# Patient Record
Sex: Female | Born: 1970 | Race: White | Hispanic: No | Marital: Married | Smoking: Former smoker
Health system: Southern US, Community
[De-identification: ages and names within clinical notes are randomized; demographics above are authoritative.]

## PROBLEM LIST (undated history)

## (undated) DIAGNOSIS — I10 Essential (primary) hypertension: Secondary | ICD-10-CM

## (undated) DIAGNOSIS — C50919 Malignant neoplasm of unspecified site of unspecified female breast: Secondary | ICD-10-CM

## (undated) DIAGNOSIS — I2510 Atherosclerotic heart disease of native coronary artery without angina pectoris: Secondary | ICD-10-CM

## (undated) DIAGNOSIS — K5792 Diverticulitis of intestine, part unspecified, without perforation or abscess without bleeding: Secondary | ICD-10-CM

## (undated) DIAGNOSIS — K449 Diaphragmatic hernia without obstruction or gangrene: Secondary | ICD-10-CM

## (undated) DIAGNOSIS — I447 Left bundle-branch block, unspecified: Secondary | ICD-10-CM

## (undated) DIAGNOSIS — F419 Anxiety disorder, unspecified: Secondary | ICD-10-CM

## (undated) DIAGNOSIS — E785 Hyperlipidemia, unspecified: Secondary | ICD-10-CM

## (undated) DIAGNOSIS — J189 Pneumonia, unspecified organism: Secondary | ICD-10-CM

## (undated) DIAGNOSIS — K219 Gastro-esophageal reflux disease without esophagitis: Secondary | ICD-10-CM

## (undated) DIAGNOSIS — E039 Hypothyroidism, unspecified: Secondary | ICD-10-CM

## (undated) DIAGNOSIS — T7840XA Allergy, unspecified, initial encounter: Secondary | ICD-10-CM

## (undated) DIAGNOSIS — E119 Type 2 diabetes mellitus without complications: Secondary | ICD-10-CM

## (undated) HISTORY — PX: TUBAL LIGATION: SHX77

## (undated) HISTORY — DX: Hyperlipidemia, unspecified: E78.5

## (undated) HISTORY — DX: Diaphragmatic hernia without obstruction or gangrene: K44.9

## (undated) HISTORY — DX: Pneumonia, unspecified organism: J18.9

## (undated) HISTORY — PX: WISDOM TOOTH EXTRACTION: SHX21

## (undated) HISTORY — DX: Allergy, unspecified, initial encounter: T78.40XA

## (undated) HISTORY — PX: OTHER SURGICAL HISTORY: SHX169

## (undated) HISTORY — DX: Gastro-esophageal reflux disease without esophagitis: K21.9

## (undated) HISTORY — DX: Diverticulitis of intestine, part unspecified, without perforation or abscess without bleeding: K57.92

---

## 2003-07-14 ENCOUNTER — Encounter: Admission: RE | Admit: 2003-07-14 | Discharge: 2003-07-14 | Payer: Self-pay | Admitting: Internal Medicine

## 2003-07-14 IMAGING — CR DG CHEST 2V
2 series · 2 of 2 positions shown · non-contrast
Comparison: none

CLINICAL DATA: Chest pain.
 TWO VIEW CHEST 
 The heart size and mediastinal contours are unremarkable.  The lungs are clear.  The visualized skeleton is unremarkable.
 IMPRESSION
 No active disease.

[view not recorded (1 of 2)]
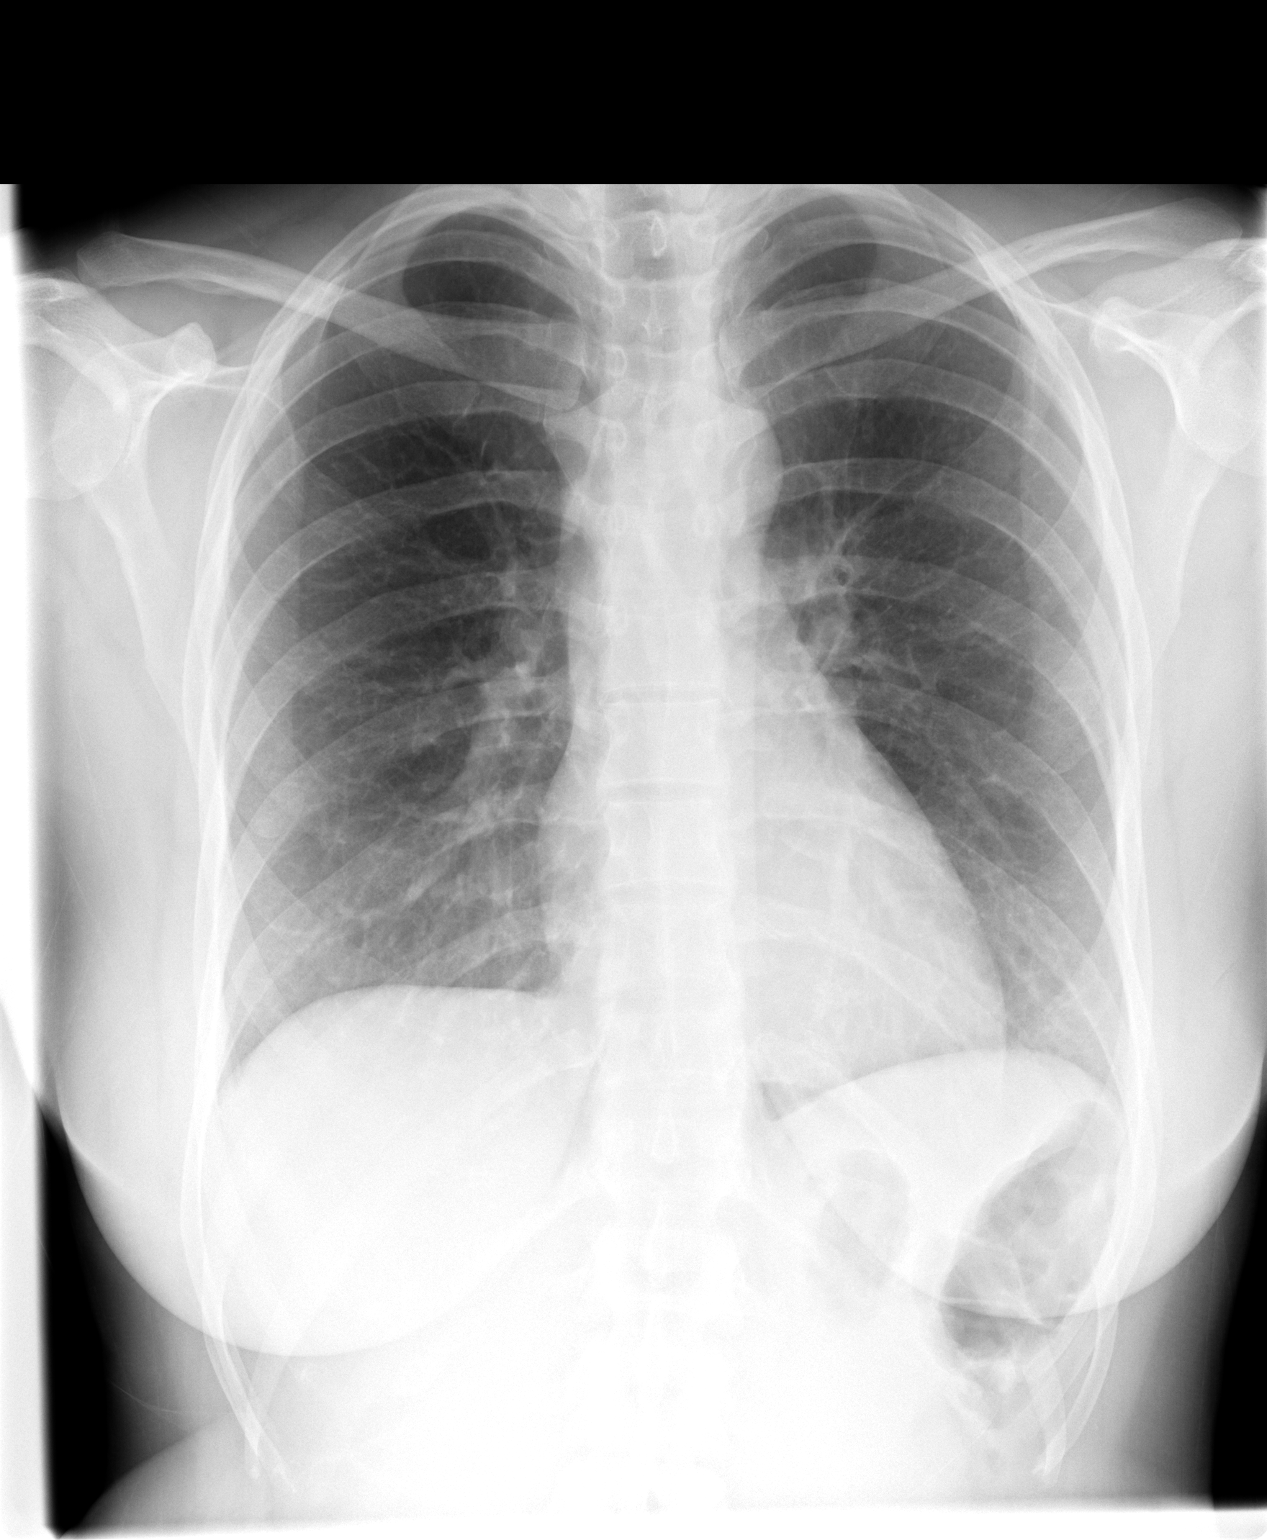

[view not recorded (2 of 2)]
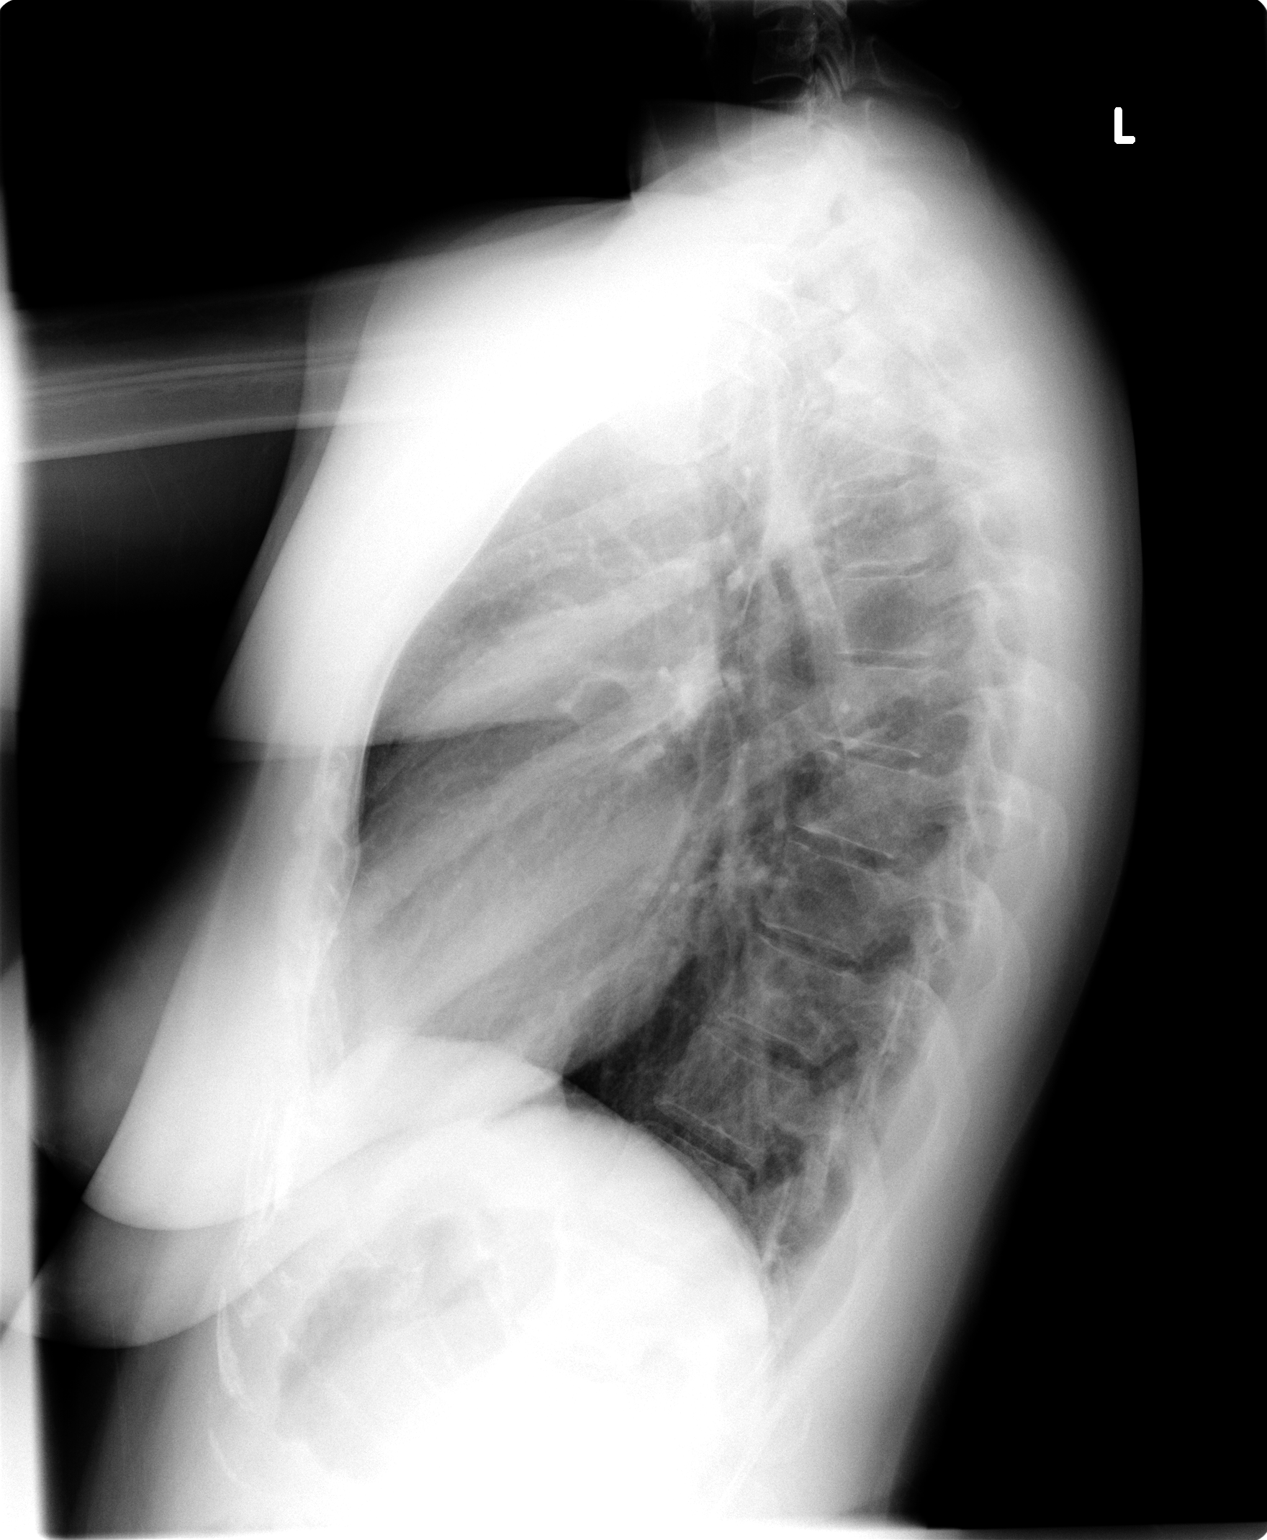

[2 of 2 positions shown; findings below may reference images not displayed]

## 2005-05-02 ENCOUNTER — Other Ambulatory Visit: Admission: RE | Admit: 2005-05-02 | Discharge: 2005-05-02 | Payer: Self-pay | Admitting: Obstetrics and Gynecology

## 2005-12-12 ENCOUNTER — Inpatient Hospital Stay (HOSPITAL_COMMUNITY): Admission: RE | Admit: 2005-12-12 | Discharge: 2005-12-15 | Payer: Self-pay | Admitting: Obstetrics and Gynecology

## 2007-04-09 ENCOUNTER — Encounter: Admission: RE | Admit: 2007-04-09 | Discharge: 2007-04-09 | Payer: Self-pay | Admitting: Obstetrics and Gynecology

## 2007-04-09 IMAGING — MG MM DIAGNOSTIC BILATERAL
4 series · 4 of 4 positions shown · non-contrast
Comparison: None.

DG DIAGNOSTIC BILATERAL
Bilateral CC and MLO view(s) were taken.

RIGHT BREAST ULTRASOUND
DIGITAL BILATERAL DIAGNOSTIC MAMMOGRAM WITH CAD AND RIGHT BREAST ULTRASOUND:
CLINICAL DATA: Pain and lump in the right breast.

[R CC]
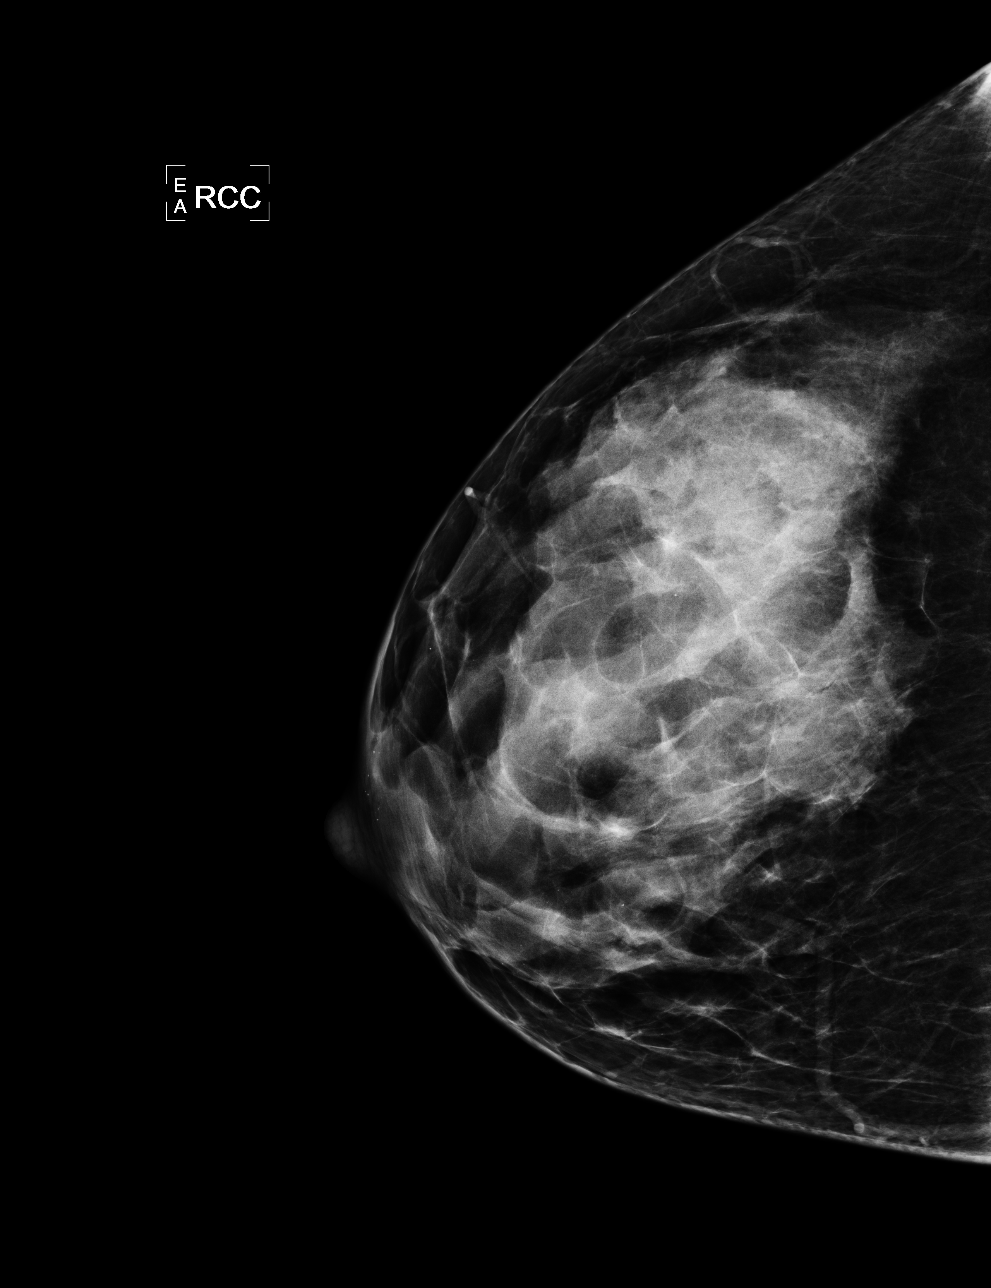

[L CC]
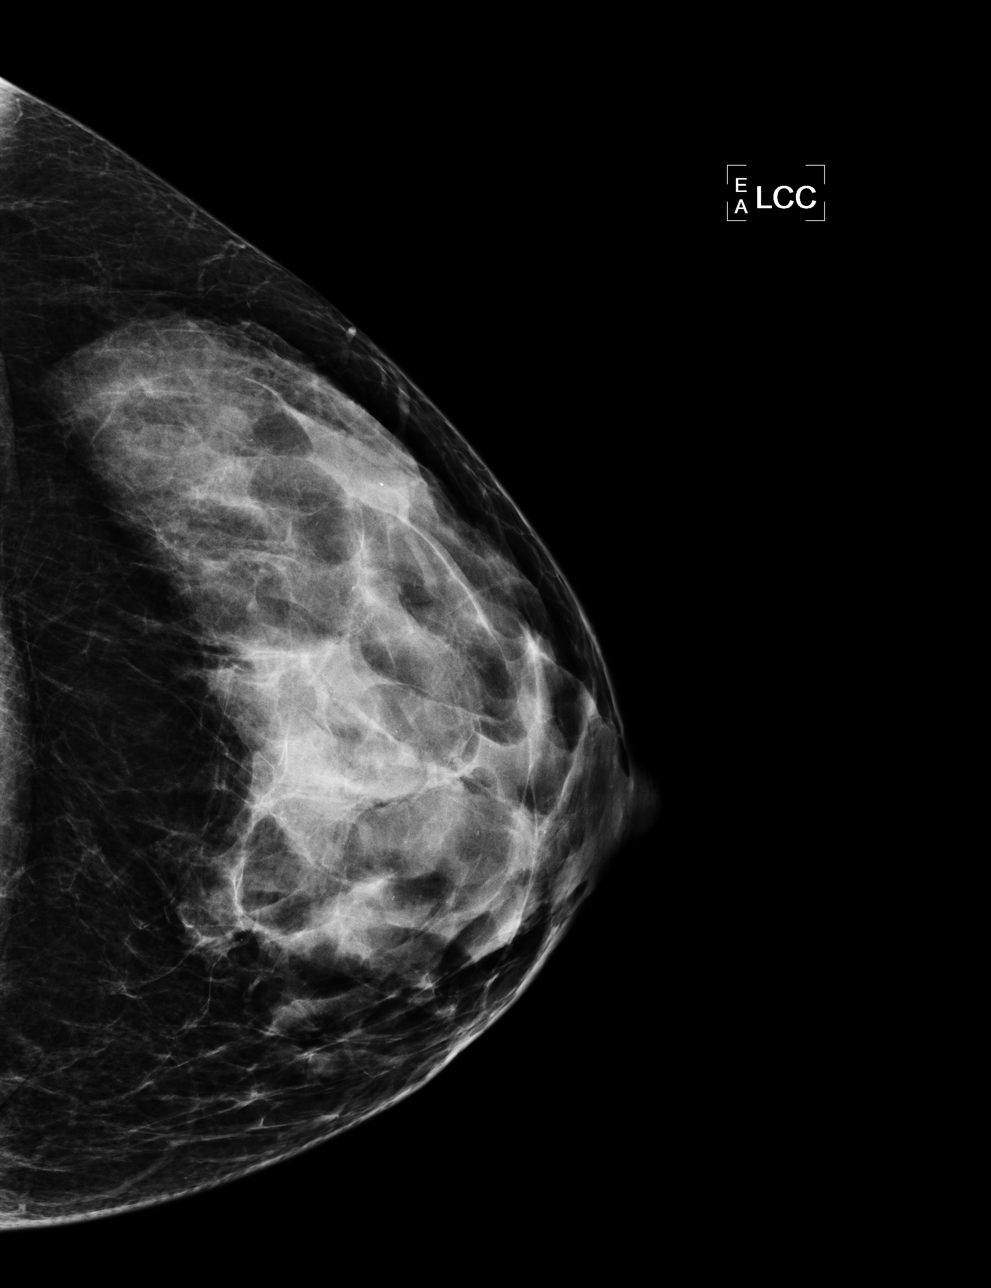

[L MLO]
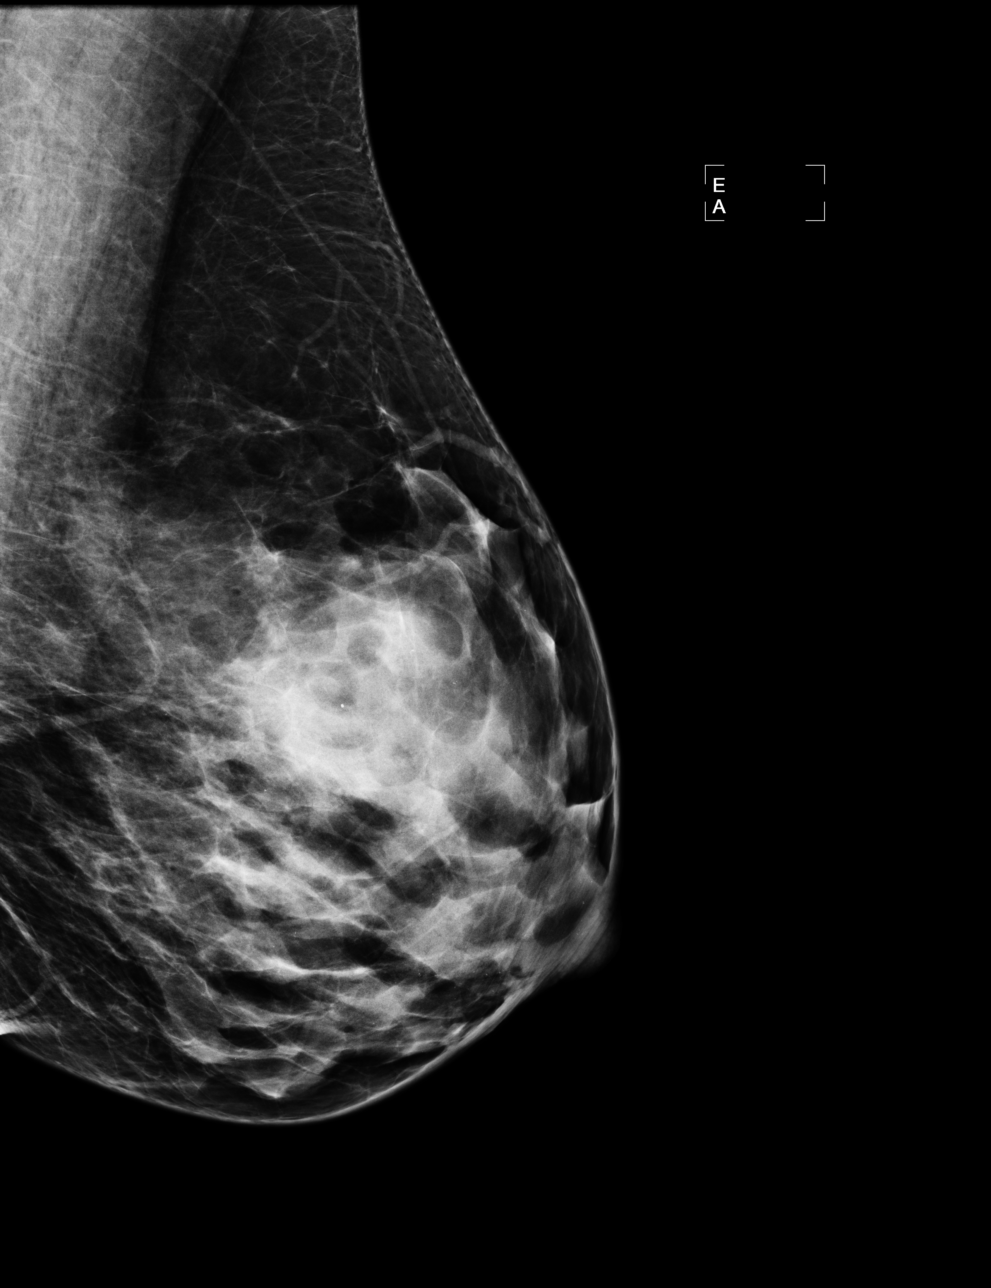

[R MLO]
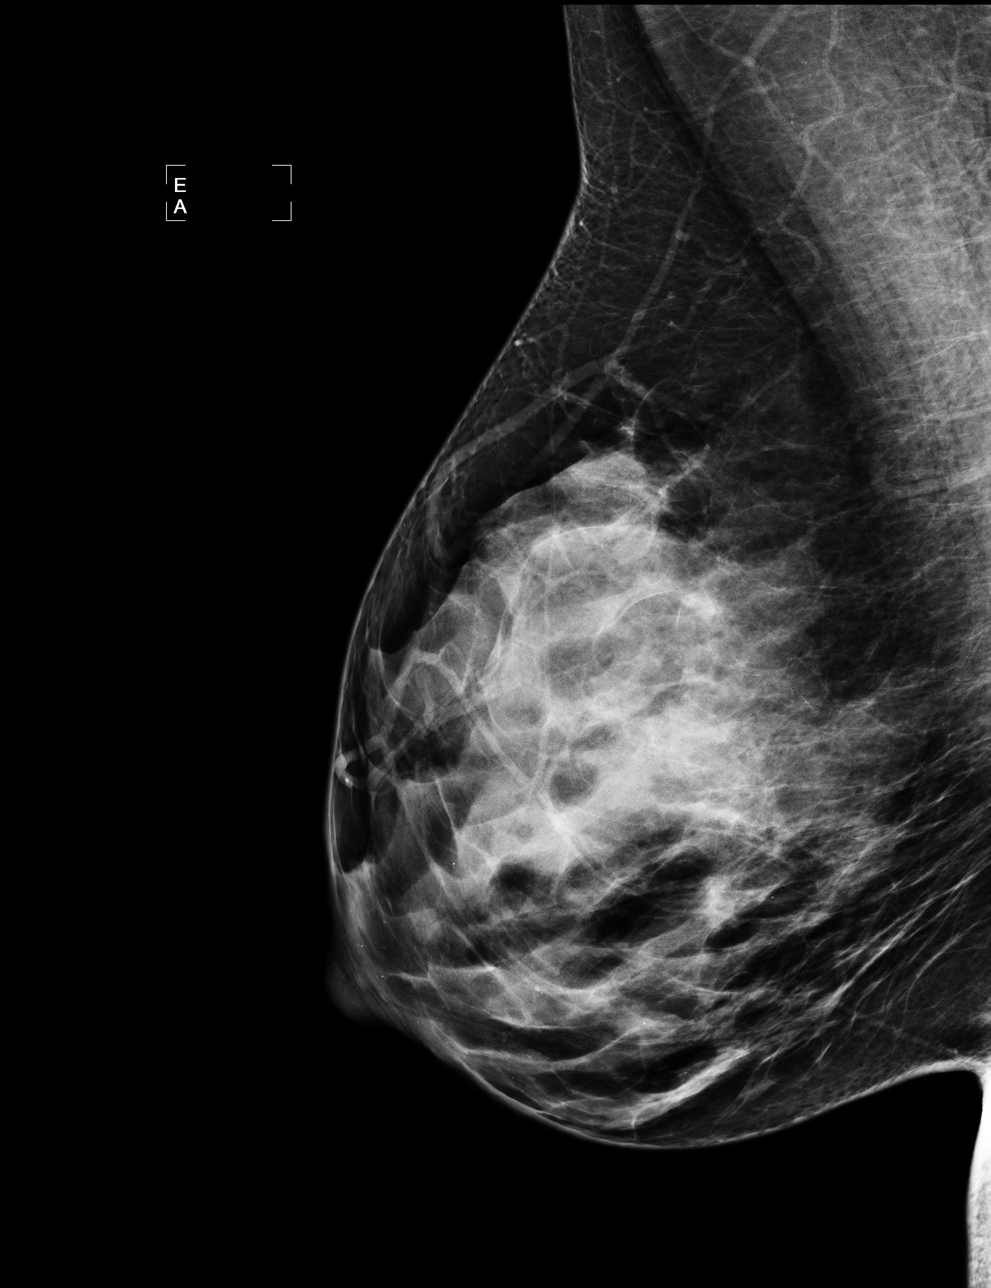

[4 of 4 positions shown; findings below may reference images not displayed]

CC and MLO views of bilateral breasts are submitted.  Breast parenchyma is heterogeneously dense 
lowering the sensitivity of mammography.  No focal asymmetry, architectural distortion or 
suspicious microcalcifications are identified in both breasts.

Targeted limited ultrasound is performed in the palpable area in the right breast 12 o'clock 
periareolar region as well as the area of pain in the upper outer quadrant right breast which 
demonstrated no focal cystic or solid lesion.
IMPRESSION: Negative.  Recommend routine screening mammogram at age 39. Recommend management on a clinical 
basis for the patient's right breast pain.

ASSESSMENT: Negative - BI-RADS 1

Screening mammogram of both breasts at age 39.
,

## 2007-12-28 ENCOUNTER — Encounter: Payer: Self-pay | Admitting: Cardiovascular Disease

## 2007-12-30 ENCOUNTER — Encounter: Payer: Self-pay | Admitting: Cardiovascular Disease

## 2007-12-30 HISTORY — PX: US ECHOCARDIOGRAPHY: HXRAD669

## 2008-03-29 HISTORY — PX: OTHER SURGICAL HISTORY: SHX169

## 2008-04-26 ENCOUNTER — Encounter (INDEPENDENT_AMBULATORY_CARE_PROVIDER_SITE_OTHER): Payer: Self-pay | Admitting: Plastic Surgery

## 2008-04-26 ENCOUNTER — Ambulatory Visit (HOSPITAL_BASED_OUTPATIENT_CLINIC_OR_DEPARTMENT_OTHER): Admission: RE | Admit: 2008-04-26 | Discharge: 2008-04-26 | Payer: Self-pay | Admitting: Plastic Surgery

## 2008-06-13 ENCOUNTER — Emergency Department (HOSPITAL_BASED_OUTPATIENT_CLINIC_OR_DEPARTMENT_OTHER): Admission: EM | Admit: 2008-06-13 | Discharge: 2008-06-13 | Payer: Self-pay | Admitting: Emergency Medicine

## 2008-08-14 ENCOUNTER — Emergency Department (HOSPITAL_BASED_OUTPATIENT_CLINIC_OR_DEPARTMENT_OTHER): Admission: EM | Admit: 2008-08-14 | Discharge: 2008-08-14 | Payer: Self-pay | Admitting: Emergency Medicine

## 2008-08-15 ENCOUNTER — Ambulatory Visit: Payer: Self-pay | Admitting: Diagnostic Radiology

## 2008-08-15 ENCOUNTER — Emergency Department (HOSPITAL_BASED_OUTPATIENT_CLINIC_OR_DEPARTMENT_OTHER): Admission: EM | Admit: 2008-08-15 | Discharge: 2008-08-15 | Payer: Self-pay | Admitting: Emergency Medicine

## 2008-08-15 IMAGING — US US ABDOMEN COMPLETE
1 series · 14 of 25 positions shown · non-contrast
Comparison: None available.

CLINICAL DATA: Epigastric pain.  Left side chest pain.

COMPLETE ABDOMINAL ULTRASOUND

[Series 1: us abdomen complete · 0.32mm/px · 14 of 84 slices shown]
[im 1/84]
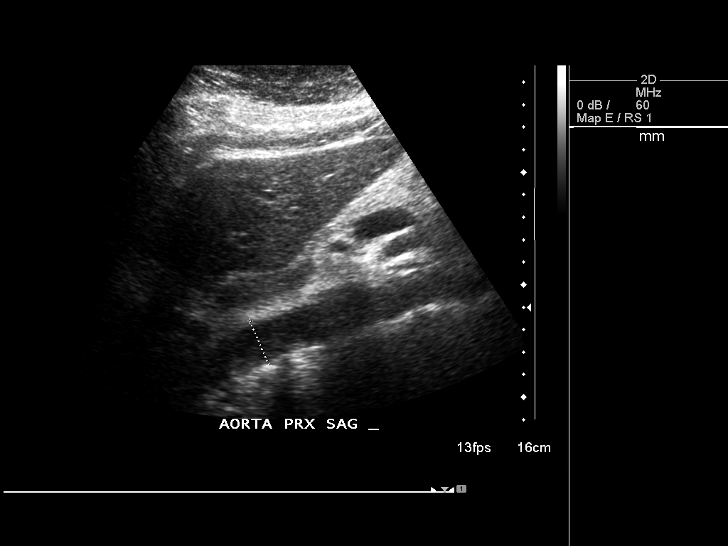
[im 7/84]
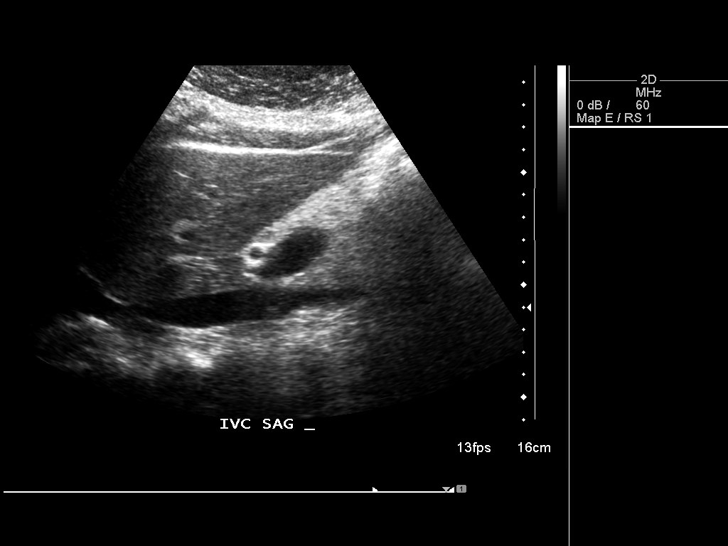
[im 14/84]
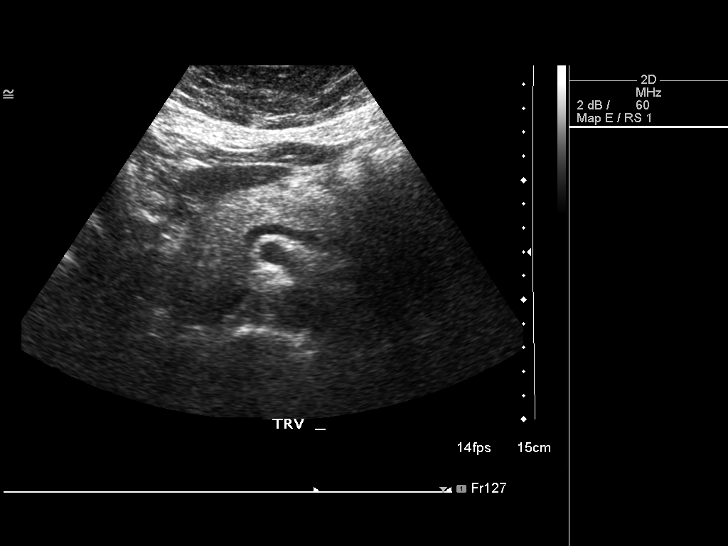
[im 21/84]
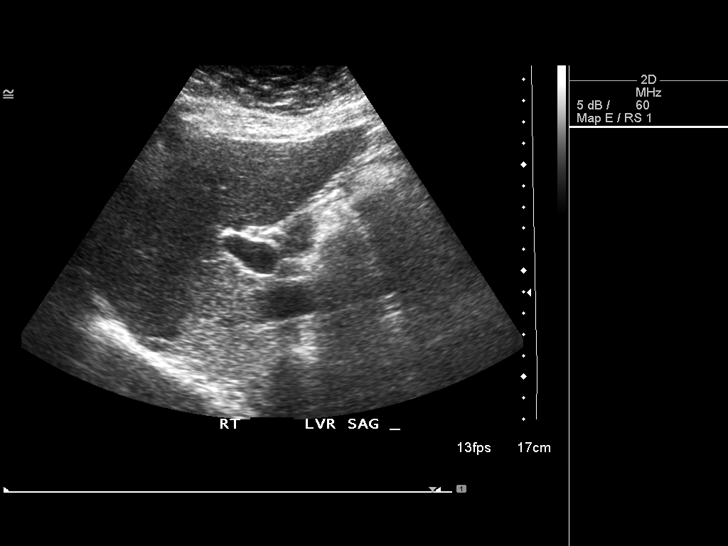
[im 28/84]
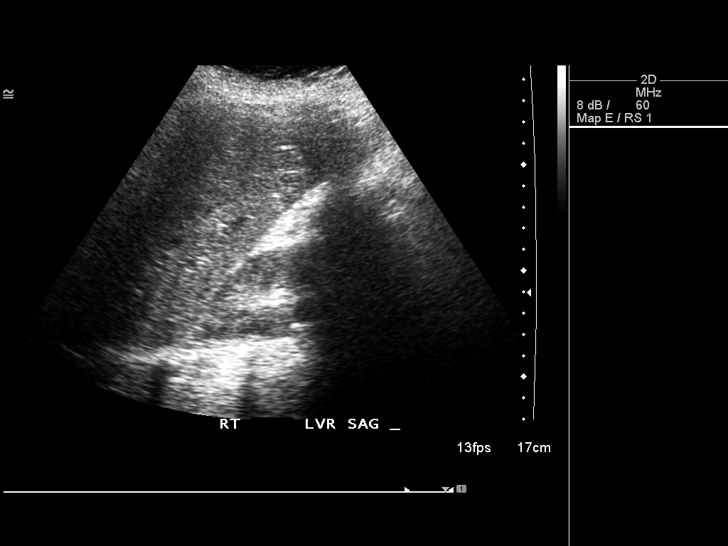
[im 32/84]
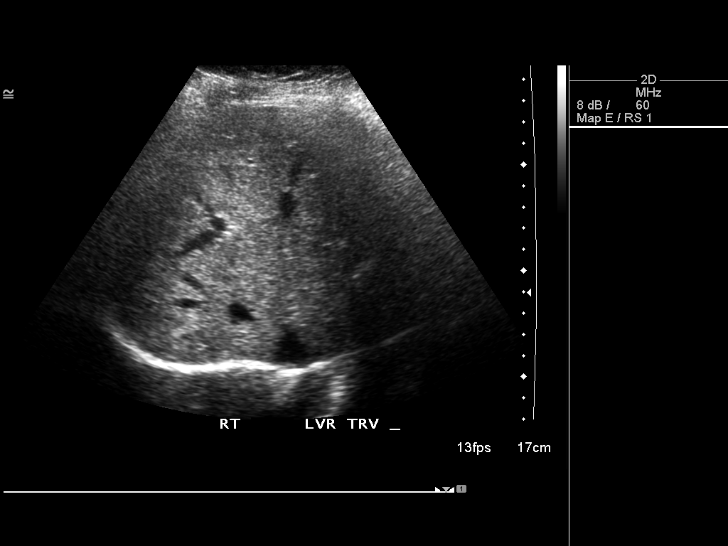
[im 39/84]
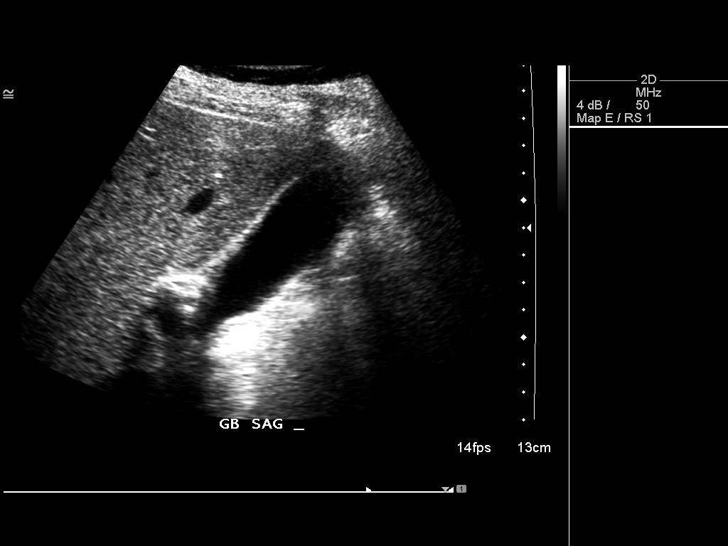
[im 45/84]
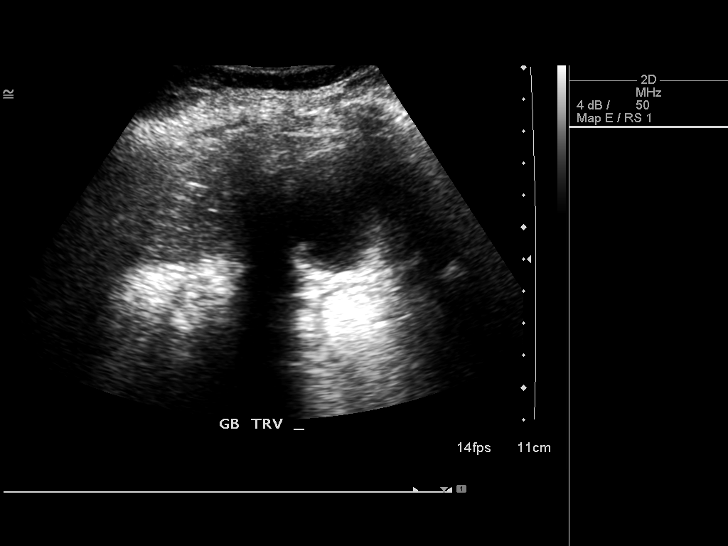
[im 52/84]
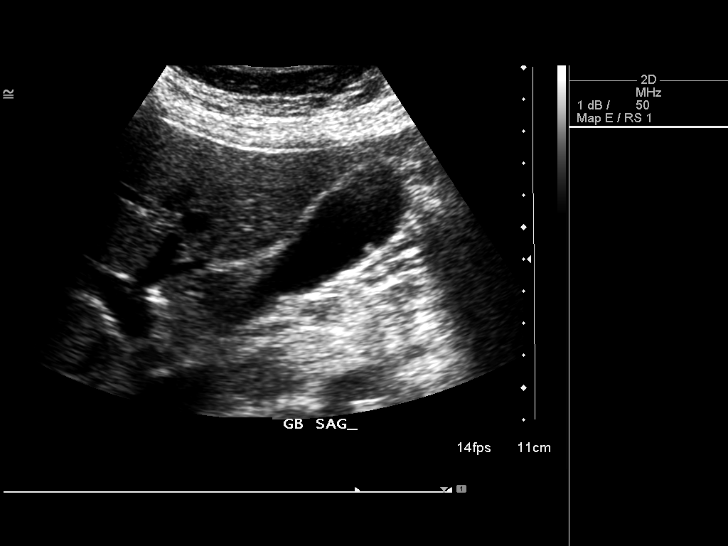
[im 56/84]
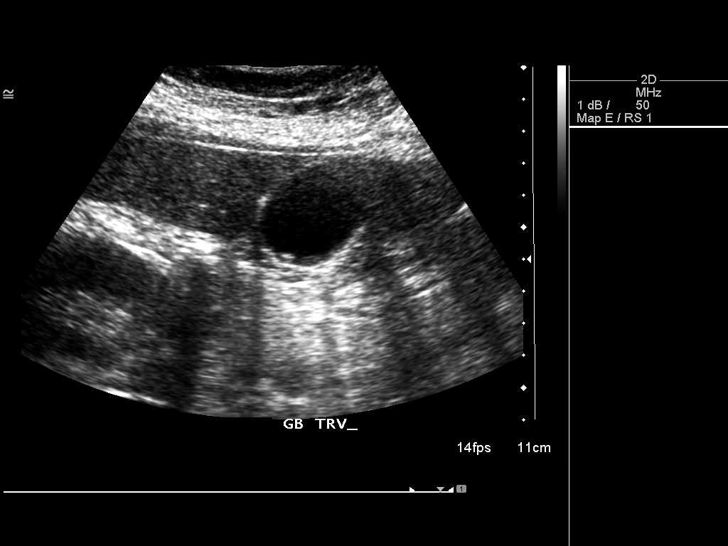
[im 63/84]
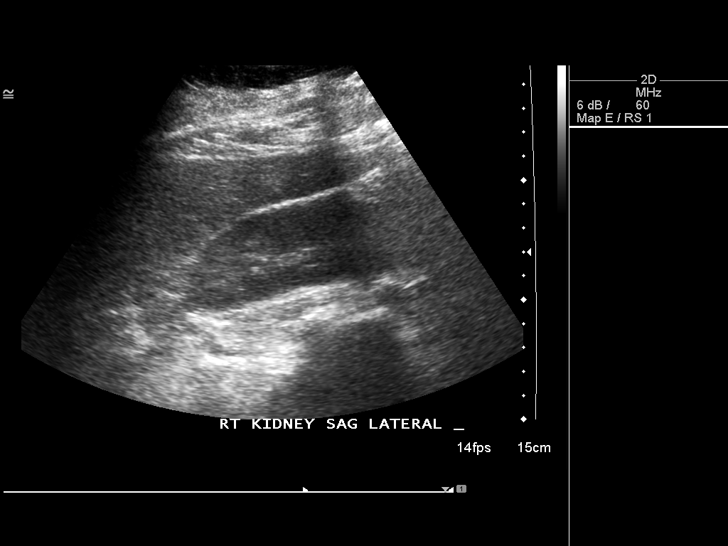
[im 70/84]
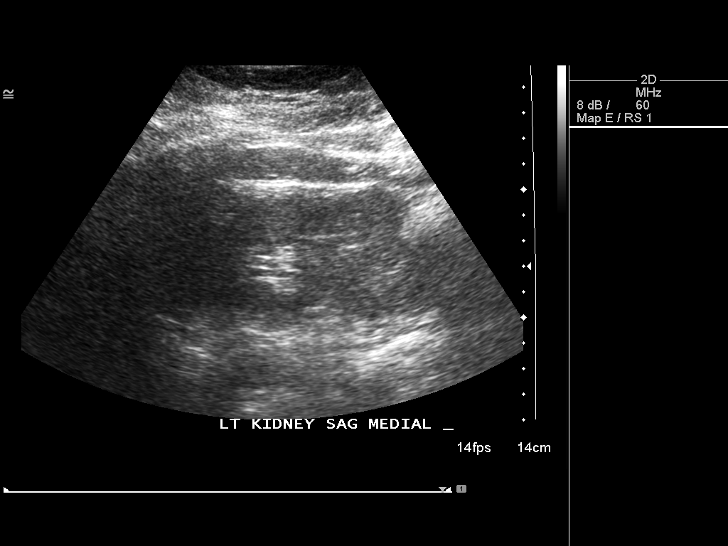
[im 77/84]
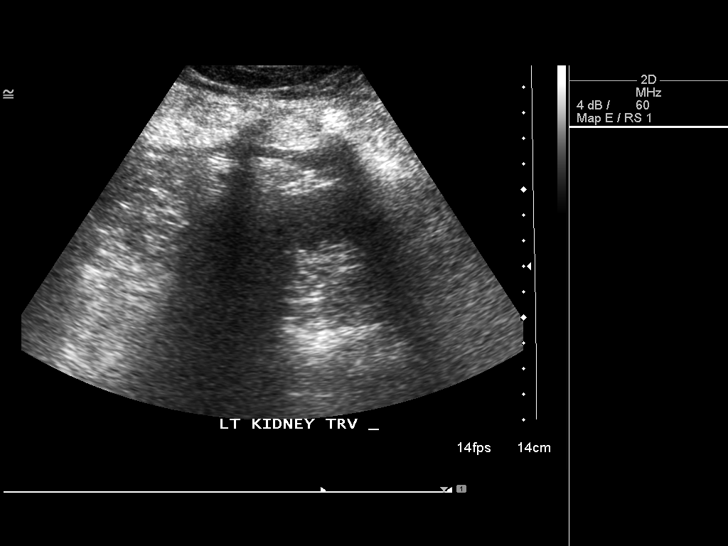
[im 84/84]
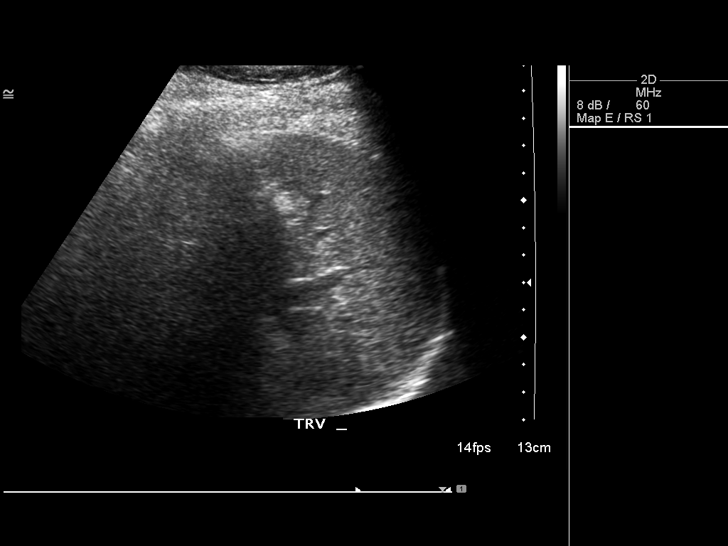

[14 of 25 positions shown; findings below may reference images not displayed]

FINDINGS: Gallbladder:  A 0.4 cm hyperechoic structure along the posterior
wall of the gallbladder near the fundus is compatible with a polyp.
No stones, wall thickening or pericholecystic fluid is identified.
Sonographer reports negative Murphy's sign.

Common bile duct:  Normal measuring 2.9 mm.

Liver:  Normal.

IVC:  Normal.

Pancreas:  Normal.

Spleen:  Normal measures 10.4 cm.

Right Kidney:  Measures 11.5 cm.  No stones, mass or
hydronephrosis.

Left Kidney:  Measures 10.9 cm.  No stones, mass or hydronephrosis.

Abdominal aorta:  Maximal diameter 2.2 cm.  No aneurysm.
IMPRESSION: 1.  0.4 cm gallbladder polyp is noted.  There is no evidence of
cholecystitis and the patient's examination is otherwise negative.

## 2008-08-15 IMAGING — CT CT ANGIO CHEST
2 of 6 series · 18 of 36 positions shown · IV contrast (APPLIED)
Comparison: Chest radiograph [DATE].

CLINICAL DATA: 37-year-old female with chest pain.  Elevated D-
dimer.  The patient is 18 weeks pregnant, and was shielded for the
exam.

CT ANGIOGRAPHY CHEST
TECHNIQUE: Multidetector CT imaging of the chest was performed
using the standard protocol during bolus administration of
intravenous contrast. Multiplanar CT image reconstructions
including MIPs were obtained to evaluate the vascular anatomy.
Contrast: 80 ml [UD].

[Series 5: pe 1.0 b25f · axial · 0.63mm/px · z∈[-231,-28]mm · 17 of 227 slices shown]
[im 12/227  lung]
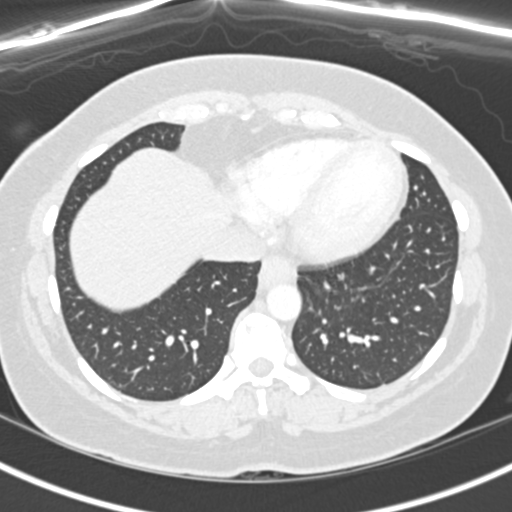
[im 23/227  mediastinal]
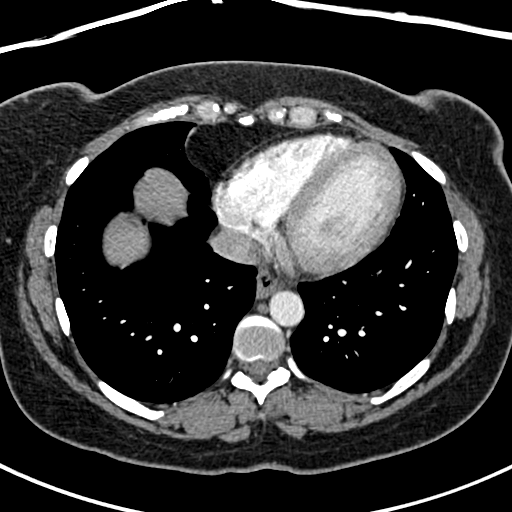
[im 34/227  lung]
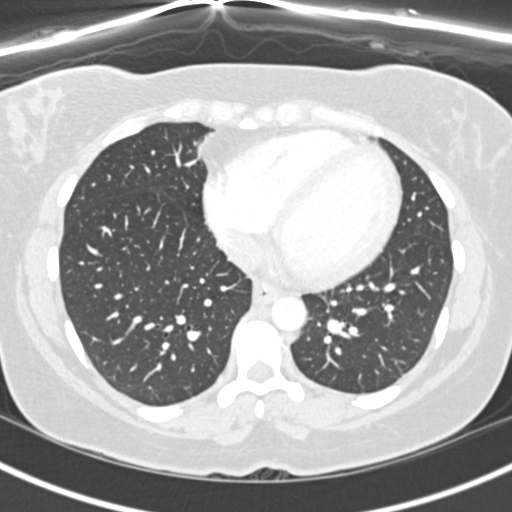
[im 46/227  mediastinal]
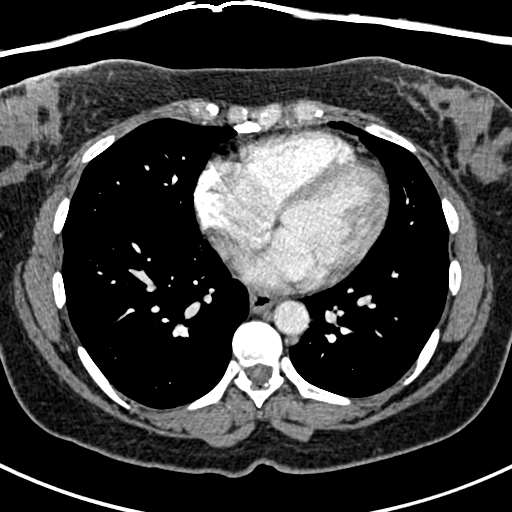
[im 68/227  lung]
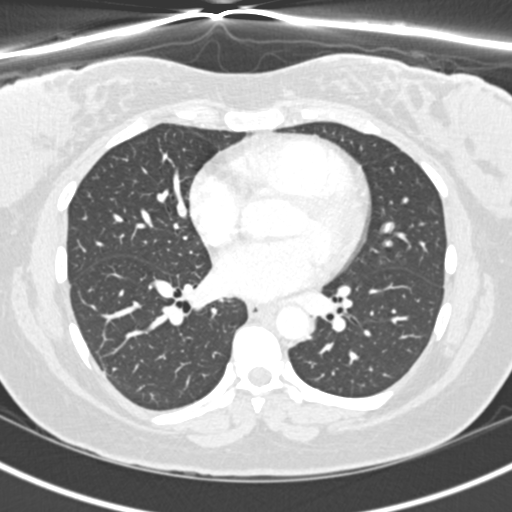
[im 80/227  mediastinal]
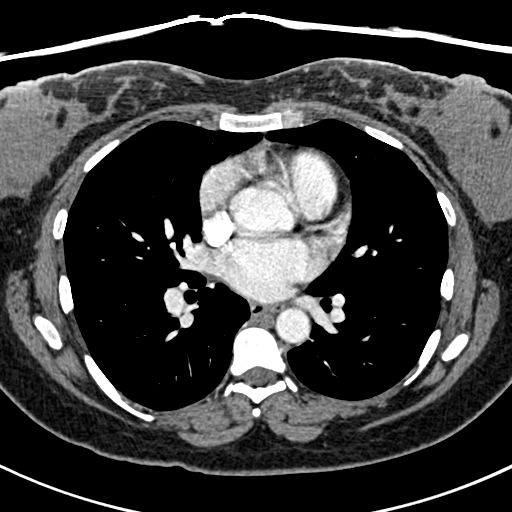
[im 91/227  lung]
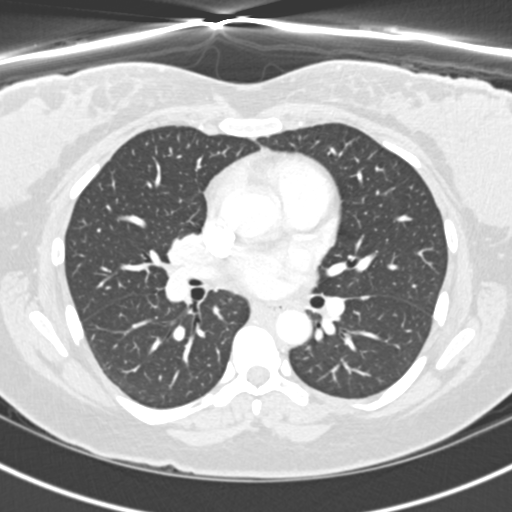
[im 102/227  mediastinal]
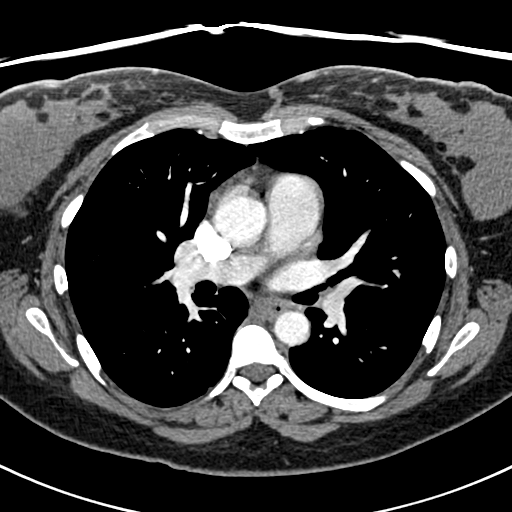
[im 114/227  lung]
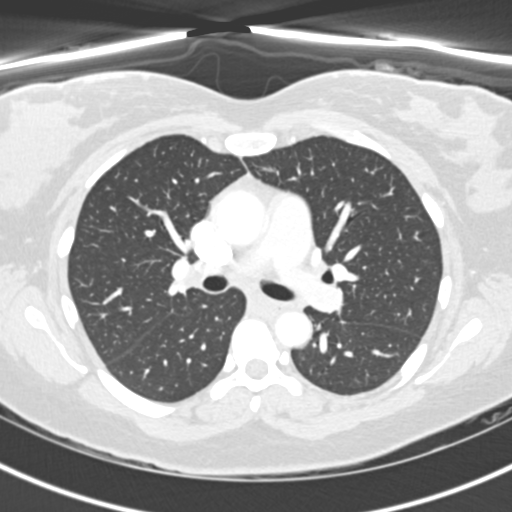
[im 125/227  mediastinal]
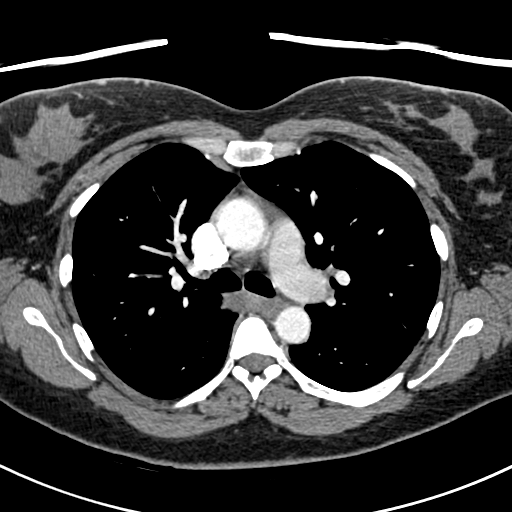
[im 136/227  lung]
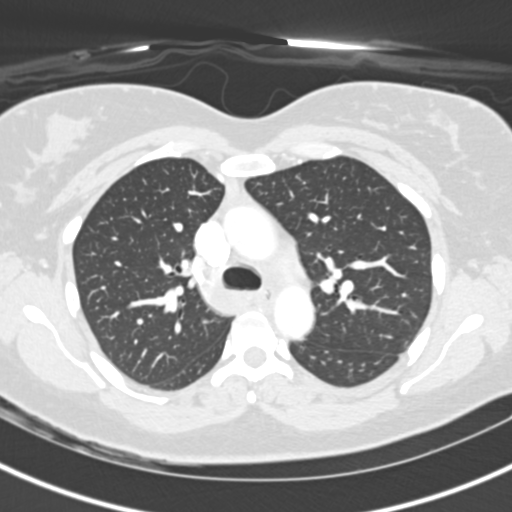
[im 147/227  mediastinal]
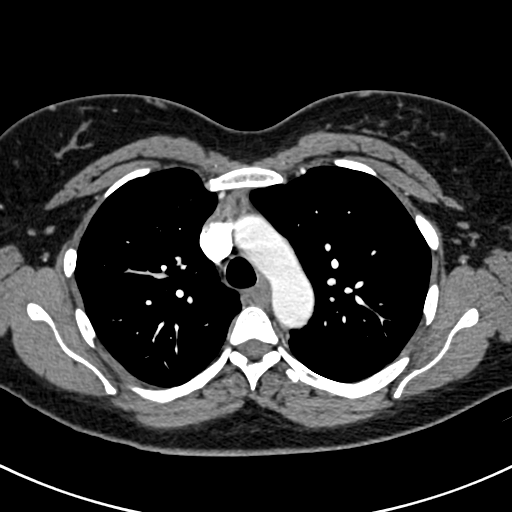
[im 159/227  lung]
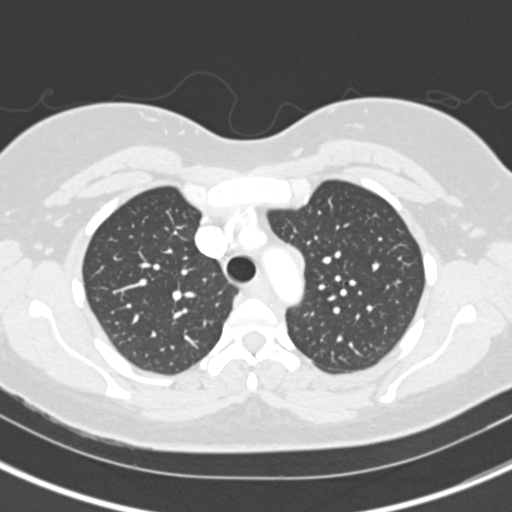
[im 181/227  mediastinal]
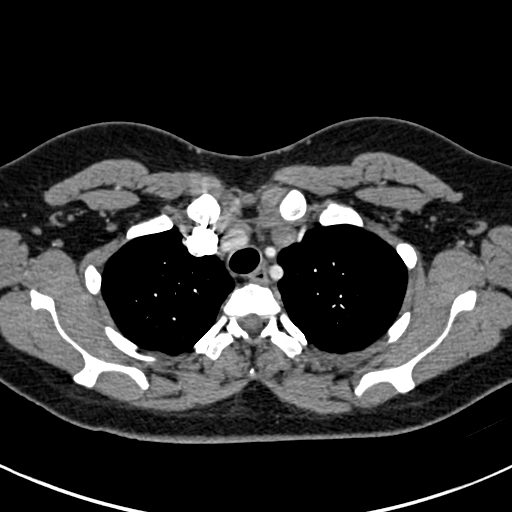
[im 193/227  lung]
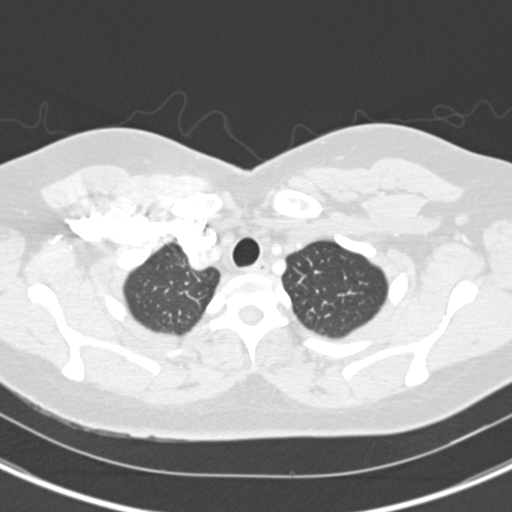
[im 204/227  mediastinal]
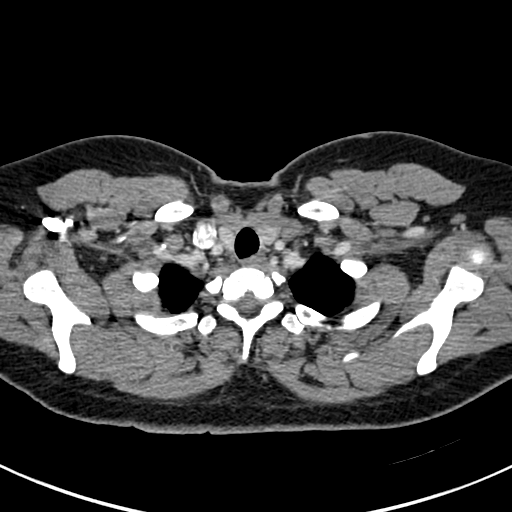
[im 215/227  lung]
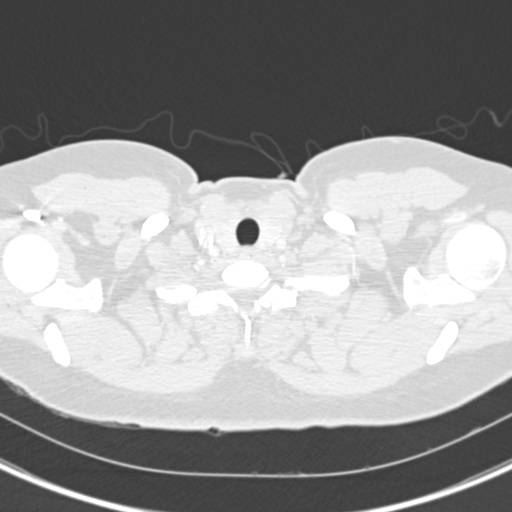

[Series 8: pe 2.0 coronal · coronal · 0.46mm/px · 1 of 123 slices shown]
[im 62/123  mediastinal]
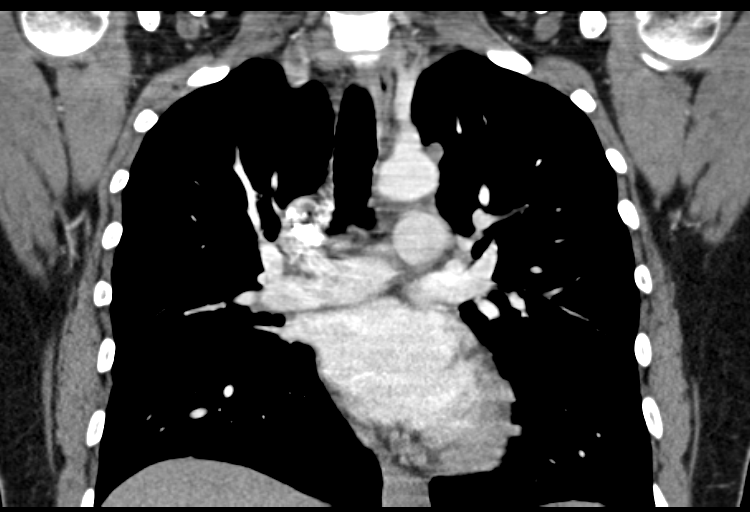

[18 of 36 positions shown; findings below may reference images not displayed]

FINDINGS: Suboptimal contrast bolus timing in the pulmonary
arterial tree.  Overall, diagnostic quality is satisfactory,
without respiratory motion artifact.No focal filling defect
identified in the pulmonary arterial tree to suggest the presence
of acute pulmonary embolism.

Major airways are patent.  Probably incidental 2-3 mm subpleural
nodule in the right upper lobe on series 6 image 44.  No airspace
consolidation.  Minor dependent atelectasis.  Otherwise, visualized
lung parenchyma is within normal limits.

Visualized thoracic inlet is within normal limits.  No pericardial
or pleural effusion.  No lymphadenopathy.  Visualized aorta is
within normal limits.

No acute osseous abnormality identified.

 Review of the MIP images confirms the above findings.
IMPRESSION: 1. No evidence of acute pulmonary embolus.
2. No acute cardiopulmonary abnormality.
3.  Likely incidental right upper lobe subpleural 2-3 mm nodule.
If the patient is at high risk for bronchogenic carcinoma, follow-
up chest CT at 1 year is recommended.  If the patient is at low
risk, no follow-up is needed.  This recommendation follows the
consensus statement: "Guidelines for Management of Small Pulmonary
Nodules Detected on CT Scans:  A Statement from the [employer]
online at:  [URL]

## 2008-12-29 ENCOUNTER — Inpatient Hospital Stay (HOSPITAL_COMMUNITY): Admission: AD | Admit: 2008-12-29 | Discharge: 2009-01-04 | Payer: Self-pay | Admitting: Obstetrics and Gynecology

## 2008-12-30 ENCOUNTER — Encounter (INDEPENDENT_AMBULATORY_CARE_PROVIDER_SITE_OTHER): Payer: Self-pay | Admitting: Obstetrics and Gynecology

## 2009-09-27 ENCOUNTER — Encounter: Admission: RE | Admit: 2009-09-27 | Discharge: 2009-09-27 | Payer: Self-pay | Admitting: Internal Medicine

## 2009-09-27 IMAGING — US US ABDOMEN COMPLETE
1 series · 14 of 25 positions shown · non-contrast
Comparison: [DATE]

CLINICAL DATA: Follow-up gallbladder polyp.

COMPLETE ABDOMINAL ULTRASOUND

[Series 1: us abdomen complete · 0.39mm/px · 14 of 68 slices shown]
[im 1/68]
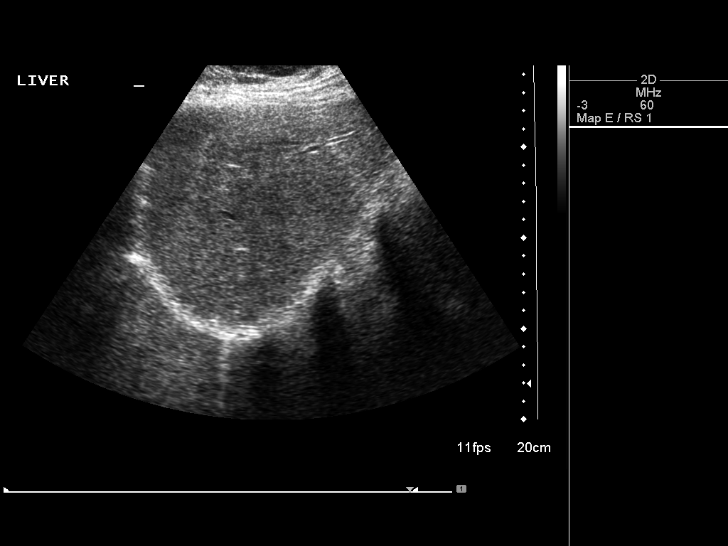
[im 6/68]
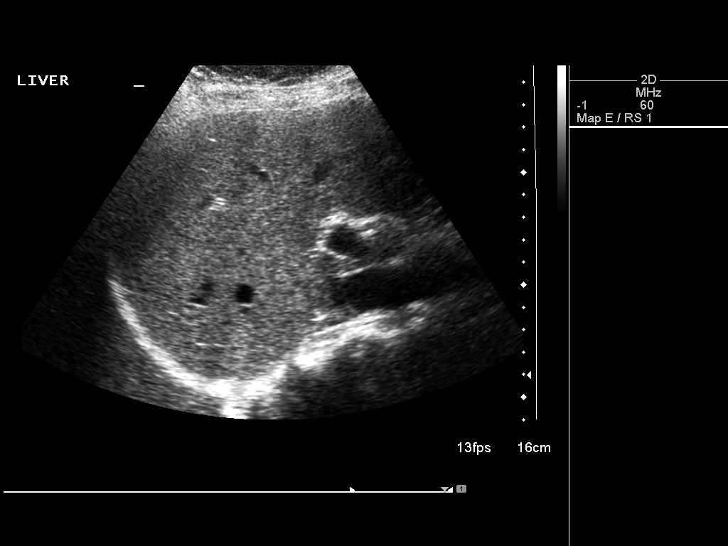
[im 12/68]
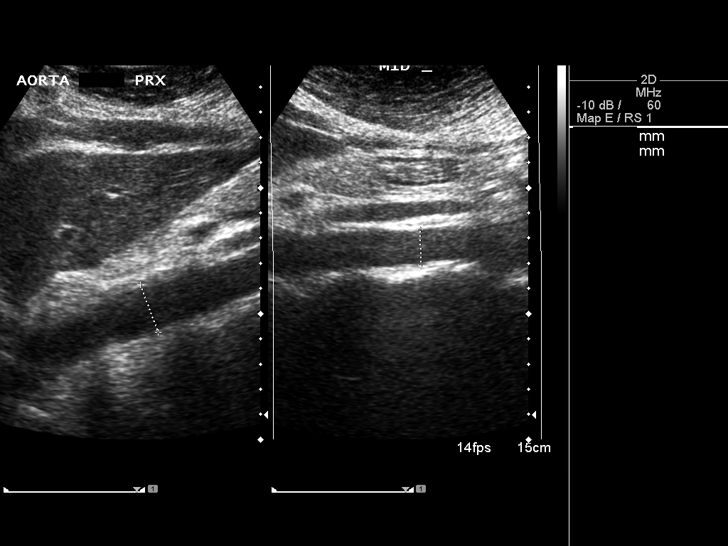
[im 17/68]
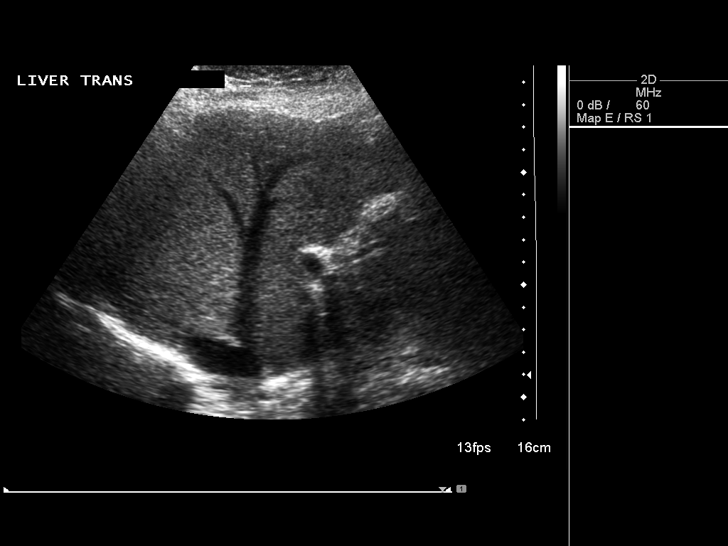
[im 23/68]
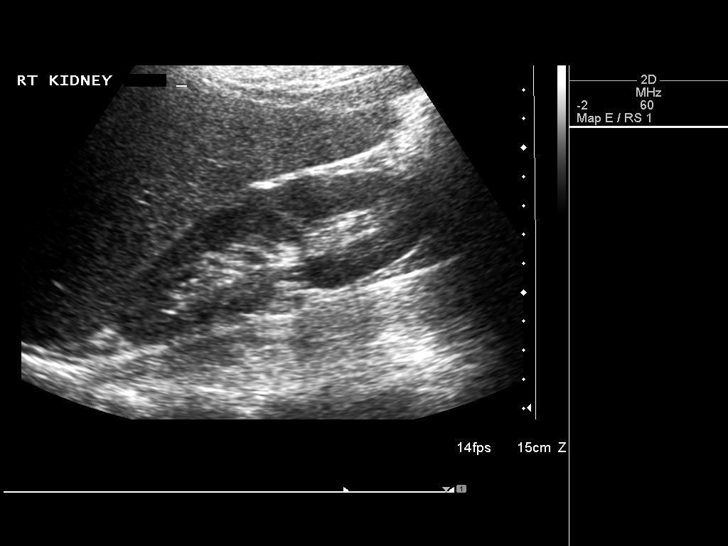
[im 26/68]
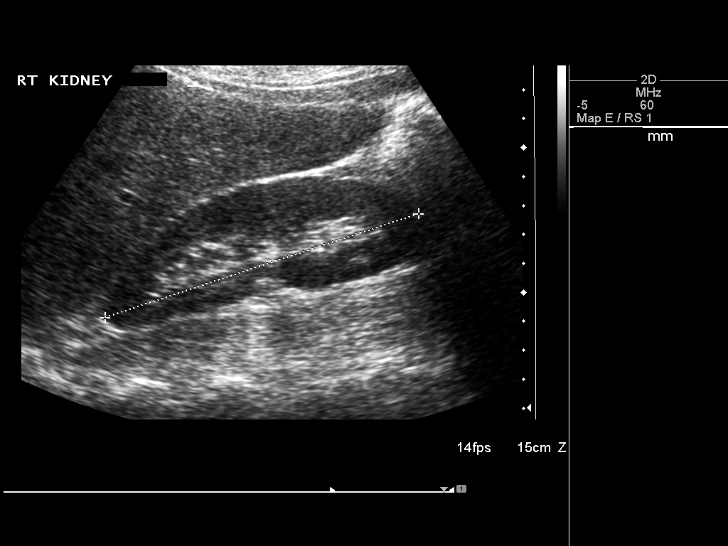
[im 31/68]
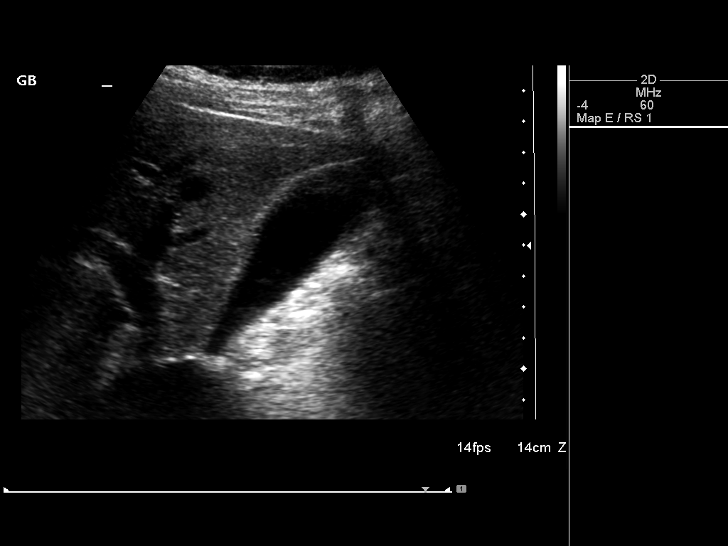
[im 37/68]
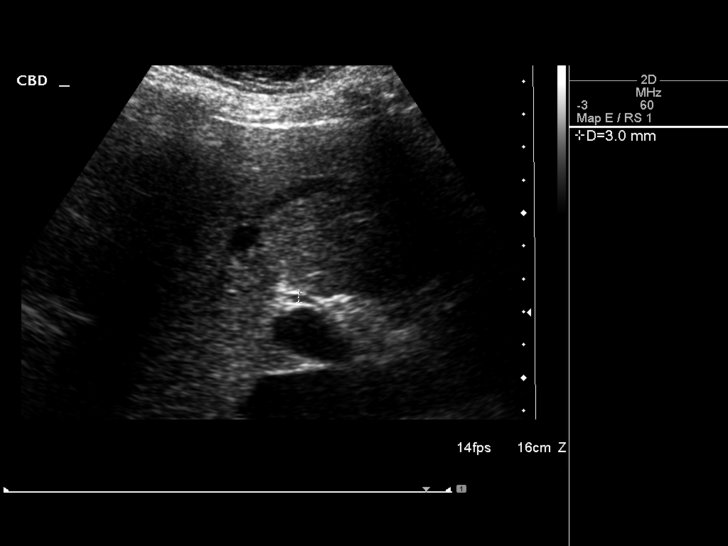
[im 42/68]
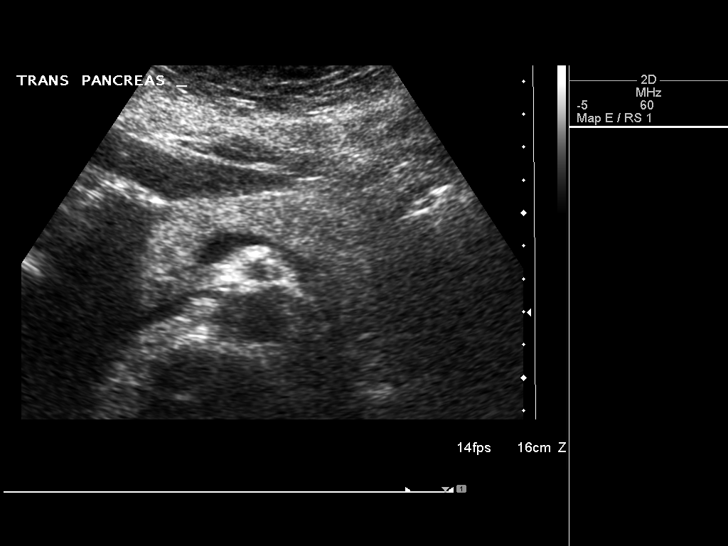
[im 45/68]
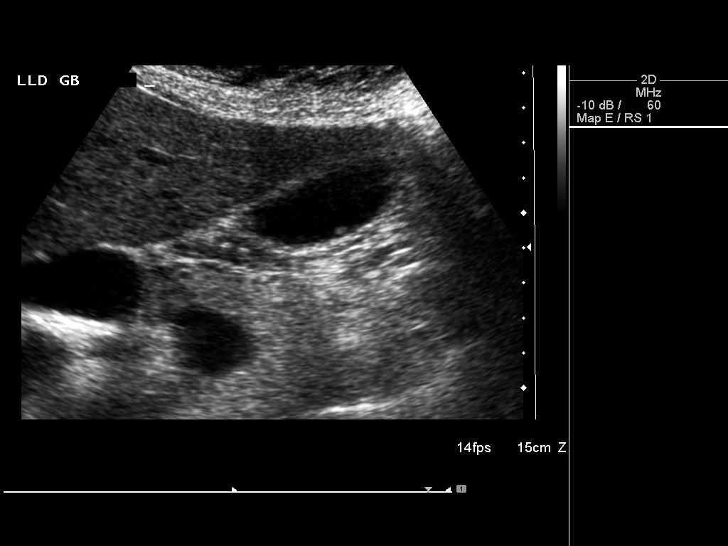
[im 51/68]
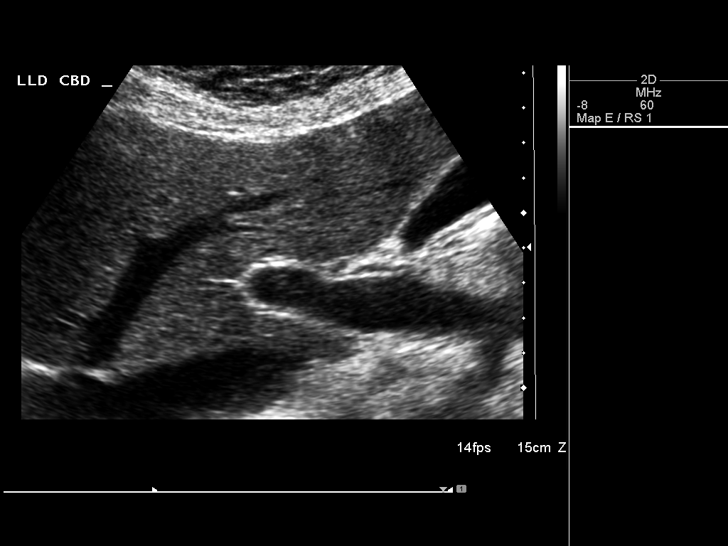
[im 56/68]
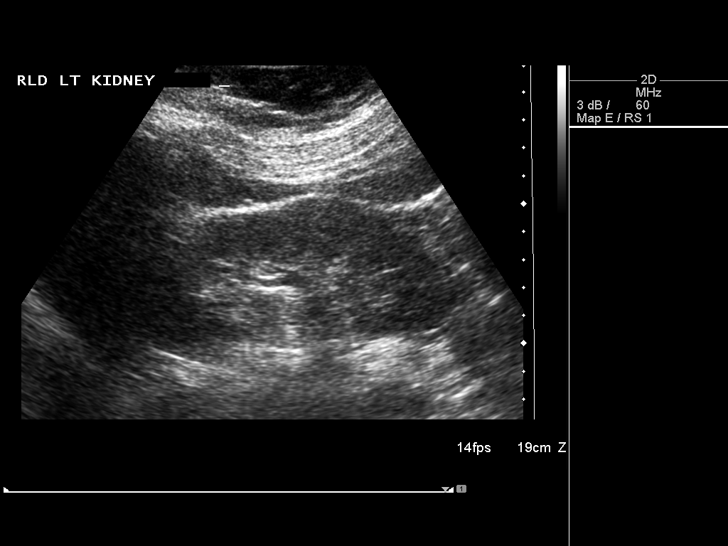
[im 62/68]
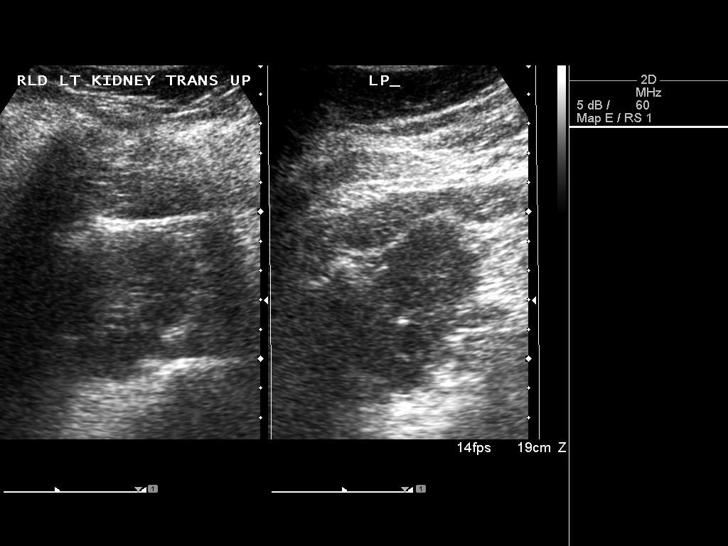
[im 68/68]
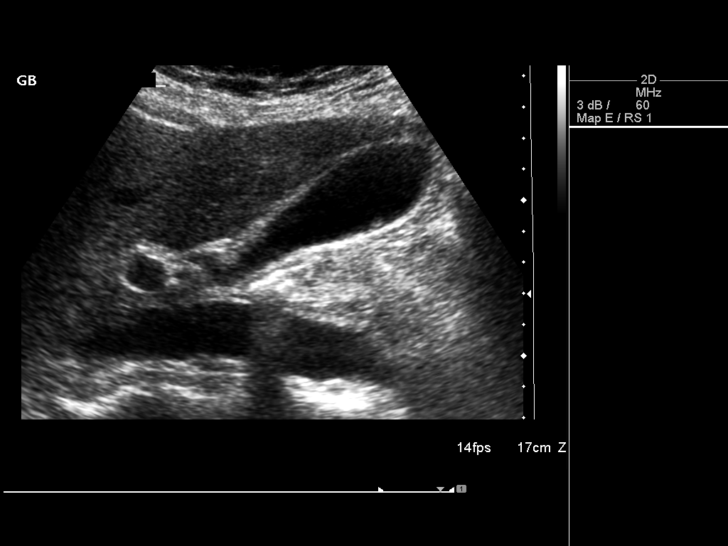

[14 of 25 positions shown; findings below may reference images not displayed]

FINDINGS: Gallbladder:  A gallbladder polyp measures 3 mm, stable.
Gallbladder wall measures 1 mm.

Common bile duct:  Measures 3 mm, within normal limits.

Liver:  Negative.

IVC:  Visualized.

Pancreas:  Negative.

Spleen:  Measures 9.7 cm, negative.

Right Kidney:  Measures 12.3 cm, negative.

Left Kidney:  Measures 12.1 cm, negative.

Abdominal aorta:  No aneurysm identified.
IMPRESSION: No acute findings.

## 2009-09-27 IMAGING — CR DG CHEST 2V
2 series · 2 of 2 positions shown · non-contrast
Comparison: CT chest [DATE] and chest x-ray [DATE]

CLINICAL DATA: Cough.

CHEST - 2 VIEW

[w chest pa]
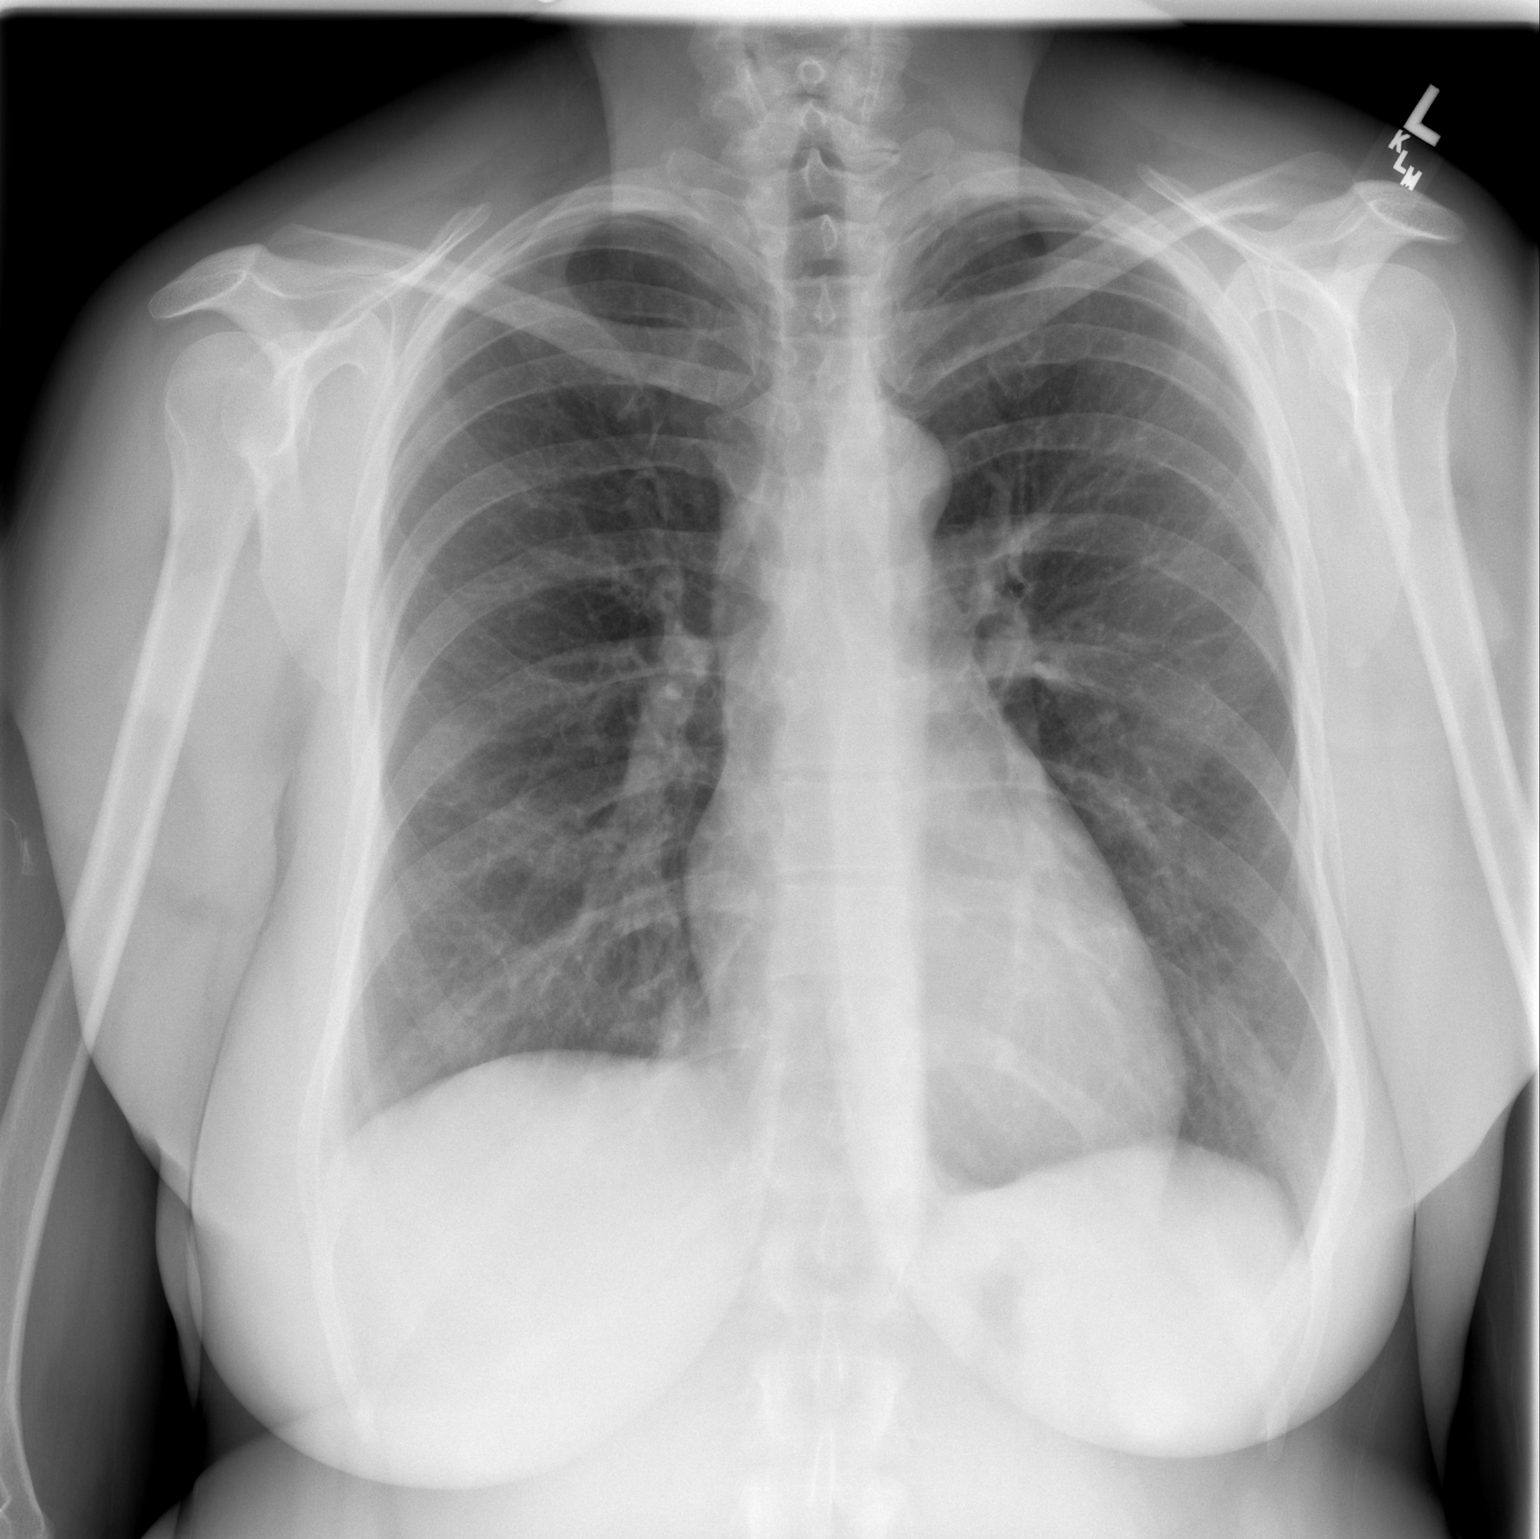

[w chest lat]
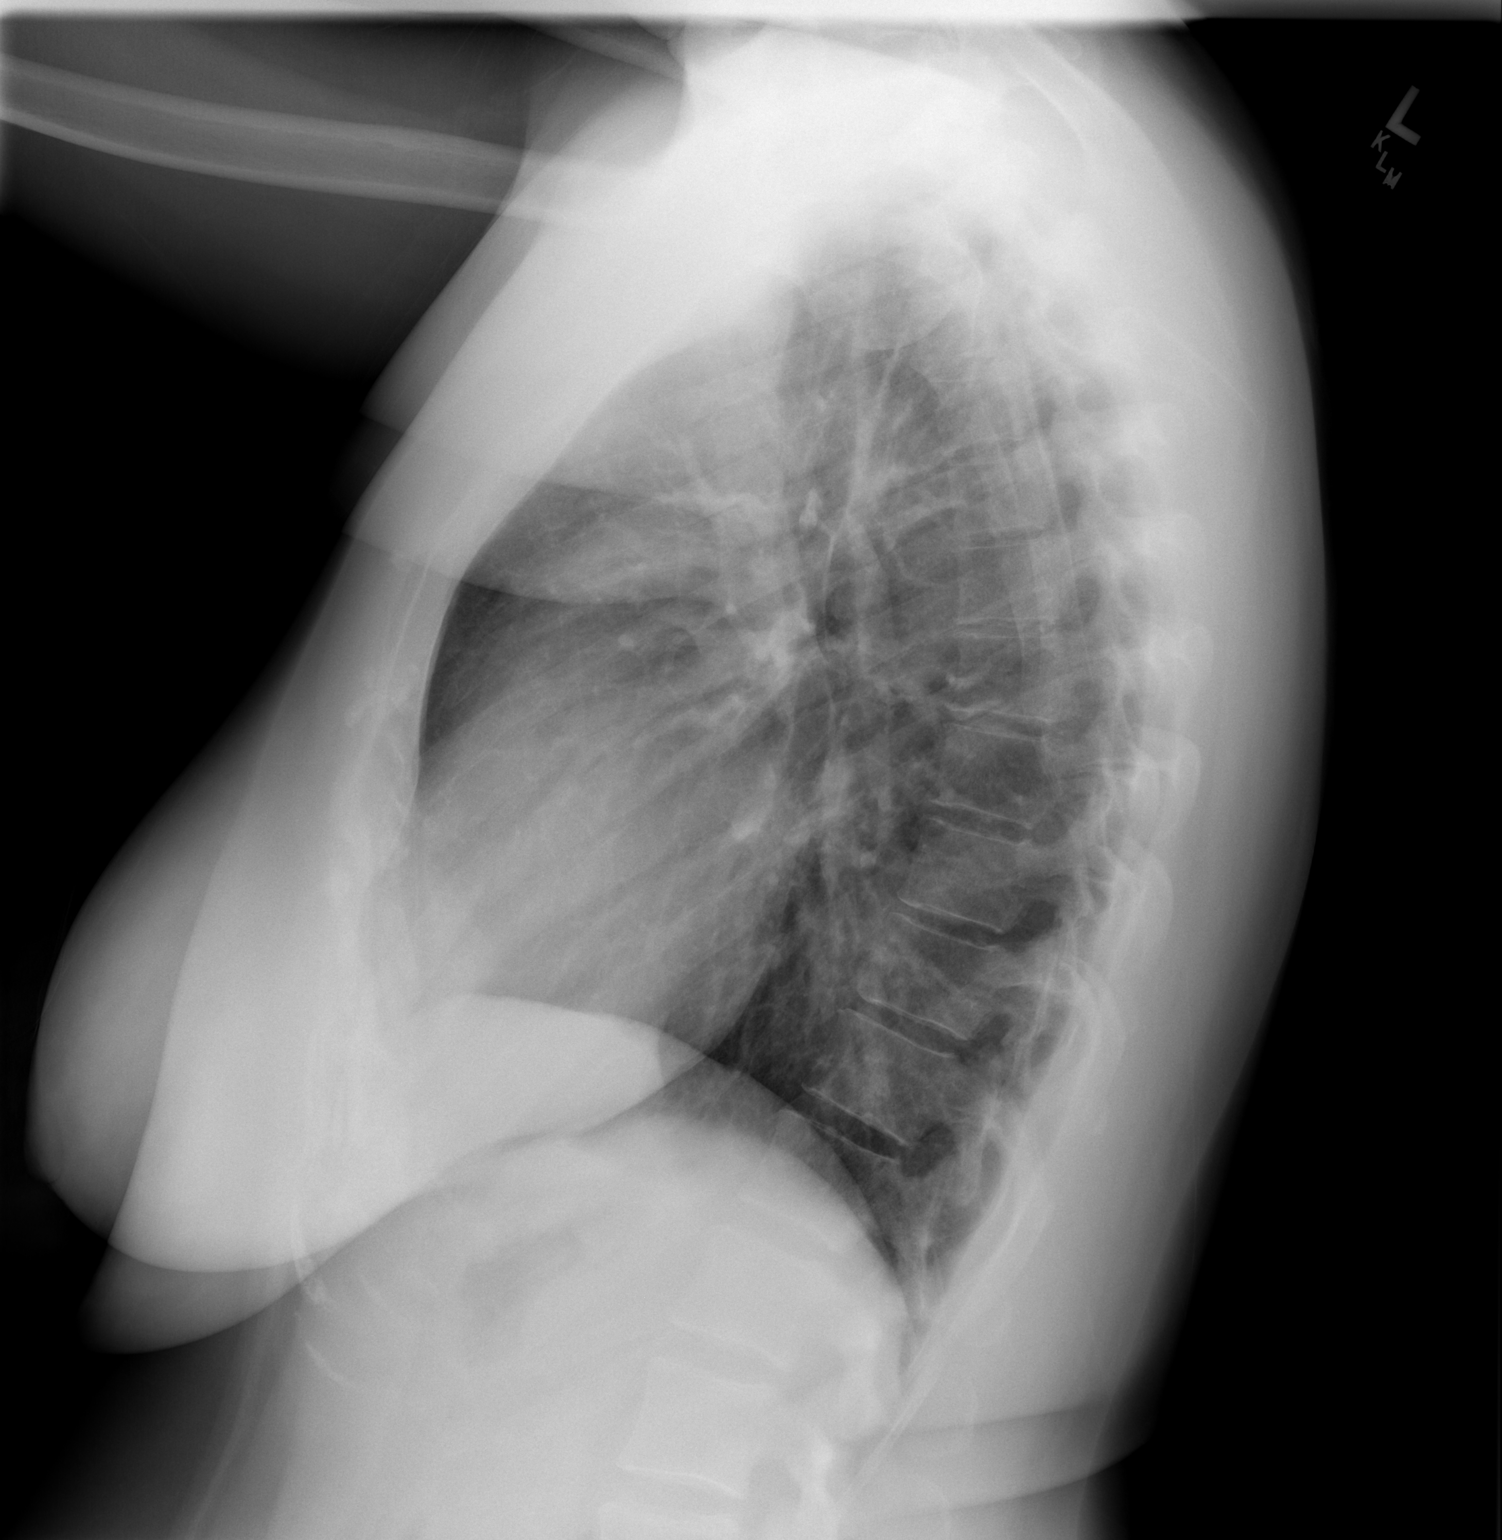

[2 of 2 positions shown; findings below may reference images not displayed]

FINDINGS: Trachea is midline.  Heart size normal.  Lungs are clear.
No pleural fluid.
IMPRESSION: No acute findings.

## 2010-01-04 ENCOUNTER — Encounter: Admission: RE | Admit: 2010-01-04 | Discharge: 2010-01-04 | Payer: Self-pay | Admitting: Obstetrics and Gynecology

## 2010-01-09 ENCOUNTER — Encounter: Admission: RE | Admit: 2010-01-09 | Discharge: 2010-01-09 | Payer: Self-pay | Admitting: Obstetrics and Gynecology

## 2010-01-11 ENCOUNTER — Encounter: Admission: RE | Admit: 2010-01-11 | Discharge: 2010-01-11 | Payer: Self-pay | Admitting: General Surgery

## 2010-01-11 IMAGING — CT CT ABD-PELV W/ CM
2 of 4 series · 10 of 36 positions shown, 17 images · IV contrast (READICAT/WATER & [ID] OMNI 300)
Comparison: None

CLINICAL DATA: Right lower quadrant pain

CT ABDOMEN AND PELVIS WITH CONTRAST
TECHNIQUE: Multidetector CT imaging of the abdomen and pelvis was
performed following the standard protocol during bolus
administration of intravenous contrast.
Contrast: 100 ml of omni 300

[Series 3: routine abdomen · axial · 0.70mm/px · z∈[-342,+3]mm · 9 of 85 slices shown, 15 images]
[im 9/85  soft-tissue]
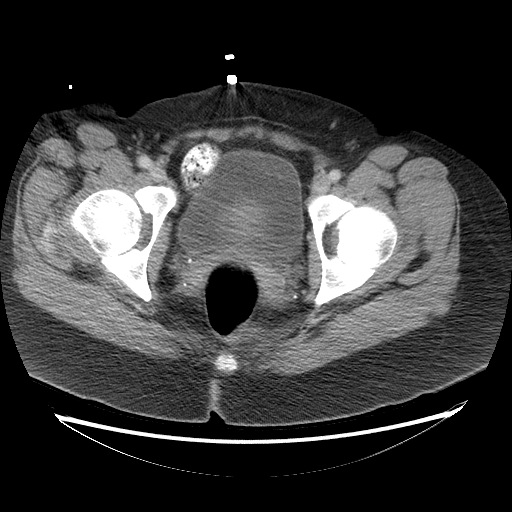
[im 9/85  bone]
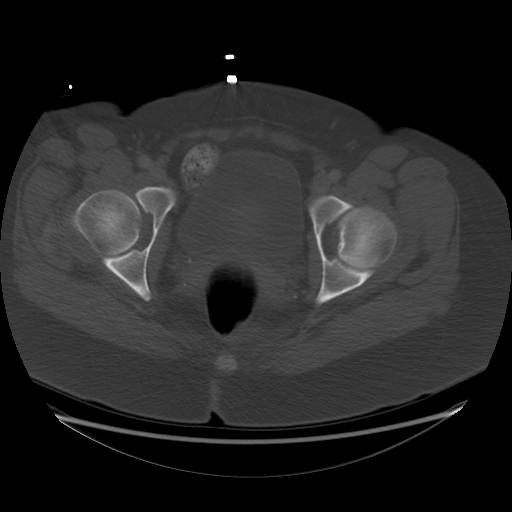
[im 17/85  soft-tissue]
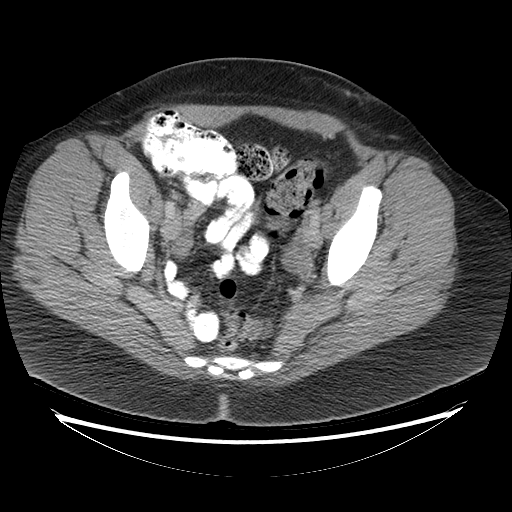
[im 26/85  soft-tissue]
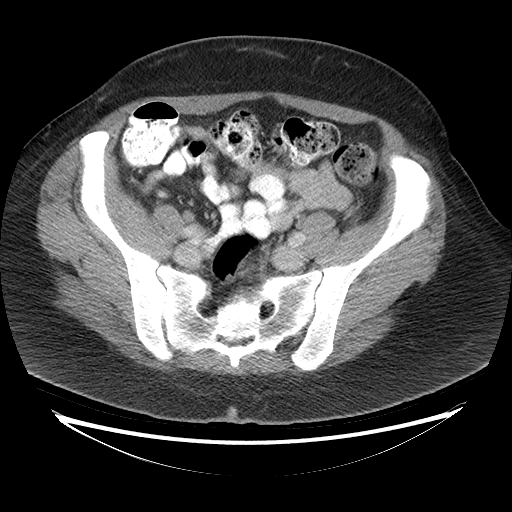
[im 34/85  soft-tissue]
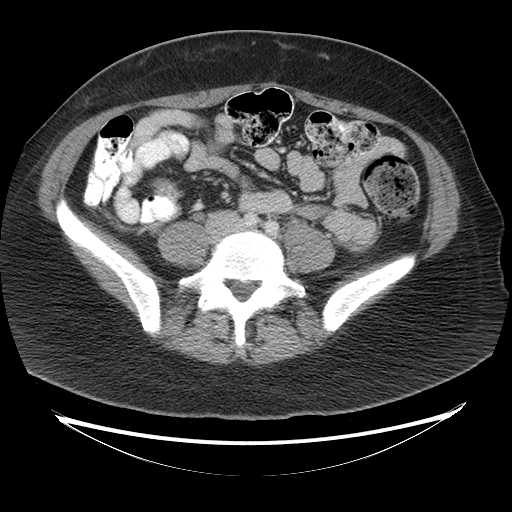
[im 43/85  soft-tissue]
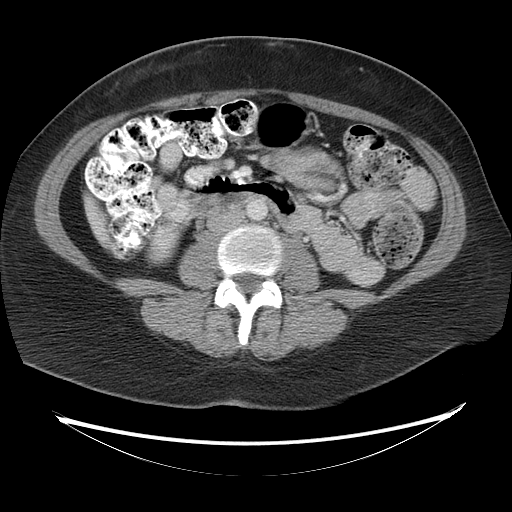
[im 51/85  soft-tissue]
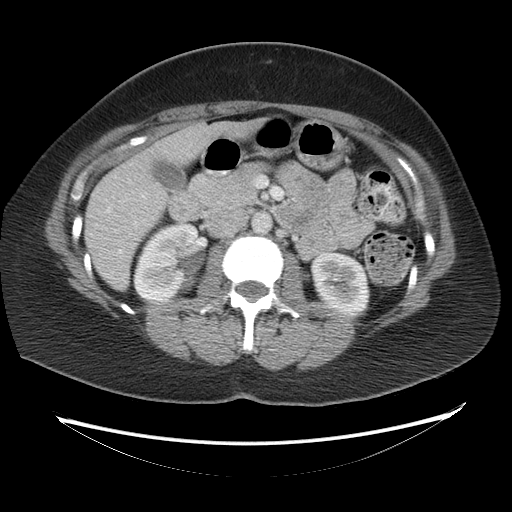
[im 51/85  lung]
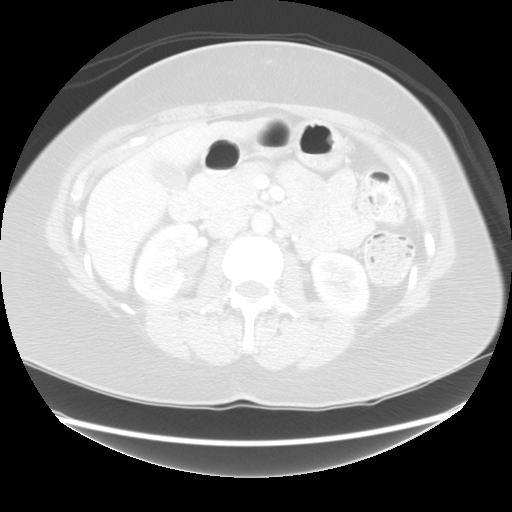
[im 59/85  soft-tissue]
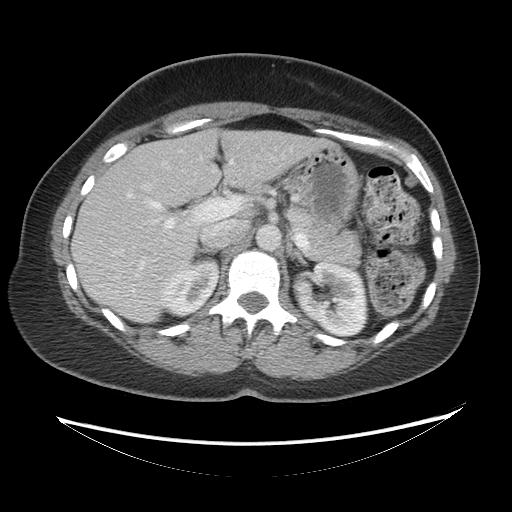
[im 59/85  lung]
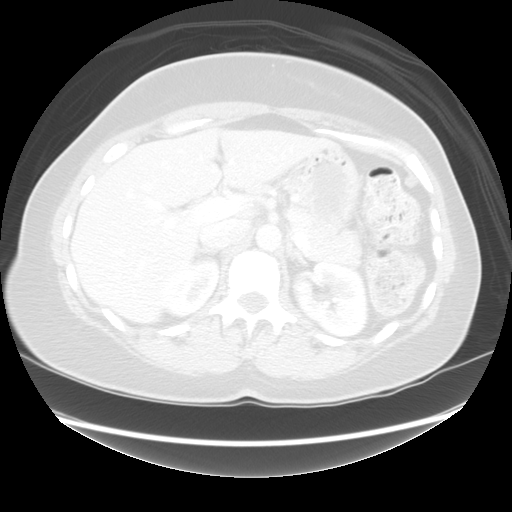
[im 68/85  soft-tissue]
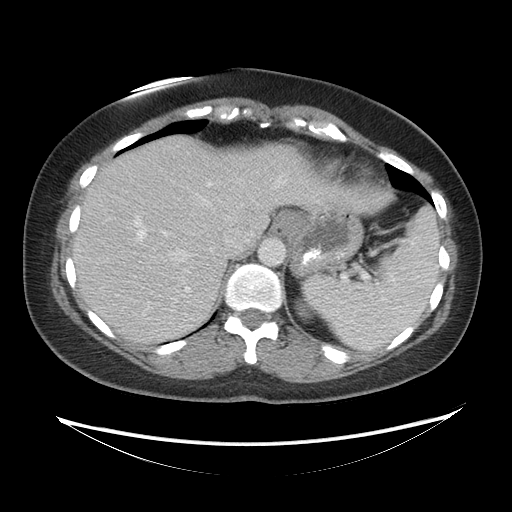
[im 68/85  lung]
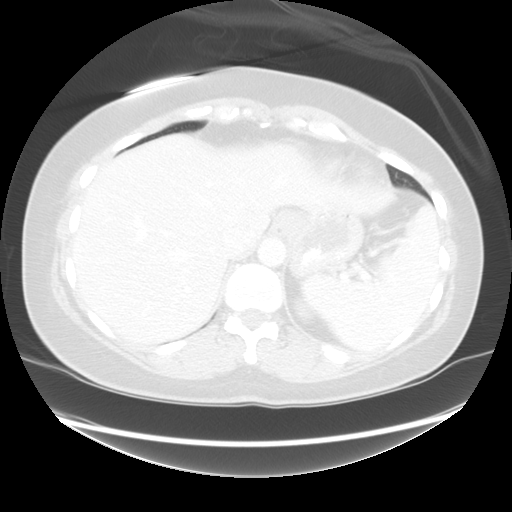
[im 76/85  soft-tissue]
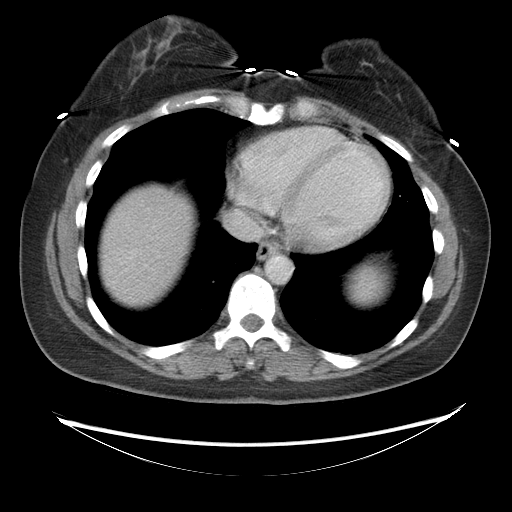
[im 76/85  lung]
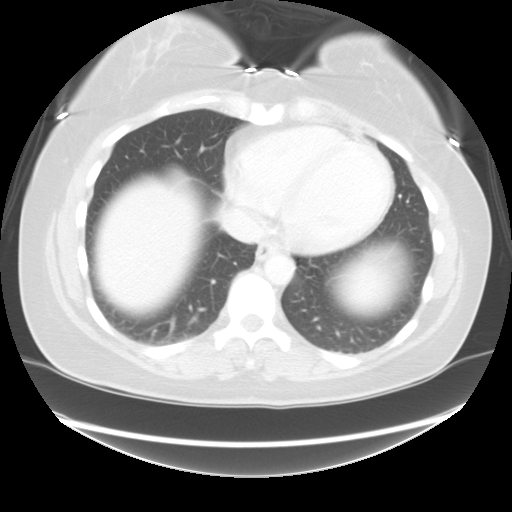
[im 76/85  bone]
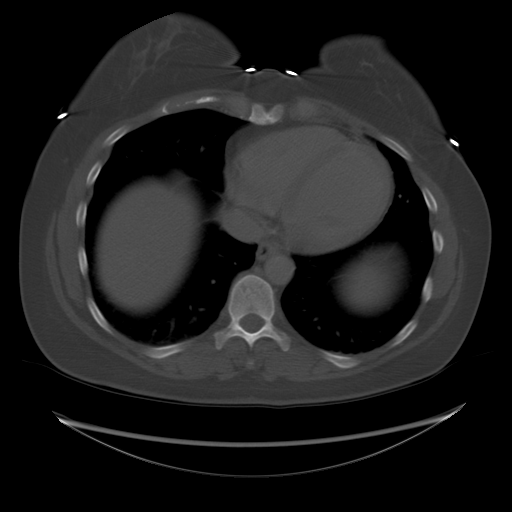

[Series 601: coronal body · coronal · 0.94mm/px · 1 of 135 slices shown, 2 images]
[im 45/135  soft-tissue]
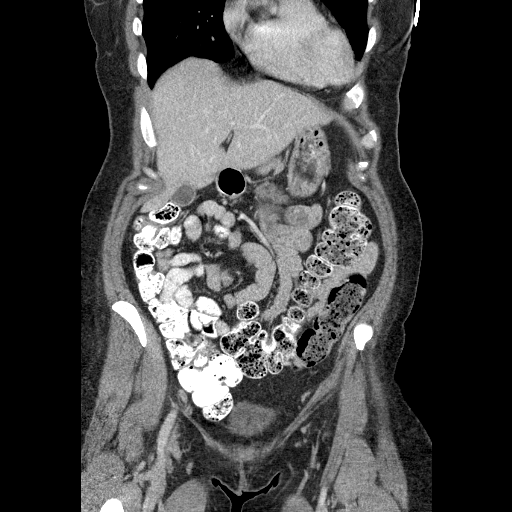
[im 45/135  bone]
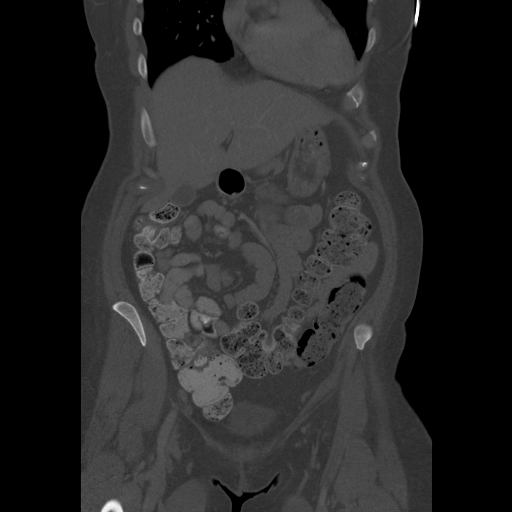

[10 of 36 positions shown; findings below may reference images not displayed]

FINDINGS: Lung bases appear clear.

No pericardial or pleural effusions identified.

The spleen appears normal.

The adrenal gland is normal.

No focal liver abnormalities identified.

The gallbladder appears normal.  There is no biliary ductal
dilatation.

The pancreas appears normal.

Both kidneys are normal.  There is no free fluid or fluid
collections within the upper abdomen.  Bowel loops have a normal
course and caliber.

The appendix is normal.

No enlarged upper abdominal lymph nodes.

No free fluid or fluid collections identified.

The ventral abdominal wall is intact.  Negative for hernia.

There is no free fluid or fluid collections in the pelvis.

The urinary bladder appears normal.  Uterus and the adnexal
structures have a normal appearance

There are no enlarged pelvic or inguinal lymph nodes.

The pelvic bowel loops have a normal appearance

Review of the visualized osseous structures is negative.
IMPRESSION: 1.  No mass or adenopathy within the abdomen or pelvis.
2.  No acute findings.
3.  There are no specific features to suggest hernia.

## 2010-04-29 HISTORY — PX: OTHER SURGICAL HISTORY: SHX169

## 2010-08-03 LAB — CBC
HCT: 34.1 % — ABNORMAL LOW (ref 36.0–46.0)
HCT: 38.8 % (ref 36.0–46.0)
Hemoglobin: 11.3 g/dL — ABNORMAL LOW (ref 12.0–15.0)
Hemoglobin: 11.8 g/dL — ABNORMAL LOW (ref 12.0–15.0)
Hemoglobin: 12.6 g/dL (ref 12.0–15.0)
MCHC: 33.3 g/dL (ref 30.0–36.0)
MCHC: 33.5 g/dL (ref 30.0–36.0)
MCHC: 33.6 g/dL (ref 30.0–36.0)
MCV: 84.3 fL (ref 78.0–100.0)
Platelets: 240 10*3/uL (ref 150–400)
RBC: 4.13 MIL/uL (ref 3.87–5.11)
RBC: 4.6 MIL/uL (ref 3.87–5.11)
RDW: 16 % — ABNORMAL HIGH (ref 11.5–15.5)
RDW: 16.3 % — ABNORMAL HIGH (ref 11.5–15.5)
WBC: 8.4 10*3/uL (ref 4.0–10.5)
WBC: 9.3 10*3/uL (ref 4.0–10.5)

## 2010-08-03 LAB — COMPREHENSIVE METABOLIC PANEL
ALT: 14 U/L (ref 0–35)
ALT: 15 U/L (ref 0–35)
ALT: 30 U/L (ref 0–35)
AST: 24 U/L (ref 0–37)
AST: 25 U/L (ref 0–37)
AST: 41 U/L — ABNORMAL HIGH (ref 0–37)
Albumin: 2.5 g/dL — ABNORMAL LOW (ref 3.5–5.2)
Albumin: 2.6 g/dL — ABNORMAL LOW (ref 3.5–5.2)
Albumin: 2.8 g/dL — ABNORMAL LOW (ref 3.5–5.2)
Alkaline Phosphatase: 63 U/L (ref 39–117)
Alkaline Phosphatase: 64 U/L (ref 39–117)
Alkaline Phosphatase: 93 U/L (ref 39–117)
BUN: 7 mg/dL (ref 6–23)
BUN: 9 mg/dL (ref 6–23)
BUN: 9 mg/dL (ref 6–23)
CO2: 24 mEq/L (ref 19–32)
CO2: 26 mEq/L (ref 19–32)
CO2: 27 mEq/L (ref 19–32)
Calcium: 8.6 mg/dL (ref 8.4–10.5)
Calcium: 8.8 mg/dL (ref 8.4–10.5)
Chloride: 100 mEq/L (ref 96–112)
Chloride: 101 mEq/L (ref 96–112)
Chloride: 104 mEq/L (ref 96–112)
Creatinine, Ser: 0.77 mg/dL (ref 0.4–1.2)
Creatinine, Ser: 0.88 mg/dL (ref 0.4–1.2)
GFR calc Af Amer: >60 mL/min (ref >60)
GFR calc non Af Amer: >60 mL/min (ref >60)
GFR calc non Af Amer: >60 mL/min (ref >60)
GFR calc non Af Amer: >60 mL/min (ref >60)
GFR calc non Af Amer: >60 mL/min (ref >60)
Glucose, Bld: 82 mg/dL (ref 70–99)
Glucose, Bld: 87 mg/dL (ref 70–99)
Glucose, Bld: 92 mg/dL (ref 70–99)
Glucose, Bld: 99 mg/dL (ref 70–99)
Potassium: 3.9 mEq/L (ref 3.5–5.1)
Potassium: 4.1 mEq/L (ref 3.5–5.1)
Potassium: 4.5 mEq/L (ref 3.5–5.1)
Potassium: 4.5 mEq/L (ref 3.5–5.1)
Potassium: 5.1 mEq/L (ref 3.5–5.1)
Sodium: 135 mEq/L (ref 135–145)
Sodium: 138 mEq/L (ref 135–145)
Sodium: 138 mEq/L (ref 135–145)
Total Bilirubin: 0.5 mg/dL (ref 0.3–1.2)
Total Bilirubin: 0.5 mg/dL (ref 0.3–1.2)
Total Bilirubin: 0.5 mg/dL (ref 0.3–1.2)
Total Bilirubin: 0.6 mg/dL (ref 0.3–1.2)
Total Protein: 5.4 g/dL — ABNORMAL LOW (ref 6.0–8.3)
Total Protein: 5.4 g/dL — ABNORMAL LOW (ref 6.0–8.3)
Total Protein: 5.5 g/dL — ABNORMAL LOW (ref 6.0–8.3)
Total Protein: 5.7 g/dL — ABNORMAL LOW (ref 6.0–8.3)

## 2010-08-03 LAB — MAGNESIUM: Magnesium: 4.6 mg/dL — ABNORMAL HIGH (ref 1.5–2.5)

## 2010-08-03 LAB — LACTATE DEHYDROGENASE: LDH: 195 U/L (ref 94–250)

## 2010-08-03 LAB — URIC ACID
Uric Acid, Serum: 6 mg/dL (ref 2.4–7.0)
Uric Acid, Serum: 6.2 mg/dL (ref 2.4–7.0)

## 2010-08-03 LAB — RPR: RPR Ser Ql: NONREACTIVE

## 2010-08-08 LAB — COMPREHENSIVE METABOLIC PANEL
ALT: 4 U/L (ref 0–35)
Albumin: 3.6 g/dL (ref 3.5–5.2)
Alkaline Phosphatase: 43 U/L (ref 39–117)
Calcium: 9.1 mg/dL (ref 8.4–10.5)
GFR calc Af Amer: >60 mL/min (ref >60)
Potassium: 3.9 mEq/L (ref 3.5–5.1)
Sodium: 137 mEq/L (ref 135–145)
Total Protein: 6.7 g/dL (ref 6.0–8.3)

## 2010-08-08 LAB — CBC
Platelets: 239 10*3/uL (ref 150–400)
RDW: 14.1 % (ref 11.5–15.5)

## 2010-08-08 LAB — DIFFERENTIAL
Basophils Relative: 1 % (ref 0–1)
Eosinophils Absolute: 0.1 10*3/uL (ref 0.0–0.7)
Lymphs Abs: 1.5 10*3/uL (ref 0.7–4.0)
Monocytes Absolute: 0.5 10*3/uL (ref 0.1–1.0)
Monocytes Relative: 5 % (ref 3–12)

## 2010-08-08 LAB — POCT CARDIAC MARKERS
CKMB, poc: <1 ng/mL — ABNORMAL LOW (ref 1.0–8.0)
Myoglobin, poc: 41.3 ng/mL (ref 12–200)
Myoglobin, poc: 51.5 ng/mL (ref 12–200)
Troponin i, poc: <0.05 ng/mL (ref 0.00–0.09)
Troponin i, poc: <0.05 ng/mL (ref 0.00–0.09)

## 2010-08-08 LAB — D-DIMER, QUANTITATIVE: D-Dimer, Quant: 0.58 ug/mL-FEU — ABNORMAL HIGH (ref 0.00–0.48)

## 2010-08-14 LAB — URINALYSIS, ROUTINE W REFLEX MICROSCOPIC
Bilirubin Urine: NEGATIVE
Glucose, UA: 100 mg/dL — AB
Hgb urine dipstick: NEGATIVE
Ketones, ur: 15 mg/dL — AB
Protein, ur: NEGATIVE mg/dL
pH: 5.5 (ref 5.0–8.0)

## 2010-08-14 LAB — CBC
HCT: 40.3 % (ref 36.0–46.0)
Hemoglobin: 13.4 g/dL (ref 12.0–15.0)
MCV: 83.4 fL (ref 78.0–100.0)
Platelets: 237 10*3/uL (ref 150–400)
RBC: 4.83 MIL/uL (ref 3.87–5.11)
WBC: 12.2 10*3/uL — ABNORMAL HIGH (ref 4.0–10.5)

## 2010-08-14 LAB — BASIC METABOLIC PANEL
BUN: 15 mg/dL (ref 6–23)
Chloride: 103 mEq/L (ref 96–112)
GFR calc non Af Amer: >60 mL/min (ref >60)
Potassium: 4.2 mEq/L (ref 3.5–5.1)
Sodium: 135 mEq/L (ref 135–145)

## 2010-08-14 LAB — DIFFERENTIAL
Eosinophils Absolute: 0 10*3/uL (ref 0.0–0.7)
Eosinophils Relative: 0 % (ref 0–5)
Lymphocytes Relative: 1 % — ABNORMAL LOW (ref 12–46)
Lymphs Abs: 0.1 10*3/uL — ABNORMAL LOW (ref 0.7–4.0)
Monocytes Absolute: 0.1 10*3/uL (ref 0.1–1.0)
Monocytes Relative: 1 % — ABNORMAL LOW (ref 3–12)

## 2010-09-11 NOTE — Op Note (Signed)
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   PROCEDURES:  1. Wide local excision of 1.6-cm right postauricular dysplastic nevus      with marked high-grade atypia.  2. Complex closure, 3.5-cm right postauricular incision.   ATTENDING SURGEON:  Brantley Persons, MD   ANESTHESIA:  IV sedation with monitoring.   ANESTHESIOLOGIST:  Bedelia Person, MD   FLUID REPLACEMENT:  400 mL of crystalloid.   COMPLICATIONS:  None.   INDICATIONS FOR PROCEDURE:  The patient is a 40 year old Caucasian  female who had a right postauricular nevus biopsy by Dr. Elmon [name].  The pathology report indicates that it is a dysplastic nevus with a high-  grade atypia.  She therefore presents to undergo a wide local excision  and we will proceed with 5 mm circumferential margins around the healing  biopsy site and any residual nevus.   PROCEDURE:  The patient was marked in the preop holding area for the  location of the dysplastic nevus and then she was taken back into the  OR.  She was placed on the table in the lateral decubitus position with  the left side down.  After adequate IV sedation was obtained, the skin  and subcutaneous tissues in the area of the dysplastic nevus and healing  biopsy site were infiltrated with 1% lidocaine with epinephrine.  At  this point, the right ear, face, and postauricular scalp were prepped  with Techni-Care and draped in sterile fashion.  After adequate  hemostasis and  anesthesia taken, effective procedure was begun.   Using loupe magnification, 5-mm circumferential margins were marked  around the healing biopsy site and residual dysplastic nevus.  The  dysplastic nevus was thus excised full thickness through the skin into  the subcutaneous tissues with these margins, marked at the 12 o'clock  position with a silk suture and passed off the table to undergo  permanent pathologic section evaluation.  Total length of specimen  excision was 1.6 cm.  At this point, the skin and soft tissues had to be  widely undermined to undergo closure of the wound.  This was done with  both sharp dissection as well as the Bovie electrocautery.  Meticulous  hemostasis was obtained with the Bovie electrocautery.  A complex  closure of the incision was then proceeded.  The scalpel fascia and deep  subcutaneous tissues were closed with 3-0 Monocryl suture.  The  superficial scalp layer was then also closed with 3-0 Monocryl suture as  well.  The skin was then closed with a 4-0 Monocryl in a running  intracuticular stitch.  Dermabond was then used for a topical dressing.  Total length of incision closure was 3.5 cm.  There were no  complications.  The patient tolerated the procedure well.  She was then  awakened from the IV sedation and taken to recovery room in stable  condition.  The final needle and sponge counts were reported to be  correct at the end of the case.   The patient was then recovered without complications.  Both the patient  and her husband were given proper postoperative wound care instructions.  The patient was then discharged home in the care of her husband in  stable condition.  Followup appointment will be tomorrow in the office.           ______________________________  Brantley Persons, M.D.     MC/MEDQ  D:  04/26/2008  T:  04/26/2008  Job:  [id number]

## 2010-09-14 NOTE — Discharge Summary (Signed)
[name], [name]            ACCOUNT NO.:  [id number]   MEDICAL RECORD NO.:  [id number]          PATIENT TYPE:  INP   LOCATION:  9126                          FACILITY:  [facility]   PHYSICIAN:  Miguel Aschoff, M.D.       DATE OF BIRTH:  [birth year]   DATE OF ADMISSION:  12/12/2005  DATE OF DISCHARGE:  12/15/2005                                 DISCHARGE SUMMARY   FINAL DIAGNOSIS:  Intrauterine pregnancy at 37 weeks, preeclampsia, frank  breech presentation.   PROCEDURE:  Primary low transverse cesarean section.   SURGEON:  Dr. Malva Limes.   COMPLICATIONS:  None.   This 40 year old Gravida 2, para 0-0-1-0 presents at 37 weeks in active  labor. The patient has advanced maternal age.  She had a first trimester  screening that returned normal.  The patient also had a positive group B  strep culture in the office at 35 weeks.  She, looking back at the office  notes, did not have any sign of preeclampsia at that time. The patient had  been recently noted to have a frank breech presentation.  She decided she  wanted a cesarean section at that time. Also in last few days the patient  develop preeclampsia with no evidence of HELLP syndrome.   HOSPITAL COURSE:  At this point the patient was taken to the operating room  on December 12, 2005 by Dr. Malva Limes where a primary low transverse  cesarean section was performed with the delivery of a 6 pound 15 ounce  female infant with Apgars of 9 and 9.  Delivery went without complications.  The patient's postoperative course was uncomplicated.  Blood pressures did  rise and continued to be a little bit elevated after delivery. The patient  was stable and felt ready for discharge on postoperative day #3. She was  sent home on a regular diet, told to decrease activities, was given Tylox  one every 3-4 hours as needed for pain, was to monitor her blood pressures  and call if diastolic was greater than 100.  She was to return our office in  4-5  weeks unless she did have increased blood pressures.  She was given  precautions.   LABS ON DISCHARGE:  The patient had a hemoglobin of 11.1, white blood cell  count of 10.5, platelets of 161,000, and I do not see and PIH panel.      Leilani Able, P.A.-C.      Miguel Aschoff, M.D.  Electronically Signed    MB/MEDQ  D:  01/14/2006  T:  01/15/2006  Job:  [id number]

## 2010-09-14 NOTE — Op Note (Signed)
[name], Rebecca [name]            ACCOUNT NO.:  [id number]   MEDICAL RECORD NO.:  [id number]          PATIENT TYPE:  INP   LOCATION:  9199                          FACILITY:  [facility]   PHYSICIAN:  Malva Limes, M.D.    DATE OF BIRTH:  1971/02/12   DATE OF PROCEDURE:  12/12/2005  DATE OF DISCHARGE:                                 OPERATIVE REPORT   PREOPERATIVE DIAGNOSES:  1. Intrauterine pregnancy at 37 weeks.  2. Preeclampsia.  3. Homero Fellers breech presentation.   POSTOPERATIVE DIAGNOSES:  Same.   PROCEDURE:  Primary low transverse cesarean section.   SURGEON:  Malva Limes, M.D.   ASSISTANT:  Deniece Portela.   MEDICATIONS:  Ancef 1 gram.   DRAINS:  Foley to bedside drainage.   SPECIMENS:  Placenta sent to pathology.   ESTIMATED BLOOD LOSS:  900 mL.   COMPLICATIONS:  None.   INDICATIONS FOR PROCEDURE:  [name] is a 40 year old white female at 58  weeks estimated gestational age who recently was noted to have frank breech  presentation.  Also of note, in the last three days the patient developed  preeclampsia.  She has no evidence of HELLP syndrome.  The patient was  offered an attempt at external version and declined.   DESCRIPTION OF PROCEDURE:  The patient was taken to the operating room where  spinal anesthetic was administered without complications.  She was then  placed on dorsal supine position with a left lateral tilt.  She was prepped  with Betadine and a Foley catheter placed.  She was then draped in the usual  fashion for this procedure.  A Pfannenstiel skin incision was made and  carried down to the fascia.  The fascia was entered in the  midline and  extended laterally with the Mayo scissors.  The rectus muscles were  dissected from the fascia without difficulty.  Rectus muscle were divided in  the midline.   Parietal peritoneum was opened and taken superior and inferiorly.  The  bladder flap was taken down sharply.  A low transverse uterine incision was  made in  the midline and extended laterally with blunt dissection.  Amniotic  fluid was noted to be clear.  The infant was delivered in the breech  presentation.  The patient delivered one live viable white female infant  with a nuchal cord times one.  The placenta was then manually delivered and  handed to the waiting cord blood banking team.  The uterus was exteriorized  and examined.  There with no evidence of any kind of septum or abnormal  shaped uterus. The uterine cavity was wiped with a wet lap and inspected.  The uterine incision was closed in a single layer of 0 Monocryl in a running  locking fashion.   The bladder flap was closed using 2-0 Monocryl in a running fashion.  The  uterus was placed back in the abdominal cavity and found to be hemostatic.  The parietal peritoneum and rectus muscles were approximated in the midline  using 2-0 Monocryl in a running fashion.  The fascia was closed using 0  Monocryl suture in a  running fashion.  The subcuticular tissue was made  hemostatic with the Bovie.  Stainless steel clips were used to close the  skin.  The patient tolerated the procedure well.  She was taken to the  recovery room in stable condition.  Instrument and lap counts were correct  times two.           ______________________________  Malva Limes, M.D.     MA/MEDQ  D:  12/12/2005  T:  12/12/2005  Job:  [id number]

## 2010-12-10 ENCOUNTER — Encounter (INDEPENDENT_AMBULATORY_CARE_PROVIDER_SITE_OTHER): Payer: Self-pay | Admitting: General Surgery

## 2010-12-10 ENCOUNTER — Ambulatory Visit (INDEPENDENT_AMBULATORY_CARE_PROVIDER_SITE_OTHER): Payer: BC Managed Care – PPO | Admitting: General Surgery

## 2010-12-10 VITALS — BP 122/84 | HR 64 | Temp 98.3°F | Ht 70.0 in | Wt 167.2 lb

## 2010-12-10 DIAGNOSIS — K432 Incisional hernia without obstruction or gangrene: Secondary | ICD-10-CM

## 2010-12-10 DIAGNOSIS — R1031 Right lower quadrant pain: Secondary | ICD-10-CM

## 2010-12-10 NOTE — Progress Notes (Signed)
Subjective:     Patient ID: Rebecca [name], female   DOB: 1971-04-16, 40 y.o.   MRN: [id number]  HPI This is a pleasant 40 year old Caucasian female, referred to me by Aquilla Hacker for evaluation of right lower quadrant abdominal wall mass.  The patient states that she has had 2 C-sections in the past. The last was in 2007. She's noticed a bulge in her right lower quadrant since that time.  Evaluated by Dr. Carolynne Edouard in September 2011 and had a physical exam twice and had a CT scan and no mass and no hernia was identified either clinically or radiographically.  She had abdominoplasty by Dr. Ellery Plunk in April 2012. She says he did not find anything at that time. She continues to have a painless lump but is concerned about the visual appearance and she is convinced that she has a hernia. No change in her bowel habits. Otherwise healthy.  Her primary care physician is Dr. Merri Brunette. Her gynecologist is Dr. Carrington Clamp.  Past Medical History  Diagnosis Date  . Migraines    Current Outpatient Prescriptions  Medication Sig Dispense Refill  . Norethin-Eth Estrad-Fe Biphas (LO LOESTRIN FE PO) Take by mouth daily. Patient unsure of dosage.        . Topiramate (TOPAMAX PO) Take 100 mg by mouth daily.         No Known Allergies  Family History  Problem Relation Age of Onset  . Lymphoma Father   . Diabetes Brother     History  Substance Use Topics  . Smoking status: Former Games developer  . Smokeless tobacco: Not on file  . Alcohol Use: Yes     once or twice per month - occasional      Review of Systems  Constitutional: Negative.   Eyes: Negative.   Respiratory: Negative.   Cardiovascular: Negative.   Gastrointestinal: Positive for abdominal distention. Negative for nausea, vomiting, abdominal pain, diarrhea, constipation, blood in stool, anal bleeding and rectal pain.  Genitourinary: Negative.   Musculoskeletal: Positive for myalgias.  Skin: Negative.   Neurological: Positive for  headaches. Negative for dizziness, tremors, seizures, syncope, facial asymmetry, speech difficulty, weakness, light-headedness and numbness.  Hematological: Negative.   Psychiatric/Behavioral: Negative.        Objective:   Physical Exam  Constitutional: She is oriented to person, place, and time. She appears well-developed and well-nourished. No distress.  HENT:  Head: Normocephalic and atraumatic.  Mouth/Throat: No oropharyngeal exudate.  Eyes: Conjunctivae and EOM are normal. Pupils are equal, round, and reactive to light. Left eye exhibits no discharge. No scleral icterus.  Neck: Normal range of motion. Neck supple. No JVD present. No tracheal deviation present. No thyromegaly present.  Cardiovascular: Normal rate, regular rhythm and normal heart sounds.   No murmur heard. Pulmonary/Chest: Effort normal and breath sounds normal. No respiratory distress. She has no wheezes. She has no rales. She exhibits no tenderness.  Abdominal: Soft. Bowel sounds are normal. She exhibits mass. She exhibits no distension. There is no tenderness. There is no rebound and no guarding.    Musculoskeletal: Normal range of motion. She exhibits no edema and no tenderness.  Lymphadenopathy:    She has no cervical adenopathy.  Neurological: She is alert and oriented to person, place, and time.  Skin: Skin is warm and dry. No rash noted. No erythema. No pallor.  Psychiatric: She has a normal mood and affect. Her behavior is normal. Judgment and thought content normal.       Assessment:  Right lower quadrant abdominal mass. Physical findings suggest ventral incisional hernia. This was discussed with the patient.  Status post cesarean section x2.  Status post abdominoplasty.  Chronic headaches.  Possible anal hernia repair as child.    Plan:     Scheduled for CT of the abdomen and pelvis with contrast to confirm the diagnosis and to define the extent of the suspected hernia.  Return to see me  in 2-3 weeks for further discussion and a repeat physical exam.  She may require surgical repair with mesh for this.

## 2010-12-10 NOTE — Patient Instructions (Signed)
Ibelieve that you are developing an incisional hernia in your right lower quadrant. I do not feel any hernia elsewhere in your abdomen. Because of your multiple surgeries and the previous confusion about the diagnosis, I am going to schedule you for another CAT scan of the abdomen and pelvis with contrast. You will return to see me in 2-3 weeks for review of the CT findings and another physical exam and we'll try to make a treatment plan at that time.

## 2010-12-13 ENCOUNTER — Other Ambulatory Visit (INDEPENDENT_AMBULATORY_CARE_PROVIDER_SITE_OTHER): Payer: Self-pay | Admitting: General Surgery

## 2010-12-13 ENCOUNTER — Ambulatory Visit
Admission: RE | Admit: 2010-12-13 | Discharge: 2010-12-13 | Disposition: A | Discharge status: Home / Self Care | Payer: BC Managed Care – PPO | Source: Ambulatory Visit | Attending: General Surgery | Admitting: General Surgery

## 2010-12-13 DIAGNOSIS — R1031 Right lower quadrant pain: Secondary | ICD-10-CM

## 2010-12-13 IMAGING — CT CT ABD-PELV W/ CM
3 of 5 series · 13 of 36 positions shown, 19 images · IV contrast (READICAT/WATER & [ID] OMNI 300)
Comparison: CT scan [DATE].

CLINICAL DATA: Right lower quadrant abdominal pain and bulge.

CT ABDOMEN AND PELVIS WITH CONTRAST
TECHNIQUE: Multidetector CT imaging of the abdomen and pelvis was
performed following the standard protocol during bolus
administration of intravenous contrast.
Contrast: 100 ml [UQ].

[Series 3: routine abdomen · axial · 0.70mm/px · z∈[-368,-53]mm · 7 of 85 slices shown, 12 images]
[im 11/85  soft-tissue]
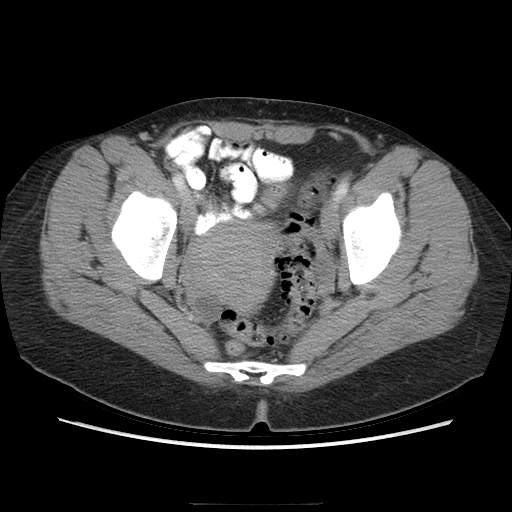
[im 11/85  bone]
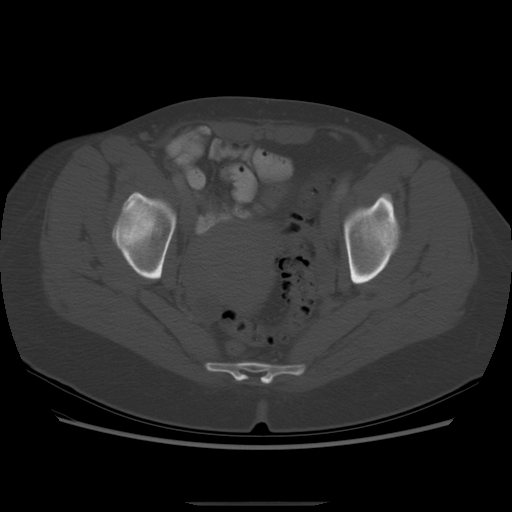
[im 22/85  soft-tissue]
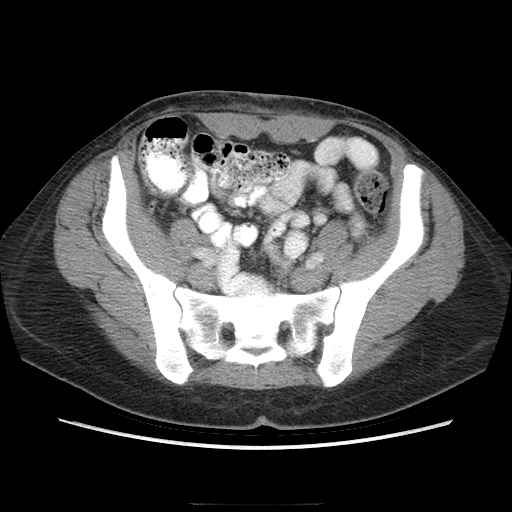
[im 32/85  soft-tissue]
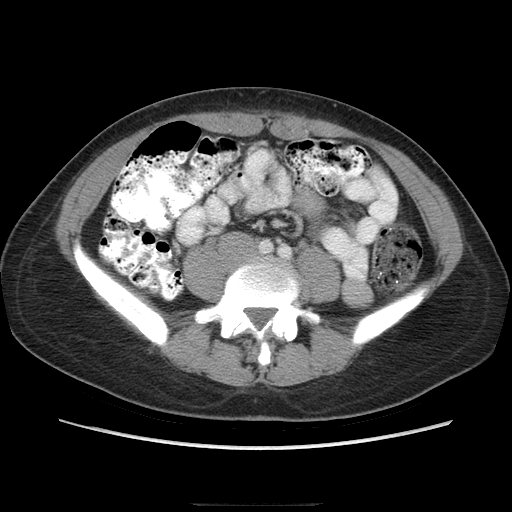
[im 43/85  soft-tissue]
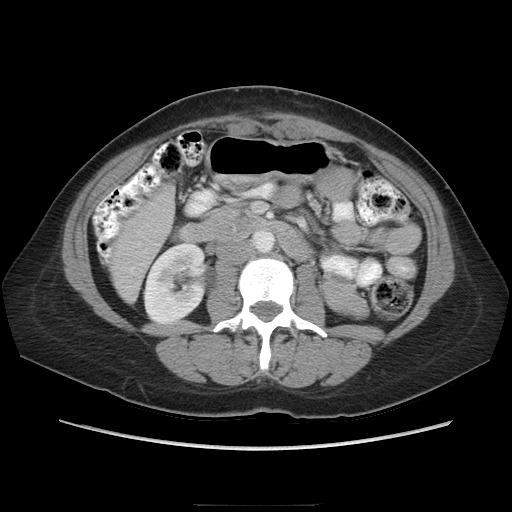
[im 43/85  lung]
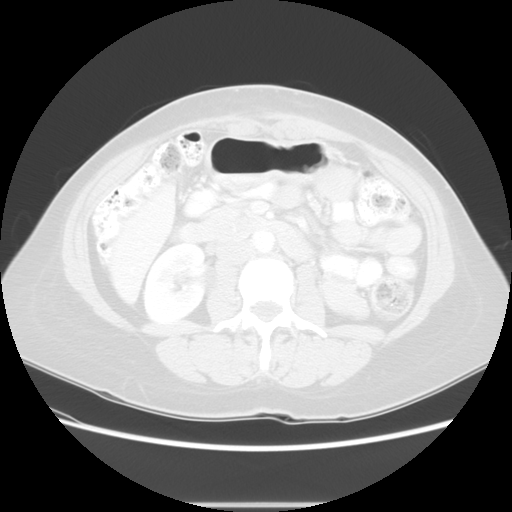
[im 53/85  soft-tissue]
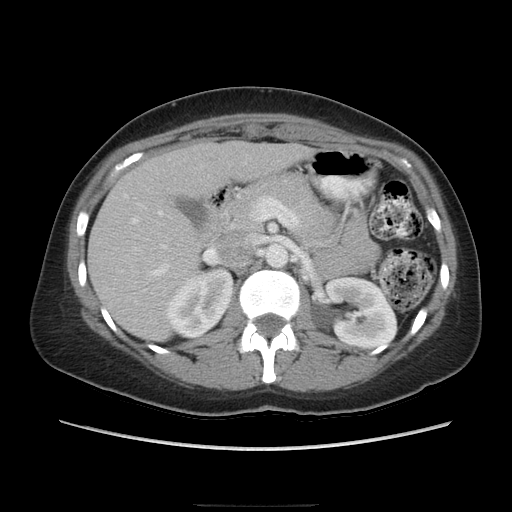
[im 53/85  lung]
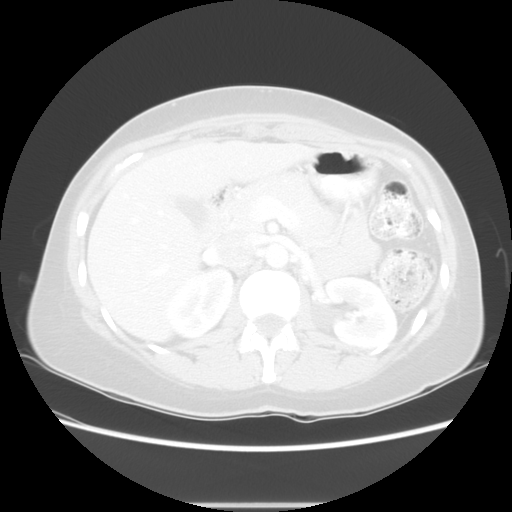
[im 64/85  soft-tissue]
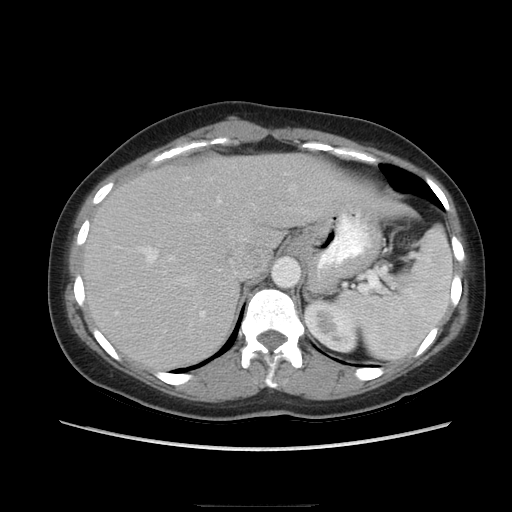
[im 64/85  lung]
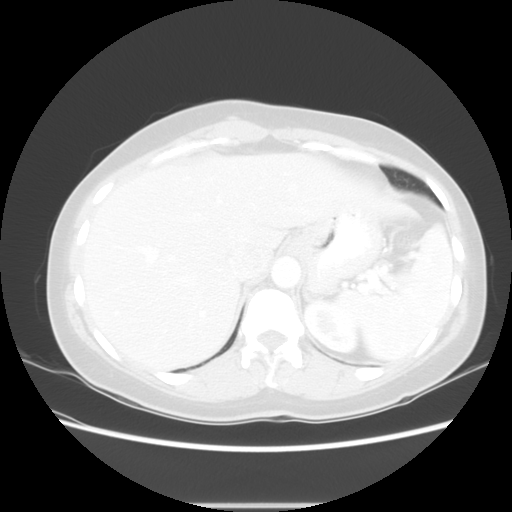
[im 74/85  soft-tissue]
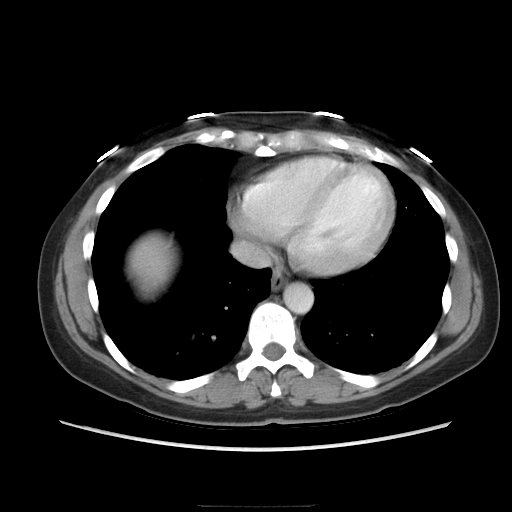
[im 74/85  lung]
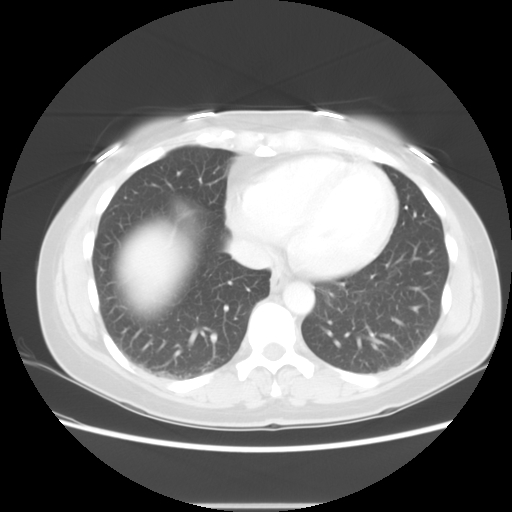

[Series 601: coronal body · coronal · 0.95mm/px · 1 of 110 slices shown, 2 images]
[im 37/110  soft-tissue]
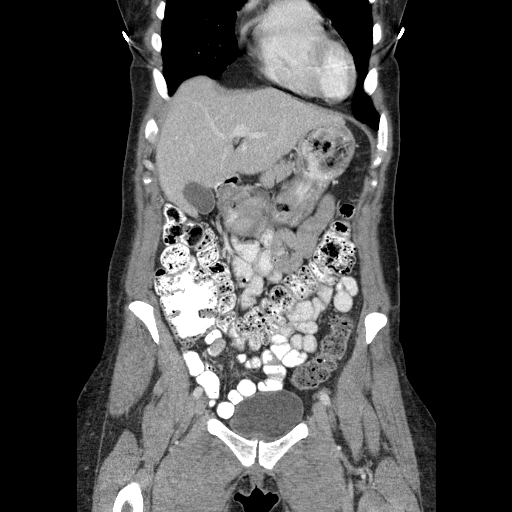
[im 37/110  bone]
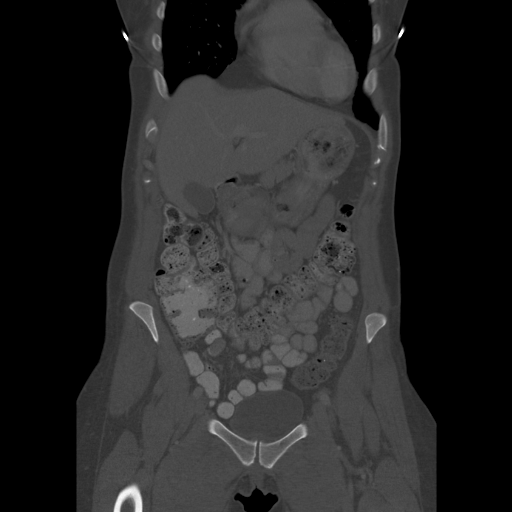

[Series 602: sagittal body · sagittal · 0.95mm/px · 5 of 145 slices shown]
[im 10/145  soft-tissue]
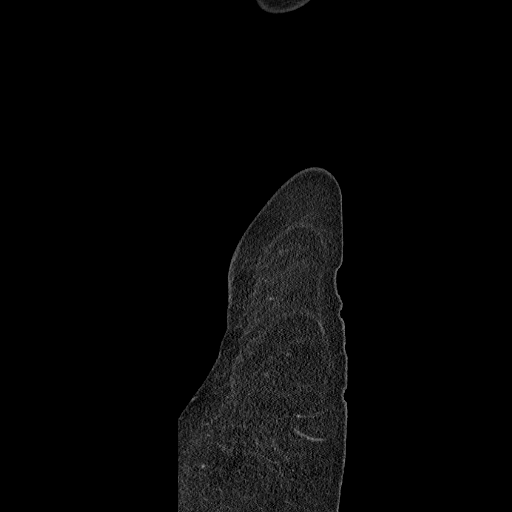
[im 29/145  soft-tissue]
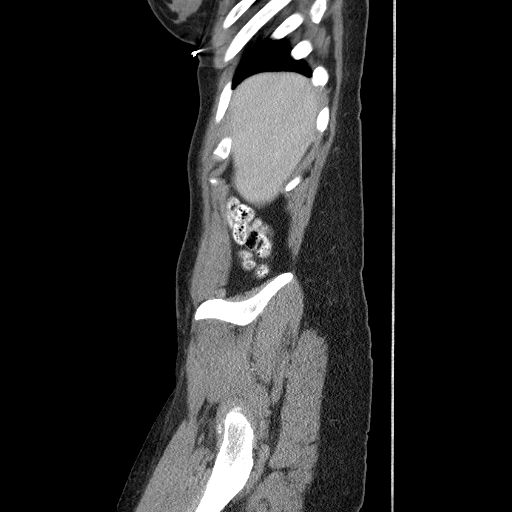
[im 49/145  soft-tissue]
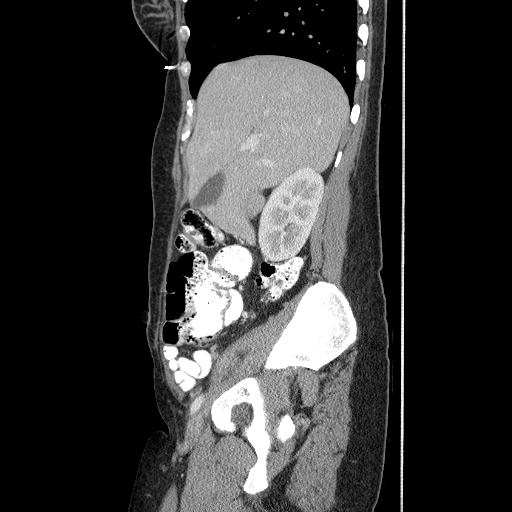
[im 68/145  soft-tissue]
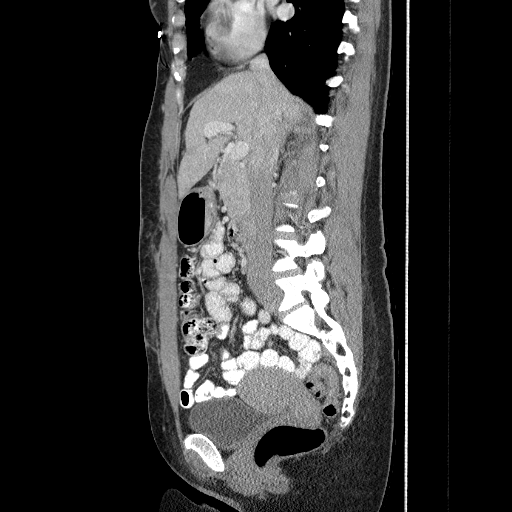
[im 77/145  soft-tissue]
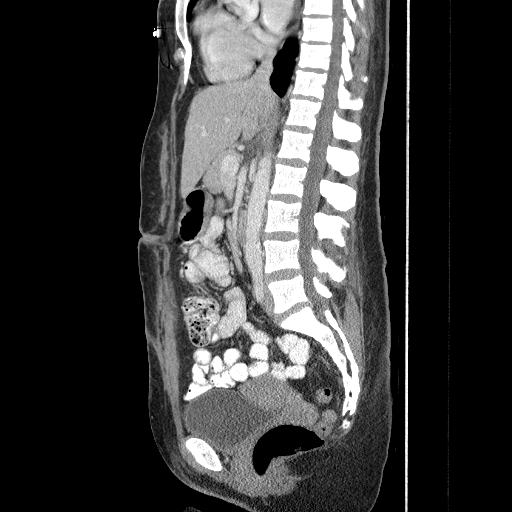

[13 of 36 positions shown; findings below may reference images not displayed]

FINDINGS: The lung bases are clear.

The liver is unremarkable.  No focal lesions or biliary dilatation.
The gallbladder is normal.  No common bile duct dilatation.  The
pancreas is normal.  The spleen is normal in size.  No focal
lesions.  The adrenal glands and kidneys are normal.

The stomach is under distended but no gross abnormalities are seen.
There is a small hiatal hernia noted.  The duodenum, small bowel
and colon are unremarkable.  There is moderate stool throughout the
colon and scattered diverticulosis. The appendix is normal.  No
mesenteric or retroperitoneal masses or adenopathy.  There are
scattered lymph nodes.  The aorta is normal in caliber.  The major
branch vessels are patent.  The portal and splenic veins patent.

The uterus and ovaries are unremarkable except for a simple
appearing ovarian cysts.  No pelvic mass, adenopathy or free pelvic
fluid collections.  No pelvic mass or lymphadenopathy.

No anterior abdominal wall hernia is identified. Remote surgical
changes are noted involving the lower abdominal wall.  No inguinal
hernia.  The bony structures are unremarkable.
IMPRESSION: Unremarkable CT abdomen/pelvis.

## 2010-12-13 MED ORDER — IOHEXOL 300 MG/ML  SOLN: 100.0000 mL | Freq: Once | INTRAMUSCULAR | Status: AC | PRN

## 2010-12-24 ENCOUNTER — Encounter (INDEPENDENT_AMBULATORY_CARE_PROVIDER_SITE_OTHER): Payer: Self-pay | Admitting: General Surgery

## 2010-12-24 ENCOUNTER — Ambulatory Visit (INDEPENDENT_AMBULATORY_CARE_PROVIDER_SITE_OTHER): Payer: BC Managed Care – PPO | Admitting: General Surgery

## 2010-12-24 VITALS — BP 108/78 | HR 60

## 2010-12-24 DIAGNOSIS — K432 Incisional hernia without obstruction or gangrene: Secondary | ICD-10-CM

## 2010-12-24 NOTE — Progress Notes (Signed)
Chief Complaint  Patient presents with  . Other    po- reck mass    HPI Rebecca [name] is a 40 y.o. female.    Patient returns for further evaluation of her right lower quadrant bulge. The CT scan is reviewed and does not show an obvious hernia defect.  The patient continues to complain of a bulge that comes and goes and is a little bit uncomfortable. No other symptoms. She stated that the bulge goes away when she lies down but comes up when she stands or coughs or does any exercise. HPI  Past Medical History  Diagnosis Date  . Migraines     Past Surgical History  Procedure Date  . Mini tuck 2012  . Cesarean section 2007/2010    Family History  Problem Relation Age of Onset  . Lymphoma Father   . Diabetes Brother     Social History History  Substance Use Topics  . Smoking status: Former Games developer  . Smokeless tobacco: Not on file  . Alcohol Use: Yes     once or twice per month - occasional    No Known Allergies  Current Outpatient Prescriptions  Medication Sig Dispense Refill  . LOESTRIN FE 1/20 1-20 MG-MCG tablet       . Norethin-Eth Estrad-Fe Biphas (LO LOESTRIN FE PO) Take by mouth daily. Patient unsure of dosage.        . Topiramate (TOPAMAX PO) Take 100 mg by mouth daily.          Review of Systems ROS 12 system review of systems is performed and is negative except as described above. Blood pressure 108/78, pulse 60, last menstrual period 12/13/2010.  Physical Exam Physical Exam  The patient is in no distress. She is examined supine and standing, with cough, Valsalva and leg lifting.  Abdomen soft and nontender. In the right lower quadrant overlying the abdominoplasty scar there is a bulge that is soft and reducible. I am pretty sure I can feel a defect in the fascia that is 4-5 cm in size. I do not feel any obvious intestinal content. Physical exam is typical for a reducible hernia. Data Reviewed I have reviewed the CT scan and prior office  notes.  Assessment    Right lower quadrant discomfort and reducible bulge. The physical findings are almost classic for a ventral hernia. This may be an incisional hernia from her multiple prior operations or a spigelian hernia.  It is difficult for me to explain the normal CT scan, although it is possible she has a hernia and the CT scan cannot detect it with her supine. It may simply be an eventration or weakening of the muscles.    Plan    She was given several options.  She was given the option of proceeding with wound exploration and repair of a ventral hernia with mesh electively. She was also given the option of a second opinion with one of the [facility]  hernia specialists, or with another surgeon in town. She was also given the option of doing nothing and waiting to see whether her symptoms progress.  At this point in time she is going to go home and think about this. She states that she will call me back and possibly schedule surgery in the fall.  I have discussed indications and details of surgery with her. Risks and complications have been outlined, including but limited to bleeding, infection, recurrence of the hernia, findings of no true hernia, cardiac pulmonary and thromboembolic  problems. She understands all these issues well. His bowel for questions answered. She is in full agreement with this plan.       Jarmel Linhardt M 12/24/2010, 9:29 AM

## 2010-12-24 NOTE — Patient Instructions (Signed)
Your CT scan does not show an obvious hernia. Your physical exam, however, does show a reducible bulge in your right lower quadrant, and I am pretty sure that I can feel a defect in the muscle it. Most likely you are developing a ventral hernia. From a medical standpoint, it is appropriate to proceed with exploration of the right lower quadrant scar and repair the hernia with mesh. It is also appropriate to do nothing at this time and wait and see what happens. It is also appropriate to refer you to another surgeon or hernia specialist if you desire. At this time our plan is for you to call back to schedule the surgery when you feel ready.

## 2011-02-01 ENCOUNTER — Other Ambulatory Visit: Payer: Self-pay | Admitting: Obstetrics and Gynecology

## 2011-03-15 ENCOUNTER — Other Ambulatory Visit: Payer: Self-pay | Admitting: Obstetrics and Gynecology

## 2011-05-29 ENCOUNTER — Encounter (HOSPITAL_COMMUNITY): Payer: Self-pay | Admitting: Pharmacist

## 2011-06-04 ENCOUNTER — Encounter (HOSPITAL_COMMUNITY)
Admission: RE | Admit: 2011-06-04 | Discharge: 2011-06-04 | Disposition: A | Discharge status: Home / Self Care | Payer: BC Managed Care – PPO | Source: Ambulatory Visit | Attending: Obstetrics and Gynecology | Admitting: Obstetrics and Gynecology

## 2011-06-04 ENCOUNTER — Encounter (HOSPITAL_COMMUNITY): Payer: Self-pay

## 2011-06-04 HISTORY — DX: Anxiety disorder, unspecified: F41.9

## 2011-06-04 LAB — CBC
Hemoglobin: 12.5 g/dL (ref 12.0–15.0)
MCHC: 31.6 g/dL (ref 30.0–36.0)
Platelets: 265 10*3/uL (ref 150–400)

## 2011-06-04 LAB — SURGICAL PCR SCREEN
MRSA, PCR: NEGATIVE
Staphylococcus aureus: POSITIVE — AB

## 2011-06-04 NOTE — Patient Instructions (Signed)
   Your procedure is scheduled on: Wednesday, Feb 13  Enter through the [employer] [facility] at: 10am Pick up the phone at the desk and dial [phone] and inform us of your arrival.  Please call this number if you have any problems the morning of surgery: [phone]  Remember: Do not eat food after midnight: Tuesday Do not drink clear liquids after:  Tuesday Take these medicines the morning of surgery with a SIP OF WATER: None (meds taken qhs)  Do not wear jewelry, make-up, or FINGER nail polish Do not wear lotions, powders, perfumes or deodorant. Do not shave 48 hours prior to surgery. Do not bring valuables to the hospital.  Patients discharged on the day of surgery will not be allowed to drive home.  Home with Mother Rema Jasmine cell [phone]   Remember to use your hibiclens as instructed.Please shower with 1/2 bottle the evening before your surgery and the other 1/2 bottle the morning of surgery.

## 2011-06-04 NOTE — Pre-Procedure Instructions (Signed)
Ok to see patient DOS. 

## 2011-06-07 ENCOUNTER — Other Ambulatory Visit: Payer: Self-pay | Admitting: Obstetrics and Gynecology

## 2011-06-07 NOTE — H&P (Signed)
41 y.o. yo desires sterilation.  Normal periods; on Loestrin now.  Past Medical History  Diagnosis Date  . Migraines   . Anxiety    Past Surgical History  Procedure Date  . Mini tuck 2012  . Cesarean section 2007/2010  . 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 Wisdom tooth extraction     History   Social History  . Marital Status: Married    Spouse Name: N/A    Number of Children: N/A  . Years of Education: N/A   Occupational History  . Not on file.   Social History Main Topics  . Smoking status: Former Smoker -- 0.2 packs/day for 6 years    Types: Cigarettes    Quit date: 03/29/2005  . Smokeless tobacco: Never Used  . Alcohol Use: Yes     socially  . Drug Use: No  . Sexually Active: Yes    Birth Control/ Protection: None   Other Topics Concern  . Not on file   Social History Narrative  . No narrative on file    No current facility-administered medications on file prior to encounter.   Current Outpatient Prescriptions on File Prior to Encounter  Medication Sig Dispense Refill  . Topiramate (TOPAMAX PO) Take 100 mg by mouth daily.          No Known Allergies  @VITALS2 @  Lungs: clear to ascultation Cor:  RRR Abdomen:  soft, nontender, nondistended.  ? Inguinal or incisional hernia. Ex:  no cords, erythema Pelvic:  NEFG, nml s/s 7 weeks uterus, sl tender cul de sac.  No masses.  A: Desires filschie clip BTL for sterilization.   P:   All risks, benefits and alternatives d/w patient and she desires to proceed.  Will look at anterior abdominal wall internally for hernias.   Marland Kitchen Gwyndolyn Guilford A

## 2011-06-12 ENCOUNTER — Encounter (HOSPITAL_COMMUNITY): Payer: Self-pay | Admitting: Registered Nurse

## 2011-06-12 ENCOUNTER — Ambulatory Visit (HOSPITAL_COMMUNITY)
Admission: RE | Admit: 2011-06-12 | Discharge: 2011-06-12 | Disposition: A | Discharge status: Home / Self Care | Payer: BC Managed Care – PPO | Source: Ambulatory Visit | Attending: Obstetrics and Gynecology | Admitting: Obstetrics and Gynecology

## 2011-06-12 ENCOUNTER — Ambulatory Visit (HOSPITAL_COMMUNITY): Payer: BC Managed Care – PPO | Admitting: Registered Nurse

## 2011-06-12 ENCOUNTER — Encounter (HOSPITAL_COMMUNITY)
Admission: RE | Disposition: A | Discharge status: Home / Self Care | Payer: Self-pay | Source: Ambulatory Visit | Attending: Obstetrics and Gynecology

## 2011-06-12 DIAGNOSIS — Z302 Encounter for sterilization: Secondary | ICD-10-CM | POA: Insufficient documentation

## 2011-06-12 DIAGNOSIS — Z9889 Other specified postprocedural states: Secondary | ICD-10-CM

## 2011-06-12 HISTORY — PX: LAPAROSCOPIC TUBAL LIGATION: SHX1937

## 2011-06-12 LAB — HCG, SERUM, QUALITATIVE: Preg, Serum: NEGATIVE

## 2011-06-12 SURGERY — LIGATION, FALLOPIAN TUBE, LAPAROSCOPIC
Anesthesia: General | Site: Abdomen | Wound class: Clean Contaminated

## 2011-06-12 MED ORDER — OXYCODONE-ACETAMINOPHEN 5-325 MG PO TABS
ORAL_TABLET | ORAL | Status: AC
  Filled 2011-06-12: qty 1

## 2011-06-12 MED ORDER — NEOSTIGMINE METHYLSULFATE 1 MG/ML IJ SOLN
INTRAMUSCULAR | Status: AC
  Filled 2011-06-12: qty 10

## 2011-06-12 MED ORDER — KETOROLAC TROMETHAMINE 30 MG/ML IJ SOLN
INTRAMUSCULAR | Status: DC | PRN
  Administered 2011-06-12: 30 mg via INTRAVENOUS

## 2011-06-12 MED ORDER — ONDANSETRON HCL 4 MG/2ML IJ SOLN
INTRAMUSCULAR | Status: DC | PRN
  Administered 2011-06-12: 4 mg via INTRAVENOUS

## 2011-06-12 MED ORDER — FENTANYL CITRATE 0.05 MG/ML IJ SOLN
INTRAMUSCULAR | Status: AC
  Administered 2011-06-12: 25 ug via INTRAVENOUS
  Filled 2011-06-12: qty 2

## 2011-06-12 MED ORDER — FENTANYL CITRATE 0.05 MG/ML IJ SOLN
INTRAMUSCULAR | Status: DC | PRN
  Administered 2011-06-12: 50 ug via INTRAVENOUS
  Administered 2011-06-12 (×2): 100 ug via INTRAVENOUS

## 2011-06-12 MED ORDER — SUCCINYLCHOLINE CHLORIDE 20 MG/ML IJ SOLN
INTRAMUSCULAR | Status: DC | PRN
  Administered 2011-06-12: 120 mg via INTRAVENOUS

## 2011-06-12 MED ORDER — KETOROLAC TROMETHAMINE 30 MG/ML IJ SOLN
INTRAMUSCULAR | Status: AC
  Filled 2011-06-12: qty 1

## 2011-06-12 MED ORDER — GLYCOPYRROLATE 0.2 MG/ML IJ SOLN
INTRAMUSCULAR | Status: AC
  Filled 2011-06-12: qty 1

## 2011-06-12 MED ORDER — ROCURONIUM BROMIDE 50 MG/5ML IV SOLN
INTRAVENOUS | Status: AC
  Filled 2011-06-12: qty 1

## 2011-06-12 MED ORDER — SUCCINYLCHOLINE CHLORIDE 20 MG/ML IJ SOLN
INTRAMUSCULAR | Status: AC
  Filled 2011-06-12: qty 10

## 2011-06-12 MED ORDER — OXYCODONE-ACETAMINOPHEN 5-325 MG PO TABS
1.0000 | ORAL_TABLET | Freq: Once | ORAL | Status: AC
  Administered 2011-06-12: 1 via ORAL

## 2011-06-12 MED ORDER — MIDAZOLAM HCL 2 MG/2ML IJ SOLN
INTRAMUSCULAR | Status: AC
  Filled 2011-06-12: qty 2

## 2011-06-12 MED ORDER — PROPOFOL 10 MG/ML IV EMUL
INTRAVENOUS | Status: DC | PRN
  Administered 2011-06-12: 200 mg via INTRAVENOUS

## 2011-06-12 MED ORDER — LIDOCAINE HCL (CARDIAC) 20 MG/ML IV SOLN
INTRAVENOUS | Status: AC
  Filled 2011-06-12: qty 5

## 2011-06-12 MED ORDER — FENTANYL CITRATE 0.05 MG/ML IJ SOLN
25.0000 ug | INTRAMUSCULAR | Status: DC | PRN
  Administered 2011-06-12: 25 ug via INTRAVENOUS

## 2011-06-12 MED ORDER — LIDOCAINE HCL (CARDIAC) 20 MG/ML IV SOLN
INTRAVENOUS | Status: DC | PRN
  Administered 2011-06-12: 60 mg via INTRAVENOUS

## 2011-06-12 MED ORDER — OXYCODONE-ACETAMINOPHEN 5-325 MG PO TABS: 1.0000 | ORAL_TABLET | ORAL | Status: AC | PRN

## 2011-06-12 MED ORDER — LACTATED RINGERS IV SOLN
INTRAVENOUS | Status: DC
  Administered 2011-06-12 (×2): via INTRAVENOUS

## 2011-06-12 MED ORDER — DEXAMETHASONE SODIUM PHOSPHATE 10 MG/ML IJ SOLN
INTRAMUSCULAR | Status: DC | PRN
  Administered 2011-06-12: 10 mg via INTRAVENOUS

## 2011-06-12 MED ORDER — FENTANYL CITRATE 0.05 MG/ML IJ SOLN
INTRAMUSCULAR | Status: AC
  Filled 2011-06-12: qty 5

## 2011-06-12 MED ORDER — MIDAZOLAM HCL 5 MG/5ML IJ SOLN
INTRAMUSCULAR | Status: DC | PRN
  Administered 2011-06-12: 2 mg via INTRAVENOUS

## 2011-06-12 MED ORDER — PROPOFOL 10 MG/ML IV EMUL
INTRAVENOUS | Status: AC
  Filled 2011-06-12: qty 20

## 2011-06-12 MED ORDER — ONDANSETRON HCL 4 MG/2ML IJ SOLN
INTRAMUSCULAR | Status: AC
  Filled 2011-06-12: qty 2

## 2011-06-12 MED ORDER — DEXAMETHASONE SODIUM PHOSPHATE 10 MG/ML IJ SOLN
INTRAMUSCULAR | Status: AC
  Filled 2011-06-12: qty 1

## 2011-06-12 SURGICAL SUPPLY — 18 items
ADH SKN CLS APL DERMABOND .7 (GAUZE/BANDAGES/DRESSINGS) ×1
CATH ROBINSON RED A/P 16FR (CATHETERS) ×2 IMPLANT
CHLORAPREP W/TINT 26ML (MISCELLANEOUS) ×2 IMPLANT
CLIP FILSHIE TUBAL LIGA STRL (Clip) ×2 IMPLANT
CLOTH BEACON ORANGE TIMEOUT ST (SAFETY) ×2 IMPLANT
DERMABOND ADVANCED (GAUZE/BANDAGES/DRESSINGS) ×1
DERMABOND ADVANCED .7 DNX12 (GAUZE/BANDAGES/DRESSINGS) ×1 IMPLANT
GLOVE BIO SURGEON STRL SZ7 (GLOVE) ×4 IMPLANT
GOWN PREVENTION PLUS LG XLONG (DISPOSABLE) ×4 IMPLANT
NDL INSUFFLATION 14GA 120MM (NEEDLE) ×1 IMPLANT
NEEDLE INSUFFLATION 14GA 120MM (NEEDLE) ×2 IMPLANT
PACK LAPAROSCOPY BASIN (CUSTOM PROCEDURE TRAY) ×2 IMPLANT
STRIP CLOSURE SKIN 1/4X3 (GAUZE/BANDAGES/DRESSINGS) ×2 IMPLANT
SUT VIC AB 2-0 UR5 27 (SUTURE) ×2 IMPLANT
TOWEL OR 17X24 6PK STRL BLUE (TOWEL DISPOSABLE) ×4 IMPLANT
TROCAR Z-THREAD BLADED 11X100M (TROCAR) ×2 IMPLANT
WARMER LAPAROSCOPE (MISCELLANEOUS) ×2 IMPLANT
WATER STERILE IRR 1000ML POUR (IV SOLUTION) ×2 IMPLANT

## 2011-06-12 NOTE — Anesthesia Postprocedure Evaluation (Signed)
  Anesthesia Post-op Note  Patient: Rebecca [name]  Procedure(s) Performed: Procedure(s) (LRB): LAPAROSCOPIC TUBAL LIGATION (N/A)  Patient Location: PACU  Anesthesia Type: General  Level of Consciousness: awake, alert  and oriented  Airway and Oxygen Therapy: Patient Spontanous Breathing  Post-op Pain: none  Post-op Assessment: Post-op Vital signs reviewed, Patient's Cardiovascular Status Stable, Respiratory Function Stable, Patent Airway, No signs of Nausea or vomiting and Pain level controlled  Post-op Vital Signs: Reviewed and stable  Complications: No apparent anesthesia complications

## 2011-06-12 NOTE — Discharge Instructions (Signed)
DISCHARGE INSTRUCTIONS: Laparoscopy ° °The following instructions have been prepared to help you care for yourself upon your return home today. ° °Wound care: °• Do not get the incision wet for the first 24 hours. The incision should be kept clean and dry. °• The Band-Aids or dressings may be removed the day after surgery. °• Should the incision become sore, red, and swollen after the first week, check with your doctor. ° °Personal hygiene: °• Shower the day after your procedure. ° °Activity and limitations: °• Do NOT drive or operate any equipment today. °• Do NOT lift anything more than 15 pounds for 2-3 weeks after surgery. °• Do NOT rest in bed all day. °• Walking is encouraged. Walk each day, starting slowly with 5-minute walks 3 or 4 times a day. Slowly increase the length of your walks. °• Walk up and down stairs slowly. °• Do NOT do strenuous activities, such as golfing, playing tennis, bowling, running, biking, weight lifting, gardening, mowing, or vacuuming for 2-4 weeks. Ask your doctor when it is okay to start. ° °Diet: Eat a light meal as desired this evening. You may resume your usual diet tomorrow. ° °Return to work: This is dependent on the type of work you do. For the most part you can return to a desk job within a week of surgery. If you are more active at work, please discuss this with your doctor. ° °What to expect after your surgery: You may have a slight burning sensation when you urinate on the first day. You may have a very small amount of blood in the urine. Expect to have a small amount of vaginal discharge/light bleeding for 1-2 weeks. It is not unusual to have abdominal soreness and bruising for up to 2 weeks. You may be tired and need more rest for about 1 week. You may experience shoulder pain for 24-72 hours. Lying flat in bed may relieve it. ° °Call your doctor for any of the following: °• Develop a fever of 100.4 or greater °• Inability to urinate 6 hours after discharge from  hospital °• Severe pain not relieved by pain medications °• Persistent of heavy bleeding at incision site °• Redness or swelling around incision site after a week °• Increasing nausea or vomiting ° °Patient Signature________________________________________ °Nurse Signature_________________________________________ °

## 2011-06-12 NOTE — Brief Op Note (Signed)
06/12/2011  8:52 AM  PATIENT:  Rebecca [name]  41 y.o. female  PRE-OPERATIVE DIAGNOSIS:  DESIRES STERILIZATION  POST-OPERATIVE DIAGNOSIS:  DESIRES STERILIZATION  PROCEDURE:  Procedure(s) (LRB): LAPAROSCOPIC TUBAL LIGATION (N/A)  SURGEON:  Surgeon(s) and Role:    * Loney Laurence, MD - Primary  PHYSICIAN ASSISTANT:   ASSISTANTS: none   ANESTHESIA:   general  EBL:  Total I/O In: 1000 [I.V.:1000] Out: 5 [Other:5]  BLOOD ADMINISTERED:none  DRAINS: none   LOCAL MEDICATIONS USED:  NONE  SPECIMEN:  No Specimen  DISPOSITION OF SPECIMEN:  N/A  COUNTS:  YES  PLAN OF CARE: Discharge to home after PACU  PATIENT DISPOSITION:  PACU - hemodynamically stable.   Delay start of Pharmacological VTE agent (>24hrs) due to surgical blood loss or risk of bleeding: not applicable  Meds: none  Complications:  none  Findings: Normal uterus and tubes.  Left ovary slightly enlarged but otherwise normal in appearance.  Adhesions midline below umbilicus of omentum.  Normal liver edge.  Possible evidence of R incisional fascial hernia of prior C/S scar.  Technique  After adequate general endotracheal anesthesia was achieved, the patient was prepped and draped in the usual sterile fashion.  A 2 cm incision was made just below the umbilicus and the abdominal wall tented up.  The Veress needle was inserted at a 45 degree angle to the pelvis and no bowel contents or blood were aspirated.  The abdomen was insufflated and the 12 mm trocar placed without complication.  The operative scope was introduced and the above findings noted.  The filchie clip instrument was introduced and the tube were followed to their fimbriated ends bilaterally.  A filchie clip was placed on the isthmic portion of each tube and confirmed visually to be around the entire circumference of each tube.  After a quick inspection of the pelvis and liver edge, all instruments were removed and the abdomen was desufflated.  The  12 mm faschial incision was closed with a figure of eight stitch of 2-vicryl and the skin was closed with dermabond.  All instruments were removed from the vagina and the patient returned to the PACU in stable condition.  Joleene Burnham A

## 2011-06-12 NOTE — Preoperative (Signed)
Beta Blockers   Reason not to administer Beta Blockers:Not Applicable 

## 2011-06-12 NOTE — Transfer of Care (Signed)
Immediate Anesthesia Transfer of Care Note  Patient: Rebecca [name]  Procedure(s) Performed: Procedure(s) (LRB): LAPAROSCOPIC TUBAL LIGATION (N/A)  Patient Location: PACU  Anesthesia Type: General  Level of Consciousness: awake, alert , oriented and patient cooperative  Airway & Oxygen Therapy: Patient Spontanous Breathing and Patient connected to nasal cannula oxygen  Post-op Assessment: Report given to PACU RN and Post -op Vital signs reviewed and stable  Post vital signs: Reviewed  Complications: No apparent anesthesia complications

## 2011-06-12 NOTE — Anesthesia Preprocedure Evaluation (Signed)
Anesthesia Evaluation  Patient identified by MRN, date of birth, ID band Patient awake    Reviewed: Allergy & Precautions, H&P , NPO status , reviewed documented beta blocker date and time   Airway Mallampati: I TM Distance: >3 FB Neck ROM: full    Dental No notable dental hx. (+) Teeth Intact   Pulmonary neg pulmonary ROS,    Pulmonary exam normal       Cardiovascular neg cardio ROS     Neuro/Psych PSYCHIATRIC DISORDERS Anxiety    GI/Hepatic negative GI ROS, Neg liver ROS,   Endo/Other  Negative Endocrine ROS  Renal/GU negative Renal ROS  Genitourinary negative   Musculoskeletal negative musculoskeletal ROS (+)   Abdominal Normal abdominal exam  (+)   Peds negative pediatric ROS (+)  Hematology negative hematology ROS (+)   Anesthesia Other Findings   Reproductive/Obstetrics negative OB ROS                           Anesthesia Physical Anesthesia Plan  ASA: II  Anesthesia Plan: General   Post-op Pain Management:    Induction: Intravenous  Airway Management Planned: Oral ETT  Additional Equipment:   Intra-op Plan:   Post-operative Plan: Extubation in OR  Informed Consent: I have reviewed the patients History and Physical, chart, labs and discussed the procedure including the risks, benefits and alternatives for the proposed anesthesia with the patient or authorized representative who has indicated his/her understanding and acceptance.   Dental Advisory Given  Plan Discussed with: CRNA  Anesthesia Plan Comments:         Anesthesia Quick Evaluation

## 2011-06-12 NOTE — Progress Notes (Signed)
There has been no change in the patients history, status or exam since the history and physical.  Filed Vitals:   06/12/11 0715  BP: 113/77  Pulse: 101  Temp: 98.6 F (37 C)  TempSrc: Oral  Resp: 16  SpO2: 100%    Lab Results  Component Value Date   WBC 9.3 06/04/2011   HGB 12.5 06/04/2011   HCT 39.5 06/04/2011   MCV 83.9 06/04/2011   PLT 265 06/04/2011    Rebecca [name] A

## 2011-06-12 NOTE — Op Note (Signed)
06/12/2011  8:52 AM  PATIENT:  Rebecca [name]  40 y.o. female  PRE-OPERATIVE DIAGNOSIS:  DESIRES STERILIZATION  POST-OPERATIVE DIAGNOSIS:  DESIRES STERILIZATION  PROCEDURE:  Procedure(s) (LRB): LAPAROSCOPIC TUBAL LIGATION (N/A)  SURGEON:  Surgeon(s) and Role:    * Ruqayya Ventress A Thatcher Doberstein, MD - Primary  PHYSICIAN ASSISTANT:   ASSISTANTS: none   ANESTHESIA:   general  EBL:  Total I/O In: 1000 [I.V.:1000] Out: 5 [Other:5]  BLOOD ADMINISTERED:none  DRAINS: none   LOCAL MEDICATIONS USED:  NONE  SPECIMEN:  No Specimen  DISPOSITION OF SPECIMEN:  N/A  COUNTS:  YES  PLAN OF CARE: Discharge to home after PACU  PATIENT DISPOSITION:  PACU - hemodynamically stable.   Delay start of Pharmacological VTE agent (>24hrs) due to surgical blood loss or risk of bleeding: not applicable  Meds: none  Complications:  none  Findings: Normal uterus and tubes.  Left ovary slightly enlarged but otherwise normal in appearance.  Adhesions midline below umbilicus of omentum.  Normal liver edge.  Possible evidence of R incisional fascial hernia of prior C/S scar.  Technique  After adequate general endotracheal anesthesia was achieved, the patient was prepped and draped in the usual sterile fashion.  A 2 cm incision was made just below the umbilicus and the abdominal wall tented up.  The Veress needle was inserted at a 45 degree angle to the pelvis and no bowel contents or blood were aspirated.  The abdomen was insufflated and the 12 mm trocar placed without complication.  The operative scope was introduced and the above findings noted.  The filchie clip instrument was introduced and the tube were followed to their fimbriated ends bilaterally.  A filchie clip was placed on the isthmic portion of each tube and confirmed visually to be around the entire circumference of each tube.  After a quick inspection of the pelvis and liver edge, all instruments were removed and the abdomen was desufflated.  The  12 mm faschial incision was closed with a figure of eight stitch of 2-vicryl and the skin was closed with dermabond.  All instruments were removed from the vagina and the patient returned to the PACU in stable condition.  Zionah Criswell A      

## 2011-06-13 ENCOUNTER — Encounter (HOSPITAL_COMMUNITY): Payer: Self-pay | Admitting: Obstetrics and Gynecology

## 2011-09-06 ENCOUNTER — Encounter: Payer: Self-pay | Admitting: Cardiovascular Disease

## 2011-09-09 ENCOUNTER — Encounter: Payer: Self-pay | Admitting: Cardiovascular Disease

## 2011-09-09 ENCOUNTER — Ambulatory Visit (INDEPENDENT_AMBULATORY_CARE_PROVIDER_SITE_OTHER): Payer: BC Managed Care – PPO | Admitting: Cardiovascular Disease

## 2011-09-09 DIAGNOSIS — R9431 Abnormal electrocardiogram [ECG] [EKG]: Secondary | ICD-10-CM

## 2011-09-09 DIAGNOSIS — R079 Chest pain, unspecified: Secondary | ICD-10-CM | POA: Insufficient documentation

## 2011-09-09 NOTE — Assessment & Plan Note (Addendum)
When the as had several episodes of chest pain. These episodes were quite severe. She has a left bundle branch block which is fairly new. At this point I think we should proceed with a Lexiscan Myoview study for further evaluation.  Procedure back in the office in several months. We'll see her sooner if there's any abnormality on the  myoview study.  Addendum: 09/12/11 The stress Myoview study returned with some mild abnormalities. Overall Impression: The overall assessment is difficult. The patient has an interventricular conduction delay of the left bundle branch block type. This can give a false abnormality in the area of the septum. In addition there is increased nuclear activity from structures below the diaphragm. This makes the inferolateral wall brighter, and gives the impression of decreased counts in the anterior wall and anteroseptal wall. The defects outlined above raise the question of scar with some ischemia in the mid, apical anteroseptal wall and mid, apical anterior wall. There will have to be a judgment call based on the patient's overall clinical status to decide if this abnormality is related to the left bundle or not. Scar and ischemia cannot be ruled out  LV Ejection Fraction: 56%. LV Wall Motion: There is some hypokinesis of the septum.  Tinnie Gens Katz,MD  My interpretation of the images is as follows. She has normal left ventricular systolic function. There is slightly decreased uptake in the anterior wall both the stress and rest. This is likely due to breast attenuation and also attenuation from the left bundle branch block.  The inferior wall attenuation cannot be assessed because there is significant uptake in the structures below the diaphragm which causes the inferior defects.  I talked with Toniann Fail today. She's been more attention to when she has these episodes of chest pain. It seems that these episodes of chest pain occur more with stressful situations and not with exercise.  She's scheduled to see me again in one month. She'll continue to exercise on a regular basis. She developed any episodes of chest pain with exertion and she is to call right away or call 911 and go to the emergency room.  Her chart from 2009 was located. The left bundle branch block is clearly new.

## 2011-09-09 NOTE — Patient Instructions (Signed)
Your physician has requested that you have a lexiscan myoview.  Please follow instruction sheet, as given.  Your physician recommends that you schedule a follow-up appointment in: 4-6 weeks

## 2011-09-09 NOTE — Progress Notes (Signed)
    Rebecca [name] Date of Birth  06/09/1970       [facility] Office [address], [address]  [address], [address] [place], [place]  [address]   [place], [place]  [address] [phone]     [phone]   Fax  [phone]    Fax [phone]  Problem List: Chest pain 2. left bundle branch block/nonspecific IVCD 3. Anxiety 4.   History of Present Illness:  Rebecca [name] awoke with chest pain 3 days ago.  It was described as a tightness. The pain lasted for much of the day and gradually ease.  There was no shortness of breath. There is no nausea or vomiting. No diaphoresis. She had  chest pain off and on over the weekend.  She's up-to-date on her mammograms.  The pain is not worsened with lifting or pushing things  Current Outpatient Prescriptions on File Prior to Visit  Medication Sig Dispense Refill  . ibuprofen (ADVIL,MOTRIN) 200 MG tablet Take 800 mg by mouth daily as needed. For pain      . LORazepam (ATIVAN) 0.5 MG tablet Take 0.5 mg by mouth daily as needed.      . Topiramate (TOPAMAX PO) Take 100 mg by mouth daily.          No Known Allergies  Past Medical History  Diagnosis Date  . Migraines   . Anxiety     Past Surgical History  Procedure Date  . Mini tuck 2012  . Cesarean section 2007/2010  . 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 Wisdom tooth extraction   . Laparoscopic tubal ligation 06/12/2011    Procedure: LAPAROSCOPIC TUBAL LIGATION;  Surgeon: Loney Laurence, MD;  Location: [facility];  Service: Gynecology;  Laterality: N/A;  filshie clip    History  Smoking status  . Former Smoker -- 0.2 packs/day for 6 years  . Types: Cigarettes  . Quit date: 03/29/2005  Smokeless tobacco  . Never Used    History  Alcohol Use  . Yes    socially    Family History  Problem Relation Age of Onset  . Lymphoma Father   . Diabetes Brother     Reviw of Systems:  Reviewed in the HPI.  All other systems are  negative.  Physical Exam: Blood pressure 134/90, pulse 73, height 5\' 10"  (1.778 m), weight 179 lb 6.4 oz (81.375 kg). General: Well developed, well nourished, in no acute distress.  Head: Normocephalic, atraumatic, sclera non-icteric, mucus membranes are moist,   Neck: Supple. Carotids are 2 + without bruits. No JVD  Lungs: Clear bilaterally to auscultation.  Heart: regular rate.  normal  S1 S2. No murmurs, gallops or rubs.  Abdomen: Soft, non-tender, non-distended with normal bowel sounds. No hepatomegaly. No rebound/guarding. No masses.  Msk:  Strength and tone are normal  Extremities: No clubbing or cyanosis. No edema.  Distal pedal pulses are 2+ and equal bilaterally.  Neuro: Alert and oriented X 3. Moves all extremities spontaneously.  Psych:  Responds to questions appropriately with a normal affect.  ECG: 09/09/2011-normal sinus rhythm at 73 beats a minute. She has nonspecific IVCD vs.  left bundle branch block.  Assessment / Plan:

## 2011-09-10 ENCOUNTER — Encounter: Payer: Self-pay | Admitting: *Deleted

## 2011-09-11 ENCOUNTER — Ambulatory Visit (HOSPITAL_COMMUNITY): Payer: BC Managed Care – PPO | Attending: Cardiology | Admitting: Radiology

## 2011-09-11 DIAGNOSIS — R0789 Other chest pain: Secondary | ICD-10-CM | POA: Insufficient documentation

## 2011-09-11 DIAGNOSIS — R079 Chest pain, unspecified: Secondary | ICD-10-CM

## 2011-09-11 DIAGNOSIS — Z87891 Personal history of nicotine dependence: Secondary | ICD-10-CM | POA: Insufficient documentation

## 2011-09-11 DIAGNOSIS — R9431 Abnormal electrocardiogram [ECG] [EKG]: Secondary | ICD-10-CM | POA: Insufficient documentation

## 2011-09-11 DIAGNOSIS — I447 Left bundle-branch block, unspecified: Secondary | ICD-10-CM | POA: Insufficient documentation

## 2011-09-11 MED ORDER — ADENOSINE (DIAGNOSTIC) 3 MG/ML IV SOLN
0.5600 mg/kg | Freq: Once | INTRAVENOUS | Status: AC
  Administered 2011-09-11: 44.4 mg via INTRAVENOUS

## 2011-09-11 MED ORDER — TECHNETIUM TC 99M TETROFOSMIN IV KIT
11.0000 | PACK | Freq: Once | INTRAVENOUS | Status: AC | PRN
  Administered 2011-09-11: 11 via INTRAVENOUS

## 2011-09-11 MED ORDER — TECHNETIUM TC 99M TETROFOSMIN IV KIT
33.0000 | PACK | Freq: Once | INTRAVENOUS | Status: AC | PRN
  Administered 2011-09-11: 33 via INTRAVENOUS

## 2011-09-11 NOTE — Progress Notes (Signed)
[facility] NUCLEAR MED [address] [place] [address] [phone]  Cardiology Nuclear Med Study  Rebecca [name] is a 41 y.o. female     MRN : [id number]     DOB: 1970-07-25  Procedure Date: 09/11/2011  Nuclear Med Background Indication for Stress Test:  Evaluation for Ischemia and Abnormal EKG History:  ~72yrs ago YQI:HKVQQV per patient. Cardiac Risk Factors: History of Smoking and LBBB  Symptoms:  Chest Pain/Tightness (last episode of chest discomfort was yesterday)   Nuclear Pre-Procedure Caffeine/Decaff Intake:  None NPO After: 8:00am   Lungs:  clear O2 Sat: 100% on room air. IV 0.9% NS with Angio Cath:  20g  IV Site: R Antecubital  IV Started by:  Stanton Kidney, EMT-P  Chest Size (in):  36 Cup Size: C  Height: 5\' 10"  (1.778 m)  Weight:  175 lb (79.379 kg)  BMI:  Body mass index is 25.11 kg/(m^2). Tech Comments:  NA    Nuclear Med Study 1 or 2 day study: 1 day  Stress Test Type:  Adenosine  Reading MD: Willa Rough, MD  Order Authorizing Provider:  Kristeen Miss, MD  Resting Radionuclide: Technetium 23m Tetrofosmin  Resting Radionuclide Dose: 11.0 mCi   Stress Radionuclide:  Technetium 101m Tetrofosmin  Stress Radionuclide Dose: 33.0 mCi           Stress Protocol Rest HR: 73 Stress HR: 110  Rest BP: 115/85 Stress BP: 120/71  Exercise Time (min): n/a METS: n/a   Predicted Max HR: 179 bpm % Max HR: 61.45 bpm Rate Pressure Product: 95638   Dose of Adenosine (mg):  44.5 Dose of Lexiscan: n/a mg  Dose of Atropine (mg): n/a Dose of Dobutamine: n/a mcg/kg/min (at max HR)  Stress Test Technologist: Smiley Houseman, CMA-N  Nuclear Technologist:  Domenic Polite, CNMT     Rest Procedure:  Myocardial perfusion imaging was performed at rest 45 minutes following the intravenous administration of Technetium 68m Tetrofosmin.  Rest ECG: LBBB  Stress Procedure:  The patient received IV adenosine at 140 mcg/kg/min for 4 minutes.  There were no  significant changes with infusion, occasional PVC's were noted.  She c/o chest tightness, 8/10, with infusion.  Technetium 23m Tetrofosmin was injected at the 2 minute mark and quantitative spect images were obtained after a 45 minute delay.  Stress ECG: No significant change from baseline ECG  QPS Raw Data Images:  Normal; no motion artifact; normal heart/lung ratio. Stress Images:  There is moderate decrease in photon activity in a moderate area including the mid, apical anteroseptal segments and mid, apical anterior segments. Rest Images:  The rest images show improvement in the decreased activity in the mid, apical anterior segments. Subtraction (SDS):  There is suggestion of some ischemia at the mid, apical anterior wall. Transient Ischemic Dilatation (Normal <1.22):  1.07 Lung/Heart Ratio (Normal <0.45):  0.30  Quantitative Gated Spect Images QGS EDV:  121 ml QGS ESV:  54 ml  Impression Exercise Capacity:  Adenosine study with no exercise. BP Response:  Normal blood pressure response. Clinical Symptoms:  Chest tightness 8/10 ECG Impression:  No significant ST segment change suggestive of ischemia. Comparison with Prior Nuclear Study: No previous nuclear study performed  Overall Impression:  The overall assessment is difficult. The patient has an interventricular conduction delay of the left bundle branch block type. This can give a false abnormality in the area of the septum. In addition there is increased nuclear activity from structures below the diaphragm.  This makes the inferolateral wall brighter, and gives the impression of decreased counts in the anterior wall and anteroseptal wall. The defects outlined above raise the question of scar with some ischemia in the mid, apical anteroseptal  wall and mid, apical anterior wall. There will have to be a judgment call based on the patient's overall clinical status to decide if this abnormality is related to the left bundle or not. Scar and  ischemia cannot be ruled out  LV Ejection Fraction: 56%.  LV Wall Motion:  There is some hypokinesis of the septum.  Tinnie Gens Katz,MD

## 2011-09-12 ENCOUNTER — Telehealth: Payer: Self-pay | Admitting: Cardiovascular Disease

## 2011-09-12 ENCOUNTER — Ambulatory Visit: Payer: BC Managed Care – PPO | Admitting: Cardiovascular Disease

## 2011-09-12 NOTE — Telephone Encounter (Signed)
New msg Pt wants to know stress results. Please call her back

## 2011-09-12 NOTE — Telephone Encounter (Signed)
Dr Elease Hashimoto called her with results

## 2011-09-16 ENCOUNTER — Encounter: Payer: Self-pay | Admitting: *Deleted

## 2011-10-24 ENCOUNTER — Ambulatory Visit: Payer: BC Managed Care – PPO | Admitting: Cardiovascular Disease

## 2011-11-25 ENCOUNTER — Emergency Department (HOSPITAL_BASED_OUTPATIENT_CLINIC_OR_DEPARTMENT_OTHER)
Admission: EM | Admit: 2011-11-25 | Discharge: 2011-11-25 | Disposition: A | Discharge status: Home / Self Care | Payer: BC Managed Care – PPO | Attending: Emergency Medicine | Admitting: Emergency Medicine

## 2011-11-25 ENCOUNTER — Encounter (HOSPITAL_BASED_OUTPATIENT_CLINIC_OR_DEPARTMENT_OTHER): Payer: Self-pay | Admitting: Emergency Medicine

## 2011-11-25 DIAGNOSIS — Z87891 Personal history of nicotine dependence: Secondary | ICD-10-CM | POA: Insufficient documentation

## 2011-11-25 DIAGNOSIS — F411 Generalized anxiety disorder: Secondary | ICD-10-CM | POA: Insufficient documentation

## 2011-11-25 DIAGNOSIS — K439 Ventral hernia without obstruction or gangrene: Secondary | ICD-10-CM

## 2011-11-25 LAB — URINALYSIS, ROUTINE W REFLEX MICROSCOPIC
Bilirubin Urine: NEGATIVE
Glucose, UA: NEGATIVE mg/dL
Hgb urine dipstick: NEGATIVE
Ketones, ur: NEGATIVE mg/dL
Leukocytes, UA: NEGATIVE
Protein, ur: NEGATIVE mg/dL

## 2011-11-25 NOTE — ED Notes (Signed)
C/o of lower abd pain that is intermittent in narutre since Sat. Hx of rt lower abd hernia Denies urinary frequency, burning, denies vag d/c

## 2011-11-25 NOTE — ED Provider Notes (Signed)
History     CSN: [id number]  Arrival date & time 11/25/11  0756   First MD Initiated Contact with Patient 11/25/11 [phone]      Chief Complaint  Patient presents with  . Abdominal Pain    (Consider location/radiation/quality/duration/timing/severity/associated sxs/prior treatment) HPI Patient is a 41 yo female who presents today complaining of LLQ abdominal bulge and discomfort.  Patient has a history of a right-sided abdominal wall hernia that was difficult to previously diagnose as it spontaneously reduces upon standing.  Patient has seen Dr. Derrell Lolling for this but has not yet undertaken elective repair.  She endorses only intermittent pain with this and is not hurting at this time.  She had last BM this morning and does have a bulge that is notable in the left lower abdomen notable upon standing but reducible when patient lies down.  She denies nausea and vomiting. There are no other associated or modifying factors.  Past Medical History  Diagnosis Date  . Migraines   . Anxiety     Past Surgical History  Procedure Date  . Mini tuck 2012  . Cesarean section 2007/2010  . 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 Wisdom tooth extraction   . Laparoscopic tubal ligation 06/12/2011    Procedure: LAPAROSCOPIC TUBAL LIGATION;  Surgeon: Loney Laurence, MD;  Location: [facility];  Service: Gynecology;  Laterality: N/A;  filshie clip  . US echocardiography 12-30-2007    EF 55-60%    Family History  Problem Relation Age of Onset  . Lymphoma Father   . Diabetes Brother     History  Substance Use Topics  . Smoking status: Former Smoker -- 0.2 packs/day for 6 years    Types: Cigarettes    Quit date: 03/29/2005  . Smokeless tobacco: Never Used  . Alcohol Use: Yes     socially    OB History    Grav Para Term Preterm Abortions TAB SAB Ect Mult Living                  Review of Systems  Constitutional: Negative.   HENT: Negative.   Eyes: Negative.   Respiratory:  Negative.   Cardiovascular: Negative.   Gastrointestinal: Positive for abdominal pain.  Genitourinary: Negative.   Musculoskeletal: Negative.   Skin: Negative.   Neurological: Negative.   Hematological: Negative.   Psychiatric/Behavioral: Negative.   All other systems reviewed and are negative.    Allergies  Review of patient's allergies indicates no known allergies.  Home Medications   Current Outpatient Rx  Name Route Sig Dispense Refill  . BUPROPION HCL ER (SR) 150 MG PO TB12 Oral Take 150 mg by mouth 2 (two) times daily.    Marland Kitchen ESOMEPRAZOLE MAGNESIUM 40 MG PO CPDR Oral Take 40 mg by mouth daily before breakfast.    . IBUPROFEN 200 MG PO TABS Oral Take 800 mg by mouth daily as needed. For pain    . LORAZEPAM 0.5 MG PO TABS Oral Take 0.5 mg by mouth daily as needed.    . TOPAMAX PO Oral Take 100 mg by mouth daily.        BP 140/94  Pulse 82  Temp 98.2 F (36.8 C) (Oral)  Resp 20  Ht 5\' 10"  (1.778 m)  Wt 178 lb (80.74 kg)  BMI 25.54 kg/m2  SpO2 100%  LMP 11/05/2011  Physical Exam  Nursing note and vitals reviewed. GEN: Well-developed, well-nourished female in no distress HEENT: Atraumatic, normocephalic. Oropharynx  clear without erythema EYES: PERRLA BL, no scleral icterus. NECK: Trachea midline, no meningismus CV: regular rate and rhythm. No murmurs, rubs, or gallops PULM: No respiratory distress.  No crackles, wheezes, or rales. GI: soft, non-tender. No guarding, rebound, or tenderness. + bowel sounds. Large bulge in the RLQ measuring 4 inches in diameter.  This is consistent with hernia and spontaneously reduces when the patient lays down.  It is only notable on exam while standing.  Patient also has a LLQ bulge that is 2 inches in diameter and similarly is notable only on standing and reduces spontaneously when the patient lays down.    GU: deferred Neuro: cranial nerves grossly 2-12 intact, no abnormalities of strength or sensation, A and O x 3 MSK: Patient moves  all 4 extremities symmetrically, no deformity, edema, or injury noted Skin: No rashes petechiae, purpura, or jaundice Psych: no abnormality of mood   ED Course  Procedures (including critical care time)   Labs Reviewed  URINALYSIS, ROUTINE W REFLEX MICROSCOPIC  PREGNANCY, URINE   No results found.   1. Hernia of abdominal wall       MDM  Patient complained of lower abdominal discomfort. UA and urine preg were performed and unremarkable.  Patient did appear to have to abdominal wall hernias which were both spontaneously reducible.  Patient was told to follow-up again with Dr. Derrell Lolling.  She did not feel any pain medication was necessary today.  Patient was discharged in good condition.        Cyndra Numbers, MD 11/25/11 2241

## 2011-11-26 ENCOUNTER — Telehealth (INDEPENDENT_AMBULATORY_CARE_PROVIDER_SITE_OTHER): Payer: Self-pay | Admitting: General Surgery

## 2011-11-26 NOTE — Telephone Encounter (Signed)
Called patient home/cell number. Left message for call to be returned. Advised she was being called at the request of Dr. Derrell Lolling to follow up on hospital visit 11/25/11. Calling to determine if the current appointment in the system needs to be moved up or not.  Per Dr. Derrell Lolling if CT abdomen has not been done in last 10-12 months it needs to be repeated.

## 2011-11-27 ENCOUNTER — Telehealth (INDEPENDENT_AMBULATORY_CARE_PROVIDER_SITE_OTHER): Payer: Self-pay | Admitting: General Surgery

## 2011-11-27 ENCOUNTER — Other Ambulatory Visit (INDEPENDENT_AMBULATORY_CARE_PROVIDER_SITE_OTHER): Payer: Self-pay | Admitting: General Surgery

## 2011-11-27 DIAGNOSIS — K439 Ventral hernia without obstruction or gangrene: Secondary | ICD-10-CM

## 2011-11-27 NOTE — Telephone Encounter (Signed)
Called patient to advise that Dr. Derrell Lolling stated he wanted the test repeated. Advised patient the test request was submitted this morning and gave her the contact number to [facility] to set up the appointment based on what works for her schedule. Advised her that the test needs to be done at least a week before appointment with Dr. Derrell Lolling in order for the results to be received and reviewed. Patient agreed.

## 2011-11-29 ENCOUNTER — Ambulatory Visit
Admission: RE | Admit: 2011-11-29 | Discharge: 2011-11-29 | Disposition: A | Discharge status: Home / Self Care | Payer: BC Managed Care – PPO | Source: Ambulatory Visit | Attending: General Surgery | Admitting: General Surgery

## 2011-11-29 DIAGNOSIS — K439 Ventral hernia without obstruction or gangrene: Secondary | ICD-10-CM

## 2011-11-29 IMAGING — CT CT ABD-PELV W/ CM
3 of 5 series · 13 of 36 positions shown, 19 images · IV contrast (READICAT/WATER & [ID] OMNI 300)
Comparison: [DATE]

CLINICAL DATA: Painful lower abdominal masses, evaluate for hernia

CT ABDOMEN AND PELVIS WITH CONTRAST
TECHNIQUE: Multidetector CT imaging of the abdomen and pelvis was
performed following the standard protocol during bolus
administration of intravenous contrast.
Contrast: 100mL OMNIPAQUE IOHEXOL 300 MG/ML  SOLN

[Series 3: abd/pelvis with · axial · 0.86mm/px · z∈[-345,-20]mm · 6 of 91 slices shown, 11 images]
[im 13/91  soft-tissue]
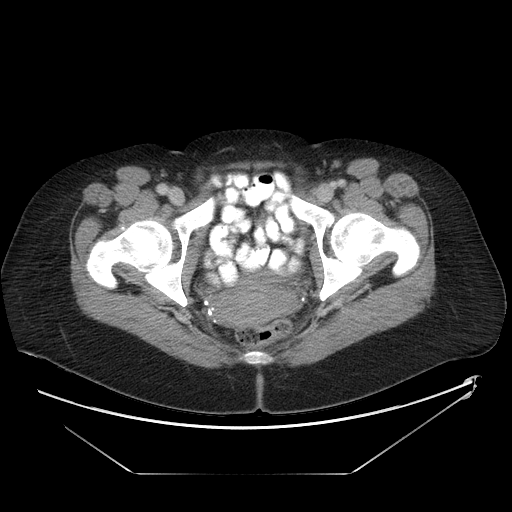
[im 13/91  bone]
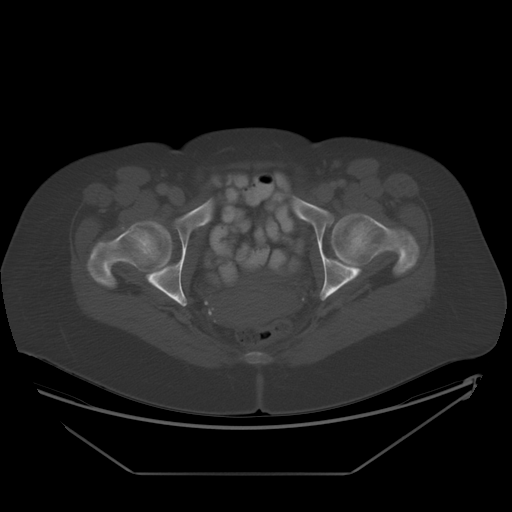
[im 26/91  soft-tissue]
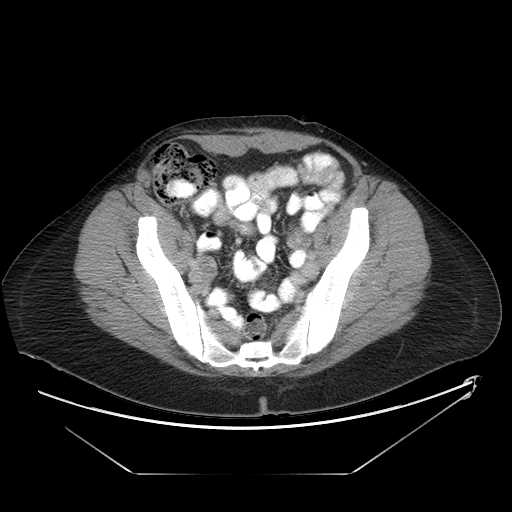
[im 39/91  soft-tissue]
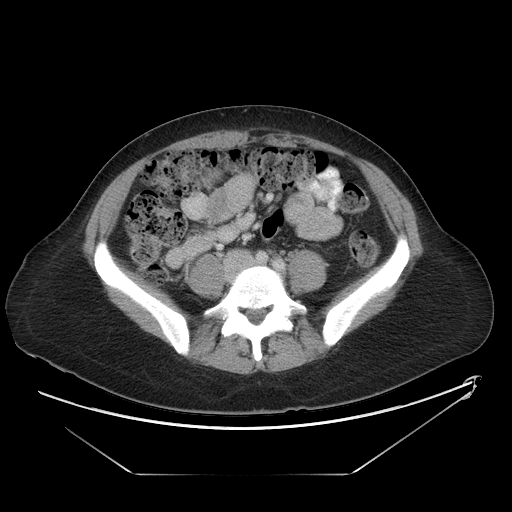
[im 39/91  lung]
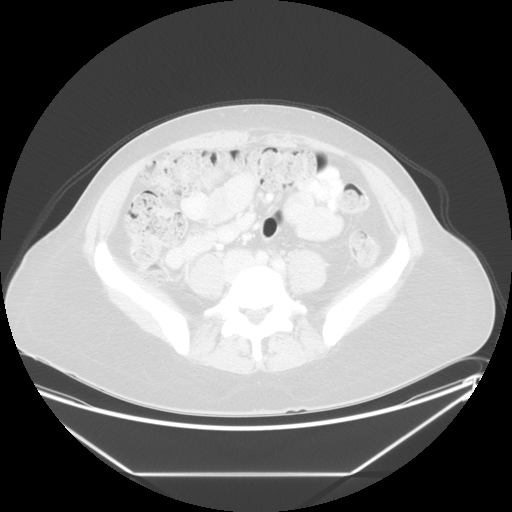
[im 52/91  soft-tissue]
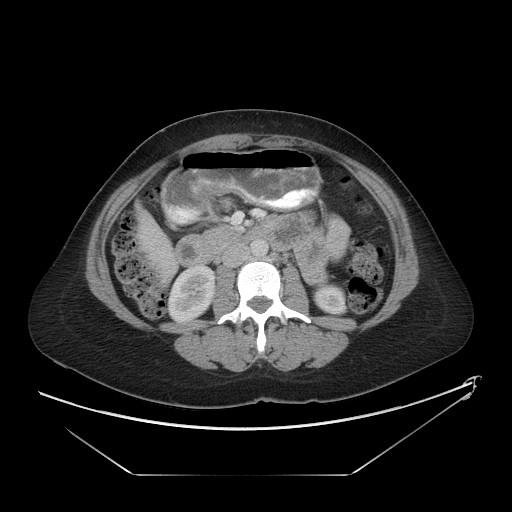
[im 52/91  lung]
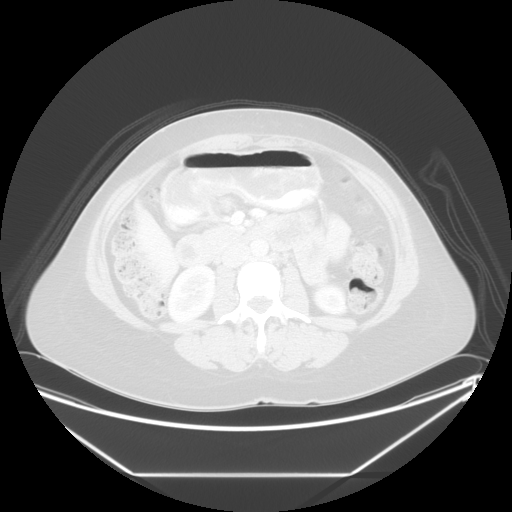
[im 65/91  soft-tissue]
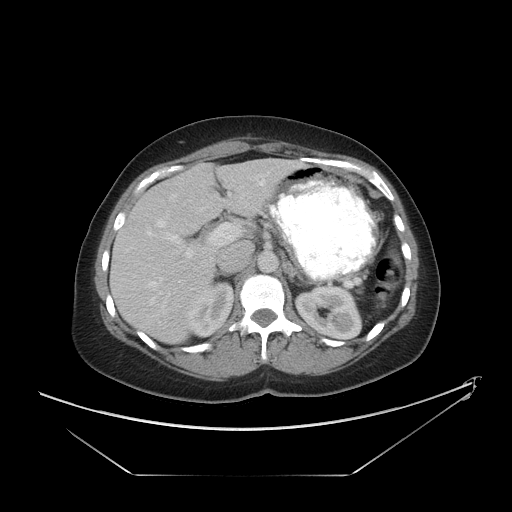
[im 65/91  lung]
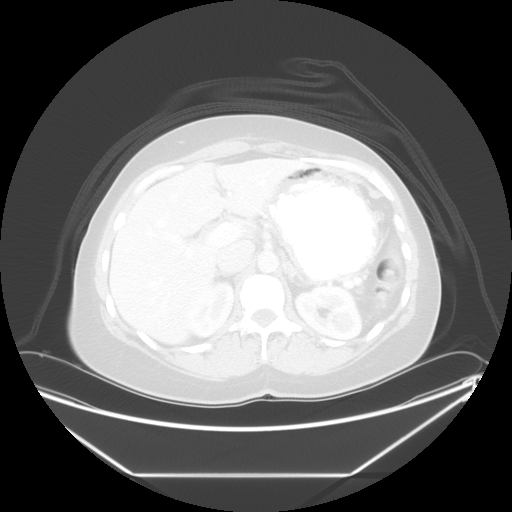
[im 78/91  soft-tissue]
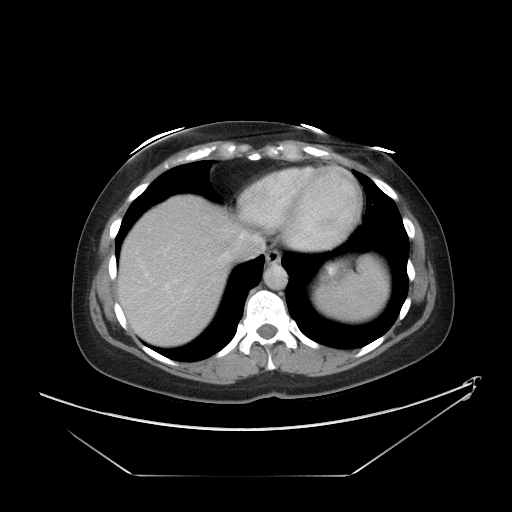
[im 78/91  lung]
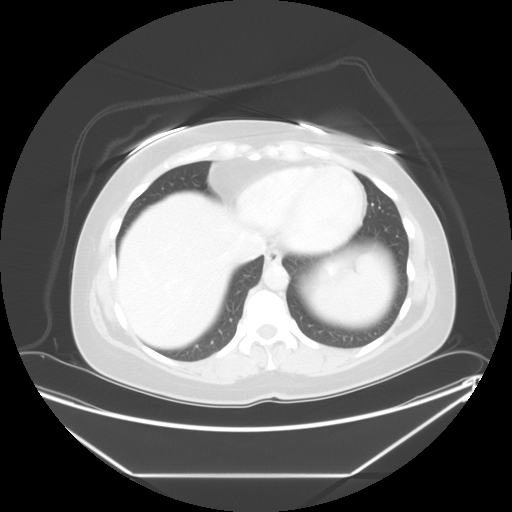

[Series 601: coronal body · coronal · 0.89mm/px · 1 of 119 slices shown, 2 images]
[im 40/119  soft-tissue]
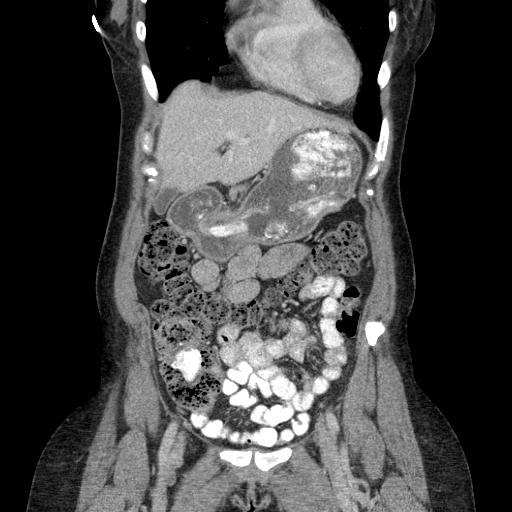
[im 40/119  bone]
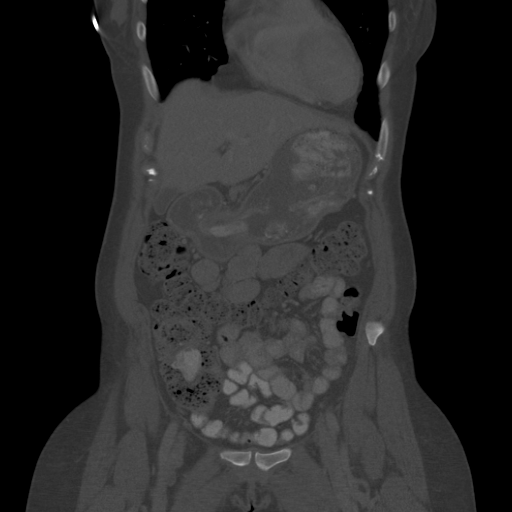

[Series 602: sagittal body · sagittal · 0.89mm/px · 6 of 175 slices shown]
[im 21/175  soft-tissue]
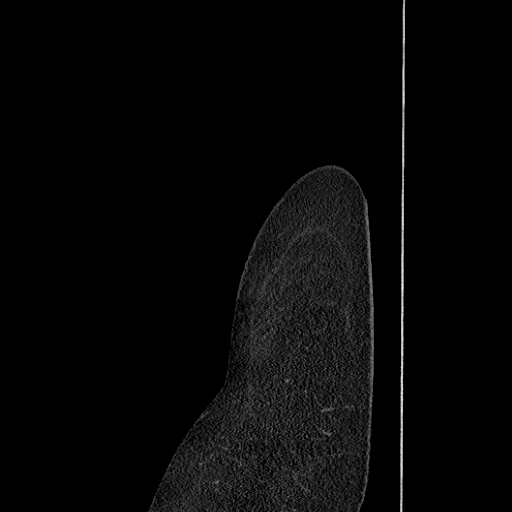
[im 41/175  soft-tissue]
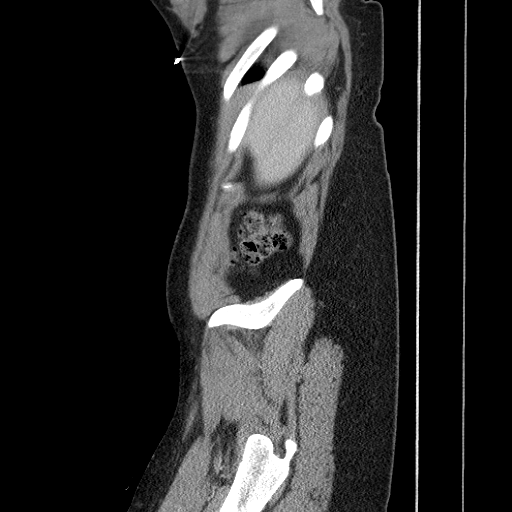
[im 62/175  soft-tissue]
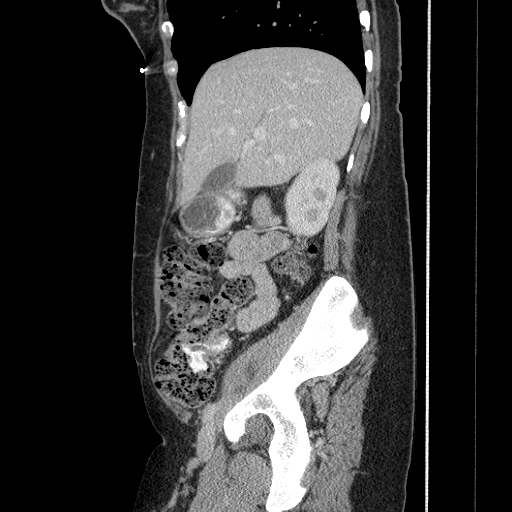
[im 82/175  soft-tissue]
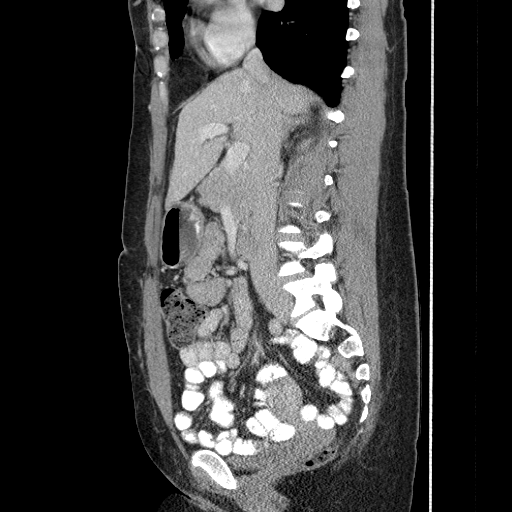
[im 103/175  soft-tissue]
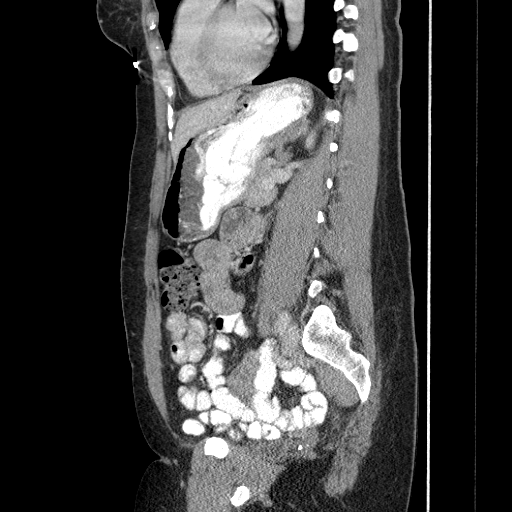
[im 123/175  soft-tissue]
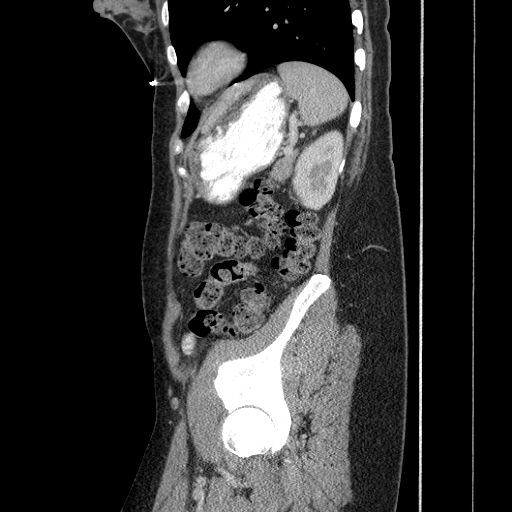

[13 of 36 positions shown; findings below may reference images not displayed]

FINDINGS: Lungs are clear.

Liver, spleen, pancreas, and adrenal glands are within normal
limits.

Kidneys are within normal limits.  No hydronephrosis.

No evidence of bowel obstruction.  Normal appendix.

No evidence of abdominal aortic aneurysm.

No abdominopelvic ascites.

No suspicious abdominopelvic lymphadenopathy.

Uterus and bilateral ovaries are unremarkable.  Bilateral tubal
ligation clips.

Bladder is within normal limits.

Mild laxity of the right lower abdominal wall (series 3/image 64).
No abdominal wall hernia is seen.
IMPRESSION: Mild laxity of the right lower abdominal wall.  No abdominal wall
hernia is seen.

## 2011-11-29 MED ORDER — IOHEXOL 300 MG/ML  SOLN
100.0000 mL | Freq: Once | INTRAMUSCULAR | Status: AC | PRN
  Administered 2011-11-29: 100 mL via INTRAVENOUS

## 2011-12-24 ENCOUNTER — Ambulatory Visit (INDEPENDENT_AMBULATORY_CARE_PROVIDER_SITE_OTHER): Payer: BC Managed Care – PPO | Admitting: General Surgery

## 2011-12-24 ENCOUNTER — Encounter (INDEPENDENT_AMBULATORY_CARE_PROVIDER_SITE_OTHER): Payer: Self-pay | Admitting: General Surgery

## 2011-12-24 VITALS — BP 138/82 | HR 84 | Temp 97.7°F | Resp 16 | Ht 70.0 in | Wt 180.2 lb

## 2011-12-24 DIAGNOSIS — K439 Ventral hernia without obstruction or gangrene: Secondary | ICD-10-CM

## 2011-12-24 NOTE — Progress Notes (Signed)
Patient ID: Rebecca [name], female   DOB: 04/25/1971, 41 y.o.   MRN: [id number]  Chief Complaint  Patient presents with  . Pre-op Exam    hernia  . Follow-up    hernia    HPI Rebecca [name] is a 41 y.o. female.  She returns for further evaluation of her abdominal wall bulges and possible developing hernias.  This is a pleasant 41 year old Caucasian female, originally referred to me by Aquilla Hacker last year  for evaluation of right lower quadrant abdominal wall mass.  The patient states that she has had 2 C-sections in the past. The last was in 2007. She's noticed a bulge in her right lower quadrant since that time.  Evaluated by Dr. Carolynne Edouard in September 2011 and had a physical exam twice and had a CT scan and no mass and no hernia was identified either clinically or radiographically.  She had abdominoplasty by Dr. Ellery Plunk in April 2012. She says he did not find anything at that time. She continues to have a painless lump but is concerned about the visual appearance and she is convinced that she has a hernia. No change in her bowel habits. Otherwise healthy.  Her primary care physician is Dr. Merri Brunette. Her gynecologist is Dr. Carrington Clamp.  She recently went to the emergency room because of severe suprapubic pain. She states that B. Richard Dr. Though she might have a hernia in the midline. She states the pain resolved. She states the larger bulge in the right lower quadrant does not bother her at all.  She recently had a laparoscopic tubal ligation. Dr. Henderson Cloud noted some bowing of the abdominal muscles and possible developing hernia in the right lower quadrant.  She is asymptomatic now but is concerned about the recent episode of pain but she might have a second hernia.  We went ahead and got a CT scan on August 2. That shows thinning and laxity of the right lower quadrant abdominal muscles. Radiographically this is more noticeable it was before. There is no hernia in the midline. No  visceral pathology or adenopathy. No inguinal hernia was detected.  HPI  Past Medical History  Diagnosis Date  . Migraines   . Anxiety   . GERD (gastroesophageal reflux disease)     Past Surgical History  Procedure Date  . Mini tuck 2012  . Cesarean section 2007/2010  . 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 Wisdom tooth extraction   . Laparoscopic tubal ligation 06/12/2011    Procedure: LAPAROSCOPIC TUBAL LIGATION;  Surgeon: Loney Laurence, MD;  Location: [facility];  Service: Gynecology;  Laterality: N/A;  filshie clip  . US echocardiography 12-30-2007    EF 55-60%    Family History  Problem Relation Age of Onset  . Lymphoma Father   . Cancer Father     lymphoma  . Diabetes Brother     Social History History  Substance Use Topics  . Smoking status: Former Smoker -- 0.2 packs/day for 6 years    Types: Cigarettes    Quit date: 03/29/2005  . Smokeless tobacco: Never Used  . Alcohol Use: Yes     socially    No Known Allergies  Current Outpatient Prescriptions  Medication Sig Dispense Refill  . buPROPion (WELLBUTRIN SR) 150 MG 12 hr tablet Take 150 mg by mouth 2 (two) times daily.      Marland Kitchen esomeprazole (NEXIUM) 40 MG capsule Take 40 mg by mouth daily before breakfast.      .  ibuprofen (ADVIL,MOTRIN) 200 MG tablet Take 800 mg by mouth daily as needed. For pain      . LORazepam (ATIVAN) 0.5 MG tablet Take 0.5 mg by mouth daily as needed.      . Topiramate (TOPAMAX PO) Take 100 mg by mouth daily.          Review of Systems Review of Systems  Constitutional: Negative for fever, chills and unexpected weight change.  HENT: Negative for hearing loss, congestion, sore throat, trouble swallowing and voice change.   Eyes: Negative for visual disturbance.  Respiratory: Negative for cough and wheezing.   Cardiovascular: Negative for chest pain, palpitations and leg swelling.  Gastrointestinal: Positive for abdominal pain and abdominal distention. Negative for  nausea, vomiting, diarrhea, constipation, blood in stool and anal bleeding.  Genitourinary: Negative for hematuria, vaginal bleeding and difficulty urinating.  Musculoskeletal: Negative for arthralgias.  Skin: Negative for rash and wound.  Neurological: Negative for seizures, syncope and headaches.  Hematological: Negative for adenopathy. Does not bruise/bleed easily.  Psychiatric/Behavioral: Negative for confusion. The patient is nervous/anxious.     Blood pressure 138/82, pulse 84, temperature 97.7 F (36.5 C), temperature source Temporal, resp. rate 16, height 5\' 10"  (1.778 m), weight 180 lb 3.2 oz (81.738 kg), last menstrual period 11/05/2011.  Physical Exam Physical Exam  Constitutional: She is oriented to person, place, and time. She appears well-developed and well-nourished. No distress.  HENT:  Head: Normocephalic and atraumatic.  Nose: Nose normal.  Mouth/Throat: No oropharyngeal exudate.  Eyes: Conjunctivae and EOM are normal. Pupils are equal, round, and reactive to light. Left eye exhibits no discharge. No scleral icterus.  Neck: Neck supple. No JVD present. No tracheal deviation present. No thyromegaly present.  Cardiovascular: Normal rate, regular rhythm, normal heart sounds and intact distal pulses.   No murmur heard. Pulmonary/Chest: Effort normal and breath sounds normal. No respiratory distress. She has no wheezes. She has no rales. She exhibits no tenderness.  Abdominal: Soft. Bowel sounds are normal. She exhibits no distension and no mass. There is no tenderness. There is no rebound and no guarding.         Abdominoplasty scars healed. Umbilicus normal. Baseball-sized bulge right lower quadrant, characteristics consistent eventration or laxity but no fascial defect or entrapped intestine. No inguinal or suprapubic hernias noted.  Musculoskeletal: She exhibits no edema and no tenderness.  Lymphadenopathy:    She has no cervical adenopathy.  Neurological: She is alert  and oriented to person, place, and time. She exhibits normal muscle tone. Coordination normal.  Skin: Skin is warm. No rash noted. She is not diaphoretic. No erythema. No pallor.  Psychiatric: She has a normal mood and affect. Her behavior is normal. Judgment and thought content normal.    Data Reviewed Old chart. Dr. Kittie Plater operative note and photographs. Recent CT scan.  Assessment    Recent episode suprapubic abdominal pain of uncertain etiology. Physical exam and CT scan did not show any evidence of hernia or intra-abdominal pathology.  Right lower quadrant bulge. The muscles are extremely thin here and although radiographically this is an eventration, the patient is distressed about the progress of bulge and asymmetry. Because of the large area that would have to be bridged, I think this will need to be repaired as an open technique with inlay mesh. I told her that laparoscopic bridging would lead to breakdown or bowing and continued cosmetic deformity. The that was unacceptable to her.    Plan    She wants to have  surgery to repair this right lower quadrant defect in January or February. I told her this would require a transverse incision and excision of the old scar for revision at her request. I told her that inlay mesh would be required. I told her that she would need to be an inpatient for a couple of days and no sports or lifting for 4-5 weeks.  I discussed the indications, details, techniques and numerous risks of the surgery with her. She understands these issues. Her questions are answered. She agrees with this plan.  She will return to see me in December for repeat physical exam, preop evaluation, and probable scheduling of surgical repair of her hernia.       Angelia Mould. Derrell Lolling, M.D., [facility] General and Minimally invasive Surgery Breast and Colorectal Surgery Office:   [phone] Pager:   [phone]  12/24/2011, 12:54 PM

## 2011-12-24 NOTE — Patient Instructions (Signed)
The bulge in your right lower abdomen is due to progressive thinning of the muscles. This will require an open repair because of the large area that will need to be bridged with mesh. I do not feel a hernia or any disease in the midline.  You have stated that you want to put off surgery until January or February, and I think that is reasonable.  Return to see Dr. Derrell Lolling in December for a preoperative exam and checked.

## 2012-01-03 ENCOUNTER — Ambulatory Visit (INDEPENDENT_AMBULATORY_CARE_PROVIDER_SITE_OTHER): Payer: Self-pay | Admitting: General Surgery

## 2012-02-26 ENCOUNTER — Encounter (INDEPENDENT_AMBULATORY_CARE_PROVIDER_SITE_OTHER): Payer: Self-pay | Admitting: General Surgery

## 2012-11-11 ENCOUNTER — Encounter (HOSPITAL_BASED_OUTPATIENT_CLINIC_OR_DEPARTMENT_OTHER): Payer: Self-pay

## 2012-11-11 ENCOUNTER — Emergency Department (HOSPITAL_BASED_OUTPATIENT_CLINIC_OR_DEPARTMENT_OTHER)
Admission: EM | Admit: 2012-11-11 | Discharge: 2012-11-11 | Disposition: A | Discharge status: Home / Self Care | Payer: BC Managed Care – PPO | Attending: Emergency Medicine | Admitting: Emergency Medicine

## 2012-11-11 DIAGNOSIS — F411 Generalized anxiety disorder: Secondary | ICD-10-CM | POA: Insufficient documentation

## 2012-11-11 DIAGNOSIS — Z8719 Personal history of other diseases of the digestive system: Secondary | ICD-10-CM | POA: Insufficient documentation

## 2012-11-11 DIAGNOSIS — T438X5A Adverse effect of other psychotropic drugs, initial encounter: Secondary | ICD-10-CM | POA: Insufficient documentation

## 2012-11-11 DIAGNOSIS — T7840XA Allergy, unspecified, initial encounter: Secondary | ICD-10-CM | POA: Insufficient documentation

## 2012-11-11 DIAGNOSIS — Z87891 Personal history of nicotine dependence: Secondary | ICD-10-CM | POA: Insufficient documentation

## 2012-11-11 DIAGNOSIS — R5381 Other malaise: Secondary | ICD-10-CM | POA: Insufficient documentation

## 2012-11-11 DIAGNOSIS — Z8669 Personal history of other diseases of the nervous system and sense organs: Secondary | ICD-10-CM | POA: Insufficient documentation

## 2012-11-11 DIAGNOSIS — R531 Weakness: Secondary | ICD-10-CM

## 2012-11-11 DIAGNOSIS — F419 Anxiety disorder, unspecified: Secondary | ICD-10-CM

## 2012-11-11 LAB — BASIC METABOLIC PANEL
Chloride: 100 mEq/L (ref 96–112)
Creatinine, Ser: 1 mg/dL (ref 0.50–1.10)
GFR calc Af Amer: 79 mL/min — ABNORMAL LOW (ref >90)

## 2012-11-11 LAB — CBC WITH DIFFERENTIAL/PLATELET
Basophils Absolute: 0 10*3/uL (ref 0.0–0.1)
Basophils Relative: 0 % (ref 0–1)
MCHC: 33.2 g/dL (ref 30.0–36.0)
Monocytes Absolute: 0.6 10*3/uL (ref 0.1–1.0)
Neutro Abs: 6.4 10*3/uL (ref 1.7–7.7)
Neutrophils Relative %: 73 % (ref 43–77)
RDW: 13.5 % (ref 11.5–15.5)

## 2012-11-11 MED ORDER — LORAZEPAM 1 MG PO TABS
2.0000 mg | ORAL_TABLET | Freq: Once | ORAL | Status: AC
  Administered 2012-11-11: 2 mg via ORAL
  Filled 2012-11-11: qty 2

## 2012-11-11 MED ORDER — LORAZEPAM 1 MG PO TABS: 1.0000 mg | ORAL_TABLET | Freq: Four times a day (QID) | ORAL | Status: DC | PRN

## 2012-11-11 NOTE — ED Notes (Signed)
MD at bedside. 

## 2012-11-11 NOTE — ED Provider Notes (Signed)
History    CSN: [id number] Arrival date & time 11/11/12  4098  First MD Initiated Contact with Patient 11/11/12 1007     Chief Complaint  Patient presents with  . Anxiety  . Allergic Reaction   (Consider location/radiation/quality/duration/timing/severity/associated sxs/prior Treatment) HPI Comments: Patient with history of Migraines, Anxiety presents with complaints of feeling shaky, tremors, "cold legs", dizzy for the past three days.  She denies any chest pain or shortness of breath.  She was started on lithium about three weeks ago and stopped taking it about one week ago because she was having side effects.  She was also taken off of her topamax about two weeks ago.  She is on prozac.  She reports a lot of stress at work.    Patient is a 42 y.o. female presenting with anxiety and allergic reaction. The history is provided by the patient.  Anxiety This is a new problem. Episode onset: 3 days ago. The problem occurs constantly. The problem has not changed since onset.Pertinent negatives include no chest pain and no shortness of breath. Nothing aggravates the symptoms. Nothing relieves the symptoms. She has tried nothing for the symptoms. The treatment provided no relief.  Allergic Reaction  Past Medical History  Diagnosis Date  . Migraines   . Anxiety   . GERD (gastroesophageal reflux disease)    Past Surgical History  Procedure Laterality Date  . Mini tuck  2012  . Cesarean section  2007/2010  . Right ear surg  03/2008    cancerous lesion removed  . Wisdom tooth extraction    . Laparoscopic tubal ligation  06/12/2011    Procedure: LAPAROSCOPIC TUBAL LIGATION;  Surgeon: Loney Laurence, MD;  Location: [facility];  Service: Gynecology;  Laterality: N/A;  filshie clip  . US echocardiography  12-30-2007    EF 55-60%   Family History  Problem Relation Age of Onset  . Lymphoma Father   . Cancer Father     lymphoma  . Diabetes Brother    History  Substance Use Topics  .  Smoking status: Former Smoker -- 0.25 packs/day for 6 years    Types: Cigarettes    Quit date: 03/29/2005  . Smokeless tobacco: Never Used  . Alcohol Use: Yes     Comment: socially   OB History   Grav Para Term Preterm Abortions TAB SAB Ect Mult Living                 Review of Systems  Respiratory: Negative for shortness of breath.   Cardiovascular: Negative for chest pain.  All other systems reviewed and are negative.    Allergies  Review of patient's allergies indicates no known allergies.  Home Medications   Current Outpatient Rx  Name  Route  Sig  Dispense  Refill  . FLUoxetine (PROZAC) 20 MG capsule   Oral   Take 20 mg by mouth daily.         Marland Kitchen lithium carbonate 150 MG capsule   Oral   Take 150 mg by mouth 2 (two) times daily with a meal.         . ibuprofen (ADVIL,MOTRIN) 200 MG tablet   Oral   Take 800 mg by mouth daily as needed. For pain          BP 138/93  Pulse 90  Temp(Src) 98.6 F (37 C) (Oral)  Resp 18  Ht 5\' 10"  (1.778 m)  Wt 183 lb (83.008 kg)  BMI 26.26 kg/m2  SpO2  100%  LMP 10/28/2012 Physical Exam  Nursing note and vitals reviewed. Constitutional: She is oriented to person, place, and time. She appears well-developed and well-nourished. No distress.  She appears anxious and tremulous.  HENT:  Head: Normocephalic and atraumatic.  Eyes: EOM are normal. Pupils are equal, round, and reactive to light.  Neck: Normal range of motion. Neck supple.  Cardiovascular: Normal rate and regular rhythm.  Exam reveals no gallop and no friction rub.   No murmur heard. Pulmonary/Chest: Effort normal and breath sounds normal. No respiratory distress. She has no wheezes.  Abdominal: Soft. Bowel sounds are normal. She exhibits no distension. There is no tenderness.  Musculoskeletal: Normal range of motion.  Neurological: She is alert and oriented to person, place, and time. No cranial nerve deficit. She exhibits normal muscle tone. Coordination  normal.  There is tremor noted.  Skin: Skin is warm and dry. She is not diaphoretic.    ED Course  Procedures (including critical care time) Labs Reviewed  CBC WITH DIFFERENTIAL  BASIC METABOLIC PANEL   No results found. No diagnosis found.   Date: 11/11/2012  Rate: 68  Rhythm: normal sinus rhythm  QRS Axis: normal  Intervals: normal  ST/T Wave abnormalities: normal  Conduction Disutrbances:nonspecific intraventricular conduction delay  Narrative Interpretation:   Old EKG Reviewed: changes noted    MDM  The patient presents with complaints of anxiety, shakiness that I believe is related to recent changes in her medications.  The workup shows normal labs and ekg shows non-specific ivcd that is new.  I am uncertain the significance of this, but I doubt any emergent pathology.  She is feeling better with ativan and I will discharge her to home.  To return prn.  Geoffery Lyons, MD 11/11/12 [phone]

## 2012-11-11 NOTE — ED Notes (Addendum)
Pt reports she has bilateral upper an lower extremity "numbness and tingling" associated with temperature changes in legs.  She was recently started on Lithium approximately 1 week ago and feels this is an allergic reaction to the medication.  She appears extremely anxious and reports her muscles are twitching and she has only slept 5 hours the past 3 days.

## 2013-02-01 ENCOUNTER — Other Ambulatory Visit: Payer: Self-pay

## 2013-02-01 DIAGNOSIS — Z1231 Encounter for screening mammogram for malignant neoplasm of breast: Secondary | ICD-10-CM

## 2013-03-09 ENCOUNTER — Ambulatory Visit: Payer: BC Managed Care – PPO

## 2013-04-14 ENCOUNTER — Emergency Department (HOSPITAL_BASED_OUTPATIENT_CLINIC_OR_DEPARTMENT_OTHER)
Admission: EM | Admit: 2013-04-14 | Discharge: 2013-04-14 | Disposition: A | Discharge status: Home / Self Care | Payer: BC Managed Care – PPO | Attending: Emergency Medicine | Admitting: Emergency Medicine

## 2013-04-14 ENCOUNTER — Encounter (HOSPITAL_BASED_OUTPATIENT_CLINIC_OR_DEPARTMENT_OTHER): Payer: Self-pay | Admitting: Emergency Medicine

## 2013-04-14 DIAGNOSIS — G43909 Migraine, unspecified, not intractable, without status migrainosus: Secondary | ICD-10-CM | POA: Insufficient documentation

## 2013-04-14 DIAGNOSIS — F411 Generalized anxiety disorder: Secondary | ICD-10-CM | POA: Insufficient documentation

## 2013-04-14 DIAGNOSIS — Z87891 Personal history of nicotine dependence: Secondary | ICD-10-CM | POA: Insufficient documentation

## 2013-04-14 DIAGNOSIS — R5381 Other malaise: Secondary | ICD-10-CM | POA: Insufficient documentation

## 2013-04-14 DIAGNOSIS — Z79899 Other long term (current) drug therapy: Secondary | ICD-10-CM | POA: Insufficient documentation

## 2013-04-14 DIAGNOSIS — J029 Acute pharyngitis, unspecified: Secondary | ICD-10-CM

## 2013-04-14 DIAGNOSIS — K219 Gastro-esophageal reflux disease without esophagitis: Secondary | ICD-10-CM | POA: Insufficient documentation

## 2013-04-14 LAB — RAPID STREP SCREEN (MED CTR MEBANE ONLY): Streptococcus, Group A Screen (Direct): NEGATIVE

## 2013-04-14 NOTE — ED Provider Notes (Signed)
CSN: [id number]     Arrival date & time 04/14/13  0935 History   First MD Initiated Contact with Patient 04/14/13 1017     Chief Complaint  Patient presents with  . Sore Throat    Patient is a 42 y.o. female presenting with pharyngitis. The history is provided by the patient.  Sore Throat This is a new problem. The current episode started more than 2 days ago. The problem occurs constantly. The problem has not changed since onset.Pertinent negatives include no chest pain, no abdominal pain, no headaches and no shortness of breath. The symptoms are aggravated by swallowing. Nothing relieves the symptoms. Treatments tried: nyquil. The treatment provided no relief.    Past Medical History  Diagnosis Date  . Migraines   . Anxiety   . GERD (gastroesophageal reflux disease)    Past Surgical History  Procedure Laterality Date  . Mini tuck  2012  . Cesarean section  2007/2010  . Right ear surg  03/2008    cancerous lesion removed  . Wisdom tooth extraction    . Laparoscopic tubal ligation  06/12/2011    Procedure: LAPAROSCOPIC TUBAL LIGATION;  Surgeon: Loney Laurence, MD;  Location: [facility];  Service: Gynecology;  Laterality: N/A;  filshie clip  . US echocardiography  12-30-2007    EF 55-60%   Family History  Problem Relation Age of Onset  . Lymphoma Father   . Cancer Father     lymphoma  . Diabetes Brother    History  Substance Use Topics  . Smoking status: Former Smoker -- 0.25 packs/day for 6 years    Types: Cigarettes    Quit date: 03/29/2005  . Smokeless tobacco: Never Used  . Alcohol Use: Yes     Comment: socially   OB History   Grav Para Term Preterm Abortions TAB SAB Ect Mult Living                 Review of Systems  Respiratory: Positive for cough. Negative for shortness of breath.   Cardiovascular: Negative for chest pain.  Gastrointestinal: Negative for abdominal pain.  Musculoskeletal: Positive for myalgias.  Neurological: Negative for headaches.  All  other systems reviewed and are negative.    Allergies  Review of patient's allergies indicates no known allergies.  Home Medications   Current Outpatient Rx  Name  Route  Sig  Dispense  Refill  . esomeprazole (NEXIUM) 40 MG capsule   Oral   Take 40 mg by mouth daily at 12 noon.         Marland Kitchen LORazepam (ATIVAN) 1 MG tablet   Oral   Take 1 tablet (1 mg total) by mouth every 6 (six) hours as needed for anxiety.   12 tablet   0   . topiramate (TOPAMAX) 100 MG tablet   Oral   Take 100 mg by mouth daily.         Marland Kitchen FLUoxetine (PROZAC) 20 MG capsule   Oral   Take 20 mg by mouth daily.         Marland Kitchen ibuprofen (ADVIL,MOTRIN) 200 MG tablet   Oral   Take 800 mg by mouth daily as needed. For pain         . lithium carbonate 150 MG capsule   Oral   Take 150 mg by mouth 2 (two) times daily with a meal.          BP 131/86  Temp(Src) 98.2 F (36.8 C) (Oral)  Resp 20  Ht 5\' 10"  (1.778 m)  Wt 195 lb (88.451 kg)  BMI 27.98 kg/m2  SpO2 98%  LMP 04/08/2013 Physical Exam  Nursing note and vitals reviewed. Constitutional: She appears well-developed and well-nourished. No distress.  HENT:  Head: Normocephalic and atraumatic.  Right Ear: Tympanic membrane and external ear normal.  Left Ear: Tympanic membrane and external ear normal.  Mouth/Throat: Mucous membranes are normal. No trismus in the jaw. Posterior oropharyngeal erythema present. No oropharyngeal exudate, posterior oropharyngeal edema or tonsillar abscesses.  Eyes: Conjunctivae are normal. Right eye exhibits no discharge. Left eye exhibits no discharge. No scleral icterus.  Neck: Neck supple. No tracheal deviation present.  Cardiovascular: Normal rate, regular rhythm and intact distal pulses.   Pulmonary/Chest: Effort normal and breath sounds normal. No stridor. No respiratory distress. She has no wheezes. She has no rales.  Abdominal: Soft. Bowel sounds are normal. She exhibits no distension. There is no tenderness.  There is no rebound and no guarding.  Musculoskeletal: She exhibits no edema and no tenderness.  Neurological: She is alert. She has normal strength. No sensory deficit. Cranial nerve deficit:  no gross defecits noted. She exhibits normal muscle tone. She displays no seizure activity. Coordination normal.  Skin: Skin is warm and dry. No rash noted.  Psychiatric: She has a normal mood and affect.    ED Course  Procedures (including critical care time) Labs Review Labs Reviewed  RAPID STREP SCREEN   Imaging Review No results found.  EKG Interpretation   None       MDM   1. Pharyngitis    Likely viral illness.  Discussed otc meds, supportive care   Celene Kras, MD 04/14/13 1106

## 2013-04-14 NOTE — ED Notes (Signed)
Sore throat for the last several days.  States last night the sore throat worsened and developed generalized body aches and chills.

## 2013-04-16 LAB — CULTURE, GROUP A STREP

## 2013-04-17 ENCOUNTER — Telehealth (HOSPITAL_BASED_OUTPATIENT_CLINIC_OR_DEPARTMENT_OTHER): Payer: Self-pay | Admitting: Emergency Medicine

## 2013-04-17 NOTE — Progress Notes (Signed)
ED Antimicrobial Stewardship Positive Culture Follow Up   Rebecca [name] is an 42 y.o. female who presented to [facility] on 04/14/2013 with a chief complaint of  Chief Complaint  Patient presents with  . Sore Throat    Recent Results (from the past 720 hour(s))  RAPID STREP SCREEN     Status: None   Collection Time    04/14/13 10:30 AM      Result Value Range Status   Streptococcus, Group A Screen (Direct) NEGATIVE  NEGATIVE Final   Comment: (NOTE)     A Rapid Antigen test may result negative if the antigen level in the     sample is below the detection level of this test. The FDA has not     cleared this test as a stand-alone test therefore the rapid antigen     negative result has reflexed to a Group A Strep culture.  CULTURE, GROUP A STREP     Status: None   Collection Time    04/14/13 10:30 AM      Result Value Range Status   Specimen Description THROAT   Final   Special Requests NONE   Final   Culture     Final   Value: GROUP A STREP (S.PYOGENES) ISOLATED     Performed at [facility]   Report Status 04/16/2013 FINAL   Final     [x]  Patient discharged originally without antimicrobial agent and treatment is now indicated  New antibiotic prescription: amoxicillin 500mg  BID x 10 days  ED Provider: Coral Ceo, PA-C   Mickeal Skinner 04/17/2013, 11:39 AM Infectious Diseases Pharmacist Phone# [phone]

## 2013-04-18 NOTE — ED Notes (Signed)
Post ED Visit - Positive Culture Follow-up: Successful Patient Follow-Up  Culture assessed and recommendations reviewed by: []  Wes Dulaney, Pharm.D., BCPS [x]  Celedonio Miyamoto, Pharm.D., BCPS []  Georgina Pillion, Pharm.D., BCPS []  [place], [address].D., BCPS, AAHIVP []  Estella Husk, Pharm.D., BCPS, AAHIVP  Positive strep culture  [x]  Patient discharged without antimicrobial prescription and treatment is now indicated []  Organism is resistant to prescribed ED discharge antimicrobial []  Patient with positive blood cultures  Changes discussed with ED provider: Coral Ceo PA-C New antibiotic prescription: Amoxicillin 500 mg BID x 10 days    Kylie A Holland 04/18/2013, 10:01 AM

## 2013-04-19 NOTE — ED Notes (Signed)
Unable to contact via phone.'Letter sent to [facility] address. 

## 2013-05-17 ENCOUNTER — Other Ambulatory Visit: Payer: Self-pay | Admitting: Internal Medicine

## 2013-05-17 DIAGNOSIS — R1013 Epigastric pain: Secondary | ICD-10-CM

## 2013-05-20 ENCOUNTER — Ambulatory Visit
Admission: RE | Admit: 2013-05-20 | Discharge: 2013-05-20 | Disposition: A | Discharge status: Home / Self Care | Payer: BC Managed Care – PPO | Source: Ambulatory Visit | Attending: Internal Medicine | Admitting: Internal Medicine

## 2013-05-20 DIAGNOSIS — R1013 Epigastric pain: Secondary | ICD-10-CM

## 2013-05-20 IMAGING — US US ABDOMEN COMPLETE
1 series · 14 of 25 positions shown · non-contrast
Comparison: CT abdomen pelvis dated [DATE]

CLINICAL DATA: Epigastric pain

EXAM:
ULTRASOUND ABDOMEN COMPLETE

[Series 1: us abdomen complete · 0.22mm/px · 14 of 78 slices shown]
[im 1/78]
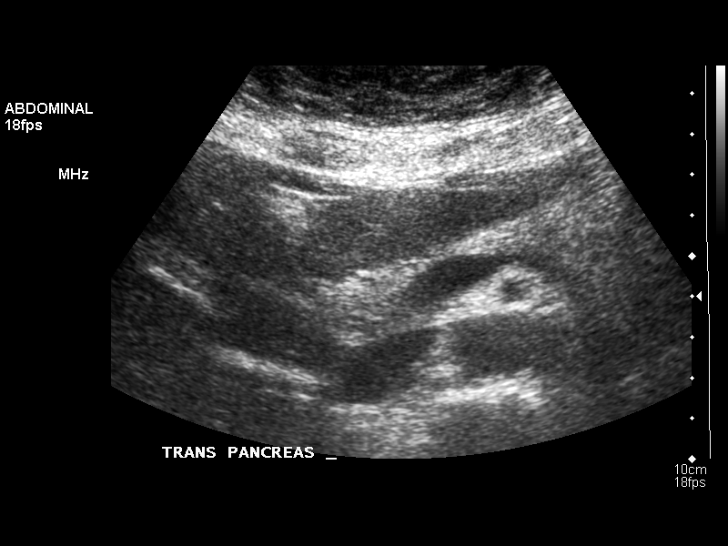
[im 7/78]
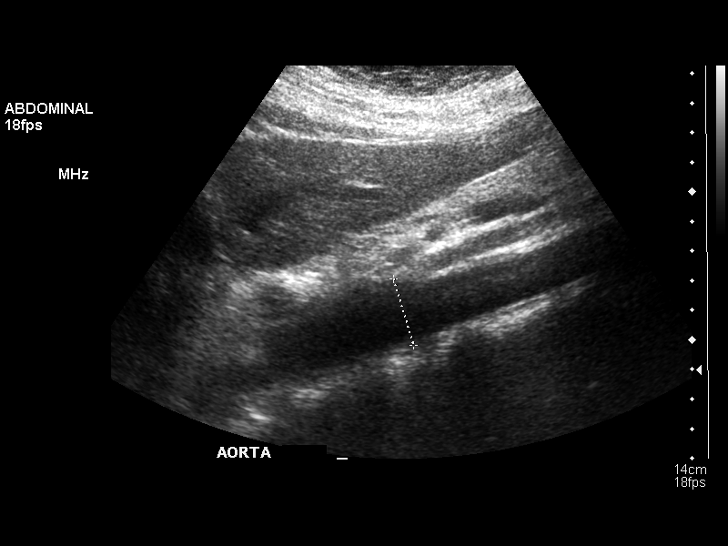
[im 13/78]
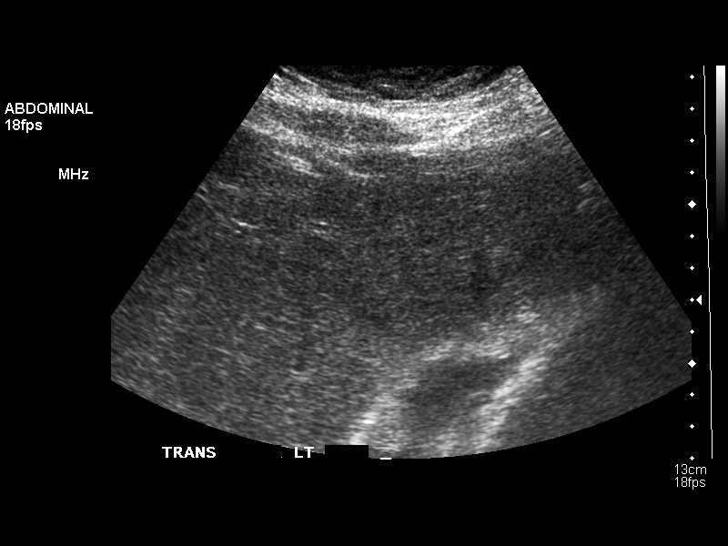
[im 20/78]
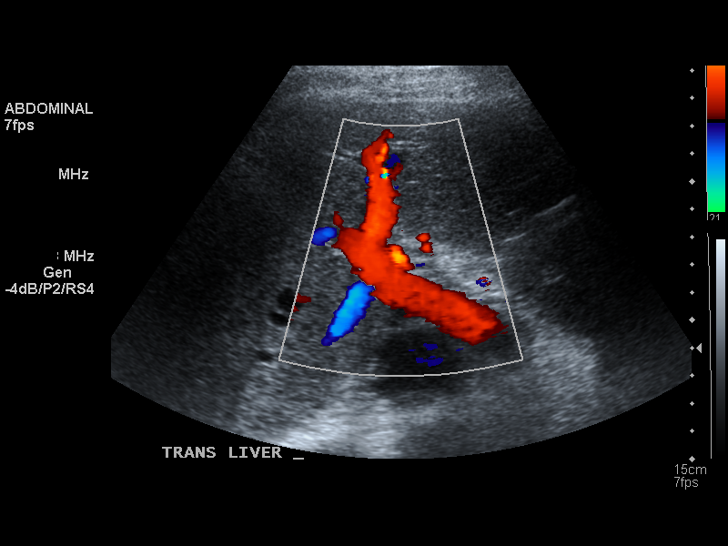
[im 26/78]
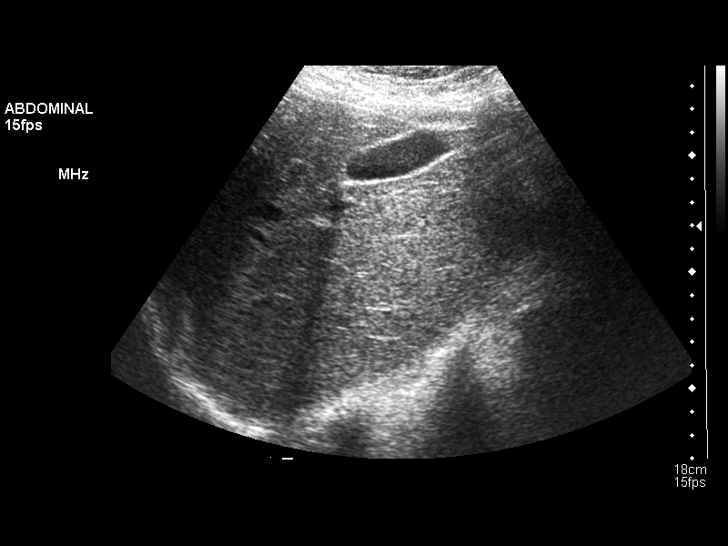
[im 29/78]
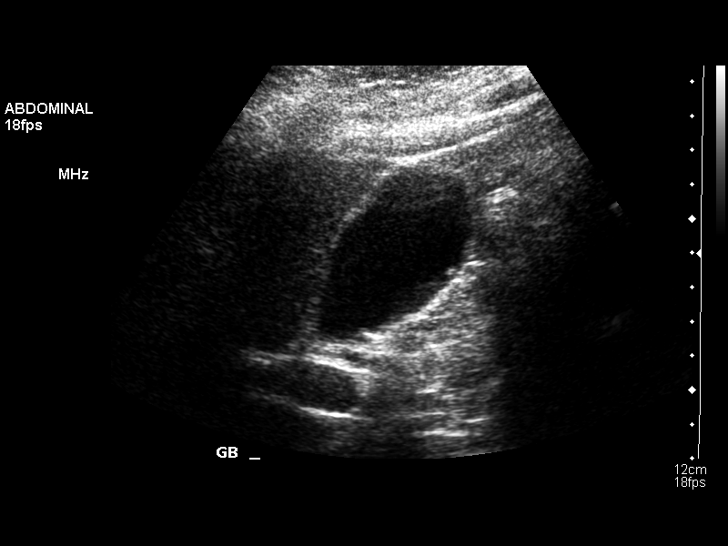
[im 36/78]
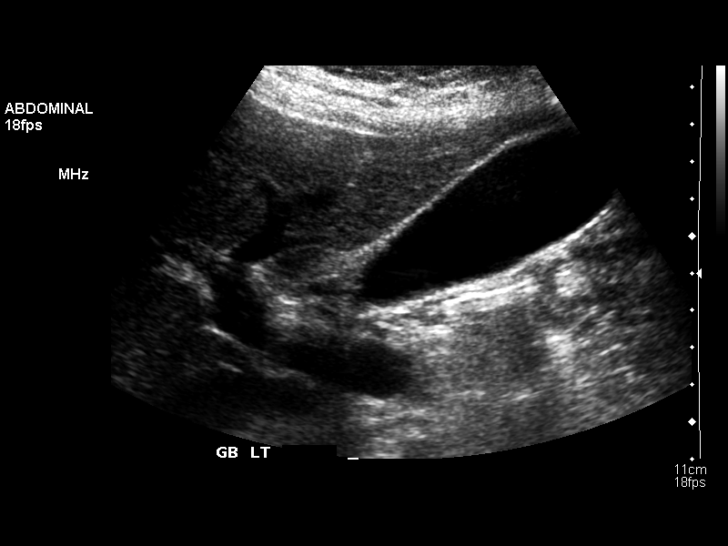
[im 42/78]
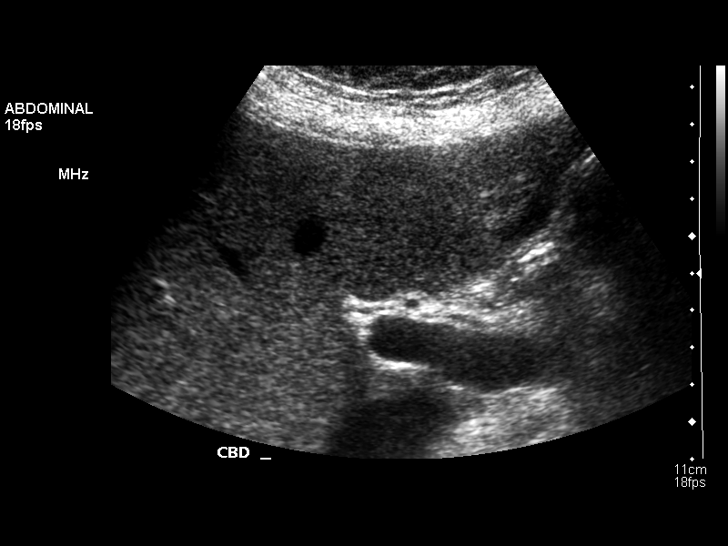
[im 49/78]
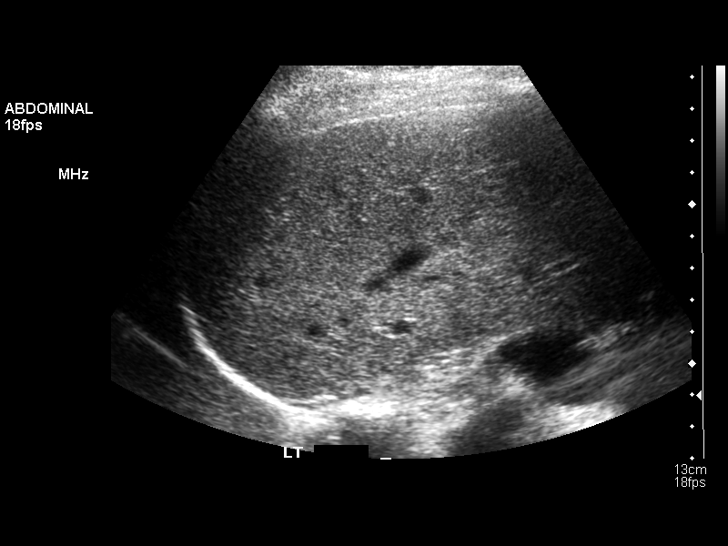
[im 52/78]
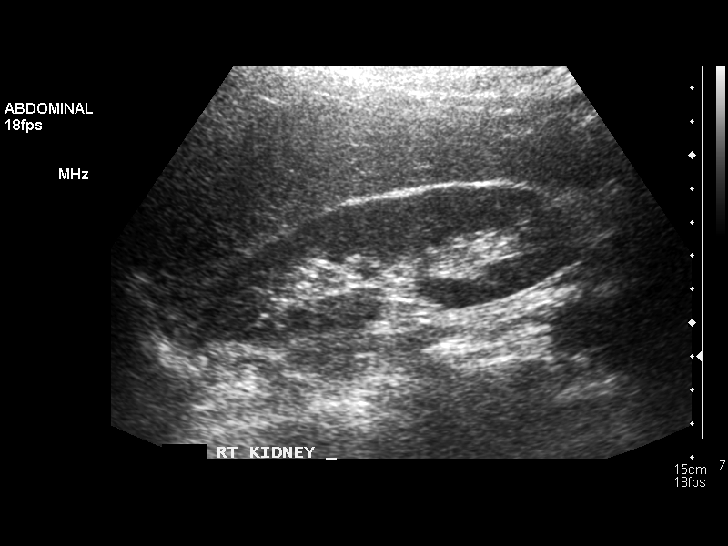
[im 58/78]
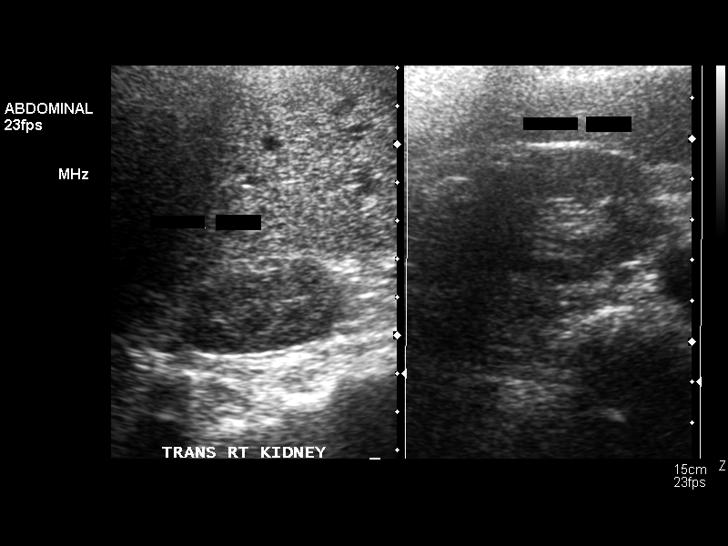
[im 65/78]
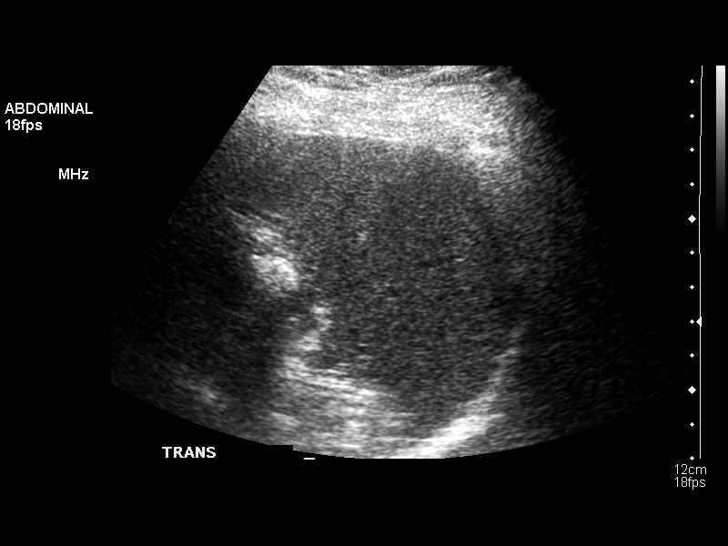
[im 71/78]
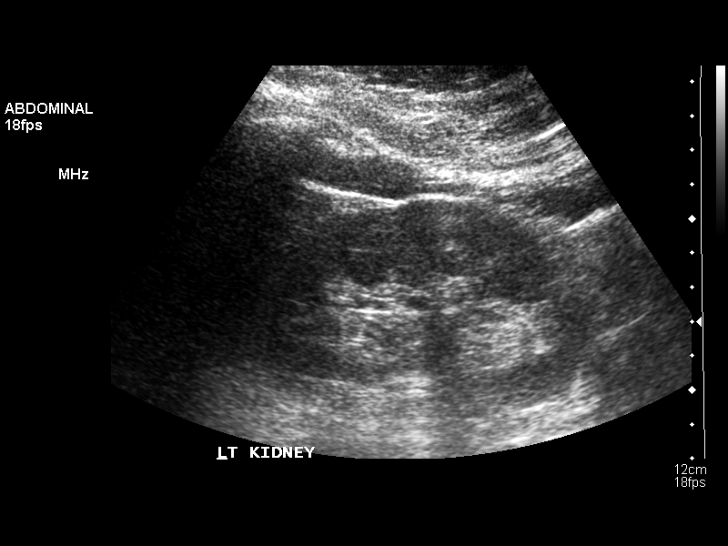
[im 78/78]
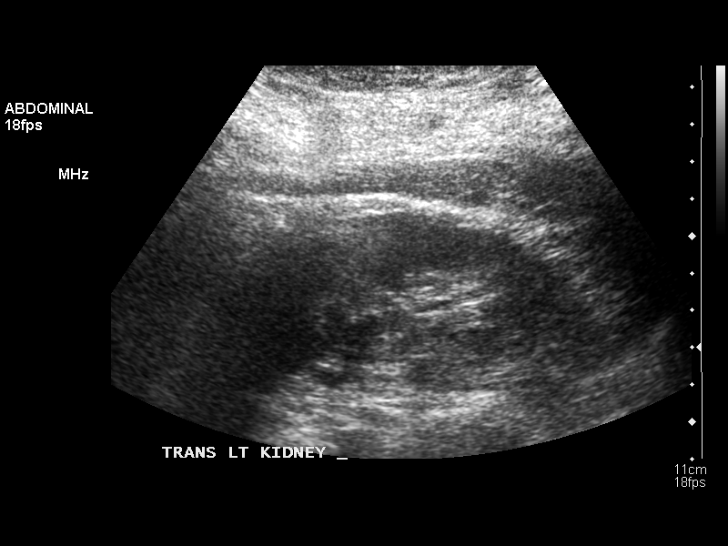

[14 of 25 positions shown; findings below may reference images not displayed]

FINDINGS: Gallbladder:

3 mm polyp versus adherent/nonshadowing gallstone (image 29). No
gallbladder wall thickening or pericholecystic fluid. Negative
sonographic Murphy's sign.

Common bile duct:

Diameter: 3 mm.

Liver:

No focal lesion identified. Within normal limits in parenchymal
echogenicity.

IVC:

No abnormality visualized.

Pancreas:

Visualized portions are unremarkable.

Spleen:

Measures 8.1 cm.

Right Kidney:

Length: 11.8 cm.  No mass or hydronephrosis.

Left Kidney:

Length: 11.5 cm.  No mass or hydronephrosis.

Abdominal aorta:

No aneurysm visualized.

Other findings:

None.
IMPRESSION: 3 mm gallbladder polyp (versus adherent/nonshadowing gallstone).

Otherwise negative abdominal ultrasound.

## 2013-08-03 ENCOUNTER — Other Ambulatory Visit: Payer: Self-pay | Admitting: Obstetrics and Gynecology

## 2013-08-12 ENCOUNTER — Ambulatory Visit
Admission: RE | Admit: 2013-08-12 | Discharge: 2013-08-12 | Disposition: A | Discharge status: Home / Self Care | Payer: BC Managed Care – PPO | Source: Ambulatory Visit

## 2013-08-12 DIAGNOSIS — Z1231 Encounter for screening mammogram for malignant neoplasm of breast: Secondary | ICD-10-CM

## 2014-01-07 ENCOUNTER — Other Ambulatory Visit (INDEPENDENT_AMBULATORY_CARE_PROVIDER_SITE_OTHER): Payer: Self-pay | Admitting: General Surgery

## 2014-01-07 DIAGNOSIS — K436 Other and unspecified ventral hernia with obstruction, without gangrene: Secondary | ICD-10-CM

## 2014-01-13 ENCOUNTER — Ambulatory Visit
Admission: RE | Admit: 2014-01-13 | Discharge: 2014-01-13 | Disposition: A | Discharge status: Home / Self Care | Payer: BC Managed Care – PPO | Source: Ambulatory Visit | Attending: General Surgery | Admitting: General Surgery

## 2014-01-13 DIAGNOSIS — K436 Other and unspecified ventral hernia with obstruction, without gangrene: Secondary | ICD-10-CM

## 2014-01-13 IMAGING — CT CT ABD-PELV W/ CM
3 of 5 series · 12 of 36 positions shown, 18 images · IV contrast (READICAT/WATER & [ID] OMNI 300)
Comparison: Prior CT abdomen/pelvis [DATE]

CLINICAL DATA: History of a ventral abdominal hernia. Valsalva
maneuver performed by patient during the scan.

EXAM:
CT ABDOMEN AND PELVIS WITH CONTRAST
TECHNIQUE: Multidetector CT imaging of the abdomen and pelvis was performed
using the standard protocol following bolus administration of
intravenous contrast.
CONTRAST:  100mL OMNIPAQUE IOHEXOL 300 MG/ML  SOLN

[Series 3: abd/pelvis with · axial · 0.70mm/px · z∈[-372,-52]mm · 8 of 83 slices shown, 13 images]
[im 10/83  soft-tissue]
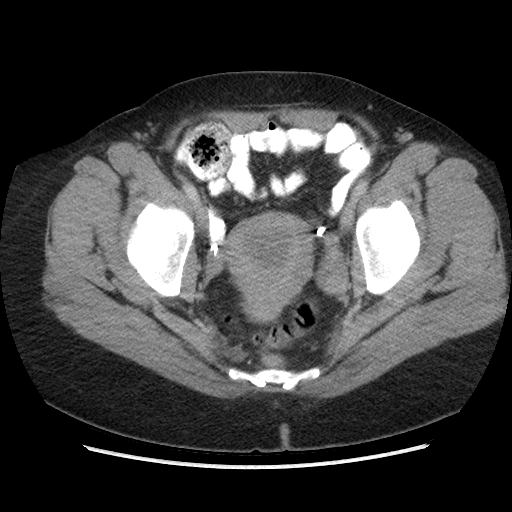
[im 10/83  bone]
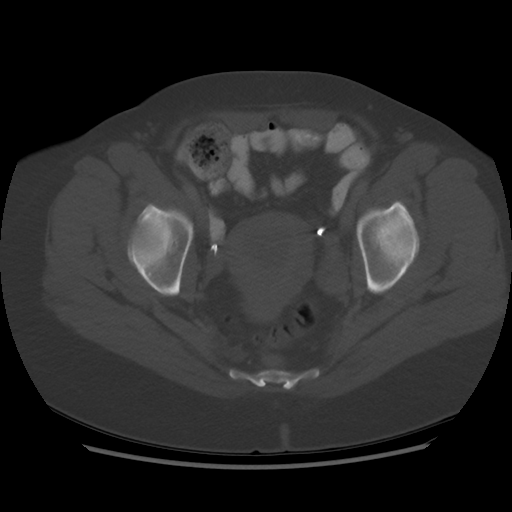
[im 19/83  soft-tissue]
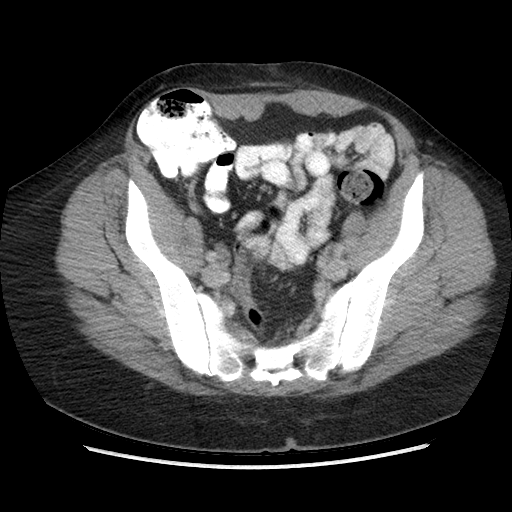
[im 28/83  soft-tissue]
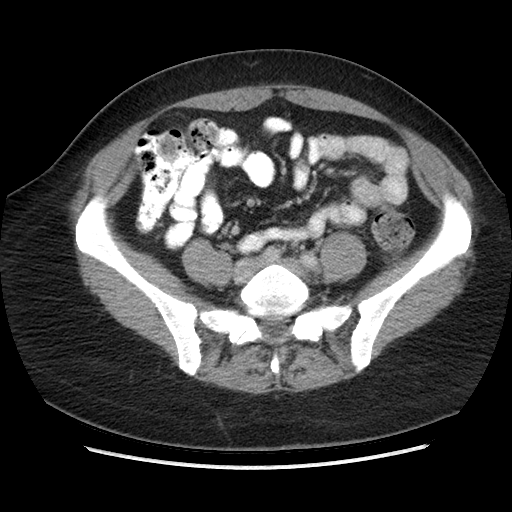
[im 37/83  soft-tissue]
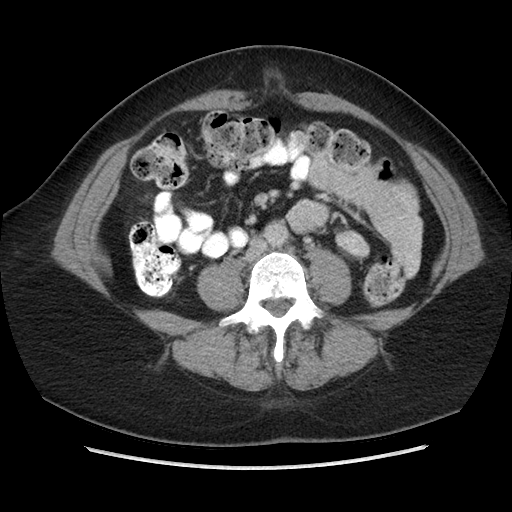
[im 46/83  soft-tissue]
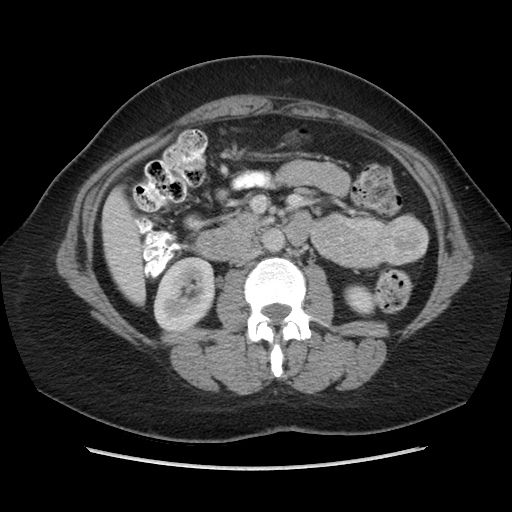
[im 46/83  lung]
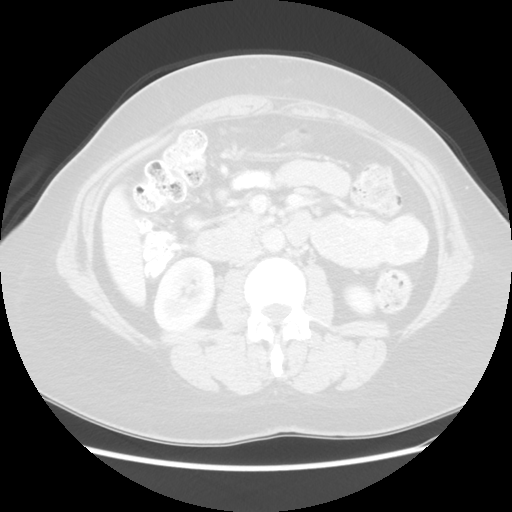
[im 55/83  soft-tissue]
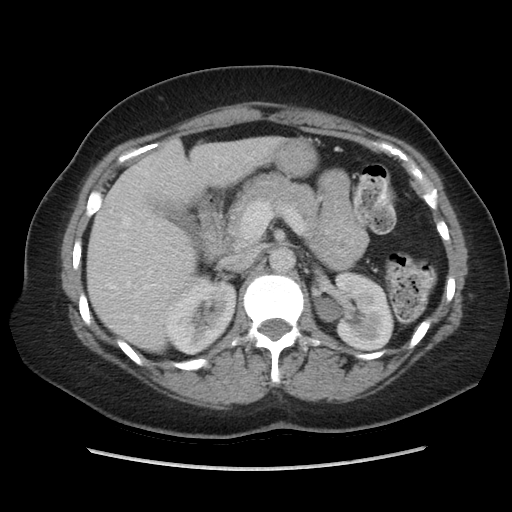
[im 55/83  lung]
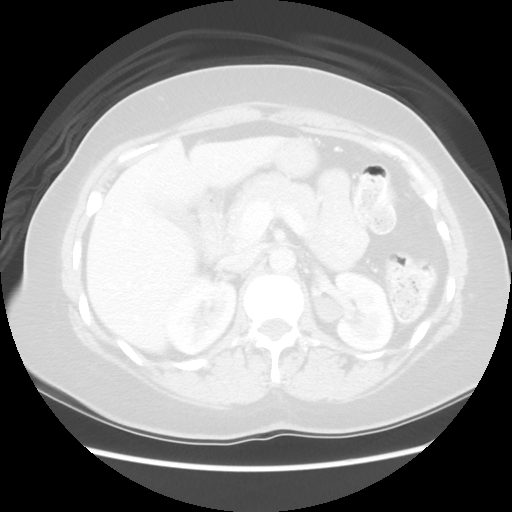
[im 64/83  soft-tissue]
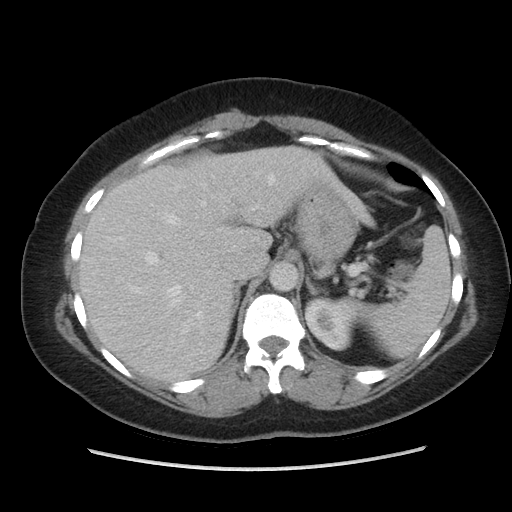
[im 64/83  lung]
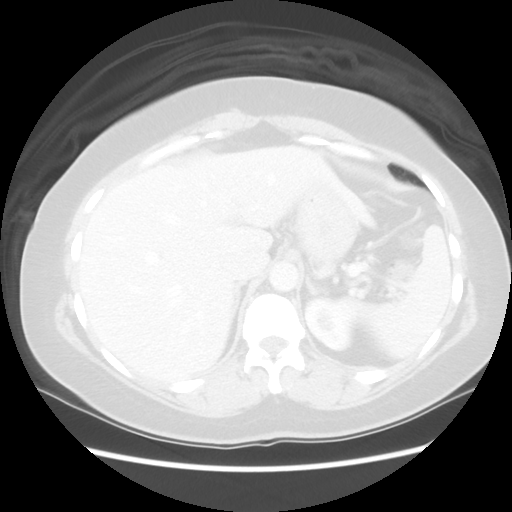
[im 73/83  soft-tissue]
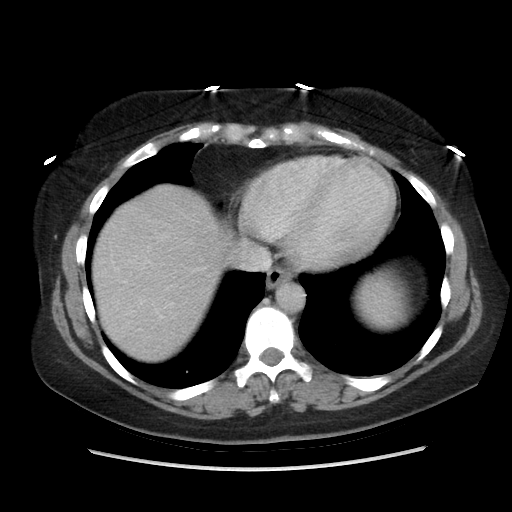
[im 73/83  lung]
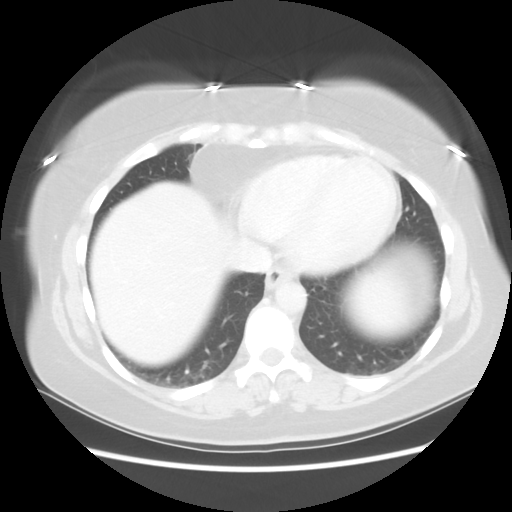

[Series 601: coronal body · coronal · 0.87mm/px · 1 of 121 slices shown, 2 images]
[im 41/121  soft-tissue]
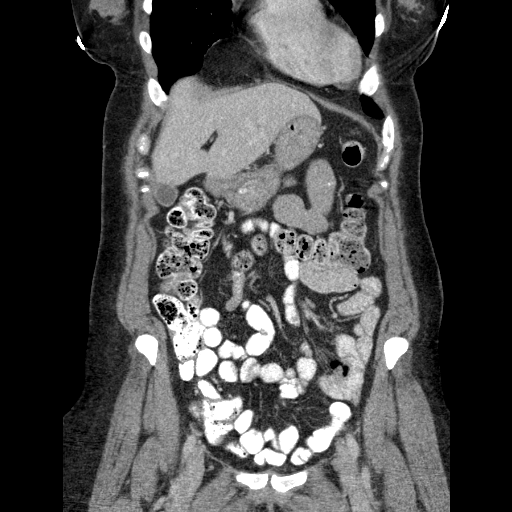
[im 41/121  bone]
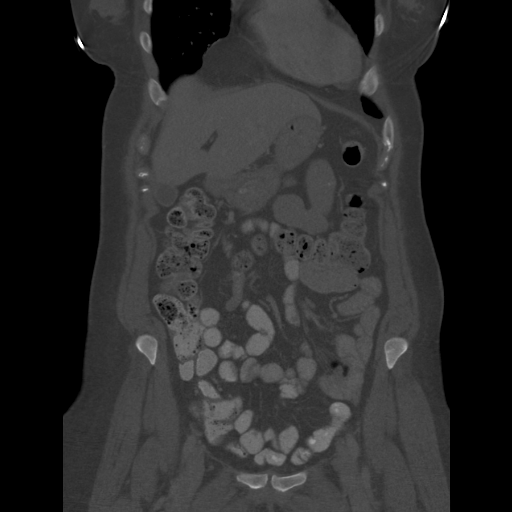

[Series 602: sagittal body · sagittal · 0.87mm/px · 3 of 145 slices shown]
[im 10/145  soft-tissue]
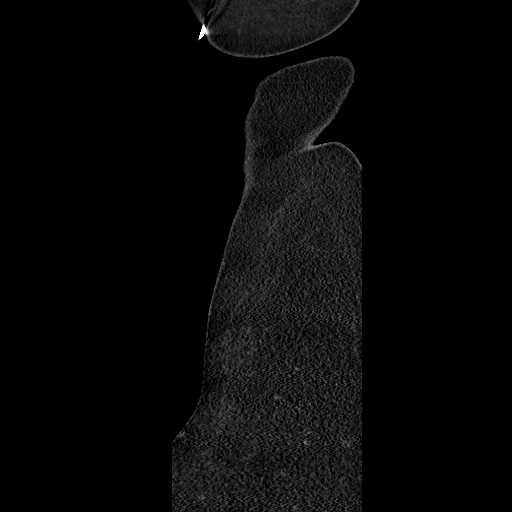
[im 29/145  soft-tissue]
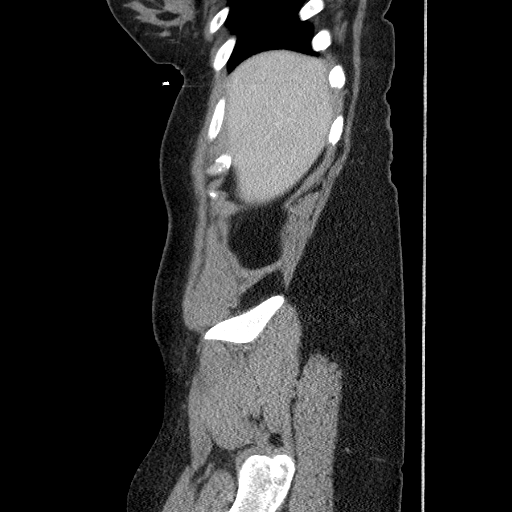
[im 49/145  soft-tissue]
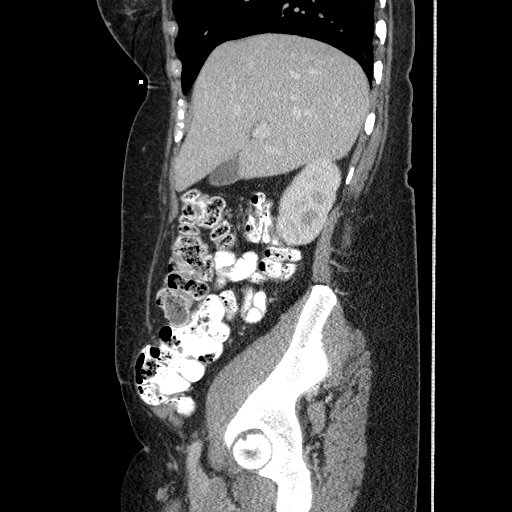

[12 of 36 positions shown; findings below may reference images not displayed]

FINDINGS: Lower Chest: Trace dependent atelectasis. The lung bases are
otherwise clear. Visualized cardiac structures are within normal
limits for size. No pericardial effusion. Small hiatal hernia.

Abdomen: Unremarkable CT appearance of the stomach, duodenum,
spleen, adrenal glands and pancreas. Normal hepatic contour and
morphology. No discrete hepatic lesion. Gallbladder is unremarkable.
No intra or extrahepatic biliary ductal dilatation. Unremarkable
appearance of the bilateral kidneys. No focal solid lesion,
hydronephrosis or nephrolithiasis.

No evidence of obstruction or focal bowel wall thickening. Normal
appendix in the right lower quadrant. The terminal ileum is
unremarkable. No free fluid or suspicious adenopathy.

Pelvis: Unremarkable bladder, uterus and adnexa. Trace free fluid is
likely physiologic. No suspicious adenopathy. Surgical changes of
JIM lateral tubal ligation.

Bones/Soft Tissues: Focal laxity of the right spigelian fascia with
protrusion of the underlying sigmoid colon into the diastatic defect
without true herniation. There may be minimal disruption of this
fascia at the superior extent of the focal diastases. No associated
wall thickening of the involved cecum or evidence of surrounding
inflammatory change. No acute fracture or aggressive appearing lytic
or blastic osseous lesion. Left L4-L5 facet arthropathy.

Vascular: No significant atherosclerotic vascular disease,
aneurysmal dilatation or acute abnormality.
IMPRESSION: 1. Focal laxity of the right spigelian fascia with protrusion of the
underlying sigmoid colon into the diastatic defect without true
herniation. Difficult to exclude minimal disruption of this fascia
at the superior extent of the focal diastases. No evidence of
associated colonic wall thickening, surrounding inflammatory change
or other complicating feature.
2. Small hiatal hernia.
3. Left L4-L5 facet arthropathy.

## 2014-01-13 MED ORDER — IOHEXOL 300 MG/ML  SOLN
100.0000 mL | Freq: Once | INTRAMUSCULAR | Status: AC | PRN
  Administered 2014-01-13: 100 mL via INTRAVENOUS

## 2014-01-18 ENCOUNTER — Telehealth (INDEPENDENT_AMBULATORY_CARE_PROVIDER_SITE_OTHER): Payer: Self-pay | Admitting: General Surgery

## 2014-01-18 NOTE — Telephone Encounter (Signed)
Discussed CT scan findings with patient. Advised followup appt.   Rebecca [name]. Dalbert Batman, M.D., [facility] General and Minimally invasive Surgery Breast and Colorectal Surgery Office:   [phone] Pager:   [phone]

## 2014-10-07 ENCOUNTER — Encounter (HOSPITAL_BASED_OUTPATIENT_CLINIC_OR_DEPARTMENT_OTHER): Payer: Self-pay | Admitting: *Deleted

## 2014-10-07 ENCOUNTER — Emergency Department (HOSPITAL_BASED_OUTPATIENT_CLINIC_OR_DEPARTMENT_OTHER)
Admission: EM | Admit: 2014-10-07 | Discharge: 2014-10-07 | Disposition: A | Discharge status: Home / Self Care | Payer: BLUE CROSS/BLUE SHIELD | Attending: Emergency Medicine | Admitting: Emergency Medicine

## 2014-10-07 ENCOUNTER — Emergency Department (HOSPITAL_BASED_OUTPATIENT_CLINIC_OR_DEPARTMENT_OTHER): Payer: BLUE CROSS/BLUE SHIELD

## 2014-10-07 DIAGNOSIS — F419 Anxiety disorder, unspecified: Secondary | ICD-10-CM | POA: Insufficient documentation

## 2014-10-07 DIAGNOSIS — J159 Unspecified bacterial pneumonia: Secondary | ICD-10-CM | POA: Diagnosis not present

## 2014-10-07 DIAGNOSIS — Z79899 Other long term (current) drug therapy: Secondary | ICD-10-CM | POA: Insufficient documentation

## 2014-10-07 DIAGNOSIS — J189 Pneumonia, unspecified organism: Secondary | ICD-10-CM

## 2014-10-07 DIAGNOSIS — R05 Cough: Secondary | ICD-10-CM | POA: Diagnosis present

## 2014-10-07 DIAGNOSIS — Z87891 Personal history of nicotine dependence: Secondary | ICD-10-CM | POA: Diagnosis not present

## 2014-10-07 DIAGNOSIS — K219 Gastro-esophageal reflux disease without esophagitis: Secondary | ICD-10-CM | POA: Insufficient documentation

## 2014-10-07 DIAGNOSIS — G43909 Migraine, unspecified, not intractable, without status migrainosus: Secondary | ICD-10-CM | POA: Diagnosis not present

## 2014-10-07 IMAGING — DX DG CHEST 2V
2 series · 2 of 2 positions shown · non-contrast
Comparison: [DATE]

CLINICAL DATA: Progressively worsening cough.

EXAM:
CHEST  2 VIEW

[chest pa]
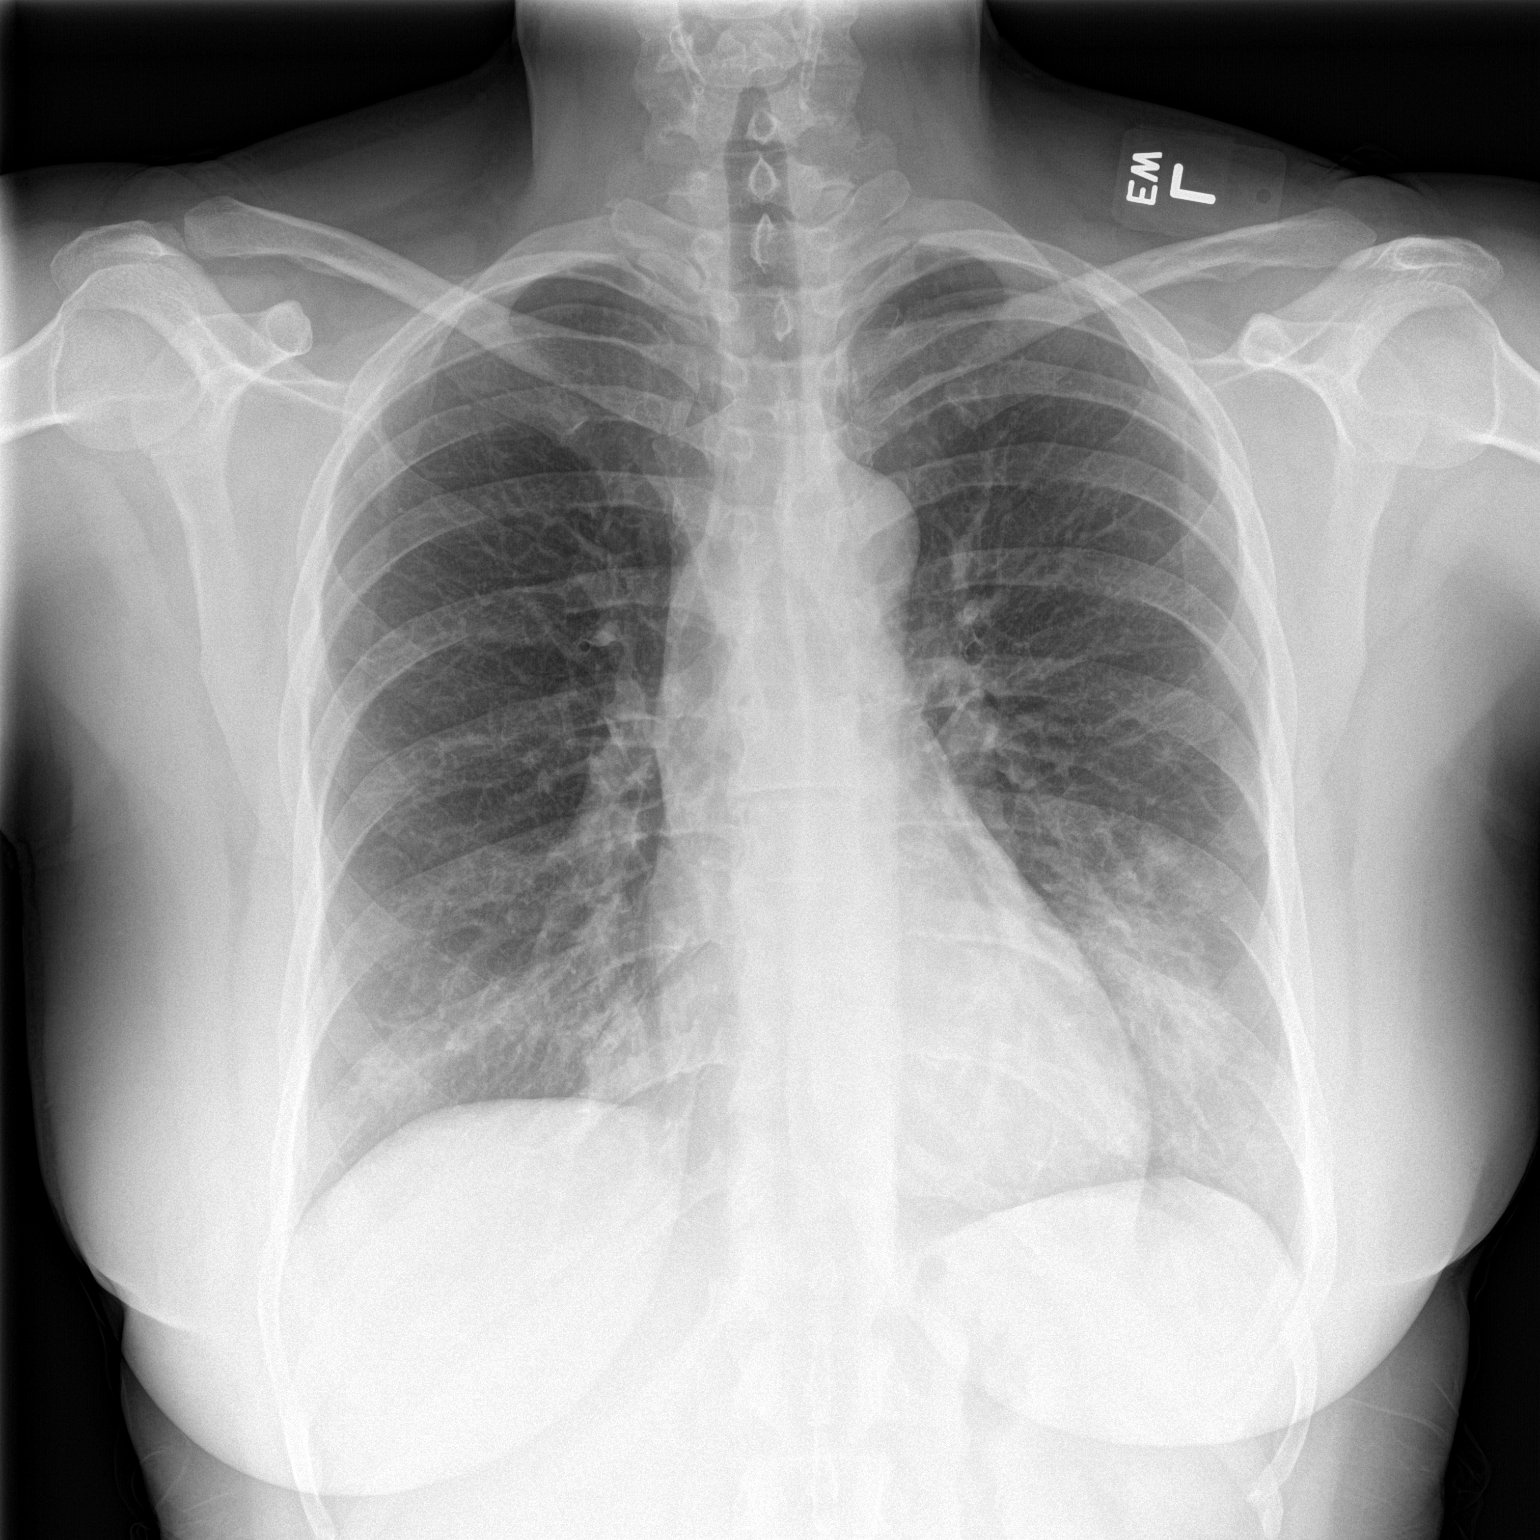

[chest lat]
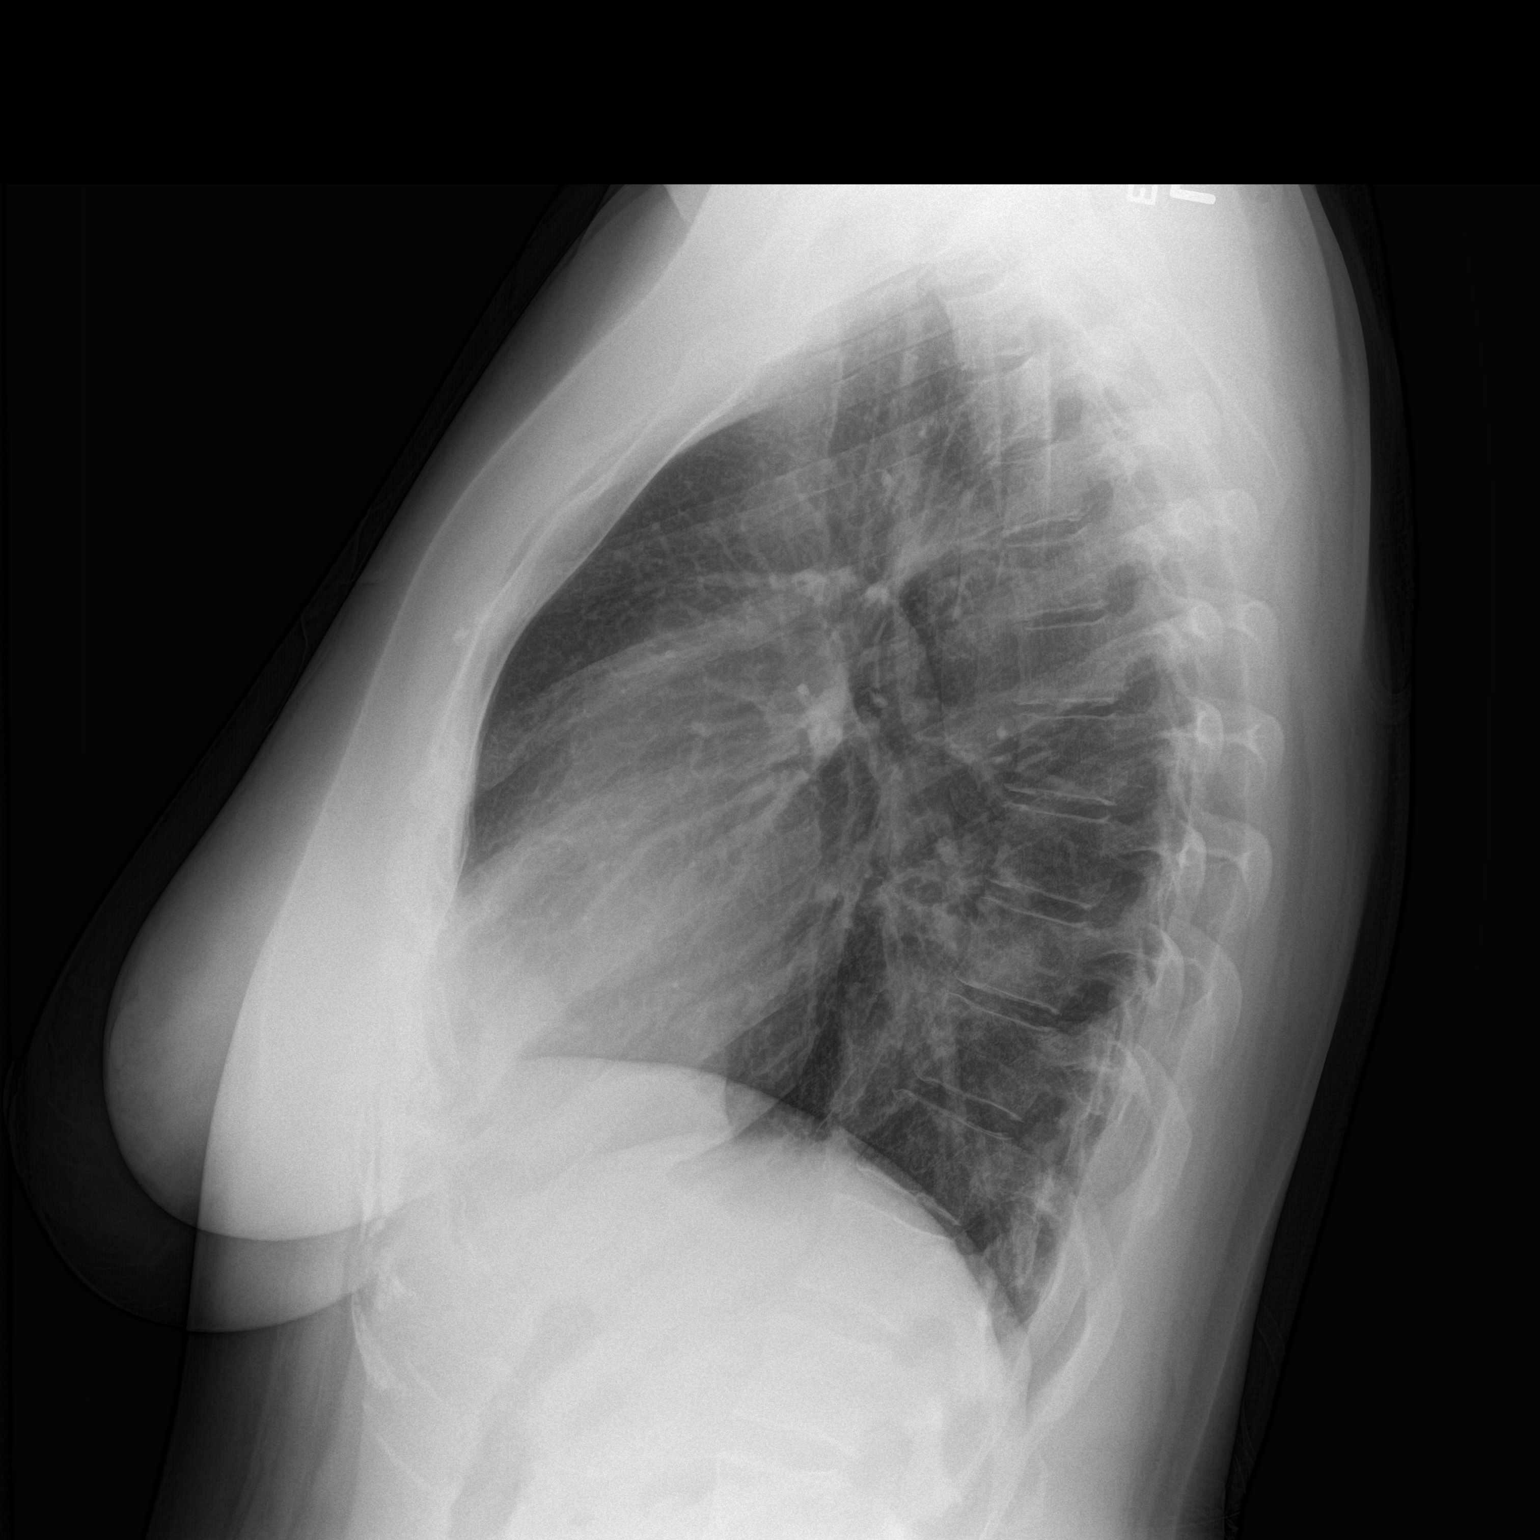

[2 of 2 positions shown; findings below may reference images not displayed]

FINDINGS: Left basilar pneumonia in the lower lobe and potentially in the
lingula. No effusion or cavitation. Normal cardiomediastinal
silhouette.
IMPRESSION: Left basilar pneumonia.

## 2014-10-07 MED ORDER — AZITHROMYCIN 250 MG PO TABS: 250.0000 mg | ORAL_TABLET | Freq: Every day | ORAL | Status: DC

## 2014-10-07 MED ORDER — ALBUTEROL SULFATE HFA 108 (90 BASE) MCG/ACT IN AERS
2.0000 | INHALATION_SPRAY | RESPIRATORY_TRACT | Status: DC | PRN
  Administered 2014-10-07: 2 via RESPIRATORY_TRACT
  Filled 2014-10-07: qty 6.7

## 2014-10-07 MED ORDER — HYDROCODONE-HOMATROPINE 5-1.5 MG/5ML PO SYRP: 5.0000 mL | ORAL_SOLUTION | Freq: Four times a day (QID) | ORAL | Status: DC | PRN

## 2014-10-07 NOTE — ED Provider Notes (Signed)
CSN: [id number]     Arrival date & time 10/07/14  6237 History   First MD Initiated Contact with Patient 10/07/14 [phone]     Chief Complaint  Patient presents with  . Cough     (Consider location/radiation/quality/duration/timing/severity/associated sxs/prior Treatment) HPI Comments: Patient presents with a cough that started 2-3 days ago. She states it's worsening since that time. It's nonproductive. She has some soreness in the center of her chest from the coughing. She has some posttussive emesis but no other vomiting. She's has had some myalgias and chills but no known fevers. She denies any shortness of breath other than with the coughing. She's tried Delsym and Mucinex without relief.  Patient is a 44 y.o. female presenting with cough.  Cough Associated symptoms: chest pain, chills, myalgias and shortness of breath   Associated symptoms: no diaphoresis, no fever, no headaches, no rash and no rhinorrhea     Past Medical History  Diagnosis Date  . Migraines   . Anxiety   . GERD (gastroesophageal reflux disease)    Past Surgical History  Procedure Laterality Date  . Mini tuck  2012  . Cesarean section  2007/2010  . Right ear surg  03/2008    cancerous lesion removed  . Wisdom tooth extraction    . Laparoscopic tubal ligation  06/12/2011    Procedure: LAPAROSCOPIC TUBAL LIGATION;  Surgeon: Daria Pastures, MD;  Location: [facility];  Service: Gynecology;  Laterality: N/A;  filshie clip  . US echocardiography  12-30-2007    EF 55-60%   Family History  Problem Relation Age of Onset  . Lymphoma Father   . Cancer Father     lymphoma  . Diabetes Brother    History  Substance Use Topics  . Smoking status: Former Smoker -- 0.25 packs/day for 6 years    Types: Cigarettes    Quit date: 03/29/2005  . Smokeless tobacco: Never Used  . Alcohol Use: Yes     Comment: socially   OB History    No data available     Review of Systems  Constitutional: Positive for chills and  fatigue. Negative for fever and diaphoresis.  HENT: Negative for congestion, rhinorrhea and sneezing.   Eyes: Negative.   Respiratory: Positive for cough and shortness of breath. Negative for chest tightness.   Cardiovascular: Positive for chest pain. Negative for leg swelling.  Gastrointestinal: Negative for nausea, vomiting, abdominal pain, diarrhea and blood in stool.  Genitourinary: Negative for frequency, hematuria, flank pain and difficulty urinating.  Musculoskeletal: Positive for myalgias. Negative for back pain and arthralgias.  Skin: Negative for rash.  Neurological: Negative for dizziness, speech difficulty, weakness, numbness and headaches.      Allergies  Review of patient's allergies indicates no known allergies.  Home Medications   Prior to Admission medications   Medication Sig Start Date End Date Taking? Authorizing Provider  azithromycin (ZITHROMAX) 250 MG tablet Take 1 tablet (250 mg total) by mouth daily. Take first 2 tablets together, then 1 every day until finished. 10/07/14   Malvin Johns, MD  esomeprazole (NEXIUM) 40 MG capsule Take 40 mg by mouth daily at 12 noon.    Historical Provider, MD  FLUoxetine (PROZAC) 20 MG capsule Take 20 mg by mouth daily.    Historical Provider, MD  HYDROcodone-homatropine (HYCODAN) 5-1.5 MG/5ML syrup Take 5 mLs by mouth every 6 (six) hours as needed for cough. 10/07/14   Malvin Johns, MD  ibuprofen (ADVIL,MOTRIN) 200 MG tablet Take 800 mg by mouth  daily as needed. For pain    Historical Provider, MD  lithium carbonate 150 MG capsule Take 150 mg by mouth 2 (two) times daily with a meal.    Historical Provider, MD  LORazepam (ATIVAN) 1 MG tablet Take 1 tablet (1 mg total) by mouth every 6 (six) hours as needed for anxiety. 11/11/12   Veryl Speak, MD  topiramate (TOPAMAX) 100 MG tablet Take 100 mg by mouth daily.    Historical Provider, MD   BP 140/80 mmHg  Pulse 111  Temp(Src) 98 F (36.7 C) (Oral)  Resp 18  Ht 5\' 10"  (1.778 m)   Wt 190 lb (86.183 kg)  BMI 27.26 kg/m2  SpO2 98%  LMP 09/13/2014 (Exact Date) Physical Exam  Constitutional: She is oriented to person, place, and time. She appears well-developed and well-nourished.  HENT:  Head: Normocephalic and atraumatic.  Eyes: Pupils are equal, round, and reactive to light.  Neck: Normal range of motion. Neck supple.  Cardiovascular: Normal rate, regular rhythm and normal heart sounds.   Pulmonary/Chest: Effort normal. No respiratory distress. She has wheezes (few end expiratory wheezing). She has no rales. She exhibits no tenderness.  Abdominal: Soft. Bowel sounds are normal. There is no tenderness. There is no rebound and no guarding.  Musculoskeletal: Normal range of motion. She exhibits no edema.  Lymphadenopathy:    She has no cervical adenopathy.  Neurological: She is alert and oriented to person, place, and time.  Skin: Skin is warm and dry. No rash noted.  Psychiatric: She has a normal mood and affect.    ED Course  Procedures (including critical care time) Labs Review Labs Reviewed - No data to display  Imaging Review Dg Chest 2 View  10/07/2014   CLINICAL DATA:  Progressively worsening cough.  EXAM: CHEST  2 VIEW  COMPARISON:  09/27/2009  FINDINGS: Left basilar pneumonia in the lower lobe and potentially in the lingula. No effusion or cavitation. Normal cardiomediastinal silhouette.  IMPRESSION: Left basilar pneumonia.   Electronically Signed   By: Monte Fantasia M.D.   On: 10/07/2014 08:13     EKG Interpretation None      MDM   Final diagnoses:  Community acquired pneumonia    Patient evidence of pneumonia on chest x-ray. I reviewed x-ray as well as the radiologist. Patient was started on Zithromax and Hycodan cough syrup. She has no shortness of breath. She was dispensed an albuterol inhaler. She's otherwise well-appearing with no vomiting. I feel that she can be treated as an outpatient. She was given return precautions and advised to  make a follow-up appointment with her primary care physician next week.    Malvin Johns, MD 10/07/14 [phone]

## 2014-10-07 NOTE — ED Notes (Addendum)
Patient states she developed a cough two days ago, which has progressively gotten worse.  Symptoms are associated with chills, body aches, and fatigue.

## 2014-10-07 NOTE — Discharge Instructions (Signed)

## 2014-11-09 ENCOUNTER — Other Ambulatory Visit: Payer: Self-pay

## 2014-11-09 ENCOUNTER — Encounter: Payer: Self-pay | Admitting: Internal Medicine

## 2014-11-09 DIAGNOSIS — Z1231 Encounter for screening mammogram for malignant neoplasm of breast: Secondary | ICD-10-CM

## 2014-11-17 ENCOUNTER — Other Ambulatory Visit: Payer: Self-pay | Admitting: Obstetrics and Gynecology

## 2014-11-18 LAB — CYTOLOGY - PAP

## 2014-12-13 ENCOUNTER — Encounter: Payer: Self-pay | Admitting: *Deleted

## 2014-12-19 ENCOUNTER — Ambulatory Visit: Payer: BLUE CROSS/BLUE SHIELD

## 2015-01-13 ENCOUNTER — Ambulatory Visit: Payer: BLUE CROSS/BLUE SHIELD | Admitting: Internal Medicine

## 2015-04-11 ENCOUNTER — Encounter (HOSPITAL_BASED_OUTPATIENT_CLINIC_OR_DEPARTMENT_OTHER): Payer: Self-pay | Admitting: Emergency Medicine

## 2015-04-11 ENCOUNTER — Emergency Department (HOSPITAL_BASED_OUTPATIENT_CLINIC_OR_DEPARTMENT_OTHER)
Admission: EM | Admit: 2015-04-11 | Discharge: 2015-04-11 | Disposition: A | Discharge status: Home / Self Care | Payer: BLUE CROSS/BLUE SHIELD | Attending: Emergency Medicine | Admitting: Emergency Medicine

## 2015-04-11 ENCOUNTER — Emergency Department (HOSPITAL_BASED_OUTPATIENT_CLINIC_OR_DEPARTMENT_OTHER): Payer: BLUE CROSS/BLUE SHIELD

## 2015-04-11 DIAGNOSIS — F419 Anxiety disorder, unspecified: Secondary | ICD-10-CM | POA: Insufficient documentation

## 2015-04-11 DIAGNOSIS — R05 Cough: Secondary | ICD-10-CM | POA: Diagnosis present

## 2015-04-11 DIAGNOSIS — Z87891 Personal history of nicotine dependence: Secondary | ICD-10-CM | POA: Diagnosis not present

## 2015-04-11 DIAGNOSIS — J069 Acute upper respiratory infection, unspecified: Secondary | ICD-10-CM

## 2015-04-11 DIAGNOSIS — Z8719 Personal history of other diseases of the digestive system: Secondary | ICD-10-CM | POA: Diagnosis not present

## 2015-04-11 DIAGNOSIS — Z8679 Personal history of other diseases of the circulatory system: Secondary | ICD-10-CM | POA: Insufficient documentation

## 2015-04-11 IMAGING — CR DG CHEST 2V
2 series · 2 of 2 positions shown · non-contrast
Comparison: [DATE]

CLINICAL DATA: Cough starting [REDACTED]

EXAM:
CHEST  2 VIEW

[w chest pa]
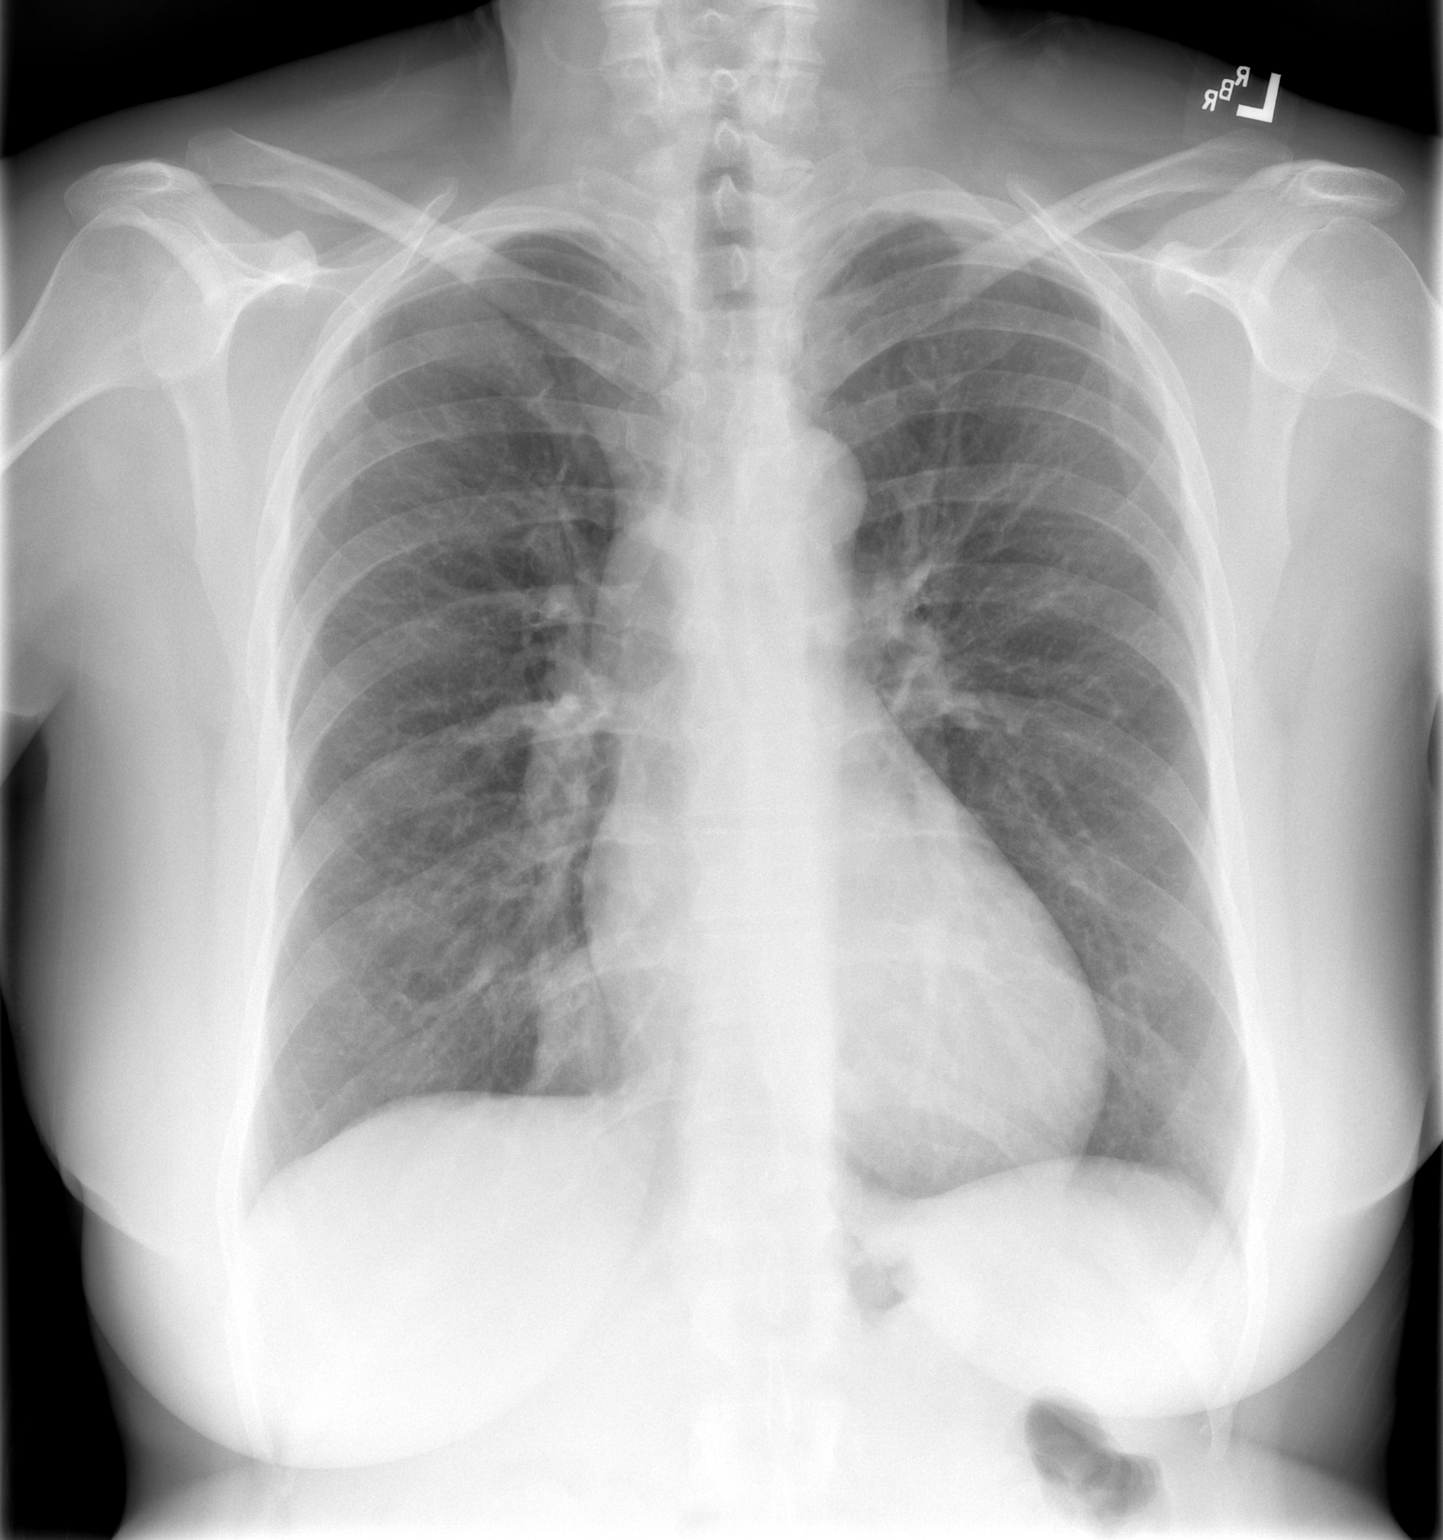

[w chest lat]
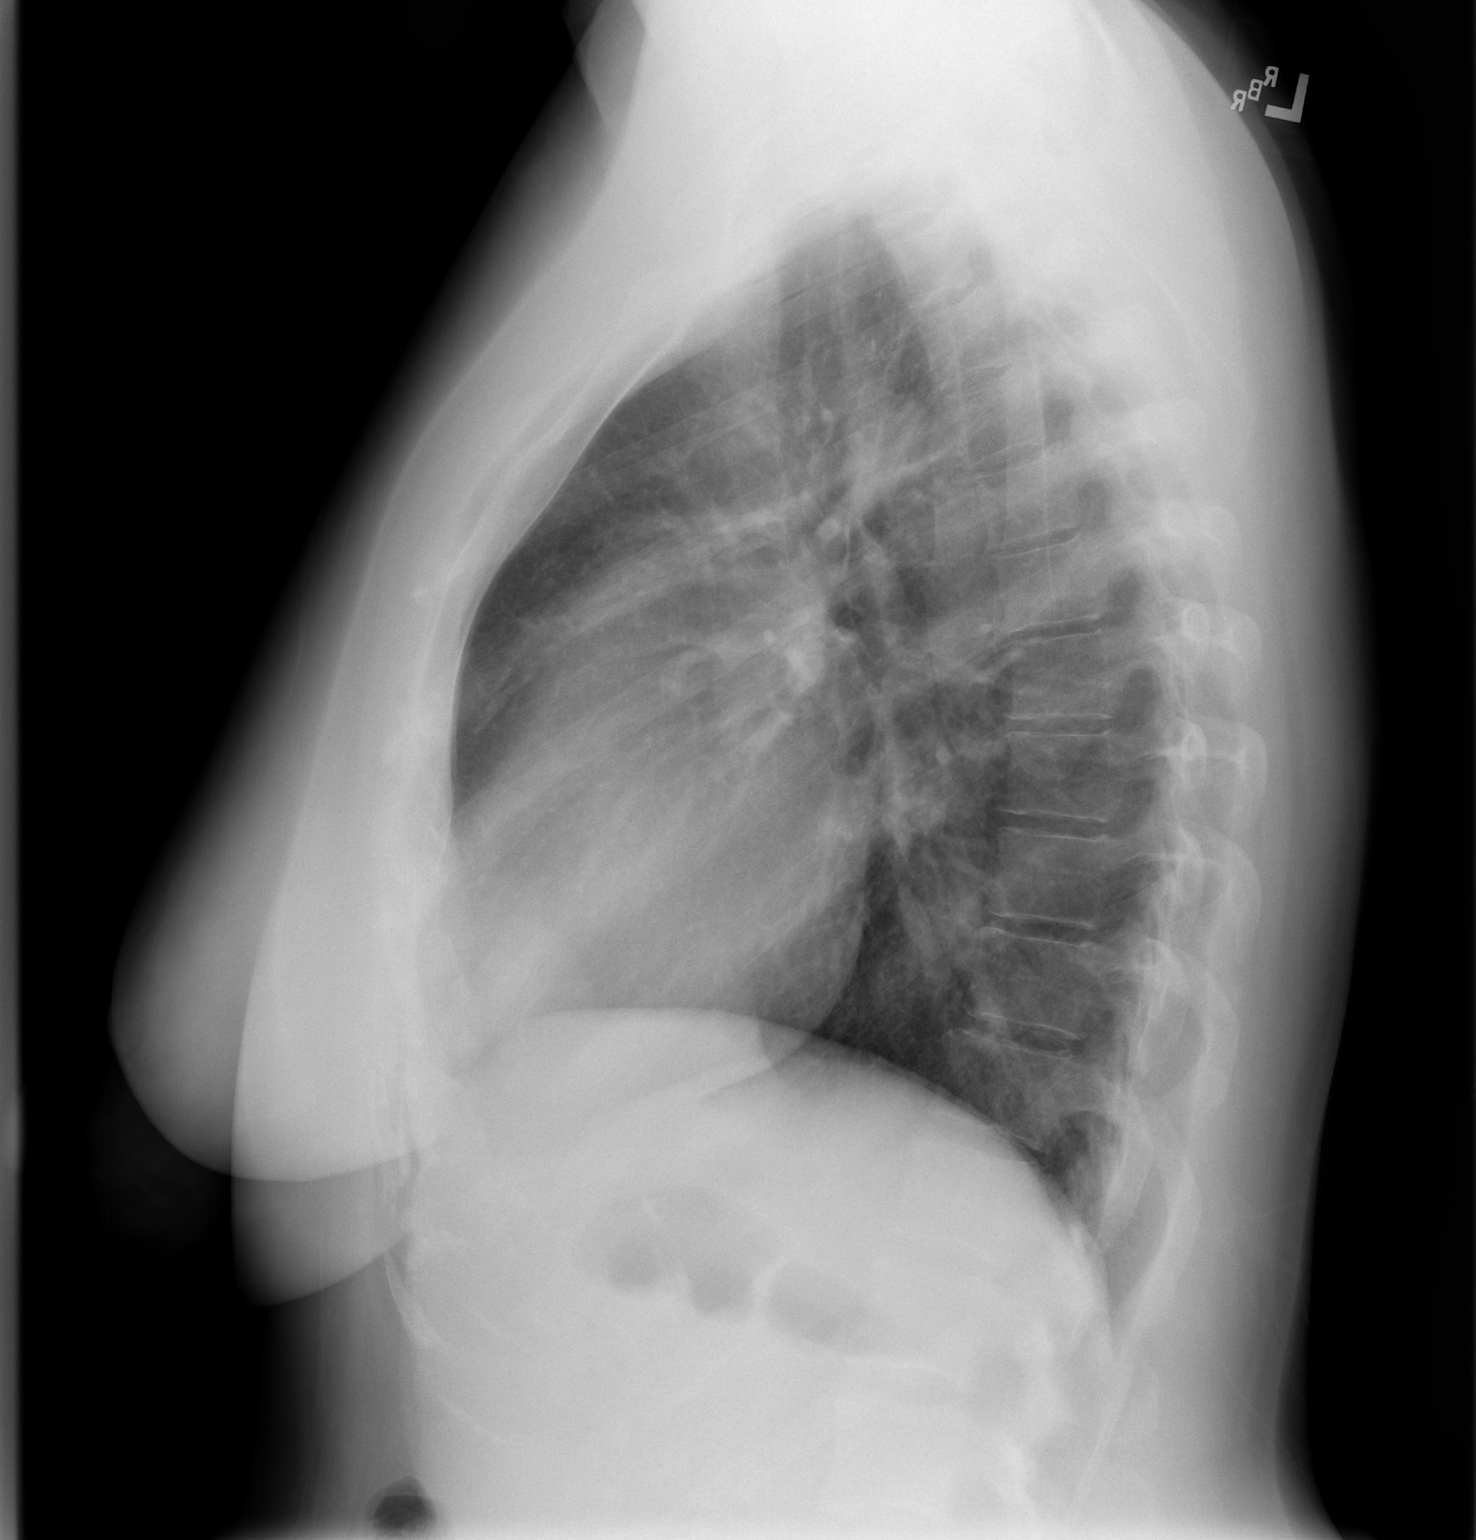

[2 of 2 positions shown; findings below may reference images not displayed]

FINDINGS: Cardiomediastinal silhouette is stable. Prominent fat pad in right
cardiophrenic angle again noted. No acute infiltrate or pulmonary
edema. Bony thorax is unremarkable.
IMPRESSION: No active cardiopulmonary disease.

## 2015-04-11 MED ORDER — HYDROCOD POLST-CPM POLST ER 10-8 MG/5ML PO SUER: 5.0000 mL | Freq: Four times a day (QID) | ORAL | Status: DC | PRN

## 2015-04-11 NOTE — ED Notes (Signed)
N/V/D started Thursday which then switched to a URI this weekend.  Now pt has cough,  some productive brown sputum.  Mostly non-productive.  Some ear ache, congestion.

## 2015-04-11 NOTE — ED Provider Notes (Signed)
CSN: [id number]     Arrival date & time 04/11/15  [id number] History   First MD Initiated Contact with Patient 04/11/15 [phone]     Chief Complaint  Patient presents with  . Cough     (Consider location/radiation/quality/duration/timing/severity/associated sxs/prior Treatment) HPI Comments: Patient is a 44 year old female with no significant past medical history. She presents for evaluation of nasal congestion, postnasal drip, and cough and was started 2 days ago. She denies any fevers or chills. She denies any chest pain or shortness of breath. She reports a history of pneumonia approximately one year ago. She also reports several days of diarrhea leading up to her URI symptoms.  Patient is a 44 y.o. female presenting with cough. The history is provided by the patient.  Cough Cough characteristics:  Productive Sputum characteristics:  White Severity:  Moderate Duration:  2 days Timing:  Constant Progression:  Worsening Chronicity:  New   Past Medical History  Diagnosis Date  . Migraines   . Anxiety   . GERD (gastroesophageal reflux disease)   . Hiatal hernia    Past Surgical History  Procedure Laterality Date  . Mini tuck  2012  . Cesarean section  2007/2010  . Right ear surg  03/2008    cancerous lesion removed  . Wisdom tooth extraction    . Laparoscopic tubal ligation  06/12/2011    Procedure: LAPAROSCOPIC TUBAL LIGATION;  Surgeon: Daria Pastures, MD;  Location: [facility];  Service: Gynecology;  Laterality: N/A;  filshie clip  . US echocardiography  12-30-2007    EF 55-60%   Family History  Problem Relation Age of Onset  . Lymphoma Father   . Cancer Father     lymphoma  . Diabetes Brother    Social History  Substance Use Topics  . Smoking status: Former Smoker -- 0.25 packs/day for 6 years    Types: Cigarettes    Quit date: 03/29/2005  . Smokeless tobacco: Never Used  . Alcohol Use: Yes     Comment: socially   OB History    No data available     Review of  Systems  Respiratory: Positive for cough.   All other systems reviewed and are negative.     Allergies  Review of patient's allergies indicates no known allergies.  Home Medications   Prior to Admission medications   Medication Sig Start Date End Date Taking? Authorizing Provider  LORazepam (ATIVAN) 1 MG tablet Take 1 tablet (1 mg total) by mouth every 6 (six) hours as needed for anxiety. 11/11/12   Veryl Speak, MD   BP 148/95 mmHg  Temp(Src) 98.2 F (36.8 C) (Oral)  Resp 18  Ht 5\' 10"  (1.778 m)  Wt 200 lb (90.719 kg)  BMI 28.70 kg/m2  SpO2 100%  LMP 04/11/2015 Physical Exam  Constitutional: She is oriented to person, place, and time. She appears well-developed and well-nourished. No distress.  HENT:  Head: Normocephalic and atraumatic.  Neck: Normal range of motion. Neck supple.  Cardiovascular: Normal rate and regular rhythm.  Exam reveals no gallop and no friction rub.   No murmur heard. Pulmonary/Chest: Effort normal and breath sounds normal. No respiratory distress. She has no wheezes. She has no rales.  Abdominal: Soft. Bowel sounds are normal. She exhibits no distension. There is no tenderness.  Musculoskeletal: Normal range of motion.  Neurological: She is alert and oriented to person, place, and time.  Skin: Skin is warm and dry. She is not diaphoretic.  Nursing note and vitals reviewed.  ED Course  Procedures (including critical care time) Labs Review Labs Reviewed - No data to display  Imaging Review Dg Chest 2 View  04/11/2015  CLINICAL DATA:  Cough starting Saturday EXAM: CHEST  2 VIEW COMPARISON:  10/07/2014 FINDINGS: Cardiomediastinal silhouette is stable. Prominent fat pad in right cardiophrenic angle again noted. No acute infiltrate or pulmonary edema. Bony thorax is unremarkable. IMPRESSION: No active cardiopulmonary disease. Electronically Signed   By: Lahoma Crocker M.D.   On: 04/11/2015 09:02   I have personally reviewed and evaluated these images  and lab results as part of my medical decision-making.   EKG Interpretation None      MDM   Final diagnoses:  None    Symptoms most likely viral in nature. Her chest x-ray and lung exam are clear. There is no hypoxia. She will be discharged with Tussionex which she can take as needed for her cough. She is to return as needed.    Veryl Speak, MD 04/11/15 [phone]

## 2015-04-11 NOTE — Discharge Instructions (Signed)
Tussionex as prescribed as needed for cough.  Continue over-the-counter medications as needed for symptomatic relief.  Return to the emergency department if symptoms significantly worsen or change, and follow-up with your primary Dr. if not improving in the next week.   Upper Respiratory Infection, Adult Most upper respiratory infections (URIs) are a viral infection of the air passages leading to the lungs. A URI affects the nose, throat, and upper air passages. The most common type of URI is nasopharyngitis and is typically referred to as "the common cold." URIs run their course and usually go away on their own. Most of the time, a URI does not require medical attention, but sometimes a bacterial infection in the upper airways can follow a viral infection. This is called a secondary infection. Sinus and middle ear infections are common types of secondary upper respiratory infections. Bacterial pneumonia can also complicate a URI. A URI can worsen asthma and chronic obstructive pulmonary disease (COPD). Sometimes, these complications can require emergency medical care and may be life threatening.  CAUSES Almost all URIs are caused by viruses. A virus is a type of germ and can spread from one person to another.  RISKS FACTORS You may be at risk for a URI if:   You smoke.   You have chronic heart or lung disease.  You have a weakened defense (immune) system.   You are very young or very old.   You have nasal allergies or asthma.  You work in crowded or poorly ventilated areas.  You work in health care facilities or schools. SIGNS AND SYMPTOMS  Symptoms typically develop 2-3 days after you come in contact with a cold virus. Most viral URIs last 7-10 days. However, viral URIs from the influenza virus (flu virus) can last 14-18 days and are typically more severe. Symptoms may include:   Runny or stuffy (congested) nose.   Sneezing.   Cough.   Sore throat.   Headache.    Fatigue.   Fever.   Loss of appetite.   Pain in your forehead, behind your eyes, and over your cheekbones (sinus pain).  Muscle aches.  DIAGNOSIS  Your health care provider may diagnose a URI by:  Physical exam.  Tests to check that your symptoms are not due to another condition such as:  Strep throat.  Sinusitis.  Pneumonia.  Asthma. TREATMENT  A URI goes away on its own with time. It cannot be cured with medicines, but medicines may be prescribed or recommended to relieve symptoms. Medicines may help:  Reduce your fever.  Reduce your cough.  Relieve nasal congestion. HOME CARE INSTRUCTIONS   Take medicines only as directed by your health care provider.   Gargle warm saltwater or take cough drops to comfort your throat as directed by your health care provider.  Use a warm mist humidifier or inhale steam from a shower to increase air moisture. This may make it easier to breathe.  Drink enough fluid to keep your urine clear or pale yellow.   Eat soups and other clear broths and maintain good nutrition.   Rest as needed.   Return to work when your temperature has returned to normal or as your health care provider advises. You may need to stay home longer to avoid infecting others. You can also use a face mask and careful hand washing to prevent spread of the virus.  Increase the usage of your inhaler if you have asthma.   Do not use any tobacco products, including cigarettes,  chewing tobacco, or electronic cigarettes. If you need help quitting, ask your health care provider. PREVENTION  The best way to protect yourself from getting a cold is to practice good hygiene.   Avoid oral or hand contact with people with cold symptoms.   Wash your hands often if contact occurs.  There is no clear evidence that vitamin C, vitamin E, echinacea, or exercise reduces the chance of developing a cold. However, it is always recommended to get plenty of rest,  exercise, and practice good nutrition.  SEEK MEDICAL CARE IF:   You are getting worse rather than better.   Your symptoms are not controlled by medicine.   You have chills.  You have worsening shortness of breath.  You have brown or red mucus.  You have yellow or brown nasal discharge.  You have pain in your face, especially when you bend forward.  You have a fever.  You have swollen neck glands.  You have pain while swallowing.  You have white areas in the back of your throat. SEEK IMMEDIATE MEDICAL CARE IF:   You have severe or persistent:  Headache.  Ear pain.  Sinus pain.  Chest pain.  You have chronic lung disease and any of the following:  Wheezing.  Prolonged cough.  Coughing up blood.  A change in your usual mucus.  You have a stiff neck.  You have changes in your:  Vision.  Hearing.  Thinking.  Mood. MAKE SURE YOU:   Understand these instructions.  Will watch your condition.  Will get help right away if you are not doing well or get worse.   This information is not intended to replace advice given to you by your health care provider. Make sure you discuss any questions you have with your health care provider.   Document Released: 10/09/2000 Document Revised: 08/30/2014 Document Reviewed: 07/21/2013 Elsevier Interactive Patient Education [employer].

## 2015-04-30 HISTORY — PX: COLONOSCOPY: SHX174

## 2015-08-13 ENCOUNTER — Emergency Department (HOSPITAL_BASED_OUTPATIENT_CLINIC_OR_DEPARTMENT_OTHER)
Admission: EM | Admit: 2015-08-13 | Discharge: 2015-08-13 | Disposition: A | Discharge status: Home / Self Care | Payer: BLUE CROSS/BLUE SHIELD | Attending: Emergency Medicine | Admitting: Emergency Medicine

## 2015-08-13 ENCOUNTER — Emergency Department (HOSPITAL_BASED_OUTPATIENT_CLINIC_OR_DEPARTMENT_OTHER): Payer: BLUE CROSS/BLUE SHIELD

## 2015-08-13 ENCOUNTER — Encounter (HOSPITAL_BASED_OUTPATIENT_CLINIC_OR_DEPARTMENT_OTHER): Payer: Self-pay | Admitting: *Deleted

## 2015-08-13 DIAGNOSIS — Z8679 Personal history of other diseases of the circulatory system: Secondary | ICD-10-CM | POA: Diagnosis not present

## 2015-08-13 DIAGNOSIS — Z87891 Personal history of nicotine dependence: Secondary | ICD-10-CM | POA: Diagnosis not present

## 2015-08-13 DIAGNOSIS — Z8719 Personal history of other diseases of the digestive system: Secondary | ICD-10-CM | POA: Insufficient documentation

## 2015-08-13 DIAGNOSIS — F419 Anxiety disorder, unspecified: Secondary | ICD-10-CM | POA: Insufficient documentation

## 2015-08-13 DIAGNOSIS — M79673 Pain in unspecified foot: Secondary | ICD-10-CM

## 2015-08-13 DIAGNOSIS — M79671 Pain in right foot: Secondary | ICD-10-CM | POA: Diagnosis present

## 2015-08-13 DIAGNOSIS — R21 Rash and other nonspecific skin eruption: Secondary | ICD-10-CM | POA: Insufficient documentation

## 2015-08-13 DIAGNOSIS — L02619 Cutaneous abscess of unspecified foot: Secondary | ICD-10-CM

## 2015-08-13 DIAGNOSIS — L03119 Cellulitis of unspecified part of limb: Secondary | ICD-10-CM

## 2015-08-13 IMAGING — DX DG FOOT COMPLETE 3+V*R*
3 series · 3 of 3 positions shown · non-contrast
Comparison: None.

CLINICAL DATA: Initial evaluation for acute foot pain.  No injury.

EXAM:
RIGHT FOOT COMPLETE - 3+ VIEW

[foot ap]
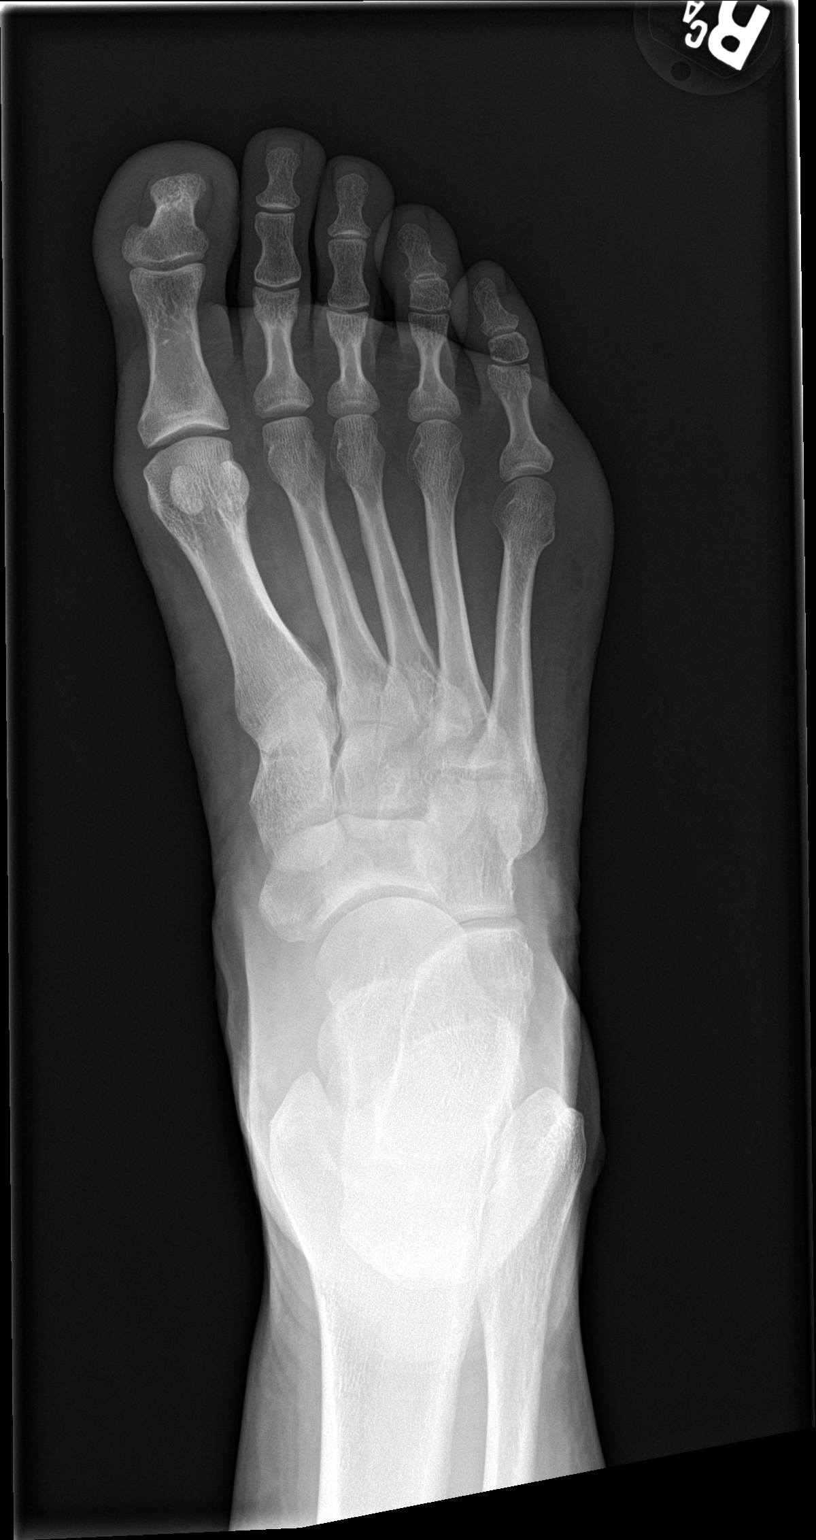

[foot obl]
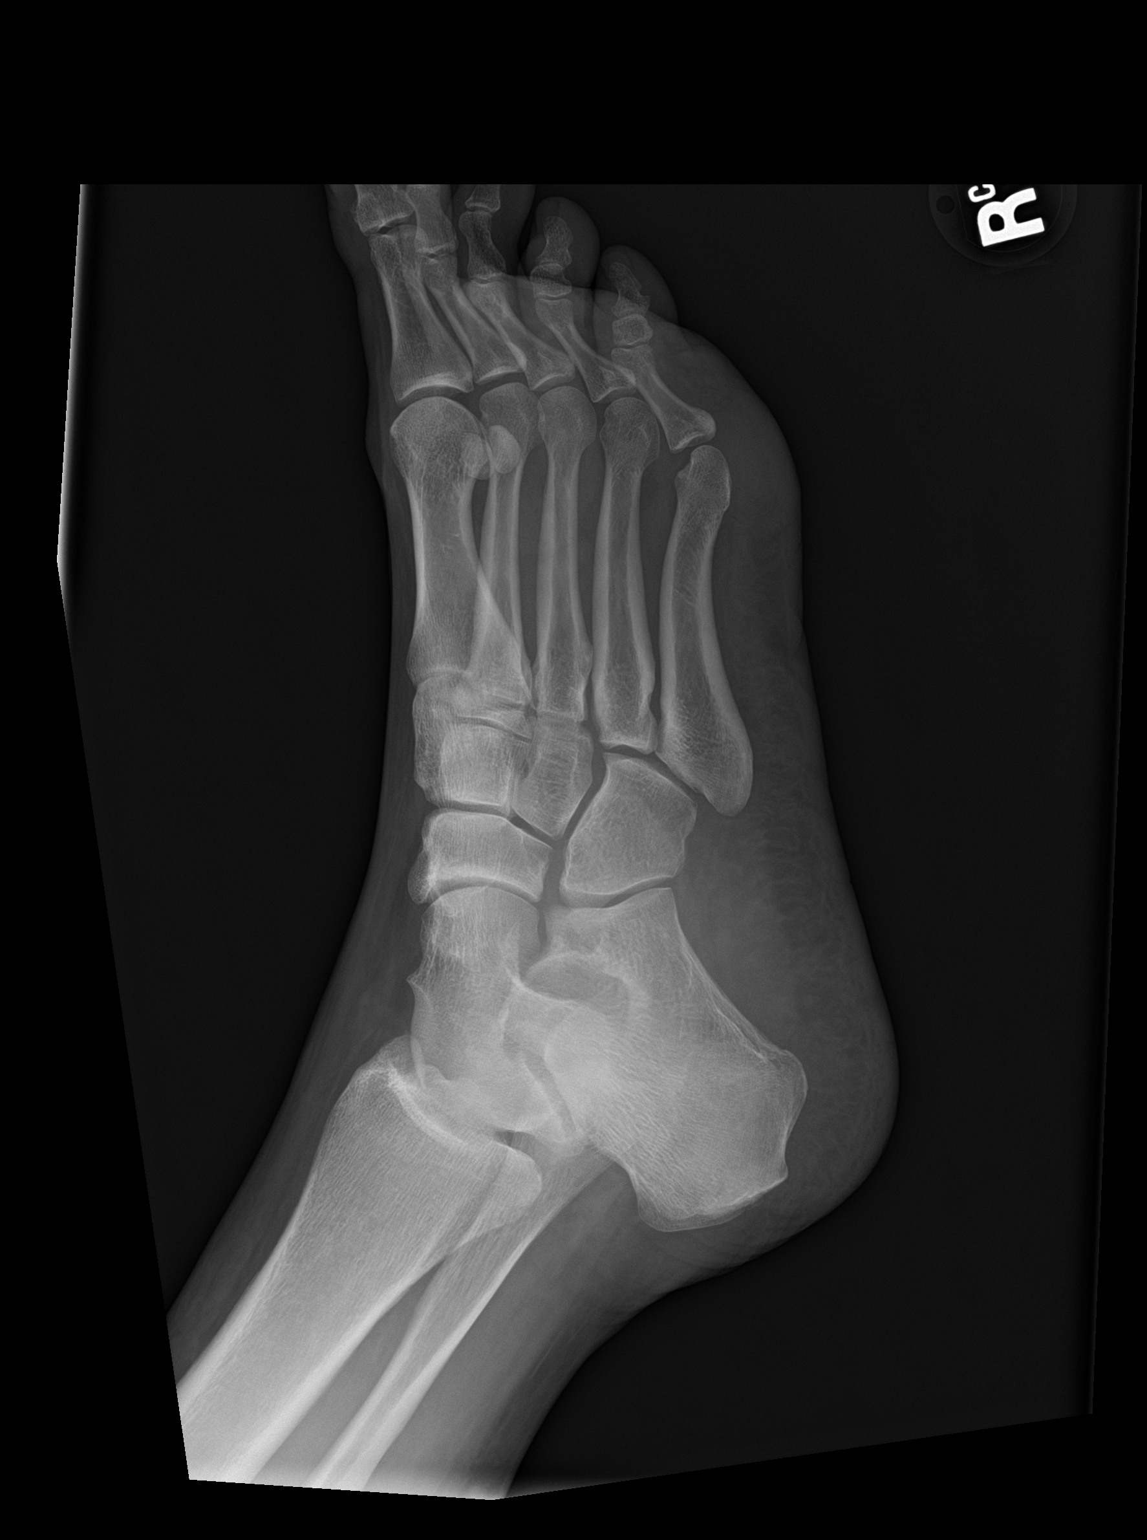

[foot lat]
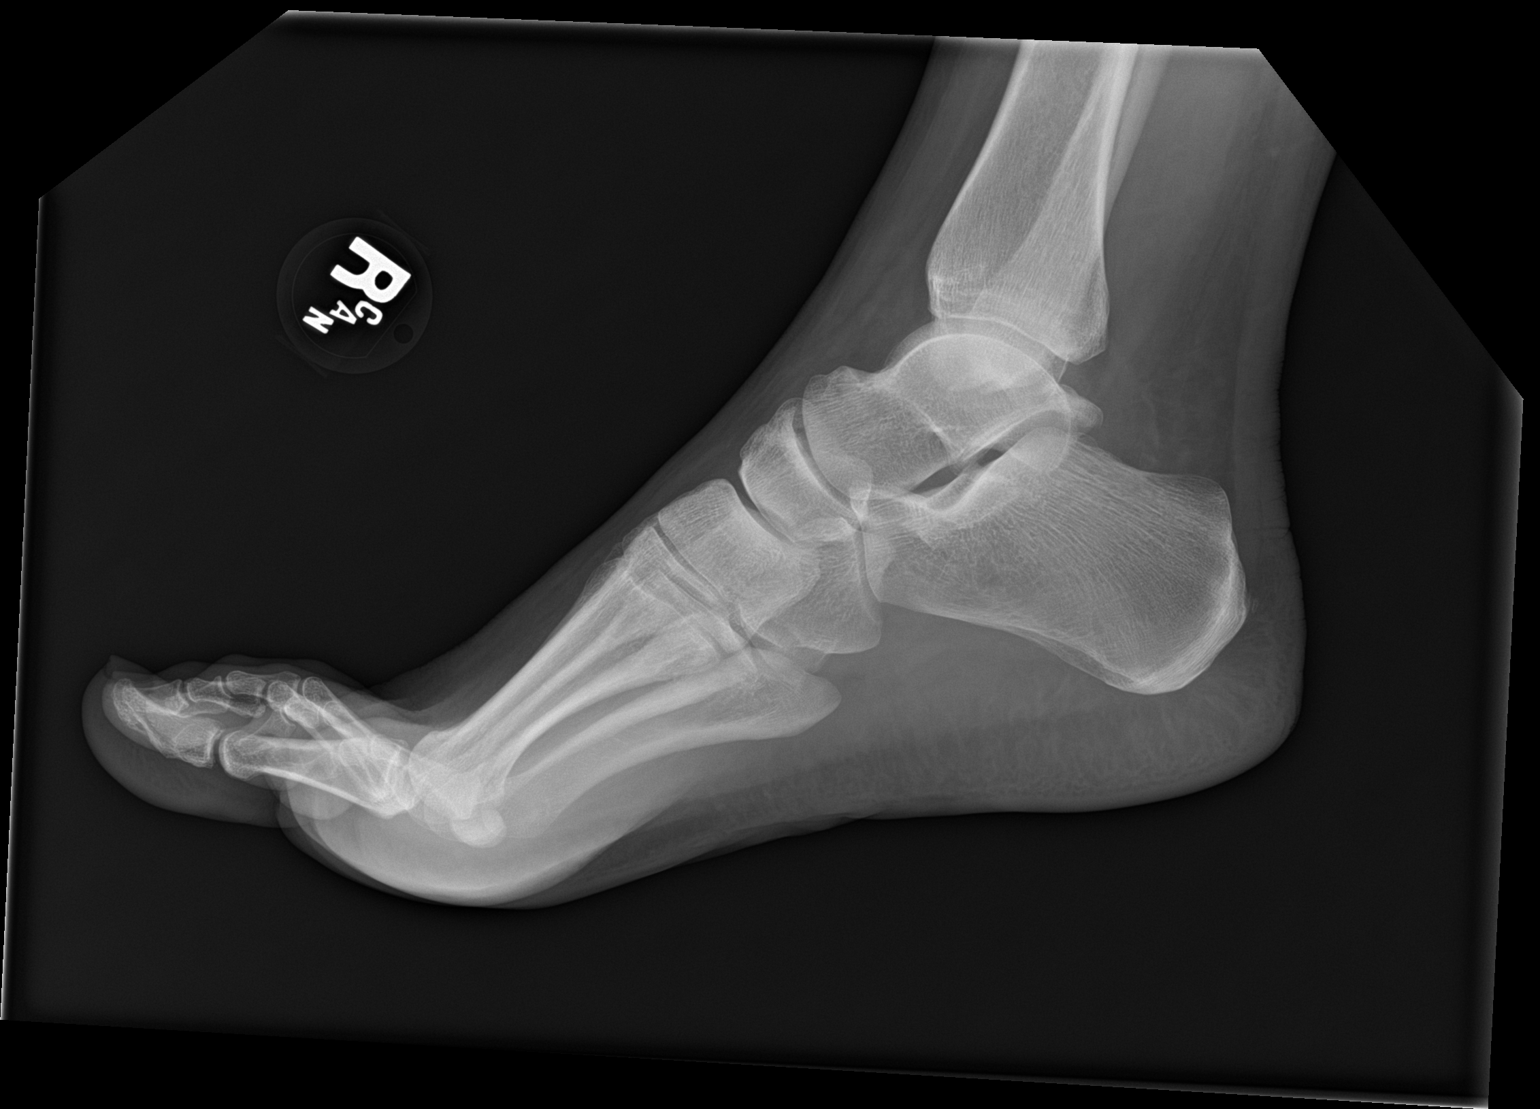

[3 of 3 positions shown; findings below may reference images not displayed]

FINDINGS: There is no evidence of fracture or dislocation. There is no
evidence of arthropathy or other focal bone abnormality. Soft
tissues are unremarkable.
IMPRESSION: No acute osseous abnormality about the foot.

## 2015-08-13 MED ORDER — DICLOFENAC SODIUM ER 100 MG PO TB24: 100.0000 mg | ORAL_TABLET | Freq: Every day | ORAL | Status: DC

## 2015-08-13 MED ORDER — FLUCONAZOLE 150 MG PO TABS: 150.0000 mg | ORAL_TABLET | Freq: Once | ORAL | Status: DC

## 2015-08-13 MED ORDER — SULFAMETHOXAZOLE-TRIMETHOPRIM 800-160 MG PO TABS
1.0000 | ORAL_TABLET | Freq: Once | ORAL | Status: AC
  Administered 2015-08-13: 1 via ORAL
  Filled 2015-08-13: qty 1

## 2015-08-13 MED ORDER — SULFAMETHOXAZOLE-TRIMETHOPRIM 800-160 MG PO TABS: 1.0000 | ORAL_TABLET | Freq: Two times a day (BID) | ORAL | Status: AC

## 2015-08-13 MED ORDER — NAPROXEN 250 MG PO TABS
500.0000 mg | ORAL_TABLET | Freq: Once | ORAL | Status: AC
  Administered 2015-08-13: 500 mg via ORAL
  Filled 2015-08-13: qty 2

## 2015-08-13 NOTE — Discharge Instructions (Signed)

## 2015-08-13 NOTE — ED Notes (Signed)
MD with pt  

## 2015-08-13 NOTE — ED Notes (Signed)
Returned from xray

## 2015-08-13 NOTE — ED Provider Notes (Addendum)
CSN: [id number]     Arrival date & time 08/13/15  0404 History   First MD Initiated Contact with Patient 08/13/15 0530     Chief Complaint  Patient presents with  . Foot Pain     (Consider location/radiation/quality/duration/timing/severity/associated sxs/prior Treatment) Patient is a 45 y.o. female presenting with lower extremity pain. The history is provided by the patient.  Foot Pain This is a new problem. The current episode started yesterday. The problem occurs constantly. The problem has not changed since onset.Pertinent negatives include no chest pain, no abdominal pain, no headaches and no shortness of breath. Nothing aggravates the symptoms. Nothing relieves the symptoms. She has tried nothing for the symptoms. The treatment provided no relief.    Past Medical History  Diagnosis Date  . Migraines   . Anxiety   . GERD (gastroesophageal reflux disease)   . Hiatal hernia    Past Surgical History  Procedure Laterality Date  . Mini tuck  2012  . Cesarean section  2007/2010  . Right ear surg  03/2008    cancerous lesion removed  . Wisdom tooth extraction    . Laparoscopic tubal ligation  06/12/2011    Procedure: LAPAROSCOPIC TUBAL LIGATION;  Surgeon: Daria Pastures, MD;  Location: [facility];  Service: Gynecology;  Laterality: N/A;  filshie clip  . US echocardiography  12-30-2007    EF 55-60%   Family History  Problem Relation Age of Onset  . Lymphoma Father   . Cancer Father     lymphoma  . Diabetes Brother    Social History  Substance Use Topics  . Smoking status: Former Smoker -- 0.25 packs/day for 6 years    Types: Cigarettes    Quit date: 03/29/2005  . Smokeless tobacco: Never Used  . Alcohol Use: Yes     Comment: socially   OB History    No data available     Review of Systems  Constitutional: Negative for fever.  Respiratory: Negative for shortness of breath.   Cardiovascular: Negative for chest pain, palpitations and leg swelling.  Gastrointestinal:  Negative for abdominal pain.  Musculoskeletal: Positive for arthralgias.  Skin: Positive for rash.  Neurological: Negative for headaches.  All other systems reviewed and are negative.     Allergies  Review of patient's allergies indicates no known allergies.  Home Medications   Prior to Admission medications   Medication Sig Start Date End Date Taking? Authorizing Provider  Amphetamine-Dextroamphetamine (ADDERALL PO) Take by mouth.   Yes Historical Provider, MD  chlorpheniramine-HYDROcodone (TUSSIONEX PENNKINETIC ER) 10-8 MG/5ML SUER Take 5 mLs by mouth every 6 (six) hours as needed for cough. 04/11/15   Veryl Speak, MD  LORazepam (ATIVAN) 1 MG tablet Take 1 tablet (1 mg total) by mouth every 6 (six) hours as needed for anxiety. 11/11/12   Veryl Speak, MD   BP 146/86 mmHg  Pulse 92  Temp(Src) 98.3 F (36.8 C) (Oral)  Resp 18  Ht 5\' 10"  (1.778 m)  Wt 200 lb (90.719 kg)  BMI 28.70 kg/m2  SpO2 99%  LMP 08/06/2015 (Approximate) Physical Exam  Constitutional: She is oriented to person, place, and time. She appears well-developed and well-nourished. No distress.  HENT:  Head: Normocephalic and atraumatic.  Mouth/Throat: Oropharynx is clear and moist.  Eyes: Conjunctivae are normal. Pupils are equal, round, and reactive to light.  Neck: Normal range of motion. Neck supple.  Cardiovascular: Normal rate, regular rhythm and intact distal pulses.   Pulmonary/Chest: Effort normal and breath sounds normal.  No respiratory distress. She has no wheezes. She has no rales.  Abdominal: Soft. Bowel sounds are normal. There is no tenderness. There is no rebound and no guarding.  Musculoskeletal: Normal range of motion.       Right ankle: She exhibits normal range of motion, no swelling, no ecchymosis, no deformity, no laceration and normal pulse. No tenderness. No lateral malleolus, no medial malleolus, no AITFL, no CF ligament, no posterior TFL, no head of 5th metatarsal and no proximal fibula  tenderness found. Achilles tendon normal.       Right foot: There is normal range of motion, no tenderness, no bony tenderness, no swelling, normal capillary refill, no crepitus, no deformity and no laceration.       Feet:  No pain of the plantar fascia   No swelling or tenderness of the calves.  Negative Homan's sign  Feet a warm and symmetric in color and temp.   Neurological: She is alert and oriented to person, place, and time.  Skin: Skin is warm and dry.  Psychiatric: She has a normal mood and affect.    ED Course  Procedures (including critical care time) Labs Review Labs Reviewed - No data to display  Imaging Review Dg Foot Complete Right  08/13/2015  CLINICAL DATA:  Initial evaluation for acute foot pain.  No injury. EXAM: RIGHT FOOT COMPLETE - 3+ VIEW COMPARISON:  None. FINDINGS: There is no evidence of fracture or dislocation. There is no evidence of arthropathy or other focal bone abnormality. Soft tissues are unremarkable. IMPRESSION: No acute osseous abnormality about the foot. Electronically Signed   By: Jeannine Boga M.D.   On: 08/13/2015 05:31   I have personally reviewed and evaluated these images and lab results as part of my medical decision-making.   EKG Interpretation None      MDM   Final diagnoses:  Foot pain    Filed Vitals:   08/13/15 0409  BP: 146/86  Pulse: 92  Temp: 98.3 F (36.8 C)  Resp: 18   Medications  sulfamethoxazole-trimethoprim (BACTRIM DS,SEPTRA DS) 800-160 MG per tablet 1 tablet (not administered)  naproxen (NAPROSYN) tablet 500 mg (not administered)     Patient has had feet in the water a a nail spas to get pedicure. Skin is dry.  Lubricate well with eucerin cream.  Keep clean and dry.  No more pedicures. I suspect this is an early cellulitis and will treat with bactrim and NSAIDs.  Follow up with podiatry for recheck.  Strict return precautions given    Imani Fiebelkorn, MD 08/13/15 0546  Jolyne Laye, MD 08/13/15  [phone]

## 2015-08-13 NOTE — ED Notes (Signed)
Pt states that her right foot started hurting Saturday. Denies any injury to foot. Pt does have some superficial veins to her inner foot that appear red. States this is the area that hurts but also radiates into the bottom of her foot. Swelling noted to posterior aspect of foot. Pt states area to her right foot that is red is itching. Pt states she sits a lot for her job. Denies any sob.

## 2015-09-06 ENCOUNTER — Encounter: Payer: Self-pay | Admitting: Internal Medicine

## 2015-09-07 ENCOUNTER — Ambulatory Visit
Admission: RE | Admit: 2015-09-07 | Discharge: 2015-09-07 | Disposition: A | Discharge status: Home / Self Care | Payer: BLUE CROSS/BLUE SHIELD | Source: Ambulatory Visit

## 2015-09-07 DIAGNOSIS — Z1231 Encounter for screening mammogram for malignant neoplasm of breast: Secondary | ICD-10-CM

## 2015-10-17 ENCOUNTER — Other Ambulatory Visit: Payer: Self-pay | Admitting: Internal Medicine

## 2015-10-17 ENCOUNTER — Other Ambulatory Visit: Payer: BLUE CROSS/BLUE SHIELD | Admitting: Internal Medicine

## 2015-10-17 DIAGNOSIS — Z1329 Encounter for screening for other suspected endocrine disorder: Secondary | ICD-10-CM

## 2015-10-17 DIAGNOSIS — Z1321 Encounter for screening for nutritional disorder: Secondary | ICD-10-CM

## 2015-10-17 DIAGNOSIS — Z13220 Encounter for screening for lipoid disorders: Secondary | ICD-10-CM

## 2015-10-17 DIAGNOSIS — Z130 Encounter for screening for diseases of the blood and blood-forming organs and certain disorders involving the immune mechanism: Secondary | ICD-10-CM

## 2015-10-17 DIAGNOSIS — Z0000 Encounter for general adult medical examination without abnormal findings: Secondary | ICD-10-CM

## 2015-10-17 LAB — COMPLETE METABOLIC PANEL WITH GFR
ALT: 11 U/L (ref 6–29)
AST: 13 U/L (ref 10–35)
Albumin: 3.9 g/dL (ref 3.6–5.1)
Alkaline Phosphatase: 40 U/L (ref 33–115)
BUN: 12 mg/dL (ref 7–25)
CHLORIDE: 103 mmol/L (ref 98–110)
CO2: 25 mmol/L (ref 20–31)
Calcium: 9.2 mg/dL (ref 8.6–10.2)
Creat: 0.77 mg/dL (ref 0.50–1.10)
Glucose, Bld: 93 mg/dL (ref 65–99)
Potassium: 4.6 mmol/L (ref 3.5–5.3)
SODIUM: 138 mmol/L (ref 135–146)
Total Bilirubin: 0.4 mg/dL (ref 0.2–1.2)
Total Protein: 6.6 g/dL (ref 6.1–8.1)

## 2015-10-17 LAB — CBC WITH DIFFERENTIAL/PLATELET
BASOS ABS: 0 {cells}/uL (ref 0–200)
BASOS PCT: 0 %
EOS ABS: 88 {cells}/uL (ref 15–500)
EOS PCT: 1 %
HCT: 40.6 % (ref 35.0–45.0)
Hemoglobin: 13 g/dL (ref 11.7–15.5)
LYMPHS ABS: 2464 {cells}/uL (ref 850–3900)
Lymphocytes Relative: 28 %
MCH: 24.8 pg — AB (ref 27.0–33.0)
MCHC: 32 g/dL (ref 32.0–36.0)
MCV: 77.3 fL — AB (ref 80.0–100.0)
MPV: 10.1 fL (ref 7.5–12.5)
Monocytes Absolute: 528 cells/uL (ref 200–950)
Monocytes Relative: 6 %
NEUTROS ABS: 5720 {cells}/uL (ref 1500–7800)
Neutrophils Relative %: 65 %
PLATELETS: 352 10*3/uL (ref 140–400)
RBC: 5.25 MIL/uL — ABNORMAL HIGH (ref 3.80–5.10)
RDW: 15.3 % — AB (ref 11.0–15.0)
WBC: 8.8 10*3/uL (ref 3.8–10.8)

## 2015-10-17 LAB — TSH: TSH: 0.01 mIU/L — ABNORMAL LOW

## 2015-10-17 LAB — LIPID PANEL
CHOL/HDL RATIO: 3.2 ratio (ref <=5.0)
Cholesterol: 148 mg/dL (ref 125–200)
HDL: 46 mg/dL (ref >=46)
LDL CALC: 66 mg/dL (ref <130)
TRIGLYCERIDES: 182 mg/dL — AB (ref <150)
VLDL: 36 mg/dL — AB (ref <30)

## 2015-10-18 LAB — VITAMIN D 25 HYDROXY (VIT D DEFICIENCY, FRACTURES): Vit D, 25-Hydroxy: 40 ng/mL (ref 30–100)

## 2015-10-20 ENCOUNTER — Encounter: Payer: Self-pay | Admitting: Internal Medicine

## 2015-10-23 ENCOUNTER — Encounter: Payer: Self-pay | Admitting: Internal Medicine

## 2015-10-23 ENCOUNTER — Ambulatory Visit (INDEPENDENT_AMBULATORY_CARE_PROVIDER_SITE_OTHER): Payer: BLUE CROSS/BLUE SHIELD | Admitting: Internal Medicine

## 2015-10-23 VITALS — BP 136/80 | HR 100 | Temp 98.0°F | Resp 20 | Ht 69.0 in | Wt 215.0 lb

## 2015-10-23 DIAGNOSIS — E669 Obesity, unspecified: Secondary | ICD-10-CM | POA: Diagnosis not present

## 2015-10-23 DIAGNOSIS — Z9889 Other specified postprocedural states: Secondary | ICD-10-CM | POA: Insufficient documentation

## 2015-10-23 DIAGNOSIS — Z0000 Encounter for general adult medical examination without abnormal findings: Secondary | ICD-10-CM | POA: Diagnosis not present

## 2015-10-23 DIAGNOSIS — F411 Generalized anxiety disorder: Secondary | ICD-10-CM

## 2015-10-23 DIAGNOSIS — F909 Attention-deficit hyperactivity disorder, unspecified type: Secondary | ICD-10-CM

## 2015-10-23 DIAGNOSIS — F410 Panic disorder [episodic paroxysmal anxiety] without agoraphobia: Secondary | ICD-10-CM

## 2015-10-23 DIAGNOSIS — R7989 Other specified abnormal findings of blood chemistry: Secondary | ICD-10-CM

## 2015-10-23 DIAGNOSIS — R946 Abnormal results of thyroid function studies: Secondary | ICD-10-CM | POA: Diagnosis not present

## 2015-10-23 DIAGNOSIS — N393 Stress incontinence (female) (male): Secondary | ICD-10-CM

## 2015-10-23 DIAGNOSIS — R718 Other abnormality of red blood cells: Secondary | ICD-10-CM

## 2015-10-23 DIAGNOSIS — R609 Edema, unspecified: Secondary | ICD-10-CM | POA: Diagnosis not present

## 2015-10-23 DIAGNOSIS — F988 Other specified behavioral and emotional disorders with onset usually occurring in childhood and adolescence: Secondary | ICD-10-CM | POA: Insufficient documentation

## 2015-10-23 MED ORDER — AMPHETAMINE-DEXTROAMPHET ER 30 MG PO CP24: 30.0000 mg | ORAL_CAPSULE | ORAL | Status: DC

## 2015-10-23 MED ORDER — TOLTERODINE TARTRATE ER 2 MG PO CP24: 2.0000 mg | ORAL_CAPSULE | Freq: Two times a day (BID) | ORAL | Status: DC

## 2015-10-23 MED ORDER — TRIAMTERENE-HCTZ 75-50 MG PO TABS
ORAL_TABLET | ORAL | Status: DC
Start: 2015-10-23 — End: 2018-04-23

## 2015-10-23 MED ORDER — LORAZEPAM 2 MG PO TABS: ORAL_TABLET | ORAL | Status: DC

## 2015-10-23 NOTE — Progress Notes (Addendum)
Subjective:    Patient ID: Rebecca [name], female    DOB: 1970/12/15, 45 y.o.   MRN: [id number]  HPI  45 year old Female for health maintenance and evaluation of medical issues.  Patient referred by Dr. Joya Martyr. Had stress test and Echo 5 years ago at [facility] because of episode of chest pain. 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 Hx LE edema related to sitting and hot weather. Has taken Maxide 25 in the past. Generally takes a couple days a week. Sits for long periods of time but does have a standing desk. Hx ADD takes Adderall sparingly whenever she has a lot of reading to do at her work. Doesn't take it every day and doesn't take it every week. Has never been tested for ADD. 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 Sometimes takes lorazepam 2 mg if she feels extremely anxious. Doesn't do this often.  Past medical history: C-section 2007, C-section 2010, tubal ligation 2013. Abdominoplasty by Dr. Luvenia Heller 2012.  Social history: She was in the [employer] for 14.5  years. 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 Does not smoke. Social alcohol consumption a couple times a week. 2 children-- a boy age 10 and a girl age 50  Family history: Father died of lymphoma at age 5. Mother died at age 2 of nonalcoholic cirrhosis of the liver, pneumonia and septicemia. She also had chronic pancreatitis and had diabetes. One brother age 49 with diabetes. No sisters.  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 She also underwent PT for that. Exercise is limited. Walks a couple of times a week and does some yoga.  GYN physician has her  on oral contraceptives. She used to have menorrhagia but this improved on oral contraceptives. She has regular menses. She is fatigued a lot. She is worried about thyroid disorder. Says she's not able to lose weight.     Objective:   Physical Exam  Constitutional: She is oriented to person, place, and time. She appears well-developed and well-nourished. No distress.  HENT:  Head: Normocephalic and atraumatic.  Right Ear: External ear normal.  Left Ear: External ear normal.  Mouth/Throat: Oropharynx is clear and moist. No oropharyngeal exudate.  Eyes: Conjunctivae and EOM are normal. Pupils are equal, round, and reactive to light. Right eye exhibits no discharge. Left eye exhibits no discharge. No scleral icterus.  Neck: Neck supple. No JVD present. No thyromegaly present.  Cardiovascular: Normal rate, regular rhythm, normal heart sounds and intact distal pulses.   No murmur heard. Pulmonary/Chest: Effort normal and breath sounds normal. She has no wheezes. She has no rales.  Breasts normal female  Abdominal: Soft. Bowel sounds are normal. She exhibits no distension and no mass. There is no tenderness. There is no rebound and no guarding.  She has slight bulge right lower quadrant which she thinks is a hernia. Says she's had a CT scan in the past that did not reveal a definite hernia. She has no significant pain. Healed Pfannenstiel incision where she had abdominoplasty.  Genitourinary:  Deferred to GYN  Musculoskeletal: She exhibits no  edema.  Lymphadenopathy:    She has no cervical adenopathy.  Neurological: She is alert and oriented to person, place, and time. She has normal reflexes. She displays normal reflexes. No cranial nerve deficit. She exhibits normal muscle tone. Coordination normal.  Skin: Skin is warm and dry. No rash noted. She is not diaphoretic.  Psychiatric: She has a normal mood and affect. Her behavior is normal. Thought content normal.  Vitals reviewed.           Assessment & Plan:  Anxiety-prescription for lorazepam 2 mg to take at onset of acute episode of anxiety and to take very sparingly.  Attention deficit disorder-patient previously took Adderall XR 30 mg daily on a rare occasion when she had a lot of reading today. Was given 30 tablets.  Obesity-encouraged diet exercise and weight loss  Low MCV-check for iron deficiency with history of menorrhagia that has improved being on oral contraceptives  Dependent edema-treat with Maxide 75/50 daily. Discontinue Lasix.  Low TSH-check  free T4.   Low TSH could be an error. May need to be repeated in 3 months. Patient has never been told she had a low TSH in the past. Consider secondary hypothyroidism if this indeed is a true value.  Stress urinary incontinence-Detrol LA twice daily  Plan: Review free T4 and make further recommendations. Prescriptions noted above.

## 2015-10-23 NOTE — Patient Instructions (Addendum)
Take Adderall XR sparing for ADD. Take Lorazapam 2 mg prn. Take Maxzide 75/50 prn edema. Diet and exercise recommended. Free T4 being checked due to low TSH.

## 2015-10-24 LAB — IRON AND TIBC
%SAT: 16 % (ref 11–50)
Iron: 77 ug/dL (ref 40–190)
TIBC: 496 ug/dL — ABNORMAL HIGH (ref 250–450)
UIBC: 419 ug/dL — ABNORMAL HIGH (ref 125–400)

## 2015-10-24 LAB — T4, FREE: FREE T4: 1.8 ng/dL (ref 0.8–1.8)

## 2015-12-01 ENCOUNTER — Telehealth: Payer: Self-pay | Admitting: Internal Medicine

## 2015-12-01 NOTE — Telephone Encounter (Signed)
Pt was seen in the ER in [place] yesterday and states she was told she had diverticulitis. Pt states she was given an antibiotic. Discussed with pt that she needs to take the meds they gave her, clear liquid diet for a few days, tylenol for pain and if she does not get better or symptoms get worse she would need to go back to the ER. Pt is scheduled to see Dr. Hilarie Fredrickson in October and she knows to keep that appt.

## 2015-12-11 ENCOUNTER — Other Ambulatory Visit: Payer: Self-pay | Admitting: Obstetrics and Gynecology

## 2015-12-12 ENCOUNTER — Telehealth: Payer: Self-pay | Admitting: Internal Medicine

## 2015-12-12 LAB — CYTOLOGY - PAP

## 2015-12-12 NOTE — Telephone Encounter (Signed)
Patient was seen here for the first time in June and was found to have low TSH. She recently apparently saw her GYN physician Dr. Ouida Sills who repeated her TSH and it was once again low at -0.01. I am concerned about this. Says she's never been told this before and she will likely need endocrinology referral.  I have left her message today to contact the office regarding follow-up.  We had originally schedule her to return here September 18 for 3 month follow-up on this.

## 2016-01-15 ENCOUNTER — Ambulatory Visit: Payer: Self-pay | Admitting: Internal Medicine

## 2016-01-15 ENCOUNTER — Ambulatory Visit: Payer: BLUE CROSS/BLUE SHIELD | Admitting: Internal Medicine

## 2016-01-22 ENCOUNTER — Ambulatory Visit: Payer: Self-pay | Admitting: Internal Medicine

## 2016-01-22 ENCOUNTER — Encounter: Payer: Self-pay | Admitting: *Deleted

## 2016-01-30 ENCOUNTER — Encounter: Payer: Self-pay | Admitting: *Deleted

## 2016-02-14 ENCOUNTER — Encounter: Payer: Self-pay | Admitting: Internal Medicine

## 2016-02-14 ENCOUNTER — Encounter (INDEPENDENT_AMBULATORY_CARE_PROVIDER_SITE_OTHER): Payer: Self-pay

## 2016-02-14 ENCOUNTER — Ambulatory Visit (INDEPENDENT_AMBULATORY_CARE_PROVIDER_SITE_OTHER): Payer: BLUE CROSS/BLUE SHIELD | Admitting: Internal Medicine

## 2016-02-14 VITALS — BP 136/80 | HR 88 | Ht 70.0 in | Wt 210.0 lb

## 2016-02-14 DIAGNOSIS — K625 Hemorrhage of anus and rectum: Secondary | ICD-10-CM

## 2016-02-14 DIAGNOSIS — Z8719 Personal history of other diseases of the digestive system: Secondary | ICD-10-CM | POA: Diagnosis not present

## 2016-02-14 DIAGNOSIS — K219 Gastro-esophageal reflux disease without esophagitis: Secondary | ICD-10-CM | POA: Diagnosis not present

## 2016-02-14 MED ORDER — NA SULFATE-K SULFATE-MG SULF 17.5-3.13-1.6 GM/177ML PO SOLN: ORAL | 0 refills | Status: DC

## 2016-02-14 NOTE — Progress Notes (Signed)
Patient ID: Rebecca [name], female   DOB: 1970-09-09, 45 y.o.   MRN: [id number] HPI: Rebecca [name] is a 45 year old female with a past medical history of GERD, migraines and recent diverticulitis who seen in consultation at the request of Dr.  Shelia Media to evaluate her history of diverticulitis and now rectal bleeding. She is here alone today. She reports in August she developed mid and lower abdominal pain which slowly worsened. She went to the ER and was diagnosed with diverticulitis of the colon by CT scan. She was treated with Augmentin for 10 days and symptoms improved. Most recent her bowel movements have been normal having one bowel movement per day where previously she would miss several days between bowel movements. For 4 days about 10 days ago she had red blood on the stool and with wiping. She reports her stools is forming brown without melena. This bleeding was painless. She had not seen prior bleeding. Her abdominal pain has all resolved. She does have issues with heartburn and was preceded treated with prescription Nexium. She's not using over-the-counter Nexium 20 mg per day. This controls her heartburn very well. She denies breakthrough symptoms. She denies dysphagia, odynophagia, nausea and vomiting.  Family history notable for her mother who had cryptogenic cirrhosis and died from this in 2014-04-02. She reports her mother was never a drinker but also had chronic pancreatitis and cirrhosis. No other known family history of cirrhosis and no family history of IBD or GI tract malignancy  She is a past smoker drink alcohol about once per week. She has 2 kids and works at [employer]  Past Medical History:  Diagnosis Date  . Anxiety   . Diverticulitis   . GERD (gastroesophageal reflux disease)   . Hiatal hernia   . Migraines     Past Surgical History:  Procedure Laterality Date  . CESAREAN SECTION  2007/2010  . LAPAROSCOPIC TUBAL LIGATION  06/12/2011   Procedure:  LAPAROSCOPIC TUBAL LIGATION;  Surgeon: Daria Pastures, MD;  Location: [facility];  Service: Gynecology;  Laterality: N/A;  filshie clip  . mini tuck  2012  . right ear surg  03/2008   cancerous lesion removed  . US ECHOCARDIOGRAPHY  12-30-2007   EF 55-60%  . WISDOM TOOTH EXTRACTION      Outpatient Medications Prior to Visit  Medication Sig Dispense Refill  . Amphetamine-Dextroamphetamine (ADDERALL PO) Take by mouth.    Marland Kitchen LORazepam (ATIVAN) 1 MG tablet Take 1 tablet (1 mg total) by mouth every 6 (six) hours as needed for anxiety. 12 tablet 0  . Diclofenac Sodium CR (VOLTAREN-XR) 100 MG 24 hr tablet Take 1 tablet (100 mg total) by mouth daily. 10 tablet 0  . chlorpheniramine-HYDROcodone (TUSSIONEX PENNKINETIC ER) 10-8 MG/5ML SUER Take 5 mLs by mouth every 6 (six) hours as needed for cough. 140 mL 0  . fluconazole (DIFLUCAN) 150 MG tablet Take 1 tablet (150 mg total) by mouth once. 1 tablet 0   No facility-administered medications prior to visit.     No Known Allergies  Family History  Problem Relation Age of Onset  . Lymphoma Father   . Cancer Father     lymphoma  . Diabetes Brother   . Stomach cancer Neg Hx   . Colon cancer Neg Hx     Social History  Substance Use Topics  . Smoking status: Former Smoker    Packs/day: 0.25    Years: 6.00    Types: Cigarettes    Quit date:  03/29/2005  . Smokeless tobacco: Never Used  . Alcohol use Yes     Comment: socially    ROS: As per history of present illness, otherwise negative  BP 136/80   Pulse 88   Ht 5\' 10"  (1.778 m)   Wt 210 lb (95.3 kg) Comment: verbal  BMI 30.13 kg/m  Constitutional: Well-developed and well-nourished. No distress. HEENT: Normocephalic and atraumatic. Oropharynx is clear and moist. No oropharyngeal exudate. Conjunctivae are normal.  No scleral icterus. Neck: Neck supple. Trachea midline. Cardiovascular: Normal rate, regular rhythm and intact distal pulses. No M/R/G Pulmonary/chest: Effort normal and  breath sounds normal. No wheezing, rales or rhonchi. Abdominal: Soft, nontender, nondistended. Bowel sounds active throughout. There are no masses palpable. No hepatosplenomegaly. Extremities: no clubbing, cyanosis, or edema Lymphadenopathy: No cervical adenopathy noted. Neurological: Alert and oriented to person place and time. Skin: Skin is warm and dry. No rashes noted. Psychiatric: Normal mood and affect. Behavior is normal.  RELEVANT LABS AND IMAGING: Records from her emergency room visit reviewed hemoglobin was 12.0, white count 10.8, platelet count 300, liver enzymes normal (T bili 0.2, albumin 3.8, ALT 12, AST 14, alkaline phosphatase 49), INR 0.9  CT scan of the abdomen and pelvis with contrast showed diverticulosis with focal inflammatory change in the mid segment of sigmoid colon consistent with acute diverticulitis, no surrounding drainable abscess. No obstruction.  ASSESSMENT/PLAN: 45 year old female with a past medical history of GERD, migraines and recent diverticulitis who seen in consultation at the request of Dr.  Shelia Media to evaluate her history of diverticulitis and now rectal bleeding.  1. Colonic diverticulitis, now resolved and recent rectal bleeding -- I recommended colonoscopy to evaluate her history of diverticulitis and also rectal bleeding. We discussed the risks, benefits and alternatives and she wishes to proceed. No signs or symptoms of diverticulitis now and this episode was uncomplicated resolving with Augmentin  2. GERD -- well-controlled without alarm symptoms with over-the-counter Nexium 20 mg daily. She plans to continue this medication for now.  3. Family history of cirrhosis -- cryptogenic. The patient's liver enzymes recently normal as was cross-sectional imaging which is reassuring. No evidence for advanced liver disease or cirrhosis in this patient at this time.      [id number] Pharr, [address] [place], [place] [id number]

## 2016-02-14 NOTE — Patient Instructions (Signed)
You have been scheduled for a colonoscopy. Please follow written instructions given to you at your visit today.  Please pick up your prep supplies at the pharmacy within the next 1-3 days. If you use inhalers (even only as needed), please bring them with you on the day of your procedure. Your physician has requested that you go to www.startemmi.com and enter the access code given to you at your visit today. This web site gives a general overview about your procedure. However, you should still follow specific instructions given to you by our office regarding your preparation for the procedure.  If you are age 33 or older, your body mass index should be between 23-30. Your Body mass index is 30.13 kg/m. If this is out of the aforementioned range listed, please consider follow up with your Primary Care Provider.  If you are age 62 or younger, your body mass index should be between 19-25. Your Body mass index is 30.13 kg/m. If this is out of the aformentioned range listed, please consider follow up with your Primary Care Provider.

## 2016-02-23 ENCOUNTER — Encounter: Payer: Self-pay | Admitting: Internal Medicine

## 2016-03-07 ENCOUNTER — Encounter: Payer: Self-pay | Admitting: Internal Medicine

## 2016-03-07 ENCOUNTER — Ambulatory Visit (AMBULATORY_SURGERY_CENTER): Payer: BLUE CROSS/BLUE SHIELD | Admitting: Internal Medicine

## 2016-03-07 VITALS — BP 135/77 | HR 83 | Temp 98.4°F | Resp 15 | Ht 70.0 in | Wt 210.0 lb

## 2016-03-07 DIAGNOSIS — K635 Polyp of colon: Secondary | ICD-10-CM | POA: Diagnosis not present

## 2016-03-07 DIAGNOSIS — D129 Benign neoplasm of anus and anal canal: Secondary | ICD-10-CM

## 2016-03-07 DIAGNOSIS — K621 Rectal polyp: Secondary | ICD-10-CM | POA: Diagnosis not present

## 2016-03-07 DIAGNOSIS — Z8719 Personal history of other diseases of the digestive system: Secondary | ICD-10-CM

## 2016-03-07 DIAGNOSIS — K921 Melena: Secondary | ICD-10-CM | POA: Diagnosis not present

## 2016-03-07 DIAGNOSIS — D128 Benign neoplasm of rectum: Secondary | ICD-10-CM

## 2016-03-07 MED ORDER — SODIUM CHLORIDE 0.9 % IV SOLN: 500.0000 mL | INTRAVENOUS | Status: DC

## 2016-03-07 NOTE — Progress Notes (Signed)
Called to room to assist during endoscopic procedure.  Patient ID and intended procedure confirmed with present staff. Received instructions for my participation in the procedure from the performing physician.  

## 2016-03-07 NOTE — Patient Instructions (Signed)
Discharge instructions given. Handouts on polyps and hemorrhoids. Resume previous medications. YOU HAD AN ENDOSCOPIC PROCEDURE TODAY AT THE [facility]:   Refer to the procedure report that was given to you for any specific questions about what was found during the examination.  If the procedure report does not answer your questions, please call your gastroenterologist to clarify.  If you requested that your care partner not be given the details of your procedure findings, then the procedure report has been included in a sealed envelope for you to review at your convenience later.  YOU SHOULD EXPECT: Some feelings of bloating in the abdomen. Passage of more gas than usual.  Walking can help get rid of the air that was put into your GI tract during the procedure and reduce the bloating. If you had a lower endoscopy (such as a colonoscopy or flexible sigmoidoscopy) you may notice spotting of blood in your stool or on the toilet paper. If you underwent a bowel prep for your procedure, you may not have a normal bowel movement for a few days.  Please Note:  You might notice some irritation and congestion in your nose or some drainage.  This is from the oxygen used during your procedure.  There is no need for concern and it should clear up in a day or so.  SYMPTOMS TO REPORT IMMEDIATELY:   Following lower endoscopy (colonoscopy or flexible sigmoidoscopy):  Excessive amounts of blood in the stool  Significant tenderness or worsening of abdominal pains  Swelling of the abdomen that is new, acute  Fever of 100F or higher   For urgent or emergent issues, a gastroenterologist can be reached at any hour by calling [phone].   DIET:  We do recommend a small meal at first, but then you may proceed to your regular diet.  Drink plenty of fluids but you should avoid alcoholic beverages for 24 hours.  ACTIVITY:  You should plan to take it easy for the rest of today and you should NOT DRIVE  or use heavy machinery until tomorrow (because of the sedation medicines used during the test).    FOLLOW UP: Our staff will call the number listed on your records the next business day following your procedure to check on you and address any questions or concerns that you may have regarding the information given to you following your procedure. If we do not reach you, we will leave a message.  However, if you are feeling well and you are not experiencing any problems, there is no need to return our call.  We will assume that you have returned to your regular daily activities without incident.  If any biopsies were taken you will be contacted by phone or by letter within the next 1-3 weeks.  Please call us at [phone] if you have not heard about the biopsies in 3 weeks.    SIGNATURES/CONFIDENTIALITY: You and/or your care partner have signed paperwork which will be entered into your electronic medical record.  These signatures attest to the fact that that the information above on your After Visit Summary has been reviewed and is understood.  Full responsibility of the confidentiality of this discharge information lies with you and/or your care-partner. 

## 2016-03-07 NOTE — Op Note (Signed)
[facility] Patient Name: Rebecca [name] Procedure Date: 03/07/2016 3:10 PM MRN: [id number] Endoscopist: Jerene Bears , MD Age: 45 Referring MD:  Date of Birth: May 27, 1970 Gender: Female Account #: [id number] Procedure:                Colonoscopy Indications:              Rectal bleeding, Follow-up of diverticulitis Medicines:                Monitored Anesthesia Care Procedure:                Pre-Anesthesia Assessment:                           - Prior to the procedure, a History and Physical                            was performed, and patient medications and                            allergies were reviewed. The patient's tolerance of                            previous anesthesia was also reviewed. The risks                            and benefits of the procedure and the sedation                            options and risks were discussed with the patient.                            All questions were answered, and informed consent                            was obtained. Prior Anticoagulants: The patient has                            taken no previous anticoagulant or antiplatelet                            agents. ASA Grade Assessment: II - A patient with                            mild systemic disease. After reviewing the risks                            and benefits, the patient was deemed in                            satisfactory condition to undergo the procedure.                           After obtaining informed consent, the colonoscope  was passed under direct vision. Throughout the                            procedure, the patient's blood pressure, pulse, and                            oxygen saturations were monitored continuously. The                            Model CF-HQ190L [id number]) scope was introduced                            through the anus and advanced to the the cecum,                            identified by  appendiceal orifice and ileocecal                            valve. The colonoscopy was performed without                            difficulty. The patient tolerated the procedure                            well. The quality of the bowel preparation was                            good. The ileocecal valve, appendiceal orifice, and                            rectum were photographed. Scope In: 3:19:26 PM Scope Out: 3:33:49 PM Scope Withdrawal Time: 0 hours 10 minutes 6 seconds  Total Procedure Duration: 0 hours 14 minutes 23 seconds  Findings:                 The digital rectal exam was normal.                           Multiple small and large-mouthed diverticula were                            found in the sigmoid colon and descending colon.                           A 5 mm polyp was found in the rectum. The polyp was                            sessile. The polyp was removed with a cold snare.                            Resection and retrieval were complete.                           Internal hemorrhoids were found during  retroflexion. The hemorrhoids were small.                           The exam was otherwise without abnormality. Complications:            No immediate complications. Estimated Blood Loss:     Estimated blood loss was minimal. Impression:               - Moderate diverticulosis in the sigmoid colon and                            in the descending colon.                           - One 5 mm polyp in the rectum, removed with a cold                            snare. Resected and retrieved.                           - Internal hemorrhoids.                           - The examination was otherwise normal. Recommendation:           - Patient has a contact number available for                            emergencies. The signs and symptoms of potential                            delayed complications were discussed with the                             patient. Return to normal activities tomorrow.                            Written discharge instructions were provided to the                            patient.                           - Resume previous diet.                           - Continue present medications.                           - Await pathology results.                           - Repeat colonoscopy is recommended. The                            colonoscopy date will be determined after pathology  results from today's exam become available for                            review. Jerene Bears, MD 03/07/2016 3:37:18 PM This report has been signed electronically.

## 2016-03-07 NOTE — Progress Notes (Signed)
A and O x3. Report to RN. Tolerated MAC anesthesia well. 

## 2016-03-08 ENCOUNTER — Telehealth: Payer: Self-pay

## 2016-03-08 NOTE — Telephone Encounter (Signed)
Left message on answering machine. 

## 2016-03-08 NOTE — Telephone Encounter (Signed)
  Follow up Call-  Call back number 03/07/2016  Post procedure Call Back phone  # [phone]  Permission to leave phone message Yes  Some recent data might be hidden    Patient was called for follow up after her procedure on 03/07/2016. No answer at the number given for follow up phone call. A message was left on the answering machine.

## 2016-03-14 ENCOUNTER — Encounter: Payer: Self-pay | Admitting: Internal Medicine

## 2016-04-26 ENCOUNTER — Telehealth: Payer: Self-pay | Admitting: Internal Medicine

## 2016-04-26 NOTE — Telephone Encounter (Signed)
Patient called stating that she's had the "crud" for the past week, but has recently noticed some issues with her vision. Patient states that she's had blurred vision, double vision and a "coat of jelly" on her eye. She also stated that she "almost had an episode of vertigo." Per Elizabeth Palau, CMA, patient was advised to go to an Urgent Care to be evaluated. Patient verbalized understanding.

## 2016-05-10 ENCOUNTER — Encounter: Payer: Self-pay | Admitting: Internal Medicine

## 2016-06-18 ENCOUNTER — Other Ambulatory Visit: Payer: Self-pay | Admitting: Internal Medicine

## 2016-06-19 ENCOUNTER — Other Ambulatory Visit: Payer: Self-pay | Admitting: Internal Medicine

## 2016-06-19 DIAGNOSIS — I447 Left bundle-branch block, unspecified: Secondary | ICD-10-CM

## 2016-06-21 ENCOUNTER — Ambulatory Visit
Admission: RE | Admit: 2016-06-21 | Discharge: 2016-06-21 | Disposition: A | Discharge status: Home / Self Care | Payer: No Typology Code available for payment source | Source: Ambulatory Visit | Attending: Internal Medicine | Admitting: Internal Medicine

## 2016-06-21 DIAGNOSIS — I447 Left bundle-branch block, unspecified: Secondary | ICD-10-CM

## 2016-06-21 IMAGING — CT CT HEART SCORING
2 of 3 series · 13 of 20 positions shown, 16 images · non-contrast
Comparison: None.

CLINICAL DATA: 45-year-old female with history of left bundle
branch block and chest pain for the past 2 months.

EXAM:
CT HEART FOR CALCIUM SCORING
TECHNIQUE: CT heart was performed on a 64 channel system using prospective ECG
gating.
A non-contrast exam for calcium scoring was performed.
Note that this exam targets the heart and the chest was not imaged
in its entirety.

[Series 2: smartscore - gated 0.4 sec · axial · 0.49mm/px · z∈[-209,-129]mm · 3 of 64 slices shown]
[im 16/64  vessel]
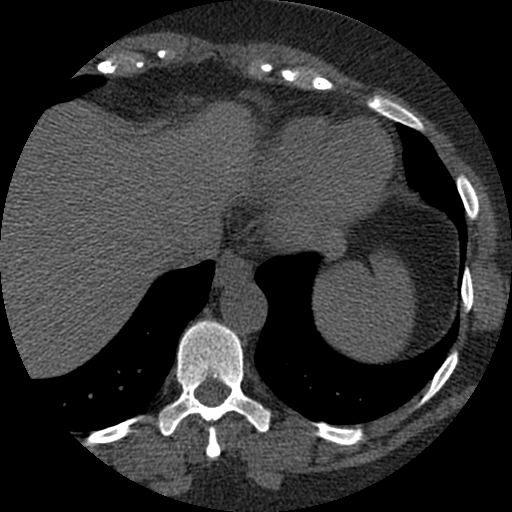
[im 32/64  vessel]
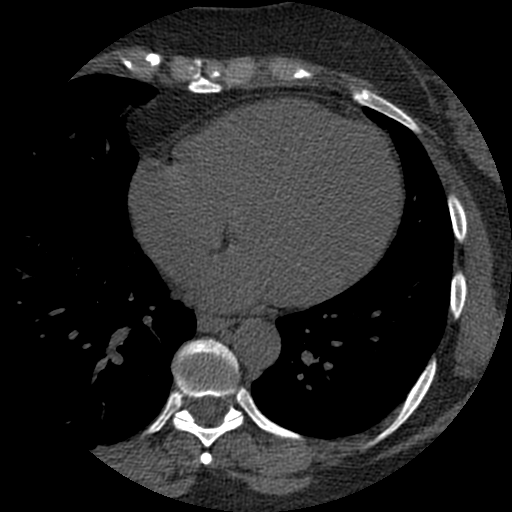
[im 48/64  vessel]
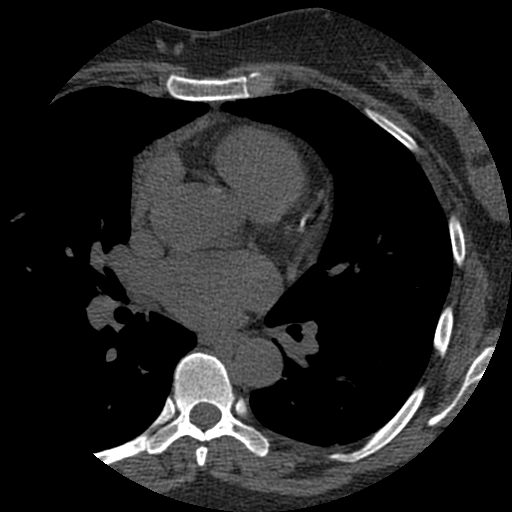

[Series 501: sagittal · sagittal · 0.70mm/px · 10 of 136 slices shown, 13 images]
[im 13/136  vessel]
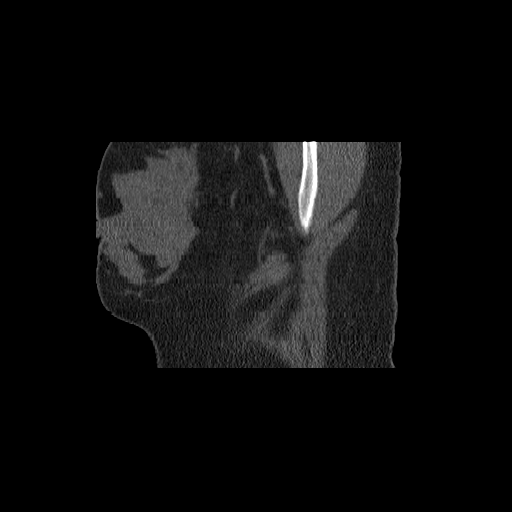
[im 13/136  lung]
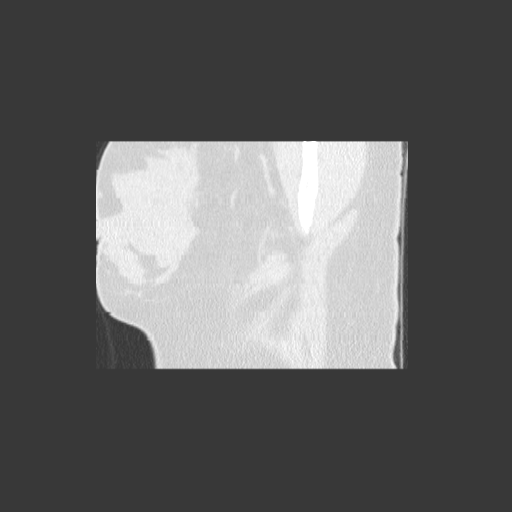
[im 25/136  vessel]
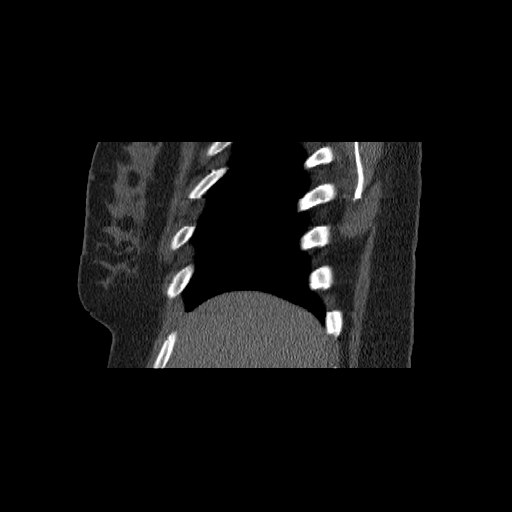
[im 37/136  vessel]
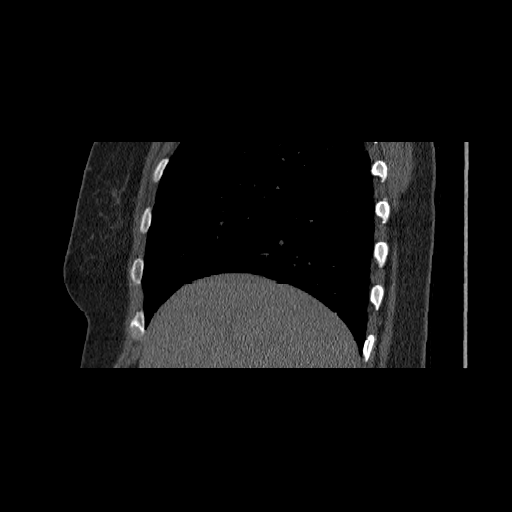
[im 50/136  vessel]
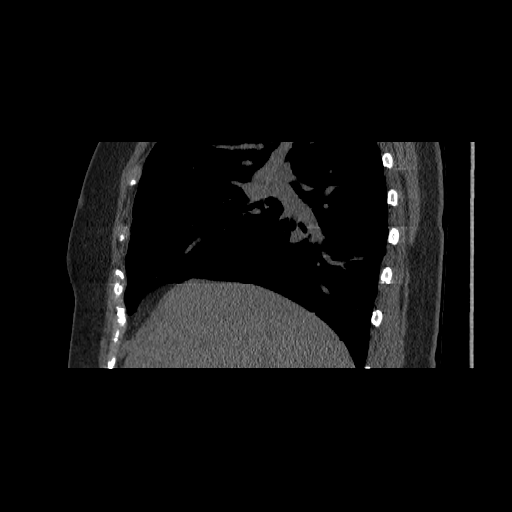
[im 62/136  vessel]
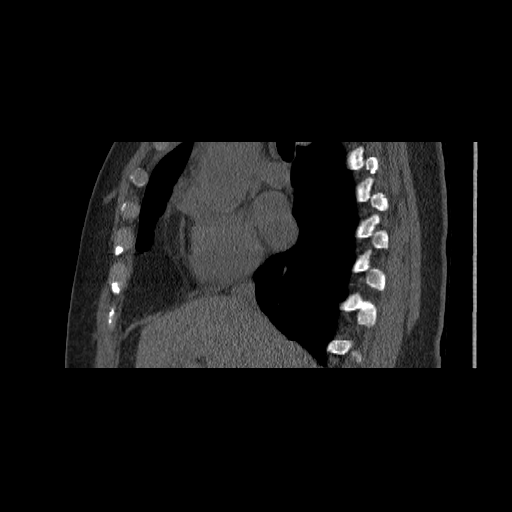
[im 62/136  lung]
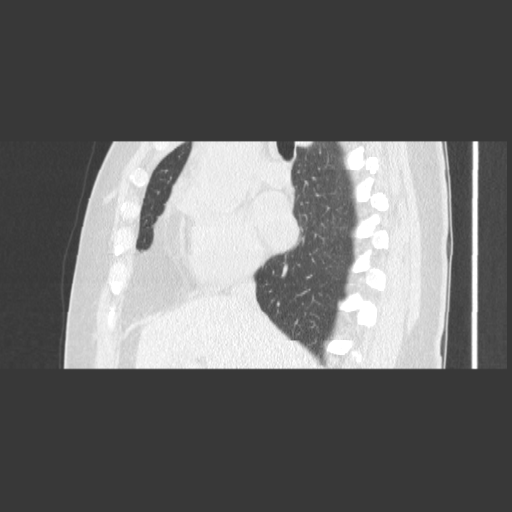
[im 74/136  vessel]
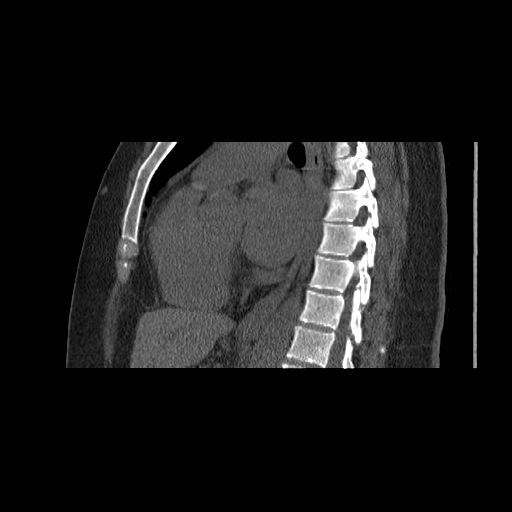
[im 86/136  vessel]
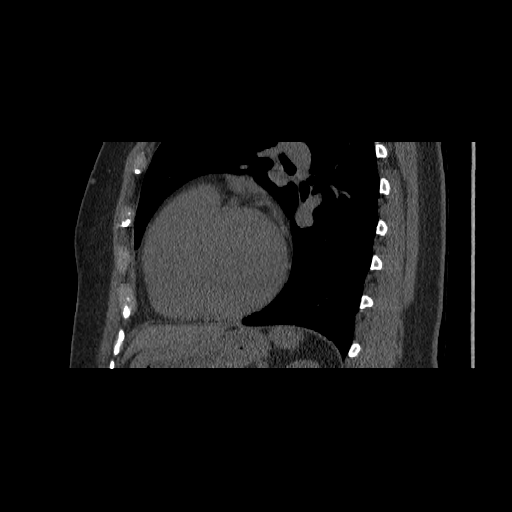
[im 99/136  vessel]
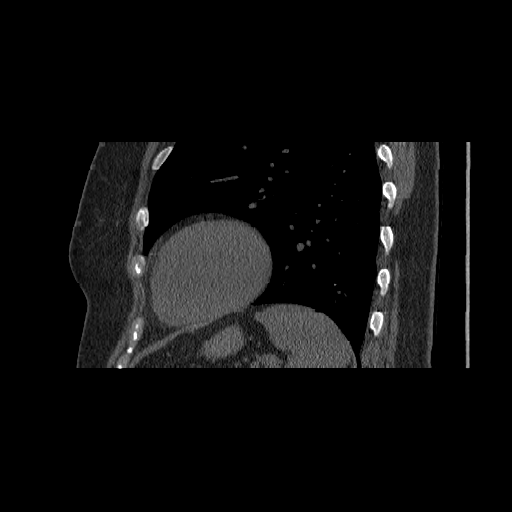
[im 111/136  vessel]
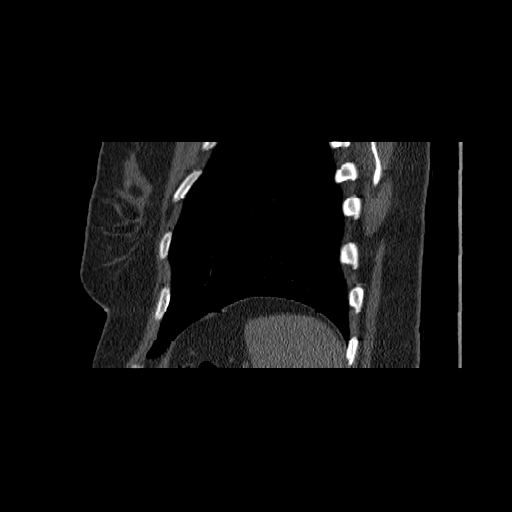
[im 111/136  lung]
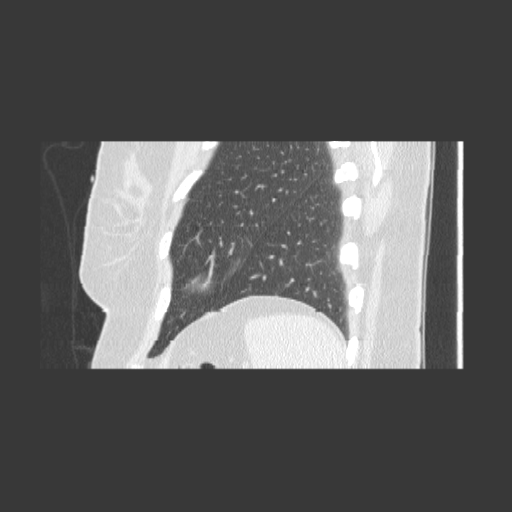
[im 123/136  vessel]
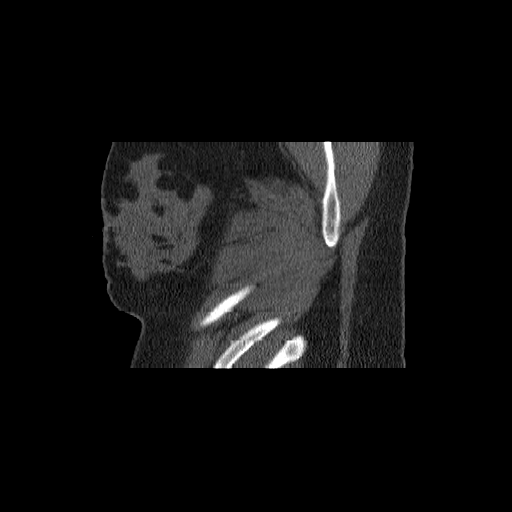

[13 of 20 positions shown; findings below may reference images not displayed]

FINDINGS: Technical quality: Good.

CORONARY CALCIUM

Total Agatston Score: 56

[HOSPITAL] percentile:  99th

OTHER FINDINGS:

2 mm subpleural nodule in the periphery of the right upper lobe
(image 7 of series 3) is nonspecific, but statistically likely a
benign subpleural lymph node. Within the visualized portions of the
thorax there are no larger more suspicious appearing pulmonary
nodules or masses, there is no acute consolidative airspace disease,
no pleural effusion, no pneumothorax and no lymphadenopathy.
Visualized portions of the upper abdomen are unremarkable. There are
no aggressive appearing lytic or blastic lesions noted in the
visualized portions of the skeleton.
IMPRESSION: 1. Patient's total coronary artery calcium score is 56 which is 99th
percentile for patient's of matched age, gender and race/ethnicity.
2. 2 mm right upper lobe pulmonary nodule is nonspecific, but is
statistically likely a benign subpleural lymph node. No follow-up
needed if patient is low-risk. Non-contrast chest CT can be
considered in 12 months if patient is high-risk. This recommendation
follows the consensus statement: Guidelines for Management of
Incidental Pulmonary Nodules Detected on CT Images: From the

## 2016-06-24 ENCOUNTER — Other Ambulatory Visit (HOSPITAL_COMMUNITY): Payer: Self-pay | Admitting: Endocrinology

## 2016-06-24 DIAGNOSIS — E0590 Thyrotoxicosis, unspecified without thyrotoxic crisis or storm: Secondary | ICD-10-CM

## 2016-06-27 ENCOUNTER — Encounter (HOSPITAL_COMMUNITY)
Admission: RE | Admit: 2016-06-27 | Discharge: 2016-06-27 | Disposition: A | Discharge status: Home / Self Care | Payer: No Typology Code available for payment source | Source: Ambulatory Visit | Attending: Endocrinology | Admitting: Endocrinology

## 2016-06-27 DIAGNOSIS — E0590 Thyrotoxicosis, unspecified without thyrotoxic crisis or storm: Secondary | ICD-10-CM | POA: Insufficient documentation

## 2016-06-27 MED ORDER — SODIUM IODIDE I 131 CAPSULE
11.1000 | Freq: Once | INTRAVENOUS | Status: AC | PRN
  Administered 2016-06-27: 11.1 via ORAL

## 2016-06-28 ENCOUNTER — Telehealth: Payer: Self-pay | Admitting: Internal Medicine

## 2016-06-28 ENCOUNTER — Encounter (HOSPITAL_COMMUNITY)
Admission: RE | Admit: 2016-06-28 | Discharge: 2016-06-28 | Disposition: A | Discharge status: Home / Self Care | Payer: PRIVATE HEALTH INSURANCE | Source: Ambulatory Visit | Attending: Endocrinology | Admitting: Endocrinology

## 2016-06-28 DIAGNOSIS — E0590 Thyrotoxicosis, unspecified without thyrotoxic crisis or storm: Secondary | ICD-10-CM | POA: Insufficient documentation

## 2016-06-28 MED ORDER — CIPROFLOXACIN HCL 500 MG PO TABS: 500.0000 mg | ORAL_TABLET | Freq: Two times a day (BID) | ORAL | 0 refills | Status: DC

## 2016-06-28 MED ORDER — METRONIDAZOLE 250 MG PO TABS: 250.0000 mg | ORAL_TABLET | Freq: Three times a day (TID) | ORAL | 0 refills | Status: DC

## 2016-06-28 MED ORDER — SODIUM PERTECHNETATE TC 99M INJECTION: 9.8000 | Freq: Once | INTRAVENOUS | Status: DC | PRN

## 2016-06-28 NOTE — Telephone Encounter (Signed)
Pt states for a couple of days she has been having pain in her lower abdomen where she had it last time with diverticulitis. States she has gone to clear liquids and taken some aleve and feels some better but she would like to get a prescription for cipro. States she would come in for an appt today but she has Thyroid uptake today. Please advise.

## 2016-06-28 NOTE — Telephone Encounter (Signed)
Spoke with pt and she is aware. Scripts sent to pharmacy for pt. 

## 2016-06-28 NOTE — Telephone Encounter (Signed)
Previously she was treated with augmentin for diverticulitis, however okay for cipro 500 mg bid x 7 days + flagyl 250 mg TID x 7 days Call if worsening or symptoms fail to resolve completely

## 2016-07-02 ENCOUNTER — Other Ambulatory Visit (HOSPITAL_COMMUNITY): Payer: Self-pay | Admitting: Endocrinology

## 2016-07-02 DIAGNOSIS — E0590 Thyrotoxicosis, unspecified without thyrotoxic crisis or storm: Secondary | ICD-10-CM

## 2016-07-12 ENCOUNTER — Encounter (HOSPITAL_COMMUNITY)
Admission: RE | Admit: 2016-07-12 | Discharge: 2016-07-12 | Disposition: A | Discharge status: Home / Self Care | Payer: PRIVATE HEALTH INSURANCE | Source: Ambulatory Visit | Attending: Endocrinology | Admitting: Endocrinology

## 2016-07-12 DIAGNOSIS — E0590 Thyrotoxicosis, unspecified without thyrotoxic crisis or storm: Secondary | ICD-10-CM | POA: Diagnosis not present

## 2016-07-12 LAB — HCG, SERUM, QUALITATIVE: Preg, Serum: NEGATIVE

## 2016-07-12 IMAGING — NM NM RAI THERAPY FOR HYPERTHYROIDISM
2 series · 2 of 2 positions shown · non-contrast
Comparison: Uptake and scan [DATE]

CLINICAL DATA: Hyperthyroidism depressed TSH equal 0.006.
Temperature intolerance. Tachycardia and tremors.

EXAM:
RADIOACTIVE IODINE THERAPY FOR HYPERTHYROIDISM
TECHNIQUE: Radioactive iodine prescribed by Dr. INDRIVE . The risks and
benefits of radioactive iodine therapy were discussed with the
patient in detail by Dr. INDRIVE. Alternative therapies were also
mentioned. Radiation safety was discussed with the patient,
including how to protect the general public from exposure. There
were no barriers to communication. Written consent was obtained. The
patient then received a capsule containing the radiopharmaceutical.
The patient will follow-up with the referring physician.
RADIOPHARMACEUTICALS:  24.1 mCi [L2] sodium iodide orally

[Series 1: static · 2.07mm/px · 1 of 1 slices shown (1 of 2)]
[im 1/1]
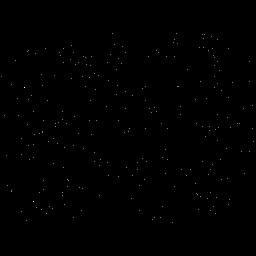

[Series 1: static · 2.07mm/px · 1 of 1 slices shown (2 of 2)]
[im 1/1]
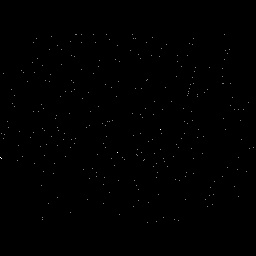

[2 of 2 positions shown; findings below may reference images not displayed]

IMPRESSION: Per oral administration of [L2] sodium iodide for the treatment of
hyperthyroidism.

## 2016-07-12 MED ORDER — SODIUM IODIDE I 131 CAPSULE
24.1000 | Freq: Once | INTRAVENOUS | Status: AC | PRN
  Administered 2016-07-12: 24.1 via ORAL

## 2017-03-26 ENCOUNTER — Other Ambulatory Visit: Payer: Self-pay | Admitting: Internal Medicine

## 2017-03-26 DIAGNOSIS — R911 Solitary pulmonary nodule: Secondary | ICD-10-CM

## 2017-05-07 ENCOUNTER — Other Ambulatory Visit: Payer: No Typology Code available for payment source

## 2017-07-07 ENCOUNTER — Other Ambulatory Visit: Payer: Self-pay | Admitting: Obstetrics and Gynecology

## 2017-07-07 DIAGNOSIS — N63 Unspecified lump in unspecified breast: Secondary | ICD-10-CM

## 2017-07-09 ENCOUNTER — Ambulatory Visit
Admission: RE | Admit: 2017-07-09 | Discharge: 2017-07-09 | Disposition: A | Discharge status: Home / Self Care | Payer: BLUE CROSS/BLUE SHIELD | Source: Ambulatory Visit | Attending: Obstetrics and Gynecology | Admitting: Obstetrics and Gynecology

## 2017-07-09 ENCOUNTER — Other Ambulatory Visit: Payer: Self-pay | Admitting: Obstetrics and Gynecology

## 2017-07-09 ENCOUNTER — Ambulatory Visit
Admission: RE | Admit: 2017-07-09 | Discharge: 2017-07-09 | Disposition: A | Discharge status: Home / Self Care | Payer: No Typology Code available for payment source | Source: Ambulatory Visit | Attending: Obstetrics and Gynecology | Admitting: Obstetrics and Gynecology

## 2017-07-09 DIAGNOSIS — N63 Unspecified lump in unspecified breast: Secondary | ICD-10-CM

## 2017-07-09 DIAGNOSIS — N6310 Unspecified lump in the right breast, unspecified quadrant: Secondary | ICD-10-CM

## 2017-07-09 IMAGING — US ULTRASOUND RIGHT BREAST LIMITED
1 series · 13 of 14 positions shown · non-contrast
Comparison: Previous exam(s).

CLINICAL DATA: 46-year-old female presenting for evaluation of a
palpable lump identified on self breast exam 1 week ago. She states
that this has gotten smaller in size unless firm since she first
noticed.

EXAM:
DIGITAL DIAGNOSTIC UNILATERAL RIGHT MAMMOGRAM WITH CAD AND TOMO
RIGHT BREAST ULTRASOUND

[Series 1: ultrasound right breast limited · 0.09mm/px · 13 of 14 slices shown]
[im 1/14]
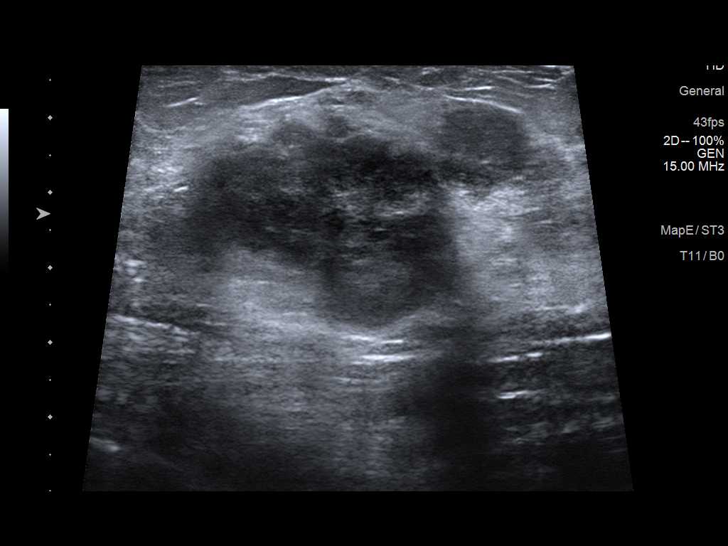
[im 2/14]
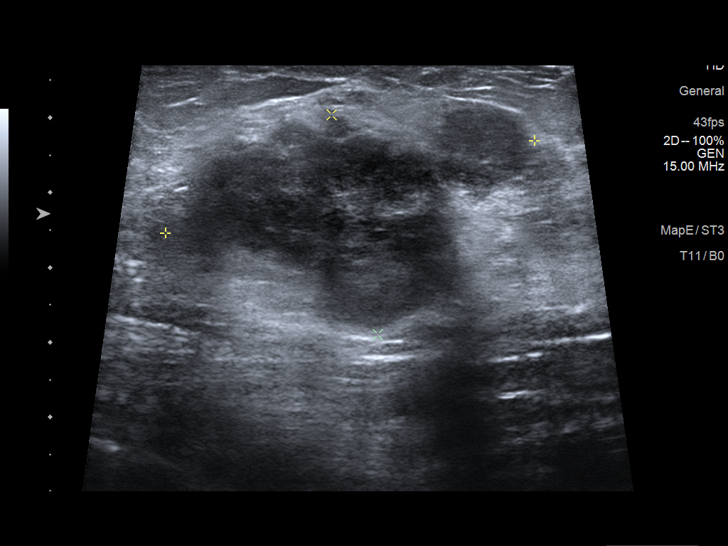
[im 3/14]
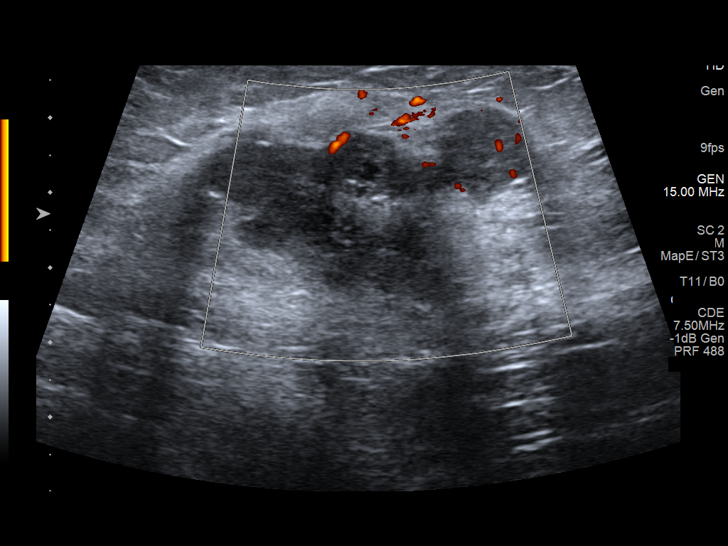
[im 4/14]
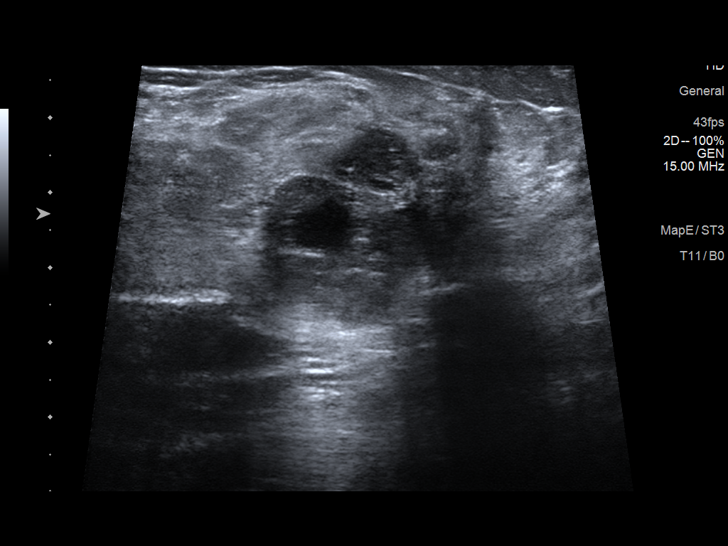
[im 5/14]
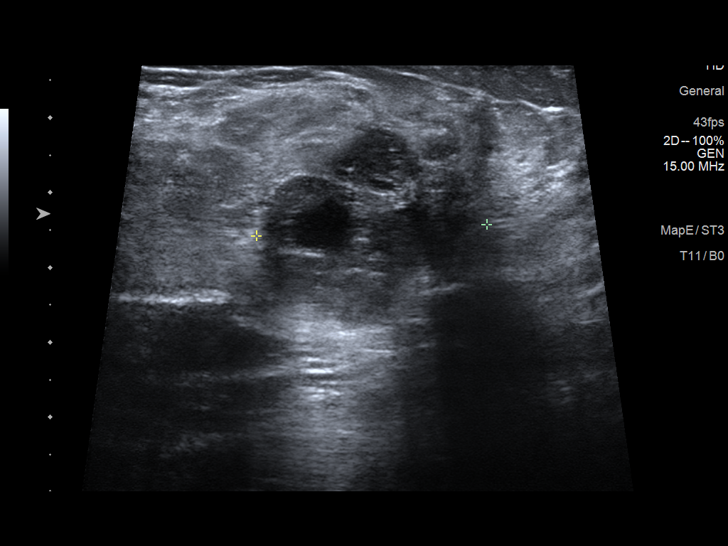
[im 6/14]
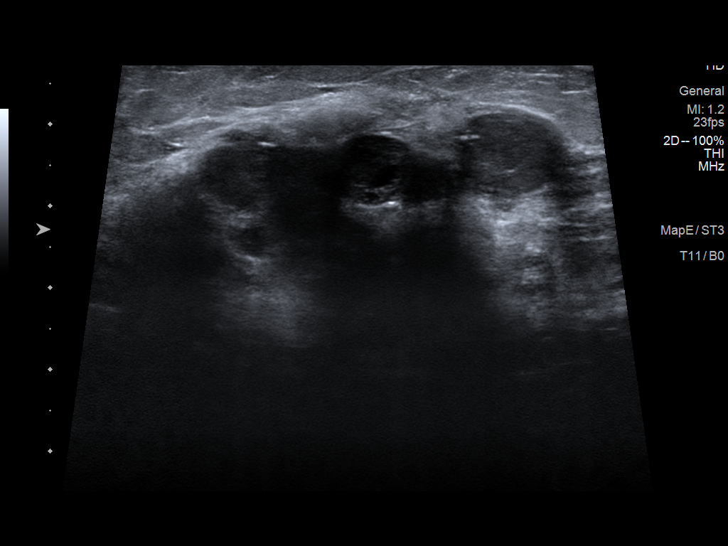
[im 8/14]
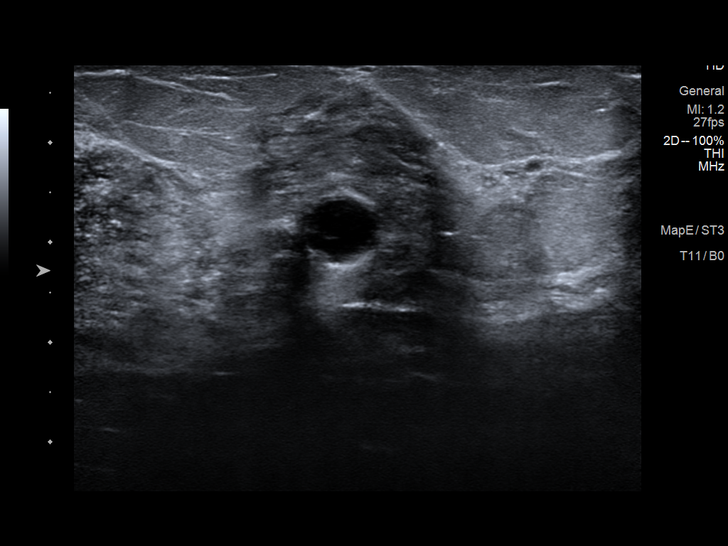
[im 9/14]
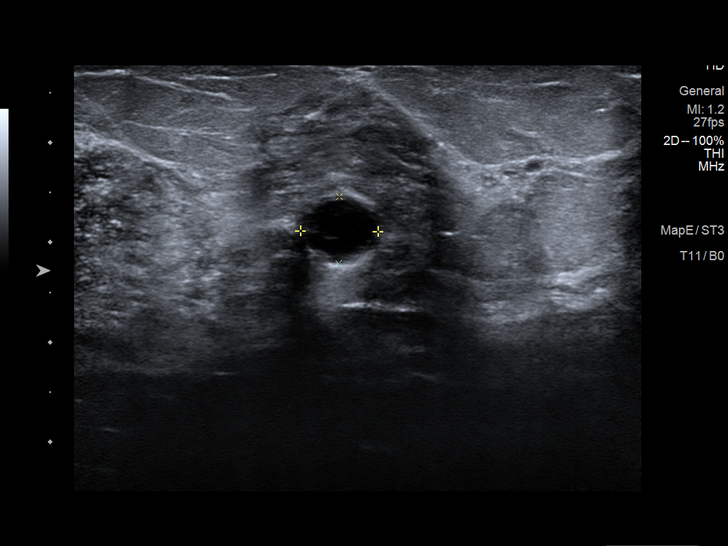
[im 10/14]
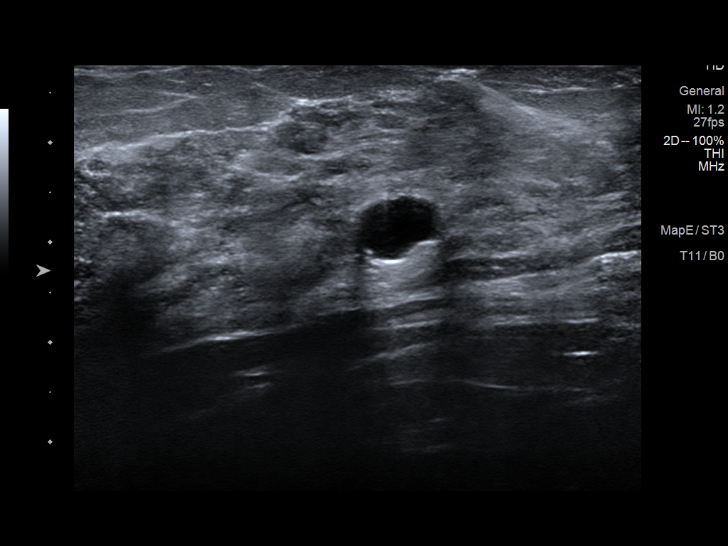
[im 11/14]
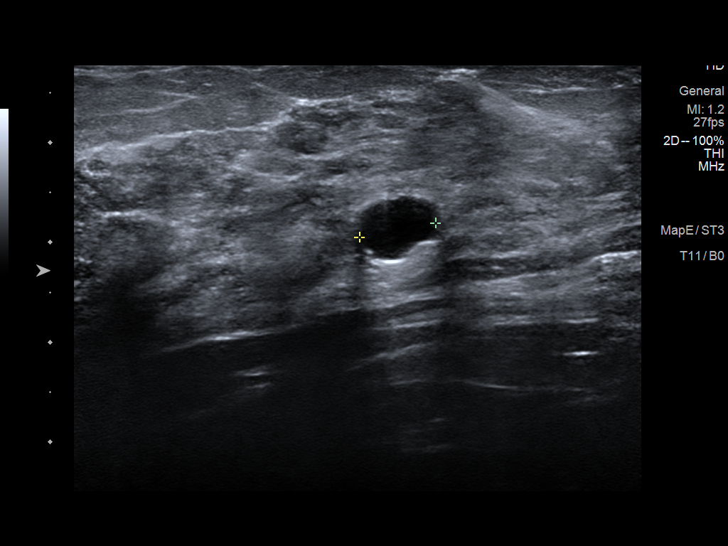
[im 12/14]
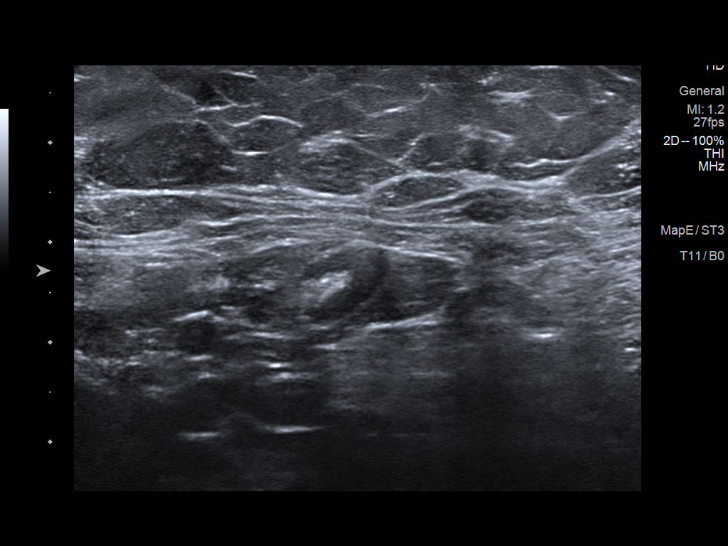
[im 13/14]
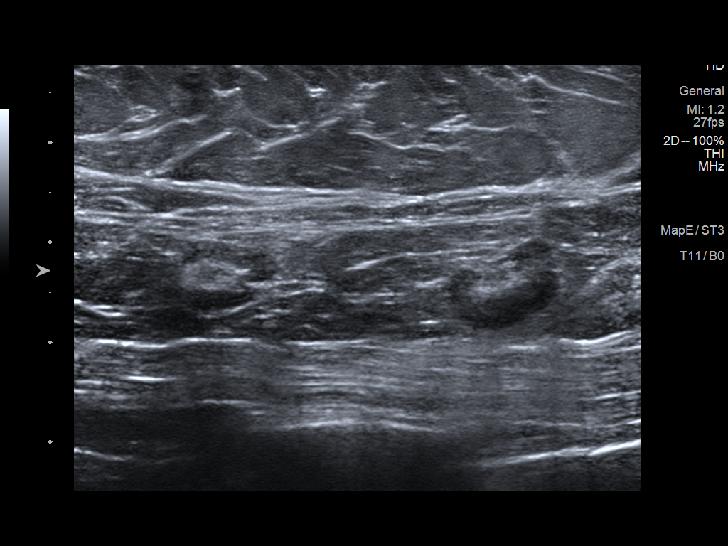
[im 14/14]
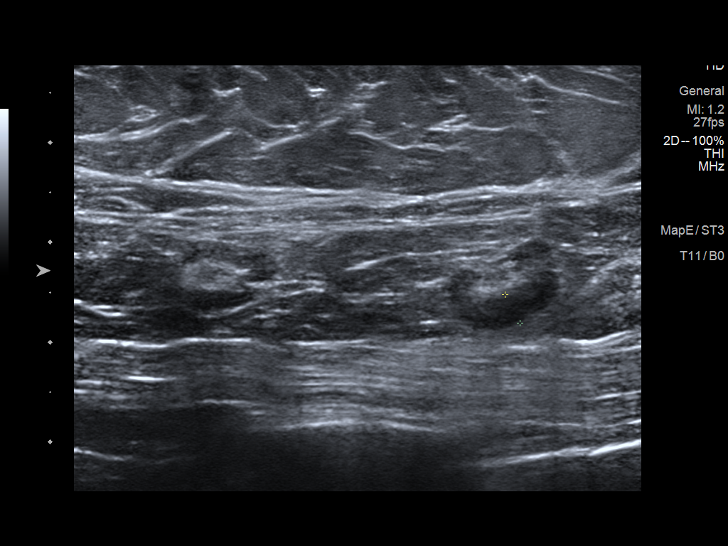

[13 of 14 positions shown; findings below may reference images not displayed]

ACR Breast Density Category d: The breast tissue is extremely dense,
which lowers the sensitivity of mammography.
FINDINGS: A BB indicating the site of palpable concern has been placed on the
upper-outer quadrant of the right breast. There are no definite
suspicious mammographic findings identified deep to the palpable
marker. However, the patient's breast tissue is extremely dense, and
therefore ultrasound will be performed for further evaluation.

Mammographic images were processed with CAD.

Physical exam of the slightly lower outer quadrant of the right
breast demonstrates a firm mass spanning approximately 5 cm.

Ultrasound of the right breast at [DATE], 6 cm from the nipple
demonstrates an irregular hypoechoic mass measuring 5.1 x 3.0 x
cm. Blood flow was seen within the mass on color Doppler imaging.
There is a near anechoic mass at [DATE], 2 cm from the nipple
measuring 8 x 7 x 8 mm, favored to represent a cyst. Ultrasound of
the right axilla demonstrates multiple symmetric lymph nodes.
IMPRESSION: 1. There is an indeterminate 5.1 cm mass at the palpable site in the
right breast.

2.  There is a probable 8 mm cyst in the right breast at [DATE].

3.  No evidence of right axillary lymphadenopathy.

RECOMMENDATION:
Ultrasound-guided biopsy is recommended for the right breast mass at
[DATE]. Aspiration of the probably benign mass at [DATE], 2 cm from the
nipple can also be performed.

I have discussed the findings and recommendations with the patient.
Results were also provided in writing at the conclusion of the
visit. If applicable, a reminder letter will be sent to the patient
regarding the next appointment.

BI-RADS CATEGORY  4: Suspicious.

## 2017-07-10 ENCOUNTER — Telehealth: Payer: Self-pay | Admitting: Hematology and Oncology

## 2017-07-10 ENCOUNTER — Encounter: Payer: Self-pay | Admitting: Hematology and Oncology

## 2017-07-10 ENCOUNTER — Other Ambulatory Visit: Payer: Self-pay | Admitting: General Surgery

## 2017-07-10 DIAGNOSIS — C50511 Malignant neoplasm of lower-outer quadrant of right female breast: Secondary | ICD-10-CM

## 2017-07-10 NOTE — Telephone Encounter (Signed)
Per Rebecca [name] pt needed an earlier appt date and time. Left appt date and time on the pt'[name] vm.

## 2017-07-10 NOTE — Telephone Encounter (Signed)
Appt has been scheduled for the pt to see Dr. Lindi Adie on 3/22 at 10am. Pt aware to arrive 30 minutes early. Letter mailed.

## 2017-07-11 ENCOUNTER — Other Ambulatory Visit: Payer: Self-pay | Admitting: General Surgery

## 2017-07-11 ENCOUNTER — Ambulatory Visit
Admission: RE | Admit: 2017-07-11 | Discharge: 2017-07-11 | Disposition: A | Discharge status: Home / Self Care | Payer: BLUE CROSS/BLUE SHIELD | Source: Ambulatory Visit | Attending: General Surgery | Admitting: General Surgery

## 2017-07-11 ENCOUNTER — Encounter: Payer: Self-pay | Admitting: *Deleted

## 2017-07-11 DIAGNOSIS — C50511 Malignant neoplasm of lower-outer quadrant of right female breast: Secondary | ICD-10-CM

## 2017-07-11 MED ORDER — GADOBENATE DIMEGLUMINE 529 MG/ML IV SOLN
20.0000 mL | Freq: Once | INTRAVENOUS | Status: AC | PRN
  Administered 2017-07-11: 20 mL via INTRAVENOUS

## 2017-07-14 ENCOUNTER — Inpatient Hospital Stay: Payer: BLUE CROSS/BLUE SHIELD | Attending: Hematology and Oncology | Admitting: Hematology and Oncology

## 2017-07-14 ENCOUNTER — Encounter: Payer: Self-pay | Admitting: *Deleted

## 2017-07-14 ENCOUNTER — Encounter: Payer: Self-pay | Admitting: Radiation Oncology

## 2017-07-14 ENCOUNTER — Other Ambulatory Visit: Payer: Self-pay | Admitting: *Deleted

## 2017-07-14 ENCOUNTER — Other Ambulatory Visit: Payer: Self-pay

## 2017-07-14 ENCOUNTER — Telehealth: Payer: Self-pay

## 2017-07-14 ENCOUNTER — Encounter (HOSPITAL_BASED_OUTPATIENT_CLINIC_OR_DEPARTMENT_OTHER): Payer: Self-pay | Admitting: *Deleted

## 2017-07-14 VITALS — BP 151/92 | HR 102 | Temp 98.0°F | Resp 20 | Ht 70.0 in | Wt 224.3 lb

## 2017-07-14 DIAGNOSIS — Z5111 Encounter for antineoplastic chemotherapy: Secondary | ICD-10-CM | POA: Insufficient documentation

## 2017-07-14 DIAGNOSIS — Z79899 Other long term (current) drug therapy: Secondary | ICD-10-CM | POA: Insufficient documentation

## 2017-07-14 DIAGNOSIS — Z171 Estrogen receptor negative status [ER-]: Principal | ICD-10-CM

## 2017-07-14 DIAGNOSIS — R5383 Other fatigue: Secondary | ICD-10-CM | POA: Insufficient documentation

## 2017-07-14 DIAGNOSIS — C50511 Malignant neoplasm of lower-outer quadrant of right female breast: Secondary | ICD-10-CM

## 2017-07-14 DIAGNOSIS — D72819 Decreased white blood cell count, unspecified: Secondary | ICD-10-CM | POA: Diagnosis not present

## 2017-07-14 DIAGNOSIS — Z7689 Persons encountering health services in other specified circumstances: Secondary | ICD-10-CM | POA: Diagnosis not present

## 2017-07-14 DIAGNOSIS — C50411 Malignant neoplasm of upper-outer quadrant of right female breast: Secondary | ICD-10-CM | POA: Insufficient documentation

## 2017-07-14 DIAGNOSIS — D509 Iron deficiency anemia, unspecified: Secondary | ICD-10-CM | POA: Diagnosis not present

## 2017-07-14 MED ORDER — DEXAMETHASONE 4 MG PO TABS: 4.0000 mg | ORAL_TABLET | Freq: Every day | ORAL | 0 refills | Status: DC

## 2017-07-14 MED ORDER — LIDOCAINE-PRILOCAINE 2.5-2.5 % EX CREA: TOPICAL_CREAM | CUTANEOUS | 3 refills | Status: DC

## 2017-07-14 MED ORDER — ONDANSETRON HCL 8 MG PO TABS: 8.0000 mg | ORAL_TABLET | Freq: Two times a day (BID) | ORAL | 1 refills | Status: DC | PRN

## 2017-07-14 MED ORDER — LORAZEPAM 0.5 MG PO TABS: 0.5000 mg | ORAL_TABLET | Freq: Every evening | ORAL | 0 refills | Status: DC | PRN

## 2017-07-14 MED ORDER — PROCHLORPERAZINE MALEATE 10 MG PO TABS: 10.0000 mg | ORAL_TABLET | Freq: Four times a day (QID) | ORAL | 1 refills | Status: DC | PRN

## 2017-07-14 NOTE — Progress Notes (Signed)
[facility] CONSULT NOTE  Patient Care Team: Deland Pretty, MD as PCP - General (Internal Medicine) Baxley, Cresenciano Lick, MD (Internal Medicine)  CHIEF COMPLAINTS/PURPOSE OF CONSULTATION:  Newly diagnosed breast cancer  HISTORY OF PRESENTING ILLNESS:  Rebecca [name] 47 y.o. female is here because of recent diagnosis of right breast cancer.  Patient felt a right breast mass when she was taking a shower.  This immediately prompted evaluation by mammogram and ultrasound.  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  She was seen by Dr. Donne Hazel and was sent to [place] for discussion regarding chemotherapy options.  I reviewed her records extensively and collaborated the history with the patient.  SUMMARY OF ONCOLOGIC HISTORY:   Malignant neoplasm of upper-outer quadrant of right breast in female, estrogen receptor negative ([place])   07/09/2017 Initial Diagnosis    Patient palpated a right breast mass which was evaluated by mammogram and ultrasound and a breast MRI which revealed 4.5 x 4 cm necrotic tumor with suspicious multiple right axillary lymph nodes; biopsy revealed IDC grade 3 ER 0%, PR 0%, HER-2 positive ratio 3.08, Ki-67 70%, T2NX stage IIa/IIb      MEDICAL HISTORY:  Past Medical History:  Diagnosis Date  . Anxiety   . Diabetes mellitus without complication ([place])   . Diverticulitis   . GERD (gastroesophageal reflux disease)   . Hiatal hernia   . Hypothyroidism   . LBBB (left bundle branch block)     SURGICAL HISTORY: Past Surgical History:  Procedure Laterality Date  . CESAREAN SECTION  2007/2010   x2  . LAPAROSCOPIC TUBAL LIGATION  06/12/2011   Procedure: LAPAROSCOPIC TUBAL LIGATION;  Surgeon: Daria Pastures, MD;  Location: [facility];  Service: Gynecology;  Laterality: N/A;  filshie clip  . mini tuck  2012   abdominal  . right ear surg  03/2008   Mohs  . TUBAL  LIGATION    . US ECHOCARDIOGRAPHY  12-30-2007   EF 55-60%  . WISDOM TOOTH EXTRACTION      SOCIAL HISTORY: Social History   Socioeconomic History  . Marital status: Married    Spouse name: Not on file  . Number of children: 2  . Years of education: Not on file  . Highest education level: Not on file  Social Needs  . Financial resource strain: Not on file  . Food insecurity - worry: Not on file  . Food insecurity - inability: Not on file  . Transportation needs - medical: Not on file  . Transportation needs - non-medical: Not on file  Occupational History  . Occupation: [employer] fast streamer  Tobacco Use  . Smoking status: Former Smoker    Packs/day: 0.25    Years: 6.00    Pack years: 1.50    Types: Cigarettes    Last attempt to quit: 03/29/2005    Years since quitting: 12.3  . Smokeless tobacco: Never Used  Substance and Sexual Activity  . Alcohol use: Yes    Comment: socially  . Drug use: No  . Sexual activity: Yes    Birth control/protection: Surgical  Other Topics Concern  . Not on file  Social History Narrative   ** Merged History Encounter **        FAMILY HISTORY: Family History  Problem Relation Age of Onset  . Lymphoma Father   . Cancer Father  lymphoma  . Diabetes Brother   . Stomach cancer Neg Hx   . Colon cancer Neg Hx   . Diabetes Mother     ALLERGIES:  has No Known Allergies.  MEDICATIONS:  Current Outpatient Medications  Medication Sig Dispense Refill  . dexamethasone (DECADRON) 4 MG tablet Take 1 tablet (4 mg total) by mouth daily. Take 1 tablet day before chemo and 1 tablet day after with food 12 tablet 0  . levothyroxine (SYNTHROID, LEVOTHROID) 137 MCG tablet Take 137 mcg by mouth daily before breakfast.    . lidocaine-prilocaine (EMLA) cream Apply to affected area once 30 g 3  . LORazepam (ATIVAN) 0.5 MG tablet Take 1 tablet (0.5 mg total) by mouth at bedtime as needed for sleep. 30 tablet 0  . LORazepam (ATIVAN) 1 MG tablet Take 1 tablet (1  mg total) by mouth every 6 (six) hours as needed for anxiety. 12 tablet 0  . metFORMIN (GLUCOPHAGE) 500 MG tablet Take by mouth daily with breakfast.    . ondansetron (ZOFRAN) 8 MG tablet Take 1 tablet (8 mg total) by mouth 2 (two) times daily as needed (Nausea or vomiting). Begin 4 days after chemotherapy. 30 tablet 1  . phentermine 30 MG capsule Take 30 mg by mouth every morning.    . prochlorperazine (COMPAZINE) 10 MG tablet Take 1 tablet (10 mg total) by mouth every 6 (six) hours as needed (Nausea or vomiting). 30 tablet 1  . propranolol (INNOPRAN XL) 120 MG 24 hr capsule Take 120 mg by mouth at bedtime.    . triamterene-hydrochlorothiazide (MAXZIDE) 75-50 MG tablet One po daily prn lower extremity edema 30 tablet 2   No current facility-administered medications for this visit.     REVIEW OF SYSTEMS:   Constitutional: Denies fevers, chills or abnormal night sweats Eyes: Denies blurriness of vision, double vision or watery eyes Ears, nose, mouth, throat, and face: Denies mucositis or sore throat Respiratory: Denies cough, dyspnea or wheezes Cardiovascular: Denies palpitation, chest discomfort or lower extremity swelling Gastrointestinal:  Denies nausea, heartburn or change in bowel habits Skin: Denies abnormal skin rashes Lymphatics: Denies new lymphadenopathy or easy bruising Neurological:Denies numbness, tingling or new weaknesses Behavioral/Psych: Mood is stable, no new changes  Breast: Large palpable lump in the right breast All other systems were reviewed with the patient and are negative.  PHYSICAL EXAMINATION: ECOG PERFORMANCE STATUS: 1 - Symptomatic but completely ambulatory  Vitals:   07/14/17 1156  BP: (!) 151/92  Pulse: (!) 102  Resp: 20  Temp: 98 F (36.7 C)  SpO2: 100%   Filed Weights   07/14/17 1156  Weight: 224 lb 4.8 oz (101.7 kg)    GENERAL:alert, no distress and comfortable SKIN: skin color, texture, turgor are normal, no rashes or significant  lesions EYES: normal, conjunctiva are pink and non-injected, sclera clear OROPHARYNX:no exudate, no erythema and lips, buccal mucosa, and tongue normal  NECK: supple, thyroid normal size, non-tender, without nodularity LYMPH:  no palpable lymphadenopathy in the cervical, axillary or inguinal LUNGS: clear to auscultation and percussion with normal breathing effort HEART: regular rate & rhythm and no murmurs and no lower extremity edema ABDOMEN:abdomen soft, non-tender and normal bowel sounds Musculoskeletal:no cyanosis of digits and no clubbing  PSYCH: alert & oriented x 3 with fluent speech NEURO: no focal motor/sensory deficits BREAST: Large palpable right breast mass.  No palpable axillary or supraclavicular lymphadenopathy (exam performed in the presence of a chaperone)   LABORATORY DATA:  I have reviewed the data as  listed Lab Results  Component Value Date   WBC 8.8 10/17/2015   HGB 13.0 10/17/2015   HCT 40.6 10/17/2015   MCV 77.3 (L) 10/17/2015   PLT 352 10/17/2015   Lab Results  Component Value Date   NA 138 10/17/2015   K 4.6 10/17/2015   CL 103 10/17/2015   CO2 25 10/17/2015    RADIOGRAPHIC STUDIES: I have personally reviewed the radiological reports and agreed with the findings in the report.  ASSESSMENT AND PLAN:  Malignant neoplasm of upper-outer quadrant of right breast in female, estrogen receptor negative ([place]) 07/09/2017:Patient palpated a right breast mass which was evaluated by mammogram and ultrasound and a breast MRI which revealed 4.5 x 4 cm necrotic tumor with suspicious multiple right axillary lymph nodes; biopsy revealed IDC grade 3 ER 0%, PR 0%, HER-2 positive ratio 3.08, Ki-67 70%, T2NX stage IIa/IIb  Pathology and radiology counseling: Discussed with the patient, the details of pathology including the type of breast cancer,the clinical staging, the significance of ER, PR and HER-2/neu receptors and the implications for treatment. After reviewing the  pathology in detail, we proceeded to discuss the different treatment options between surgery, radiation, chemotherapy, antiestrogen therapies.  Recommendation based on multidisciplinary tumor board: 1. Neoadjuvant chemotherapy with TCH Perjeta 6 cycles followed by Herceptin and Perjeta maintenance for 1 year 2. Followed by bilateral mastectomies with targeted node dissection on the right  3. Followed by adjuvant radiation therapy   Chemotherapy Counseling: I discussed the risks and benefits of chemotherapy including the risks of nausea/ vomiting, risk of infection from low WBC count, fatigue due to chemo or anemia, bruising or bleeding due to low platelets, mouth sores, loss/ change in taste and decreased appetite. Liver and kidney function will be monitored through out chemotherapy as abnormalities in liver and kidney function may be a side effect of treatment. Cardiac dysfunction due to Herceptin and Perjeta were discussed in detail. Risk of permanent bone marrow dysfunction due to chemo were also discussed.  Plan: 1. Port placement 2. Echocardiogram 3. Chemotherapy class 4. CT chest abdomen pelvis and bone scan for staging  Return to clinic this Friday to start chemotherapy.   all questions were answered. The patient knows to call the clinic with any problems, questions or concerns.    Harriette Ohara, MD 07/14/17

## 2017-07-14 NOTE — Telephone Encounter (Signed)
Ativan script faxed to [facility] in [place].  Cyndia Bent RN

## 2017-07-14 NOTE — Assessment & Plan Note (Addendum)
07/09/2017:Patient palpated a right breast mass which was evaluated by mammogram and ultrasound and a breast MRI which revealed 4.5 x 4 cm necrotic tumor with suspicious multiple right axillary lymph nodes; biopsy revealed IDC grade 3 ER 0%, PR 0%, HER-2 positive ratio 3.08, Ki-67 70%, T2NX stage IIa/IIb  Pathology and radiology counseling: Discussed with the patient, the details of pathology including the type of breast cancer,the clinical staging, the significance of ER, PR and HER-2/neu receptors and the implications for treatment. After reviewing the pathology in detail, we proceeded to discuss the different treatment options between surgery, radiation, chemotherapy, antiestrogen therapies.  Recommendation based on multidisciplinary tumor board: 1. Neoadjuvant chemotherapy with TCH Perjeta 6 cycles followed by Herceptin and Perjeta maintenance for 1 year 2. Followed by bilateral mastectomies with targeted node dissection on the right  3. Followed by adjuvant radiation therapy   Chemotherapy Counseling: I discussed the risks and benefits of chemotherapy including the risks of nausea/ vomiting, risk of infection from low WBC count, fatigue due to chemo or anemia, bruising or bleeding due to low platelets, mouth sores, loss/ change in taste and decreased appetite. Liver and kidney function will be monitored through out chemotherapy as abnormalities in liver and kidney function may be a side effect of treatment. Cardiac dysfunction due to Herceptin and Perjeta were discussed in detail. Risk of permanent bone marrow dysfunction due to chemo were also discussed.  Plan: 1. Port placement 2. Echocardiogram 3. Chemotherapy class 4. CT chest abdomen pelvis and bone scan for staging  Return to clinic this Friday to start chemotherapy.

## 2017-07-14 NOTE — Progress Notes (Signed)
START ON PATHWAY REGIMEN - Breast     A cycle is every 21 days:     Pertuzumab      Pertuzumab      Trastuzumab      Trastuzumab      Carboplatin      Docetaxel   **Always confirm dose/schedule in your pharmacy ordering system**    Patient Characteristics: Preoperative or Nonsurgical Candidate (Clinical Staging), Neoadjuvant Therapy followed by Surgery, Invasive Disease, Chemotherapy, HER2 Positive, ER Negative/Unknown Therapeutic Status: Preoperative or Nonsurgical Candidate (Clinical Staging) AJCC M Category: cM0 AJCC Grade: G3 Breast Surgical Plan: Neoadjuvant Therapy followed by Surgery ER Status: Negative (-) AJCC 8 Stage Grouping: IIB HER2 Status: Positive (+) AJCC T Category: cT2 AJCC N Category: cN1 PR Status: Negative (-) Intent of Therapy: Curative Intent, Discussed with Patient

## 2017-07-14 NOTE — Progress Notes (Signed)
Pt given Ensure and instructed to drink by 0915 day of surgery with teach back method.

## 2017-07-15 ENCOUNTER — Ambulatory Visit (HOSPITAL_COMMUNITY)
Admission: RE | Admit: 2017-07-15 | Discharge: 2017-07-15 | Disposition: A | Discharge status: Home / Self Care | Payer: BLUE CROSS/BLUE SHIELD | Source: Ambulatory Visit | Attending: General Surgery | Admitting: General Surgery

## 2017-07-15 ENCOUNTER — Encounter (HOSPITAL_BASED_OUTPATIENT_CLINIC_OR_DEPARTMENT_OTHER): Payer: Self-pay

## 2017-07-15 ENCOUNTER — Other Ambulatory Visit: Payer: Self-pay

## 2017-07-15 ENCOUNTER — Ambulatory Visit (HOSPITAL_COMMUNITY): Payer: BLUE CROSS/BLUE SHIELD

## 2017-07-15 ENCOUNTER — Ambulatory Visit (HOSPITAL_BASED_OUTPATIENT_CLINIC_OR_DEPARTMENT_OTHER): Payer: BLUE CROSS/BLUE SHIELD | Admitting: Anesthesiology

## 2017-07-15 ENCOUNTER — Encounter (HOSPITAL_BASED_OUTPATIENT_CLINIC_OR_DEPARTMENT_OTHER)
Admission: RE | Disposition: A | Discharge status: Home / Self Care | Payer: Self-pay | Source: Ambulatory Visit | Attending: General Surgery

## 2017-07-15 DIAGNOSIS — Z79890 Hormone replacement therapy (postmenopausal): Secondary | ICD-10-CM | POA: Insufficient documentation

## 2017-07-15 DIAGNOSIS — Z79899 Other long term (current) drug therapy: Secondary | ICD-10-CM | POA: Insufficient documentation

## 2017-07-15 DIAGNOSIS — F419 Anxiety disorder, unspecified: Secondary | ICD-10-CM | POA: Insufficient documentation

## 2017-07-15 DIAGNOSIS — C50511 Malignant neoplasm of lower-outer quadrant of right female breast: Secondary | ICD-10-CM | POA: Insufficient documentation

## 2017-07-15 DIAGNOSIS — Z7984 Long term (current) use of oral hypoglycemic drugs: Secondary | ICD-10-CM | POA: Insufficient documentation

## 2017-07-15 DIAGNOSIS — Z791 Long term (current) use of non-steroidal anti-inflammatories (NSAID): Secondary | ICD-10-CM | POA: Insufficient documentation

## 2017-07-15 DIAGNOSIS — Z87891 Personal history of nicotine dependence: Secondary | ICD-10-CM | POA: Insufficient documentation

## 2017-07-15 DIAGNOSIS — C50919 Malignant neoplasm of unspecified site of unspecified female breast: Secondary | ICD-10-CM

## 2017-07-15 DIAGNOSIS — Z95828 Presence of other vascular implants and grafts: Secondary | ICD-10-CM

## 2017-07-15 HISTORY — PX: PORTACATH PLACEMENT: SHX2246

## 2017-07-15 HISTORY — DX: Hypothyroidism, unspecified: E03.9

## 2017-07-15 HISTORY — DX: Left bundle-branch block, unspecified: I44.7

## 2017-07-15 HISTORY — DX: Type 2 diabetes mellitus without complications: E11.9

## 2017-07-15 LAB — GLUCOSE, CAPILLARY
Glucose-Capillary: 76 mg/dL (ref 65–99)
Glucose-Capillary: 97 mg/dL (ref 65–99)

## 2017-07-15 IMAGING — CR DG CHEST 1V PORT
1 series · 1 of 1 positions shown · non-contrast
Comparison: [DATE]

CLINICAL DATA: S/p insertion of port-a-cath. Hx of DM, GERD, hiatal
hernia, LBBB.

EXAM:
PORTABLE CHEST 1 VIEW

[portable]
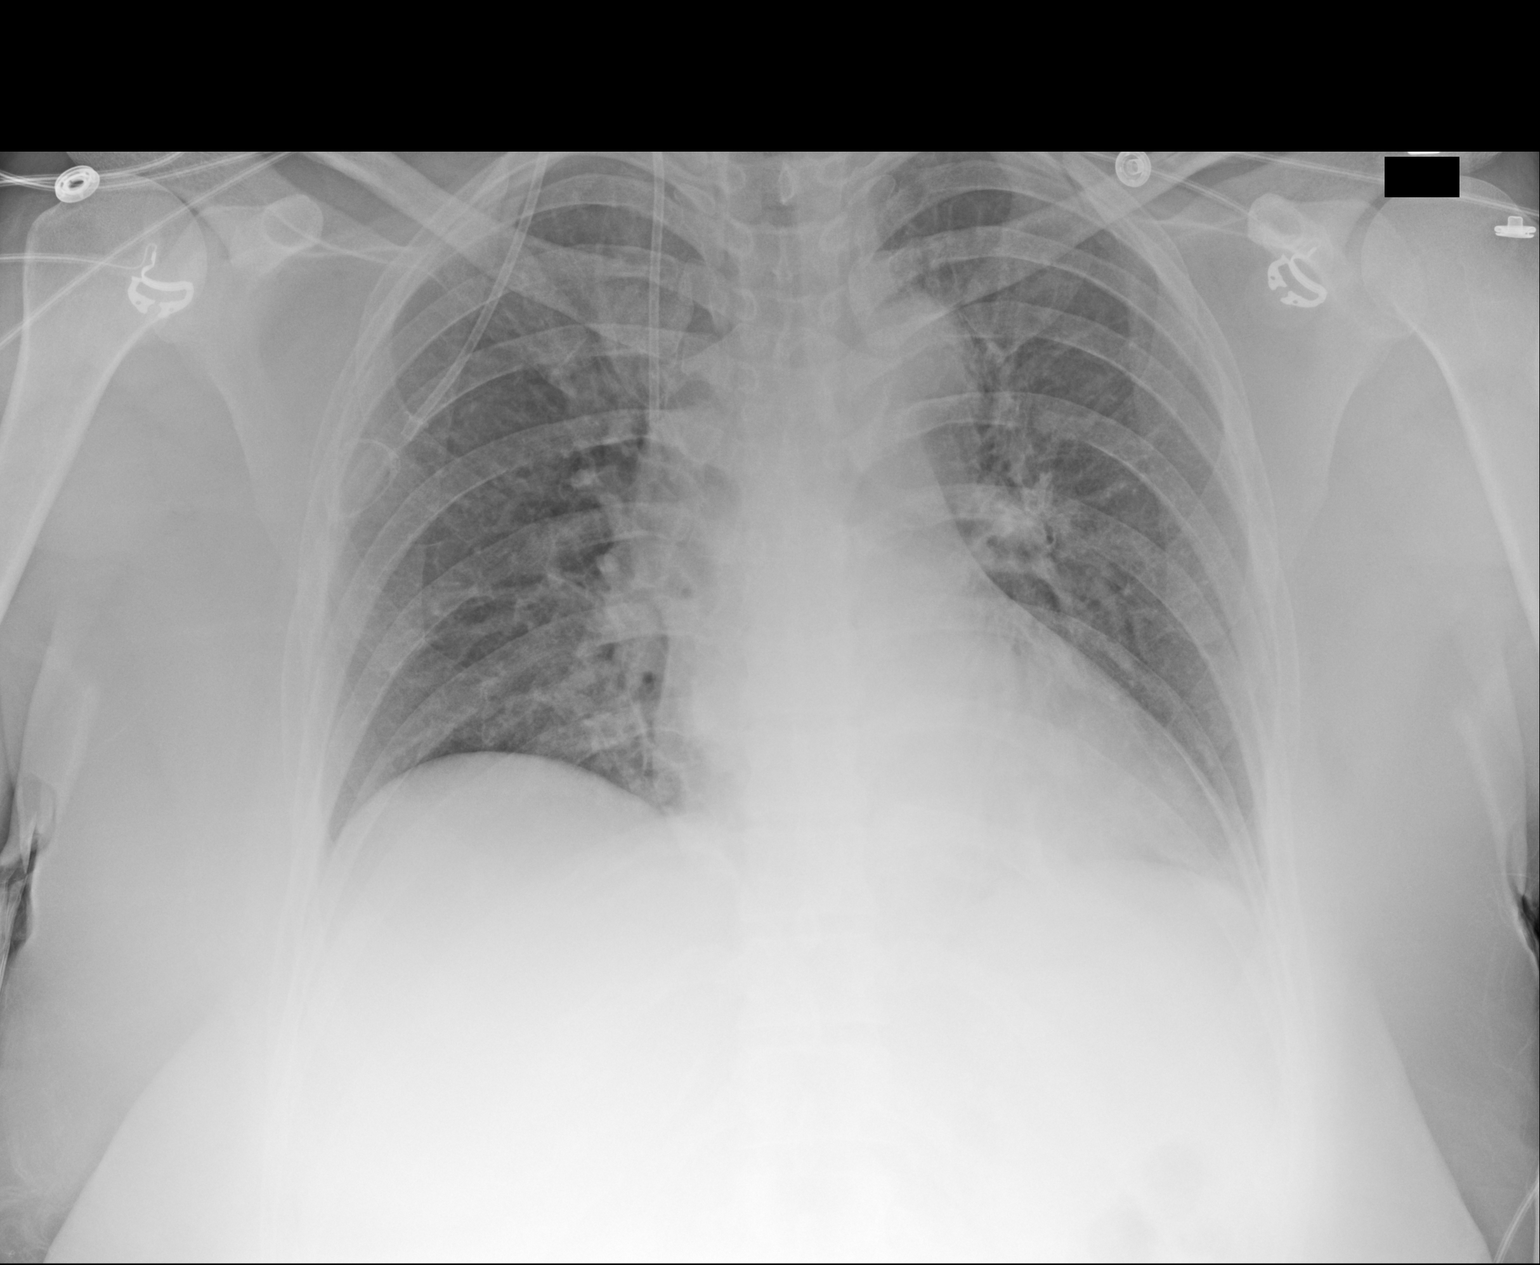

[1 of 1 positions shown; findings below may reference images not displayed]

FINDINGS: Patient has RIGHT-sided Port-A-Cath, tip to the superior vena cava.
The heart size is normal. There is minimal LEFT LOWER lobe
atelectasis. There are no focal consolidations or pleural effusions.
No pneumothorax.
IMPRESSION: 1. Interval placement of RIGHT-sided Port-A-Cath.  No pneumothorax.
2. LEFT LOWER lobe atelectasis.

## 2017-07-15 SURGERY — INSERTION, TUNNELED CENTRAL VENOUS DEVICE, WITH PORT
Anesthesia: General | Site: Chest | Laterality: Right

## 2017-07-15 MED ORDER — PROPOFOL 10 MG/ML IV BOLUS
INTRAVENOUS | Status: DC | PRN
  Administered 2017-07-15: 200 mg via INTRAVENOUS

## 2017-07-15 MED ORDER — CEFAZOLIN SODIUM-DEXTROSE 2-4 GM/100ML-% IV SOLN
INTRAVENOUS | Status: AC
  Filled 2017-07-15: qty 100

## 2017-07-15 MED ORDER — DEXAMETHASONE SODIUM PHOSPHATE 10 MG/ML IJ SOLN
INTRAMUSCULAR | Status: AC
  Filled 2017-07-15: qty 1

## 2017-07-15 MED ORDER — FENTANYL CITRATE (PF) 100 MCG/2ML IJ SOLN
50.0000 ug | INTRAMUSCULAR | Status: DC | PRN
  Administered 2017-07-15: 50 ug via INTRAVENOUS
  Administered 2017-07-15: 100 ug via INTRAVENOUS

## 2017-07-15 MED ORDER — SCOPOLAMINE 1 MG/3DAYS TD PT72: 1.0000 | MEDICATED_PATCH | Freq: Once | TRANSDERMAL | Status: DC | PRN

## 2017-07-15 MED ORDER — HEPARIN (PORCINE) IN NACL 2-0.9 UNIT/ML-% IJ SOLN
INTRAMUSCULAR | Status: AC
  Filled 2017-07-15: qty 500

## 2017-07-15 MED ORDER — MIDAZOLAM HCL 2 MG/2ML IJ SOLN
INTRAMUSCULAR | Status: AC
  Filled 2017-07-15: qty 2

## 2017-07-15 MED ORDER — LIDOCAINE 2% (20 MG/ML) 5 ML SYRINGE
INTRAMUSCULAR | Status: DC | PRN
  Administered 2017-07-15: 100 mg via INTRAVENOUS

## 2017-07-15 MED ORDER — GABAPENTIN 300 MG PO CAPS
300.0000 mg | ORAL_CAPSULE | ORAL | Status: AC
  Administered 2017-07-15: 300 mg via ORAL

## 2017-07-15 MED ORDER — PROMETHAZINE HCL 25 MG/ML IJ SOLN: 6.2500 mg | INTRAMUSCULAR | Status: DC | PRN

## 2017-07-15 MED ORDER — GABAPENTIN 300 MG PO CAPS
ORAL_CAPSULE | ORAL | Status: AC
  Filled 2017-07-15: qty 1

## 2017-07-15 MED ORDER — ACETAMINOPHEN 500 MG PO TABS
ORAL_TABLET | ORAL | Status: AC
  Filled 2017-07-15: qty 2

## 2017-07-15 MED ORDER — DEXAMETHASONE SODIUM PHOSPHATE 4 MG/ML IJ SOLN
INTRAMUSCULAR | Status: DC | PRN
  Administered 2017-07-15: 10 mg via INTRAVENOUS

## 2017-07-15 MED ORDER — LACTATED RINGERS IV SOLN
INTRAVENOUS | Status: DC
  Administered 2017-07-15 (×2): via INTRAVENOUS

## 2017-07-15 MED ORDER — ONDANSETRON HCL 4 MG/2ML IJ SOLN
INTRAMUSCULAR | Status: DC | PRN
  Administered 2017-07-15: 4 mg via INTRAVENOUS

## 2017-07-15 MED ORDER — MEPERIDINE HCL 25 MG/ML IJ SOLN: 6.2500 mg | INTRAMUSCULAR | Status: DC | PRN

## 2017-07-15 MED ORDER — FENTANYL CITRATE (PF) 100 MCG/2ML IJ SOLN
INTRAMUSCULAR | Status: AC
  Filled 2017-07-15: qty 2

## 2017-07-15 MED ORDER — HYDROCODONE-ACETAMINOPHEN 10-325 MG PO TABS: 1.0000 | ORAL_TABLET | Freq: Four times a day (QID) | ORAL | 0 refills | Status: DC | PRN

## 2017-07-15 MED ORDER — CEFAZOLIN SODIUM-DEXTROSE 2-4 GM/100ML-% IV SOLN
2.0000 g | INTRAVENOUS | Status: AC
  Administered 2017-07-15: 2 g via INTRAVENOUS

## 2017-07-15 MED ORDER — FENTANYL CITRATE (PF) 100 MCG/2ML IJ SOLN
25.0000 ug | INTRAMUSCULAR | Status: DC | PRN
  Administered 2017-07-15 (×2): 50 ug via INTRAVENOUS

## 2017-07-15 MED ORDER — HEPARIN SOD (PORK) LOCK FLUSH 100 UNIT/ML IV SOLN
INTRAVENOUS | Status: DC | PRN
  Administered 2017-07-15: 500 [IU] via INTRAVENOUS

## 2017-07-15 MED ORDER — HEPARIN SOD (PORK) LOCK FLUSH 100 UNIT/ML IV SOLN
INTRAVENOUS | Status: AC
  Filled 2017-07-15: qty 5

## 2017-07-15 MED ORDER — BUPIVACAINE-EPINEPHRINE (PF) 0.25% -1:200000 IJ SOLN
INTRAMUSCULAR | Status: DC | PRN
  Administered 2017-07-15: 10 mL via PERINEURAL

## 2017-07-15 MED ORDER — HEPARIN (PORCINE) IN NACL 2-0.9 UNIT/ML-% IJ SOLN
INTRAMUSCULAR | Status: AC | PRN
  Administered 2017-07-15: 500 mL via INTRAVENOUS

## 2017-07-15 MED ORDER — PROPOFOL 10 MG/ML IV BOLUS
INTRAVENOUS | Status: AC
  Filled 2017-07-15: qty 40

## 2017-07-15 MED ORDER — BUPIVACAINE-EPINEPHRINE (PF) 0.25% -1:200000 IJ SOLN
INTRAMUSCULAR | Status: AC
  Filled 2017-07-15: qty 30

## 2017-07-15 MED ORDER — ENSURE PRE-SURGERY PO LIQD: 296.0000 mL | Freq: Once | ORAL | Status: DC

## 2017-07-15 MED ORDER — MIDAZOLAM HCL 2 MG/2ML IJ SOLN
1.0000 mg | INTRAMUSCULAR | Status: DC | PRN
  Administered 2017-07-15: 2 mg via INTRAVENOUS

## 2017-07-15 MED ORDER — ACETAMINOPHEN 500 MG PO TABS
1000.0000 mg | ORAL_TABLET | ORAL | Status: AC
  Administered 2017-07-15: 1000 mg via ORAL

## 2017-07-15 MED ORDER — ONDANSETRON HCL 4 MG/2ML IJ SOLN
INTRAMUSCULAR | Status: AC
  Filled 2017-07-15: qty 2

## 2017-07-15 SURGICAL SUPPLY — 53 items
ADH SKN CLS APL DERMABOND .7 (GAUZE/BANDAGES/DRESSINGS) ×1
APL SKNCLS STERI-STRIP NONHPOA (GAUZE/BANDAGES/DRESSINGS) ×1
BAG DECANTER FOR FLEXI CONT (MISCELLANEOUS) ×3 IMPLANT
BENZOIN TINCTURE PRP APPL 2/3 (GAUZE/BANDAGES/DRESSINGS) ×3 IMPLANT
BLADE SURG 11 STRL SS (BLADE) ×3 IMPLANT
BLADE SURG 15 STRL LF DISP TIS (BLADE) ×1 IMPLANT
BLADE SURG 15 STRL SS (BLADE) ×3
CANISTER SUCT 1200ML W/VALVE (MISCELLANEOUS) IMPLANT
CHLORAPREP W/TINT 26ML (MISCELLANEOUS) ×3 IMPLANT
CLOSURE WOUND 1/2 X4 (GAUZE/BANDAGES/DRESSINGS) ×1
COVER BACK TABLE 60X90IN (DRAPES) ×3 IMPLANT
COVER MAYO STAND STRL (DRAPES) ×3 IMPLANT
COVER PROBE 5X48 (MISCELLANEOUS)
DECANTER SPIKE VIAL GLASS SM (MISCELLANEOUS) IMPLANT
DERMABOND ADVANCED (GAUZE/BANDAGES/DRESSINGS) ×2
DERMABOND ADVANCED .7 DNX12 (GAUZE/BANDAGES/DRESSINGS) ×1 IMPLANT
DRAPE C-ARM 42X72 X-RAY (DRAPES) ×3 IMPLANT
DRAPE LAPAROSCOPIC ABDOMINAL (DRAPES) ×3 IMPLANT
DRAPE UTILITY XL STRL (DRAPES) ×3 IMPLANT
DRSG TEGADERM 4X4.75 (GAUZE/BANDAGES/DRESSINGS) IMPLANT
ELECT COATED BLADE 2.86 ST (ELECTRODE) ×3 IMPLANT
ELECT REM PT RETURN 9FT ADLT (ELECTROSURGICAL) ×3
ELECTRODE REM PT RTRN 9FT ADLT (ELECTROSURGICAL) ×1 IMPLANT
GAUZE SPONGE 4X4 12PLY STRL LF (GAUZE/BANDAGES/DRESSINGS) ×3 IMPLANT
GLOVE BIO SURGEON STRL SZ7 (GLOVE) ×3 IMPLANT
GLOVE BIOGEL PI IND STRL 7.5 (GLOVE) ×1 IMPLANT
GLOVE BIOGEL PI INDICATOR 7.5 (GLOVE) ×2
GOWN STRL REUS W/ TWL LRG LVL3 (GOWN DISPOSABLE) ×2 IMPLANT
GOWN STRL REUS W/TWL LRG LVL3 (GOWN DISPOSABLE) ×6
IV KIT MINILOC 20X1 SAFETY (NEEDLE) IMPLANT
KIT CVR 48X5XPRB PLUP LF (MISCELLANEOUS) IMPLANT
KIT PORT POWER 8FR ISP CVUE (Port) ×2 IMPLANT
NDL HYPO 25X1 1.5 SAFETY (NEEDLE) ×1 IMPLANT
NDL SAFETY ECLIPSE 18X1.5 (NEEDLE) IMPLANT
NEEDLE HYPO 18GX1.5 SHARP (NEEDLE)
NEEDLE HYPO 25X1 1.5 SAFETY (NEEDLE) ×3 IMPLANT
PACK BASIN DAY SURGERY FS (CUSTOM PROCEDURE TRAY) ×3 IMPLANT
PENCIL BUTTON HOLSTER BLD 10FT (ELECTRODE) ×3 IMPLANT
SLEEVE SCD COMPRESS KNEE MED (MISCELLANEOUS) ×3 IMPLANT
STRIP CLOSURE SKIN 1/2X4 (GAUZE/BANDAGES/DRESSINGS) ×2 IMPLANT
SUT MON AB 4-0 PC3 18 (SUTURE) ×3 IMPLANT
SUT PROLENE 2 0 SH DA (SUTURE) ×3 IMPLANT
SUT SILK 2 0 TIES 17X18 (SUTURE)
SUT SILK 2-0 18XBRD TIE BLK (SUTURE) IMPLANT
SUT VIC AB 3-0 SH 27 (SUTURE) ×3
SUT VIC AB 3-0 SH 27X BRD (SUTURE) ×1 IMPLANT
SYR 5ML LUER SLIP (SYRINGE) ×3 IMPLANT
SYR CONTROL 10ML LL (SYRINGE) ×3 IMPLANT
TOWEL OR 17X24 6PK STRL BLUE (TOWEL DISPOSABLE) ×3 IMPLANT
TOWEL OR NON WOVEN STRL DISP B (DISPOSABLE) ×3 IMPLANT
TUBE CONNECTING 20'X1/4 (TUBING)
TUBE CONNECTING 20X1/4 (TUBING) IMPLANT
YANKAUER SUCT BULB TIP NO VENT (SUCTIONS) IMPLANT

## 2017-07-15 NOTE — H&P (Signed)
  47 yom referred by Dr Shelia Media for new right breast cancer. she has no personal history breast disease, no family history breast or ovarian cancer. she had no discharge. she noted a left breast mass. she underwent a mm that shows d density breasts. on [place] she has a 5.1x3x3.1 cm mass at 830 2 cm from the nipple. there is also a small probable cyst. the right axilla is negative. biopsy of the mass was done and is grade III IDC with prognostic panel pending. she is here today to discuss options with her husband. 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 she has an 42 yo son and an 40 yo daughter. she has undergone rai for hyperthyroid recently. she has lbbb and some diastolic dysfunction and is followed by Dr Einar Gip.    Past Surgical History Rolm Bookbinder, MD; 07/10/2017 5:53 PM) Cesarean Section - Multiple   Diagnostic Studies History Rolm Bookbinder, MD; 07/10/2017 5:53 PM) Colonoscopy  never Mammogram  within last year Pap Smear  1-5 years ago  Allergies Alean Rinne, RMA; 07/10/2017 3:36 PM) No Known Drug Allergies [01/07/2014]: Allergies Reconciled   Medication History Rolm Bookbinder, MD; 07/10/2017 5:53 PM) Phentermine HCl (30MG  Capsule, Oral) Active. Levothyroxine Sodium (137MCG Tablet, Oral) Active. MetFORMIN HCl ER (500MG  Tablet ER 24HR, Oral) Active. Propranolol HCl ER (120MG  Capsule ER 24HR, Oral) Active. Adderall (30MG  Tablet, Oral) Active. Citracal Maximum (315-250MG -UNIT Tablet, Oral) Active. Lodine (400MG  Tablet, Oral) Active. Ativan (2MG  Tablet, Oral) Active. Vitamin B-12 (250MCG Tablet, Oral) Active. Medications Reconciled Ibuprofen (200MG  Capsule, Oral) Active. Zonisamide (100MG  Capsule, Oral) Active.  Social History Rolm Bookbinder, MD; 07/10/2017 5:53 PM) Alcohol use  Occasional alcohol use. Caffeine use  Coffee. No drug use  Tobacco use  Former smoker.  Family History Rolm Bookbinder, MD; 07/10/2017 5:53 PM) Diabetes  Mellitus  Mother.  Vitals Alean Rinne RMA; 07/10/2017 3:36 PM) 07/10/2017 3:36 PM Weight: 222.6 lb Height: 70in Body Surface Area: 2.18 m Body Mass Index: 31.94 kg/m  Temp.: 98.51F  Pulse: 116 (Regular)  BP: 130/82 (Sitting, Left Arm, Standard)   Physical Exam Rolm Bookbinder MD; 07/10/2017 5:04 PM) General Mental Status-Alert. Orientation-Oriented X3.  Eye Sclera/Conjunctiva - Bilateral-No scleral icterus.  Chest and Lung Exam Chest and lung exam reveals -quiet, even and easy respiratory effort with no use of accessory muscles and on auscultation, normal breath sounds, no adventitious sounds and normal vocal resonance.  Breast Nipples-No Discharge. Breast Lump Right Lower Outer - Size - 5 cm (Height). Overlying Skin - Normal.  Cardiovascular Cardiovascular examination reveals -normal heart sounds, regular rate and rhythm with no murmurs.  Lymphatic Head & Neck  General Head & Neck Lymphatics: Bilateral - Description - Normal. Axillary  General Axillary Region: Bilateral - Description - Normal. Note: no [place] adenopathy  Assessment & Plan Rolm Bookbinder MD; 07/10/2017 5:53 PM) BREAST CANCER OF LOWER-OUTER QUADRANT OF RIGHT FEMALE BREAST (C50.511) Port placement for primary chemotherapy [place] axilla follow up for mri Staging scans

## 2017-07-15 NOTE — Discharge Instructions (Signed)

## 2017-07-15 NOTE — Interval H&P Note (Signed)
History and Physical Interval Note:  07/15/2017 12:15 PM  Rebecca [name]  has presented today for surgery, with the diagnosis of BREAST CANCER  The various methods of treatment have been discussed with the patient and family. After consideration of risks, benefits and other options for treatment, the patient has consented to  Procedure(s): INSERTION PORT-A-CATH WITH [place] (N/A) as a surgical intervention .  The patient's history has been reviewed, patient examined, no change in status, stable for surgery.  I have reviewed the patient's chart and labs.  Questions were answered to the patient's satisfaction.     Rolm Bookbinder

## 2017-07-15 NOTE — Op Note (Signed)
Preoperative diagnosis:her 2 positive right breast cancer for primary chemotherapy Postoperative diagnosis: same as above Procedure: right ij US guided powerport insertion Surgeon: Dr Serita Grammes EBL: minimal Anes: general  Specimens none Complications none Drains none Sponge count correct Dispo to pacu stable  Indications: This is a37 yof with clinical stage II (nodes still undergoing evaluation) right breast cancer that is her2 positive.  She is indicated for chemotherapy. We discussed port placement.   Procedure: After informed consent was obtained the patient was taken to the operating room. She was given antibiotics. Sequential compression devices were on her legs. She was then placed under general anesthesia with an LMA. Then she was prepped and draped in the standard sterile surgical fashion. Surgical timeout was then performed.  I used the ultrasound to identify the right internal jugular vein. I then accessed the vein using the ultrasound.This aspirated blood. I then placed the wire. This was confirmed by fluoroscopy and ultrasound to be in the correct position.I tunneled the line between the 2 sites.I then dilated the tract and placed the dilator assembly with the sheath. This was done under fluoroscopy. I then removed the sheath and dilator. The wire was also removed. The line was then pulled back to be in the venacava. I hooked this up to the port. I sutured this into place with 2-0 Prolene in 2 places. This aspirated blood and flushed easily.This was confirmed with a final fluoroscopy. I then closed this with 2-0 Vicryl and 4-0 Monocryl. This withdrew blood and I placed heparin in it. Dermabond was placed on both the incisions.A dressing was placed. She tolerated this well and was transferred to the recovery room in stable condition

## 2017-07-15 NOTE — Anesthesia Preprocedure Evaluation (Signed)
Anesthesia Evaluation  Patient identified by MRN, date of birth, ID band Patient awake    Reviewed: Allergy & Precautions, H&P , NPO status , reviewed documented beta blocker date and time   Airway Mallampati: I  TM Distance: >3 FB Neck ROM: full    Dental no notable dental hx. (+) Teeth Intact   Pulmonary neg pulmonary ROS, former smoker,    Pulmonary exam normal        Cardiovascular negative cardio ROS Normal cardiovascular exam+ dysrhythmias  Rhythm:Regular     Neuro/Psych PSYCHIATRIC DISORDERS Anxiety  Neuromuscular disease    GI/Hepatic Neg liver ROS, hiatal hernia, GERD  ,  Endo/Other  diabetesHypothyroidism   Renal/GU negative Renal ROS     Musculoskeletal negative musculoskeletal ROS (+)   Abdominal Normal abdominal exam  (+)   Peds  Hematology negative hematology ROS (+)   Anesthesia Other Findings   Reproductive/Obstetrics negative OB ROS                             Anesthesia Physical  Anesthesia Plan  ASA: II  Anesthesia Plan: General   Post-op Pain Management:    Induction: Intravenous  PONV Risk Score and Plan: 3 and Ondansetron, Dexamethasone and Midazolam  Airway Management Planned: Oral ETT  Additional Equipment:   Intra-op Plan:   Post-operative Plan: Extubation in OR  Informed Consent: I have reviewed the patients History and Physical, chart, labs and discussed the procedure including the risks, benefits and alternatives for the proposed anesthesia with the patient or authorized representative who has indicated his/her understanding and acceptance.   Dental Advisory Given  Plan Discussed with: CRNA  Anesthesia Plan Comments:         Anesthesia Quick Evaluation

## 2017-07-15 NOTE — Anesthesia Postprocedure Evaluation (Signed)
Anesthesia Post Note  Patient: Rebecca [name]  Procedure(s) Performed: INSERTION PORT-A-CATH WITH ULTRASOUND (Right Chest)     Patient location during evaluation: PACU Anesthesia Type: General Level of consciousness: sedated and patient cooperative Pain management: pain level controlled Vital Signs Assessment: post-procedure vital signs reviewed and stable Respiratory status: spontaneous breathing Cardiovascular status: stable Anesthetic complications: no    Last Vitals:  Vitals:   07/15/17 1415 07/15/17 1445  BP: 140/83 (!) 142/84  Pulse: 73 70  Resp: 18 16  Temp:  36.5 C  SpO2: 97% 96%    Last Pain:  Vitals:   07/15/17 1500  TempSrc:   PainSc: [place]

## 2017-07-15 NOTE — Anesthesia Procedure Notes (Signed)
Procedure Name: LMA Insertion Date/Time: 07/15/2017 12:42 PM Performed by: Maryella Shivers, CRNA Pre-anesthesia Checklist: Patient identified, Emergency Drugs available, Suction available and Patient being monitored Patient Re-evaluated:Patient Re-evaluated prior to induction Oxygen Delivery Method: Circle system utilized Preoxygenation: Pre-oxygenation with 100% oxygen Induction Type: IV induction Ventilation: Mask ventilation without difficulty LMA: LMA inserted LMA Size: 4.0 Number of attempts: 1 Airway Equipment and Method: Bite block Placement Confirmation: positive ETCO2 Tube secured with: Tape Dental Injury: Teeth and Oropharynx as per pre-operative assessment

## 2017-07-15 NOTE — Transfer of Care (Signed)
Immediate Anesthesia Transfer of Care Note  Patient: Rebecca [name]  Procedure(s) Performed: INSERTION PORT-A-CATH WITH ULTRASOUND (Right Chest)  Patient Location: PACU  Anesthesia Type:General  Level of Consciousness: sedated  Airway & Oxygen Therapy: Patient Spontanous Breathing and Patient connected to face mask oxygen  Post-op Assessment: Report given to RN and Post -op Vital signs reviewed and stable  Post vital signs: Reviewed and stable  Last Vitals:  Vitals:   07/15/17 1326 07/15/17 1327  BP: 123/82   Pulse:  84  Resp:  14  Temp:    SpO2:  100%    Last Pain:  Vitals:   07/15/17 1143  TempSrc: Oral         Complications: No apparent anesthesia complications

## 2017-07-16 ENCOUNTER — Ambulatory Visit
Admission: RE | Admit: 2017-07-16 | Discharge: 2017-07-16 | Disposition: A | Discharge status: Home / Self Care | Payer: BLUE CROSS/BLUE SHIELD | Source: Ambulatory Visit | Attending: Hematology and Oncology | Admitting: Hematology and Oncology

## 2017-07-16 ENCOUNTER — Telehealth: Payer: Self-pay

## 2017-07-16 ENCOUNTER — Other Ambulatory Visit: Payer: Self-pay | Admitting: Hematology and Oncology

## 2017-07-16 ENCOUNTER — Ambulatory Visit: Admission: RE | Admit: 2017-07-16 | Payer: BLUE CROSS/BLUE SHIELD | Source: Ambulatory Visit

## 2017-07-16 DIAGNOSIS — R590 Localized enlarged lymph nodes: Secondary | ICD-10-CM

## 2017-07-16 DIAGNOSIS — C50511 Malignant neoplasm of lower-outer quadrant of right female breast: Secondary | ICD-10-CM

## 2017-07-16 DIAGNOSIS — N6320 Unspecified lump in the left breast, unspecified quadrant: Secondary | ICD-10-CM

## 2017-07-16 DIAGNOSIS — Z171 Estrogen receptor negative status [ER-]: Principal | ICD-10-CM

## 2017-07-16 HISTORY — DX: Malignant neoplasm of unspecified site of unspecified female breast: C50.919

## 2017-07-16 IMAGING — MG DIGITAL DIAGNOSTIC UNILATERAL LEFT MAMMOGRAM WITH TOMO AND CAD
6 series · 6 of 14 positions shown · non-contrast
Comparison: Previous exam(s).

CLINICAL DATA: Recently diagnosed right breast cancer. Questionable
abnormal lymph nodes in the right axilla seen by MRI for which
patient presents today for second look ultrasound.

be performed today.
EXAM:
DIGITAL DIAGNOSTIC LEFT MAMMOGRAM WITH CAD AND TOMO
ULTRASOUND BILATERAL BREAST

[L CC]
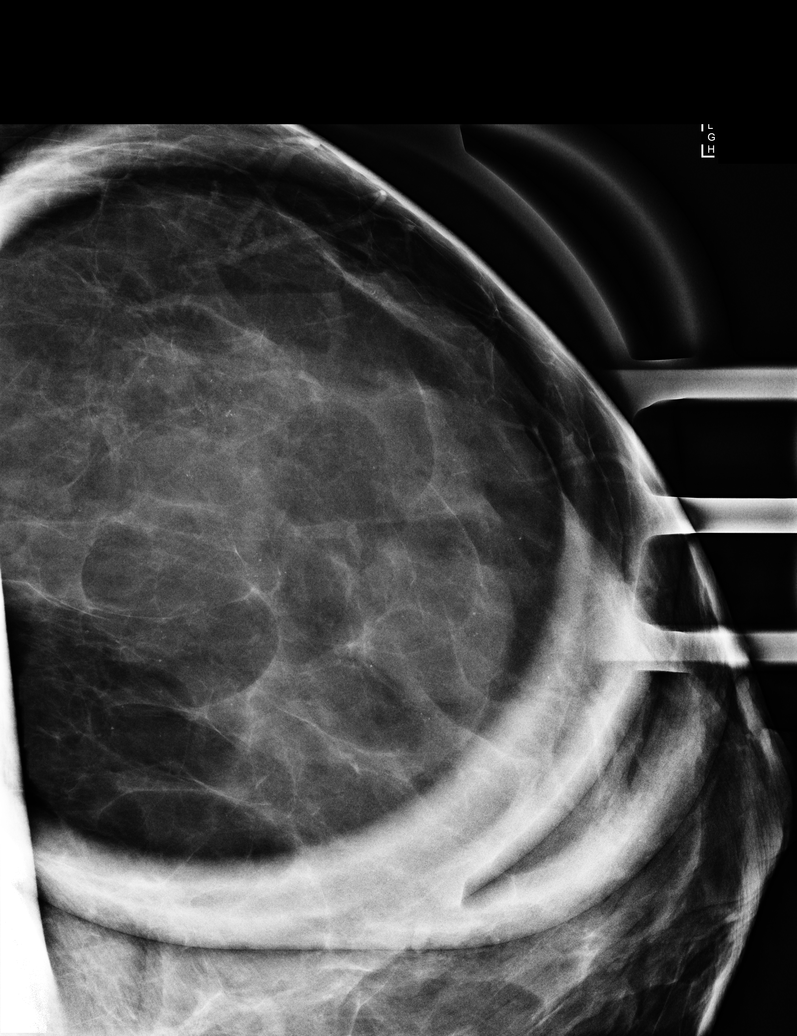

[L ML]
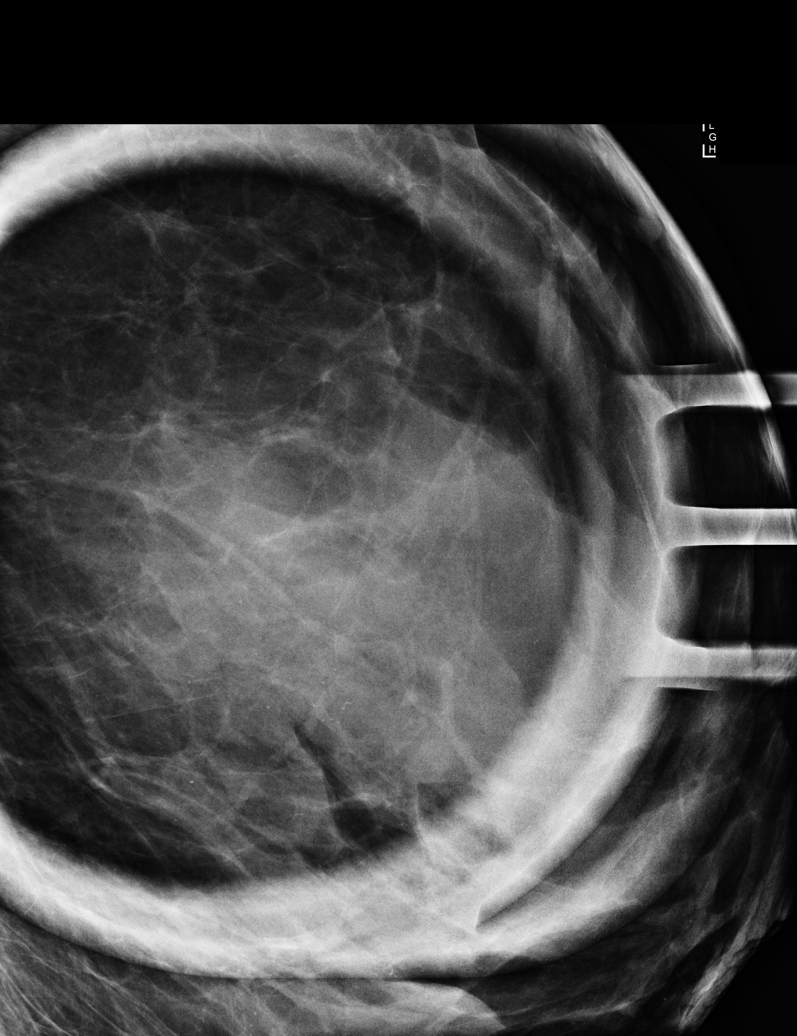

[L CC synth-2D]
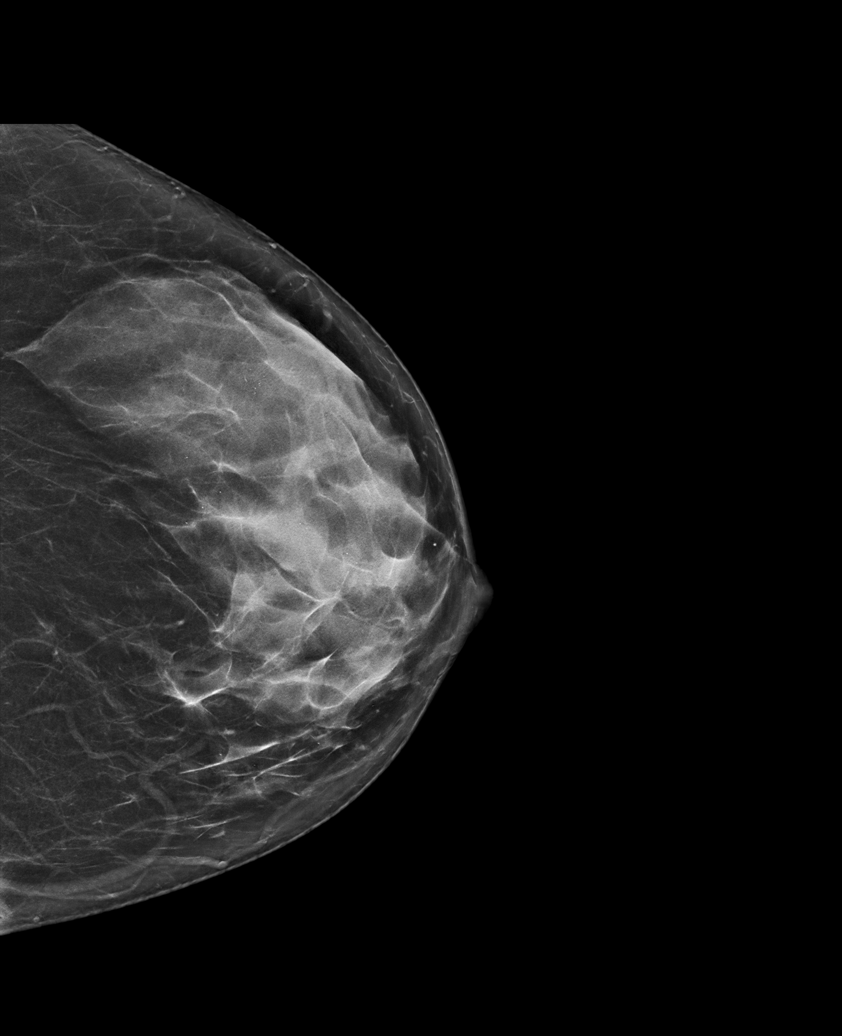

[L MLO synth-2D]
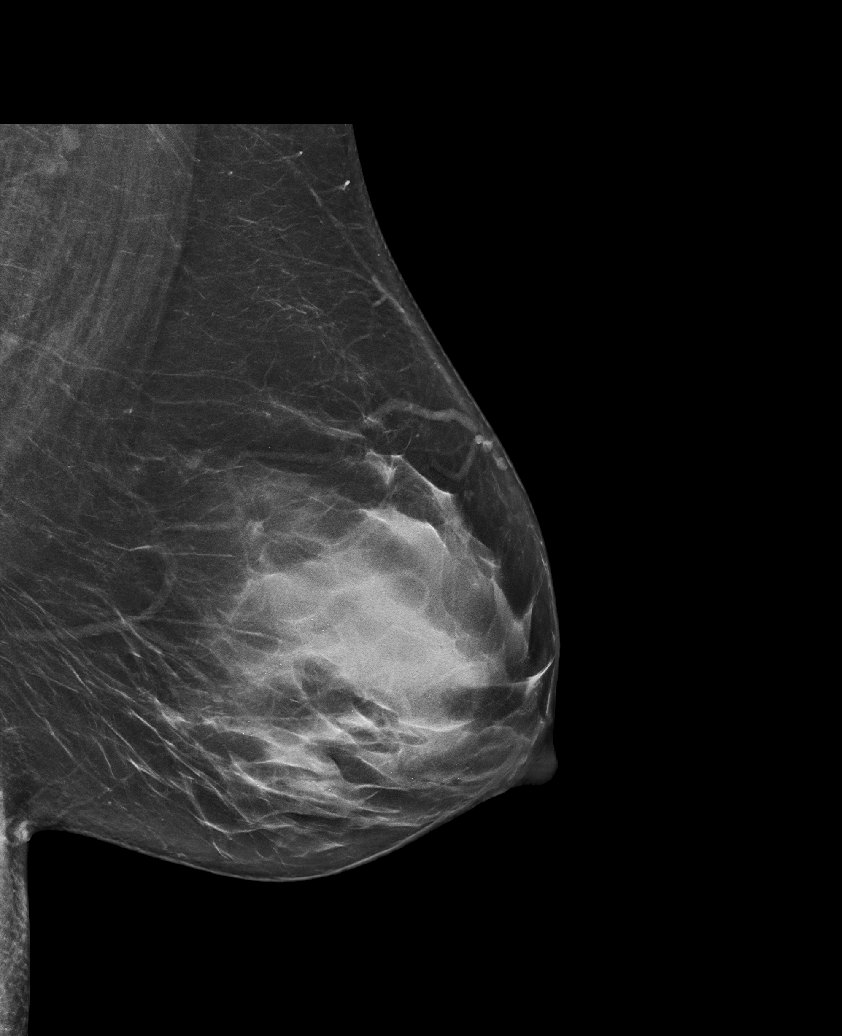

[L MLO tomo · tomo slice 39/76.0]
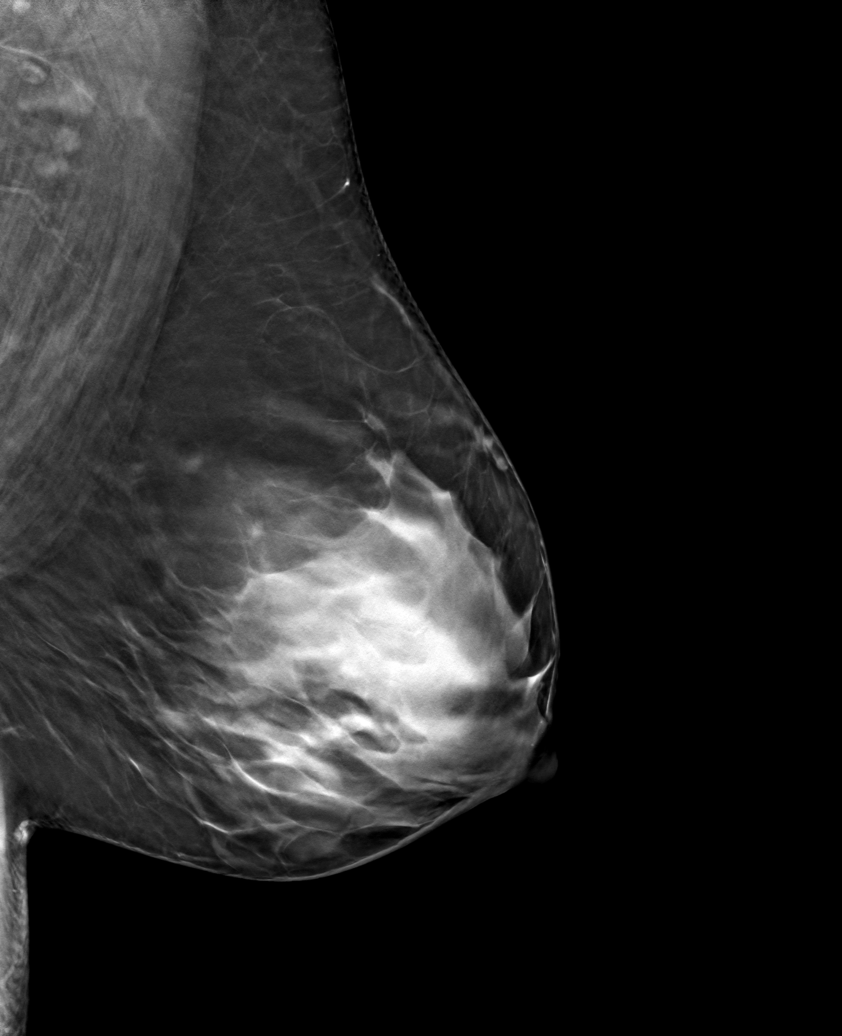

[L CC tomo · tomo slice 35/70.0]
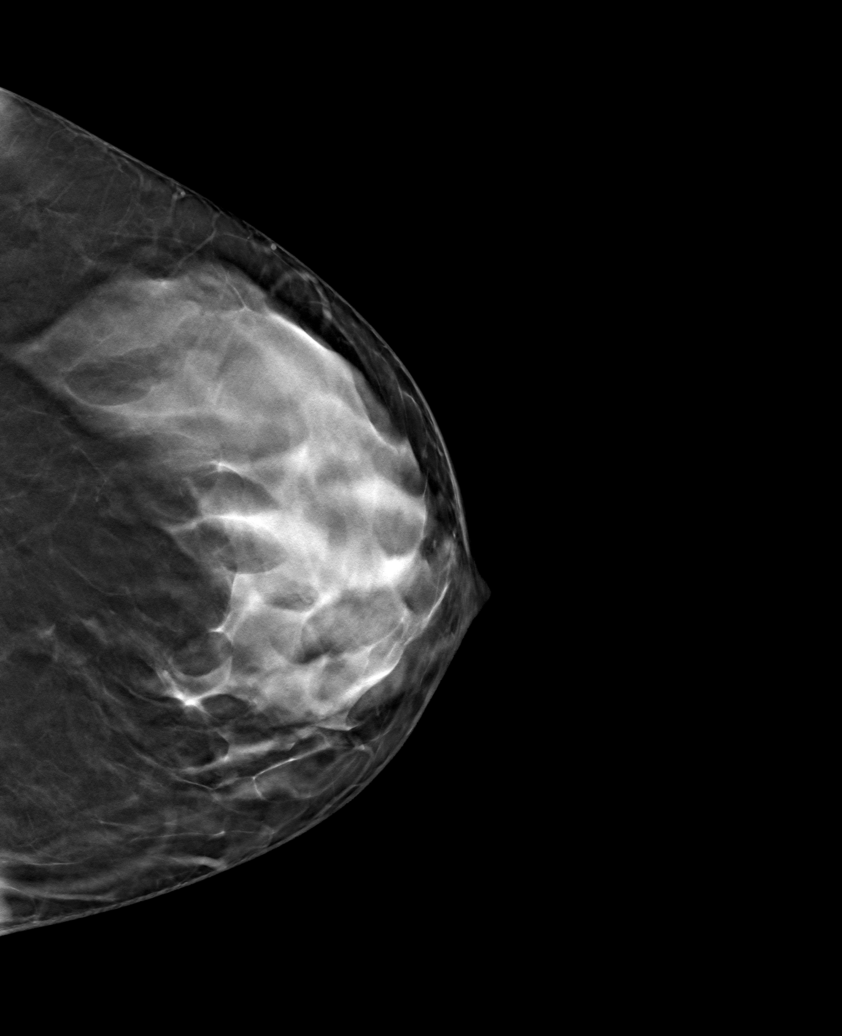

[6 of 14 positions shown; findings below may reference images not displayed]

ACR Breast Density Category d: The breast tissue is extremely dense,
which lowers the sensitivity of mammography.
FINDINGS: Left breast diagnostic mammogram: Scattered benign left breast
calcifications, the majority of which have a layering appearance
compatible with benign milk of calcium (fibrocystic change). No
suspicious pleomorphic or linear branching calcifications are
identified.

Asymmetry within the lower outer quadrant of the left breast will be
further characterized by ultrasound. No additional masses,
suspicious calcifications or secondary signs of malignancy in the
left breast.

Mammographic images were processed with CAD.

Left breast: Targeted ultrasound is performed, showing a benign
simple cyst in the left breast at the 5 o'clock axis, 4 cm from the
nipple, measuring 2 cm, corresponding to the mammographic finding.
In retrospect, this cyst was present on the recent MRI.

Right axilla: Targeted ultrasound is performed, evaluating the right
axilla, showing several normal-appearing lymph nodes. No enlarged or
morphologically abnormal lymph nodes are identified in the right
axilla.
IMPRESSION: 1. No enlarged or morphologically abnormal lymph nodes are
identified in the right axilla.
2. No evidence of malignancy within the left breast. Benign cyst
within the left breast at the 5 o'clock axis measuring 2 cm.

RECOMMENDATION:
Per current treatment plan for patient's known right breast cancer.

I have discussed the findings and recommendations with the patient.
Results were also provided in writing at the conclusion of the
visit. If applicable, a reminder letter will be sent to the patient
regarding the next appointment.

BI-RADS CATEGORY  2: Benign (for today's study). However, patient
with a known biopsy-proven right breast cancer demonstrated on other
recent studies.

## 2017-07-16 IMAGING — US US AXILLARY RIGHT
1 series · 5 of 5 positions shown · non-contrast
Comparison: Previous exam(s).

CLINICAL DATA: Recently diagnosed right breast cancer. Questionable
abnormal lymph nodes in the right axilla seen by MRI for which
patient presents today for second look ultrasound.

be performed today.
EXAM:
DIGITAL DIAGNOSTIC LEFT MAMMOGRAM WITH CAD AND TOMO
ULTRASOUND BILATERAL BREAST

[Series 1: us axillary right · 0.07mm/px · 5 of 5 slices shown]
[im 1/5]
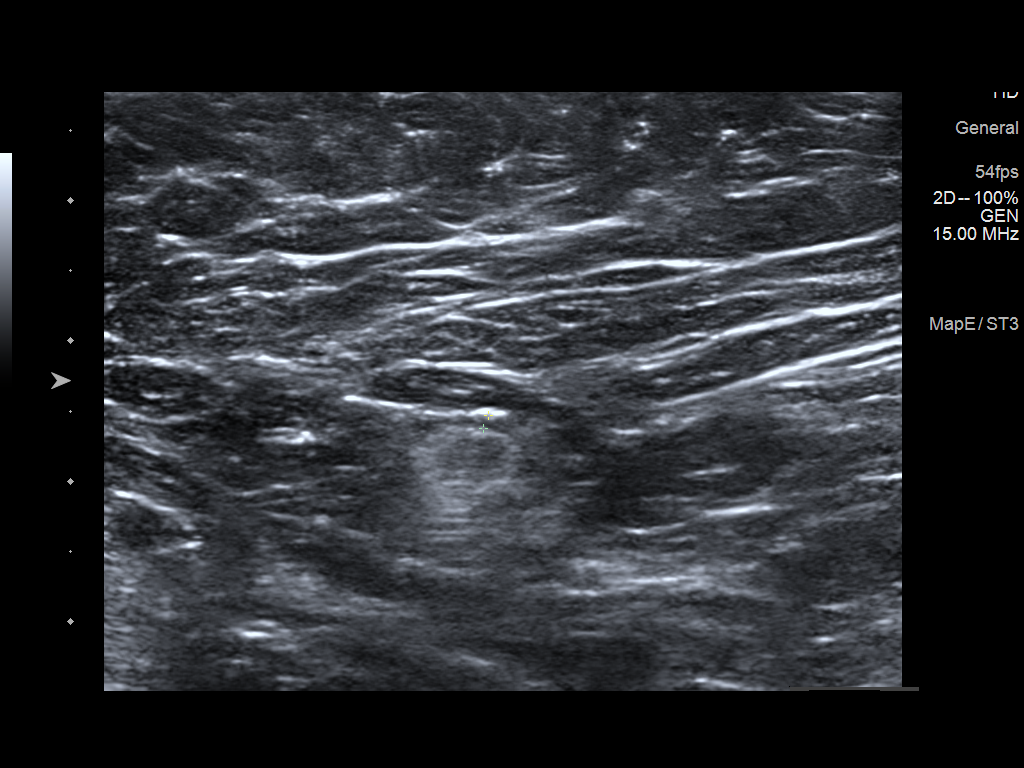
[im 2/5]
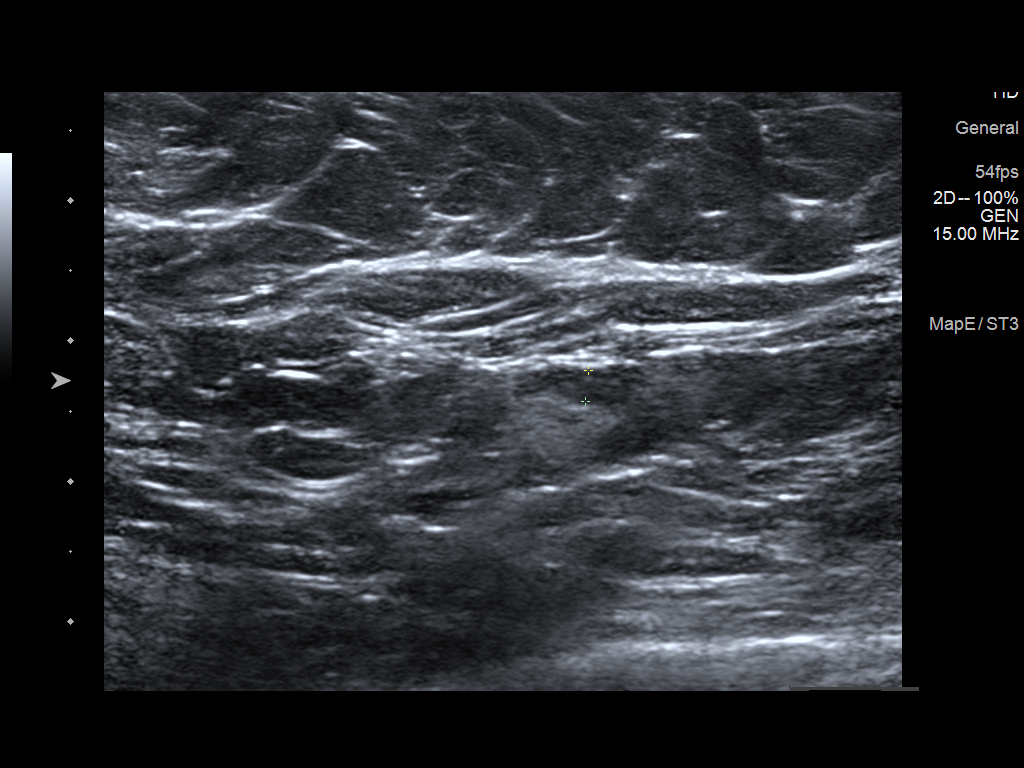
[im 3/5]
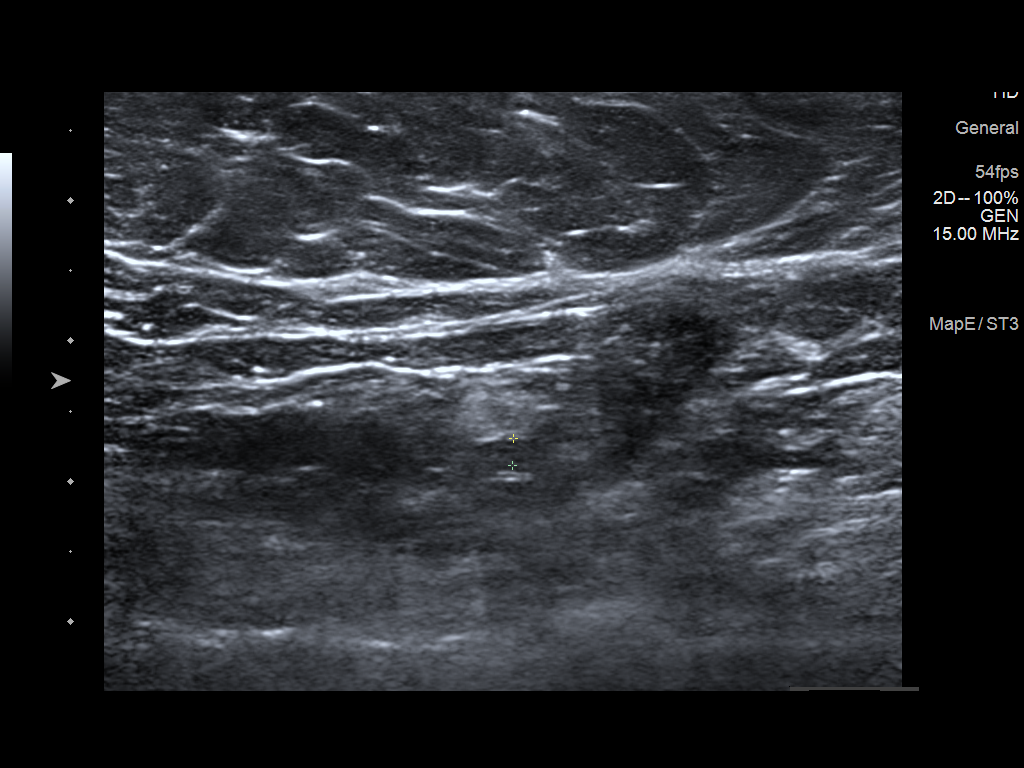
[im 4/5]
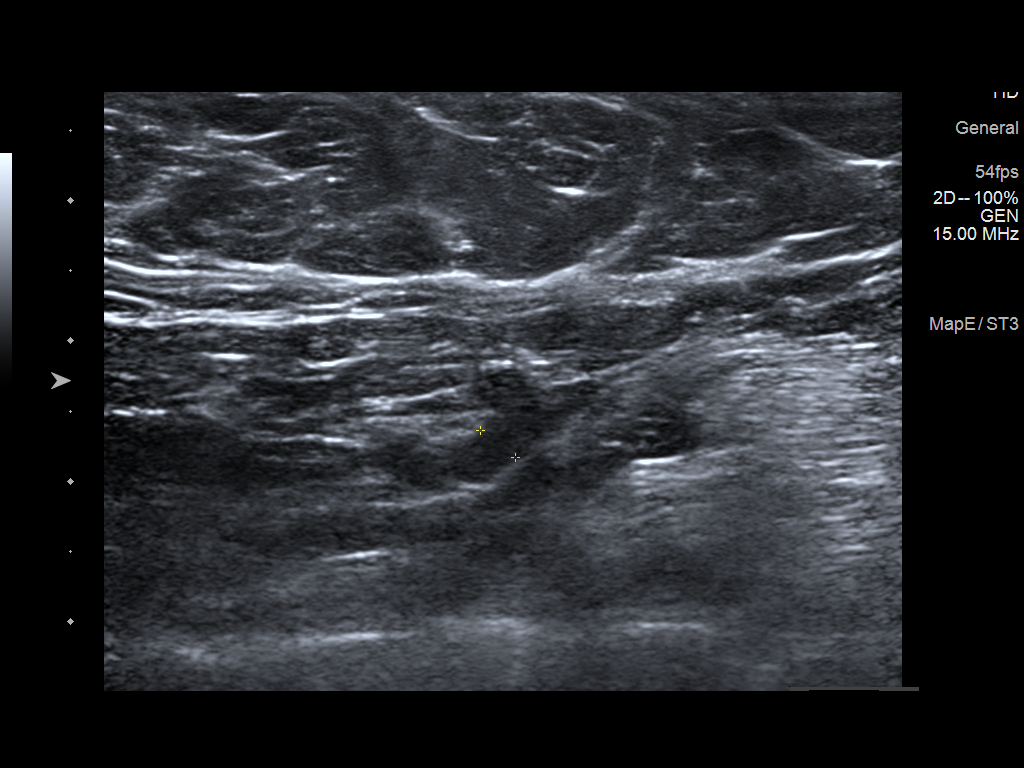
[im 5/5]
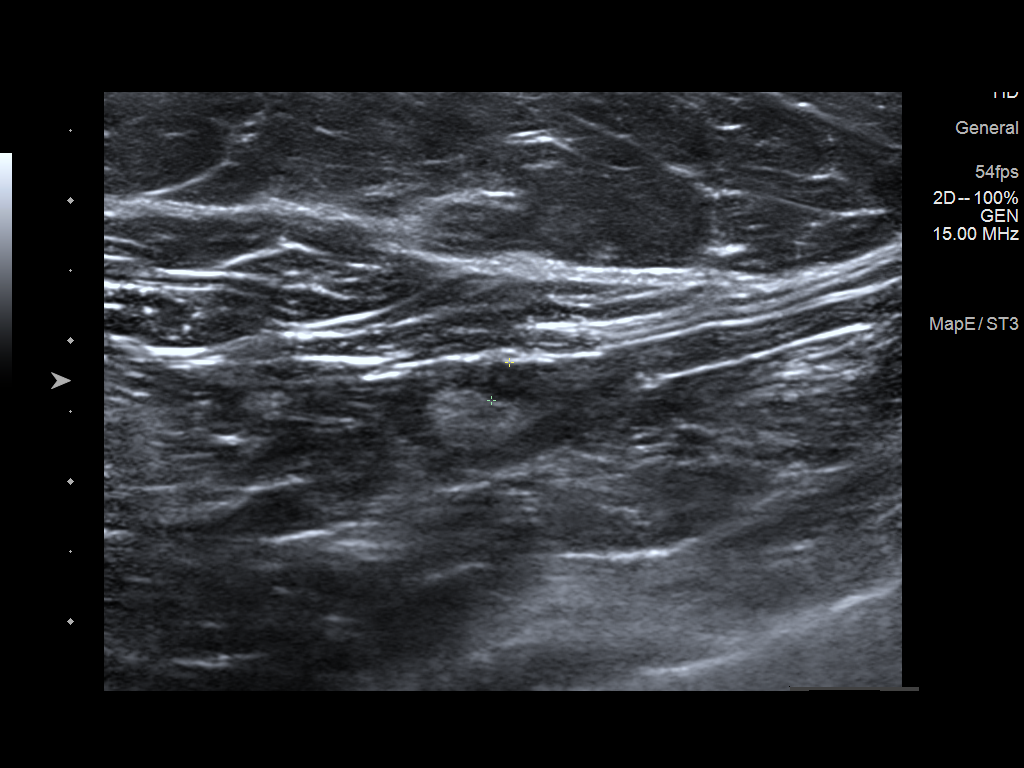

[5 of 5 positions shown; findings below may reference images not displayed]

ACR Breast Density Category d: The breast tissue is extremely dense,
which lowers the sensitivity of mammography.
FINDINGS: Left breast diagnostic mammogram: Scattered benign left breast
calcifications, the majority of which have a layering appearance
compatible with benign milk of calcium (fibrocystic change). No
suspicious pleomorphic or linear branching calcifications are
identified.

Asymmetry within the lower outer quadrant of the left breast will be
further characterized by ultrasound. No additional masses,
suspicious calcifications or secondary signs of malignancy in the
left breast.

Mammographic images were processed with CAD.

Left breast: Targeted ultrasound is performed, showing a benign
simple cyst in the left breast at the 5 o'clock axis, 4 cm from the
nipple, measuring 2 cm, corresponding to the mammographic finding.
In retrospect, this cyst was present on the recent MRI.

Right axilla: Targeted ultrasound is performed, evaluating the right
axilla, showing several normal-appearing lymph nodes. No enlarged or
morphologically abnormal lymph nodes are identified in the right
axilla.
IMPRESSION: 1. No enlarged or morphologically abnormal lymph nodes are
identified in the right axilla.
2. No evidence of malignancy within the left breast. Benign cyst
within the left breast at the 5 o'clock axis measuring 2 cm.

RECOMMENDATION:
Per current treatment plan for patient's known right breast cancer.

I have discussed the findings and recommendations with the patient.
Results were also provided in writing at the conclusion of the
visit. If applicable, a reminder letter will be sent to the patient
regarding the next appointment.

BI-RADS CATEGORY  2: Benign (for today's study). However, patient
with a known biopsy-proven right breast cancer demonstrated on other
recent studies.

## 2017-07-16 IMAGING — US US BREAST*L* LIMITED INC AXILLA
1 series · 4 of 4 positions shown · non-contrast
Comparison: Previous exam(s).

CLINICAL DATA: Recently diagnosed right breast cancer. Questionable
abnormal lymph nodes in the right axilla seen by MRI for which
patient presents today for second look ultrasound.

be performed today.
EXAM:
DIGITAL DIAGNOSTIC LEFT MAMMOGRAM WITH CAD AND TOMO
ULTRASOUND BILATERAL BREAST

[Series 1: us breast*left* limited inc axilla · 0.07mm/px · 4 of 4 slices shown]
[im 1/4]
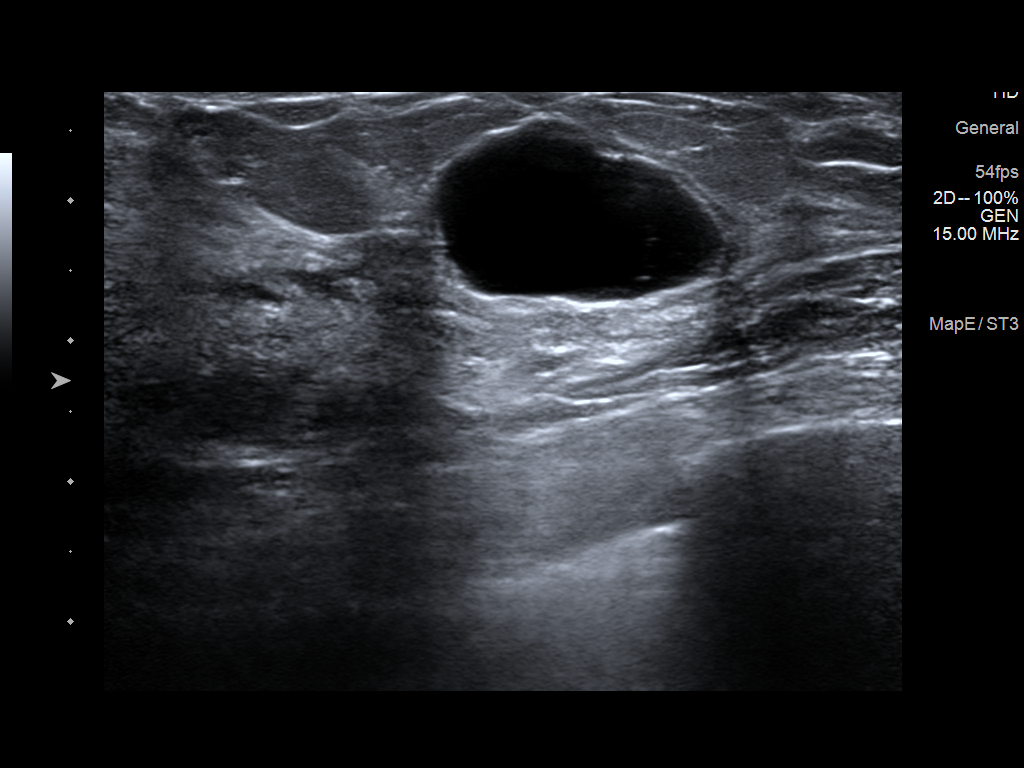
[im 2/4]
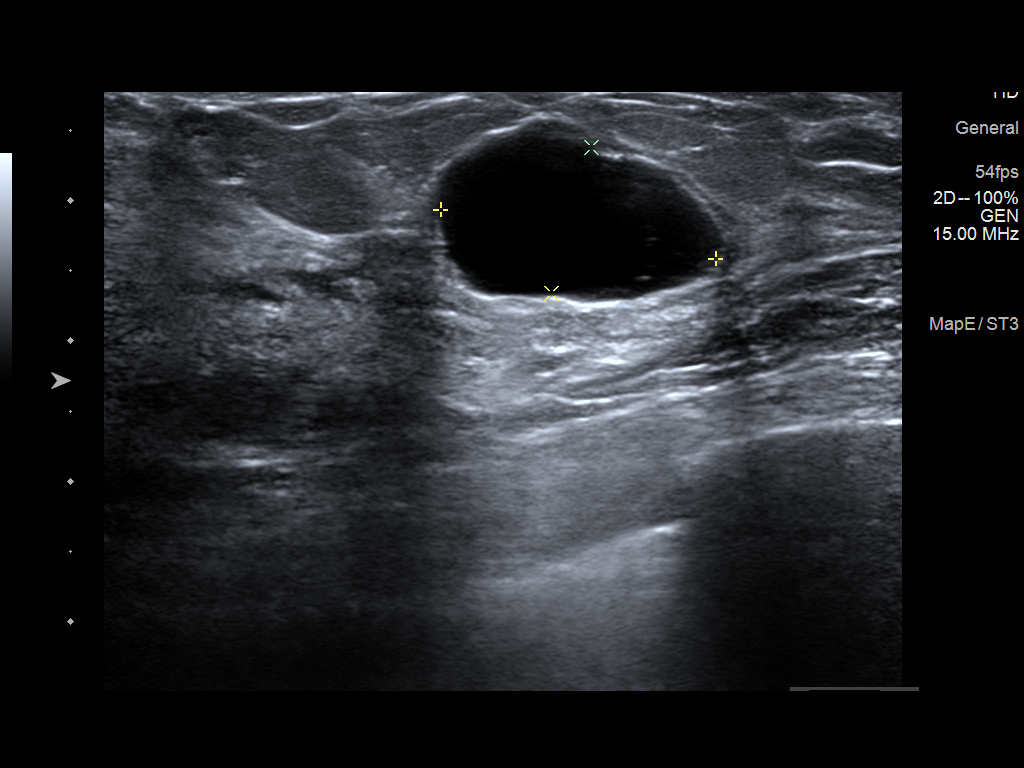
[im 3/4]
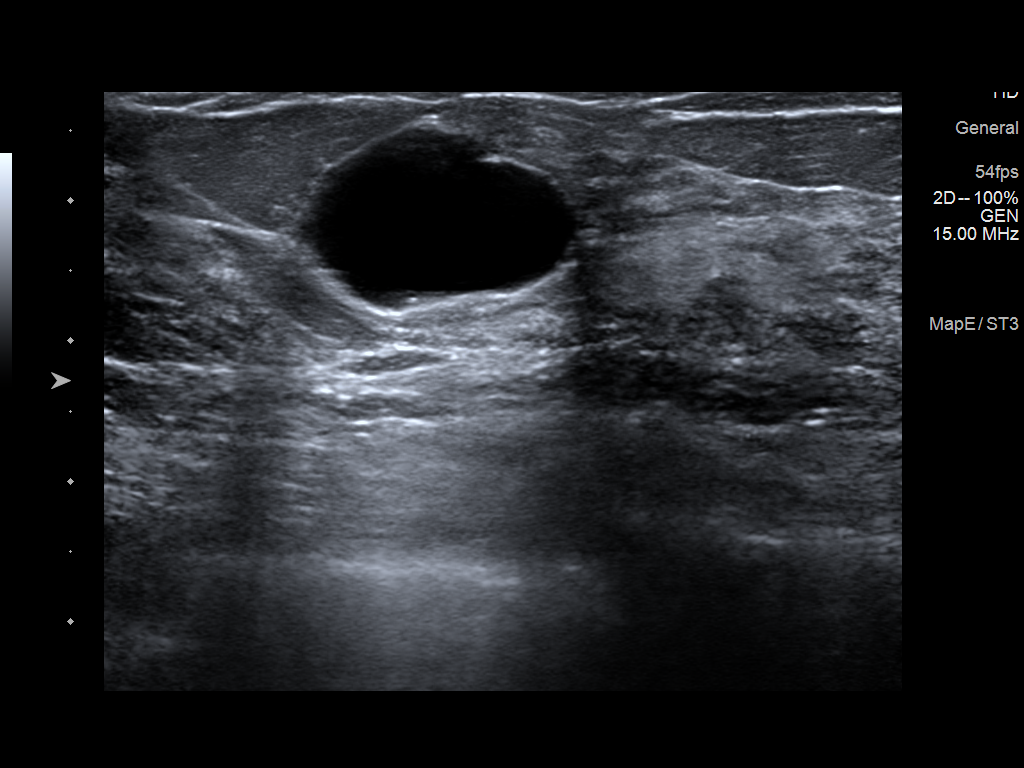
[im 4/4]
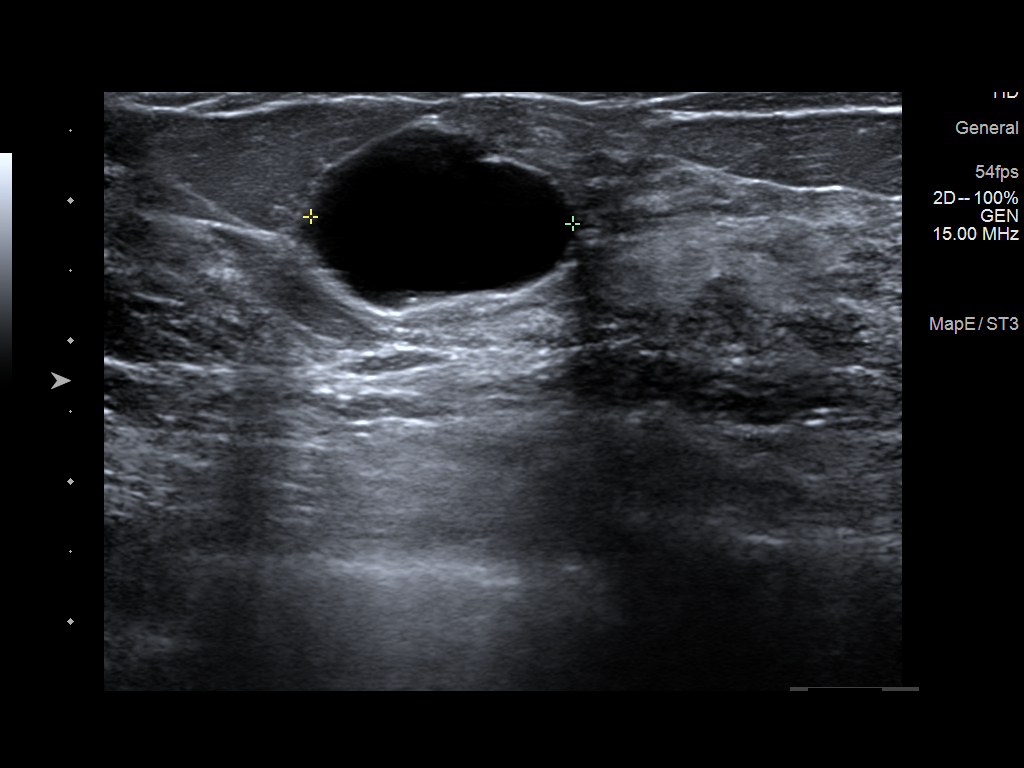

[4 of 4 positions shown; findings below may reference images not displayed]

ACR Breast Density Category d: The breast tissue is extremely dense,
which lowers the sensitivity of mammography.
FINDINGS: Left breast diagnostic mammogram: Scattered benign left breast
calcifications, the majority of which have a layering appearance
compatible with benign milk of calcium (fibrocystic change). No
suspicious pleomorphic or linear branching calcifications are
identified.

Asymmetry within the lower outer quadrant of the left breast will be
further characterized by ultrasound. No additional masses,
suspicious calcifications or secondary signs of malignancy in the
left breast.

Mammographic images were processed with CAD.

Left breast: Targeted ultrasound is performed, showing a benign
simple cyst in the left breast at the 5 o'clock axis, 4 cm from the
nipple, measuring 2 cm, corresponding to the mammographic finding.
In retrospect, this cyst was present on the recent MRI.

Right axilla: Targeted ultrasound is performed, evaluating the right
axilla, showing several normal-appearing lymph nodes. No enlarged or
morphologically abnormal lymph nodes are identified in the right
axilla.
IMPRESSION: 1. No enlarged or morphologically abnormal lymph nodes are
identified in the right axilla.
2. No evidence of malignancy within the left breast. Benign cyst
within the left breast at the 5 o'clock axis measuring 2 cm.

RECOMMENDATION:
Per current treatment plan for patient's known right breast cancer.

I have discussed the findings and recommendations with the patient.
Results were also provided in writing at the conclusion of the
visit. If applicable, a reminder letter will be sent to the patient
regarding the next appointment.

BI-RADS CATEGORY  2: Benign (for today's study). However, patient
with a known biopsy-proven right breast cancer demonstrated on other
recent studies.

## 2017-07-16 NOTE — Telephone Encounter (Signed)
Call received from Kennard at [facility]. Butch Penny states she was attempting to schedule Echocardiogram with patient and patient insisted that she recently had one and Butch Penny wanted to know if scheduling was necessary. RN and Butch Penny unable to validate recent Echo. Butch Penny made aware that patient needs to have an Echo prior or beginning Herceptin tx, currently scheduled for 07/18/17.

## 2017-07-17 ENCOUNTER — Ambulatory Visit (HOSPITAL_COMMUNITY)
Admission: RE | Admit: 2017-07-17 | Discharge: 2017-07-17 | Disposition: A | Discharge status: Home / Self Care | Payer: BLUE CROSS/BLUE SHIELD | Source: Ambulatory Visit | Attending: Hematology and Oncology | Admitting: Hematology and Oncology

## 2017-07-17 DIAGNOSIS — I517 Cardiomegaly: Secondary | ICD-10-CM | POA: Diagnosis not present

## 2017-07-17 DIAGNOSIS — C50511 Malignant neoplasm of lower-outer quadrant of right female breast: Secondary | ICD-10-CM | POA: Diagnosis present

## 2017-07-17 DIAGNOSIS — Z171 Estrogen receptor negative status [ER-]: Secondary | ICD-10-CM | POA: Insufficient documentation

## 2017-07-17 DIAGNOSIS — I209 Angina pectoris, unspecified: Secondary | ICD-10-CM | POA: Insufficient documentation

## 2017-07-17 DIAGNOSIS — Z6832 Body mass index (BMI) 32.0-32.9, adult: Secondary | ICD-10-CM | POA: Diagnosis not present

## 2017-07-17 DIAGNOSIS — E669 Obesity, unspecified: Secondary | ICD-10-CM | POA: Insufficient documentation

## 2017-07-17 DIAGNOSIS — I447 Left bundle-branch block, unspecified: Secondary | ICD-10-CM | POA: Diagnosis not present

## 2017-07-17 NOTE — Progress Notes (Signed)
  Echocardiogram 2D Echocardiogram has been performed.  Rebecca [name] 07/17/2017, 1:55 PM

## 2017-07-18 ENCOUNTER — Encounter: Payer: Self-pay | Admitting: *Deleted

## 2017-07-18 ENCOUNTER — Inpatient Hospital Stay: Payer: BLUE CROSS/BLUE SHIELD

## 2017-07-18 ENCOUNTER — Other Ambulatory Visit: Payer: Self-pay | Admitting: Hematology and Oncology

## 2017-07-18 ENCOUNTER — Encounter: Payer: Self-pay | Admitting: Radiation Oncology

## 2017-07-18 ENCOUNTER — Ambulatory Visit: Payer: BLUE CROSS/BLUE SHIELD | Admitting: Hematology and Oncology

## 2017-07-18 VITALS — BP 142/92 | HR 73 | Temp 97.5°F | Resp 17

## 2017-07-18 DIAGNOSIS — C50411 Malignant neoplasm of upper-outer quadrant of right female breast: Secondary | ICD-10-CM

## 2017-07-18 DIAGNOSIS — Z171 Estrogen receptor negative status [ER-]: Principal | ICD-10-CM

## 2017-07-18 DIAGNOSIS — Z95828 Presence of other vascular implants and grafts: Secondary | ICD-10-CM

## 2017-07-18 LAB — CMP (CANCER CENTER ONLY)
ALK PHOS: 60 U/L (ref 40–150)
ALT: 9 U/L (ref 0–55)
AST: 9 U/L (ref 5–34)
Albumin: 4 g/dL (ref 3.5–5.0)
Anion gap: 11 (ref 3–11)
BILIRUBIN TOTAL: 0.2 mg/dL (ref 0.2–1.2)
BUN: 18 mg/dL (ref 7–26)
CALCIUM: 9.7 mg/dL (ref 8.4–10.4)
CO2: 24 mmol/L (ref 22–29)
CREATININE: 1.01 mg/dL (ref 0.60–1.10)
Chloride: 102 mmol/L (ref 98–109)
GFR, Est AFR Am: >60 mL/min (ref >60)
GLUCOSE: 133 mg/dL (ref 70–140)
POTASSIUM: 4.2 mmol/L (ref 3.5–5.1)
Sodium: 137 mmol/L (ref 136–145)
TOTAL PROTEIN: 7.6 g/dL (ref 6.4–8.3)

## 2017-07-18 LAB — CBC WITH DIFFERENTIAL (CANCER CENTER ONLY)
Basophils Absolute: 0 10*3/uL (ref 0.0–0.1)
Basophils Relative: 0 %
Eosinophils Absolute: 0 10*3/uL (ref 0.0–0.5)
Eosinophils Relative: 0 %
HEMATOCRIT: 36.5 % (ref 34.8–46.6)
HEMOGLOBIN: 11.1 g/dL — AB (ref 11.6–15.9)
LYMPHS PCT: 12 %
Lymphs Abs: 1.3 10*3/uL (ref 0.9–3.3)
MCH: 21.7 pg — ABNORMAL LOW (ref 25.1–34.0)
MCHC: 30.5 g/dL — ABNORMAL LOW (ref 31.5–36.0)
MCV: 70.9 fL — AB (ref 79.5–101.0)
MONO ABS: 0.6 10*3/uL (ref 0.1–0.9)
MONOS PCT: 6 %
NEUTROS ABS: 9.1 10*3/uL — AB (ref 1.5–6.5)
NEUTROS PCT: 82 %
Platelet Count: 348 10*3/uL (ref 145–400)
RBC: 5.15 MIL/uL (ref 3.70–5.45)
RDW: 17.6 % — AB (ref 11.2–14.5)
WBC Count: 11.1 10*3/uL — ABNORMAL HIGH (ref 3.9–10.3)

## 2017-07-18 MED ORDER — DIPHENHYDRAMINE HCL 25 MG PO CAPS
50.0000 mg | ORAL_CAPSULE | Freq: Once | ORAL | Status: AC
  Administered 2017-07-18: 50 mg via ORAL

## 2017-07-18 MED ORDER — SODIUM CHLORIDE 0.9% FLUSH
10.0000 mL | INTRAVENOUS | Status: DC | PRN
  Administered 2017-07-18: 10 mL
  Filled 2017-07-18: qty 10

## 2017-07-18 MED ORDER — SODIUM CHLORIDE 0.9 % IV SOLN
700.0000 mg | Freq: Once | INTRAVENOUS | Status: AC
  Administered 2017-07-18: 700 mg via INTRAVENOUS
  Filled 2017-07-18: qty 70

## 2017-07-18 MED ORDER — ACETAMINOPHEN 325 MG PO TABS
650.0000 mg | ORAL_TABLET | Freq: Once | ORAL | Status: AC
  Administered 2017-07-18: 650 mg via ORAL

## 2017-07-18 MED ORDER — SODIUM CHLORIDE 0.9 % IV SOLN
Freq: Once | INTRAVENOUS | Status: AC
  Administered 2017-07-18: 10:00:00 via INTRAVENOUS

## 2017-07-18 MED ORDER — SODIUM CHLORIDE 0.9 % IV SOLN
840.0000 mg | Freq: Once | INTRAVENOUS | Status: AC
  Administered 2017-07-18: 840 mg via INTRAVENOUS
  Filled 2017-07-18: qty 28

## 2017-07-18 MED ORDER — SODIUM CHLORIDE 0.9% FLUSH
10.0000 mL | INTRAVENOUS | Status: AC | PRN
  Administered 2017-07-18: 10 mL
  Filled 2017-07-18: qty 10

## 2017-07-18 MED ORDER — PALONOSETRON HCL INJECTION 0.25 MG/5ML
INTRAVENOUS | Status: AC
Start: 2017-07-18 — End: ?
  Filled 2017-07-18: qty 5

## 2017-07-18 MED ORDER — DIPHENHYDRAMINE HCL 25 MG PO CAPS
ORAL_CAPSULE | ORAL | Status: AC
  Filled 2017-07-18: qty 2

## 2017-07-18 MED ORDER — PALONOSETRON HCL INJECTION 0.25 MG/5ML
0.2500 mg | Freq: Once | INTRAVENOUS | Status: AC
  Administered 2017-07-18: 0.25 mg via INTRAVENOUS

## 2017-07-18 MED ORDER — SODIUM CHLORIDE 0.9 % IV SOLN
8.0000 mg/kg | Freq: Once | INTRAVENOUS | Status: AC
  Administered 2017-07-18: 819 mg via INTRAVENOUS
  Filled 2017-07-18: qty 39

## 2017-07-18 MED ORDER — DOCETAXEL CHEMO INJECTION 160 MG/16ML
75.0000 mg/m2 | Freq: Once | INTRAVENOUS | Status: AC
  Administered 2017-07-18: 170 mg via INTRAVENOUS
  Filled 2017-07-18: qty 17

## 2017-07-18 MED ORDER — ACETAMINOPHEN 325 MG PO TABS
ORAL_TABLET | ORAL | Status: AC
  Filled 2017-07-18: qty 2

## 2017-07-18 MED ORDER — HEPARIN SOD (PORK) LOCK FLUSH 100 UNIT/ML IV SOLN
500.0000 [IU] | Freq: Once | INTRAVENOUS | Status: AC | PRN
  Administered 2017-07-18: 500 [IU]
  Filled 2017-07-18: qty 5

## 2017-07-18 MED ORDER — SODIUM CHLORIDE 0.9 % IV SOLN
Freq: Once | INTRAVENOUS | Status: AC
  Administered 2017-07-18: 15:00:00 via INTRAVENOUS
  Filled 2017-07-18: qty 5

## 2017-07-18 NOTE — Patient Instructions (Signed)
[facility] Discharge Instructions for Patients Receiving Chemotherapy  Today you received the following chemotherapy agents Herceptin, Perjeta Taxotere, Carboplatin  To help prevent nausea and vomiting after your treatment, we encourage you to take your nausea medication as prescribed.   If you develop nausea and vomiting that is not controlled by your nausea medication, call the clinic.   BELOW ARE SYMPTOMS THAT SHOULD BE REPORTED IMMEDIATELY:  *FEVER GREATER THAN 100.5 F  *CHILLS WITH OR WITHOUT FEVER  NAUSEA AND VOMITING THAT IS NOT CONTROLLED WITH YOUR NAUSEA MEDICATION  *UNUSUAL SHORTNESS OF BREATH  *UNUSUAL BRUISING OR BLEEDING  TENDERNESS IN MOUTH AND THROAT WITH OR WITHOUT PRESENCE OF ULCERS  *URINARY PROBLEMS  *BOWEL PROBLEMS  UNUSUAL RASH Items with * indicate a potential emergency and should be followed up as soon as possible.  Feel free to call the clinic should you have any questions or concerns. The clinic phone number is [phone].  Please show the [facility] at check-in to the Emergency Department and triage nurse.  Trastuzumab injection for infusion (Herceptin) What is this medicine? TRASTUZUMAB (tras TOO zoo mab) is a monoclonal antibody. It is used to treat breast cancer and stomach cancer. This medicine may be used for other purposes; ask your health care provider or pharmacist if you have questions. COMMON BRAND NAME(S): Herceptin What should I tell my health care provider before I take this medicine? They need to know if you have any of these conditions: -heart disease -heart failure -lung or breathing disease, like asthma -an unusual or allergic reaction to trastuzumab, benzyl alcohol, or other medications, foods, dyes, or preservatives -pregnant or trying to get pregnant -breast-feeding How should I use this medicine? This drug is given as an infusion into a vein. It is administered in a hospital or clinic by a  specially trained health care professional. Talk to your pediatrician regarding the use of this medicine in children. This medicine is not approved for use in children. Overdosage: If you think you have taken too much of this medicine contact a poison control center or emergency room at once. NOTE: This medicine is only for you. Do not share this medicine with others. What if I miss a dose? It is important not to miss a dose. Call your doctor or health care professional if you are unable to keep an appointment. What may interact with this medicine? This medicine may interact with the following medications: -certain types of chemotherapy, such as daunorubicin, doxorubicin, epirubicin, and idarubicin This list may not describe all possible interactions. Give your health care provider a list of all the medicines, herbs, non-prescription drugs, or dietary supplements you use. Also tell them if you smoke, drink alcohol, or use illegal drugs. Some items may interact with your medicine. What should I watch for while using this medicine? Visit your doctor for checks on your progress. Report any side effects. Continue your course of treatment even though you feel ill unless your doctor tells you to stop. Call your doctor or health care professional for advice if you get a fever, chills or sore throat, or other symptoms of a cold or flu. Do not treat yourself. Try to avoid being around people who are sick. You may experience fever, chills and shaking during your first infusion. These effects are usually mild and can be treated with other medicines. Report any side effects during the infusion to your health care professional. Fever and chills usually do not happen with later infusions. Do  not become pregnant while taking this medicine or for 7 months after stopping it. Women should inform their doctor if they wish to become pregnant or think they might be pregnant. Women of child-bearing potential will need to  have a negative pregnancy test before starting this medicine. There is a potential for serious side effects to an unborn child. Talk to your health care professional or pharmacist for more information. Do not breast-feed an infant while taking this medicine or for 7 months after stopping it. Women must use effective birth control with this medicine. What side effects may I notice from receiving this medicine? Side effects that you should report to your doctor or health care professional as soon as possible: -allergic reactions like skin rash, itching or hives, swelling of the face, lips, or tongue -chest pain or palpitations -cough -dizziness -feeling faint or lightheaded, falls -fever -general ill feeling or flu-like symptoms -signs of worsening heart failure like breathing problems; swelling in your legs and feet -unusually weak or tired Side effects that usually do not require medical attention (report to your doctor or health care professional if they continue or are bothersome): -bone pain -changes in taste -diarrhea -joint pain -nausea/vomiting -weight loss This list may not describe all possible side effects. Call your doctor for medical advice about side effects. You may report side effects to FDA at 1-800-FDA-1088. Where should I keep my medicine? This drug is given in a hospital or clinic and will not be stored at home. NOTE: This sheet is a summary. It may not cover all possible information. If you have questions about this medicine, talk to your doctor, pharmacist, or health care provider.  2018 Elsevier/Gold Standard (2016-04-09 14:37:52)   Pertuzumab injection (perjeta) What is this medicine? PERTUZUMAB (per TOOZ ue mab) is a monoclonal antibody. It is used to treat breast cancer. This medicine may be used for other purposes; ask your health care provider or pharmacist if you have questions. COMMON BRAND NAME(S): PERJETA What should I tell my health care provider before  I take this medicine? They need to know if you have any of these conditions: -heart disease -heart failure -high blood pressure -history of irregular heart beat -recent or ongoing radiation therapy -an unusual or allergic reaction to pertuzumab, other medicines, foods, dyes, or preservatives -pregnant or trying to get pregnant -breast-feeding How should I use this medicine? This medicine is for infusion into a vein. It is given by a health care professional in a hospital or clinic setting. Talk to your pediatrician regarding the use of this medicine in children. Special care may be needed. Overdosage: If you think you have taken too much of this medicine contact a poison control center or emergency room at once. NOTE: This medicine is only for you. Do not share this medicine with others. What if I miss a dose? It is important not to miss your dose. Call your doctor or health care professional if you are unable to keep an appointment. What may interact with this medicine? Interactions are not expected. Give your health care provider a list of all the medicines, herbs, non-prescription drugs, or dietary supplements you use. Also tell them if you smoke, drink alcohol, or use illegal drugs. Some items may interact with your medicine. This list may not describe all possible interactions. Give your health care provider a list of all the medicines, herbs, non-prescription drugs, or dietary supplements you use. Also tell them if you smoke, drink alcohol, or use illegal drugs. Some  items may interact with your medicine. What should I watch for while using this medicine? Your condition will be monitored carefully while you are receiving this medicine. Report any side effects. Continue your course of treatment even though you feel ill unless your doctor tells you to stop. Do not become pregnant while taking this medicine or for 7 months after stopping it. Women should inform their doctor if they wish to  become pregnant or think they might be pregnant. Women of child-bearing potential will need to have a negative pregnancy test before starting this medicine. There is a potential for serious side effects to an unborn child. Talk to your health care professional or pharmacist for more information. Do not breast-feed an infant while taking this medicine or for 7 months after stopping it. Women must use effective birth control with this medicine. Call your doctor or health care professional for advice if you get a fever, chills or sore throat, or other symptoms of a cold or flu. Do not treat yourself. Try to avoid being around people who are sick. You may experience fever, chills, and headache during the infusion. Report any side effects during the infusion to your health care professional. What side effects may I notice from receiving this medicine? Side effects that you should report to your doctor or health care professional as soon as possible: -breathing problems -chest pain or palpitations -dizziness -feeling faint or lightheaded -fever or chills -skin rash, itching or hives -sore throat -swelling of the face, lips, or tongue -swelling of the legs or ankles -unusually weak or tired Side effects that usually do not require medical attention (report to your doctor or health care professional if they continue or are bothersome): -diarrhea -hair loss -nausea, vomiting -tiredness This list may not describe all possible side effects. Call your doctor for medical advice about side effects. You may report side effects to FDA at 1-800-FDA-1088. Where should I keep my medicine? This drug is given in a hospital or clinic and will not be stored at home. NOTE: This sheet is a summary. It may not cover all possible information. If you have questions about this medicine, talk to your doctor, pharmacist, or health care provider.  2018 Elsevier/Gold Standard (2015-05-18 12:08:50)   Docetaxel injection  (taxotere) What is this medicine? DOCETAXEL (doe se TAX el) is a chemotherapy drug. It targets fast dividing cells, like cancer cells, and causes these cells to die. This medicine is used to treat many types of cancers like breast cancer, certain stomach cancers, head and neck cancer, lung cancer, and prostate cancer. This medicine may be used for other purposes; ask your health care provider or pharmacist if you have questions. COMMON BRAND NAME(S): Docefrez, Taxotere What should I tell my health care provider before I take this medicine? They need to know if you have any of these conditions: -infection (especially a virus infection such as chickenpox, cold sores, or herpes) -liver disease -low blood counts, like low white cell, platelet, or red cell counts -an unusual or allergic reaction to docetaxel, polysorbate 80, other chemotherapy agents, other medicines, foods, dyes, or preservatives -pregnant or trying to get pregnant -breast-feeding How should I use this medicine? This drug is given as an infusion into a vein. It is administered in a hospital or clinic by a specially trained health care professional. Talk to your pediatrician regarding the use of this medicine in children. Special care may be needed. Overdosage: If you think you have taken too much of this  medicine contact a poison control center or emergency room at once. NOTE: This medicine is only for you. Do not share this medicine with others. What if I miss a dose? It is important not to miss your dose. Call your doctor or health care professional if you are unable to keep an appointment. What may interact with this medicine? -cyclosporine -erythromycin -ketoconazole -medicines to increase blood counts like filgrastim, pegfilgrastim, sargramostim -vaccines Talk to your doctor or health care professional before taking any of these medicines: -acetaminophen -aspirin -ibuprofen -ketoprofen -naproxen This list may not  describe all possible interactions. Give your health care provider a list of all the medicines, herbs, non-prescription drugs, or dietary supplements you use. Also tell them if you smoke, drink alcohol, or use illegal drugs. Some items may interact with your medicine. What should I watch for while using this medicine? Your condition will be monitored carefully while you are receiving this medicine. You will need important blood work done while you are taking this medicine. This drug may make you feel generally unwell. This is not uncommon, as chemotherapy can affect healthy cells as well as cancer cells. Report any side effects. Continue your course of treatment even though you feel ill unless your doctor tells you to stop. In some cases, you may be given additional medicines to help with side effects. Follow all directions for their use. Call your doctor or health care professional for advice if you get a fever, chills or sore throat, or other symptoms of a cold or flu. Do not treat yourself. This drug decreases your body's ability to fight infections. Try to avoid being around people who are sick. This medicine may increase your risk to bruise or bleed. Call your doctor or health care professional if you notice any unusual bleeding. This medicine may contain alcohol in the product. You may get drowsy or dizzy. Do not drive, use machinery, or do anything that needs mental alertness until you know how this medicine affects you. Do not stand or sit up quickly, especially if you are an older patient. This reduces the risk of dizzy or fainting spells. Avoid alcoholic drinks. Do not become pregnant while taking this medicine. Women should inform their doctor if they wish to become pregnant or think they might be pregnant. There is a potential for serious side effects to an unborn child. Talk to your health care professional or pharmacist for more information. Do not breast-feed an infant while taking this  medicine. What side effects may I notice from receiving this medicine? Side effects that you should report to your doctor or health care professional as soon as possible: -allergic reactions like skin rash, itching or hives, swelling of the face, lips, or tongue -low blood counts - This drug may decrease the number of white blood cells, red blood cells and platelets. You may be at increased risk for infections and bleeding. -signs of infection - fever or chills, cough, sore throat, pain or difficulty passing urine -signs of decreased platelets or bleeding - bruising, pinpoint red spots on the skin, black, tarry stools, nosebleeds -signs of decreased red blood cells - unusually weak or tired, fainting spells, lightheadedness -breathing problems -fast or irregular heartbeat -low blood pressure -mouth sores -nausea and vomiting -pain, swelling, redness or irritation at the injection site -pain, tingling, numbness in the hands or feet -swelling of the ankle, feet, hands -weight gain Side effects that usually do not require medical attention (report to your doctor or health care professional  if they continue or are bothersome): -bone pain -complete hair loss including hair on your head, underarms, pubic hair, eyebrows, and eyelashes -diarrhea -excessive tearing -changes in the color of fingernails -loosening of the fingernails -nausea -muscle pain -red flush to skin -sweating -weak or tired This list may not describe all possible side effects. Call your doctor for medical advice about side effects. You may report side effects to FDA at 1-800-FDA-1088. Where should I keep my medicine? This drug is given in a hospital or clinic and will not be stored at home. NOTE: This sheet is a summary. It may not cover all possible information. If you have questions about this medicine, talk to your doctor, pharmacist, or health care provider.  2018 Elsevier/Gold Standard (2015-05-18  12:32:56)   Carboplatin injection What is this medicine? CARBOPLATIN (KAR boe pla tin) is a chemotherapy drug. It targets fast dividing cells, like cancer cells, and causes these cells to die. This medicine is used to treat ovarian cancer and many other cancers. This medicine may be used for other purposes; ask your health care provider or pharmacist if you have questions. COMMON BRAND NAME(S): Paraplatin What should I tell my health care provider before I take this medicine? They need to know if you have any of these conditions: -blood disorders -hearing problems -kidney disease -recent or ongoing radiation therapy -an unusual or allergic reaction to carboplatin, cisplatin, other chemotherapy, other medicines, foods, dyes, or preservatives -pregnant or trying to get pregnant -breast-feeding How should I use this medicine? This drug is usually given as an infusion into a vein. It is administered in a hospital or clinic by a specially trained health care professional. Talk to your pediatrician regarding the use of this medicine in children. Special care may be needed. Overdosage: If you think you have taken too much of this medicine contact a poison control center or emergency room at once. NOTE: This medicine is only for you. Do not share this medicine with others. What if I miss a dose? It is important not to miss a dose. Call your doctor or health care professional if you are unable to keep an appointment. What may interact with this medicine? -medicines for seizures -medicines to increase blood counts like filgrastim, pegfilgrastim, sargramostim -some antibiotics like amikacin, gentamicin, neomycin, streptomycin, tobramycin -vaccines Talk to your doctor or health care professional before taking any of these medicines: -acetaminophen -aspirin -ibuprofen -ketoprofen -naproxen This list may not describe all possible interactions. Give your health care provider a list of all the  medicines, herbs, non-prescription drugs, or dietary supplements you use. Also tell them if you smoke, drink alcohol, or use illegal drugs. Some items may interact with your medicine. What should I watch for while using this medicine? Your condition will be monitored carefully while you are receiving this medicine. You will need important blood work done while you are taking this medicine. This drug may make you feel generally unwell. This is not uncommon, as chemotherapy can affect healthy cells as well as cancer cells. Report any side effects. Continue your course of treatment even though you feel ill unless your doctor tells you to stop. In some cases, you may be given additional medicines to help with side effects. Follow all directions for their use. Call your doctor or health care professional for advice if you get a fever, chills or sore throat, or other symptoms of a cold or flu. Do not treat yourself. This drug decreases your body's ability to fight infections. Try  to avoid being around people who are sick. This medicine may increase your risk to bruise or bleed. Call your doctor or health care professional if you notice any unusual bleeding. Be careful brushing and flossing your teeth or using a toothpick because you may get an infection or bleed more easily. If you have any dental work done, tell your dentist you are receiving this medicine. Avoid taking products that contain aspirin, acetaminophen, ibuprofen, naproxen, or ketoprofen unless instructed by your doctor. These medicines may hide a fever. Do not become pregnant while taking this medicine. Women should inform their doctor if they wish to become pregnant or think they might be pregnant. There is a potential for serious side effects to an unborn child. Talk to your health care professional or pharmacist for more information. Do not breast-feed an infant while taking this medicine. What side effects may I notice from receiving this  medicine? Side effects that you should report to your doctor or health care professional as soon as possible: -allergic reactions like skin rash, itching or hives, swelling of the face, lips, or tongue -signs of infection - fever or chills, cough, sore throat, pain or difficulty passing urine -signs of decreased platelets or bleeding - bruising, pinpoint red spots on the skin, black, tarry stools, nosebleeds -signs of decreased red blood cells - unusually weak or tired, fainting spells, lightheadedness -breathing problems -changes in hearing -changes in vision -chest pain -high blood pressure -low blood counts - This drug may decrease the number of white blood cells, red blood cells and platelets. You may be at increased risk for infections and bleeding. -nausea and vomiting -pain, swelling, redness or irritation at the injection site -pain, tingling, numbness in the hands or feet -problems with balance, talking, walking -trouble passing urine or change in the amount of urine Side effects that usually do not require medical attention (report to your doctor or health care professional if they continue or are bothersome): -hair loss -loss of appetite -metallic taste in the mouth or changes in taste This list may not describe all possible side effects. Call your doctor for medical advice about side effects. You may report side effects to FDA at 1-800-FDA-1088. Where should I keep my medicine? This drug is given in a hospital or clinic and will not be stored at home. NOTE: This sheet is a summary. It may not cover all possible information. If you have questions about this medicine, talk to your doctor, pharmacist, or health care provider.  2018 Elsevier/Gold Standard (2007-07-21 14:38:05)

## 2017-07-18 NOTE — Progress Notes (Signed)
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 Prognostic markers will be ordered.  Receptor Status: ER(0%), PR (0%), Her2-neu (positive), Ki-(70%)  Did patient present with symptoms (if so, please note symptoms) or was this found on screening mammography?: patient felt a mass herself.  Past/Anticipated interventions by surgeon, if any: 4 weeks after chemotherapy - probably August  Past/Anticipated interventions by medical oncology, if any: Per Dr. Lindi Adie, the plan is for neoadjuvant chemotherapy with [facility] Perjeta 6 cycles followed by Herceptin and Perjeta maintenance for 1 year. Followed by bilateral mastectomies with targeted node dissection on the right . Followed by adjuvant radiation therapy.  She started last Friday.  Lymphedema issues, if any:  no    Pain issues, if any:  no   SAFETY ISSUES:  Prior radiation? Thyroid radiation - took a pill a year ago  Pacemaker/ICD? no  Possible current pregnancy?no  Is the patient on methotrexate? no  Current Complaints / other details:  Patient has CT of abd/pelvis and PET scan on 07/25/17.  Patient is here with her husband.  BP 112/77 (BP Location: Right Wrist, Patient Position: Sitting)   Pulse (!) 113   Temp 98.2 F (36.8 C) (Oral)   Ht 5' 10"  (1.778 m)   Wt 222 lb 3.2 oz (100.8 kg)   LMP 07/02/2017   SpO2 98%   BMI 31.88 kg/m    Wt Readings from Last 3 Encounters:  07/23/17 222 lb 3.2 oz (100.8 kg)  07/15/17 223 lb (101.2 kg)  07/14/17 224 lb 4.8 oz (101.7 kg)      Jacqulyn Liner, RN 07/18/2017,9:37 AM

## 2017-07-21 ENCOUNTER — Inpatient Hospital Stay: Payer: BLUE CROSS/BLUE SHIELD

## 2017-07-21 ENCOUNTER — Telehealth: Payer: Self-pay | Admitting: *Deleted

## 2017-07-21 ENCOUNTER — Telehealth: Payer: Self-pay

## 2017-07-21 DIAGNOSIS — Z171 Estrogen receptor negative status [ER-]: Principal | ICD-10-CM

## 2017-07-21 DIAGNOSIS — C50411 Malignant neoplasm of upper-outer quadrant of right female breast: Secondary | ICD-10-CM

## 2017-07-21 MED ORDER — PEGFILGRASTIM-CBQV 6 MG/0.6ML ~~LOC~~ SOSY
6.0000 mg | PREFILLED_SYRINGE | Freq: Once | SUBCUTANEOUS | Status: AC
  Administered 2017-07-21: 6 mg via SUBCUTANEOUS

## 2017-07-21 MED ORDER — PEGFILGRASTIM-CBQV 6 MG/0.6ML ~~LOC~~ SOSY
PREFILLED_SYRINGE | SUBCUTANEOUS | Status: AC
  Filled 2017-07-21: qty 0.6

## 2017-07-21 NOTE — Patient Instructions (Signed)
Pegfilgrastim injection What is this medicine? PEGFILGRASTIM (PEG fil gra stim) is a long-acting granulocyte colony-stimulating factor that stimulates the growth of neutrophils, a type of white blood cell important in the body's fight against infection. It is used to reduce the incidence of fever and infection in patients with certain types of cancer who are receiving chemotherapy that affects the bone marrow, and to increase survival after being exposed to high doses of radiation. This medicine may be used for other purposes; ask your health care provider or pharmacist if you have questions. COMMON BRAND NAME(S): Neulasta What should I tell my health care provider before I take this medicine? They need to know if you have any of these conditions: -kidney disease -latex allergy -ongoing radiation therapy -sickle cell disease -skin reactions to acrylic adhesives (On-Body Injector only) -an unusual or allergic reaction to pegfilgrastim, filgrastim, other medicines, foods, dyes, or preservatives -pregnant or trying to get pregnant -breast-feeding How should I use this medicine? This medicine is for injection under the skin. If you get this medicine at home, you will be taught how to prepare and give the pre-filled syringe or how to use the On-body Injector. Refer to the patient Instructions for Use for detailed instructions. Use exactly as directed. Tell your healthcare provider immediately if you suspect that the On-body Injector may not have performed as intended or if you suspect the use of the On-body Injector resulted in a missed or partial dose. It is important that you put your used needles and syringes in a special sharps container. Do not put them in a trash can. If you do not have a sharps container, call your pharmacist or healthcare provider to get one. Talk to your pediatrician regarding the use of this medicine in children. While this drug may be prescribed for selected conditions,  precautions do apply. Overdosage: If you think you have taken too much of this medicine contact a poison control center or emergency room at once. NOTE: This medicine is only for you. Do not share this medicine with others. What if I miss a dose? It is important not to miss your dose. Call your doctor or health care professional if you miss your dose. If you miss a dose due to an On-body Injector failure or leakage, a new dose should be administered as soon as possible using a single prefilled syringe for manual use. What may interact with this medicine? Interactions have not been studied. Give your health care provider a list of all the medicines, herbs, non-prescription drugs, or dietary supplements you use. Also tell them if you smoke, drink alcohol, or use illegal drugs. Some items may interact with your medicine. This list may not describe all possible interactions. Give your health care provider a list of all the medicines, herbs, non-prescription drugs, or dietary supplements you use. Also tell them if you smoke, drink alcohol, or use illegal drugs. Some items may interact with your medicine. What should I watch for while using this medicine? You may need blood work done while you are taking this medicine. If you are going to need a MRI, CT scan, or other procedure, tell your doctor that you are using this medicine (On-Body Injector only). What side effects may I notice from receiving this medicine? Side effects that you should report to your doctor or health care professional as soon as possible: -allergic reactions like skin rash, itching or hives, swelling of the face, lips, or tongue -dizziness -fever -pain, redness, or irritation at site   where injected -pinpoint red spots on the skin -red or dark-brown urine -shortness of breath or breathing problems -stomach or side pain, or pain at the shoulder -swelling -tiredness -trouble passing urine or change in the amount of urine Side  effects that usually do not require medical attention (report to your doctor or health care professional if they continue or are bothersome): -bone pain -muscle pain This list may not describe all possible side effects. Call your doctor for medical advice about side effects. You may report side effects to FDA at 1-800-FDA-1088. Where should I keep my medicine? Keep out of the reach of children. Store pre-filled syringes in a refrigerator between 2 and 8 degrees C (36 and 46 degrees F). Do not freeze. Keep in carton to protect from light. Throw away this medicine if it is left out of the refrigerator for more than 48 hours. Throw away any unused medicine after the expiration date. NOTE: This sheet is a summary. It may not cover all possible information. If you have questions about this medicine, talk to your doctor, pharmacist, or health care provider.  2018 [employer] (2016-04-11 12:58:03)  

## 2017-07-21 NOTE — Telephone Encounter (Signed)
Patient scheduled by Roma Kayser. Per 3/21 in basket

## 2017-07-21 NOTE — Telephone Encounter (Signed)
-----   Message from Thomasene Lot, RN sent at 07/18/2017  4:47 PM EDT ----- Regarding: Dr Jeanine Luz 1st tx f/u call 1st tx f/u call

## 2017-07-21 NOTE — Telephone Encounter (Signed)
Rebecca [name] , LPN  Saw pt today for her neulasta injection. 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 Denied nausea/vomiting/fever/diarrhea.  Her only complaint was of fatigue.. She is eating and drinking well.

## 2017-07-21 NOTE — Telephone Encounter (Signed)
Tried calling to ensure that the patient was aware of her upcoming appointment with Gen/ Florene Glen. Mailed patient a calender and letter of this added appointment. Per 3/20 in basket

## 2017-07-23 ENCOUNTER — Encounter: Payer: Self-pay | Admitting: Radiation Oncology

## 2017-07-23 ENCOUNTER — Other Ambulatory Visit: Payer: Self-pay

## 2017-07-23 ENCOUNTER — Ambulatory Visit
Admission: RE | Admit: 2017-07-23 | Discharge: 2017-07-23 | Disposition: A | Discharge status: Home / Self Care | Payer: BLUE CROSS/BLUE SHIELD | Source: Ambulatory Visit | Attending: Radiation Oncology | Admitting: Radiation Oncology

## 2017-07-23 DIAGNOSIS — I447 Left bundle-branch block, unspecified: Secondary | ICD-10-CM | POA: Diagnosis not present

## 2017-07-23 DIAGNOSIS — K5792 Diverticulitis of intestine, part unspecified, without perforation or abscess without bleeding: Secondary | ICD-10-CM | POA: Diagnosis not present

## 2017-07-23 DIAGNOSIS — E119 Type 2 diabetes mellitus without complications: Secondary | ICD-10-CM | POA: Insufficient documentation

## 2017-07-23 DIAGNOSIS — Z79899 Other long term (current) drug therapy: Secondary | ICD-10-CM | POA: Insufficient documentation

## 2017-07-23 DIAGNOSIS — C50411 Malignant neoplasm of upper-outer quadrant of right female breast: Secondary | ICD-10-CM

## 2017-07-23 DIAGNOSIS — R110 Nausea: Secondary | ICD-10-CM | POA: Diagnosis not present

## 2017-07-23 DIAGNOSIS — E039 Hypothyroidism, unspecified: Secondary | ICD-10-CM | POA: Insufficient documentation

## 2017-07-23 DIAGNOSIS — Z9013 Acquired absence of bilateral breasts and nipples: Secondary | ICD-10-CM | POA: Insufficient documentation

## 2017-07-23 DIAGNOSIS — K449 Diaphragmatic hernia without obstruction or gangrene: Secondary | ICD-10-CM | POA: Insufficient documentation

## 2017-07-23 DIAGNOSIS — Z171 Estrogen receptor negative status [ER-]: Secondary | ICD-10-CM | POA: Diagnosis not present

## 2017-07-23 DIAGNOSIS — Z809 Family history of malignant neoplasm, unspecified: Secondary | ICD-10-CM | POA: Insufficient documentation

## 2017-07-23 DIAGNOSIS — Z87891 Personal history of nicotine dependence: Secondary | ICD-10-CM | POA: Diagnosis not present

## 2017-07-23 DIAGNOSIS — F419 Anxiety disorder, unspecified: Secondary | ICD-10-CM | POA: Insufficient documentation

## 2017-07-23 DIAGNOSIS — Z806 Family history of leukemia: Secondary | ICD-10-CM | POA: Diagnosis not present

## 2017-07-23 DIAGNOSIS — K219 Gastro-esophageal reflux disease without esophagitis: Secondary | ICD-10-CM | POA: Insufficient documentation

## 2017-07-23 DIAGNOSIS — Z7984 Long term (current) use of oral hypoglycemic drugs: Secondary | ICD-10-CM | POA: Insufficient documentation

## 2017-07-23 NOTE — Progress Notes (Signed)
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 female who presents with newly diagnosed right breast cancer. The patient self palpated a lump in the right breast in early March 2019. She reports a mammogram 5 months prior that showed no evidence of disease. She presented to the ED and underwent mammogram on 07/09/17. This revealed an indeterminate 5.1 cm mass at the palpable site in the right breast. There is a probable 8 mm cyst at the 8:30 position in the right breast. There was no evidence of right axillary lymphadenopathy. Biopsy of the mass on 07/09/17 showed grade III invasive ductal carcinoma of the right breast. Receptor status was ER 0%, PR 0%, Her2 positive, and Ki-67 70%. Patient underwent MRI which showed a 4.3 cm enhancing mass within the lateral aspect of the right breast corresponding with the biopsy-proven invasive ductal carcinoma. Asymmetric lymph nodes in the right axilla were worrisome for metastatic adenopathy. The patient underwent ultrasound of the right breast and abnormal lymph nodes in the right axilla on 07/16/17. This showed no enlarged or morphologically abnormal lymph nodes in the right axilla. No evidence of malignancy within the left breast. Benign cyst within the left breast at the 5 o'clock axis measuring 2 cm.  The patient was seen by Dr. Lindi Adie on 07/14/17. The plan is for neoadjuvant chemotherapy with TCH Perjeta x 6 cycles followed by Herceptin and Perjeta maintenance for 1 year. This will be followed by bilateral mastectomies with targeted  node dissection on the right. This will then be followed by radiation therapy. The patient started chemotherapy on 07/18/17. The patient is seen on behalf of Dr. Lindi Adie to discuss the role of radiation therapy as part of her disease management.   The patient denies lymphedema or pain at this time. She reports nausea and "feeling like she had the flu" after her first round of chemotherapy. She denies nipple discharge or bleeding. She denies headaches or back pain.  PREVIOUS RADIATION THERAPY: Yes Thyroid radiation, she reports taking a pill a year ago.  PAST MEDICAL HISTORY:  has a past medical history of Anxiety, Breast cancer ([place]), Diabetes mellitus without complication ([place]), Diverticulitis, GERD (gastroesophageal reflux disease), Hiatal hernia, Hypothyroidism, and LBBB (left bundle branch block).    PAST SURGICAL HISTORY: Past Surgical History:  Procedure Laterality Date  . CESAREAN SECTION  2007/2010   x2  . LAPAROSCOPIC TUBAL LIGATION  06/12/2011   Procedure: LAPAROSCOPIC TUBAL LIGATION;  Surgeon: Daria Pastures, MD;  Location: [facility];  Service: Gynecology;  Laterality: N/A;  filshie clip  . mini tuck  2012   abdominal  . PORTACATH PLACEMENT Right 07/15/2017   Procedure: INSERTION PORT-A-CATH WITH ULTRASOUND;  Surgeon: Rolm Bookbinder, MD;  Location: [facility];  Service: General;  Laterality: Right;  . right breast cancer     upper-outer quadrant   . right ear surg  03/2008   Mohs  . TUBAL LIGATION    . US ECHOCARDIOGRAPHY  12-30-2007   EF 55-60%  . WISDOM TOOTH EXTRACTION      FAMILY HISTORY: family history includes Cancer in her  father; Diabetes in her brother and mother; Lymphoma in her father.  SOCIAL HISTORY:  reports that she quit smoking about 12 years ago. Her smoking use included cigarettes. She has a 1.50 pack-year smoking history. She has never used smokeless tobacco. She reports that she drinks alcohol. She reports that she does not use  drugs.  ALLERGIES: Patient has no known allergies.  MEDICATIONS:  Current Outpatient Medications  Medication Sig Dispense Refill  . dexamethasone (DECADRON) 4 MG tablet Take 1 tablet (4 mg total) by mouth daily. Take 1 tablet day before chemo and 1 tablet day after with food 12 tablet 0  . esomeprazole (NEXIUM) 40 MG capsule Nexium 40 mg capsule,delayed release  Take 1 capsule every day by oral route.    Marland Kitchen levothyroxine (SYNTHROID, LEVOTHROID) 137 MCG tablet Take 137 mcg by mouth daily before breakfast.    . lidocaine-prilocaine (EMLA) cream Apply to affected area once 30 g 3  . metFORMIN (GLUCOPHAGE) 500 MG tablet Take by mouth daily with breakfast.    . prochlorperazine (COMPAZINE) 10 MG tablet Take 1 tablet (10 mg total) by mouth every 6 (six) hours as needed (Nausea or vomiting). 30 tablet 1  . HYDROcodone-acetaminophen (NORCO) 10-325 MG tablet Take 1 tablet by mouth every 6 (six) hours as needed. (Patient not taking: Reported on 07/23/2017) 10 tablet 0  . LORazepam (ATIVAN) 0.5 MG tablet Take 1 tablet (0.5 mg total) by mouth at bedtime as needed for sleep. (Patient not taking: Reported on 07/23/2017) 30 tablet 0  . LORazepam (ATIVAN) 1 MG tablet Take 1 tablet (1 mg total) by mouth every 6 (six) hours as needed for anxiety. (Patient not taking: Reported on 07/23/2017) 12 tablet 0  . ondansetron (ZOFRAN) 8 MG tablet Take 1 tablet (8 mg total) by mouth 2 (two) times daily as needed (Nausea or vomiting). Begin 4 days after chemotherapy. (Patient not taking: Reported on 07/23/2017) 30 tablet 1  . propranolol (INNOPRAN XL) 120 MG 24 hr capsule Take 120 mg by mouth at bedtime.    . triamterene-hydrochlorothiazide (MAXZIDE) 75-50 MG tablet One po daily prn lower extremity edema (Patient not taking: Reported on 07/23/2017) 30 tablet 2   No current facility-administered medications for this encounter.     REVIEW OF SYSTEMS: A 10+ POINT REVIEW OF SYSTEMS WAS OBTAINED including neurology, dermatology,  psychiatry, cardiac, respiratory, lymph, extremities, GI, GU, musculoskeletal, constitutional, reproductive, HEENT. All pertinent positives are noted in the HPI. All others are negative.    PHYSICAL EXAM:  height is 5' 10"  (1.778 m) and weight is 222 lb 3.2 oz (100.8 kg). Her oral temperature is 98.2 F (36.8 C). Her blood pressure is 112/77 and her pulse is 113 (abnormal). Her oxygen saturation is 98%.  General: Alert and oriented, in no acute distress HEENT: Head is normocephalic. Extraocular movements are intact. Oropharynx is clear. Neck: Neck is supple, no palpable cervical or supraclavicular lymphadenopathy. Heart: Regular in rate and rhythm with no murmurs, rubs, or gallops. Chest: Clear to auscultation bilaterally, with no rhonchi, wheezes, or rales. Porta-cath in the right upper chest. Abdomen: Soft, nontender, nondistended, with no rigidity or guarding. Extremities: No cyanosis or edema. Lymphatics: see Neck Exam Skin: No concerning lesions. Musculoskeletal: symmetric strength and muscle tone throughout. Neurologic: Cranial nerves II through XII are grossly intact. No obvious focalities. Speech is fluent. Coordination is intact. Psychiatric: Judgment and insight are intact. Affect is appropriate. Breast: Patient has large pendulous breasts. On the left breast no mass, nipple discharge, or bleeding. On  the right breast the patient has a palpable approximately 4 x 4 mm mass in the lateral aspect of the breast. No nipple discharge or bleeding.    ECOG = 1  0 - Asymptomatic (Fully active, able to carry on all predisease activities without restriction)  1 - Symptomatic but completely ambulatory (Restricted in physically strenuous activity but ambulatory and able to carry out work of a light or sedentary nature. For example, light housework, office work)  2 - Symptomatic, <50% in bed during the day (Ambulatory and capable of all self care but unable to carry out any work activities. Up  and about more than 50% of waking hours)  3 - Symptomatic, >50% in bed, but not bedbound (Capable of only limited self-care, confined to bed or chair 50% or more of waking hours)  4 - Bedbound (Completely disabled. Cannot carry on any self-care. Totally confined to bed or chair)  5 - Death   Eustace Pen MM, Creech RH, Tormey DC, et al. [phone]). "Toxicity and response criteria of the [facility]". [place] Oncol. 5 (6): 649-55  LABORATORY DATA:  Lab Results  Component Value Date   WBC 11.1 (H) 07/18/2017   HGB 13.0 10/17/2015   HCT 36.5 07/18/2017   MCV 70.9 (L) 07/18/2017   PLT 348 07/18/2017   NEUTROABS 9.1 (H) 07/18/2017   Lab Results  Component Value Date   NA 137 07/18/2017   K 4.2 07/18/2017   CL 102 07/18/2017   CO2 24 07/18/2017   GLUCOSE 133 07/18/2017   CREATININE 1.01 07/18/2017   CALCIUM 9.7 07/18/2017      RADIOGRAPHY: Mr Breast Bilateral W Wo Contrast Inc Cad  Addendum Date: 07/16/2017   ADDENDUM REPORT: 07/16/2017 09:42 ADDENDUM: There are 4 right axillary lymph nodes with cortical thickening suspicious for metastatic nodes. Electronically Signed   By: Claudie Revering M.D.   On: 07/16/2017 09:42   Addendum Date: 07/11/2017   ADDENDUM REPORT: 07/11/2017 15:04 ADDENDUM: The patient has not had a mammogram of the left breast since 02/06/2017. Recommend screening left mammogram prior to treatment. Electronically Signed   By: Lillia Mountain M.D.   On: 07/11/2017 15:04   Result Date: 07/16/2017 CLINICAL DATA:  47 year old female with biopsy proven grade lll invasive ductal carcinoma in the 8:30 region of the right breast. LABS:  Creatinine was obtained on site at [facility] at [address] Results: Creatinine 1.0 mg/dL. EXAM: BILATERAL BREAST MRI WITH AND WITHOUT CONTRAST TECHNIQUE: Multiplanar, multisequence MR images of both breasts were obtained prior to and following the intravenous administration of 20 ml of MultiHance. THREE-DIMENSIONAL  MR IMAGE RENDERING ON INDEPENDENT WORKSTATION: Three-dimensional MR images were rendered by post-processing of the original MR data on an independent workstation. The three-dimensional MR images were interpreted, and findings are reported in the following complete MRI report for this study. Three dimensional images were evaluated at the independent DynaCad workstation COMPARISON:  Previous exam(s). FINDINGS: Breast composition: d. Extreme fibroglandular tissue. Background parenchymal enhancement: Moderate. Right breast: There is a necrotic enhancing mass in the 8:30 region of the right breast measuring 4.3 x 4.0 x 2.9 cm in transverse, anterior posterior and craniocaudal dimensions. Left breast: No mass or abnormal enhancement. Lymph nodes: There are multiple asymmetric enhancing lymph nodes in the right axilla. Some of these have mildly prominent cortices. They are suspicious for metastatic adenopathy. The largest lymph node measures 1.5 cm with cortical thickening measuring 7 mm. Ancillary findings:  None. IMPRESSION: 4.3 cm  enhancing mass in the lateral aspect of the right breast corresponding with the biopsy proven invasive ductal carcinoma. Asymmetric lymph nodes in the right axilla worrisome for metastatic adenopathy. RECOMMENDATION: Treatment planning of the known right breast cancer is recommended. If clinically indicated second-look ultrasound and ultrasound-guided core biopsy of right axillary lymph adenopathy could be performed. BI-RADS CATEGORY  6: Known biopsy-proven malignancy. Electronically Signed: By: Lillia Mountain M.D. On: 07/11/2017 14:43   Dg Chest Port 1 View  Result Date: 07/15/2017 CLINICAL DATA:  S/p insertion of port-a-cath. Hx of DM, GERD, hiatal hernia, LBBB. EXAM: PORTABLE CHEST 1 VIEW COMPARISON:  04/11/2015 FINDINGS: Patient has RIGHT-sided Port-A-Cath, tip to the superior vena cava. The heart size is normal. There is minimal LEFT LOWER lobe atelectasis. There are no focal  consolidations or pleural effusions. No pneumothorax. IMPRESSION: 1. Interval placement of RIGHT-sided Port-A-Cath.  No pneumothorax. 2. LEFT LOWER lobe atelectasis. Electronically Signed   By: Nolon Nations M.D.   On: 07/15/2017 14:01   Dg Fluoro Guide Cv Line-no Report  Result Date: 07/15/2017 Fluoroscopy was utilized by the requesting physician.  No radiographic interpretation.   US Breast Ltd Uni Left Inc Axilla  Result Date: 07/16/2017 CLINICAL DATA:  Recently diagnosed right breast cancer. Questionable abnormal lymph nodes in the right axilla seen by MRI for which patient presents today for second look ultrasound. Patient is also due for annual left breast mammogram which will also be performed today. EXAM: DIGITAL DIAGNOSTIC LEFT MAMMOGRAM WITH CAD AND TOMO ULTRASOUND BILATERAL BREAST COMPARISON:  Previous exam(s). ACR Breast Density Category d: The breast tissue is extremely dense, which lowers the sensitivity of mammography. FINDINGS: Left breast diagnostic mammogram: Scattered benign left breast calcifications, the majority of which have a layering appearance compatible with benign milk of calcium (fibrocystic change). No suspicious pleomorphic or linear branching calcifications are identified. Asymmetry within the lower outer quadrant of the left breast will be further characterized by ultrasound. No additional masses, suspicious calcifications or secondary signs of malignancy in the left breast. Mammographic images were processed with CAD. Left breast: Targeted ultrasound is performed, showing a benign simple cyst in the left breast at the 5 o'clock axis, 4 cm from the nipple, measuring 2 cm, corresponding to the mammographic finding. In retrospect, this cyst was present on the recent MRI. Right axilla: Targeted ultrasound is performed, evaluating the right axilla, showing several normal-appearing lymph nodes. No enlarged or morphologically abnormal lymph nodes are identified in the right  axilla. IMPRESSION: 1. No enlarged or morphologically abnormal lymph nodes are identified in the right axilla. 2. No evidence of malignancy within the left breast. Benign cyst within the left breast at the 5 o'clock axis measuring 2 cm. RECOMMENDATION: Per current treatment plan for patient's known right breast cancer. I have discussed the findings and recommendations with the patient. Results were also provided in writing at the conclusion of the visit. If applicable, a reminder letter will be sent to the patient regarding the next appointment. BI-RADS CATEGORY  2: Benign (for today's study). However, patient with a known biopsy-proven right breast cancer demonstrated on other recent studies. Electronically Signed   By: Franki Cabot M.D.   On: 07/16/2017 10:22   US Breast Ltd Uni Right Inc Axilla  Result Date: 07/09/2017 CLINICAL DATA:  47 year old female presenting for evaluation of a palpable lump identified on self breast exam 1 week ago. She states that this has gotten smaller in size unless firm since she first noticed. EXAM: DIGITAL DIAGNOSTIC UNILATERAL RIGHT  MAMMOGRAM WITH CAD AND TOMO RIGHT BREAST ULTRASOUND COMPARISON:  Previous exam(s). ACR Breast Density Category d: The breast tissue is extremely dense, which lowers the sensitivity of mammography. FINDINGS: A BB indicating the site of palpable concern has been placed on the upper-outer quadrant of the right breast. There are no definite suspicious mammographic findings identified deep to the palpable marker. However, the patient's breast tissue is extremely dense, and therefore ultrasound will be performed for further evaluation. Mammographic images were processed with CAD. Physical exam of the slightly lower outer quadrant of the right breast demonstrates a firm mass spanning approximately 5 cm. Ultrasound of the right breast at 8:30, 6 cm from the nipple demonstrates an irregular hypoechoic mass measuring 5.1 x 3.0 x 3.1 cm. Blood flow was seen  within the mass on color Doppler imaging. There is a near anechoic mass at 8:30, 2 cm from the nipple measuring 8 x 7 x 8 mm, favored to represent a cyst. Ultrasound of the right axilla demonstrates multiple symmetric lymph nodes. IMPRESSION: 1. There is an indeterminate 5.1 cm mass at the palpable site in the right breast. 2.  There is a probable 8 mm cyst in the right breast at 8:30. 3.  No evidence of right axillary lymphadenopathy. RECOMMENDATION: Ultrasound-guided biopsy is recommended for the right breast mass at 8:30. Aspiration of the probably benign mass at 8:30, 2 cm from the nipple can also be performed. I have discussed the findings and recommendations with the patient. Results were also provided in writing at the conclusion of the visit. If applicable, a reminder letter will be sent to the patient regarding the next appointment. BI-RADS CATEGORY  4: Suspicious. Electronically Signed   By: Ammie Ferrier M.D.   On: 07/09/2017 09:33   Mm Diag Breast Tomo Uni Left  Result Date: 07/16/2017 CLINICAL DATA:  Recently diagnosed right breast cancer. Questionable abnormal lymph nodes in the right axilla seen by MRI for which patient presents today for second look ultrasound. Patient is also due for annual left breast mammogram which will also be performed today. EXAM: DIGITAL DIAGNOSTIC LEFT MAMMOGRAM WITH CAD AND TOMO ULTRASOUND BILATERAL BREAST COMPARISON:  Previous exam(s). ACR Breast Density Category d: The breast tissue is extremely dense, which lowers the sensitivity of mammography. FINDINGS: Left breast diagnostic mammogram: Scattered benign left breast calcifications, the majority of which have a layering appearance compatible with benign milk of calcium (fibrocystic change). No suspicious pleomorphic or linear branching calcifications are identified. Asymmetry within the lower outer quadrant of the left breast will be further characterized by ultrasound. No additional masses, suspicious  calcifications or secondary signs of malignancy in the left breast. Mammographic images were processed with CAD. Left breast: Targeted ultrasound is performed, showing a benign simple cyst in the left breast at the 5 o'clock axis, 4 cm from the nipple, measuring 2 cm, corresponding to the mammographic finding. In retrospect, this cyst was present on the recent MRI. Right axilla: Targeted ultrasound is performed, evaluating the right axilla, showing several normal-appearing lymph nodes. No enlarged or morphologically abnormal lymph nodes are identified in the right axilla. IMPRESSION: 1. No enlarged or morphologically abnormal lymph nodes are identified in the right axilla. 2. No evidence of malignancy within the left breast. Benign cyst within the left breast at the 5 o'clock axis measuring 2 cm. RECOMMENDATION: Per current treatment plan for patient's known right breast cancer. I have discussed the findings and recommendations with the patient. Results were also provided in writing at the conclusion  of the visit. If applicable, a reminder letter will be sent to the patient regarding the next appointment. BI-RADS CATEGORY  2: Benign (for today's study). However, patient with a known biopsy-proven right breast cancer demonstrated on other recent studies. Electronically Signed   By: Franki Cabot M.D.   On: 07/16/2017 10:22   Mm Diag Breast Tomo Uni Right  Result Date: 07/09/2017 CLINICAL DATA:  47 year old female presenting for evaluation of a palpable lump identified on self breast exam 1 week ago. She states that this has gotten smaller in size unless firm since she first noticed. EXAM: DIGITAL DIAGNOSTIC UNILATERAL RIGHT MAMMOGRAM WITH CAD AND TOMO RIGHT BREAST ULTRASOUND COMPARISON:  Previous exam(s). ACR Breast Density Category d: The breast tissue is extremely dense, which lowers the sensitivity of mammography. FINDINGS: A BB indicating the site of palpable concern has been placed on the upper-outer  quadrant of the right breast. There are no definite suspicious mammographic findings identified deep to the palpable marker. However, the patient's breast tissue is extremely dense, and therefore ultrasound will be performed for further evaluation. Mammographic images were processed with CAD. Physical exam of the slightly lower outer quadrant of the right breast demonstrates a firm mass spanning approximately 5 cm. Ultrasound of the right breast at 8:30, 6 cm from the nipple demonstrates an irregular hypoechoic mass measuring 5.1 x 3.0 x 3.1 cm. Blood flow was seen within the mass on color Doppler imaging. There is a near anechoic mass at 8:30, 2 cm from the nipple measuring 8 x 7 x 8 mm, favored to represent a cyst. Ultrasound of the right axilla demonstrates multiple symmetric lymph nodes. IMPRESSION: 1. There is an indeterminate 5.1 cm mass at the palpable site in the right breast. 2.  There is a probable 8 mm cyst in the right breast at 8:30. 3.  No evidence of right axillary lymphadenopathy. RECOMMENDATION: Ultrasound-guided biopsy is recommended for the right breast mass at 8:30. Aspiration of the probably benign mass at 8:30, 2 cm from the nipple can also be performed. I have discussed the findings and recommendations with the patient. Results were also provided in writing at the conclusion of the visit. If applicable, a reminder letter will be sent to the patient regarding the next appointment. BI-RADS CATEGORY  4: Suspicious. Electronically Signed   By: Ammie Ferrier M.D.   On: 07/09/2017 09:33   [place] Axilla Right  Result Date: 07/17/2017 CLINICAL DATA:  Recently diagnosed right breast cancer. Questionable abnormal lymph nodes in the right axilla seen by MRI for which patient presents today for second look ultrasound. Patient is also due for annual left breast mammogram which will also be performed today. EXAM: DIGITAL DIAGNOSTIC LEFT MAMMOGRAM WITH CAD AND TOMO ULTRASOUND BILATERAL BREAST COMPARISON:   Previous exam(s). ACR Breast Density Category d: The breast tissue is extremely dense, which lowers the sensitivity of mammography. FINDINGS: Left breast diagnostic mammogram: Scattered benign left breast calcifications, the majority of which have a layering appearance compatible with benign milk of calcium (fibrocystic change). No suspicious pleomorphic or linear branching calcifications are identified. Asymmetry within the lower outer quadrant of the left breast will be further characterized by ultrasound. No additional masses, suspicious calcifications or secondary signs of malignancy in the left breast. Mammographic images were processed with CAD. Left breast: Targeted ultrasound is performed, showing a benign simple cyst in the left breast at the 5 o'clock axis, 4 cm from the nipple, measuring 2 cm, corresponding to the mammographic finding. In retrospect, this  cyst was present on the recent MRI. Right axilla: Targeted ultrasound is performed, evaluating the right axilla, showing several normal-appearing lymph nodes. No enlarged or morphologically abnormal lymph nodes are identified in the right axilla. IMPRESSION: 1. No enlarged or morphologically abnormal lymph nodes are identified in the right axilla. 2. No evidence of malignancy within the left breast. Benign cyst within the left breast at the 5 o'clock axis measuring 2 cm. RECOMMENDATION: Per current treatment plan for patient's known right breast cancer. I have discussed the findings and recommendations with the patient. Results were also provided in writing at the conclusion of the visit. If applicable, a reminder letter will be sent to the patient regarding the next appointment. BI-RADS CATEGORY  2: Benign (for today's study). However, patient with a known biopsy-proven right breast cancer demonstrated on other recent studies. Electronically Signed   By: Franki Cabot M.D.   On: 07/16/2017 10:22   Mm Clip Placement Right  Result Date:  07/09/2017 CLINICAL DATA:  Post biopsy mammogram of the right breast for clip placement. EXAM: DIAGNOSTIC RIGHT MAMMOGRAM POST ULTRASOUND BIOPSY COMPARISON:  Previous exam(s). FINDINGS: Mammographic images were obtained following ultrasound guided biopsy of a mass in the right breast. The ribbon shaped biopsy marking clip is well positioned at the site of the mass in the lower outer quadrant of the right breast. IMPRESSION: Appropriate positioning of the ribbon shaped biopsy marking clip in the lower outer quadrant of the right breast. Final Assessment: Post Procedure Mammograms for Marker Placement Electronically Signed   By: Ammie Ferrier M.D.   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 This was found to be concordant by Dr. Ammie Ferrier. Pathology results were discussed with the patient by telephone. The patient reported doing well after the biopsy with tenderness at the site. Post biopsy instructions and care were reviewed and questions were answered. The patient was encouraged to call [facility] for any additional concerns. Surgical consultation has been arranged with Dr. Rolm Bookbinder, per patient request, at [facility] on July 15, 2017. Recommendation for bilateral breast MRI due to extreme density. Pathology results reported by Terie Purser, RN on 07/10/2017. Electronically Signed   By: Ammie Ferrier M.D.   On: 07/10/2017 09:50   Result Date: 07/10/2017 CLINICAL DATA:  47 year old female presenting for ultrasound-guided biopsy of a palpable mass in the right breast 830. EXAM: ULTRASOUND GUIDED RIGHT BREAST CORE NEEDLE BIOPSY COMPARISON:  Previous exam(s). FINDINGS: I met with the patient and we discussed the procedure of ultrasound-guided biopsy,  including benefits and alternatives. We discussed the high likelihood of a successful procedure. We discussed the risks of the procedure, including infection, bleeding, tissue injury, clip migration, and inadequate sampling. Informed written consent was given. The usual time-out protocol was performed immediately prior to the procedure. Lesion quadrant: Lower outer quadrant Using sterile technique and 1% Lidocaine as local anesthetic, under direct ultrasound visualization, a 14 gauge spring-loaded device was used to perform biopsy of a mass in the right breast at 8:30 using an inferior approach. Though this was biopsied as one mass, 2 cores each were taken from the lateral, medial and central portion of the mass. At the conclusion of the procedure a ribbon shaped tissue marker clip was deployed into the biopsy cavity.  Follow up 2 view mammogram was performed and dictated separately. Aspiration was to be performed for the probable cyst at 830, closer to the nipple, however it ruptured when pierced by the needle for numbing. IMPRESSION: Ultrasound guided biopsy of a right breast mass at 8:30. No apparent complications. Electronically Signed: By: Ammie Ferrier M.D. 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 We discussed the general indication for post-mastectomy radiation therapy with the patient. That includes lymph node positivity or T3 lesion. The patient does not exactly fit these criteria; however, she does have a large clinically T2 tumor which is high grade and ER/PR-. The patient is premenopausal and according to NCCN guidelines, post-mastectomy radiation therapy should be considered for this patient. The patient may turn out to be node positive at the time of her surgery. This would certainly qualify her for post-mastectomy radiation treatments. The patient is very comfortable  proceeding with mastectomy as part of her treatment and does not wish to consider breast conserving surgery in light of her particularly dense breast tissue noted on imaging studies. This would make a follow-up after breast conserving surgery difficult. The patient is interested in immediate reconstruction at the time of her surgery. I recommended she discuss this with Dr. Donne Hazel and plastic surgery.  We discussed the course of treatment, side effects, and potential long term toxicities with the patient. She appears to understand and wishes to proceed with treatment. Anticipate treatment to the right chest wall and axillary region.  PLAN: Patient will return for follow-up after surgery for re-evaluation. Anticipate treatment to begin 6 weeks post-op.      ------------------------------------------------  Blair Promise, PhD, MD  This document serves as a record of services personally performed by Gery Pray, MD. It was created on his behalf by Bethann Humble, a trained medical scribe. The creation of this record is based on the scribe's personal observations and the provider's statements to them. This document has been checked and approved by the attending provider.

## 2017-07-24 ENCOUNTER — Telehealth: Payer: Self-pay | Admitting: Hematology and Oncology

## 2017-07-24 ENCOUNTER — Encounter: Payer: Self-pay | Admitting: *Deleted

## 2017-07-24 NOTE — Telephone Encounter (Signed)
07/24/2017 @ 2:28 pm, spoke with patient and informed FMLA was successfully faxed to [employer] at [phone]. Pt decline to receive a copy.

## 2017-07-25 ENCOUNTER — Inpatient Hospital Stay: Payer: BLUE CROSS/BLUE SHIELD

## 2017-07-25 ENCOUNTER — Encounter (HOSPITAL_COMMUNITY)
Admission: RE | Admit: 2017-07-25 | Discharge: 2017-07-25 | Disposition: A | Discharge status: Home / Self Care | Payer: BLUE CROSS/BLUE SHIELD | Source: Ambulatory Visit | Attending: Hematology and Oncology | Admitting: Hematology and Oncology

## 2017-07-25 ENCOUNTER — Inpatient Hospital Stay (HOSPITAL_BASED_OUTPATIENT_CLINIC_OR_DEPARTMENT_OTHER): Payer: BLUE CROSS/BLUE SHIELD | Admitting: Hematology and Oncology

## 2017-07-25 ENCOUNTER — Ambulatory Visit (HOSPITAL_COMMUNITY)
Admission: RE | Admit: 2017-07-25 | Discharge: 2017-07-25 | Disposition: A | Discharge status: Home / Self Care | Payer: BLUE CROSS/BLUE SHIELD | Source: Ambulatory Visit | Attending: Hematology and Oncology | Admitting: Hematology and Oncology

## 2017-07-25 ENCOUNTER — Encounter: Payer: Self-pay | Admitting: *Deleted

## 2017-07-25 VITALS — BP 119/76 | HR 76 | Temp 98.7°F | Resp 20 | Ht 70.0 in | Wt 222.9 lb

## 2017-07-25 DIAGNOSIS — C50511 Malignant neoplasm of lower-outer quadrant of right female breast: Secondary | ICD-10-CM | POA: Insufficient documentation

## 2017-07-25 DIAGNOSIS — C50411 Malignant neoplasm of upper-outer quadrant of right female breast: Secondary | ICD-10-CM

## 2017-07-25 DIAGNOSIS — D72819 Decreased white blood cell count, unspecified: Secondary | ICD-10-CM

## 2017-07-25 DIAGNOSIS — Z79899 Other long term (current) drug therapy: Secondary | ICD-10-CM

## 2017-07-25 DIAGNOSIS — R5383 Other fatigue: Secondary | ICD-10-CM | POA: Diagnosis not present

## 2017-07-25 DIAGNOSIS — D509 Iron deficiency anemia, unspecified: Secondary | ICD-10-CM | POA: Diagnosis not present

## 2017-07-25 DIAGNOSIS — Z171 Estrogen receptor negative status [ER-]: Secondary | ICD-10-CM

## 2017-07-25 DIAGNOSIS — E038 Other specified hypothyroidism: Secondary | ICD-10-CM

## 2017-07-25 DIAGNOSIS — R911 Solitary pulmonary nodule: Secondary | ICD-10-CM | POA: Insufficient documentation

## 2017-07-25 LAB — CMP (CANCER CENTER ONLY)
ALT: 13 U/L (ref 0–55)
AST: 12 U/L (ref 5–34)
Albumin: 3.3 g/dL — ABNORMAL LOW (ref 3.5–5.0)
Alkaline Phosphatase: 55 U/L (ref 40–150)
Anion gap: 6 (ref 3–11)
BUN: 10 mg/dL (ref 7–26)
CALCIUM: 9 mg/dL (ref 8.4–10.4)
CO2: 27 mmol/L (ref 22–29)
Chloride: 106 mmol/L (ref 98–109)
Creatinine: 0.88 mg/dL (ref 0.60–1.10)
GFR, Estimated: >60 mL/min (ref >60)
Glucose, Bld: 120 mg/dL (ref 70–140)
Potassium: 4.4 mmol/L (ref 3.5–5.1)
SODIUM: 139 mmol/L (ref 136–145)
Total Bilirubin: <0.2 mg/dL — ABNORMAL LOW (ref 0.2–1.2)
Total Protein: 6.4 g/dL (ref 6.4–8.3)

## 2017-07-25 LAB — CBC WITH DIFFERENTIAL (CANCER CENTER ONLY)
BASOS PCT: 1 %
Basophils Absolute: 0 10*3/uL (ref 0.0–0.1)
Eosinophils Absolute: 0 10*3/uL (ref 0.0–0.5)
Eosinophils Relative: 1 %
HEMATOCRIT: 35.2 % (ref 34.8–46.6)
HEMOGLOBIN: 10.6 g/dL — AB (ref 11.6–15.9)
Lymphocytes Relative: 44 %
Lymphs Abs: 1.4 10*3/uL (ref 0.9–3.3)
MCH: 22 pg — ABNORMAL LOW (ref 25.1–34.0)
MCHC: 30.1 g/dL — AB (ref 31.5–36.0)
MCV: 73 fL — ABNORMAL LOW (ref 79.5–101.0)
MONOS PCT: 24 %
Monocytes Absolute: 0.7 10*3/uL (ref 0.1–0.9)
NEUTROS ABS: 0.9 10*3/uL — AB (ref 1.5–6.5)
NEUTROS PCT: 30 %
Platelet Count: 246 10*3/uL (ref 145–400)
RBC: 4.82 MIL/uL (ref 3.70–5.45)
RDW: 16.6 % — ABNORMAL HIGH (ref 11.2–14.5)
WBC Count: 3 10*3/uL — ABNORMAL LOW (ref 3.9–10.3)

## 2017-07-25 IMAGING — CT CT ABD-PELV W/ CM
2 of 5 series · 15 of 31 positions shown, 18 images · IV contrast (ISOVUE 300)
Comparison: CT AP [DATE] and CT chest [DATE].

CLINICAL DATA: New diagnosis of right breast cancer.

EXAM:
CT CHEST, ABDOMEN, AND PELVIS WITH CONTRAST
TECHNIQUE: Multidetector CT imaging of the chest, abdomen and pelvis was
performed following the standard protocol during bolus
administration of intravenous contrast.
CONTRAST:  100mL [LX] IOPAMIDOL ([LX]) INJECTION 61%

[Series 2: cap with · axial · 0.83mm/px · z∈[-665,-110]mm · 10 of 137 slices shown, 13 images]
[im 13/137  mediastinal]
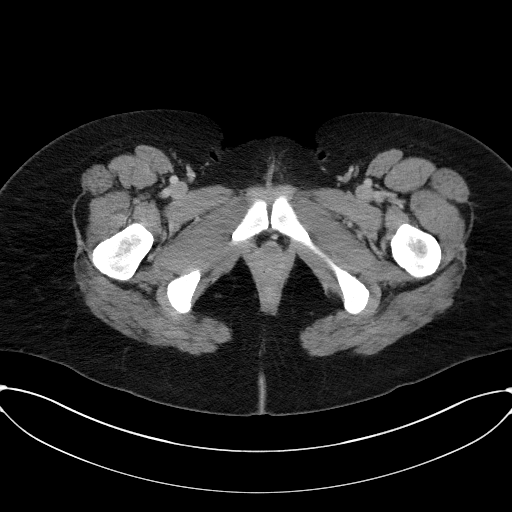
[im 13/137  lung]
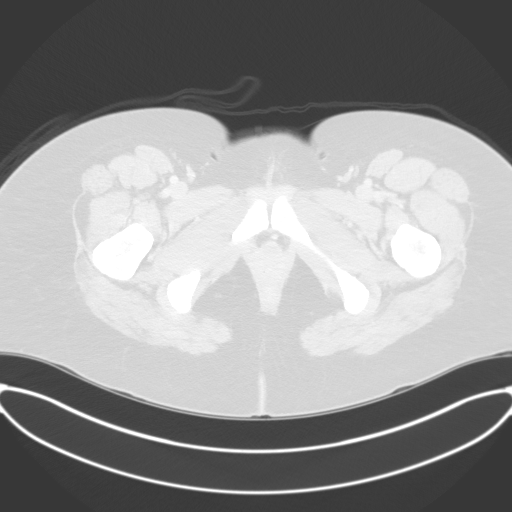
[im 25/137  lung]
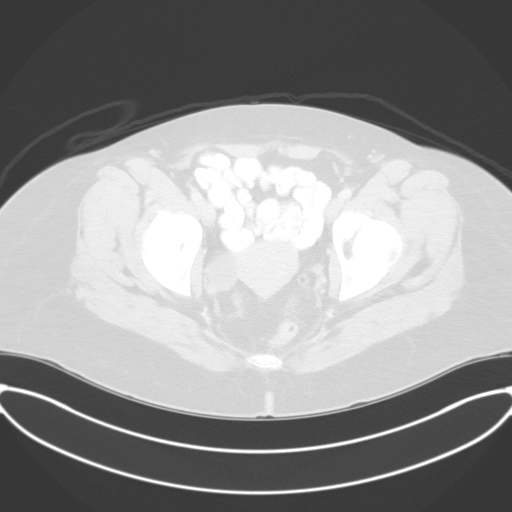
[im 38/137  lung]
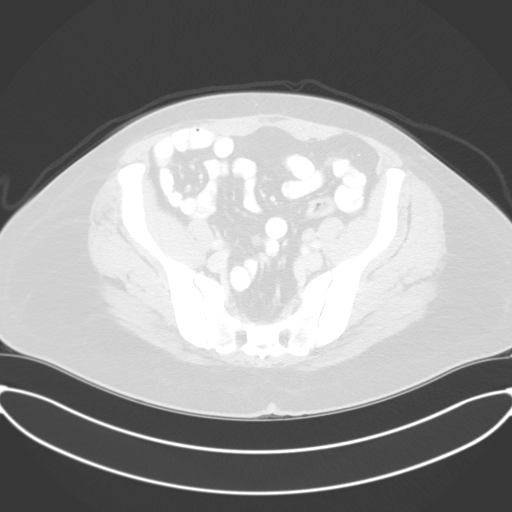
[im 50/137  lung]
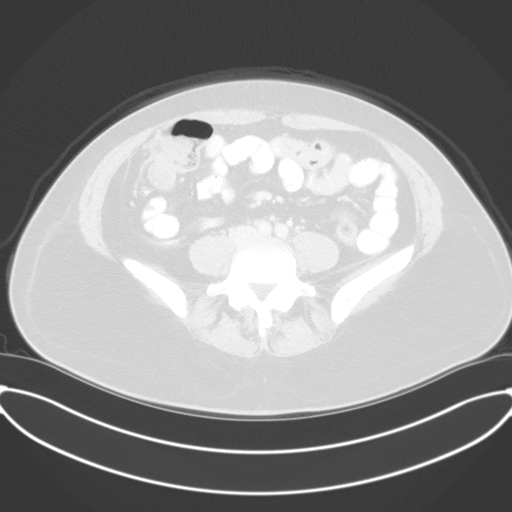
[im 62/137  mediastinal]
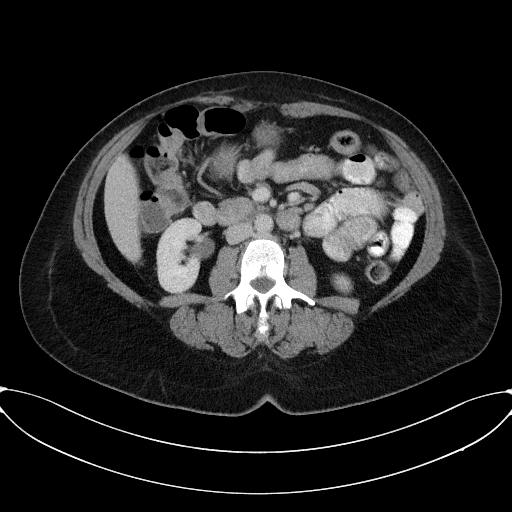
[im 62/137  lung]
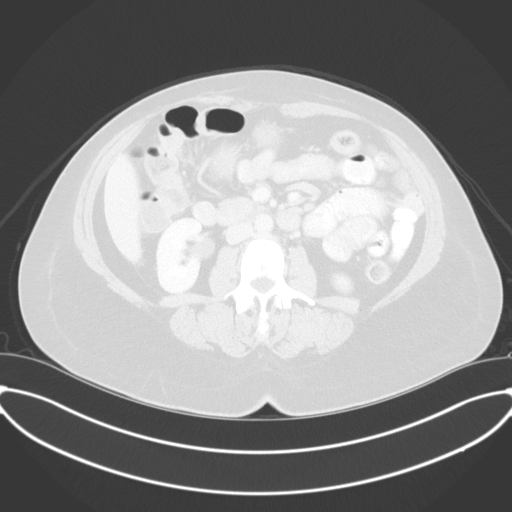
[im 75/137  lung]
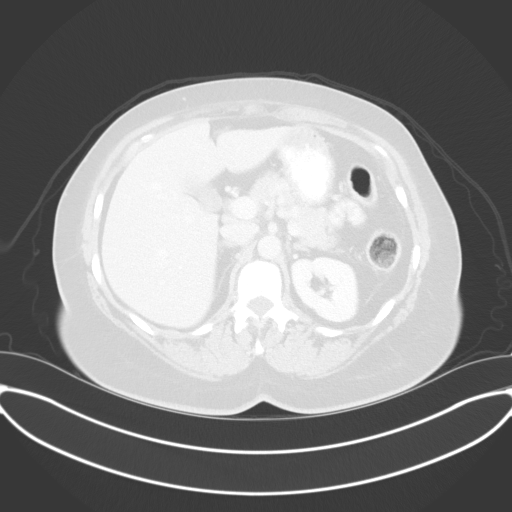
[im 87/137  lung]
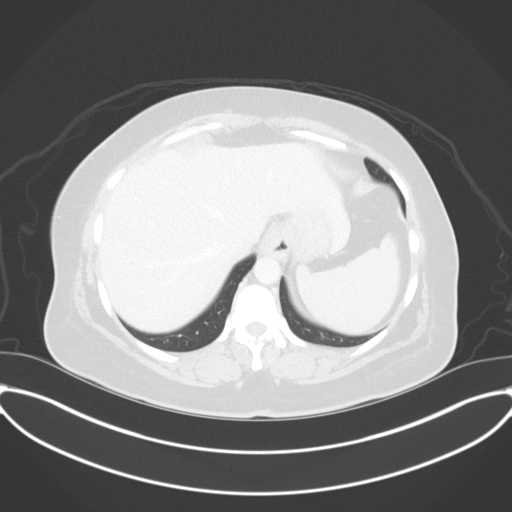
[im 99/137  lung]
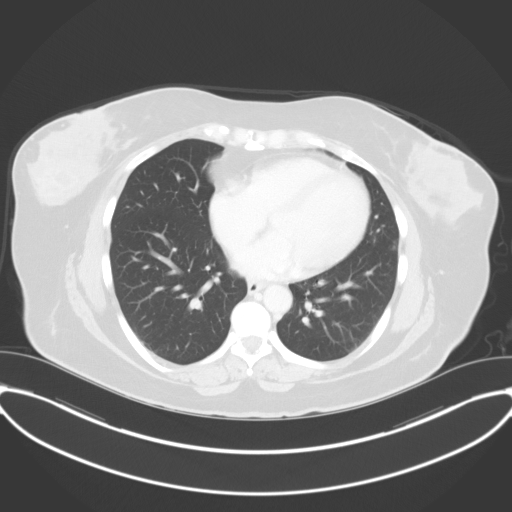
[im 112/137  mediastinal]
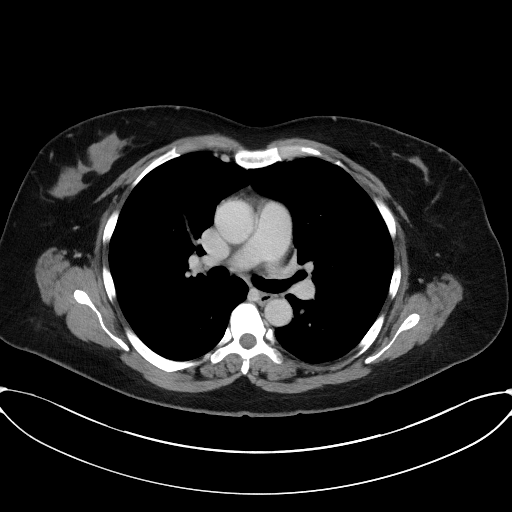
[im 112/137  lung]
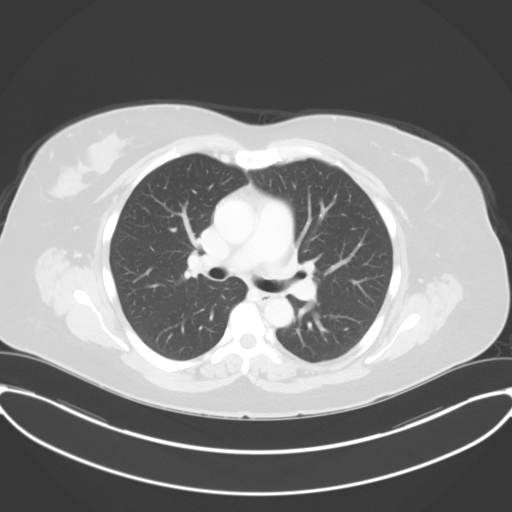
[im 124/137  lung]
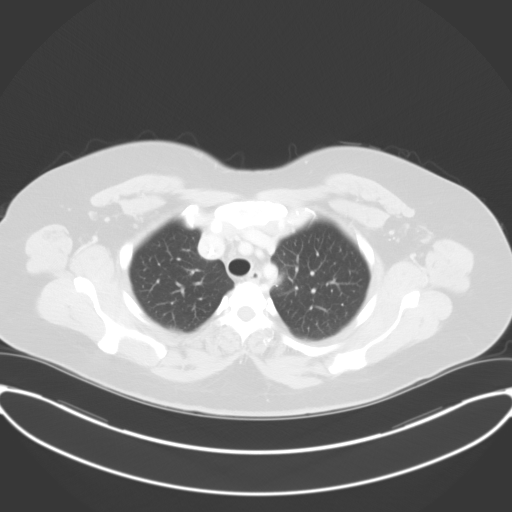

[Series 4: lung · axial · 0.83mm/px · z∈[-319,-171]mm · 5 of 150 slices shown]
[im 13/150  lung]
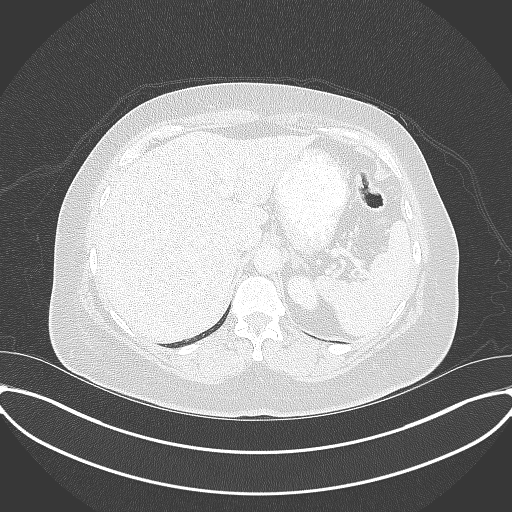
[im 38/150  lung]
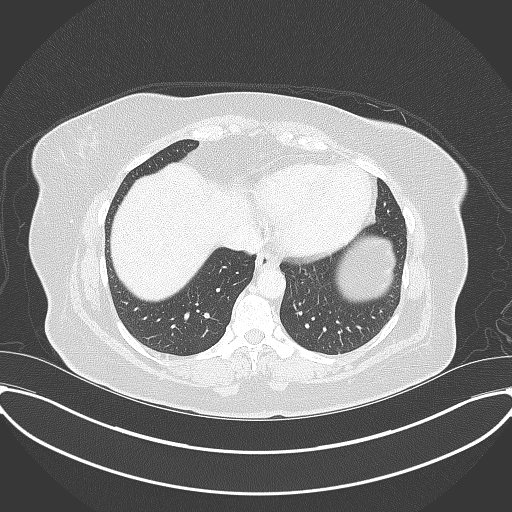
[im 50/150  lung]
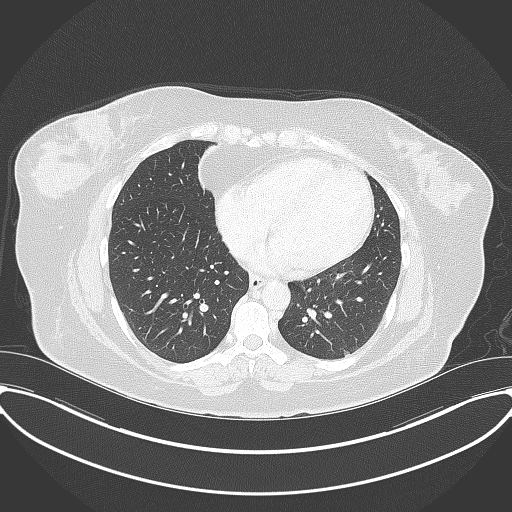
[im 63/150  lung]
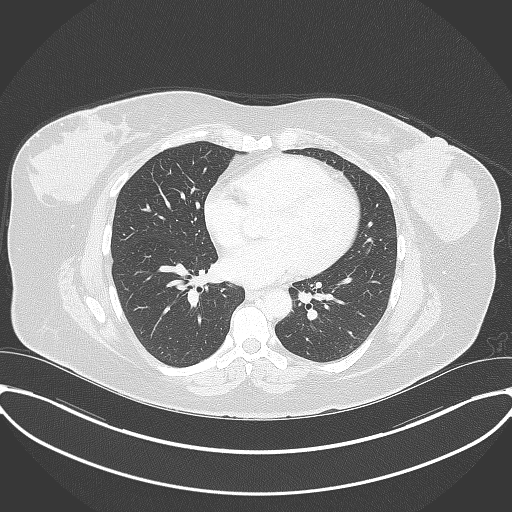
[im 87/150  lung]
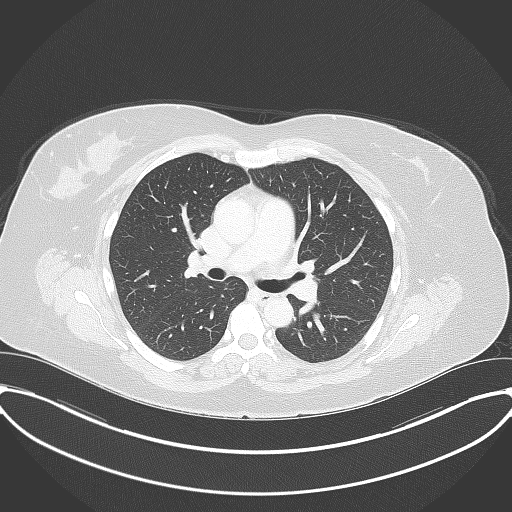

[15 of 31 positions shown; findings below may reference images not displayed]

FINDINGS: CT CHEST FINDINGS

Cardiovascular: The heart size appears within normal limits. No
pericardial effusion. Mild aortic atherosclerosis.

Mediastinum/Nodes: No enlarged mediastinal, hilar, or axillary lymph
nodes. Thyroid gland, trachea, and esophagus demonstrate no
significant findings.

No enlarged supraclavicular or axillary lymph nodes.

Lungs/Pleura: No pleural effusion. Subpleural nodule within the
right upper lobe is unchanged measuring 3 mm, image 72/4. no
suspicious pulmonary nodules identified.

Musculoskeletal: The enhancing mass within the lateral right breast
with surgical clips is identified, image 39/2. No suspicious bone
lesions.

CT ABDOMEN PELVIS FINDINGS

Hepatobiliary: No focal liver abnormality is seen. No gallstones,
gallbladder wall thickening, or biliary dilatation.

Pancreas: Unremarkable. No pancreatic ductal dilatation or
surrounding inflammatory changes.

Spleen: Normal in size without focal abnormality.

Adrenals/Urinary Tract: The adrenal glands are normal. Unremarkable
appearance of both kidneys. Urinary bladder is normal.

Stomach/Bowel: Stomach is within normal limits. Appendix appears
normal. No evidence of bowel wall thickening, distention, or
inflammatory changes. The appendix is visualized and appears normal.

Vascular/Lymphatic: Normal appearance of the abdominal aorta. No
upper abdominal adenopathy. No pelvic or inguinal adenopathy.

Reproductive: Uterus normal. Cyst in right ovary is noted measuring
2.8 cm. Status post tubal ligation.

Other: No free fluid or fluid collections within the abdomen or
pelvis.

Musculoskeletal: No acute or significant osseous findings.
IMPRESSION: 1. Enhancing mass within the lateral right breast is again noted,
best demonstrated on MR from [DATE].
2. No evidence for metastatic disease.
3. Stable 3 mm subpleural nodule within the right upper lobe
compared with [DATE] compatible with a benign abnormality.

## 2017-07-25 IMAGING — NM NM BONE WHOLE BODY
2 series · 2 of 2 positions shown · non-contrast
Comparison: None.

CLINICAL DATA: Recent breast cancer diagnosis. Evaluate for bone
metastasis.

EXAM:
NUCLEAR MEDICINE WHOLE BODY BONE SCAN
TECHNIQUE: Whole body anterior and posterior images were obtained approximately
3 hours after intravenous injection of radiopharmaceutical.
RADIOPHARMACEUTICALS:  20.3 mCi [CI] MDP IV

[Series 1: whole body · 2.66mm/px · 1 of 1 slices shown (1 of 2)]
[im 1/1]
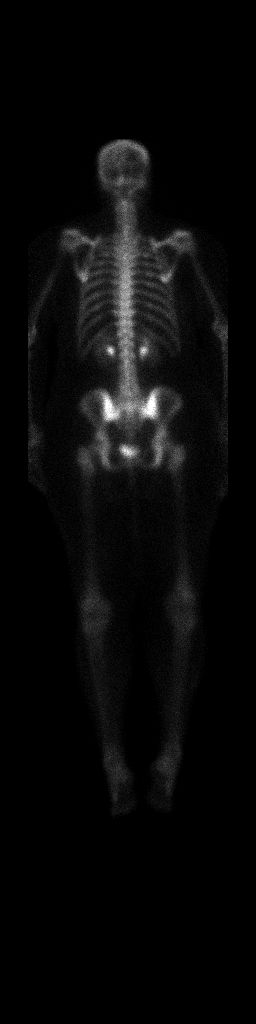

[Series 1: whole body · 2.66mm/px · 1 of 1 slices shown (2 of 2)]
[im 1/1]
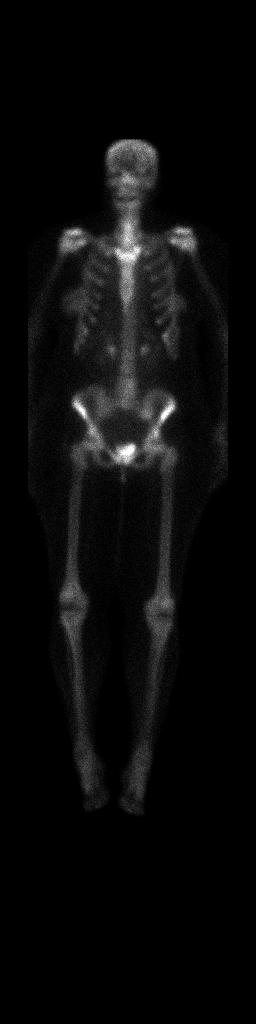

[2 of 2 positions shown; findings below may reference images not displayed]

FINDINGS: No abnormal radiotracer uptake to suggest osseous metastasis.
Expected radiotracer uptake within the right breast corresponding to
known breast cancer.

Subtle radiotracer uptake is present within the skull, suspected
hyperostosis frontalis.
IMPRESSION: 1. No evidence of osseous metastasis.
2. Symmetric radiotracer uptake within the skull suggesting
hyperostosis frontalis. Recommend confirmation with plain film of
the skull.

## 2017-07-25 MED ORDER — IOPAMIDOL (ISOVUE-300) INJECTION 61%
INTRAVENOUS | Status: AC
  Filled 2017-07-25: qty 100

## 2017-07-25 MED ORDER — IOPAMIDOL (ISOVUE-300) INJECTION 61%
100.0000 mL | Freq: Once | INTRAVENOUS | Status: AC | PRN
  Administered 2017-07-25: 100 mL via INTRAVENOUS

## 2017-07-25 MED ORDER — TECHNETIUM TC 99M MEDRONATE IV KIT
25.0000 | PACK | Freq: Once | INTRAVENOUS | Status: AC | PRN
  Administered 2017-07-25: 20.3 via INTRAVENOUS

## 2017-07-25 MED ORDER — MAGIC MOUTHWASH W/LIDOCAINE: 5.0000 mL | Freq: Three times a day (TID) | ORAL | 0 refills | Status: DC | PRN

## 2017-07-25 NOTE — Assessment & Plan Note (Signed)
07/09/2017:Patient palpated a right breast mass which was evaluated by mammogram and ultrasound and a breast MRI which revealed 4.5 x 4 cm necrotic tumor with suspicious multiple right axillary lymph nodes; biopsy revealed IDC grade 3 ER 0%, PR 0%, HER-2 positive ratio 3.08, Ki-67 70%, T2NX stage IIa/IIb  Recommendation based on multidisciplinary tumor board: 1. Neoadjuvant chemotherapy with TCH Perjeta 6 cycles followed by Herceptin and Perjeta maintenance for 1 year 2. Followed by bilateral mastectomies with targeted node dissection on the right  3. Followed by adjuvant radiation therapy  ----------------------------------------------------------------------------- Current treatment: Cycle 1 day 8 TCH Perjeta Chemo toxicities:  Return to clinic in 2 weeks for cycle 2

## 2017-07-25 NOTE — Progress Notes (Signed)
Patient Care Team: Deland Pretty, MD as PCP - General (Internal Medicine) Baxley, Cresenciano Lick, MD (Internal Medicine)  DIAGNOSIS:  Encounter Diagnoses  Name Primary?  . Malignant neoplasm of upper-outer quadrant of right breast in female, estrogen receptor negative ([place])   . Other specified hypothyroidism Yes  . Microcytic anemia     SUMMARY OF ONCOLOGIC HISTORY:   Malignant neoplasm of upper-outer quadrant of right breast in female, estrogen receptor negative ([place])   07/09/2017 Initial Diagnosis    Patient palpated a right breast mass which was evaluated by mammogram and ultrasound and a breast MRI which revealed 4.5 x 4 cm necrotic tumor with suspicious multiple right axillary lymph nodes; biopsy revealed IDC grade 3 ER 0%, PR 0%, HER-2 positive ratio 3.08, Ki-67 70%, T2NX stage IIa/IIb      07/18/2017 -  Neo-Adjuvant Chemotherapy    TCH Perjeta x6 cycles followed by Herceptin Perjeta maintenance       CHIEF COMPLIANT: Cycle 1 day 8 TCH Perjeta  INTERVAL HISTORY: Rebecca [name] is a 47 year old with above-mentioned history of HER-2 positive right breast cancer who is here first toxicity evaluation after receiving first cycle of chemotherapy last week.  Overall she tolerated chemotherapy reasonably well.  She did have fatigue and nausea.  She denies any fevers or chills.   REVIEW OF SYSTEMS:   Constitutional: Denies fevers, chills or abnormal weight loss Eyes: Denies blurriness of vision Ears, nose, mouth, throat, and face: Denies mucositis or sore throat Respiratory: Denies cough, dyspnea or wheezes Cardiovascular: Denies palpitation, chest discomfort Gastrointestinal:  Denies nausea, heartburn or change in bowel habits Skin: Denies abnormal skin rashes Lymphatics: Denies new lymphadenopathy or easy bruising Neurological:Denies numbness, tingling or new weaknesses Behavioral/Psych: Mood is stable, no new changes  Extremities: No lower extremity edema All other systems were  reviewed with the patient and are negative.  I have reviewed the past medical history, past surgical history, social history and family history with the patient and they are unchanged from previous note.  ALLERGIES:  has No Known Allergies.  MEDICATIONS:  Current Outpatient Medications  Medication Sig Dispense Refill  . dexamethasone (DECADRON) 4 MG tablet Take 1 tablet (4 mg total) by mouth daily. Take 1 tablet day before chemo and 1 tablet day after with food 12 tablet 0  . esomeprazole (NEXIUM) 40 MG capsule Nexium 40 mg capsule,delayed release  Take 1 capsule every day by oral route.    Marland Kitchen HYDROcodone-acetaminophen (NORCO) 10-325 MG tablet Take 1 tablet by mouth every 6 (six) hours as needed. (Patient not taking: Reported on 07/23/2017) 10 tablet 0  . levothyroxine (SYNTHROID, LEVOTHROID) 137 MCG tablet Take 137 mcg by mouth daily before breakfast.    . lidocaine-prilocaine (EMLA) cream Apply to affected area once 30 g 3  . LORazepam (ATIVAN) 0.5 MG tablet Take 1 tablet (0.5 mg total) by mouth at bedtime as needed for sleep. (Patient not taking: Reported on 07/23/2017) 30 tablet 0  . LORazepam (ATIVAN) 1 MG tablet Take 1 tablet (1 mg total) by mouth every 6 (six) hours as needed for anxiety. (Patient not taking: Reported on 07/23/2017) 12 tablet 0  . magic mouthwash w/lidocaine SOLN Take 5 mLs by mouth 3 (three) times daily as needed for mouth pain. 200 mL 0  . metFORMIN (GLUCOPHAGE) 500 MG tablet Take by mouth daily with breakfast.    . ondansetron (ZOFRAN) 8 MG tablet Take 1 tablet (8 mg total) by mouth 2 (two) times daily as needed (Nausea or  vomiting). Begin 4 days after chemotherapy. (Patient not taking: Reported on 07/23/2017) 30 tablet 1  . prochlorperazine (COMPAZINE) 10 MG tablet Take 1 tablet (10 mg total) by mouth every 6 (six) hours as needed (Nausea or vomiting). 30 tablet 1  . propranolol (INNOPRAN XL) 120 MG 24 hr capsule Take 120 mg by mouth at bedtime.    .  triamterene-hydrochlorothiazide (MAXZIDE) 75-50 MG tablet One po daily prn lower extremity edema (Patient not taking: Reported on 07/23/2017) 30 tablet 2   No current facility-administered medications for this visit.    Facility-Administered Medications Ordered in Other Visits  Medication Dose Route Frequency Provider Last Rate Last Dose  . iopamidol (ISOVUE-300) 61 % injection             PHYSICAL EXAMINATION: ECOG PERFORMANCE STATUS: 1 - Symptomatic but completely ambulatory  Vitals:   07/25/17 0952  BP: 119/76  Pulse: 76  Resp: 20  Temp: 98.7 F (37.1 C)  SpO2: 100%   Filed Weights   07/25/17 0952  Weight: 222 lb 14.4 oz (101.1 kg)    GENERAL:alert, no distress and comfortable SKIN: skin color, texture, turgor are normal, no rashes or significant lesions EYES: normal, Conjunctiva are pink and non-injected, sclera clear OROPHARYNX:no exudate, no erythema and lips, buccal mucosa, and tongue normal  NECK: supple, thyroid normal size, non-tender, without nodularity LYMPH:  no palpable lymphadenopathy in the cervical, axillary or inguinal LUNGS: clear to auscultation and percussion with normal breathing effort HEART: regular rate & rhythm and no murmurs and no lower extremity edema ABDOMEN:abdomen soft, non-tender and normal bowel sounds MUSCULOSKELETAL:no cyanosis of digits and no clubbing  NEURO: alert & oriented x 3 with fluent speech, no focal motor/sensory deficits EXTREMITIES: No lower extremity edema   LABORATORY DATA:  I have reviewed the data as listed CMP Latest Ref Rng & Units 07/25/2017 07/18/2017 10/17/2015  Glucose 70 - 140 mg/dL 120 133 93  BUN 7 - 26 mg/dL _0 Creatinine 0.60 - 1.10 mg/dL 0.88 1.01 0.77  Sodium 136 - 145 mmol/L 139 137 138  Potassium 3.5 - 5.1 mmol/L 4.4 4.2 4.6  Chloride 98 - 109 mmol/L 106 102 103  CO2 22 - 29 mmol/L _1 Calcium 8.4 - 10.4 mg/dL 9.0 9.7 9.2  Total Protein 6.4 - 8.3 g/dL 6.4 7.6 6.6  Total Bilirubin 0.2 - 1.2  mg/dL <0.2(L) 0.2 0.4  Alkaline Phos 40 - 150 U/L 55 60 40  AST 5 - 34 U/L _2 ALT 0 - 55 U/L _3 Lab Results  Component Value Date   WBC 3.0 (L) 07/25/2017   HGB 13.0 10/17/2015   HCT 35.2 07/25/2017   MCV 73.0 (L) 07/25/2017   PLT 246 07/25/2017   NEUTROABS 0.9 (L) 07/25/2017    ASSESSMENT & PLAN:  Malignant neoplasm of upper-outer quadrant of right breast in female, estrogen receptor negative ([place]) 07/09/2017:Patient palpated a right breast mass which was evaluated by mammogram and ultrasound and a breast MRI which revealed 4.5 x 4 cm necrotic tumor with suspicious multiple right axillary lymph nodes; biopsy revealed IDC grade 3 ER 0%, PR 0%, HER-2 positive ratio 3.08, Ki-67 70%, T2NX stage IIa/IIb  Recommendation based on multidisciplinary tumor board: 1. Neoadjuvant chemotherapy with TCH Perjeta 6 cycles followed by Herceptin and Perjeta maintenance for 1 year 2. Followed by bilateral mastectomies with targeted node dissection on the right  3. Followed by adjuvant radiation therapy  ----------------------------------------------------------------------------- Current  treatment: Cycle 1 day 8 TCH Perjeta Chemo toxicities: 1.  Neulasta related bone pain: Instructed her to take Tylenol or Motrin with the next cycle 2. throat pain: Due to mucositis sent Magic mouthwash 3.  Leukopenia is expected: No change in the dosing 4.  Microcytic anemia: We will check iron studies with the next chemotherapy and see if she needs IV iron treatment 5.  Fatigue: I suspect due to hypothyroidism: We will check TSH and thyroid studies  Return to clinic in 2 weeks for cycle 2  Orders Placed This Encounter  Procedures  . Thyroid Panel With TSH    Standing Status:   Future    Standing Expiration Date:   07/25/2018  . Iron and TIBC    Standing Status:   Future    Standing Expiration Date:   07/25/2018  . Ferritin    Standing Status:   Future    Standing Expiration Date:   07/25/2018     The patient has a good understanding of the overall plan. she agrees with it. she will call with any problems that may develop before the next visit here.   Harriette Ohara, MD 07/25/17

## 2017-07-29 ENCOUNTER — Encounter: Payer: Self-pay | Admitting: *Deleted

## 2017-08-08 ENCOUNTER — Encounter: Payer: Self-pay | Admitting: *Deleted

## 2017-08-08 ENCOUNTER — Inpatient Hospital Stay: Payer: BLUE CROSS/BLUE SHIELD | Attending: Hematology and Oncology

## 2017-08-08 ENCOUNTER — Inpatient Hospital Stay: Payer: BLUE CROSS/BLUE SHIELD

## 2017-08-08 ENCOUNTER — Encounter: Payer: Self-pay | Admitting: Adult Health

## 2017-08-08 ENCOUNTER — Encounter: Payer: Self-pay | Admitting: Genetic Counselor

## 2017-08-08 ENCOUNTER — Inpatient Hospital Stay (HOSPITAL_BASED_OUTPATIENT_CLINIC_OR_DEPARTMENT_OTHER): Payer: BLUE CROSS/BLUE SHIELD | Admitting: Adult Health

## 2017-08-08 ENCOUNTER — Inpatient Hospital Stay (HOSPITAL_BASED_OUTPATIENT_CLINIC_OR_DEPARTMENT_OTHER): Payer: BLUE CROSS/BLUE SHIELD | Admitting: Genetic Counselor

## 2017-08-08 VITALS — BP 124/74 | HR 64 | Temp 98.6°F | Resp 18 | Ht 70.0 in | Wt 225.8 lb

## 2017-08-08 DIAGNOSIS — E038 Other specified hypothyroidism: Secondary | ICD-10-CM

## 2017-08-08 DIAGNOSIS — Z5111 Encounter for antineoplastic chemotherapy: Secondary | ICD-10-CM | POA: Diagnosis not present

## 2017-08-08 DIAGNOSIS — Z95828 Presence of other vascular implants and grafts: Secondary | ICD-10-CM

## 2017-08-08 DIAGNOSIS — D509 Iron deficiency anemia, unspecified: Secondary | ICD-10-CM

## 2017-08-08 DIAGNOSIS — C50411 Malignant neoplasm of upper-outer quadrant of right female breast: Secondary | ICD-10-CM

## 2017-08-08 DIAGNOSIS — Z171 Estrogen receptor negative status [ER-]: Secondary | ICD-10-CM | POA: Insufficient documentation

## 2017-08-08 DIAGNOSIS — Z79899 Other long term (current) drug therapy: Secondary | ICD-10-CM | POA: Diagnosis not present

## 2017-08-08 LAB — CBC WITH DIFFERENTIAL (CANCER CENTER ONLY)
Basophils Absolute: 0 10*3/uL (ref 0.0–0.1)
Basophils Relative: 0 %
EOS PCT: 0 %
Eosinophils Absolute: 0 10*3/uL (ref 0.0–0.5)
HCT: 32.4 % — ABNORMAL LOW (ref 34.8–46.6)
Hemoglobin: 10 g/dL — ABNORMAL LOW (ref 11.6–15.9)
LYMPHS PCT: 20 %
Lymphs Abs: 1.9 10*3/uL (ref 0.9–3.3)
MCH: 22.6 pg — ABNORMAL LOW (ref 25.1–34.0)
MCHC: 30.9 g/dL — ABNORMAL LOW (ref 31.5–36.0)
MCV: 73.1 fL — AB (ref 79.5–101.0)
MONO ABS: 0.7 10*3/uL (ref 0.1–0.9)
Monocytes Relative: 7 %
Neutro Abs: 7 10*3/uL — ABNORMAL HIGH (ref 1.5–6.5)
Neutrophils Relative %: 73 %
PLATELETS: 416 10*3/uL — AB (ref 145–400)
RBC: 4.43 MIL/uL (ref 3.70–5.45)
RDW: 18.8 % — AB (ref 11.2–14.5)
WBC Count: 9.7 10*3/uL (ref 3.9–10.3)

## 2017-08-08 LAB — CMP (CANCER CENTER ONLY)
ALBUMIN: 3.7 g/dL (ref 3.5–5.0)
ALT: 12 U/L (ref 0–55)
AST: 11 U/L (ref 5–34)
Alkaline Phosphatase: 59 U/L (ref 40–150)
Anion gap: 9 (ref 3–11)
BUN: 18 mg/dL (ref 7–26)
CHLORIDE: 103 mmol/L (ref 98–109)
CO2: 27 mmol/L (ref 22–29)
Calcium: 9.3 mg/dL (ref 8.4–10.4)
Creatinine: 0.99 mg/dL (ref 0.60–1.10)
GFR, Est AFR Am: >60 mL/min (ref >60)
GFR, Estimated: >60 mL/min (ref >60)
GLUCOSE: 100 mg/dL (ref 70–140)
POTASSIUM: 4 mmol/L (ref 3.5–5.1)
Sodium: 139 mmol/L (ref 136–145)
Total Bilirubin: 0.4 mg/dL (ref 0.2–1.2)
Total Protein: 6.9 g/dL (ref 6.4–8.3)

## 2017-08-08 LAB — IRON AND TIBC
IRON: 42 ug/dL (ref 41–142)
Saturation Ratios: 9 % — ABNORMAL LOW (ref 21–57)
TIBC: 441 ug/dL (ref 236–444)
UIBC: 400 ug/dL

## 2017-08-08 LAB — FERRITIN: Ferritin: 24 ng/mL (ref 9–269)

## 2017-08-08 MED ORDER — ACETAMINOPHEN 325 MG PO TABS
ORAL_TABLET | ORAL | Status: AC
  Filled 2017-08-08: qty 2

## 2017-08-08 MED ORDER — ACETAMINOPHEN 325 MG PO TABS
650.0000 mg | ORAL_TABLET | Freq: Once | ORAL | Status: AC
  Administered 2017-08-08: 650 mg via ORAL

## 2017-08-08 MED ORDER — SODIUM CHLORIDE 0.9 % IV SOLN
700.0000 mg | Freq: Once | INTRAVENOUS | Status: AC
  Administered 2017-08-08: 700 mg via INTRAVENOUS
  Filled 2017-08-08: qty 70

## 2017-08-08 MED ORDER — SODIUM CHLORIDE 0.9% FLUSH
10.0000 mL | INTRAVENOUS | Status: DC | PRN
  Administered 2017-08-08: 10 mL
  Filled 2017-08-08: qty 10

## 2017-08-08 MED ORDER — PALONOSETRON HCL INJECTION 0.25 MG/5ML
0.2500 mg | Freq: Once | INTRAVENOUS | Status: AC
  Administered 2017-08-08: 0.25 mg via INTRAVENOUS

## 2017-08-08 MED ORDER — DIPHENHYDRAMINE HCL 25 MG PO CAPS
ORAL_CAPSULE | ORAL | Status: AC
  Filled 2017-08-08: qty 2

## 2017-08-08 MED ORDER — SODIUM CHLORIDE 0.9 % IV SOLN
Freq: Once | INTRAVENOUS | Status: AC
  Administered 2017-08-08: 13:00:00 via INTRAVENOUS
  Filled 2017-08-08: qty 5

## 2017-08-08 MED ORDER — DIPHENHYDRAMINE HCL 25 MG PO CAPS
50.0000 mg | ORAL_CAPSULE | Freq: Once | ORAL | Status: AC
  Administered 2017-08-08: 50 mg via ORAL

## 2017-08-08 MED ORDER — DOCETAXEL CHEMO INJECTION 160 MG/16ML
75.0000 mg/m2 | Freq: Once | INTRAVENOUS | Status: AC
  Administered 2017-08-08: 170 mg via INTRAVENOUS
  Filled 2017-08-08: qty 17

## 2017-08-08 MED ORDER — PALONOSETRON HCL INJECTION 0.25 MG/5ML
INTRAVENOUS | Status: AC
  Filled 2017-08-08: qty 5

## 2017-08-08 MED ORDER — HEPARIN SOD (PORK) LOCK FLUSH 100 UNIT/ML IV SOLN
500.0000 [IU] | Freq: Once | INTRAVENOUS | Status: AC | PRN
  Administered 2017-08-08: 500 [IU]
  Filled 2017-08-08: qty 5

## 2017-08-08 MED ORDER — TRASTUZUMAB CHEMO 150 MG IV SOLR
600.0000 mg | Freq: Once | INTRAVENOUS | Status: AC
  Administered 2017-08-08: 600 mg via INTRAVENOUS
  Filled 2017-08-08: qty 28.57

## 2017-08-08 MED ORDER — PERTUZUMAB CHEMO INJECTION 420 MG/14ML
420.0000 mg | Freq: Once | INTRAVENOUS | Status: AC
  Administered 2017-08-08: 420 mg via INTRAVENOUS
  Filled 2017-08-08: qty 14

## 2017-08-08 MED ORDER — SODIUM CHLORIDE 0.9 % IV SOLN
Freq: Once | INTRAVENOUS | Status: AC
  Administered 2017-08-08: 12:00:00 via INTRAVENOUS

## 2017-08-08 NOTE — Progress Notes (Signed)
[facility] Follow up:    Deland Pretty, [address] [place] [address]   DIAGNOSIS: Cancer Staging Malignant neoplasm of upper-outer quadrant of right breast in female, estrogen receptor negative ([place]) Staging form: Breast, AJCC 8th Edition - Clinical: Stage IIIA (cT3, cN1, cM0, G3, ER: Negative, PR: Negative, HER2: Positive) - Unsigned   SUMMARY OF ONCOLOGIC HISTORY:   Malignant neoplasm of upper-outer quadrant of right breast in female, estrogen receptor negative (Rebecca [name])   07/09/2017 Initial Diagnosis    Patient palpated a right breast mass which was evaluated by mammogram and ultrasound and a breast MRI which revealed 4.5 x 4 cm necrotic tumor with suspicious multiple right axillary lymph nodes; biopsy revealed IDC grade 3 ER 0%, PR 0%, HER-2 positive ratio 3.08, Ki-67 70%, T2NX stage IIa/IIb      07/18/2017 -  Neo-Adjuvant Chemotherapy    TCH Perjeta x6 cycles followed by Herceptin Perjeta maintenance       CURRENT THERAPY: [place] Perjeta cycle 2 day 1  INTERVAL HISTORY: Rebecca [name] 47 y.o. female returns for evalaution prior to chemotherapy.  She is doing well.  She is wanting [place] to go ahead and call in some magic mouthwash for her today.  No peripheral neuroapthy.     Patient Active Problem List   Diagnosis Date Noted  . Port-A-Cath in place 08/08/2017  . Malignant neoplasm of upper-outer quadrant of right breast in female, estrogen receptor negative (Rebecca [name]) 07/14/2017  . Dependent edema 10/23/2015  . Episodic paroxysmal anxiety disorder 10/23/2015  . Attention deficit disorder 10/23/2015  . Low TSH level 10/23/2015  . Obesity 10/23/2015  . History of abdominoplasty 10/23/2015  . Chest pain 09/09/2011    has No Known Allergies.  MEDICAL HISTORY: Past Medical History:  Diagnosis Date  . Anxiety   . Breast cancer (Rebecca [name])   . Diabetes mellitus without complication (Rebecca [name])   . Diverticulitis   . GERD (gastroesophageal  reflux disease)   . Hiatal hernia   . Hypothyroidism   . LBBB (left bundle branch block)     SURGICAL HISTORY: Past Surgical History:  Procedure Laterality Date  . CESAREAN SECTION  2007/2010   x2  . LAPAROSCOPIC TUBAL LIGATION  06/12/2011   Procedure: LAPAROSCOPIC TUBAL LIGATION;  Surgeon: Daria Pastures, MD;  Location: [facility];  Service: Gynecology;  Laterality: N/A;  filshie clip  . mini tuck  2012   abdominal  . PORTACATH PLACEMENT Right 07/15/2017   Procedure: INSERTION PORT-A-CATH WITH ULTRASOUND;  Surgeon: Rolm Bookbinder, MD;  Location: [facility];  Service: General;  Laterality: Right;  . right breast cancer     upper-outer quadrant   . right ear surg  03/2008   Mohs  . TUBAL LIGATION    . US ECHOCARDIOGRAPHY  12-30-2007   EF 55-60%  . WISDOM TOOTH EXTRACTION      SOCIAL HISTORY: Social History   Socioeconomic History  . Marital status: Married    Spouse name: Not on file  . Number of children: 2  . Years of education: Not on file  . Highest education level: Not on file  Occupational History  . Occupation: Neurosurgeon  . Financial resource strain: Not on file  . Food insecurity:    Worry: Not on file    Inability: Not on file  . Transportation needs:    Medical: Not on file    Non-medical: Not on file  Tobacco Use  . Smoking  status: Former Smoker    Packs/day: 0.25    Years: 6.00    Pack years: 1.50    Types: Cigarettes    Last attempt to quit: 03/29/2005    Years since quitting: 12.3  . Smokeless tobacco: Never Used  Substance and Sexual Activity  . Alcohol use: Yes    Comment: socially  . Drug use: No  . Sexual activity: Yes    Birth control/protection: Surgical  Lifestyle  . Physical activity:    Days per week: Not on file    Minutes per session: Not on file  . Stress: Not on file  Relationships  . Social connections:    Talks on phone: Not on file    Gets together: Not on file    Attends religious  service: Not on file    Active member of club or organization: Not on file    Attends meetings of clubs or organizations: Not on file    Relationship status: Not on file  . Intimate partner violence:    Fear of current or ex partner: Not on file    Emotionally abused: Not on file    Physically abused: Not on file    Forced sexual activity: Not on file  Other Topics Concern  . Not on file  Social History Narrative   ** Merged History Encounter **        FAMILY HISTORY: Family History  Problem Relation Age of Onset  . Lymphoma Father   . Cancer Father        lymphoma  . Diabetes Brother   . Diabetes Mother   . Liver disease Mother        NASH, d. 68  . Stomach cancer Neg Hx   . Colon cancer Neg Hx     Review of Systems  Constitutional: Negative for appetite change, chills, fatigue, fever and unexpected weight change.  HENT:   Negative for hearing loss, lump/mass and trouble swallowing.   Eyes: Negative for eye problems and icterus.  Respiratory: Negative for chest tightness, cough and shortness of breath.   Cardiovascular: Negative for chest pain, leg swelling and palpitations.  Gastrointestinal: Negative for abdominal distention, abdominal pain, constipation, diarrhea, nausea and vomiting.  Endocrine: Negative for hot flashes.  Skin: Negative for itching and rash.  Neurological: Negative for dizziness, extremity weakness, headaches and numbness.  Hematological: Negative for adenopathy. Does not bruise/bleed easily.  Psychiatric/Behavioral: Negative for depression. The patient is not nervous/anxious.       PHYSICAL EXAMINATION  ECOG PERFORMANCE STATUS: 1 - Symptomatic but completely ambulatory  Vitals:   08/08/17 1052  BP: 124/74  Pulse: 64  Resp: 18  Temp: 98.6 F (37 C)  SpO2: 98%    Physical Exam  Constitutional: She is oriented to person, place, and time and well-developed, well-nourished, and in no distress.  HENT:  Head: Normocephalic and atraumatic.   Mouth/Throat: No oropharyngeal exudate.  Eyes: Pupils are equal, round, and reactive to light. No scleral icterus.  Neck: Neck supple.  Cardiovascular: Normal rate, regular rhythm, normal heart sounds and intact distal pulses.  Pulmonary/Chest: Effort normal and breath sounds normal. No respiratory distress. She has no wheezes. She has no rales.  Abdominal: Soft. Bowel sounds are normal. She exhibits no distension and no mass. There is no tenderness. There is no rebound and no guarding.  Musculoskeletal: She exhibits no edema.  Neurological: She is alert and oriented to person, place, and time.  Skin: Skin is warm and dry. No  rash noted. No erythema.  Psychiatric: Mood and affect normal.    LABORATORY DATA:  CBC    Component Value Date/Time   WBC 9.7 08/08/2017 0954   WBC 8.8 10/17/2015 1140   RBC 4.43 08/08/2017 0954   HGB 13.0 10/17/2015 1140   HCT 32.4 (L) 08/08/2017 0954   PLT 416 (H) 08/08/2017 0954   MCV 73.1 (L) 08/08/2017 0954   MCH 22.6 (L) 08/08/2017 0954   MCHC 30.9 (L) 08/08/2017 0954   RDW 18.8 (H) 08/08/2017 0954   LYMPHSABS 1.9 08/08/2017 0954   MONOABS 0.7 08/08/2017 0954   EOSABS 0.0 08/08/2017 0954   BASOSABS 0.0 08/08/2017 0954    CMP     Component Value Date/Time   NA 139 08/08/2017 0954   K 4.0 08/08/2017 0954   CL 103 08/08/2017 0954   CO2 27 08/08/2017 0954   GLUCOSE 100 08/08/2017 0954   BUN 18 08/08/2017 0954   CREATININE 0.99 08/08/2017 0954   CREATININE 0.77 10/17/2015 1140   CALCIUM 9.3 08/08/2017 0954   PROT 6.9 08/08/2017 0954   ALBUMIN 3.7 08/08/2017 0954   AST 11 08/08/2017 0954   ALT 12 08/08/2017 0954   ALKPHOS 59 08/08/2017 0954   BILITOT 0.4 08/08/2017 0954   GFRNONAA >60 08/08/2017 0954   GFRNONAA >89 10/17/2015 1140   GFRAA >60 08/08/2017 0954   GFRAA >89 10/17/2015 1140        ASSESSMENT and PLAN:   Malignant neoplasm of upper-outer quadrant of right breast in female, estrogen receptor negative  ([place]) 07/09/2017:Patient palpated a right breast mass which was evaluated by mammogram and ultrasound and a breast MRI which revealed 4.5 x 4 cm necrotic tumor with suspicious multiple right axillary lymph nodes; biopsy revealed IDC grade 3 ER 0%, PR 0%, HER-2 positive ratio 3.08, Ki-67 70%, T2NX stage IIa/IIb  Recommendation based on multidisciplinary tumor board: 1. Neoadjuvant chemotherapy with TCH Perjeta 6 cycles followed by Herceptin and Perjeta maintenance for 1 year 2. Followed by bilateral mastectomies with targeted node dissection on the right  3. Followed by adjuvant radiation therapy  ----------------------------------------------------------------------------- Current treatment: Cycle 2 day 1 TCH Perjeta  Rebecca [name] is doing well today. I have called in [place] to her pharmacy.  She will proceed with treatment today.  I reviewed her labs with her which are stable.  She notes she recently finished her heavy menstrual cycle.  Her hemoglobin is 10.  Should she continue to have heavy menses, we reviewed the possibility of starting her on Zoladex.  She will review with Dr. Lindi Adie.  She will return in 3 weeks for labs, f/u and her next chemotherapy.    All questions were answered. The patient knows to call the clinic with any problems, questions or concerns. We can certainly see the patient much sooner if necessary.  A total of (20) minutes of face-to-face time was spent with this patient with greater than 50% of that time in counseling and care-coordination.  This note was electronically signed. Scot Dock, NP 08/08/2017

## 2017-08-08 NOTE — Progress Notes (Signed)
Went to treatment area to introduce myself as Arboriculturist and to ask if she had financial questions or concerns. Patient not sure if she has met her OOP yet but thinks she may be close. Advised her I was going to discuss available resources that may be available to her. Patient states they are over-income if they are income based. Advised that the PAF copay assistance program and [employer] programs are. Gave her my card for any additional financial questions or concerns.

## 2017-08-08 NOTE — Patient Instructions (Signed)
[facility] Discharge Instructions for Patients Receiving Chemotherapy  Today you received the following chemotherapy agents: Herceptin, Perjeta, Taxotere, Carboplatin.  To help prevent nausea and vomiting after your treatment, we encourage you to take your nausea medication as prescribed. If you develop nausea and vomiting that is not controlled by your nausea medication, call the clinic.   BELOW ARE SYMPTOMS THAT SHOULD BE REPORTED IMMEDIATELY:  *FEVER GREATER THAN 100.5 F  *CHILLS WITH OR WITHOUT FEVER  NAUSEA AND VOMITING THAT IS NOT CONTROLLED WITH YOUR NAUSEA MEDICATION  *UNUSUAL SHORTNESS OF BREATH  *UNUSUAL BRUISING OR BLEEDING  TENDERNESS IN MOUTH AND THROAT WITH OR WITHOUT PRESENCE OF ULCERS  *URINARY PROBLEMS  *BOWEL PROBLEMS  UNUSUAL RASH Items with * indicate a potential emergency and should be followed up as soon as possible.  Feel free to call the clinic should you have any questions or concerns. The clinic phone number is [phone].  Please show the CHEMO ALERT CARD at check-in to the Emergency Department and triage nurse.   

## 2017-08-08 NOTE — Assessment & Plan Note (Addendum)
07/09/2017:Patient palpated a right breast mass which was evaluated by mammogram and ultrasound and a breast MRI which revealed 4.5 x 4 cm necrotic tumor with suspicious multiple right axillary lymph nodes; biopsy revealed IDC grade 3 ER 0%, PR 0%, HER-2 positive ratio 3.08, Ki-67 70%, T2NX stage IIa/IIb  Recommendation based on multidisciplinary tumor board: 1. Neoadjuvant chemotherapy with TCH Perjeta 6 cycles followed by Herceptin and Perjeta maintenance for 1 year 2. Followed by bilateral mastectomies with targeted node dissection on the right  3. Followed by adjuvant radiation therapy  ----------------------------------------------------------------------------- Current treatment: Cycle 2 day 1 TCH Perjeta  Rebecca [name] is doing well today. I have called in [place] to her pharmacy.  She will proceed with treatment today.  I reviewed her labs with her which are stable.  She notes she recently finished her heavy menstrual cycle.  Her hemoglobin is 10.  Should she continue to have heavy menses, we reviewed the possibility of starting her on Zoladex.  She will review with Dr. Lindi Adie.  She will return in 3 weeks for labs, f/u and her next chemotherapy.

## 2017-08-08 NOTE — Progress Notes (Signed)
Was able to enroll patient in [place] card for assistance with Herceptin and Perjeta due to no income requirements for that program.  Patient approved. Went to advise patient of approvals and advised there is nothing she needs to do on her end. Patient has my card for any additional financial questions or concerns.

## 2017-08-08 NOTE — Progress Notes (Signed)
REFERRING PROVIDER: Nicholas Lose, MD [place], [place] [address]  PRIMARY PROVIDER:  Deland Pretty, MD  PRIMARY REASON FOR VISIT:  1. Malignant neoplasm of upper-outer quadrant of right breast in female, estrogen receptor negative ([place])      HISTORY OF PRESENT ILLNESS:   Rebecca [name], a 47 y.o. female, was seen for a [place] cancer genetics consultation at the request of Dr. Lindi [name] due to a personal history of cancer.  Rebecca [name] presents to clinic today to discuss the possibility of a hereditary predisposition to cancer, genetic testing, and to further clarify her future cancer risks, as well as potential cancer risks for family members.   In March 2019, at the age of 68, Rebecca [name] was diagnosed with invasive ductal carcinoma of the right breast. This is being treated with chemotherapy.  The patient states that she is planning a double mastectomy, regardless of the genetic testing results.  She will then have radiation.  The tumor is ER?PR-, Her2+.     CANCER HISTORY:    Malignant neoplasm of upper-outer quadrant of right breast in female, estrogen receptor negative ([place])   07/09/2017 Initial Diagnosis    Patient palpated a right breast mass which was evaluated by mammogram and ultrasound and a breast MRI which revealed 4.5 x 4 cm necrotic tumor with suspicious multiple right axillary lymph nodes; biopsy revealed IDC grade 3 ER 0%, PR 0%, HER-2 positive ratio 3.08, Ki-67 70%, T2NX stage IIa/IIb      07/18/2017 -  Neo-Adjuvant Chemotherapy    TCH Perjeta x6 cycles followed by Herceptin Perjeta maintenance        HORMONAL RISK FACTORS:  Menarche was at age 32.  First live birth at age 22.  OCP use for approximately 10+ years.  Ovaries intact: yes.  Hysterectomy: no.  Menopausal status: premenopausal.  HRT use: 0 years. Colonoscopy: yes; normal. Mammogram within the last year: yes. Number of breast biopsies: 1. Up to date with pelvic exams:   yes. Any excessive radiation exposure in the past:  no  Past Medical History:  Diagnosis Date  . Anxiety   . Breast cancer ([place])   . Diabetes mellitus without complication ([place])   . Diverticulitis   . GERD (gastroesophageal reflux disease)   . Hiatal hernia   . Hypothyroidism   . LBBB (left bundle branch block)     Past Surgical History:  Procedure Laterality Date  . CESAREAN SECTION  2007/2010   x2  . LAPAROSCOPIC TUBAL LIGATION  06/12/2011   Procedure: LAPAROSCOPIC TUBAL LIGATION;  Surgeon: Rebecca Pastures, MD;  Location: [facility];  Service: Gynecology;  Laterality: N/A;  filshie clip  . mini tuck  2012   abdominal  . PORTACATH PLACEMENT Right 07/15/2017   Procedure: INSERTION PORT-A-CATH WITH ULTRASOUND;  Surgeon: Rebecca Bookbinder, MD;  Location: [facility];  Service: General;  Laterality: Right;  . right breast cancer     upper-outer quadrant   . right ear surg  03/2008   Mohs  . TUBAL LIGATION    . US ECHOCARDIOGRAPHY  12-30-2007   EF 55-60%  . WISDOM TOOTH EXTRACTION      Social History   Socioeconomic History  . Marital status: Married    Spouse name: Not on file  . Number of children: 2  . Years of education: Not on file  . Highest education level: Not on file  Occupational History  . Occupation: Neurosurgeon  . Financial resource strain:  Not on file  . Food insecurity:    Worry: Not on file    Inability: Not on file  . Transportation needs:    Medical: Not on file    Non-medical: Not on file  Tobacco Use  . Smoking status: Former Smoker    Packs/day: 0.25    Years: 6.00    Pack years: 1.50    Types: Cigarettes    Last attempt to quit: 03/29/2005    Years since quitting: 12.3  . Smokeless tobacco: Never Used  Substance and Sexual Activity  . Alcohol use: Yes    Comment: socially  . Drug use: No  . Sexual activity: Yes    Birth control/protection: Surgical  Lifestyle  . Physical activity:    Days per week: Not on  file    Minutes per session: Not on file  . Stress: Not on file  Relationships  . Social connections:    Talks on phone: Not on file    Gets together: Not on file    Attends religious service: Not on file    Active member of club or organization: Not on file    Attends meetings of clubs or organizations: Not on file    Relationship status: Not on file  Other Topics Concern  . Not on file  Social History Narrative   ** Merged History Encounter **         FAMILY HISTORY:  We obtained a detailed, 4-generation family history.  Significant diagnoses are listed below: Family History  Problem Relation Age of Onset  . Lymphoma Father   . Cancer Father        lymphoma  . Diabetes Brother   . Diabetes Mother   . Liver disease Mother        NASH, d. 53  . Stomach cancer Neg Hx   . Colon cancer Neg Hx     The patient has a son and daughter who are cancer free. She has a maternal half brother who is cancer free.  Both parents are deceased.   The patient's father developed lymphoma and died around age 54.  She did not have a lot of contact with him growing up.  He was an only child.  His parents were both murdered in a robbery.  The patient's mother died of NASH at 45.  She has three maternal half sisters and a maternal half brother.  Not much information is known about these individuals.  The grandparents are both deceased from heart disease and stroke.  Rebecca [name] is unaware of previous family history of genetic testing for hereditary cancer risks. Patient's ancestors are of [place] and [place] descent. There is no reported Ashkenazi Jewish ancestry. There is no known consanguinity.  GENETIC COUNSELING ASSESSMENT: Rebecca [name] is a 48 y.o. female with a personal history of breast cancer and a limited family history which is somewhat suggestive of a hereditary cancer syndrome and predisposition to cancer. We, therefore, discussed and recommended the following at today's visit.    DISCUSSION: We disucssed that about 5-10% of breast cancer is hereditary with most cases due to BRCA mutations.  Other genes associated with hereditary breast cancer syndromes include ATM, CHEK2 and PALB2.  We discussed that her family history is relatively reassuring, however, it is limited on her father's side with not having any female relatives.  We reviewed the characteristics, features and inheritance patterns of hereditary cancer syndromes. We also discussed genetic testing, including the appropriate family members to  test, the process of testing, insurance coverage and turn-around-time for results. We discussed the implications of a negative, positive and/or variant of uncertain significant result. We recommended Rebecca [name] pursue genetic testing for the multi cancer gene panel. The Multi-Gene Panel offered by [facility] includes sequencing and/or deletion duplication testing of the following 83 genes: ALK, APC, ATM, AXIN2,BAP1,  BARD1, BLM, BMPR1A, BRCA1, BRCA2, BRIP1, CASR, CDC73, CDH1, CDK4, CDKN1B, CDKN1C, CDKN2A (p14ARF), CDKN2A (p16INK4a), CEBPA, CHEK2, CTNNA1, DICER1, DIS3L2, EGFR (c.2369C>T, p.Thr790Met variant only), EPCAM (Deletion/duplication testing only), FH, FLCN, GATA2, GPC3, GREM1 (Promoter region deletion/duplication testing only), HOXB13 (c.251G>A, p.Gly84Glu), HRAS, KIT, MAX, MEN1, MET, MITF (c.952G>A, p.Glu318Lys variant only), MLH1, MSH2, MSH3, MSH6, MUTYH, NBN, NF1, NF2, NTHL1, PALB2, PDGFRA, PHOX2B, PMS2, POLD1, POLE, POT1, PRKAR1A, PTCH1, PTEN, RAD50, RAD51C, RAD51D, RB1, RECQL4, RET, RUNX1, SDHAF2, SDHA (sequence changes only), SDHB, SDHC, SDHD, SMAD4, SMARCA4, SMARCB1, SMARCE1, STK11, SUFU, TERT, TERT, TMEM127, TP53, TSC1, TSC2, VHL, WRN and WT1.    Based on Rebecca [name]'s personal history of cancer, she meets medical criteria for genetic testing. Despite that she meets criteria, she may still have an out of pocket cost. We discussed that if her out of pocket cost for testing is  over $100, the laboratory will call and confirm whether she wants to proceed with testing.  If the out of pocket cost of testing is less than $100 she will be billed by the genetic testing laboratory.   PLAN: After considering the risks, benefits, and limitations, Rebecca [name]  provided informed consent to pursue genetic testing and the blood sample was sent to [facility] for analysis of the Multi cancer panel. Results should be available within approximately 2-4 weeks' time, at which point they will be disclosed by telephone to Rebecca [name], as will any additional recommendations warranted by these results. Rebecca [name] will receive a summary of her genetic counseling visit and a copy of her results once available. This information will also be available in Epic. We encouraged Rebecca [name] to remain in contact with cancer genetics annually so that we can continuously update the family history and inform her of any changes in cancer genetics and testing that may be of benefit for her family. Rebecca [name] questions were answered to her satisfaction today. Our contact information was provided should additional questions or concerns arise.  Lastly, we encouraged Rebecca [name] to remain in contact with cancer genetics annually so that we can continuously update the family history and inform her of any changes in cancer genetics and testing that may be of benefit for this family.   Ms.  [name] questions were answered to her satisfaction today. Our contact information was provided should additional questions or concerns arise. Thank you for the referral and allowing [place] to share in the care of your patient.   Chisa Kushner P. Florene Glen, [place], [facility] Certified Genetic Counselor [email or web address] phone: [phone]  The patient was seen for a total of 45 minutes in face-to-face genetic counseling.  This patient was discussed with Drs. Magrinat, Rebecca [name] and/or Burr Medico who agrees with the above.     _______________________________________________________________________ For Office Staff:  Number of people involved in session: 2 Was an Intern/ student involved with case: no

## 2017-08-09 LAB — THYROID PANEL WITH TSH
FREE THYROXINE INDEX: 2.4 (ref 1.2–4.9)
T3 Uptake Ratio: 26 % (ref 24–39)
T4, Total: 9.1 ug/dL (ref 4.5–12.0)
TSH: 2.45 u[IU]/mL (ref 0.450–4.500)

## 2017-08-11 ENCOUNTER — Inpatient Hospital Stay: Payer: BLUE CROSS/BLUE SHIELD

## 2017-08-11 ENCOUNTER — Encounter: Payer: Self-pay | Admitting: Adult Health

## 2017-08-11 ENCOUNTER — Telehealth: Payer: Self-pay | Admitting: Adult Health

## 2017-08-11 VITALS — BP 148/84 | HR 82 | Temp 98.0°F | Resp 18

## 2017-08-11 DIAGNOSIS — Z171 Estrogen receptor negative status [ER-]: Principal | ICD-10-CM

## 2017-08-11 DIAGNOSIS — C50411 Malignant neoplasm of upper-outer quadrant of right female breast: Secondary | ICD-10-CM | POA: Diagnosis not present

## 2017-08-11 MED ORDER — PEGFILGRASTIM-CBQV 6 MG/0.6ML ~~LOC~~ SOSY
PREFILLED_SYRINGE | SUBCUTANEOUS | Status: AC
  Filled 2017-08-11: qty 0.6

## 2017-08-11 MED ORDER — PEGFILGRASTIM-CBQV 6 MG/0.6ML ~~LOC~~ SOSY
6.0000 mg | PREFILLED_SYRINGE | Freq: Once | SUBCUTANEOUS | Status: AC
  Administered 2017-08-11: 6 mg via SUBCUTANEOUS

## 2017-08-11 NOTE — Progress Notes (Signed)
Went to portal for [place] assistance to obtain approval letters.  Patient approved for a 12 month maximum benefit of up to $25,000 for Herceptin which will leave only a $5 copay after insurance.  Patient approved for a 12 month maximum benefit of up to $25,000 for Perjeta which will leave only a $5 copay after insurance.

## 2017-08-11 NOTE — Telephone Encounter (Signed)
Per 4/12 no los °

## 2017-08-19 ENCOUNTER — Encounter: Payer: Self-pay | Admitting: Genetic Counselor

## 2017-08-19 ENCOUNTER — Ambulatory Visit: Payer: Self-pay | Admitting: Genetic Counselor

## 2017-08-19 ENCOUNTER — Telehealth: Payer: Self-pay | Admitting: Genetic Counselor

## 2017-08-19 DIAGNOSIS — C50411 Malignant neoplasm of upper-outer quadrant of right female breast: Secondary | ICD-10-CM

## 2017-08-19 DIAGNOSIS — Z1379 Encounter for other screening for genetic and chromosomal anomalies: Secondary | ICD-10-CM | POA: Insufficient documentation

## 2017-08-19 DIAGNOSIS — Z171 Estrogen receptor negative status [ER-]: Secondary | ICD-10-CM

## 2017-08-19 NOTE — Progress Notes (Signed)
HPI:  Ms. Mceuen was previously seen in the [place] clinic due to a personal history of cancer and concerns regarding a hereditary predisposition to cancer. Please refer to our prior cancer genetics clinic note for more information regarding Ms. Waas's medical, social and family histories, and our assessment and recommendations, at the time. Ms. Buechler recent genetic test results were disclosed to her, as were recommendations warranted by these results. These results and recommendations are discussed in more detail below.  CANCER HISTORY:    Malignant neoplasm of upper-outer quadrant of right breast in female, estrogen receptor negative ([place])   07/09/2017 Initial Diagnosis    Patient palpated a right breast mass which was evaluated by mammogram and ultrasound and a breast MRI which revealed 4.5 x 4 cm necrotic tumor with suspicious multiple right axillary lymph nodes; biopsy revealed IDC grade 3 ER 0%, PR 0%, HER-2 positive ratio 3.08, Ki-67 70%, T2NX stage IIa/IIb      07/18/2017 -  Neo-Adjuvant Chemotherapy    TCH Perjeta x6 cycles followed by Herceptin Perjeta maintenance      08/15/2017 Genetic Testing    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  The Multi-Gene Panel offered by [facility] includes sequencing and/or deletion duplication testing of the following 83 genes: ALK, APC, ATM, AXIN2,BAP1,  BARD1, BLM, BMPR1A, BRCA1, BRCA2, BRIP1, CASR, CDC73, CDH1, CDK4, CDKN1B, CDKN1C, CDKN2A (p14ARF), CDKN2A (p16INK4a), CEBPA, CHEK2, CTNNA1, DICER1, DIS3L2, EGFR (c.2369C>T, p.Thr790Met variant only), EPCAM (Deletion/duplication testing only), FH, FLCN, GATA2, GPC3, GREM1 (Promoter region deletion/duplication testing only), HOXB13 (c.251G>A, p.Gly84Glu), HRAS, KIT, MAX, MEN1, MET, MITF (c.952G>A, p.Glu318Lys variant only), MLH1, MSH2, MSH3, MSH6, MUTYH, NBN, NF1, NF2, NTHL1, PALB2, PDGFRA, PHOX2B, PMS2, POLD1, POLE, POT1, PRKAR1A, PTCH1, PTEN, RAD50, RAD51C,  RAD51D, RB1, RECQL4, RET, RUNX1, SDHAF2, SDHA (sequence changes only), SDHB, SDHC, SDHD, SMAD4, SMARCA4, SMARCB1, SMARCE1, STK11, SUFU, TERT, TERT, TMEM127, TP53, TSC1, TSC2, VHL, WRN and WT1.  The report date is August 15, 2017.       FAMILY HISTORY:  We obtained a detailed, 4-generation family history.  Significant diagnoses are listed below: Family History  Problem Relation Age of Onset  . Lymphoma Father   . Cancer Father        lymphoma  . Diabetes Brother   . Diabetes Mother   . Liver disease Mother        NASH, d. 68  . Stomach cancer Neg Hx   . Colon cancer Neg Hx     The patient has a son and daughter who are cancer free. She has a maternal half brother who is cancer free.  Both parents are deceased.   The patient's father developed lymphoma and died around age 33.  She did not have a lot of contact with him growing up.  He was an only child.  His parents were both murdered in a robbery.  The patient's mother died of NASH at 20.  She has three maternal half sisters and a maternal half brother.  Not much information is known about these individuals.  The grandparents are both deceased from heart disease and stroke.  Ms. Hurlock is unaware of previous family history of genetic testing for hereditary cancer risks. Patient's ancestors are of [place] and [place] descent. There is no reported Ashkenazi Jewish ancestry. There is no known consanguinity.  GENETIC TEST RESULTS: Genetic testing reported out on August 15, 2017 through the multi-cancer panel found no deleterious mutations.  The Multi-Gene Panel offered by [facility] includes sequencing  and/or deletion duplication testing of the following 83 genes: ALK, APC, ATM, AXIN2,BAP1,  BARD1, BLM, BMPR1A, BRCA1, BRCA2, BRIP1, CASR, CDC73, CDH1, CDK4, CDKN1B, CDKN1C, CDKN2A (p14ARF), CDKN2A (p16INK4a), CEBPA, CHEK2, CTNNA1, DICER1, DIS3L2, EGFR (c.2369C>T, p.Thr790Met variant only), EPCAM (Deletion/duplication testing only), FH, FLCN,  GATA2, GPC3, GREM1 (Promoter region deletion/duplication testing only), HOXB13 (c.251G>A, p.Gly84Glu), HRAS, KIT, MAX, MEN1, MET, MITF (c.952G>A, p.Glu318Lys variant only), MLH1, MSH2, MSH3, MSH6, MUTYH, NBN, NF1, NF2, NTHL1, PALB2, PDGFRA, PHOX2B, PMS2, POLD1, POLE, POT1, PRKAR1A, PTCH1, PTEN, RAD50, RAD51C, RAD51D, RB1, RECQL4, RET, RUNX1, SDHAF2, SDHA (sequence changes only), SDHB, SDHC, SDHD, SMAD4, SMARCA4, SMARCB1, SMARCE1, STK11, SUFU, TERT, TERT, TMEM127, TP53, TSC1, TSC2, VHL, WRN and WT1.  The test report has been scanned into EPIC and is located under the Molecular Pathology section of the Results Review tab.    We discussed with Ms. Renfro that since the current genetic testing is not perfect, it is possible there may be a gene mutation in one of these genes that current testing cannot detect, but that chance is small.  We also discussed, that it is possible that another gene that has not yet been discovered, or that we have not yet tested, is responsible for the cancer diagnoses in the family, and it is, therefore, important to remain in touch with cancer genetics in the future so that we can continue to offer Ms. Holten the most up to date genetic testing.   Genetic testing did detect a Variant of Unknown Significance in the MSH6 gene called c.3887A<G (p.Lys1296Arg). At this time, it is unknown if this variant is associated with increased cancer risk or if this is a normal finding, but most variants such as this get reclassified to being inconsequential. It should not be used to make medical management decisions. With time, we suspect the lab will determine the significance of this variant, if any. If we do learn more about it, we will try to contact Ms. Bloodworth to discuss it further. However, it is important to stay in touch with [place] periodically and keep the address and phone number up to date.  CANCER SCREENING RECOMMENDATIONS:  This result is reassuring and indicates that Ms. Sandin likely does  not have an increased risk for a future cancer due to a mutation in one of these genes. This normal test also suggests that Ms. Birmingham's cancer was most likely not due to an inherited predisposition associated with one of these genes.  Most cancers happen by chance and this negative test suggests that her cancer falls into this category.  We, therefore, recommended she continue to follow the cancer management and screening guidelines provided by her oncology and primary healthcare provider.   An individual's cancer risk and medical management are not determined by genetic test results alone. Overall cancer risk assessment incorporates additional factors, including personal medical history, family history, and any available genetic information that may result in a personalized plan for cancer prevention and surveillance.  RECOMMENDATIONS FOR FAMILY MEMBERS:  Women in this family might be at some increased risk of developing cancer, over the general population risk, simply due to the family history of cancer.  We recommended women in this family have a yearly mammogram beginning at age 15, or 24 years younger than the earliest onset of cancer, an annual clinical breast exam, and perform monthly breast self-exams. Women in this family should also have a gynecological exam as recommended by their primary provider. All family members should have a colonoscopy by age 48.  FOLLOW-UP: Lastly, we discussed with Ms. Syme that cancer genetics is a rapidly advancing field and it is possible that new genetic tests will be appropriate for her and/or her family members in the future. We encouraged her to remain in contact with cancer genetics on an annual basis so we can update her personal and family histories and let her know of advances in cancer genetics that may benefit this family.   Our contact number was provided. Ms. Vanderloop questions were answered to her satisfaction, and she knows she is welcome to call us at  anytime with additional questions or concerns.   Roma Kayser, MS, [facility] Certified Genetic Counselor [email or web address]

## 2017-08-19 NOTE — Telephone Encounter (Signed)
Revealed negative genetic testing.  Discussed that we do not know why she has breast cancer or why there is cancer in the family. It could be due to a different gene that we are not testing, or maybe our current technology may not be able to pick something up.  It will be important for her to keep in contact with genetics to keep up with whether additional testing may be needed.  Revealed that testing found an MSH6 VUS.  This is still a normal result.

## 2017-08-29 ENCOUNTER — Other Ambulatory Visit: Payer: BLUE CROSS/BLUE SHIELD

## 2017-08-29 ENCOUNTER — Ambulatory Visit: Payer: BLUE CROSS/BLUE SHIELD | Admitting: Hematology and Oncology

## 2017-08-29 ENCOUNTER — Ambulatory Visit: Payer: BLUE CROSS/BLUE SHIELD

## 2017-09-01 ENCOUNTER — Inpatient Hospital Stay: Payer: BLUE CROSS/BLUE SHIELD

## 2017-09-01 ENCOUNTER — Inpatient Hospital Stay (HOSPITAL_BASED_OUTPATIENT_CLINIC_OR_DEPARTMENT_OTHER): Payer: BLUE CROSS/BLUE SHIELD | Admitting: Hematology and Oncology

## 2017-09-01 ENCOUNTER — Other Ambulatory Visit: Payer: Self-pay | Admitting: Medical

## 2017-09-01 ENCOUNTER — Inpatient Hospital Stay: Payer: BLUE CROSS/BLUE SHIELD | Attending: Hematology and Oncology

## 2017-09-01 ENCOUNTER — Ambulatory Visit: Payer: BLUE CROSS/BLUE SHIELD

## 2017-09-01 VITALS — BP 129/86 | HR 80

## 2017-09-01 DIAGNOSIS — Z171 Estrogen receptor negative status [ER-]: Secondary | ICD-10-CM | POA: Insufficient documentation

## 2017-09-01 DIAGNOSIS — Z5111 Encounter for antineoplastic chemotherapy: Secondary | ICD-10-CM | POA: Diagnosis not present

## 2017-09-01 DIAGNOSIS — Z95828 Presence of other vascular implants and grafts: Secondary | ICD-10-CM

## 2017-09-01 DIAGNOSIS — N920 Excessive and frequent menstruation with regular cycle: Secondary | ICD-10-CM | POA: Diagnosis not present

## 2017-09-01 DIAGNOSIS — Z79899 Other long term (current) drug therapy: Secondary | ICD-10-CM | POA: Diagnosis not present

## 2017-09-01 DIAGNOSIS — C50411 Malignant neoplasm of upper-outer quadrant of right female breast: Secondary | ICD-10-CM

## 2017-09-01 DIAGNOSIS — D500 Iron deficiency anemia secondary to blood loss (chronic): Secondary | ICD-10-CM | POA: Diagnosis not present

## 2017-09-01 DIAGNOSIS — G2581 Restless legs syndrome: Secondary | ICD-10-CM

## 2017-09-01 LAB — CMP (CANCER CENTER ONLY)
ALT: 12 U/L (ref 0–55)
AST: 13 U/L (ref 5–34)
Albumin: 4.1 g/dL (ref 3.5–5.0)
Alkaline Phosphatase: 73 U/L (ref 40–150)
Anion gap: 9 (ref 3–11)
BUN: 16 mg/dL (ref 7–26)
CHLORIDE: 106 mmol/L (ref 98–109)
CO2: 24 mmol/L (ref 22–29)
CREATININE: 0.95 mg/dL (ref 0.60–1.10)
Calcium: 9.7 mg/dL (ref 8.4–10.4)
GFR, Estimated: >60 mL/min (ref >60)
Glucose, Bld: 168 mg/dL — ABNORMAL HIGH (ref 70–140)
POTASSIUM: 4.1 mmol/L (ref 3.5–5.1)
SODIUM: 139 mmol/L (ref 136–145)
Total Bilirubin: 0.3 mg/dL (ref 0.2–1.2)
Total Protein: 7.5 g/dL (ref 6.4–8.3)

## 2017-09-01 LAB — CBC WITH DIFFERENTIAL (CANCER CENTER ONLY)
BASOS ABS: 0 10*3/uL (ref 0.0–0.1)
Basophils Relative: 0 %
EOS ABS: 0 10*3/uL (ref 0.0–0.5)
EOS PCT: 0 %
HCT: 31 % — ABNORMAL LOW (ref 34.8–46.6)
Hemoglobin: 10 g/dL — ABNORMAL LOW (ref 11.6–15.9)
Lymphocytes Relative: 10 %
Lymphs Abs: 0.6 10*3/uL — ABNORMAL LOW (ref 0.9–3.3)
MCH: 23.8 pg — ABNORMAL LOW (ref 25.1–34.0)
MCHC: 32.2 g/dL (ref 31.5–36.0)
MCV: 74 fL — ABNORMAL LOW (ref 79.5–101.0)
Monocytes Absolute: 0.2 10*3/uL (ref 0.1–0.9)
Monocytes Relative: 3 %
Neutro Abs: 5.5 10*3/uL (ref 1.5–6.5)
Neutrophils Relative %: 87 %
Platelet Count: 418 10*3/uL — ABNORMAL HIGH (ref 145–400)
RBC: 4.2 MIL/uL (ref 3.70–5.45)
RDW: 23.7 % — ABNORMAL HIGH (ref 11.2–14.5)
WBC Count: 6.3 10*3/uL (ref 3.9–10.3)

## 2017-09-01 MED ORDER — PALONOSETRON HCL INJECTION 0.25 MG/5ML
0.2500 mg | Freq: Once | INTRAVENOUS | Status: AC
  Administered 2017-09-01: 0.25 mg via INTRAVENOUS

## 2017-09-01 MED ORDER — ACETAMINOPHEN 325 MG PO TABS
650.0000 mg | ORAL_TABLET | Freq: Once | ORAL | Status: AC
  Administered 2017-09-01: 650 mg via ORAL

## 2017-09-01 MED ORDER — PALONOSETRON HCL INJECTION 0.25 MG/5ML
INTRAVENOUS | Status: AC
  Filled 2017-09-01: qty 5

## 2017-09-01 MED ORDER — HEPARIN SOD (PORK) LOCK FLUSH 100 UNIT/ML IV SOLN
500.0000 [IU] | Freq: Once | INTRAVENOUS | Status: AC | PRN
  Administered 2017-09-01: 500 [IU]
  Filled 2017-09-01: qty 5

## 2017-09-01 MED ORDER — DIPHENHYDRAMINE HCL 25 MG PO CAPS
ORAL_CAPSULE | ORAL | Status: AC
  Filled 2017-09-01: qty 2

## 2017-09-01 MED ORDER — SODIUM CHLORIDE 0.9 % IV SOLN
Freq: Once | INTRAVENOUS | Status: AC
Start: 2017-09-01 — End: 2017-09-01
  Administered 2017-09-01: 09:00:00 via INTRAVENOUS

## 2017-09-01 MED ORDER — ACETAMINOPHEN 325 MG PO TABS
ORAL_TABLET | ORAL | Status: AC
  Filled 2017-09-01: qty 2

## 2017-09-01 MED ORDER — LORAZEPAM 2 MG/ML IJ SOLN
INTRAMUSCULAR | Status: AC
  Filled 2017-09-01: qty 1

## 2017-09-01 MED ORDER — SODIUM CHLORIDE 0.9 % IV SOLN
510.0000 mg | Freq: Once | INTRAVENOUS | Status: AC
  Administered 2017-09-01: 510 mg via INTRAVENOUS
  Filled 2017-09-01: qty 17

## 2017-09-01 MED ORDER — SODIUM CHLORIDE 0.9 % IV SOLN
Freq: Once | INTRAVENOUS | Status: AC
  Administered 2017-09-01: 10:00:00 via INTRAVENOUS
  Filled 2017-09-01: qty 5

## 2017-09-01 MED ORDER — SODIUM CHLORIDE 0.9% FLUSH
10.0000 mL | INTRAVENOUS | Status: DC | PRN
  Administered 2017-09-01: 10 mL
  Filled 2017-09-01: qty 10

## 2017-09-01 MED ORDER — LORAZEPAM 2 MG/ML IJ SOLN
1.0000 mg | Freq: Once | INTRAMUSCULAR | Status: AC
  Administered 2017-09-01: 1 mg via INTRAVENOUS

## 2017-09-01 MED ORDER — LORAZEPAM 2 MG/ML IJ SOLN: 1.0000 mg | Freq: Once | INTRAMUSCULAR | Status: DC

## 2017-09-01 MED ORDER — SODIUM CHLORIDE 0.9 % IV SOLN
75.0000 mg/m2 | Freq: Once | INTRAVENOUS | Status: AC
  Administered 2017-09-01: 170 mg via INTRAVENOUS
  Filled 2017-09-01: qty 17

## 2017-09-01 MED ORDER — SODIUM CHLORIDE 0.9 % IV SOLN
700.0000 mg | Freq: Once | INTRAVENOUS | Status: AC
  Administered 2017-09-01: 700 mg via INTRAVENOUS
  Filled 2017-09-01: qty 70

## 2017-09-01 MED ORDER — DIPHENHYDRAMINE HCL 25 MG PO CAPS
50.0000 mg | ORAL_CAPSULE | Freq: Once | ORAL | Status: AC
  Administered 2017-09-01: 50 mg via ORAL

## 2017-09-01 MED ORDER — SODIUM CHLORIDE 0.9 % IV SOLN
420.0000 mg | Freq: Once | INTRAVENOUS | Status: AC
  Administered 2017-09-01: 420 mg via INTRAVENOUS
  Filled 2017-09-01: qty 14

## 2017-09-01 MED ORDER — TRASTUZUMAB CHEMO 150 MG IV SOLR
600.0000 mg | Freq: Once | INTRAVENOUS | Status: AC
  Administered 2017-09-01: 600 mg via INTRAVENOUS
  Filled 2017-09-01: qty 28.57

## 2017-09-01 NOTE — Progress Notes (Signed)
Per MD Lindi Adie pt will receive IV iron this visit and the next.  Pt verbalized understanding.  Pt tolerated IV iron well, no complaints.  VS stable.  30 min post observation period completed.

## 2017-09-01 NOTE — Patient Instructions (Signed)
[facility] Discharge Instructions for Patients Receiving Chemotherapy  Today you received the following chemotherapy agents:  Herceptin, Perjeta, Taxotere, Carboplatin.  To help prevent nausea and vomiting after your treatment, we encourage you to take your nausea medication as prescribed.   If you develop nausea and vomiting that is not controlled by your nausea medication, call the clinic.   BELOW ARE SYMPTOMS THAT SHOULD BE REPORTED IMMEDIATELY:  *FEVER GREATER THAN 100.5 F  *CHILLS WITH OR WITHOUT FEVER  NAUSEA AND VOMITING THAT IS NOT CONTROLLED WITH YOUR NAUSEA MEDICATION  *UNUSUAL SHORTNESS OF BREATH  *UNUSUAL BRUISING OR BLEEDING  TENDERNESS IN MOUTH AND THROAT WITH OR WITHOUT PRESENCE OF ULCERS  *URINARY PROBLEMS  *BOWEL PROBLEMS  UNUSUAL RASH Items with * indicate a potential emergency and should be followed up as soon as possible.  Feel free to call the clinic should you have any questions or concerns. The clinic phone number is [phone].  Please show the [place] at check-in to the Emergency Department and triage nurse.    Ferumoxytol injection What is this medicine? FERUMOXYTOL is an iron complex. Iron is used to make healthy red blood cells, which carry oxygen and nutrients throughout the body. This medicine is used to treat iron deficiency anemia in people with chronic kidney disease. This medicine may be used for other purposes; ask your health care provider or pharmacist if you have questions. COMMON BRAND NAME(S): Feraheme What should I tell my health care provider before I take this medicine? They need to know if you have any of these conditions: -anemia not caused by low iron levels -high levels of iron in the blood -magnetic resonance imaging (MRI) test scheduled -an unusual or allergic reaction to iron, other medicines, foods, dyes, or preservatives -pregnant or trying to get pregnant -breast-feeding How should I use  this medicine? This medicine is for injection into a vein. It is given by a health care professional in a hospital or clinic setting. Talk to your pediatrician regarding the use of this medicine in children. Special care may be needed. Overdosage: If you think you have taken too much of this medicine contact a poison control center or emergency room at once. NOTE: This medicine is only for you. Do not share this medicine with others. What if I miss a dose? It is important not to miss your dose. Call your doctor or health care professional if you are unable to keep an appointment. What may interact with this medicine? This medicine may interact with the following medications: -other iron products This list may not describe all possible interactions. Give your health care provider a list of all the medicines, herbs, non-prescription drugs, or dietary supplements you use. Also tell them if you smoke, drink alcohol, or use illegal drugs. Some items may interact with your medicine. What should I watch for while using this medicine? Visit your doctor or healthcare professional regularly. Tell your doctor or healthcare professional if your symptoms do not start to get better or if they get worse. You may need blood work done while you are taking this medicine. You may need to follow a special diet. Talk to your doctor. Foods that contain iron include: whole grains/cereals, dried fruits, beans, or peas, leafy green vegetables, and organ meats (liver, kidney). What side effects may I notice from receiving this medicine? Side effects that you should report to your doctor or health care professional as soon as possible: -allergic reactions like skin rash, itching  or hives, swelling of the face, lips, or tongue -breathing problems -changes in blood pressure -feeling faint or lightheaded, falls -fever or chills -flushing, sweating, or hot feelings -swelling of the ankles or feet Side effects that usually  do not require medical attention (report to your doctor or health care professional if they continue or are bothersome): -diarrhea -headache -nausea, vomiting -stomach pain This list may not describe all possible side effects. Call your doctor for medical advice about side effects. You may report side effects to FDA at 1-800-FDA-1088. Where should I keep my medicine? This drug is given in a hospital or clinic and will not be stored at home. NOTE: This sheet is a summary. It may not cover all possible information. If you have questions about this medicine, talk to your doctor, pharmacist, or health care provider.  2018 Elsevier/Gold Standard (2015-05-18 12:41:49)

## 2017-09-01 NOTE — Assessment & Plan Note (Signed)
07/09/2017:Patient palpated a right breast mass which was evaluated by mammogram and ultrasound and a breast MRI which revealed 4.5 x 4 cm necrotic tumor with suspicious multiple right axillary lymph nodes; biopsy revealed IDC grade 3 ER 0%, PR 0%, HER-2 positive ratio 3.08, Ki-67 70%, T2NX stage IIa/IIb  Recommendationbased on multidisciplinary tumor board: 1. Neoadjuvant chemotherapy with TCH Perjeta 6 cycles followed by Herceptinand Perjetamaintenance for 1 year 2. Followed bybilateral mastectomies with targeted node dissection on the right 3. Followed by adjuvant radiation therapy  ----------------------------------------------------------------------------- Current treatment: Cycle 3 day 1 TCH Perjeta Chemo toxicities: 1.  Neulasta related bone pain: 2. throat pain: Due to mucositis sent Magic mouthwash 3.  Leukopenia is expected: No change in the dosing 4.  Microcytic anemia: Ferritin 24, iron saturation 9%, TIBC 441 I recommended IV iron therapy 5.  Fatigue: TSH and thyroid studies do not show evidence of hypothyroidism  Heavy menstrual cycles: We will consider giving Zoladex if it continues Return to clinic in 3 weeks for cycle 4

## 2017-09-01 NOTE — Progress Notes (Signed)
Patient Care Team: Deland Pretty, MD as PCP - General (Internal Medicine) Baxley, Cresenciano Lick, MD (Internal Medicine)  DIAGNOSIS:  Encounter Diagnosis  Name Primary?  . Malignant neoplasm of upper-outer quadrant of right breast in female, estrogen receptor negative ([place])     SUMMARY OF ONCOLOGIC HISTORY:   Malignant neoplasm of upper-outer quadrant of right breast in female, estrogen receptor negative ([place])   07/09/2017 Initial Diagnosis    Patient palpated a right breast mass which was evaluated by mammogram and ultrasound and a breast MRI which revealed 4.5 x 4 cm necrotic tumor with suspicious multiple right axillary lymph nodes; biopsy revealed IDC grade 3 ER 0%, PR 0%, HER-2 positive ratio 3.08, Ki-67 70%, T2NX stage IIa/IIb      07/18/2017 -  Neo-Adjuvant Chemotherapy    TCH Perjeta x6 cycles followed by Herceptin Perjeta maintenance      08/15/2017 Genetic Testing    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  The Multi-Gene Panel offered by [facility] includes sequencing and/or deletion duplication testing of the following 83 genes: ALK, APC, ATM, AXIN2,BAP1,  BARD1, BLM, BMPR1A, BRCA1, BRCA2, BRIP1, CASR, CDC73, CDH1, CDK4, CDKN1B, CDKN1C, CDKN2A (p14ARF), CDKN2A (p16INK4a), CEBPA, CHEK2, CTNNA1, DICER1, DIS3L2, EGFR (c.2369C>T, p.Thr790Met variant only), EPCAM (Deletion/duplication testing only), FH, FLCN, GATA2, GPC3, GREM1 (Promoter region deletion/duplication testing only), HOXB13 (c.251G>A, p.Gly84Glu), HRAS, KIT, MAX, MEN1, MET, MITF (c.952G>A, p.Glu318Lys variant only), MLH1, MSH2, MSH3, MSH6, MUTYH, NBN, NF1, NF2, NTHL1, PALB2, PDGFRA, PHOX2B, PMS2, POLD1, POLE, POT1, PRKAR1A, PTCH1, PTEN, RAD50, RAD51C, RAD51D, RB1, RECQL4, RET, RUNX1, SDHAF2, SDHA (sequence changes only), SDHB, SDHC, SDHD, SMAD4, SMARCA4, SMARCB1, SMARCE1, STK11, SUFU, TERT, TERT, TMEM127, TP53, TSC1, TSC2, VHL, WRN and WT1.  The report date is August 15, 2017.       CHIEF COMPLIANT:  Cycle 3 TCH Perjeta  INTERVAL HISTORY: Rebecca [name] is a 47 year old with above-mentioned history of right breast cancer currently neoadjuvant chemotherapy with TCH Perjeta.  Today is cycle 3 of her treatment.  She has had mild mucositis with chemotherapy for which she is using Magic mouthwash.  She has had fatigue as a result of anemia.  Anemia work-up suggested iron deficiency.  Thyroid work-up was negative.  She had heavy menstrual cycles during chemotherapy.  This could also have contributed to her iron deficiency anemia.  She does not have any nausea or vomiting.  Denies any fevers or chills.  REVIEW OF SYSTEMS:   Constitutional: Denies fevers, chills or abnormal weight loss Eyes: Denies blurriness of vision Ears, nose, mouth, throat, and face: Denies mucositis or sore throat Respiratory: Denies cough, dyspnea or wheezes Cardiovascular: Denies palpitation, chest discomfort Gastrointestinal:  Denies nausea, heartburn or change in bowel habits Skin: Denies abnormal skin rashes Lymphatics: Denies new lymphadenopathy or easy bruising Neurological:Denies numbness, tingling or new weaknesses Behavioral/Psych: Mood is stable, no new changes  Extremities: No lower extremity edema  All other systems were reviewed with the patient and are negative.  I have reviewed the past medical history, past surgical history, social history and family history with the patient and they are unchanged from previous note.  ALLERGIES:  has No Known Allergies.  MEDICATIONS:  Current Outpatient Medications  Medication Sig Dispense Refill  . dexamethasone (DECADRON) 4 MG tablet Take 1 tablet (4 mg total) by mouth daily. Take 1 tablet day before chemo and 1 tablet day after with food 12 tablet 0  . esomeprazole (NEXIUM) 40 MG capsule Nexium 40 mg capsule,delayed release  Take 1 capsule  every day by oral route.    Marland Kitchen HYDROcodone-acetaminophen (NORCO) 10-325 MG tablet Take 1 tablet by mouth every 6 (six) hours  as needed. (Patient not taking: Reported on 07/23/2017) 10 tablet 0  . levothyroxine (SYNTHROID, LEVOTHROID) 137 MCG tablet Take 137 mcg by mouth daily before breakfast.    . lidocaine-prilocaine (EMLA) cream Apply to affected area once 30 g 3  . LORazepam (ATIVAN) 0.5 MG tablet Take 1 tablet (0.5 mg total) by mouth at bedtime as needed for sleep. (Patient not taking: Reported on 07/23/2017) 30 tablet 0  . LORazepam (ATIVAN) 1 MG tablet Take 1 tablet (1 mg total) by mouth every 6 (six) hours as needed for anxiety. (Patient not taking: Reported on 07/23/2017) 12 tablet 0  . magic mouthwash w/lidocaine SOLN Take 5 mLs by mouth 3 (three) times daily as needed for mouth pain. 200 mL 0  . metFORMIN (GLUCOPHAGE) 500 MG tablet Take by mouth daily with breakfast.    . ondansetron (ZOFRAN) 8 MG tablet Take 1 tablet (8 mg total) by mouth 2 (two) times daily as needed (Nausea or vomiting). Begin 4 days after chemotherapy. 30 tablet 1  . prochlorperazine (COMPAZINE) 10 MG tablet Take 1 tablet (10 mg total) by mouth every 6 (six) hours as needed (Nausea or vomiting). 30 tablet 1  . propranolol (INNOPRAN XL) 120 MG 24 hr capsule Take 120 mg by mouth at bedtime.    . triamterene-hydrochlorothiazide (MAXZIDE) 75-50 MG tablet One po daily prn lower extremity edema (Patient not taking: Reported on 07/23/2017) 30 tablet 2   No current facility-administered medications for this visit.     PHYSICAL EXAMINATION: ECOG PERFORMANCE STATUS: 1 - Symptomatic but completely ambulatory  Vitals:   09/01/17 0825  BP: (!) 127/94  Pulse: 99  Resp: 19  Temp: 98.5 F (36.9 C)  SpO2: 100%   Filed Weights   09/01/17 0825  Weight: 225 lb 12.8 oz (102.4 kg)    GENERAL:alert, no distress and comfortable SKIN: skin color, texture, turgor are normal, no rashes or significant lesions EYES: normal, Conjunctiva are pink and non-injected, sclera clear OROPHARYNX:no exudate, no erythema and lips, buccal mucosa, and tongue normal    NECK: supple, thyroid normal size, non-tender, without nodularity LYMPH:  no palpable lymphadenopathy in the cervical, axillary or inguinal LUNGS: clear to auscultation and percussion with normal breathing effort HEART: regular rate & rhythm and no murmurs and no lower extremity edema ABDOMEN:abdomen soft, non-tender and normal bowel sounds MUSCULOSKELETAL:no cyanosis of digits and no clubbing  NEURO: alert & oriented x 3 with fluent speech, no focal motor/sensory deficits EXTREMITIES: No lower extremity edema  LABORATORY DATA:  I have reviewed the data as listed CMP Latest Ref Rng & Units 09/01/2017 08/08/2017 07/25/2017  Glucose 70 - 140 mg/dL 168(H) 100 120  BUN 7 - 26 mg/dL _0 Creatinine 0.60 - 1.10 mg/dL 0.95 0.99 0.88  Sodium 136 - 145 mmol/L 139 139 139  Potassium 3.5 - 5.1 mmol/L 4.1 4.0 4.4  Chloride 98 - 109 mmol/L 106 103 106  CO2 22 - 29 mmol/L _1 Calcium 8.4 - 10.4 mg/dL 9.7 9.3 9.0  Total Protein 6.4 - 8.3 g/dL 7.5 6.9 6.4  Total Bilirubin 0.2 - 1.2 mg/dL 0.3 0.4 <0.2(L)  Alkaline Phos 40 - 150 U/L 73 59 55  AST 5 - 34 U/L _2 ALT 0 - 55 U/L _3 Lab Results  Component Value Date  WBC 6.3 09/01/2017   HGB 10.0 (L) 09/01/2017   HCT 31.0 (L) 09/01/2017   MCV 74.0 (L) 09/01/2017   PLT 418 (H) 09/01/2017   NEUTROABS 5.5 09/01/2017    ASSESSMENT & PLAN:  Malignant neoplasm of upper-outer quadrant of right breast in female, estrogen receptor negative ([place]) 07/09/2017:Patient palpated a right breast mass which was evaluated by mammogram and ultrasound and a breast MRI which revealed 4.5 x 4 cm necrotic tumor with suspicious multiple right axillary lymph nodes; biopsy revealed IDC grade 3 ER 0%, PR 0%, HER-2 positive ratio 3.08, Ki-67 70%, T2NX stage IIa/IIb  Recommendationbased on multidisciplinary tumor board: 1. Neoadjuvant chemotherapy with TCH Perjeta 6 cycles followed by Herceptinand Perjetamaintenance for 1 year 2. Followed  bybilateral mastectomies with targeted node dissection on the right 3. Followed by adjuvant radiation therapy  ----------------------------------------------------------------------------- Current treatment: Cycle 3 day 1 TCH Perjeta Chemo toxicities: 1.  Neulasta related bone pain: 2. throat pain: Due to mucositis sent Magic mouthwash 3.  Leukopenia is expected: No change in the dosing 4.  Microcytic anemia: Ferritin 24, iron saturation 9%, TIBC 441 I recommended IV iron therapy 5.  Fatigue: TSH and thyroid studies do not show evidence of hypothyroidism  Iron deficiency anemia: Due to heavy menstrual bleeding.  I recommended intravenous iron therapy today and the next dose will be in 3 weeks with her next cycle. Heavy menstrual cycles: We will consider giving Zoladex if it continues Return to clinic in 3 weeks for cycle 4   No orders of the defined types were placed in this encounter.  The patient has a good understanding of the overall plan. she agrees with it. she will call with any problems that may develop before the next visit here.   Harriette Ohara, MD 09/01/17

## 2017-09-03 ENCOUNTER — Inpatient Hospital Stay: Payer: BLUE CROSS/BLUE SHIELD

## 2017-09-03 VITALS — BP 138/86 | HR 78 | Temp 97.8°F | Resp 18

## 2017-09-03 DIAGNOSIS — C50411 Malignant neoplasm of upper-outer quadrant of right female breast: Secondary | ICD-10-CM

## 2017-09-03 DIAGNOSIS — Z171 Estrogen receptor negative status [ER-]: Principal | ICD-10-CM

## 2017-09-03 MED ORDER — PEGFILGRASTIM INJECTION 6 MG/0.6ML ~~LOC~~
6.0000 mg | PREFILLED_SYRINGE | Freq: Once | SUBCUTANEOUS | Status: AC
  Administered 2017-09-03: 6 mg via SUBCUTANEOUS

## 2017-09-03 MED ORDER — PEGFILGRASTIM INJECTION 6 MG/0.6ML ~~LOC~~
PREFILLED_SYRINGE | SUBCUTANEOUS | Status: AC
  Filled 2017-09-03: qty 0.6

## 2017-09-03 MED ORDER — PEGFILGRASTIM-CBQV 6 MG/0.6ML ~~LOC~~ SOSY
PREFILLED_SYRINGE | SUBCUTANEOUS | Status: AC
  Filled 2017-09-03: qty 0.6

## 2017-09-03 MED ORDER — PEGFILGRASTIM-CBQV 6 MG/0.6ML ~~LOC~~ SOSY: 6.0000 mg | PREFILLED_SYRINGE | Freq: Once | SUBCUTANEOUS | Status: DC

## 2017-09-03 NOTE — Patient Instructions (Signed)
Pegfilgrastim injection What is this medicine? PEGFILGRASTIM (PEG fil gra stim) is a long-acting granulocyte colony-stimulating factor that stimulates the growth of neutrophils, a type of white blood cell important in the body's fight against infection. It is used to reduce the incidence of fever and infection in patients with certain types of cancer who are receiving chemotherapy that affects the bone marrow, and to increase survival after being exposed to high doses of radiation. This medicine may be used for other purposes; ask your health care provider or pharmacist if you have questions. COMMON BRAND NAME(S): Neulasta What should I tell my health care provider before I take this medicine? They need to know if you have any of these conditions: -kidney disease -latex allergy -ongoing radiation therapy -sickle cell disease -skin reactions to acrylic adhesives (On-Body Injector only) -an unusual or allergic reaction to pegfilgrastim, filgrastim, other medicines, foods, dyes, or preservatives -pregnant or trying to get pregnant -breast-feeding How should I use this medicine? This medicine is for injection under the skin. If you get this medicine at home, you will be taught how to prepare and give the pre-filled syringe or how to use the On-body Injector. Refer to the patient Instructions for Use for detailed instructions. Use exactly as directed. Tell your healthcare provider immediately if you suspect that the On-body Injector may not have performed as intended or if you suspect the use of the On-body Injector resulted in a missed or partial dose. It is important that you put your used needles and syringes in a special sharps container. Do not put them in a trash can. If you do not have a sharps container, call your pharmacist or healthcare provider to get one. Talk to your pediatrician regarding the use of this medicine in children. While this drug may be prescribed for selected conditions,  precautions do apply. Overdosage: If you think you have taken too much of this medicine contact a poison control center or emergency room at once. NOTE: This medicine is only for you. Do not share this medicine with others. What if I miss a dose? It is important not to miss your dose. Call your doctor or health care professional if you miss your dose. If you miss a dose due to an On-body Injector failure or leakage, a new dose should be administered as soon as possible using a single prefilled syringe for manual use. What may interact with this medicine? Interactions have not been studied. Give your health care provider a list of all the medicines, herbs, non-prescription drugs, or dietary supplements you use. Also tell them if you smoke, drink alcohol, or use illegal drugs. Some items may interact with your medicine. This list may not describe all possible interactions. Give your health care provider a list of all the medicines, herbs, non-prescription drugs, or dietary supplements you use. Also tell them if you smoke, drink alcohol, or use illegal drugs. Some items may interact with your medicine. What should I watch for while using this medicine? You may need blood work done while you are taking this medicine. If you are going to need a MRI, CT scan, or other procedure, tell your doctor that you are using this medicine (On-Body Injector only). What side effects may I notice from receiving this medicine? Side effects that you should report to your doctor or health care professional as soon as possible: -allergic reactions like skin rash, itching or hives, swelling of the face, lips, or tongue -dizziness -fever -pain, redness, or irritation at site   where injected -pinpoint red spots on the skin -red or dark-brown urine -shortness of breath or breathing problems -stomach or side pain, or pain at the shoulder -swelling -tiredness -trouble passing urine or change in the amount of urine Side  effects that usually do not require medical attention (report to your doctor or health care professional if they continue or are bothersome): -bone pain -muscle pain This list may not describe all possible side effects. Call your doctor for medical advice about side effects. You may report side effects to FDA at 1-800-FDA-1088. Where should I keep my medicine? Keep out of the reach of children. Store pre-filled syringes in a refrigerator between 2 and 8 degrees C (36 and 46 degrees F). Do not freeze. Keep in carton to protect from light. Throw away this medicine if it is left out of the refrigerator for more than 48 hours. Throw away any unused medicine after the expiration date. NOTE: This sheet is a summary. It may not cover all possible information. If you have questions about this medicine, talk to your doctor, pharmacist, or health care provider.  2018 [employer] (2016-04-11 12:58:03)  

## 2017-09-15 ENCOUNTER — Encounter: Payer: Self-pay | Admitting: *Deleted

## 2017-09-15 ENCOUNTER — Other Ambulatory Visit: Payer: Self-pay | Admitting: *Deleted

## 2017-09-15 DIAGNOSIS — C50411 Malignant neoplasm of upper-outer quadrant of right female breast: Secondary | ICD-10-CM

## 2017-09-15 DIAGNOSIS — Z171 Estrogen receptor negative status [ER-]: Principal | ICD-10-CM

## 2017-09-15 MED ORDER — ONDANSETRON HCL 8 MG PO TABS: 8.0000 mg | ORAL_TABLET | Freq: Two times a day (BID) | ORAL | 3 refills | Status: DC | PRN

## 2017-09-23 ENCOUNTER — Inpatient Hospital Stay (HOSPITAL_BASED_OUTPATIENT_CLINIC_OR_DEPARTMENT_OTHER): Payer: BLUE CROSS/BLUE SHIELD | Admitting: Hematology and Oncology

## 2017-09-23 ENCOUNTER — Telehealth: Payer: Self-pay | Admitting: Hematology and Oncology

## 2017-09-23 ENCOUNTER — Inpatient Hospital Stay: Payer: BLUE CROSS/BLUE SHIELD

## 2017-09-23 VITALS — BP 119/84 | HR 91 | Temp 98.7°F | Resp 17 | Ht 70.0 in | Wt 227.5 lb

## 2017-09-23 DIAGNOSIS — Z5181 Encounter for therapeutic drug level monitoring: Secondary | ICD-10-CM

## 2017-09-23 DIAGNOSIS — D500 Iron deficiency anemia secondary to blood loss (chronic): Secondary | ICD-10-CM

## 2017-09-23 DIAGNOSIS — Z79899 Other long term (current) drug therapy: Secondary | ICD-10-CM

## 2017-09-23 DIAGNOSIS — Z95828 Presence of other vascular implants and grafts: Secondary | ICD-10-CM

## 2017-09-23 DIAGNOSIS — N920 Excessive and frequent menstruation with regular cycle: Secondary | ICD-10-CM | POA: Diagnosis not present

## 2017-09-23 DIAGNOSIS — Z171 Estrogen receptor negative status [ER-]: Secondary | ICD-10-CM | POA: Diagnosis not present

## 2017-09-23 DIAGNOSIS — C50411 Malignant neoplasm of upper-outer quadrant of right female breast: Secondary | ICD-10-CM

## 2017-09-23 LAB — CBC WITH DIFFERENTIAL (CANCER CENTER ONLY)
BASOS ABS: 0 10*3/uL (ref 0.0–0.1)
BASOS PCT: 0 %
EOS ABS: 0 10*3/uL (ref 0.0–0.5)
EOS PCT: 0 %
HCT: 34 % — ABNORMAL LOW (ref 34.8–46.6)
Hemoglobin: 10.9 g/dL — ABNORMAL LOW (ref 11.6–15.9)
Lymphocytes Relative: 13 %
Lymphs Abs: 0.8 10*3/uL — ABNORMAL LOW (ref 0.9–3.3)
MCH: 25.5 pg (ref 25.1–34.0)
MCHC: 32.1 g/dL (ref 31.5–36.0)
MCV: 79.2 fL — ABNORMAL LOW (ref 79.5–101.0)
Monocytes Absolute: 0.3 10*3/uL (ref 0.1–0.9)
Monocytes Relative: 5 %
Neutro Abs: 5.3 10*3/uL (ref 1.5–6.5)
Neutrophils Relative %: 82 %
PLATELETS: 360 10*3/uL (ref 145–400)
RBC: 4.3 MIL/uL (ref 3.70–5.45)
RDW: 28.6 % — AB (ref 11.2–14.5)
WBC Count: 6.5 10*3/uL (ref 3.9–10.3)

## 2017-09-23 LAB — COMPREHENSIVE METABOLIC PANEL
ALT: 16 U/L (ref 14–54)
AST: 21 U/L (ref 15–41)
Albumin: 4.2 g/dL (ref 3.5–5.0)
Alkaline Phosphatase: 61 U/L (ref 38–126)
Anion gap: 12 (ref 5–15)
BILIRUBIN TOTAL: 0.4 mg/dL (ref 0.3–1.2)
BUN: 15 mg/dL (ref 6–20)
CO2: 25 mmol/L (ref 22–32)
CREATININE: 0.93 mg/dL (ref 0.44–1.00)
Calcium: 9.6 mg/dL (ref 8.9–10.3)
Chloride: 102 mmol/L (ref 101–111)
GFR calc Af Amer: >60 mL/min (ref >60)
Glucose, Bld: 119 mg/dL — ABNORMAL HIGH (ref 65–99)
Potassium: 4.3 mmol/L (ref 3.5–5.1)
Sodium: 139 mmol/L (ref 135–145)
TOTAL PROTEIN: 7.8 g/dL (ref 6.5–8.1)

## 2017-09-23 MED ORDER — DIPHENHYDRAMINE HCL 25 MG PO CAPS
50.0000 mg | ORAL_CAPSULE | Freq: Once | ORAL | Status: AC
  Administered 2017-09-23: 50 mg via ORAL

## 2017-09-23 MED ORDER — SODIUM CHLORIDE 0.9 % IV SOLN
510.0000 mg | Freq: Once | INTRAVENOUS | Status: AC
  Administered 2017-09-23: 510 mg via INTRAVENOUS
  Filled 2017-09-23: qty 17

## 2017-09-23 MED ORDER — SODIUM CHLORIDE 0.9 % IV SOLN
420.0000 mg | Freq: Once | INTRAVENOUS | Status: AC
  Administered 2017-09-23: 420 mg via INTRAVENOUS
  Filled 2017-09-23: qty 14

## 2017-09-23 MED ORDER — SODIUM CHLORIDE 0.9 % IV SOLN
700.0000 mg | Freq: Once | INTRAVENOUS | Status: AC
  Administered 2017-09-23: 700 mg via INTRAVENOUS
  Filled 2017-09-23: qty 70

## 2017-09-23 MED ORDER — PALONOSETRON HCL INJECTION 0.25 MG/5ML
0.2500 mg | Freq: Once | INTRAVENOUS | Status: AC
  Administered 2017-09-23: 0.25 mg via INTRAVENOUS

## 2017-09-23 MED ORDER — ACETAMINOPHEN 325 MG PO TABS
650.0000 mg | ORAL_TABLET | Freq: Once | ORAL | Status: AC
  Administered 2017-09-23: 650 mg via ORAL

## 2017-09-23 MED ORDER — HEPARIN SOD (PORK) LOCK FLUSH 100 UNIT/ML IV SOLN
500.0000 [IU] | Freq: Once | INTRAVENOUS | Status: DC | PRN
  Filled 2017-09-23: qty 5

## 2017-09-23 MED ORDER — DIPHENHYDRAMINE HCL 25 MG PO CAPS
ORAL_CAPSULE | ORAL | Status: AC
  Filled 2017-09-23: qty 2

## 2017-09-23 MED ORDER — SODIUM CHLORIDE 0.9 % IV SOLN
75.0000 mg/m2 | Freq: Once | INTRAVENOUS | Status: AC
  Administered 2017-09-23: 170 mg via INTRAVENOUS
  Filled 2017-09-23: qty 17

## 2017-09-23 MED ORDER — ACETAMINOPHEN 325 MG PO TABS
ORAL_TABLET | ORAL | Status: AC
  Filled 2017-09-23: qty 2

## 2017-09-23 MED ORDER — TRASTUZUMAB CHEMO 150 MG IV SOLR
600.0000 mg | Freq: Once | INTRAVENOUS | Status: AC
  Administered 2017-09-23: 600 mg via INTRAVENOUS
  Filled 2017-09-23: qty 28.57

## 2017-09-23 MED ORDER — SODIUM CHLORIDE 0.9% FLUSH
10.0000 mL | INTRAVENOUS | Status: DC | PRN
  Administered 2017-09-23: 10 mL
  Filled 2017-09-23: qty 10

## 2017-09-23 MED ORDER — HEPARIN SOD (PORK) LOCK FLUSH 100 UNIT/ML IV SOLN
500.0000 [IU] | Freq: Once | INTRAVENOUS | Status: AC | PRN
  Administered 2017-09-23: 500 [IU]
  Filled 2017-09-23: qty 5

## 2017-09-23 MED ORDER — PALONOSETRON HCL INJECTION 0.25 MG/5ML
INTRAVENOUS | Status: AC
  Filled 2017-09-23: qty 5

## 2017-09-23 MED ORDER — SODIUM CHLORIDE 0.9 % IV SOLN
Freq: Once | INTRAVENOUS | Status: AC
  Administered 2017-09-23: 12:00:00 via INTRAVENOUS
  Filled 2017-09-23: qty 5

## 2017-09-23 MED ORDER — SODIUM CHLORIDE 0.9 % IV SOLN
Freq: Once | INTRAVENOUS | Status: AC
  Administered 2017-09-23: 10:00:00 via INTRAVENOUS

## 2017-09-23 MED ORDER — ALTEPLASE 2 MG IJ SOLR
2.0000 mg | Freq: Once | INTRAMUSCULAR | Status: DC | PRN
  Filled 2017-09-23: qty 2

## 2017-09-23 NOTE — Progress Notes (Signed)
Patient Care Team: Deland Pretty, MD as PCP - General (Internal Medicine) Baxley, Cresenciano Lick, MD (Internal Medicine)  DIAGNOSIS:  Encounter Diagnosis  Name Primary?  . Malignant neoplasm of upper-outer quadrant of right breast in female, estrogen receptor negative ([place])     SUMMARY OF ONCOLOGIC HISTORY:   Malignant neoplasm of upper-outer quadrant of right breast in female, estrogen receptor negative ([place])   07/09/2017 Initial Diagnosis    Patient palpated a right breast mass which was evaluated by mammogram and ultrasound and a breast MRI which revealed 4.5 x 4 cm necrotic tumor with suspicious multiple right axillary lymph nodes; biopsy revealed IDC grade 3 ER 0%, PR 0%, HER-2 positive ratio 3.08, Ki-67 70%, T2NX stage IIa/IIb      07/18/2017 -  Neo-Adjuvant Chemotherapy    TCH Perjeta x6 cycles followed by Herceptin Perjeta maintenance      08/15/2017 Genetic Testing    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  The Multi-Gene Panel offered by [facility] includes sequencing and/or deletion duplication testing of the following 83 genes: ALK, APC, ATM, AXIN2,BAP1,  BARD1, BLM, BMPR1A, BRCA1, BRCA2, BRIP1, CASR, CDC73, CDH1, CDK4, CDKN1B, CDKN1C, CDKN2A (p14ARF), CDKN2A (p16INK4a), CEBPA, CHEK2, CTNNA1, DICER1, DIS3L2, EGFR (c.2369C>T, p.Thr790Met variant only), EPCAM (Deletion/duplication testing only), FH, FLCN, GATA2, GPC3, GREM1 (Promoter region deletion/duplication testing only), HOXB13 (c.251G>A, p.Gly84Glu), HRAS, KIT, MAX, MEN1, MET, MITF (c.952G>A, p.Glu318Lys variant only), MLH1, MSH2, MSH3, MSH6, MUTYH, NBN, NF1, NF2, NTHL1, PALB2, PDGFRA, PHOX2B, PMS2, POLD1, POLE, POT1, PRKAR1A, PTCH1, PTEN, RAD50, RAD51C, RAD51D, RB1, RECQL4, RET, RUNX1, SDHAF2, SDHA (sequence changes only), SDHB, SDHC, SDHD, SMAD4, SMARCA4, SMARCB1, SMARCE1, STK11, SUFU, TERT, TERT, TMEM127, TP53, TSC1, TSC2, VHL, WRN and WT1.  The report date is August 15, 2017.       CHIEF COMPLIANT:  Cycle 4 TCH Perjeta  INTERVAL HISTORY: Rebecca [name] is a 47 year old with above-mentioned history of right breast cancer who is currently on neoadjuvant chemotherapy and today is cycle 4 of the treatment.  Overall she is tolerating the treatment moderately well.  She does have body aches and pains from Neulasta.  She also has fatigue.  She received IV iron treatment for iron deficiency anemia with the last round of chemo.  She will receive the second dose of IV iron today.  She requires iron because of heavy menstrual cycle related iron deficiency.  REVIEW OF SYSTEMS:   Constitutional: complains of fatigue Eyes: Denies blurriness of vision Ears, nose, mouth, throat, and face: Denies mucositis or sore throat Respiratory: Denies cough, dyspnea or wheezes Cardiovascular: Denies palpitation, chest discomfort Gastrointestinal:  Denies nausea, heartburn or change in bowel habits Skin: Denies abnormal skin rashes Lymphatics: Denies new lymphadenopathy or easy bruising Neurological:Denies numbness, tingling or new weaknesses Behavioral/Psych: Mood is stable, no new changes  Extremities: No lower extremity edema All other systems were reviewed with the patient and are negative.  I have reviewed the past medical history, past surgical history, social history and family history with the patient and they are unchanged from previous note.  ALLERGIES:  has No Known Allergies.  MEDICATIONS:  Current Outpatient Medications  Medication Sig Dispense Refill  . dexamethasone (DECADRON) 4 MG tablet Take 1 tablet (4 mg total) by mouth daily. Take 1 tablet day before chemo and 1 tablet day after with food 12 tablet 0  . esomeprazole (NEXIUM) 40 MG capsule Nexium 40 mg capsule,delayed release  Take 1 capsule every day by oral route.    Marland Kitchen HYDROcodone-acetaminophen (NORCO) 10-325  MG tablet Take 1 tablet by mouth every 6 (six) hours as needed. (Patient not taking: Reported on 07/23/2017) 10 tablet 0  .  levothyroxine (SYNTHROID, LEVOTHROID) 137 MCG tablet Take 137 mcg by mouth daily before breakfast.    . lidocaine-prilocaine (EMLA) cream Apply to affected area once 30 g 3  . LORazepam (ATIVAN) 0.5 MG tablet Take 1 tablet (0.5 mg total) by mouth at bedtime as needed for sleep. (Patient not taking: Reported on 07/23/2017) 30 tablet 0  . LORazepam (ATIVAN) 1 MG tablet Take 1 tablet (1 mg total) by mouth every 6 (six) hours as needed for anxiety. (Patient not taking: Reported on 07/23/2017) 12 tablet 0  . magic mouthwash w/lidocaine SOLN Take 5 mLs by mouth 3 (three) times daily as needed for mouth pain. 200 mL 0  . metFORMIN (GLUCOPHAGE) 500 MG tablet Take by mouth daily with breakfast.    . ondansetron (ZOFRAN) 8 MG tablet Take 1 tablet (8 mg total) by mouth 2 (two) times daily as needed (Nausea or vomiting). Begin 4 days after chemotherapy. 30 tablet 3  . prochlorperazine (COMPAZINE) 10 MG tablet Take 1 tablet (10 mg total) by mouth every 6 (six) hours as needed (Nausea or vomiting). 30 tablet 1  . propranolol (INNOPRAN XL) 120 MG 24 hr capsule Take 120 mg by mouth at bedtime.    . triamterene-hydrochlorothiazide (MAXZIDE) 75-50 MG tablet One po daily prn lower extremity edema (Patient not taking: Reported on 07/23/2017) 30 tablet 2   No current facility-administered medications for this visit.    Facility-Administered Medications Ordered in Other Visits  Medication Dose Route Frequency Provider Last Rate Last Dose  . LORazepam (ATIVAN) injection 1 mg  1 mg Intravenous Once Harle Stanford., PA-C        PHYSICAL EXAMINATION: ECOG PERFORMANCE STATUS: 1 - Symptomatic but completely ambulatory  There were no vitals filed for this visit. There were no vitals filed for this visit.  GENERAL:alert, no distress and comfortable SKIN: skin color, texture, turgor are normal, no rashes or significant lesions EYES: normal, Conjunctiva are pink and non-injected, sclera clear OROPHARYNX:no exudate, no  erythema and lips, buccal mucosa, and tongue normal  NECK: supple, thyroid normal size, non-tender, without nodularity LYMPH:  no palpable lymphadenopathy in the cervical, axillary or inguinal LUNGS: clear to auscultation and percussion with normal breathing effort HEART: regular rate & rhythm and no murmurs and no lower extremity edema ABDOMEN:abdomen soft, non-tender and normal bowel sounds MUSCULOSKELETAL:no cyanosis of digits and no clubbing  NEURO: alert & oriented x 3 with fluent speech, no focal motor/sensory deficits EXTREMITIES: No lower extremity edema  LABORATORY DATA:  I have reviewed the data as listed CMP Latest Ref Rng & Units 09/01/2017 08/08/2017 07/25/2017  Glucose 70 - 140 mg/dL 168(H) 100 120  BUN 7 - 26 mg/dL _0 Creatinine 0.60 - 1.10 mg/dL 0.95 0.99 0.88  Sodium 136 - 145 mmol/L 139 139 139  Potassium 3.5 - 5.1 mmol/L 4.1 4.0 4.4  Chloride 98 - 109 mmol/L 106 103 106  CO2 22 - 29 mmol/L _1 Calcium 8.4 - 10.4 mg/dL 9.7 9.3 9.0  Total Protein 6.4 - 8.3 g/dL 7.5 6.9 6.4  Total Bilirubin 0.2 - 1.2 mg/dL 0.3 0.4 <0.2(L)  Alkaline Phos 40 - 150 U/L 73 59 55  AST 5 - 34 U/L _2 ALT 0 - 55 U/L _3 Lab Results  Component Value Date  WBC 6.3 09/01/2017   HGB 10.0 (L) 09/01/2017   HCT 31.0 (L) 09/01/2017   MCV 74.0 (L) 09/01/2017   PLT 418 (H) 09/01/2017   NEUTROABS 5.5 09/01/2017    ASSESSMENT & PLAN:  Malignant neoplasm of upper-outer quadrant of right breast in female, estrogen receptor negative ([place]) 07/09/2017:Patient palpated a right breast mass which was evaluated by mammogram and ultrasound and a breast MRI which revealed 4.5 x 4 cm necrotic tumor with suspicious multiple right axillary lymph nodes; biopsy revealed IDC grade 3 ER 0%, PR 0%, HER-2 positive ratio 3.08, Ki-67 70%, T2NX stage IIa/IIb  Recommendationbased on multidisciplinary tumor board: 1. Neoadjuvant chemotherapy with TCH Perjeta 6 cycles followed by Herceptinand  Perjetamaintenance for 1 year 2. Followed bybilateral mastectomies with targeted node dissection on the right 3. Followed by adjuvant radiation therapy ----------------------------------------------------------------------------- Current treatment: Cycle 4 day 1 TCH Perjeta Chemo toxicities: 1.Neulasta related bone pain: 2.throat pain: Due to mucositis sent Magic mouthwash 3.Leukopenia is expected: No change in the dosing 4.Microcytic anemia: Ferritin 24, iron saturation 9%, TIBC 441 I recommended IV iron therapy 5.Fatigue: TSH and thyroid studies do not show evidence of hypothyroidism  Iron deficiency anemia: Due to heavy menstrual bleeding.  I recommended intravenous iron therapy today and the next dose will be in 3 weeks with her next cycle. Heavy menstrual cycles: We will consider giving Zoladex if it continues Return to clinic in 3 weeks for cycle 5    No orders of the defined types were placed in this encounter.  The patient has a good understanding of the overall plan. she agrees with it. she will call with any problems that may develop before the next visit here.   Harriette Ohara, MD 09/23/17

## 2017-09-23 NOTE — Patient Instructions (Signed)
[facility] Discharge Instructions for Patients Receiving Chemotherapy  Today you received the following chemotherapy agents: Herceptin, Perjeta, Taxotere, Carboplatin.  To help prevent nausea and vomiting after your treatment, we encourage you to take your nausea medication as prescribed. If you develop nausea and vomiting that is not controlled by your nausea medication, call the clinic.   BELOW ARE SYMPTOMS THAT SHOULD BE REPORTED IMMEDIATELY:  *FEVER GREATER THAN 100.5 F  *CHILLS WITH OR WITHOUT FEVER  NAUSEA AND VOMITING THAT IS NOT CONTROLLED WITH YOUR NAUSEA MEDICATION  *UNUSUAL SHORTNESS OF BREATH  *UNUSUAL BRUISING OR BLEEDING  TENDERNESS IN MOUTH AND THROAT WITH OR WITHOUT PRESENCE OF ULCERS  *URINARY PROBLEMS  *BOWEL PROBLEMS  UNUSUAL RASH Items with * indicate a potential emergency and should be followed up as soon as possible.  Feel free to call the clinic should you have any questions or concerns. The clinic phone number is [phone].  Please show the CHEMO ALERT CARD at check-in to the Emergency Department and triage nurse.   

## 2017-09-23 NOTE — Telephone Encounter (Signed)
Gave avs and calendar ° °

## 2017-09-23 NOTE — Assessment & Plan Note (Signed)
07/09/2017:Patient palpated a right breast mass which was evaluated by mammogram and ultrasound and a breast MRI which revealed 4.5 x 4 cm necrotic tumor with suspicious multiple right axillary lymph nodes; biopsy revealed IDC grade 3 ER 0%, PR 0%, HER-2 positive ratio 3.08, Ki-67 70%, T2NX stage IIa/IIb  Recommendationbased on multidisciplinary tumor board: 1. Neoadjuvant chemotherapy with TCH Perjeta 6 cycles followed by Herceptinand Perjetamaintenance for 1 year 2. Followed bybilateral mastectomies with targeted node dissection on the right 3. Followed by adjuvant radiation therapy ----------------------------------------------------------------------------- Current treatment: Cycle 4 day 1 TCH Perjeta Chemo toxicities: 1.Neulasta related bone pain: 2.throat pain: Due to mucositis sent Magic mouthwash 3.Leukopenia is expected: No change in the dosing 4.Microcytic anemia: Ferritin 24, iron saturation 9%, TIBC 441 I recommended IV iron therapy 5.Fatigue: TSH and thyroid studies do not show evidence of hypothyroidism  Iron deficiency anemia: Due to heavy menstrual bleeding.  I recommended intravenous iron therapy today and the next dose will be in 3 weeks with her next cycle. Heavy menstrual cycles: We will consider giving Zoladex if it continues Return to clinic in 3 weeks for cycle 5

## 2017-09-25 ENCOUNTER — Inpatient Hospital Stay: Payer: BLUE CROSS/BLUE SHIELD

## 2017-09-25 VITALS — BP 130/85 | HR 76 | Temp 98.0°F | Resp 18

## 2017-09-25 DIAGNOSIS — C50411 Malignant neoplasm of upper-outer quadrant of right female breast: Secondary | ICD-10-CM | POA: Diagnosis not present

## 2017-09-25 DIAGNOSIS — Z171 Estrogen receptor negative status [ER-]: Principal | ICD-10-CM

## 2017-09-25 MED ORDER — PEGFILGRASTIM INJECTION 6 MG/0.6ML ~~LOC~~
6.0000 mg | PREFILLED_SYRINGE | Freq: Once | SUBCUTANEOUS | Status: AC
  Administered 2017-09-25: 6 mg via SUBCUTANEOUS

## 2017-09-25 NOTE — Patient Instructions (Signed)
Pegfilgrastim injection What is this medicine? PEGFILGRASTIM (PEG fil gra stim) is a long-acting granulocyte colony-stimulating factor that stimulates the growth of neutrophils, a type of white blood cell important in the body's fight against infection. It is used to reduce the incidence of fever and infection in patients with certain types of cancer who are receiving chemotherapy that affects the bone marrow, and to increase survival after being exposed to high doses of radiation. This medicine may be used for other purposes; ask your health care provider or pharmacist if you have questions. COMMON BRAND NAME(S): Neulasta What should I tell my health care provider before I take this medicine? They need to know if you have any of these conditions: -kidney disease -latex allergy -ongoing radiation therapy -sickle cell disease -skin reactions to acrylic adhesives (On-Body Injector only) -an unusual or allergic reaction to pegfilgrastim, filgrastim, other medicines, foods, dyes, or preservatives -pregnant or trying to get pregnant -breast-feeding How should I use this medicine? This medicine is for injection under the skin. If you get this medicine at home, you will be taught how to prepare and give the pre-filled syringe or how to use the On-body Injector. Refer to the patient Instructions for Use for detailed instructions. Use exactly as directed. Tell your healthcare provider immediately if you suspect that the On-body Injector may not have performed as intended or if you suspect the use of the On-body Injector resulted in a missed or partial dose. It is important that you put your used needles and syringes in a special sharps container. Do not put them in a trash can. If you do not have a sharps container, call your pharmacist or healthcare provider to get one. Talk to your pediatrician regarding the use of this medicine in children. While this drug may be prescribed for selected conditions,  precautions do apply. Overdosage: If you think you have taken too much of this medicine contact a poison control center or emergency room at once. NOTE: This medicine is only for you. Do not share this medicine with others. What if I miss a dose? It is important not to miss your dose. Call your doctor or health care professional if you miss your dose. If you miss a dose due to an On-body Injector failure or leakage, a new dose should be administered as soon as possible using a single prefilled syringe for manual use. What may interact with this medicine? Interactions have not been studied. Give your health care provider a list of all the medicines, herbs, non-prescription drugs, or dietary supplements you use. Also tell them if you smoke, drink alcohol, or use illegal drugs. Some items may interact with your medicine. This list may not describe all possible interactions. Give your health care provider a list of all the medicines, herbs, non-prescription drugs, or dietary supplements you use. Also tell them if you smoke, drink alcohol, or use illegal drugs. Some items may interact with your medicine. What should I watch for while using this medicine? You may need blood work done while you are taking this medicine. If you are going to need a MRI, CT scan, or other procedure, tell your doctor that you are using this medicine (On-Body Injector only). What side effects may I notice from receiving this medicine? Side effects that you should report to your doctor or health care professional as soon as possible: -allergic reactions like skin rash, itching or hives, swelling of the face, lips, or tongue -dizziness -fever -pain, redness, or irritation at site   where injected -pinpoint red spots on the skin -red or dark-brown urine -shortness of breath or breathing problems -stomach or side pain, or pain at the shoulder -swelling -tiredness -trouble passing urine or change in the amount of urine Side  effects that usually do not require medical attention (report to your doctor or health care professional if they continue or are bothersome): -bone pain -muscle pain This list may not describe all possible side effects. Call your doctor for medical advice about side effects. You may report side effects to FDA at 1-800-FDA-1088. Where should I keep my medicine? Keep out of the reach of children. Store pre-filled syringes in a refrigerator between 2 and 8 degrees C (36 and 46 degrees F). Do not freeze. Keep in carton to protect from light. Throw away this medicine if it is left out of the refrigerator for more than 48 hours. Throw away any unused medicine after the expiration date. NOTE: This sheet is a summary. It may not cover all possible information. If you have questions about this medicine, talk to your doctor, pharmacist, or health care provider.  2018 [employer] (2016-04-11 12:58:03)  

## 2017-10-14 ENCOUNTER — Inpatient Hospital Stay: Payer: BLUE CROSS/BLUE SHIELD | Attending: Hematology and Oncology

## 2017-10-14 ENCOUNTER — Inpatient Hospital Stay (HOSPITAL_BASED_OUTPATIENT_CLINIC_OR_DEPARTMENT_OTHER): Payer: BLUE CROSS/BLUE SHIELD | Admitting: Hematology and Oncology

## 2017-10-14 ENCOUNTER — Inpatient Hospital Stay: Payer: BLUE CROSS/BLUE SHIELD

## 2017-10-14 ENCOUNTER — Encounter: Payer: Self-pay | Admitting: *Deleted

## 2017-10-14 DIAGNOSIS — N920 Excessive and frequent menstruation with regular cycle: Secondary | ICD-10-CM

## 2017-10-14 DIAGNOSIS — Z79899 Other long term (current) drug therapy: Secondary | ICD-10-CM

## 2017-10-14 DIAGNOSIS — K649 Unspecified hemorrhoids: Secondary | ICD-10-CM | POA: Diagnosis not present

## 2017-10-14 DIAGNOSIS — D500 Iron deficiency anemia secondary to blood loss (chronic): Secondary | ICD-10-CM

## 2017-10-14 DIAGNOSIS — K1230 Oral mucositis (ulcerative), unspecified: Secondary | ICD-10-CM | POA: Diagnosis not present

## 2017-10-14 DIAGNOSIS — Z5111 Encounter for antineoplastic chemotherapy: Secondary | ICD-10-CM | POA: Diagnosis not present

## 2017-10-14 DIAGNOSIS — Z171 Estrogen receptor negative status [ER-]: Secondary | ICD-10-CM

## 2017-10-14 DIAGNOSIS — C50411 Malignant neoplasm of upper-outer quadrant of right female breast: Secondary | ICD-10-CM

## 2017-10-14 DIAGNOSIS — Z95828 Presence of other vascular implants and grafts: Secondary | ICD-10-CM

## 2017-10-14 LAB — CBC WITH DIFFERENTIAL (CANCER CENTER ONLY)
BASOS ABS: 0 10*3/uL (ref 0.0–0.1)
Basophils Relative: 0 %
Eosinophils Absolute: 0 10*3/uL (ref 0.0–0.5)
Eosinophils Relative: 0 %
HEMATOCRIT: 33.9 % — AB (ref 34.8–46.6)
Hemoglobin: 10.6 g/dL — ABNORMAL LOW (ref 11.6–15.9)
LYMPHS PCT: 27 %
Lymphs Abs: 1.9 10*3/uL (ref 0.9–3.3)
MCH: 27.2 pg (ref 25.1–34.0)
MCHC: 31.3 g/dL — ABNORMAL LOW (ref 31.5–36.0)
MCV: 86.9 fL (ref 79.5–101.0)
Monocytes Absolute: 0.8 10*3/uL (ref 0.1–0.9)
Monocytes Relative: 11 %
NEUTROS ABS: 4.4 10*3/uL (ref 1.5–6.5)
Neutrophils Relative %: 62 %
Platelet Count: 272 10*3/uL (ref 145–400)
RBC: 3.9 MIL/uL (ref 3.70–5.45)
RDW: 25.6 % — ABNORMAL HIGH (ref 11.2–14.5)
WBC: 7.1 10*3/uL (ref 3.9–10.3)

## 2017-10-14 LAB — CMP (CANCER CENTER ONLY)
ALBUMIN: 4 g/dL (ref 3.5–5.0)
ALT: 15 U/L (ref 0–55)
AST: 17 U/L (ref 5–34)
Alkaline Phosphatase: 55 U/L (ref 40–150)
Anion gap: 9 (ref 3–11)
BILIRUBIN TOTAL: 0.2 mg/dL (ref 0.2–1.2)
BUN: 14 mg/dL (ref 7–26)
CHLORIDE: 105 mmol/L (ref 98–109)
CO2: 28 mmol/L (ref 22–29)
Calcium: 9.5 mg/dL (ref 8.4–10.4)
Creatinine: 0.95 mg/dL (ref 0.60–1.10)
GFR, Est AFR Am: >60 mL/min (ref >60)
Glucose, Bld: 99 mg/dL (ref 70–140)
POTASSIUM: 3.9 mmol/L (ref 3.5–5.1)
Sodium: 142 mmol/L (ref 136–145)
TOTAL PROTEIN: 6.7 g/dL (ref 6.4–8.3)

## 2017-10-14 MED ORDER — HEPARIN SOD (PORK) LOCK FLUSH 100 UNIT/ML IV SOLN
500.0000 [IU] | Freq: Once | INTRAVENOUS | Status: AC | PRN
  Administered 2017-10-14: 500 [IU]
  Filled 2017-10-14: qty 5

## 2017-10-14 MED ORDER — SODIUM CHLORIDE 0.9 % IV SOLN
700.0000 mg | Freq: Once | INTRAVENOUS | Status: AC
  Administered 2017-10-14: 700 mg via INTRAVENOUS
  Filled 2017-10-14: qty 70

## 2017-10-14 MED ORDER — ACETAMINOPHEN 325 MG PO TABS
ORAL_TABLET | ORAL | Status: AC
  Filled 2017-10-14: qty 2

## 2017-10-14 MED ORDER — TRASTUZUMAB CHEMO 150 MG IV SOLR
600.0000 mg | Freq: Once | INTRAVENOUS | Status: AC
  Administered 2017-10-14: 600 mg via INTRAVENOUS
  Filled 2017-10-14: qty 28.57

## 2017-10-14 MED ORDER — PALONOSETRON HCL INJECTION 0.25 MG/5ML
0.2500 mg | Freq: Once | INTRAVENOUS | Status: AC
  Administered 2017-10-14: 0.25 mg via INTRAVENOUS

## 2017-10-14 MED ORDER — SODIUM CHLORIDE 0.9 % IV SOLN
60.0000 mg/m2 | Freq: Once | INTRAVENOUS | Status: AC
  Administered 2017-10-14: 130 mg via INTRAVENOUS
  Filled 2017-10-14: qty 13

## 2017-10-14 MED ORDER — PALONOSETRON HCL INJECTION 0.25 MG/5ML
INTRAVENOUS | Status: AC
  Filled 2017-10-14: qty 5

## 2017-10-14 MED ORDER — DIPHENHYDRAMINE HCL 25 MG PO CAPS
50.0000 mg | ORAL_CAPSULE | Freq: Once | ORAL | Status: AC
  Administered 2017-10-14: 50 mg via ORAL

## 2017-10-14 MED ORDER — DIPHENHYDRAMINE HCL 25 MG PO CAPS
ORAL_CAPSULE | ORAL | Status: AC
  Filled 2017-10-14: qty 2

## 2017-10-14 MED ORDER — SODIUM CHLORIDE 0.9% FLUSH
10.0000 mL | INTRAVENOUS | Status: DC | PRN
  Administered 2017-10-14: 10 mL
  Filled 2017-10-14: qty 10

## 2017-10-14 MED ORDER — SODIUM CHLORIDE 0.9 % IV SOLN
420.0000 mg | Freq: Once | INTRAVENOUS | Status: AC
  Administered 2017-10-14: 420 mg via INTRAVENOUS
  Filled 2017-10-14: qty 14

## 2017-10-14 MED ORDER — SODIUM CHLORIDE 0.9 % IV SOLN
Freq: Once | INTRAVENOUS | Status: AC
  Administered 2017-10-14: 11:00:00 via INTRAVENOUS
  Filled 2017-10-14: qty 5

## 2017-10-14 MED ORDER — ACETAMINOPHEN 325 MG PO TABS
650.0000 mg | ORAL_TABLET | Freq: Once | ORAL | Status: AC
  Administered 2017-10-14: 650 mg via ORAL

## 2017-10-14 MED ORDER — SODIUM CHLORIDE 0.9 % IV SOLN
Freq: Once | INTRAVENOUS | Status: AC
Start: 2017-10-14 — End: 2017-10-14
  Administered 2017-10-14: 10:00:00 via INTRAVENOUS

## 2017-10-14 NOTE — Assessment & Plan Note (Signed)
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 Neoadjuvant chemotherapy with TCH Perjeta 6 cycles followed by Herceptinand Perjetamaintenance for 1 year 2. Followed bybilateral mastectomies with targeted node dissection on the right 3. Followed by adjuvant radiation therapy ----------------------------------------------------------------------------- Current treatment: Cycle4day 1TCH Perjeta  Chemo toxicities: 1.Neulasta related bone pain: 2.throat pain: Due to mucositis sent Magic mouthwash 3.Leukopenia is expected: No change in the dosing 4.Microcytic anemia:Ferritin 24, iron saturation 9%, TIBC 441 received IV iron therapy 5/6 and 09/23/2017 5.Fatigue: TSH and thyroid studiesdo not show evidence of hypothyroidism  Iron deficiency anemia: Due to heavy menstrual bleeding.  Heavy menstrual cycles: We will consider giving Zoladex if it continues Return to clinic in3weeks for cycle 6

## 2017-10-14 NOTE — Progress Notes (Signed)
Patient Care Team: Deland Pretty, MD as PCP - General (Internal Medicine) Baxley, Cresenciano Lick, MD (Internal Medicine)  DIAGNOSIS:  Encounter Diagnosis  Name Primary?  . Malignant neoplasm of upper-outer quadrant of right breast in female, estrogen receptor negative ([place])     SUMMARY OF ONCOLOGIC HISTORY:   Malignant neoplasm of upper-outer quadrant of right breast in female, estrogen receptor negative ([place])   07/09/2017 Initial Diagnosis    Patient palpated a right breast mass which was evaluated by mammogram and ultrasound and a breast MRI which revealed 4.5 x 4 cm necrotic tumor with suspicious multiple right axillary lymph nodes; biopsy revealed IDC grade 3 ER 0%, PR 0%, HER-2 positive ratio 3.08, Ki-67 70%, T2NX stage IIa/IIb      07/18/2017 -  Neo-Adjuvant Chemotherapy    TCH Perjeta x6 cycles followed by Herceptin Perjeta maintenance      08/15/2017 Genetic Testing    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  The Multi-Gene Panel offered by [facility] includes sequencing and/or deletion duplication testing of the following 83 genes: ALK, APC, ATM, AXIN2,BAP1,  BARD1, BLM, BMPR1A, BRCA1, BRCA2, BRIP1, CASR, CDC73, CDH1, CDK4, CDKN1B, CDKN1C, CDKN2A (p14ARF), CDKN2A (p16INK4a), CEBPA, CHEK2, CTNNA1, DICER1, DIS3L2, EGFR (c.2369C>T, p.Thr790Met variant only), EPCAM (Deletion/duplication testing only), FH, FLCN, GATA2, GPC3, GREM1 (Promoter region deletion/duplication testing only), HOXB13 (c.251G>A, p.Gly84Glu), HRAS, KIT, MAX, MEN1, MET, MITF (c.952G>A, p.Glu318Lys variant only), MLH1, MSH2, MSH3, MSH6, MUTYH, NBN, NF1, NF2, NTHL1, PALB2, PDGFRA, PHOX2B, PMS2, POLD1, POLE, POT1, PRKAR1A, PTCH1, PTEN, RAD50, RAD51C, RAD51D, RB1, RECQL4, RET, RUNX1, SDHAF2, SDHA (sequence changes only), SDHB, SDHC, SDHD, SMAD4, SMARCA4, SMARCB1, SMARCE1, STK11, SUFU, TERT, TERT, TMEM127, TP53, TSC1, TSC2, VHL, WRN and WT1.  The report date is August 15, 2017.       CHIEF COMPLIANT:  Cycle 5 TCH Perjeta  INTERVAL HISTORY: [name] is a 47 year old with above-mentioned history of right breast cancer currently on neoadjuvant chemotherapy with [place] and today is cycle 5 of treatment.  After cycle 4 of chemo she felt miserable.  She had weakness fatigue diarrhea which alternate with constipation which caused hemorrhoidal bleeding that lasted almost 10 days.  However she is able to work full-time.  She also complains that Neulasta causes bone pain.  Denies any fevers or chills.  She noticed tingling and numbness in the tips of the fingers intermittently.  She reports that she felt a small nodule in the left breast as well. She has a lot of stressors at home.  REVIEW OF SYSTEMS:   Constitutional: Denies fevers, chills or abnormal weight loss Eyes: Denies blurriness of vision Ears, nose, mouth, throat, and face: Denies mucositis or sore throat Respiratory: Denies cough, dyspnea or wheezes Cardiovascular: Denies palpitation, chest discomfort Gastrointestinal:  Denies nausea, heartburn or change in bowel habits Skin: Denies abnormal skin rashes Lymphatics: Denies new lymphadenopathy or easy bruising Neurological: Grade 1 peripheral neuropathy Behavioral/Psych: Mood is stable, no new changes  Extremities: No lower extremity edema Breast: Right breast cancer appears to have improved to clinical palpation.  She felt a small lump in the left breast. All other systems were reviewed with the patient and are negative.  I have reviewed the past medical history, past surgical history, social history and family history with the patient and they are unchanged from previous note.  ALLERGIES:  has No Known Allergies.  MEDICATIONS:  Current Outpatient Medications  Medication Sig Dispense Refill  . dexamethasone (DECADRON) 4 MG tablet Take 1 tablet (4 mg  total) by mouth daily. Take 1 tablet day before chemo and 1 tablet day after with food 12 tablet 0  . esomeprazole (NEXIUM)  40 MG capsule Nexium 40 mg capsule,delayed release  Take 1 capsule every day by oral route.    Marland Kitchen HYDROcodone-acetaminophen (NORCO) 10-325 MG tablet Take 1 tablet by mouth every 6 (six) hours as needed. (Patient not taking: Reported on 07/23/2017) 10 tablet 0  . levothyroxine (SYNTHROID, LEVOTHROID) 137 MCG tablet Take 137 mcg by mouth daily before breakfast.    . lidocaine-prilocaine (EMLA) cream Apply to affected area once 30 g 3  . LORazepam (ATIVAN) 0.5 MG tablet Take 1 tablet (0.5 mg total) by mouth at bedtime as needed for sleep. (Patient not taking: Reported on 07/23/2017) 30 tablet 0  . LORazepam (ATIVAN) 1 MG tablet Take 1 tablet (1 mg total) by mouth every 6 (six) hours as needed for anxiety. (Patient not taking: Reported on 07/23/2017) 12 tablet 0  . magic mouthwash w/lidocaine SOLN Take 5 mLs by mouth 3 (three) times daily as needed for mouth pain. 200 mL 0  . metFORMIN (GLUCOPHAGE) 500 MG tablet Take by mouth daily with breakfast.    . ondansetron (ZOFRAN) 8 MG tablet Take 1 tablet (8 mg total) by mouth 2 (two) times daily as needed (Nausea or vomiting). Begin 4 days after chemotherapy. 30 tablet 3  . prochlorperazine (COMPAZINE) 10 MG tablet Take 1 tablet (10 mg total) by mouth every 6 (six) hours as needed (Nausea or vomiting). 30 tablet 1  . propranolol (INNOPRAN XL) 120 MG 24 hr capsule Take 120 mg by mouth at bedtime.    . triamterene-hydrochlorothiazide (MAXZIDE) 75-50 MG tablet One po daily prn lower extremity edema (Patient not taking: Reported on 07/23/2017) 30 tablet 2   No current facility-administered medications for this visit.    Facility-Administered Medications Ordered in Other Visits  Medication Dose Route Frequency Provider Last Rate Last Dose  . 0.9 %  sodium chloride infusion   Intravenous Once Nicholas Lose, MD      . acetaminophen (TYLENOL) tablet 650 mg  650 mg Oral Once Nicholas Lose, MD      . CARBOplatin (PARAPLATIN) 700 mg in sodium chloride 0.9 % 250 mL chemo  infusion  700 mg Intravenous Once Nicholas Lose, MD      . diphenhydrAMINE (BENADRYL) capsule 50 mg  50 mg Oral Once Nicholas Lose, MD      . DOCEtaxel (TAXOTERE) 130 mg in sodium chloride 0.9 % 250 mL chemo infusion  60 mg/m2 (Treatment Plan Recorded) Intravenous Once Nicholas Lose, MD      . fosaprepitant (EMEND) 150 mg, dexamethasone (DECADRON) 12 mg in sodium chloride 0.9 % 145 mL IVPB   Intravenous Once Nicholas Lose, MD      . heparin lock flush 100 unit/mL  500 Units Intracatheter Once PRN Nicholas Lose, MD      . LORazepam (ATIVAN) injection 1 mg  1 mg Intravenous Once Harle Stanford., PA-C      . palonosetron (ALOXI) injection 0.25 mg  0.25 mg Intravenous Once Nicholas Lose, MD      . pertuzumab (PERJETA) 420 mg in sodium chloride 0.9 % 250 mL chemo infusion  420 mg Intravenous Once Nicholas Lose, MD      . sodium chloride flush (NS) 0.9 % injection 10 mL  10 mL Intracatheter PRN Nicholas Lose, MD      . trastuzumab (HERCEPTIN) 609 mg in sodium chloride 0.9 % 250 mL chemo infusion  6 mg/kg (Treatment Plan Recorded) Intravenous Once Nicholas Lose, MD        PHYSICAL EXAMINATION: ECOG PERFORMANCE STATUS: 1 - Symptomatic but completely ambulatory  Vitals:   10/14/17 0926  BP: 140/90  Pulse: 72  Resp: 18  Temp: 98.1 F (36.7 C)  SpO2: 100%   Filed Weights   10/14/17 0926  Weight: 228 lb 14.4 oz (103.8 kg)    GENERAL:alert, no distress and comfortable SKIN: skin color, texture, turgor are normal, no rashes or significant lesions EYES: normal, Conjunctiva are pink and non-injected, sclera clear OROPHARYNX:no exudate, no erythema and lips, buccal mucosa, and tongue normal  NECK: supple, thyroid normal size, non-tender, without nodularity LYMPH:  no palpable lymphadenopathy in the cervical, axillary or inguinal LUNGS: clear to auscultation and percussion with normal breathing effort HEART: regular rate & rhythm and no murmurs and no lower extremity edema ABDOMEN:abdomen soft,  non-tender and normal bowel sounds MUSCULOSKELETAL:no cyanosis of digits and no clubbing  NEURO: alert & oriented x 3 with fluent speech, no focal motor/sensory deficits EXTREMITIES: No lower extremity edema  LABORATORY DATA:  I have reviewed the data as listed CMP Latest Ref Rng & Units 10/14/2017 09/23/2017 09/01/2017  Glucose 70 - 140 mg/dL 99 119(H) 168(H)  BUN 7 - 26 mg/dL 14 15 16   Creatinine 0.60 - 1.10 mg/dL 0.95 0.93 0.95  Sodium 136 - 145 mmol/L 142 139 139  Potassium 3.5 - 5.1 mmol/L 3.9 4.3 4.1  Chloride 98 - 109 mmol/L 105 102 106  CO2 22 - 29 mmol/L 28 25 24   Calcium 8.4 - 10.4 mg/dL 9.5 9.6 9.7  Total Protein 6.4 - 8.3 g/dL 6.7 7.8 7.5  Total Bilirubin 0.2 - 1.2 mg/dL 0.2 0.4 0.3  Alkaline Phos 40 - 150 U/L 55 61 73  AST 5 - 34 U/L 17 21 13   ALT 0 - 55 U/L 15 16 12     Lab Results  Component Value Date   WBC 7.1 10/14/2017   HGB 10.6 (L) 10/14/2017   HCT 33.9 (L) 10/14/2017   MCV 86.9 10/14/2017   PLT 272 10/14/2017   NEUTROABS 4.4 10/14/2017    ASSESSMENT & PLAN:  Malignant neoplasm of upper-outer quadrant of right breast in female, estrogen receptor negative ([facility]) /13/2019:Patient palpated a right breast mass which was evaluated by mammogram and ultrasound and a breast MRI which revealed 4.5 x 4 cm necrotic tumor with suspicious multiple right axillary lymph nodes; biopsy revealed IDC grade 3 ER 0%, PR 0%, HER-2 positive ratio 3.08, Ki-67 70%, T2NX stage IIa/IIb  Treatment planbased on multidisciplinary tumor board: 1. Neoadjuvant chemotherapy with TCH Perjeta 6 cycles followed by Herceptinand Perjetamaintenance for 1 year 2. Followed bybilateral mastectomies with targeted node dissection on the right 3. Followed by adjuvant radiation therapy ----------------------------------------------------------------------------- Current treatment: Cycle5day 1TCH Perjeta  Chemo toxicities: 1.Neulasta related bone pain: 2.throat pain: Due to mucositis sent  Magic mouthwash 3.Leukopenia is expected: No change in the dosing 4.Microcytic anemia:Ferritin 24, iron saturation 9%, TIBC 441 received IV iron therapy 5/6 and 09/23/2017 5.Fatigue: TSH and thyroid studiesdo not show evidence of hypothyroidism. this is chemotherapy-induced. 6. Chemo-induced peripheral neuropathy grade 1: I will decrease the dosage of Taxotere. Iron deficiency anemia: Due to heavy menstrual bleeding.  Received IV iron.  We spent a lot of time discussing that she needs to evaluate how she handles stress and her family needs to be more supportive. It is a lack of control that she is struggling with.  Return to clinic in3weeks  for cycle 6  No orders of the defined types were placed in this encounter.  The patient has a good understanding of the overall plan. she agrees with it. she will call with any problems that may develop before the next visit here.   Harriette Ohara, MD 10/14/17

## 2017-10-14 NOTE — Patient Instructions (Signed)
[facility] Discharge Instructions for Patients Receiving Chemotherapy  Today you received the following chemotherapy agents: Herceptin, Perjeta, Taxotere, Carboplatin.  To help prevent nausea and vomiting after your treatment, we encourage you to take your nausea medication as prescribed. If you develop nausea and vomiting that is not controlled by your nausea medication, call the clinic.   BELOW ARE SYMPTOMS THAT SHOULD BE REPORTED IMMEDIATELY:  *FEVER GREATER THAN 100.5 F  *CHILLS WITH OR WITHOUT FEVER  NAUSEA AND VOMITING THAT IS NOT CONTROLLED WITH YOUR NAUSEA MEDICATION  *UNUSUAL SHORTNESS OF BREATH  *UNUSUAL BRUISING OR BLEEDING  TENDERNESS IN MOUTH AND THROAT WITH OR WITHOUT PRESENCE OF ULCERS  *URINARY PROBLEMS  *BOWEL PROBLEMS  UNUSUAL RASH Items with * indicate a potential emergency and should be followed up as soon as possible.  Feel free to call the clinic should you have any questions or concerns. The clinic phone number is [phone].  Please show the CHEMO ALERT CARD at check-in to the Emergency Department and triage nurse.   

## 2017-10-16 ENCOUNTER — Ambulatory Visit (HOSPITAL_COMMUNITY)
Admission: RE | Admit: 2017-10-16 | Discharge: 2017-10-16 | Disposition: A | Discharge status: Home / Self Care | Payer: BLUE CROSS/BLUE SHIELD | Source: Ambulatory Visit | Attending: Hematology and Oncology | Admitting: Hematology and Oncology

## 2017-10-16 ENCOUNTER — Inpatient Hospital Stay: Payer: BLUE CROSS/BLUE SHIELD

## 2017-10-16 VITALS — BP 134/74 | HR 76 | Temp 97.9°F | Resp 18

## 2017-10-16 DIAGNOSIS — E669 Obesity, unspecified: Secondary | ICD-10-CM | POA: Insufficient documentation

## 2017-10-16 DIAGNOSIS — C50411 Malignant neoplasm of upper-outer quadrant of right female breast: Secondary | ICD-10-CM | POA: Diagnosis present

## 2017-10-16 DIAGNOSIS — Z170 Estrogen receptor positive status [ER+]: Secondary | ICD-10-CM | POA: Diagnosis present

## 2017-10-16 DIAGNOSIS — I447 Left bundle-branch block, unspecified: Secondary | ICD-10-CM | POA: Insufficient documentation

## 2017-10-16 DIAGNOSIS — R609 Edema, unspecified: Secondary | ICD-10-CM | POA: Insufficient documentation

## 2017-10-16 DIAGNOSIS — Z5181 Encounter for therapeutic drug level monitoring: Secondary | ICD-10-CM | POA: Insufficient documentation

## 2017-10-16 DIAGNOSIS — I209 Angina pectoris, unspecified: Secondary | ICD-10-CM | POA: Insufficient documentation

## 2017-10-16 DIAGNOSIS — Z79899 Other long term (current) drug therapy: Secondary | ICD-10-CM | POA: Diagnosis not present

## 2017-10-16 DIAGNOSIS — Z171 Estrogen receptor negative status [ER-]: Secondary | ICD-10-CM

## 2017-10-16 MED ORDER — PEGFILGRASTIM INJECTION 6 MG/0.6ML ~~LOC~~
6.0000 mg | PREFILLED_SYRINGE | Freq: Once | SUBCUTANEOUS | Status: AC
  Administered 2017-10-16: 6 mg via SUBCUTANEOUS

## 2017-10-16 MED ORDER — PEGFILGRASTIM INJECTION 6 MG/0.6ML ~~LOC~~
PREFILLED_SYRINGE | SUBCUTANEOUS | Status: AC
  Filled 2017-10-16: qty 0.6

## 2017-10-16 NOTE — Progress Notes (Signed)
  Echocardiogram 2D Echocardiogram has been performed.  Rebecca [name] 10/16/2017, 10:52 AM

## 2017-10-20 ENCOUNTER — Other Ambulatory Visit: Payer: Self-pay | Admitting: *Deleted

## 2017-10-20 DIAGNOSIS — Z171 Estrogen receptor negative status [ER-]: Principal | ICD-10-CM

## 2017-10-20 DIAGNOSIS — C50411 Malignant neoplasm of upper-outer quadrant of right female breast: Secondary | ICD-10-CM

## 2017-10-23 ENCOUNTER — Other Ambulatory Visit: Payer: Self-pay | Admitting: *Deleted

## 2017-10-23 DIAGNOSIS — Z171 Estrogen receptor negative status [ER-]: Principal | ICD-10-CM

## 2017-10-23 DIAGNOSIS — C50411 Malignant neoplasm of upper-outer quadrant of right female breast: Secondary | ICD-10-CM

## 2017-10-23 MED ORDER — ONDANSETRON HCL 8 MG PO TABS: 8.0000 mg | ORAL_TABLET | Freq: Two times a day (BID) | ORAL | 3 refills | Status: DC | PRN

## 2017-10-28 ENCOUNTER — Ambulatory Visit (HOSPITAL_COMMUNITY)
Admission: RE | Admit: 2017-10-28 | Discharge: 2017-10-28 | Disposition: A | Discharge status: Home / Self Care | Payer: BLUE CROSS/BLUE SHIELD | Source: Ambulatory Visit | Attending: Internal Medicine | Admitting: Internal Medicine

## 2017-10-28 ENCOUNTER — Encounter (HOSPITAL_COMMUNITY): Payer: Self-pay | Admitting: Internal Medicine

## 2017-10-28 VITALS — BP 137/87 | HR 84 | Wt 226.0 lb

## 2017-10-28 DIAGNOSIS — Z87891 Personal history of nicotine dependence: Secondary | ICD-10-CM | POA: Insufficient documentation

## 2017-10-28 DIAGNOSIS — Z7984 Long term (current) use of oral hypoglycemic drugs: Secondary | ICD-10-CM | POA: Diagnosis not present

## 2017-10-28 DIAGNOSIS — K449 Diaphragmatic hernia without obstruction or gangrene: Secondary | ICD-10-CM | POA: Insufficient documentation

## 2017-10-28 DIAGNOSIS — K219 Gastro-esophageal reflux disease without esophagitis: Secondary | ICD-10-CM | POA: Diagnosis not present

## 2017-10-28 DIAGNOSIS — C50411 Malignant neoplasm of upper-outer quadrant of right female breast: Secondary | ICD-10-CM

## 2017-10-28 DIAGNOSIS — Z171 Estrogen receptor negative status [ER-]: Secondary | ICD-10-CM

## 2017-10-28 DIAGNOSIS — E039 Hypothyroidism, unspecified: Secondary | ICD-10-CM | POA: Insufficient documentation

## 2017-10-28 DIAGNOSIS — I428 Other cardiomyopathies: Secondary | ICD-10-CM | POA: Diagnosis not present

## 2017-10-28 DIAGNOSIS — Z833 Family history of diabetes mellitus: Secondary | ICD-10-CM | POA: Diagnosis not present

## 2017-10-28 DIAGNOSIS — D509 Iron deficiency anemia, unspecified: Secondary | ICD-10-CM | POA: Diagnosis not present

## 2017-10-28 DIAGNOSIS — Z79890 Hormone replacement therapy (postmenopausal): Secondary | ICD-10-CM | POA: Insufficient documentation

## 2017-10-28 DIAGNOSIS — F419 Anxiety disorder, unspecified: Secondary | ICD-10-CM | POA: Diagnosis not present

## 2017-10-28 DIAGNOSIS — E119 Type 2 diabetes mellitus without complications: Secondary | ICD-10-CM | POA: Insufficient documentation

## 2017-10-28 DIAGNOSIS — Z79899 Other long term (current) drug therapy: Secondary | ICD-10-CM | POA: Insufficient documentation

## 2017-10-28 DIAGNOSIS — I447 Left bundle-branch block, unspecified: Secondary | ICD-10-CM

## 2017-10-28 MED ORDER — CARVEDILOL 3.125 MG PO TABS: 3.1250 mg | ORAL_TABLET | Freq: Two times a day (BID) | ORAL | 3 refills | Status: DC

## 2017-10-28 MED ORDER — LOSARTAN POTASSIUM 25 MG PO TABS: 25.0000 mg | ORAL_TABLET | Freq: Every day | ORAL | 3 refills | Status: DC

## 2017-10-28 NOTE — Patient Instructions (Addendum)
Start Carvedilol 3.125 mg (1 tab), twice a day   Start Losartan 25 mg (1 tab) daily  Your physician has requested that you have a cardiac MRI. Cardiac MRI uses a computer to create images of your heart as its beating, producing both still and moving pictures of your heart and major blood vessels. For further information please visit [email or web address] Please follow the instruction sheet given to you today for more information.   Your physician recommends that you schedule a follow-up appointment in: 3 months with Dr. Haroldine Laws and an echocardiogram

## 2017-10-28 NOTE — Progress Notes (Signed)
Cardio-Oncology Clinic Consult Note   Referring Physician: Dr Lindi Adie Primary Care: Dr Shelia Media Primary Cardiologist: Dr Haroldine Laws  HPI:  Rebecca [name] is a 47 y.o. female with past medical history of LBBB, DM, anxiety, GERD, diverticulosis, hypothyroidism, iron deficiency anemia, and right breast cancer who is referred to Dr. Lindi Adie to establish in the cardio-oncology clinic for monitoring of cardio-toxicty while undergoing chemotherapy.  Breast cancer history:  Malignant neoplasm of upper-outer quadrant of right breast in female, estrogen receptor negative ([place])   07/09/2017 Initial Diagnosis    Patient palpated a right breast mass which was evaluated by mammogram and ultrasound and a breast MRI which revealed 4.5 x 4 cm necrotic tumor with suspicious multiple right axillary lymph nodes; biopsy revealed IDC grade 3 ER 0%, PR 0%, HER-2 positive ratio 3.08, Ki-67 70%, T2NX stage IIa/IIb      07/18/2017 -  Neo-Adjuvant Chemotherapy    TCH Perjeta x6 cycles followed by Herceptin Perjeta maintenance      08/15/2017 Genetic Testing    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  The Multi-Gene Panel offered by [facility] includes sequencing and/or deletion duplication testing of the following 83 genes: ALK, APC, ATM, AXIN2,BAP1,  BARD1, BLM, BMPR1A, BRCA1, BRCA2, BRIP1, CASR, CDC73, CDH1, CDK4, CDKN1B, CDKN1C, CDKN2A (p14ARF), CDKN2A (p16INK4a), CEBPA, CHEK2, CTNNA1, DICER1, DIS3L2, EGFR (c.2369C>T, p.Thr790Met variant only), EPCAM (Deletion/duplication testing only), FH, FLCN, GATA2, GPC3, GREM1 (Promoter region deletion/duplication testing only), HOXB13 (c.251G>A, p.Gly84Glu), HRAS, KIT, MAX, MEN1, MET, MITF (c.952G>A, p.Glu318Lys variant only), MLH1, MSH2, MSH3, MSH6, MUTYH, NBN, NF1, NF2, NTHL1, PALB2, PDGFRA, PHOX2B, PMS2, POLD1, POLE, POT1, PRKAR1A, PTCH1, PTEN, RAD50, RAD51C, RAD51D, RB1, RECQL4, RET, RUNX1, SDHAF2, SDHA (sequence changes only), SDHB,  SDHC, SDHD, SMAD4, SMARCA4, SMARCB1, SMARCE1, STK11, SUFU, TERT, TERT, TMEM127, TP53, TSC1, TSC2, VHL, WRN and WT1.  The report date is August 15, 2017.      Treatment Plan based on multidisciplinary tumor board: 1. Neoadjuvant chemotherapy with TCH Perjeta 6 cycles followed by Herceptinand Perjetamaintenance for 1 year 2. Followed bybilateral mastectomies with targeted node dissection on the right 3. Followed by adjuvant radiation therapy  Echo 07/17/17: EF 50-55%, borderline diffuse HK, septal motion paradox, trivial AI, LA mildly dilated Echo 10/16/17: EF 45-50%, grade 1 DD, septal motion dyssynergy, trivial MR  Here today with her husband. Overall she has been feeling very anxious and feeling poorly with hot flashes and difficulty tolerating chemo. She is understandably very concerned about her heart. She has a family history of CAD but denies any known personal h/o CAD. Has been following with Dr. Einar Gip for LBBB.  She started treatment for her breast CA in March with [facility] every 3 weeks. Has more treatment next Tuesday, then will have Herceptin maintenance. Denies SOB, orthopnea, or edema. 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 Not very active. Appetite is okay except during week of chemo. No dizziness or CP. No palpitations. No syncope or presyncope.   Family HX:   MGF 67s died MI M 37 nash F 60s lymphoma MGM 58 CABG died 24s CVA  Review of Systems: [y] = yes, [ ]  = no   General: Weight gain [ ] ; Weight loss [ ] ; Anorexia [ ] ; Fatigue [ ] ; Fever [ ] ; Chills [ ] ; Weakness [ ]   Cardiac: Chest pain/pressure [ ] ; Resting SOB [ ] ; Exertional SOB [ ] ; Orthopnea [ ] ; Pedal Edema [ ] ;  Palpitations [ ] ; Syncope [ ] ; Presyncope [ ] ; Paroxysmal nocturnal dyspnea[ ]   Pulmonary: Cough [ ] ; Wheezing[ ] ; Hemoptysis[ ] ; Sputum [ ] ; Snoring [ ]   GI: Vomiting[ ] ; Dysphagia[ ] ; Melena[ ] ; Hematochezia [ ] ;  Heartburn[ ] ; Abdominal pain [ ] ; Constipation [ ] ; Diarrhea [ ] ; BRBPR [ ]   GU: Hematuria[ ] ; Dysuria [ ] ; Nocturia[ ]   Vascular: Pain in legs with walking [ ] ; Pain in feet with lying flat [ ] ; Non-healing sores [ ] ; Stroke [ ] ; TIA [ ] ; Slurred speech [ ] ;  Neuro: Headaches[ ] ; Vertigo[ ] ; Seizures[ ] ; Paresthesias[ ] ;Blurred vision [ ] ; Diplopia [ ] ; Vision changes [ ]   Ortho/Skin: Arthritis [ y]; Joint pain [y]; Muscle pain [ ] ; Joint swelling [ ] ; Back Pain [ ] ; Rash [ ]   Psych: Depression[ ] ; Anxiety[y ]  Heme: Bleeding problems [ ] ; Clotting disorders [ ] ; Anemia [ ]   Endocrine: Diabetes Blue.Reese ]; Thyroid dysfunction[ ]    Past Medical History:  Diagnosis Date  . Anxiety   . Breast cancer ([place])   . Diabetes mellitus without complication ([place])   . Diverticulitis   . GERD (gastroesophageal reflux disease)   . Hiatal hernia   . Hypothyroidism   . LBBB (left bundle branch block)     Current Outpatient Medications  Medication Sig Dispense Refill  . dexamethasone (DECADRON) 4 MG tablet Take 1 tablet (4 mg total) by mouth daily. Take 1 tablet day before chemo and 1 tablet day after with food 12 tablet 0  . esomeprazole (NEXIUM) 40 MG capsule Nexium 40 mg capsule,delayed release  Take 1 capsule every day by oral route.    Marland Kitchen levothyroxine (SYNTHROID, LEVOTHROID) 137 MCG tablet Take 137 mcg by mouth daily before breakfast.    . lidocaine-prilocaine (EMLA) cream Apply to affected area once 30 g 3  . LORazepam (ATIVAN) 0.5 MG tablet Take 1 tablet (0.5 mg total) by mouth at bedtime as needed for sleep. 30 tablet 0  . LORazepam (ATIVAN) 1 MG tablet Take 1 tablet (1 mg total) by mouth every 6 (six) hours as needed for anxiety. 12 tablet 0  . magic mouthwash w/lidocaine SOLN Take 5 mLs by mouth 3 (three) times daily as needed for mouth pain. 200 mL 0  . metFORMIN (GLUCOPHAGE) 500 MG tablet Take by mouth daily with breakfast.    . ondansetron (ZOFRAN) 8 MG tablet Take 1 tablet (8 mg total) by  mouth 2 (two) times daily as needed (Nausea or vomiting). Begin 4 days after chemotherapy. 60 tablet 3  . prochlorperazine (COMPAZINE) 10 MG tablet Take 1 tablet (10 mg total) by mouth every 6 (six) hours as needed (Nausea or vomiting). 30 tablet 1  . propranolol (INNOPRAN XL) 120 MG 24 hr capsule Take 120 mg by mouth at bedtime.    . triamterene-hydrochlorothiazide (MAXZIDE) 75-50 MG tablet One po daily prn lower extremity edema 30 tablet 2  . HYDROcodone-acetaminophen (NORCO) 10-325 MG tablet Take 1 tablet by mouth every 6 (six) hours as needed. (Patient not taking: Reported on 10/28/2017) 10 tablet 0   No current facility-administered medications for this encounter.    Facility-Administered Medications Ordered in Other Encounters  Medication Dose Route Frequency Provider Last Rate Last Dose  . LORazepam (ATIVAN) injection 1 mg  1 mg Intravenous Once Harle Stanford., PA-C        No Known Allergies    Social History   Socioeconomic History  . Marital status: Married    Spouse name:  Not on file  . Number of children: 2  . Years of education: Not on file  . Highest education level: Not on file  Occupational History  . Occupation: Neurosurgeon  . Financial resource strain: Not on file  . Food insecurity:    Worry: Not on file    Inability: Not on file  . Transportation needs:    Medical: Not on file    Non-medical: Not on file  Tobacco Use  . Smoking status: Former Smoker    Packs/day: 0.25    Years: 6.00    Pack years: 1.50    Types: Cigarettes    Last attempt to quit: 03/29/2005    Years since quitting: 12.5  . Smokeless tobacco: Never Used  Substance and Sexual Activity  . Alcohol use: Yes    Comment: socially  . Drug use: No  . Sexual activity: Yes    Birth control/protection: Surgical  Lifestyle  . Physical activity:    Days per week: Not on file    Minutes per session: Not on file  . Stress: Not on file  Relationships  . Social connections:     Talks on phone: Not on file    Gets together: Not on file    Attends religious service: Not on file    Active member of club or organization: Not on file    Attends meetings of clubs or organizations: Not on file    Relationship status: Not on file  . Intimate partner violence:    Fear of current or ex partner: Not on file    Emotionally abused: Not on file    Physically abused: Not on file    Forced sexual activity: Not on file  Other Topics Concern  . Not on file  Social History Narrative   ** Merged History Encounter **          Family History  Problem Relation Age of Onset  . Lymphoma Father   . Cancer Father        lymphoma  . Diabetes Brother   . Diabetes Mother   . Liver disease Mother        NASH, d. 15  . Stomach cancer Neg Hx   . Colon cancer Neg Hx     Vitals:   10/28/17 1157  BP: 137/87  Pulse: 84  SpO2: 100%  Weight: 226 lb (102.5 kg)   Wt Readings from Last 3 Encounters:  10/28/17 226 lb (102.5 kg)  10/14/17 228 lb 14.4 oz (103.8 kg)  09/23/17 227 lb 8 oz (103.2 kg)   PHYSICAL EXAM: General:  Appears stressed No respiratory difficulty HEENT: normal x for alopecia Neck: supple. no JVD. Carotids 2+ bilat; no bruits. No lymphadenopathy or thyromegaly appreciated. Cor: PMI nondisplaced. Regular rate & rhythm. No rubs, gallops or murmurs. R port Lungs: clear Abdomen: obese soft, nontender, nondistended. No hepatosplenomegaly. No bruits or masses. Good bowel sounds. Extremities: no cyanosis, clubbing, rash, edema Neuro: alert & oriented x 3, cranial nerves grossly intact. moves all 4 extremities w/o difficulty. Affect pleasant.   ASSESSMENT & PLAN:  1. Malignant neoplasm of upper-outer quadrant of right breast in female, estrogen receptor negative ([place]) Neoadjuvant chemotherapy with [place] Perjeta 6 cycles followed by Herceptinand Perjetamaintenance for 1 year - Echo 07/17/17: EF 50-55%, borderline diffuse HK, septal motion paradox, trivial AI, LA  mildly dilated - Echo 10/16/17: EF 45-50%, grade 1 DD, septal motion dyssynergy, trivial MR   2. LBBB - since EKG  2013  3. Iron deficiency anemia - Received IV iron 5/6, 5/28  We had a long talk about her situation and I reviewed echo images personally with her and her husband. I explained incidence of Herceptin cardiotoxicity (10%) and role of Cardio-oncology clinic at length.   Her EF is slightly decreased but this is due to septal dyssynergy in setting of LBBB and is completely unchanged from baseline. I reassured her that she does not have any cardiotoxicity from Herceptin at this point. I also emphasized the importance of sticking with her Herceptin therapy to help cure her breast CA and that if she were to get some cardiotoxicity this would be completely reversible with a scheduled treatment interruption.  We reviewed the pathophysiology and causes of LBBB and the fact that this can cause progressive LV dysfunction but that hers was quite mild. We also discussed the role of a BiV pacemaker if her EF would drop to 35 % or less in future.   Overall I reassured her that her heart was very stable and we will watch her closely though her HErceptin therapy.   For now will: 1) Proceed with cMRI to look for scar or infiltrative disease as etiology for LBBB 2) Stare carvedilol 3.125 bid and losartan 25 daily for cardio-protection 3) Arrange for repeat echo q3 months while getting Herceptin.   Glori Bickers, MD  8:31 PM

## 2017-11-04 ENCOUNTER — Inpatient Hospital Stay: Payer: BLUE CROSS/BLUE SHIELD | Attending: Hematology and Oncology

## 2017-11-04 ENCOUNTER — Inpatient Hospital Stay: Payer: BLUE CROSS/BLUE SHIELD

## 2017-11-04 ENCOUNTER — Encounter: Payer: Self-pay | Admitting: *Deleted

## 2017-11-04 ENCOUNTER — Inpatient Hospital Stay (HOSPITAL_BASED_OUTPATIENT_CLINIC_OR_DEPARTMENT_OTHER): Payer: BLUE CROSS/BLUE SHIELD | Admitting: Hematology and Oncology

## 2017-11-04 DIAGNOSIS — C50411 Malignant neoplasm of upper-outer quadrant of right female breast: Secondary | ICD-10-CM

## 2017-11-04 DIAGNOSIS — Z79899 Other long term (current) drug therapy: Secondary | ICD-10-CM | POA: Diagnosis not present

## 2017-11-04 DIAGNOSIS — Z171 Estrogen receptor negative status [ER-]: Secondary | ICD-10-CM | POA: Insufficient documentation

## 2017-11-04 DIAGNOSIS — N920 Excessive and frequent menstruation with regular cycle: Secondary | ICD-10-CM | POA: Insufficient documentation

## 2017-11-04 DIAGNOSIS — Z5111 Encounter for antineoplastic chemotherapy: Secondary | ICD-10-CM | POA: Diagnosis not present

## 2017-11-04 DIAGNOSIS — D500 Iron deficiency anemia secondary to blood loss (chronic): Secondary | ICD-10-CM

## 2017-11-04 DIAGNOSIS — R5383 Other fatigue: Secondary | ICD-10-CM | POA: Diagnosis not present

## 2017-11-04 DIAGNOSIS — Z95828 Presence of other vascular implants and grafts: Secondary | ICD-10-CM

## 2017-11-04 LAB — CMP (CANCER CENTER ONLY)
ALT: 19 U/L (ref 0–44)
ANION GAP: 9 (ref 5–15)
AST: 15 U/L (ref 15–41)
Albumin: 4.3 g/dL (ref 3.5–5.0)
Alkaline Phosphatase: 64 U/L (ref 38–126)
BILIRUBIN TOTAL: 0.3 mg/dL (ref 0.3–1.2)
BUN: 11 mg/dL (ref 6–20)
CO2: 27 mmol/L (ref 22–32)
Calcium: 9.6 mg/dL (ref 8.9–10.3)
Chloride: 104 mmol/L (ref 98–111)
Creatinine: 0.9 mg/dL (ref 0.44–1.00)
GFR, Est AFR Am: >60 mL/min (ref >60)
Glucose, Bld: 110 mg/dL — ABNORMAL HIGH (ref 70–99)
POTASSIUM: 4 mmol/L (ref 3.5–5.1)
Sodium: 140 mmol/L (ref 135–145)
Total Protein: 7.1 g/dL (ref 6.5–8.1)

## 2017-11-04 LAB — CBC WITH DIFFERENTIAL (CANCER CENTER ONLY)
BASOS ABS: 0 10*3/uL (ref 0.0–0.1)
BASOS PCT: 0 %
Eosinophils Absolute: 0 10*3/uL (ref 0.0–0.5)
Eosinophils Relative: 0 %
HEMATOCRIT: 34.8 % (ref 34.8–46.6)
HEMOGLOBIN: 11.4 g/dL — AB (ref 11.6–15.9)
Lymphocytes Relative: 18 %
Lymphs Abs: 1.4 10*3/uL (ref 0.9–3.3)
MCH: 29.4 pg (ref 25.1–34.0)
MCHC: 32.8 g/dL (ref 31.5–36.0)
MCV: 89.6 fL (ref 79.5–101.0)
Monocytes Absolute: 0.7 10*3/uL (ref 0.1–0.9)
Monocytes Relative: 9 %
NEUTROS ABS: 5.3 10*3/uL (ref 1.5–6.5)
NEUTROS PCT: 73 %
Platelet Count: 168 10*3/uL (ref 145–400)
RBC: 3.88 MIL/uL (ref 3.70–5.45)
RDW: 24.4 % — AB (ref 11.2–14.5)
WBC Count: 7.4 10*3/uL (ref 3.9–10.3)

## 2017-11-04 MED ORDER — DIPHENHYDRAMINE HCL 25 MG PO CAPS
50.0000 mg | ORAL_CAPSULE | Freq: Once | ORAL | Status: AC
  Administered 2017-11-04: 50 mg via ORAL

## 2017-11-04 MED ORDER — SODIUM CHLORIDE 0.9 % IV SOLN
Freq: Once | INTRAVENOUS | Status: AC
  Administered 2017-11-04: 11:00:00 via INTRAVENOUS
  Filled 2017-11-04: qty 5

## 2017-11-04 MED ORDER — HEPARIN SOD (PORK) LOCK FLUSH 100 UNIT/ML IV SOLN
500.0000 [IU] | Freq: Once | INTRAVENOUS | Status: AC | PRN
  Administered 2017-11-04: 500 [IU]
  Filled 2017-11-04: qty 5

## 2017-11-04 MED ORDER — SODIUM CHLORIDE 0.9 % IV SOLN
60.0000 mg/m2 | Freq: Once | INTRAVENOUS | Status: AC
  Administered 2017-11-04: 130 mg via INTRAVENOUS
  Filled 2017-11-04: qty 13

## 2017-11-04 MED ORDER — SODIUM CHLORIDE 0.9 % IV SOLN
Freq: Once | INTRAVENOUS | Status: AC
  Administered 2017-11-04: 10:00:00 via INTRAVENOUS

## 2017-11-04 MED ORDER — PALONOSETRON HCL INJECTION 0.25 MG/5ML
INTRAVENOUS | Status: AC
  Filled 2017-11-04: qty 5

## 2017-11-04 MED ORDER — DIPHENHYDRAMINE HCL 25 MG PO CAPS
ORAL_CAPSULE | ORAL | Status: AC
  Filled 2017-11-04: qty 2

## 2017-11-04 MED ORDER — SODIUM CHLORIDE 0.9 % IV SOLN
700.0000 mg | Freq: Once | INTRAVENOUS | Status: AC
  Administered 2017-11-04: 700 mg via INTRAVENOUS
  Filled 2017-11-04: qty 70

## 2017-11-04 MED ORDER — ACETAMINOPHEN 325 MG PO TABS
ORAL_TABLET | ORAL | Status: AC
  Filled 2017-11-04: qty 2

## 2017-11-04 MED ORDER — PALONOSETRON HCL INJECTION 0.25 MG/5ML
0.2500 mg | Freq: Once | INTRAVENOUS | Status: AC
  Administered 2017-11-04: 0.25 mg via INTRAVENOUS

## 2017-11-04 MED ORDER — SODIUM CHLORIDE 0.9% FLUSH
10.0000 mL | INTRAVENOUS | Status: DC | PRN
  Administered 2017-11-04: 10 mL
  Filled 2017-11-04: qty 10

## 2017-11-04 MED ORDER — ACETAMINOPHEN 325 MG PO TABS
650.0000 mg | ORAL_TABLET | Freq: Once | ORAL | Status: AC
  Administered 2017-11-04: 650 mg via ORAL

## 2017-11-04 MED ORDER — SODIUM CHLORIDE 0.9 % IV SOLN
420.0000 mg | Freq: Once | INTRAVENOUS | Status: AC
  Administered 2017-11-04: 420 mg via INTRAVENOUS
  Filled 2017-11-04: qty 14

## 2017-11-04 MED ORDER — SODIUM CHLORIDE 0.9 % IV SOLN
600.0000 mg | Freq: Once | INTRAVENOUS | Status: AC
  Administered 2017-11-04: 600 mg via INTRAVENOUS
  Filled 2017-11-04: qty 28.57

## 2017-11-04 NOTE — Patient Instructions (Signed)
[facility] Discharge Instructions for Patients Receiving Chemotherapy  Today you received the following chemotherapy agents: Herceptin, Perjeta, Taxotere, Carboplatin.  To help prevent nausea and vomiting after your treatment, we encourage you to take your nausea medication as prescribed. If you develop nausea and vomiting that is not controlled by your nausea medication, call the clinic.   BELOW ARE SYMPTOMS THAT SHOULD BE REPORTED IMMEDIATELY:  *FEVER GREATER THAN 100.5 F  *CHILLS WITH OR WITHOUT FEVER  NAUSEA AND VOMITING THAT IS NOT CONTROLLED WITH YOUR NAUSEA MEDICATION  *UNUSUAL SHORTNESS OF BREATH  *UNUSUAL BRUISING OR BLEEDING  TENDERNESS IN MOUTH AND THROAT WITH OR WITHOUT PRESENCE OF ULCERS  *URINARY PROBLEMS  *BOWEL PROBLEMS  UNUSUAL RASH Items with * indicate a potential emergency and should be followed up as soon as possible.  Feel free to call the clinic should you have any questions or concerns. The clinic phone number is [phone].  Please show the CHEMO ALERT CARD at check-in to the Emergency Department and triage nurse.   

## 2017-11-04 NOTE — Progress Notes (Signed)
Patient Care Team: Deland Pretty, MD as PCP - General (Internal Medicine) Baxley, Cresenciano Lick, MD (Internal Medicine)  DIAGNOSIS:  Encounter Diagnosis  Name Primary?  . Malignant neoplasm of upper-outer quadrant of right breast in female, estrogen receptor negative ([place])     SUMMARY OF ONCOLOGIC HISTORY:   Malignant neoplasm of upper-outer quadrant of right breast in female, estrogen receptor negative ([place])   07/09/2017 Initial Diagnosis    Patient palpated a right breast mass which was evaluated by mammogram and ultrasound and a breast MRI which revealed 4.5 x 4 cm necrotic tumor with suspicious multiple right axillary lymph nodes; biopsy revealed IDC grade 3 ER 0%, PR 0%, HER-2 positive ratio 3.08, Ki-67 70%, T2NX stage IIa/IIb      07/18/2017 -  Neo-Adjuvant Chemotherapy    TCH Perjeta x6 cycles followed by Herceptin Perjeta maintenance      08/15/2017 Genetic Testing    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  The Multi-Gene Panel offered by [facility] includes sequencing and/or deletion duplication testing of the following 83 genes: ALK, APC, ATM, AXIN2,BAP1,  BARD1, BLM, BMPR1A, BRCA1, BRCA2, BRIP1, CASR, CDC73, CDH1, CDK4, CDKN1B, CDKN1C, CDKN2A (p14ARF), CDKN2A (p16INK4a), CEBPA, CHEK2, CTNNA1, DICER1, DIS3L2, EGFR (c.2369C>T, p.Thr790Met variant only), EPCAM (Deletion/duplication testing only), FH, FLCN, GATA2, GPC3, GREM1 (Promoter region deletion/duplication testing only), HOXB13 (c.251G>A, p.Gly84Glu), HRAS, KIT, MAX, MEN1, MET, MITF (c.952G>A, p.Glu318Lys variant only), MLH1, MSH2, MSH3, MSH6, MUTYH, NBN, NF1, NF2, NTHL1, PALB2, PDGFRA, PHOX2B, PMS2, POLD1, POLE, POT1, PRKAR1A, PTCH1, PTEN, RAD50, RAD51C, RAD51D, RB1, RECQL4, RET, RUNX1, SDHAF2, SDHA (sequence changes only), SDHB, SDHC, SDHD, SMAD4, SMARCA4, SMARCB1, SMARCE1, STK11, SUFU, TERT, TERT, TMEM127, TP53, TSC1, TSC2, VHL, WRN and WT1.  The report date is August 15, 2017.       CHIEF COMPLIANT:  Cycle 6 TCH Perjeta  INTERVAL HISTORY: Rebecca [name] is a 47 year old with above-mentioned history of right breast cancer currently neoadjuvant chemotherapy and today is cycle 6 of [place].  Patient is continuing to have side effects from chemotherapy these include fatigue, mucositis she is very happy to be at the end of her chemo journey.Marland Kitchen She will of course continue with Herceptin and Perjeta every 3 weeks.  REVIEW OF SYSTEMS:   Constitutional: Denies fevers, chills or abnormal weight loss Eyes: Denies blurriness of vision Ears, nose, mouth, throat, and face: Denies mucositis or sore throat Respiratory: Denies cough, dyspnea or wheezes Cardiovascular: Denies palpitation, chest discomfort Gastrointestinal:  Denies nausea, heartburn or change in bowel habits Skin: Denies abnormal skin rashes Lymphatics: Denies new lymphadenopathy or easy bruising Neurological:Denies numbness, tingling or new weaknesses Behavioral/Psych: Emotional issues and stress and anxiety from her family members Extremities: No lower extremity edema  All other systems were reviewed with the patient and are negative.  I have reviewed the past medical history, past surgical history, social history and family history with the patient and they are unchanged from previous note.  ALLERGIES:  has No Known Allergies.  MEDICATIONS:  Current Outpatient Medications  Medication Sig Dispense Refill  . carvedilol (COREG) 3.125 MG tablet Take 1 tablet (3.125 mg total) by mouth 2 (two) times daily. 60 tablet 3  . dexamethasone (DECADRON) 4 MG tablet Take 1 tablet (4 mg total) by mouth daily. Take 1 tablet day before chemo and 1 tablet day after with food 12 tablet 0  . esomeprazole (NEXIUM) 40 MG capsule Nexium 40 mg capsule,delayed release  Take 1 capsule every day by oral route.    Marland Kitchen  HYDROcodone-acetaminophen (NORCO) 10-325 MG tablet Take 1 tablet by mouth every 6 (six) hours as needed. (Patient not taking: Reported on  10/28/2017) 10 tablet 0  . levothyroxine (SYNTHROID, LEVOTHROID) 137 MCG tablet Take 137 mcg by mouth daily before breakfast.    . lidocaine-prilocaine (EMLA) cream Apply to affected area once 30 g 3  . LORazepam (ATIVAN) 0.5 MG tablet Take 1 tablet (0.5 mg total) by mouth at bedtime as needed for sleep. 30 tablet 0  . LORazepam (ATIVAN) 1 MG tablet Take 1 tablet (1 mg total) by mouth every 6 (six) hours as needed for anxiety. 12 tablet 0  . losartan (COZAAR) 25 MG tablet Take 1 tablet (25 mg total) by mouth daily. 30 tablet 3  . magic mouthwash w/lidocaine SOLN Take 5 mLs by mouth 3 (three) times daily as needed for mouth pain. 200 mL 0  . metFORMIN (GLUCOPHAGE) 500 MG tablet Take by mouth daily with breakfast.    . ondansetron (ZOFRAN) 8 MG tablet Take 1 tablet (8 mg total) by mouth 2 (two) times daily as needed (Nausea or vomiting). Begin 4 days after chemotherapy. 60 tablet 3  . prochlorperazine (COMPAZINE) 10 MG tablet Take 1 tablet (10 mg total) by mouth every 6 (six) hours as needed (Nausea or vomiting). 30 tablet 1  . propranolol (INNOPRAN XL) 120 MG 24 hr capsule Take 120 mg by mouth at bedtime.    . triamterene-hydrochlorothiazide (MAXZIDE) 75-50 MG tablet One po daily prn lower extremity edema 30 tablet 2   No current facility-administered medications for this visit.    Facility-Administered Medications Ordered in Other Visits  Medication Dose Route Frequency Provider Last Rate Last Dose  . LORazepam (ATIVAN) injection 1 mg  1 mg Intravenous Once Harle Stanford., PA-C        PHYSICAL EXAMINATION: ECOG PERFORMANCE STATUS: 1 - Symptomatic but completely ambulatory  Vitals:   11/04/17 0917  BP: 115/82  Pulse: 77  Resp: 17  Temp: 98.4 F (36.9 C)  SpO2: 97%   Filed Weights   11/04/17 0917  Weight: 224 lb 14.4 oz (102 kg)    GENERAL:alert, no distress and comfortable SKIN: skin color, texture, turgor are normal, no rashes or significant lesions EYES: normal, Conjunctiva are  pink and non-injected, sclera clear OROPHARYNX:no exudate, no erythema and lips, buccal mucosa, and tongue normal  NECK: supple, thyroid normal size, non-tender, without nodularity LYMPH:  no palpable lymphadenopathy in the cervical, axillary or inguinal LUNGS: clear to auscultation and percussion with normal breathing effort HEART: regular rate & rhythm and no murmurs and no lower extremity edema ABDOMEN:abdomen soft, non-tender and normal bowel sounds MUSCULOSKELETAL:no cyanosis of digits and no clubbing  NEURO: alert & oriented x 3 with fluent speech, no focal motor/sensory deficits EXTREMITIES: No lower extremity edema   LABORATORY DATA:  I have reviewed the data as listed CMP Latest Ref Rng & Units 10/14/2017 09/23/2017 09/01/2017  Glucose 70 - 140 mg/dL 99 119(H) 168(H)  BUN 7 - 26 mg/dL 14 15 16   Creatinine 0.60 - 1.10 mg/dL 0.95 0.93 0.95  Sodium 136 - 145 mmol/L 142 139 139  Potassium 3.5 - 5.1 mmol/L 3.9 4.3 4.1  Chloride 98 - 109 mmol/L 105 102 106  CO2 22 - 29 mmol/L 28 25 24   Calcium 8.4 - 10.4 mg/dL 9.5 9.6 9.7  Total Protein 6.4 - 8.3 g/dL 6.7 7.8 7.5  Total Bilirubin 0.2 - 1.2 mg/dL 0.2 0.4 0.3  Alkaline Phos 40 - 150 U/L 55 61  73  AST 5 - 34 U/L 17 21 13   ALT 0 - 55 U/L 15 16 12     Lab Results  Component Value Date   WBC 7.4 11/04/2017   HGB 11.4 (L) 11/04/2017   HCT 34.8 11/04/2017   MCV 89.6 11/04/2017   PLT 168 11/04/2017   NEUTROABS 5.3 11/04/2017    ASSESSMENT & PLAN:  Malignant neoplasm of upper-outer quadrant of right breast in female, estrogen receptor negative ([place]) /13/2019:Patient palpated a right breast mass which was evaluated by mammogram and ultrasound and a breast MRI which revealed 4.5 x 4 cm necrotic tumor with suspicious multiple right axillary lymph nodes; biopsy revealed IDC grade 3 ER 0%, PR 0%, HER-2 positive ratio 3.08, Ki-67 70%, T2NX stage IIa/IIb  Treatment planbased on multidisciplinary tumor board: 1. Neoadjuvant chemotherapy  with TCH Perjeta 6 cycles followed by Herceptinand Perjetamaintenance for 1 year 2. Followed bybilateral mastectomies with targeted node dissection on the right 3. Followed by adjuvant radiation therapy ----------------------------------------------------------------------------- Current treatment: Cycle6day 1TCH Perjeta  Chemo toxicities: 1.Neulasta related bone pain: 2.throat pain: Due to mucositis sent Magic mouthwash 3.Leukopenia is expected: No change in the dosing 4.Microcytic anemia: Due to heavy menstrual bleeding. received IV iron therapy 5/6 and 09/23/2017 5.Fatigue: TSH and thyroid studiesdo not show evidence of hypothyroidism. this is chemotherapy-induced. 6. Chemo-induced peripheral neuropathy grade 1: decreased the dosage of Taxotere.  Continue with q 3 week Herceptin and Perjeta Breast MRI and Surgery follow up    No orders of the defined types were placed in this encounter.  The patient has a good understanding of the overall plan. she agrees with it. she will call with any problems that may develop before the next visit here.   Harriette Ohara, MD 11/04/17

## 2017-11-04 NOTE — Assessment & Plan Note (Signed)
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 Neoadjuvant chemotherapy with TCH Perjeta 6 cycles followed by Herceptinand Perjetamaintenance for 1 year 2. Followed bybilateral mastectomies with targeted node dissection on the right 3. Followed by adjuvant radiation therapy ----------------------------------------------------------------------------- Current treatment: Cycle6day 1TCH Perjeta  Chemo toxicities: 1.Neulasta related bone pain: 2.throat pain: Due to mucositis sent Magic mouthwash 3.Leukopenia is expected: No change in the dosing 4.Microcytic anemia: Due to heavy menstrual bleeding. received IV iron therapy 5/6 and 09/23/2017 5.Fatigue: TSH and thyroid studiesdo not show evidence of hypothyroidism. this is chemotherapy-induced. 6. Chemo-induced peripheral neuropathy grade 1: decreased the dosage of Taxotere.  Continue with q 3 week Herceptin and Perjeta Breast MRI and Surgery follow up

## 2017-11-05 ENCOUNTER — Telehealth: Payer: Self-pay | Admitting: Hematology and Oncology

## 2017-11-05 NOTE — Telephone Encounter (Signed)
Spoke to patient regarding upcoming appts

## 2017-11-06 ENCOUNTER — Ambulatory Visit: Payer: BLUE CROSS/BLUE SHIELD

## 2017-11-06 ENCOUNTER — Ambulatory Visit
Admission: RE | Admit: 2017-11-06 | Discharge: 2017-11-06 | Disposition: A | Discharge status: Home / Self Care | Payer: BLUE CROSS/BLUE SHIELD | Source: Ambulatory Visit | Attending: Hematology and Oncology | Admitting: Hematology and Oncology

## 2017-11-06 ENCOUNTER — Inpatient Hospital Stay: Payer: BLUE CROSS/BLUE SHIELD

## 2017-11-06 VITALS — BP 126/75 | HR 83 | Temp 98.2°F | Resp 18

## 2017-11-06 DIAGNOSIS — Z171 Estrogen receptor negative status [ER-]: Principal | ICD-10-CM

## 2017-11-06 DIAGNOSIS — C50411 Malignant neoplasm of upper-outer quadrant of right female breast: Secondary | ICD-10-CM

## 2017-11-06 MED ORDER — PEGFILGRASTIM INJECTION 6 MG/0.6ML ~~LOC~~
6.0000 mg | PREFILLED_SYRINGE | Freq: Once | SUBCUTANEOUS | Status: AC
  Administered 2017-11-06: 6 mg via SUBCUTANEOUS

## 2017-11-06 MED ORDER — PEGFILGRASTIM INJECTION 6 MG/0.6ML ~~LOC~~
PREFILLED_SYRINGE | SUBCUTANEOUS | Status: AC
  Filled 2017-11-06: qty 0.6

## 2017-11-06 MED ORDER — GADOBENATE DIMEGLUMINE 529 MG/ML IV SOLN
20.0000 mL | Freq: Once | INTRAVENOUS | Status: AC | PRN
  Administered 2017-11-06: 20 mL via INTRAVENOUS

## 2017-11-10 ENCOUNTER — Other Ambulatory Visit: Payer: Self-pay | Admitting: General Surgery

## 2017-11-10 DIAGNOSIS — C50411 Malignant neoplasm of upper-outer quadrant of right female breast: Secondary | ICD-10-CM

## 2017-11-10 DIAGNOSIS — Z170 Estrogen receptor positive status [ER+]: Principal | ICD-10-CM

## 2017-11-11 ENCOUNTER — Other Ambulatory Visit: Payer: Self-pay

## 2017-11-11 ENCOUNTER — Encounter (HOSPITAL_BASED_OUTPATIENT_CLINIC_OR_DEPARTMENT_OTHER): Payer: Self-pay | Admitting: *Deleted

## 2017-11-12 ENCOUNTER — Encounter (HOSPITAL_BASED_OUTPATIENT_CLINIC_OR_DEPARTMENT_OTHER)
Admission: RE | Admit: 2017-11-12 | Discharge: 2017-11-12 | Disposition: A | Discharge status: Home / Self Care | Payer: BLUE CROSS/BLUE SHIELD | Source: Ambulatory Visit | Attending: General Surgery | Admitting: General Surgery

## 2017-11-12 DIAGNOSIS — Z01810 Encounter for preprocedural cardiovascular examination: Secondary | ICD-10-CM | POA: Insufficient documentation

## 2017-11-12 DIAGNOSIS — E119 Type 2 diabetes mellitus without complications: Secondary | ICD-10-CM | POA: Insufficient documentation

## 2017-11-12 DIAGNOSIS — I447 Left bundle-branch block, unspecified: Secondary | ICD-10-CM | POA: Insufficient documentation

## 2017-11-12 NOTE — Progress Notes (Addendum)
EKG and Echo reviewed by Dr. Gifford Shave, will proceed with surgery as scheduled.  Ensure pre surgery drink given with instructions to complete by [place], surgical soap given with instructions, pt verbalized understanding.

## 2017-11-12 NOTE — H&P (Signed)
Subjective:     Patient ID: Rebecca [name] is a 47 y.o. female.  HPI  Patient of Drs. Donne Hazel and Brownington here for consultation breast reconstruction. Presented with palpable mass right breast. MMG noted extremely dense tissue; US showed at 8:30, 6 cmfn, mass measuring 5.1 x 3.0 x 3.1 cm. Additional mass at 8:30, 2 cmfn measuring 8 x 7 x 8 mm, favored to represent a cyst. US of the right axilla demonstrates multiple symmetric lymph nodes.  MRI showed 4.3 cm mass in the lateral aspect of the right breast with multiple concerning LN. Repeat US axilla with no abnormal nodes.   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 Completed neoadjuvant chemotherapy 7.9.19.  Final MRI with mass measuring 4.0 x 2.7 x 3.1 cm. On the previous exam, mass measured 4.4 x 4.4 x 2.6 cm by similar measurements. No abnormal appearing lymph nodes.  She is also anticipating cardiac MRI to assess for Herceptin toxicity in setting known left BBB.   She will receive adjuvant radiation. She plans on bilateral mastectomies.  Genetics with VUS MSH6. Staging scans negative.  Reports she was 36 C most of her life, current 38D after 60 lb weight gain over last 3 years, reports started with stressful period at work.  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   Objective:   Physical Exam  Cardiovascular: Normal rate, regular rhythm and normal heart sounds.   Pulmonary/Chest: Effort normal and breath sounds normal.  Lymphadenopathy:    She has no axillary adenopathy.   Palpable mass right breast 9 0 clock Grade 3 ptosis bilateral SN to nipple R 23 L 23 cm BW R 20 L 23 (CW 14 ) Nipple to IMF R 10 L 10     Assessment:     Right breast ca ER-, Her2+ Neoadjuvant chemotherapy    Plan:      Plan bilateral SRM with immediate expander placement.  Given degree of ptosis I recommend SRM pattern  mastectomy, reviewed anchor type scar.  Her history notes abdominoplasty in past-  this would preclude abdominal based reconstruction.  Reviewed use of acellular dermis in reconstruction, cadaveric source, incorporation over several weeks, risk that if has seroma or infection can act as additional nidus for infection if not incorporated.  Reviewed implant based reconstruction. Reviewed incisions, drains, OR length, hospital stay and recovery for each. Discussed process of expansion and implant based risks including rupture, MRI surveillance for silicone implants, infection requiring surgery or removal, contracture. Reviewed reconstruction will be be asensate and not stimulate. Reviewed risks mastectomy flap necrosis requiring additional surgery.  Discussed prepectoral vs sub pectoral reconstruction. Discussed with patient and benefit of this is no animation deformity, may be less pain. Risk may be more visible rippling over upper poles, greater need of ADM. Reviewed pre pectoral would require larger amount acellular dermis, more drains. Discussed any type reconstruction also risks long term displacement implant and visible rippling. If prepectoral counseled I would recommend she be comfortable with silicone implants as more options that have less rippling. She agrees to prepectoral placement.  Reviewed future surgery dependent on adjuvant treatments. This includes radiation- discussed this significantly increases risk reconstruction including wound healing problems capsular contracture. Options would be to delay reconstruction and pursue LD + TE on radiated side TE alone if no radiation. Alternative would be placement expanders at time of mastectomy- the benefit of this would be  to retain breast footprint. However this would then mean expanders would be in place for several months (approximately 6 months from end of radiation) before continuing reconstruction process. At that time could do implant  exchange alone, implant exchange with LD flap for radiated chest, or coversion to autologous.   Reviewed reconstrucionwill be asensate and not stimulate. Reviewed risks mastectomy flap necrosis requiring additional surgery.  Additional risks including but not limited to bleeding, hematoma, infection, damage to deeper structures, DVT/PE, cardiopulmonary complications reviewed.  Post mastectomy Rx given for [place] to [place].  Irene Limbo, MD [facility] [phone], pin [phone]

## 2017-11-18 ENCOUNTER — Inpatient Hospital Stay (HOSPITAL_COMMUNITY): Admission: RE | Admit: 2017-11-18 | Payer: BLUE CROSS/BLUE SHIELD | Source: Ambulatory Visit

## 2017-11-18 ENCOUNTER — Encounter (HOSPITAL_BASED_OUTPATIENT_CLINIC_OR_DEPARTMENT_OTHER): Payer: Self-pay | Admitting: Anesthesiology

## 2017-11-18 ENCOUNTER — Ambulatory Visit (HOSPITAL_BASED_OUTPATIENT_CLINIC_OR_DEPARTMENT_OTHER): Payer: BLUE CROSS/BLUE SHIELD | Admitting: Anesthesiology

## 2017-11-18 ENCOUNTER — Ambulatory Visit (HOSPITAL_BASED_OUTPATIENT_CLINIC_OR_DEPARTMENT_OTHER)
Admission: RE | Admit: 2017-11-18 | Discharge: 2017-11-19 | Disposition: A | Discharge status: Home / Self Care | Payer: BLUE CROSS/BLUE SHIELD | Source: Ambulatory Visit | Attending: General Surgery | Admitting: General Surgery

## 2017-11-18 ENCOUNTER — Encounter (HOSPITAL_COMMUNITY)
Admission: RE | Admit: 2017-11-18 | Discharge: 2017-11-18 | Disposition: A | Discharge status: Home / Self Care | Payer: BLUE CROSS/BLUE SHIELD | Source: Ambulatory Visit | Attending: General Surgery | Admitting: General Surgery

## 2017-11-18 ENCOUNTER — Encounter (HOSPITAL_BASED_OUTPATIENT_CLINIC_OR_DEPARTMENT_OTHER)
Admission: RE | Disposition: A | Discharge status: Home / Self Care | Payer: Self-pay | Source: Ambulatory Visit | Attending: General Surgery

## 2017-11-18 DIAGNOSIS — Z170 Estrogen receptor positive status [ER+]: Principal | ICD-10-CM

## 2017-11-18 DIAGNOSIS — C50411 Malignant neoplasm of upper-outer quadrant of right female breast: Secondary | ICD-10-CM

## 2017-11-18 DIAGNOSIS — I447 Left bundle-branch block, unspecified: Secondary | ICD-10-CM | POA: Insufficient documentation

## 2017-11-18 DIAGNOSIS — C50511 Malignant neoplasm of lower-outer quadrant of right female breast: Secondary | ICD-10-CM | POA: Diagnosis not present

## 2017-11-18 DIAGNOSIS — E119 Type 2 diabetes mellitus without complications: Secondary | ICD-10-CM | POA: Insufficient documentation

## 2017-11-18 DIAGNOSIS — Z79899 Other long term (current) drug therapy: Secondary | ICD-10-CM | POA: Diagnosis not present

## 2017-11-18 DIAGNOSIS — Z87891 Personal history of nicotine dependence: Secondary | ICD-10-CM | POA: Diagnosis not present

## 2017-11-18 DIAGNOSIS — Z7984 Long term (current) use of oral hypoglycemic drugs: Secondary | ICD-10-CM | POA: Insufficient documentation

## 2017-11-18 DIAGNOSIS — N62 Hypertrophy of breast: Secondary | ICD-10-CM | POA: Insufficient documentation

## 2017-11-18 DIAGNOSIS — Z171 Estrogen receptor negative status [ER-]: Secondary | ICD-10-CM | POA: Insufficient documentation

## 2017-11-18 DIAGNOSIS — C50911 Malignant neoplasm of unspecified site of right female breast: Secondary | ICD-10-CM | POA: Diagnosis present

## 2017-11-18 DIAGNOSIS — K449 Diaphragmatic hernia without obstruction or gangrene: Secondary | ICD-10-CM | POA: Diagnosis not present

## 2017-11-18 DIAGNOSIS — Z833 Family history of diabetes mellitus: Secondary | ICD-10-CM | POA: Diagnosis not present

## 2017-11-18 DIAGNOSIS — K219 Gastro-esophageal reflux disease without esophagitis: Secondary | ICD-10-CM | POA: Diagnosis not present

## 2017-11-18 DIAGNOSIS — Z9221 Personal history of antineoplastic chemotherapy: Secondary | ICD-10-CM | POA: Insufficient documentation

## 2017-11-18 HISTORY — PX: MASTECTOMY W/ SENTINEL NODE BIOPSY: SHX2001

## 2017-11-18 HISTORY — PX: BREAST RECONSTRUCTION WITH PLACEMENT OF TISSUE EXPANDER AND ALLODERM: SHX6805

## 2017-11-18 LAB — GLUCOSE, CAPILLARY
GLUCOSE-CAPILLARY: 130 mg/dL — AB (ref 70–99)
Glucose-Capillary: 94 mg/dL (ref 70–99)

## 2017-11-18 SURGERY — MASTECTOMY WITH SENTINEL LYMPH NODE BIOPSY
Anesthesia: General | Site: Breast | Laterality: Bilateral

## 2017-11-18 MED ORDER — SULFAMETHOXAZOLE-TRIMETHOPRIM 800-160 MG PO TABS: 1.0000 | ORAL_TABLET | Freq: Two times a day (BID) | ORAL | 0 refills | Status: DC

## 2017-11-18 MED ORDER — CEFAZOLIN SODIUM-DEXTROSE 2-4 GM/100ML-% IV SOLN
2.0000 g | INTRAVENOUS | Status: AC
  Administered 2017-11-18: 2 g via INTRAVENOUS

## 2017-11-18 MED ORDER — ACETAMINOPHEN 500 MG PO TABS
1000.0000 mg | ORAL_TABLET | Freq: Four times a day (QID) | ORAL | Status: DC
  Administered 2017-11-18: 1000 mg via ORAL
  Filled 2017-11-18: qty 2

## 2017-11-18 MED ORDER — LIDOCAINE HCL (CARDIAC) PF 100 MG/5ML IV SOSY
PREFILLED_SYRINGE | INTRAVENOUS | Status: DC | PRN
  Administered 2017-11-18: 75 mg via INTRAVENOUS

## 2017-11-18 MED ORDER — ALBUMIN HUMAN 5 % IV SOLN
INTRAVENOUS | Status: AC
  Filled 2017-11-18: qty 250

## 2017-11-18 MED ORDER — GABAPENTIN 300 MG PO CAPS
ORAL_CAPSULE | ORAL | Status: AC
  Filled 2017-11-18: qty 1

## 2017-11-18 MED ORDER — HYDROMORPHONE HCL 1 MG/ML IJ SOLN
0.2500 mg | INTRAMUSCULAR | Status: DC | PRN
  Administered 2017-11-18 (×2): 0.5 mg via INTRAVENOUS

## 2017-11-18 MED ORDER — HYDROMORPHONE HCL 1 MG/ML IJ SOLN
INTRAMUSCULAR | Status: AC
Start: 2017-11-18 — End: ?
  Filled 2017-11-18: qty 0.5

## 2017-11-18 MED ORDER — PHENYLEPHRINE 40 MCG/ML (10ML) SYRINGE FOR IV PUSH (FOR BLOOD PRESSURE SUPPORT)
PREFILLED_SYRINGE | INTRAVENOUS | Status: AC
  Filled 2017-11-18: qty 10

## 2017-11-18 MED ORDER — GABAPENTIN 100 MG PO CAPS
ORAL_CAPSULE | ORAL | Status: AC
  Filled 2017-11-18: qty 1

## 2017-11-18 MED ORDER — LABETALOL HCL 5 MG/ML IV SOLN
INTRAVENOUS | Status: DC | PRN
  Administered 2017-11-18: 5 mg via INTRAVENOUS

## 2017-11-18 MED ORDER — GLYCOPYRROLATE PF 0.2 MG/ML IJ SOSY
PREFILLED_SYRINGE | INTRAMUSCULAR | Status: AC
  Filled 2017-11-18: qty 1

## 2017-11-18 MED ORDER — ENSURE PRE-SURGERY PO LIQD: 296.0000 mL | Freq: Once | ORAL | Status: DC

## 2017-11-18 MED ORDER — MIDAZOLAM HCL 2 MG/2ML IJ SOLN
1.0000 mg | INTRAMUSCULAR | Status: DC | PRN
  Administered 2017-11-18: 0.5 mg via INTRAVENOUS
  Administered 2017-11-18: 2 mg via INTRAVENOUS

## 2017-11-18 MED ORDER — ONDANSETRON 4 MG PO TBDP
4.0000 mg | ORAL_TABLET | Freq: Four times a day (QID) | ORAL | Status: DC | PRN
  Filled 2017-11-18: qty 1

## 2017-11-18 MED ORDER — LIDOCAINE HCL (CARDIAC) PF 100 MG/5ML IV SOSY
PREFILLED_SYRINGE | INTRAVENOUS | Status: AC
  Filled 2017-11-18: qty 5

## 2017-11-18 MED ORDER — PANTOPRAZOLE SODIUM 40 MG PO TBEC: 40.0000 mg | DELAYED_RELEASE_TABLET | Freq: Every day | ORAL | Status: DC

## 2017-11-18 MED ORDER — METHYLENE BLUE 0.5 % INJ SOLN
INTRAVENOUS | Status: AC
  Filled 2017-11-18: qty 10

## 2017-11-18 MED ORDER — LABETALOL HCL 5 MG/ML IV SOLN
INTRAVENOUS | Status: AC
  Filled 2017-11-18: qty 4

## 2017-11-18 MED ORDER — METHOCARBAMOL 500 MG PO TABS
500.0000 mg | ORAL_TABLET | Freq: Four times a day (QID) | ORAL | Status: DC | PRN
  Administered 2017-11-18: 500 mg via ORAL
  Filled 2017-11-18: qty 1

## 2017-11-18 MED ORDER — CEFAZOLIN SODIUM-DEXTROSE 2-4 GM/100ML-% IV SOLN
INTRAVENOUS | Status: AC
  Filled 2017-11-18: qty 100

## 2017-11-18 MED ORDER — SIMETHICONE 80 MG PO CHEW: 40.0000 mg | CHEWABLE_TABLET | Freq: Four times a day (QID) | ORAL | Status: DC | PRN

## 2017-11-18 MED ORDER — HYDROMORPHONE HCL 1 MG/ML IJ SOLN
INTRAMUSCULAR | Status: AC
  Filled 2017-11-18: qty 0.5

## 2017-11-18 MED ORDER — ALBUMIN HUMAN 5 % IV SOLN
INTRAVENOUS | Status: DC | PRN
  Administered 2017-11-18: 12:00:00 via INTRAVENOUS

## 2017-11-18 MED ORDER — PHENYLEPHRINE 40 MCG/ML (10ML) SYRINGE FOR IV PUSH (FOR BLOOD PRESSURE SUPPORT)
PREFILLED_SYRINGE | INTRAVENOUS | Status: DC | PRN
  Administered 2017-11-18 (×4): 80 ug via INTRAVENOUS

## 2017-11-18 MED ORDER — GABAPENTIN 300 MG PO CAPS
300.0000 mg | ORAL_CAPSULE | ORAL | Status: AC
  Administered 2017-11-18: 300 mg via ORAL

## 2017-11-18 MED ORDER — SODIUM CHLORIDE 0.9 % IJ SOLN
INTRAMUSCULAR | Status: AC
  Filled 2017-11-18: qty 10

## 2017-11-18 MED ORDER — METHYLENE BLUE 0.5 % INJ SOLN: INTRAVENOUS | Status: DC | PRN

## 2017-11-18 MED ORDER — ONDANSETRON HCL 4 MG/2ML IJ SOLN
INTRAMUSCULAR | Status: AC
  Filled 2017-11-18: qty 2

## 2017-11-18 MED ORDER — ONDANSETRON HCL 4 MG/2ML IJ SOLN
INTRAMUSCULAR | Status: DC | PRN
  Administered 2017-11-18: 4 mg via INTRAVENOUS

## 2017-11-18 MED ORDER — METFORMIN HCL 500 MG PO TABS: 500.0000 mg | ORAL_TABLET | Freq: Every day | ORAL | Status: DC

## 2017-11-18 MED ORDER — ROCURONIUM BROMIDE 100 MG/10ML IV SOLN
INTRAVENOUS | Status: DC | PRN
  Administered 2017-11-18: 50 mg via INTRAVENOUS

## 2017-11-18 MED ORDER — METHOCARBAMOL 500 MG PO TABS: 500.0000 mg | ORAL_TABLET | Freq: Three times a day (TID) | ORAL | 0 refills | Status: DC | PRN

## 2017-11-18 MED ORDER — LORAZEPAM 0.5 MG PO TABS: 0.5000 mg | ORAL_TABLET | Freq: Every evening | ORAL | Status: DC | PRN

## 2017-11-18 MED ORDER — ROCURONIUM BROMIDE 10 MG/ML (PF) SYRINGE
PREFILLED_SYRINGE | INTRAVENOUS | Status: AC
  Filled 2017-11-18: qty 10

## 2017-11-18 MED ORDER — CEFAZOLIN SODIUM-DEXTROSE 2-4 GM/100ML-% IV SOLN
2.0000 g | Freq: Three times a day (TID) | INTRAVENOUS | Status: DC
  Administered 2017-11-18 – 2017-11-19 (×2): 2 g via INTRAVENOUS
  Filled 2017-11-18 (×2): qty 100

## 2017-11-18 MED ORDER — MIDAZOLAM HCL 2 MG/2ML IJ SOLN
INTRAMUSCULAR | Status: AC
  Filled 2017-11-18: qty 2

## 2017-11-18 MED ORDER — FENTANYL CITRATE (PF) 100 MCG/2ML IJ SOLN
50.0000 ug | INTRAMUSCULAR | Status: AC | PRN
  Administered 2017-11-18: 100 ug via INTRAVENOUS
  Administered 2017-11-18 (×2): 25 ug via INTRAVENOUS
  Administered 2017-11-18: 50 ug via INTRAVENOUS
  Administered 2017-11-18: 100 ug via INTRAVENOUS

## 2017-11-18 MED ORDER — FENTANYL CITRATE (PF) 100 MCG/2ML IJ SOLN
INTRAMUSCULAR | Status: AC
  Filled 2017-11-18: qty 2

## 2017-11-18 MED ORDER — CARVEDILOL 3.125 MG PO TABS
3.1250 mg | ORAL_TABLET | Freq: Two times a day (BID) | ORAL | Status: DC
  Administered 2017-11-18: 3.125 mg via ORAL

## 2017-11-18 MED ORDER — KETOROLAC TROMETHAMINE 15 MG/ML IJ SOLN
15.0000 mg | INTRAMUSCULAR | Status: AC
  Administered 2017-11-18: 15 mg via INTRAVENOUS

## 2017-11-18 MED ORDER — ACETAMINOPHEN 500 MG PO TABS
ORAL_TABLET | ORAL | Status: AC
  Filled 2017-11-18: qty 2

## 2017-11-18 MED ORDER — ONDANSETRON HCL 4 MG/2ML IJ SOLN
4.0000 mg | Freq: Four times a day (QID) | INTRAMUSCULAR | Status: DC | PRN
  Administered 2017-11-18: 4 mg via INTRAVENOUS

## 2017-11-18 MED ORDER — SODIUM CHLORIDE 0.9 % IV SOLN
INTRAVENOUS | Status: DC | PRN
  Administered 2017-11-18: 1000 mL

## 2017-11-18 MED ORDER — HYDROMORPHONE HCL 1 MG/ML IJ SOLN: 0.5000 mg | INTRAMUSCULAR | Status: DC | PRN

## 2017-11-18 MED ORDER — OXYCODONE HCL 5 MG PO TABS: 5.0000 mg | ORAL_TABLET | ORAL | 0 refills | Status: DC | PRN

## 2017-11-18 MED ORDER — CHLORHEXIDINE GLUCONATE CLOTH 2 % EX PADS: 6.0000 | MEDICATED_PAD | Freq: Once | CUTANEOUS | Status: DC

## 2017-11-18 MED ORDER — ENSURE PRE-SURGERY PO LIQD: 592.0000 mL | Freq: Once | ORAL | Status: DC

## 2017-11-18 MED ORDER — GABAPENTIN 100 MG PO CAPS
100.0000 mg | ORAL_CAPSULE | Freq: Every day | ORAL | Status: DC
  Administered 2017-11-18: 100 mg via ORAL

## 2017-11-18 MED ORDER — PROPOFOL 10 MG/ML IV BOLUS
INTRAVENOUS | Status: DC | PRN
  Administered 2017-11-18: 30 mg via INTRAVENOUS
  Administered 2017-11-18: 200 mg via INTRAVENOUS

## 2017-11-18 MED ORDER — LACTATED RINGERS IV SOLN
INTRAVENOUS | Status: DC
  Administered 2017-11-18 (×4): via INTRAVENOUS

## 2017-11-18 MED ORDER — GLYCOPYRROLATE 0.2 MG/ML IJ SOLN
INTRAMUSCULAR | Status: DC | PRN
  Administered 2017-11-18: 0.2 mg via INTRAVENOUS

## 2017-11-18 MED ORDER — DEXAMETHASONE SODIUM PHOSPHATE 4 MG/ML IJ SOLN
INTRAMUSCULAR | Status: DC | PRN
  Administered 2017-11-18: 10 mg via INTRAVENOUS

## 2017-11-18 MED ORDER — LEVOTHYROXINE SODIUM 137 MCG PO TABS
137.0000 ug | ORAL_TABLET | Freq: Every day | ORAL | Status: DC
  Administered 2017-11-19: 137 ug via ORAL

## 2017-11-18 MED ORDER — SODIUM CHLORIDE 0.9 % IV SOLN
INTRAVENOUS | Status: DC
  Administered 2017-11-18 – 2017-11-19 (×2): via INTRAVENOUS

## 2017-11-18 MED ORDER — LOSARTAN POTASSIUM 25 MG PO TABS: 25.0000 mg | ORAL_TABLET | Freq: Every day | ORAL | Status: DC

## 2017-11-18 MED ORDER — OXYCODONE HCL 5 MG PO TABS
5.0000 mg | ORAL_TABLET | ORAL | Status: DC | PRN
  Administered 2017-11-18: 5 mg via ORAL
  Administered 2017-11-18 – 2017-11-19 (×3): 10 mg via ORAL
  Filled 2017-11-18 (×3): qty 2
  Filled 2017-11-18: qty 1

## 2017-11-18 MED ORDER — ACETAMINOPHEN 500 MG PO TABS
1000.0000 mg | ORAL_TABLET | ORAL | Status: AC
  Administered 2017-11-18: 1000 mg via ORAL

## 2017-11-18 MED ORDER — SCOPOLAMINE 1 MG/3DAYS TD PT72: 1.0000 | MEDICATED_PATCH | Freq: Once | TRANSDERMAL | Status: DC | PRN

## 2017-11-18 MED ORDER — DEXAMETHASONE SODIUM PHOSPHATE 10 MG/ML IJ SOLN
INTRAMUSCULAR | Status: AC
  Filled 2017-11-18: qty 1

## 2017-11-18 MED ORDER — BUPIVACAINE HCL (PF) 0.25 % IJ SOLN
INTRAMUSCULAR | Status: AC
  Filled 2017-11-18: qty 30

## 2017-11-18 MED ORDER — 0.9 % SODIUM CHLORIDE (POUR BTL) OPTIME
TOPICAL | Status: DC | PRN
  Administered 2017-11-18 (×2): 1000 mL

## 2017-11-18 MED ORDER — KETOROLAC TROMETHAMINE 15 MG/ML IJ SOLN
INTRAMUSCULAR | Status: AC
Start: 2017-11-18 — End: ?
  Filled 2017-11-18: qty 1

## 2017-11-18 MED ORDER — SODIUM CHLORIDE 0.9 % IJ SOLN
INTRAVENOUS | Status: DC | PRN
  Administered 2017-11-18: 5 mL via INTRAMUSCULAR

## 2017-11-18 SURGICAL SUPPLY — 75 items
ADH SKN CLS APL DERMABOND .7 (GAUZE/BANDAGES/DRESSINGS) ×4
ALLOGRAFT PERF 16X20 1.6+/-0.4 (Tissue) ×2 IMPLANT
APPLIER CLIP 11 MED OPEN (CLIP) ×2
APR CLP MED 11 20 MLT OPN (CLIP) ×1
BAG DECANTER FOR FLEXI CONT (MISCELLANEOUS) ×2 IMPLANT
BINDER BREAST XXLRG (GAUZE/BANDAGES/DRESSINGS) ×1 IMPLANT
BLADE SURG 10 STRL SS (BLADE) ×5 IMPLANT
BLADE SURG 15 STRL LF DISP TIS (BLADE) ×2 IMPLANT
BLADE SURG 15 STRL SS (BLADE) ×2
CANISTER SUCT 1200ML W/VALVE (MISCELLANEOUS) ×4 IMPLANT
CHLORAPREP W/TINT 26ML (MISCELLANEOUS) ×3 IMPLANT
CLIP APPLIE 11 MED OPEN (CLIP) IMPLANT
COVER BACK TABLE 60X90IN (DRAPES) ×2 IMPLANT
COVER MAYO STAND STRL (DRAPES) ×5 IMPLANT
COVER PROBE W GEL 5X96 (DRAPES) ×2 IMPLANT
DERMABOND ADVANCED (GAUZE/BANDAGES/DRESSINGS) ×4
DERMABOND ADVANCED .7 DNX12 (GAUZE/BANDAGES/DRESSINGS) ×2 IMPLANT
DRAIN CHANNEL 15F RND FF W/TCR (WOUND CARE) ×2 IMPLANT
DRAIN CHANNEL 19F RND (DRAIN) ×3 IMPLANT
DRAPE TOP ARMCOVERS (MISCELLANEOUS) ×3 IMPLANT
DRAPE U-SHAPE 76X120 STRL (DRAPES) ×3 IMPLANT
DRAPE UTILITY XL STRL (DRAPES) ×2 IMPLANT
DRSG PAD ABDOMINAL 8X10 ST (GAUZE/BANDAGES/DRESSINGS) ×6 IMPLANT
DRSG TEGADERM 4X10 (GAUZE/BANDAGES/DRESSINGS) ×3 IMPLANT
ELECT BLADE 4.0 EZ CLEAN MEGAD (MISCELLANEOUS) ×2
ELECT COATED BLADE 2.86 ST (ELECTRODE) ×2 IMPLANT
ELECT REM PT RETURN 9FT ADLT (ELECTROSURGICAL) ×2
ELECTRODE BLDE 4.0 EZ CLN MEGD (MISCELLANEOUS) ×1 IMPLANT
ELECTRODE REM PT RTRN 9FT ADLT (ELECTROSURGICAL) ×1 IMPLANT
EVACUATOR SILICONE 100CC (DRAIN) ×5 IMPLANT
EXPANDER TISSUE FV FOURTE 500 (Prosthesis & Implant Plastic) IMPLANT
GLOVE BIO SURGEON STRL SZ 6 (GLOVE) ×5 IMPLANT
GLOVE BIO SURGEON STRL SZ7 (GLOVE) ×4 IMPLANT
GLOVE BIOGEL PI IND STRL 7.5 (GLOVE) ×1 IMPLANT
GLOVE BIOGEL PI INDICATOR 7.5 (GLOVE) ×1
GOWN STRL REUS W/ TWL LRG LVL3 (GOWN DISPOSABLE) ×7 IMPLANT
GOWN STRL REUS W/TWL LRG LVL3 (GOWN DISPOSABLE) ×10
KIT FILL SYSTEM UNIVERSAL (SET/KITS/TRAYS/PACK) ×1 IMPLANT
LIGHT WAVEGUIDE WIDE FLAT (MISCELLANEOUS) ×2 IMPLANT
MARKER SKIN DUAL TIP RULER LAB (MISCELLANEOUS) IMPLANT
NDL HYPO 25X1 1.5 SAFETY (NEEDLE) IMPLANT
NDL SAFETY ECLIPSE 18X1.5 (NEEDLE) IMPLANT
NEEDLE HYPO 18GX1.5 SHARP (NEEDLE) ×2
NEEDLE HYPO 25X1 1.5 SAFETY (NEEDLE) ×2 IMPLANT
NS IRRIG 1000ML POUR BTL (IV SOLUTION) ×3 IMPLANT
PACK BASIN DAY SURGERY FS (CUSTOM PROCEDURE TRAY) ×2 IMPLANT
PENCIL BUTTON HOLSTER BLD 10FT (ELECTRODE) ×2 IMPLANT
PIN SAFETY STERILE (MISCELLANEOUS) ×2 IMPLANT
SHEET MEDIUM DRAPE 40X70 STRL (DRAPES) ×2 IMPLANT
SLEEVE SCD COMPRESS KNEE MED (MISCELLANEOUS) ×2 IMPLANT
SPONGE LAP 18X18 RF (DISPOSABLE) ×8 IMPLANT
STAPLER VISISTAT 35W (STAPLE) ×2 IMPLANT
SUT CHROMIC 4 0 PS 2 18 (SUTURE) ×6 IMPLANT
SUT ETHILON 2 0 FS 18 (SUTURE) ×4 IMPLANT
SUT MNCRL AB 4-0 PS2 18 (SUTURE) ×4 IMPLANT
SUT SILK 2 0 SH (SUTURE) ×2 IMPLANT
SUT VIC AB 2-0 SH 18 (SUTURE) ×2 IMPLANT
SUT VIC AB 3-0 SH 27 (SUTURE) ×8
SUT VIC AB 3-0 SH 27X BRD (SUTURE) IMPLANT
SUT VICRYL 0 CT-2 (SUTURE) ×5 IMPLANT
SUT VICRYL 3-0 CR8 SH (SUTURE) ×2 IMPLANT
SUT VICRYL 4-0 PS2 18IN ABS (SUTURE) ×2 IMPLANT
SUT VICRYL AB 3 0 TIES (SUTURE) ×1 IMPLANT
SUT VLOC 180 0 24IN GS25 (SUTURE) ×2 IMPLANT
SYR 50ML LL SCALE MARK (SYRINGE) ×2 IMPLANT
SYR BULB IRRIGATION 50ML (SYRINGE) ×3 IMPLANT
SYR CONTROL 10ML LL (SYRINGE) ×1 IMPLANT
TISSUE EXPNDR FV FOURTE 500 (Prosthesis & Implant Plastic) ×4 IMPLANT
TOWEL GREEN STERILE FF (TOWEL DISPOSABLE) ×6 IMPLANT
TOWEL OR NON WOVEN STRL DISP B (DISPOSABLE) ×2 IMPLANT
TRAY DSU PREP LF (CUSTOM PROCEDURE TRAY) ×2 IMPLANT
TRAY FOLEY W/BAG SLVR 14FR (SET/KITS/TRAYS/PACK) ×1 IMPLANT
TUBE CONNECTING 20X1/4 (TUBING) ×3 IMPLANT
UNDERPAD 30X30 (UNDERPADS AND DIAPERS) ×4 IMPLANT
YANKAUER SUCT BULB TIP NO VENT (SUCTIONS) ×3 IMPLANT

## 2017-11-18 NOTE — Op Note (Signed)
Preoperative diagnosis: Clinical stage III right breast cancer status post primary chemotherapy Postoperative diagnosis: Same as above Procedure: 1.  Left risk reducing mastectomy 2.  Right total mastectomy 3.  Injection of blue dye for sentinel lymph node identification 4.  Right axillary sentinel lymph node biopsy Surgeon: Dr. Serita Grammes Estimated blood loss: 600 cc Anesthesia: General with bilateral pectoral blocks Drains: Per plastic surgery Complications: None Specimens: 1.  Left mastectomy marked short superior, long lateral 2.  Right mastectomy marked short superior, long lateral 3.  Right axillary sentinel lymph nodes with highest count of 2203 Sponge needle count was correct Disposition Case turned over to plastic surgery for reconstruction  Indications: This is a 47 year old female who presents with a large right breast cancer.  This was HER-2 positive and she underwent primary chemotherapy with a very modest response.  We then discussed surgery.  She needed a mastectomy on the right side as well as a sentinel lymph node biopsy.  She also desired a left risk reducing mastectomy.  She was going to have reconstruction as well.  Procedure: After informed consent was obtained the patient was taken to the operating room.  She had undergone bilateral pectoral blocks.  She was given antibiotics.  SCDs were in place.  She had undergone the standard periareolar injection of technetium on the right side.  She was then placed under general anesthesia without complication.  Her chest was prepped and draped in the standard sterile surgical fashion.  A surgical timeout was then performed.  I performed a left risk reducing mastectomy first.  The skin was marked by plastic surgery.  I then made a trying in her incision surrounding the nipple areolar complex.  I then created flaps to the parasternal area, inframammary crease, clavicle, and latissimus laterally.  There were a lot of vessels in  this side accounted for the large majority of the bleeding.  I had to place a number of 3-0 Vicryl sutures and vessels.  Once I had created the flaps I then remove the breast as well as the pectoralis fascia from the muscle without difficulty.  This was then marked as above and passed off the table.  I obtained hemostasis and plastic surgery began working on the side at the same time.  I first injected a saline methylene blue dye mixture under the areola and massaged this.   I then performed the right mastectomy through a similar incision.  I created flaps in a similar fashion.  I was around the tumor clinically.  The anterior margin of the tumor appeared very close to some of the skin but this is only skin at this point.  I then remove the breast as well as the pectoralis fascia from the muscle and marked this as above.  I then entered the axilla.  I was able to identify what appeared to be several small blue and radioactive lymph nodes.  Additionally there was what appeared to be an enlarged lymph node that was right next to this.  These were all removed with the counts listed as above.  The background radioactivity was near 0.  There was no more blue dye or palpable adenopathy.  I then obtained hemostasis on this side.  I packed this for plastic surgery later.

## 2017-11-18 NOTE — Interval H&P Note (Signed)
History and Physical Interval Note:  11/18/2017 8:17 AM  Rebecca [name]  has presented today for surgery, with the diagnosis of RIGHT BREAST CANCER  The various methods of treatment have been discussed with the patient and family. After consideration of risks, benefits and other options for treatment, the patient has consented to  Procedure(s): BILATERAL TOTAL MASTECTOMIES WITH RIGHT SENTINEL LYMPH NODE BIOPSY (Bilateral) BILATERAL BREAST RECONSTRUCTION WITH PLACEMENT OF TISSUE EXPANDER AND ALLODERM (Right) as a surgical intervention .  The patient's history has been reviewed, patient examined, no change in status, stable for surgery.  I have reviewed the patient's chart and labs.  Questions were answered to the patient's satisfaction.     Rebecca [name]

## 2017-11-18 NOTE — Anesthesia Preprocedure Evaluation (Addendum)
Anesthesia Evaluation  Patient identified by MRN, date of birth, ID band Patient awake    Reviewed: Allergy & Precautions, NPO status , Patient's Chart, lab work & pertinent test results  Airway Mallampati: II  TM Distance: >3 FB     Dental   Pulmonary former smoker,    breath sounds clear to auscultation       Cardiovascular + dysrhythmias  Rhythm:Regular Rate:Normal     Neuro/Psych    GI/Hepatic Neg liver ROS, hiatal hernia, GERD  ,  Endo/Other  diabetesHypothyroidism   Renal/GU      Musculoskeletal   Abdominal   Peds  Hematology   Anesthesia Other Findings   Reproductive/Obstetrics                            Anesthesia Physical Anesthesia Plan  ASA: III  Anesthesia Plan: General   Post-op Pain Management:    Induction: Intravenous  PONV Risk Score and Plan: Treatment may vary due to age or medical condition, Ondansetron, Dexamethasone and Midazolam  Airway Management Planned: Oral ETT  Additional Equipment:   Intra-op Plan:   Post-operative Plan: Extubation in OR  Informed Consent: I have reviewed the patients History and Physical, chart, labs and discussed the procedure including the risks, benefits and alternatives for the proposed anesthesia with the patient or authorized representative who has indicated his/her understanding and acceptance.     Plan Discussed with: CRNA and Anesthesiologist  Anesthesia Plan Comments:         Anesthesia Quick Evaluation

## 2017-11-18 NOTE — H&P (Signed)
45 yom with right breast cancer I saw prior to chemo. she has no personal history breast disease, no family history breast or ovarian cancer. she had no discharge. she noted a rigth breast mass. she underwent a mm that showed d density breasts. on [place] she has a 5.1x3x3.1 cm mass at 830 2 cm from the nipple. there is also a small probable cyst. the right axilla is negative. biopsy of the mass was done and is grade III IDC that is er/pr neg, her 2 positive. she had mri that showed a 4.5x4 cm mass with suspicious ax nodes (4). she has undergoneprimary chemo with TCHP. the mass is still present and she is due for mri this week. she is also due to see plastic surgery this week. she has done pretty well with chemo. she remains very active.    Allergies Sabino Gasser; 11/05/2017 3:03 PM) No Known Drug Allergies [01/07/2014]: Allergies Reconciled   Medication History Sabino Gasser; 11/05/2017 3:03 PM) Phentermine HCl (30MG  Capsule, Oral) Active. Levothyroxine Sodium (137MCG Tablet, Oral) Active. MetFORMIN HCl ER (500MG  Tablet ER 24HR, Oral) Active. Propranolol HCl ER (120MG  Capsule ER 24HR, Oral) Active. Adderall (30MG  Tablet, Oral) Active. Citracal Maximum (315-250MG -UNIT Tablet, Oral) Active. Lodine (400MG  Tablet, Oral) Active. Ativan (2MG  Tablet, Oral) Active. Vitamin B-12 (250MCG Tablet, Oral) Active. Ibuprofen (200MG  Capsule, Oral) Active. Zonisamide (100MG  Capsule, Oral) Active. Medications Reconciled  Past Surgical History Rolm Bookbinder, MD; 07/10/2017 5:53 PM) Cesarean Section - Multiple   Diagnostic Studies History Rolm Bookbinder, MD; 07/10/2017 5:53 PM) Colonoscopy  never Mammogram  within last year Pap Smear  1-5 years ago  . Social History Rolm Bookbinder, MD; 07/10/2017 5:53 PM) Alcohol use  Occasional alcohol use. Caffeine use  Coffee. No drug use  Tobacco use  Former smoker.  Family History Rolm Bookbinder, MD; 07/10/2017 5:53  PM) Diabetes Mellitus  Mother.  ROS fatigue otherwise negative  Vitals Sabino Gasser; 11/05/2017 3:04 PM) 11/05/2017 3:03 PM Weight: 227.13 lb Height: 70in Body Surface Area: 2.2 m Body Mass Index: 32.59 kg/m  Temp.: 99.74F(Oral)  Pulse: 84 (Regular)  BP: 122/80 (Sitting, Left Arm, Standard) Physical Exam Rolm Bookbinder MD; 11/05/2017 10:22 PM) General Mental Status-Alert. Orientation-Oriented X3. Head and Neck Trachea-midline. Eye Sclera/Conjunctiva - Bilateral-No scleral icterus. Chest and Lung Exam Chest and lung exam reveals -quiet, even and easy respiratory effort with no use of accessory muscles and on auscultation, normal breath sounds, no adventitious sounds and normal vocal resonance. Breast Note: 3.5-4 cm mass mobile nontender Cardiovascular Cardiovascular examination reveals -normal heart sounds, regular rate and rhythm with no murmurs. Lymphatic Head & Neck General Head & Neck Lymphatics: Bilateral - Description - Normal. Axillary General Axillary Region: Bilateral - Description - Normal. Note: no  adenopathy   Assessment & Plan Rolm Bookbinder MD; 11/05/2017 10:26 PM) BREAST CANCER OF LOWER-OUTER QUADRANT OF RIGHT FEMALE BREAST (C50.511) Story: Right mastectomy, right ax sn biopsy, risk reducing left mastectomy we had long conversation today and after discussion of lumpectomy vs mastectomy she is adamant that she have bilateral mastectomies. she understands that there is no medical advantage to left mastectomy and that is not necessary. she very much desires this though. she will see Dr Iran Planas this week and I can discuss with her nsm as possibilty. she hopefully will not get radiation pending nodal evaluation. also discussed left ax sn biopsy at same time. Once she sees Dr Iran Planas will schedule about 3-4 weeks after chemo. she has mri pending that will follow up on but I dont  think this will change plan.

## 2017-11-18 NOTE — Op Note (Signed)
Operative Note   DATE OF OPERATION: 7.23.19  LOCATION: [facility]-observation  SURGICAL DIVISION: Plastic Surgery  PREOPERATIVE DIAGNOSES:  1. Right breast cancer LOQ ER- 2. Neoadjuvant chemotherapy  POSTOPERATIVE DIAGNOSES:  same  PROCEDURE:  1. Bilateral breast reconstruction with tissue expanders 2. Acellular dermis (Alloderm) for breast reconstruction 600 cm2  SURGEON: Irene Limbo MD MBA  ASSISTANT: none  ANESTHESIA:  General.   EBL: 725 ml for entire procedure  COMPLICATIONS: None immediate.   INDICATIONS FOR PROCEDURE:  The patient, Rebecca [name], is a 47 y.o. female born on 11-May-1970, is here for immediate prepectoral expander ADM reconstruction following skin reduction pattern mastectomies.   FINDINGS: Natrelle 133S-FV-13 T 500 ml tissue expanders placed bilateral, initial fill volume 385 ml air. RIGHT SN [id number] LEFT SN [id number]  DESCRIPTION OF PROCEDURE:  The patient was marked with the patient in the preoperative area to mark sternal notch, chest midline, anterior axillary lines and inframammary folds. Patient was marked for skin reduction mastectomy with most superior portion nipple areola marked on breast meridian. Vertical limbs marked by breast displacement and set at 9 cm length. The patient was taken to the operating room. SCDs were placed and IV antibiotics were given. Foley catheter placed. The patient's operative site was prepped and draped in a sterile fashion. A time out was performed and all information was confirmed to be correct. In supine position, the lateral limbs for resection marked and area over lower pole preserved as inferiorly based dermal pedicle. Skin de epithelialized in this area. I assisted in mastectomies and sentinel node dissectionwith retraction and exposure.Following completion of mastectomies, reconstruction began on left side.  The cavity was irrigated with solution containing polymyxin and bacitracn. Hemostasis was  ensured. A 19 Fr drain was placed in subcutaneous position laterally anda 15 Fr drain placed along inframammary fold. Eachsecured to skin with 2-0 nylon. Cavity irrigated with Betadine.The tissue expanderswere prepared on back table prior in insertion. The expander was filled with air to 385  ml. Perforated acellular dermis was  draped over anterior surface expander. The ADM was then secured to itself over posterior surface of expander. Redundant folds acellular dermis excised so that the ADM lied flat without folds over air filled expander.The expander was secured to medial insertion pectoralis with a 0 vicryl.The superior and lateral tabs also secured to pectoralis muscle with 0-vicryl. The ADM was secured to pectoralis muscle and chest wall along inferior border at inframammary fold.Laterally the mastectomy flap over posterior axillary line was advanced anteriorly and the subcutaneous tissue and superficial fascia was secured to pectoralis muscle and acellular dermis with 0-vicryl. The inferiorly based dermal pedicle was redraped superiorly over expander and acellular dermis and secured to pectoralis with interrupted 0-vicryl. Skin closure completedwith 3-0 vicryl in fascial layer and 4-0 vicryl in dermis. Skin closure completed with 4-0 monocryl subcuticular and tissue adhesive.  I then directed my attention to right chest where similar irrigation and drain placement completed. The prepared expander with ADM secured over anterior surface was placed in right chest and tabs secured to chest wall and pectoralis muscle with 0- vicryl suture. The acellular dermis at inframammary fold was secured to chest wall with 0 V-lock suture.Laterally the mastectomy flap over posterior axillary line was advanced anteriorly and the subcutaneous tissue and superficial fascia was secured to pectoralis muscle and acellular dermis with 0-vicryl. The inferiorly based dermal pedicle was redraped superiorly over expander and  acellular dermis and secured to pectoralis with interrupted 0-vicryl. Skin closure completedwith 3-0  vicryl in fascial layer and 4-0 vicryl in dermis. Skin closure completed with 4-0 monocryl subcuticular and tissue adhesive. Tegaderms applied bilateral, followed by dry dressing and breast binder.  The patient was allowed to wake from anesthesia, extubated and taken to the recovery room in satisfactory condition.   SPECIMENS: none  DRAINS: 15 and 19 Fr JP in left and right breast reconstruction  Irene Limbo, MD [facility] [phone], pin [phone]

## 2017-11-18 NOTE — Anesthesia Procedure Notes (Signed)
Anesthesia Regional Block: Pectoralis block   Pre-Anesthetic Checklist: ,, timeout performed, Correct Patient, Correct Site, Correct Laterality, Correct Procedure, Correct Position, site marked, Risks and benefits discussed,  Surgical consent,  Pre-op evaluation,  At surgeon's request and post-op pain management  Laterality: Right and Left  Prep: chloraprep       Needles:  Injection technique: Single-shot  Needle Type: Stimulator Needle - 40          Additional Needles:   Procedures: Doppler guided,,,, ultrasound used (permanent image in chart),,,,  Narrative:  Start time: 11/18/2017 8:30 AM End time: 11/18/2017 8:50 AM Injection made incrementally with aspirations every 5 mL.  Performed by: Personally  Anesthesiologist: Belinda Block, MD

## 2017-11-18 NOTE — Transfer of Care (Signed)
Immediate Anesthesia Transfer of Care Note  Patient: Rebecca [name]  Procedure(s) Performed: BILATERAL TOTAL MASTECTOMIES WITH RIGHT SENTINEL LYMPH NODE BIOPSY (Bilateral Breast) BILATERAL BREAST RECONSTRUCTION WITH PLACEMENT OF TISSUE EXPANDER AND ALLODERM (Bilateral Breast)  Patient Location: PACU  Anesthesia Type:GA combined with regional for post-op pain  Level of Consciousness: awake  Airway & Oxygen Therapy: Patient Spontanous Breathing and Patient connected to face mask oxygen  Post-op Assessment: Report given to RN and Post -op Vital signs reviewed and stable  Post vital signs: Reviewed and stable  Last Vitals:  Vitals Value Taken Time  BP    Temp    Pulse    Resp    SpO2      Last Pain: There were no vitals filed for this visit.       Complications: No apparent anesthesia complications

## 2017-11-18 NOTE — Anesthesia Procedure Notes (Signed)
Procedure Name: Intubation Date/Time: 11/18/2017 9:22 AM Performed by: Lyndee Leo, CRNA Pre-anesthesia Checklist: Patient identified, Emergency Drugs available, Suction available and Patient being monitored Patient Re-evaluated:Patient Re-evaluated prior to induction Oxygen Delivery Method: Circle system utilized Preoxygenation: Pre-oxygenation with 100% oxygen Induction Type: IV induction Ventilation: Mask ventilation without difficulty Laryngoscope Size: Miller and 2 Grade View: Grade I Tube type: Oral Tube size: 7.0 mm Number of attempts: 1 Airway Equipment and Method: Stylet and Oral airway Placement Confirmation: ETT inserted through vocal cords under direct vision,  positive ETCO2 and breath sounds checked- equal and bilateral Secured at: 22 cm Tube secured with: Tape Dental Injury: Teeth and Oropharynx as per pre-operative assessment

## 2017-11-18 NOTE — Anesthesia Postprocedure Evaluation (Signed)
Anesthesia Post Note  Patient: Amore Ackman Perona  Procedure(s) Performed: BILATERAL TOTAL MASTECTOMIES WITH RIGHT SENTINEL LYMPH NODE BIOPSY (Bilateral Breast) BILATERAL BREAST RECONSTRUCTION WITH PLACEMENT OF TISSUE EXPANDER AND ALLODERM (Bilateral Breast)     Patient location during evaluation: PACU Anesthesia Type: General Level of consciousness: awake Pain management: pain level controlled Vital Signs Assessment: post-procedure vital signs reviewed and stable Respiratory status: spontaneous breathing Cardiovascular status: stable Anesthetic complications: no    Last Vitals:  Vitals:   11/18/17 1245 11/18/17 1300  BP: 115/76 124/78  Pulse: 72 69  Resp: 12 15  SpO2: 100% 100%    Last Pain: There were no vitals filed for this visit.               Kinsley Nicklaus

## 2017-11-18 NOTE — Interval H&P Note (Signed)
History and Physical Interval Note:  11/18/2017 8:44 AM  Rebecca [name]  has presented today for surgery, with the diagnosis of RIGHT BREAST CANCER  The various methods of treatment have been discussed with the patient and family. After consideration of risks, benefits and other options for treatment, the patient has consented to  Procedure(s): BILATERAL TOTAL MASTECTOMIES WITH RIGHT SENTINEL LYMPH NODE BIOPSY (Bilateral) BILATERAL BREAST RECONSTRUCTION WITH PLACEMENT OF TISSUE EXPANDER AND ALLODERM (Right) as a surgical intervention .  The patient's history has been reviewed, patient examined, no change in status, stable for surgery.  I have reviewed the patient's chart and labs.  Questions were answered to the patient's satisfaction.     Rolm Bookbinder

## 2017-11-18 NOTE — Progress Notes (Signed)
Assisted Dr. Oletta Lamas with right, left, ultrasound guided, pectoralis block. Side rails up, monitors on throughout procedure. See vital signs in flow sheet. Tolerated Procedure well.

## 2017-11-19 ENCOUNTER — Encounter (HOSPITAL_BASED_OUTPATIENT_CLINIC_OR_DEPARTMENT_OTHER): Payer: Self-pay | Admitting: General Surgery

## 2017-11-19 DIAGNOSIS — C50511 Malignant neoplasm of lower-outer quadrant of right female breast: Secondary | ICD-10-CM | POA: Diagnosis not present

## 2017-11-19 NOTE — Discharge Summary (Signed)
Physician Discharge Summary  Patient ID: Rebecca [name] MRN: [id number] DOB/AGE: November 28, 1970 47 y.o.  Admit date: 11/18/2017 Discharge date: 11/19/2017  Admission Diagnoses: Breast cancer Diabetes  Discharge Diagnoses:  Active Problems:   Breast cancer, right [facility])   Discharged Condition: good  Hospital Course: 66 yof s/p primary chemotherapy for right breast cancer with minimal response. She underwent bilateral mastectomies with right axillary sentinel node biopsy followed by immediate expander reconstruction. She has done well postop. Drains with expected output, pain controlled, tolerating diet.   Consults: None  Significant Diagnostic Studies: none  Treatments: surgery: bilateral mastectomies, right ax sn biopsy, expander placement  Discharge Exam: Blood pressure (!) 93/53, pulse 80, temperature 98.3 F (36.8 C), resp. rate 16, height 5\' 10"  (1.778 m), weight 101.2 kg (223 lb), SpO2 100 %. General appearance: no distress Breasts: drains with serosang output, flaps viable, no hematoma  Disposition: Discharge disposition: 01-Home or Self Care       Discharge Instructions    Call MD for:  redness, tenderness, or signs of infection (pain, swelling, bleeding, redness, odor or green/yellow discharge around incision site)   Complete by:  As directed    Call MD for:  temperature >100.5   Complete by:  As directed    Discharge instructions   Complete by:  As directed    Ok to remove dressings and shower am 7.25.19. Soap and water ok, pat Tegaderms dry. Leave Tegaderms in place. No creams or ointments over incisions. Do not let drains dangle in shower, attach to lanyard or similar.Strip and record drains twice daily and bring log to clinic visit.  Breast binder or soft compression bra all other times.  Ok to raise arms above shoulders for bathing and dressing.  No house yard work or exercise until cleared by MD.   Recommend Motrin as directed with meals to aid with pain  control. Recommend Miralax or Dulcolax as needed for constipation.   Driving Restrictions   Complete by:  As directed    No driving for 2 weeks.   Lifting restrictions   Complete by:  As directed    No lifting > 5 lbs until cleared by MD   Resume previous diet   Complete by:  As directed      Allergies as of 11/19/2017   Not on File     Medication List    STOP taking these medications   dexamethasone 4 MG tablet Commonly known as:  DECADRON   magic mouthwash w/lidocaine Soln   ondansetron 8 MG tablet Commonly known as:  ZOFRAN     TAKE these medications   carvedilol 3.125 MG tablet Commonly known as:  COREG Take 1 tablet (3.125 mg total) by mouth 2 (two) times daily.   levothyroxine 137 MCG tablet Commonly known as:  SYNTHROID, LEVOTHROID Take 137 mcg by mouth daily before breakfast.   lidocaine-prilocaine cream Commonly known as:  EMLA Apply to affected area once   LORazepam 1 MG tablet Commonly known as:  ATIVAN Take 1 tablet (1 mg total) by mouth every 6 (six) hours as needed for anxiety. What changed:  Another medication with the same name was removed. Continue taking this medication, and follow the directions you see here.   losartan 25 MG tablet Commonly known as:  COZAAR Take 1 tablet (25 mg total) by mouth daily.   metFORMIN 500 MG tablet Commonly known as:  GLUCOPHAGE Take by mouth daily with breakfast.   methocarbamol 500 MG tablet Commonly known as:  ROBAXIN Take 1 tablet (500 mg total) by mouth every 8 (eight) hours as needed for muscle spasms.   NEXIUM 40 MG capsule Generic drug:  esomeprazole Nexium 40 mg capsule,delayed release  Take 1 capsule every day by oral route.   oxyCODONE 5 MG immediate release tablet Commonly known as:  Oxy IR/ROXICODONE Take 1-2 tablets (5-10 mg total) by mouth every 4 (four) hours as needed for moderate pain.   prochlorperazine 10 MG tablet Commonly known as:  COMPAZINE Take 1 tablet (10 mg total) by mouth  every 6 (six) hours as needed (Nausea or vomiting).   sulfamethoxazole-trimethoprim 800-160 MG tablet Commonly known as:  BACTRIM DS,SEPTRA DS Take 1 tablet by mouth 2 (two) times daily.   triamterene-hydrochlorothiazide 75-50 MG tablet Commonly known as:  MAXZIDE One po daily prn lower extremity edema      Follow-up Information    Rolm Bookbinder, MD In 2 weeks.   Specialty:  General Surgery Contact information: [address] [place] [address] [phone]        Irene Limbo, MD In 1 week.   Specialty:  Plastic Surgery Why:  as scheduled Contact information: [address] [place] [address] [phone]           Signed: Rolm Bookbinder 11/19/2017, 7:40 AM

## 2017-11-19 NOTE — Addendum Note (Signed)
Addendum  created 11/19/17 9290 by Tawni Millers, CRNA   Charge Capture section accepted

## 2017-11-19 NOTE — Discharge Instructions (Signed)
[facility] [place] °[phone] ° °MASTECTOMY: POST OP INSTRUCTIONS ° °Always review your discharge instruction sheet given to you by the facility where your surgery was performed. °IF YOU HAVE DISABILITY OR FAMILY LEAVE FORMS, YOU MUST BRING THEM TO THE OFFICE FOR PROCESSING.   °DO NOT GIVE THEM TO YOUR DOCTOR. °A prescription for pain medication may be given to you upon discharge.  Take your pain medication as prescribed, if needed.  If narcotic pain medicine is not needed, then you may take acetaminophen (Tylenol), naprosyn (Alleve) or ibuprofen (Advil) as needed. °1. Take your usually prescribed medications unless otherwise directed. °2. If you need a refill on your pain medication, please contact your pharmacy.  They will contact our office to request authorization.  Prescriptions will not be filled after 5pm or on week-ends. °3. You should follow a light diet the first few days after arrival home, such as soup and crackers, etc.  Resume your normal diet the day after surgery. °4. Most patients will experience some swelling and bruising on the chest and underarm.  Ice packs will help.  Swelling and bruising can take several days to resolve. Wear the binder day and night until you return to the office.  °5. It is common to experience some constipation if taking pain medication after surgery.  Increasing fluid intake and taking a stool softener (such as Colace) will usually help or prevent this problem from occurring.  A mild laxative (Milk of Magnesia or Miralax) should be taken according to package instructions if there are no bowel movements after 48 hours. °6. Unless discharge instructions indicate otherwise, leave your bandage dry and in place until your next appointment in 3-5 days.  You may take a limited sponge bath.  No tube baths or showers until the drains are removed.  You may have steri-strips (small skin tapes) in place directly over the incision.  These strips should be left on the  skin for 7-10 days. If you have glue it will come off in next couple week.  Any sutures will be removed at an office visit °7. DRAINS:  If you have drains in place, it is important to keep a list of the amount of drainage produced each day in your drains.  Before leaving the hospital, you should be instructed on drain care.  Call our office if you have any questions about your drains. I will remove your drains when they put out less than 30 cc or ml for 2 consecutive days. °8. ACTIVITIES:  You may resume regular (light) daily activities beginning the next day--such as daily self-care, walking, climbing stairs--gradually increasing activities as tolerated.  You may have sexual intercourse when it is comfortable.  Refrain from any heavy lifting or straining until approved by your doctor. °a. You may drive when you are no longer taking prescription pain medication, you can comfortably wear a seatbelt, and you can safely maneuver your car and apply brakes. °b. RETURN TO WORK:  __________________________________________________________ °9. You should see your doctor in the office for a follow-up appointment approximately 3-5 days after your surgery.  Your doctor’s nurse will typically make your follow-up appointment when she calls you with your pathology report.  Expect your pathology report 3-4business days after surgery. °10. OTHER INSTRUCTIONS: ______________________________________________________________________________________________ ____________________________________________________________________________________________ °WHEN TO CALL YOUR DR [place]: °1. Fever over 101.0 °2. Nausea and/or vomiting °3. Extreme swelling or bruising °4. Continued bleeding from incision. °5. Increased pain, redness, or drainage from the incision. °The clinic staff is available   to answer your questions during regular business hours.  Please dont hesitate to call and ask to speak to one of the nurses for clinical concerns.  If  you have a medical emergency, go to the nearest emergency room or call 911.  A surgeon from [facility] is always on call at the hospital. [address] [place], [place], [place]  [address] ? [address], [place], [place]   [address] [phone] ? [phone] ? FAX [phone] Web site: [email or web address]  Post Anesthesia Home Care Instructions  Activity: Get plenty of rest for the remainder of the day. A responsible individual must stay with you for 24 hours following the procedure.  For the next 24 hours, DO NOT: -Drive a car -Paediatric nurse -Drink alcoholic beverages -Take any medication unless instructed by your physician -Make any legal decisions or sign important papers.  Meals: Start with liquid foods such as gelatin or soup. Progress to regular foods as tolerated. Avoid greasy, spicy, heavy foods. If nausea and/or vomiting occur, drink only clear liquids until the nausea and/or vomiting subsides. Call your physician if vomiting continues.  Special Instructions/Symptoms: Your throat may feel dry or sore from the anesthesia or the breathing tube placed in your throat during surgery. If this causes discomfort, gargle with warm salt water. The discomfort should disappear within 24 hours.  If you had a scopolamine patch placed behind your ear for the management of post- operative nausea and/or vomiting:  1. The medication in the patch is effective for 72 hours, after which it should be removed.  Wrap patch in a tissue and discard in the trash. Wash hands thoroughly with soap and water. 2. You may remove the patch earlier than 72 hours if you experience unpleasant side effects which may include dry mouth, dizziness or visual disturbances. 3. Avoid touching the patch. Wash your hands with soap and water after contact with the patch.       JP Drain [employer] this sheet to all of your post-operative appointments while you have your  drains.  Please measure your drains by CC's or ML's.  Make sure you drain and measure your JP Drains 2 or 3 times per day.  At the end of each day, add up totals for the left side and add up totals for the right side.    ( 9 am )     ( 3 pm )        ( 9 pm )                Date L  R  L  R  L  R  Total L/R

## 2017-11-25 ENCOUNTER — Inpatient Hospital Stay (HOSPITAL_BASED_OUTPATIENT_CLINIC_OR_DEPARTMENT_OTHER): Payer: BLUE CROSS/BLUE SHIELD | Admitting: Hematology and Oncology

## 2017-11-25 ENCOUNTER — Encounter: Payer: Self-pay | Admitting: *Deleted

## 2017-11-25 ENCOUNTER — Telehealth: Payer: Self-pay | Admitting: Hematology and Oncology

## 2017-11-25 ENCOUNTER — Inpatient Hospital Stay: Payer: BLUE CROSS/BLUE SHIELD

## 2017-11-25 VITALS — BP 124/81 | HR 88 | Temp 98.8°F | Resp 18 | Ht 70.0 in | Wt 226.3 lb

## 2017-11-25 DIAGNOSIS — N920 Excessive and frequent menstruation with regular cycle: Secondary | ICD-10-CM | POA: Diagnosis not present

## 2017-11-25 DIAGNOSIS — C50411 Malignant neoplasm of upper-outer quadrant of right female breast: Secondary | ICD-10-CM

## 2017-11-25 DIAGNOSIS — Z171 Estrogen receptor negative status [ER-]: Secondary | ICD-10-CM

## 2017-11-25 DIAGNOSIS — Z79899 Other long term (current) drug therapy: Secondary | ICD-10-CM

## 2017-11-25 DIAGNOSIS — R5383 Other fatigue: Secondary | ICD-10-CM

## 2017-11-25 DIAGNOSIS — D500 Iron deficiency anemia secondary to blood loss (chronic): Secondary | ICD-10-CM | POA: Diagnosis not present

## 2017-11-25 MED ORDER — LIDOCAINE-PRILOCAINE 2.5-2.5 % EX CREA: TOPICAL_CREAM | CUTANEOUS | 3 refills | Status: DC

## 2017-11-25 MED ORDER — DEXAMETHASONE 4 MG PO TABS
4.0000 mg | ORAL_TABLET | Freq: Every day | ORAL | 0 refills | Status: AC
Start: 2017-11-25 — End: 2017-11-27

## 2017-11-25 MED ORDER — PROCHLORPERAZINE MALEATE 10 MG PO TABS: 10.0000 mg | ORAL_TABLET | Freq: Four times a day (QID) | ORAL | 1 refills | Status: DC | PRN

## 2017-11-25 MED ORDER — ONDANSETRON HCL 8 MG PO TABS: 8.0000 mg | ORAL_TABLET | Freq: Two times a day (BID) | ORAL | 1 refills | Status: DC | PRN

## 2017-11-25 NOTE — Progress Notes (Signed)
Patient Care Team: Deland Pretty, MD as PCP - General (Internal Medicine) Baxley, Cresenciano Lick, MD (Internal Medicine)  DIAGNOSIS:  Encounter Diagnosis  Name Primary?  . Malignant neoplasm of upper-outer quadrant of right breast in female, estrogen receptor negative ([place]) Yes    SUMMARY OF ONCOLOGIC HISTORY:   Malignant neoplasm of upper-outer quadrant of right breast in female, estrogen receptor negative ([place])   07/09/2017 Initial Diagnosis    Patient palpated a right breast mass which was evaluated by mammogram and ultrasound and a breast MRI which revealed 4.5 x 4 cm necrotic tumor with suspicious multiple right axillary lymph nodes; biopsy revealed IDC grade 3 ER 0%, PR 0%, HER-2 positive ratio 3.08, Ki-67 70%, T2NX stage IIa/IIb      07/18/2017 - 11/04/2017 Neo-Adjuvant Chemotherapy    TCH Perjeta x6 cycles followed by Herceptin Perjeta maintenance      08/15/2017 Genetic Testing    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  The Multi-Gene Panel offered by [facility] includes sequencing and/or deletion duplication testing of the following 83 genes: ALK, APC, ATM, AXIN2,BAP1,  BARD1, BLM, BMPR1A, BRCA1, BRCA2, BRIP1, CASR, CDC73, CDH1, CDK4, CDKN1B, CDKN1C, CDKN2A (p14ARF), CDKN2A (p16INK4a), CEBPA, CHEK2, CTNNA1, DICER1, DIS3L2, EGFR (c.2369C>T, p.Thr790Met variant only), EPCAM (Deletion/duplication testing only), FH, FLCN, GATA2, GPC3, GREM1 (Promoter region deletion/duplication testing only), HOXB13 (c.251G>A, p.Gly84Glu), HRAS, KIT, MAX, MEN1, MET, MITF (c.952G>A, p.Glu318Lys variant only), MLH1, MSH2, MSH3, MSH6, MUTYH, NBN, NF1, NF2, NTHL1, PALB2, PDGFRA, PHOX2B, PMS2, POLD1, POLE, POT1, PRKAR1A, PTCH1, PTEN, RAD50, RAD51C, RAD51D, RB1, RECQL4, RET, RUNX1, SDHAF2, SDHA (sequence changes only), SDHB, SDHC, SDHD, SMAD4, SMARCA4, SMARCB1, SMARCE1, STK11, SUFU, TERT, TERT, TMEM127, TP53, TSC1, TSC2, VHL, WRN and WT1.  The report date is August 15, 2017.      11/18/2017 Surgery    Bilateral mastectomies: Right mastectomy: IDC grade 3 3.8 cm margins negative, 0/4 lymph nodes negative, ER 0%, PR 0%, HER-2 negative ratio 1.3, IHC HER-2 negative; Ki-67 70%, RCB class II; left mastectomy: [facility]      11/25/2017 -  Chemotherapy    The patient had DOXOrubicin (ADRIAMYCIN) chemo injection 136 mg, 60 mg/m2 = 136 mg, Intravenous,  Once, 0 of 4 cycles palonosetron (ALOXI) injection 0.25 mg, 0.25 mg, Intravenous,  Once, 0 of 4 cycles pegfilgrastim-cbqv (UDENYCA) injection 6 mg, 6 mg, Subcutaneous, Once, 0 of 4 cycles cyclophosphamide (CYTOXAN) 1,360 mg in sodium chloride 0.9 % 250 mL chemo infusion, 600 mg/m2 = 1,360 mg, Intravenous,  Once, 0 of 4 cycles fosaprepitant (EMEND) 150 mg, dexamethasone (DECADRON) 12 mg in sodium chloride 0.9 % 145 mL IVPB, , Intravenous,  Once, 0 of 4 cycles  for chemotherapy treatment.        CHIEF COMPLIANT: Follow-up to review the results of surgery  INTERVAL HISTORY: Rebecca [name] is a 47 year old with above-mentioned history of HER-2 positive breast cancer who received neoadjuvant chemotherapy with [facility] Perjeta for 6 cycles and had 3.8 cm of residual breast cancer.  It turns out on the receptor testing that it is HER-2 negative both by IHC and fish.  She is here today to discuss the significance of these findings and to discuss additional adjuvant treatment plan.  REVIEW OF SYSTEMS:   Constitutional: Denies fevers, chills or abnormal weight loss Eyes: Denies blurriness of vision Ears, nose, mouth, throat, and face: Denies mucositis or sore throat Respiratory: Denies cough, dyspnea or wheezes Cardiovascular: Denies palpitation, chest discomfort Gastrointestinal:  Denies nausea, heartburn or change in bowel habits Skin: Denies abnormal  skin rashes Lymphatics: Denies new lymphadenopathy or easy bruising Neurological:Denies numbness, tingling or new weaknesses Behavioral/Psych: Mood is stable, no new changes  Extremities: No  lower extremity edema Breast: Bilateral mastectomies All other systems were reviewed with the patient and are negative.  I have reviewed the past medical history, past surgical history, social history and family history with the patient and they are unchanged from previous note.  ALLERGIES:  has no allergies on file.  MEDICATIONS:  Current Outpatient Medications  Medication Sig Dispense Refill  . carvedilol (COREG) 3.125 MG tablet Take 1 tablet (3.125 mg total) by mouth 2 (two) times daily. 60 tablet 3  . dexamethasone (DECADRON) 4 MG tablet Take 1 tablet (4 mg total) by mouth daily for 2 days. Take 1 tablet day after chemo and 1 tablet 2 days after chemo with food 8 tablet 0  . esomeprazole (NEXIUM) 40 MG capsule Nexium 40 mg capsule,delayed release  Take 1 capsule every day by oral route.    Marland Kitchen levothyroxine (SYNTHROID, LEVOTHROID) 137 MCG tablet Take 137 mcg by mouth daily before breakfast.    . lidocaine-prilocaine (EMLA) cream Apply to affected area once 30 g 3  . LORazepam (ATIVAN) 1 MG tablet Take 1 tablet (1 mg total) by mouth every 6 (six) hours as needed for anxiety. 12 tablet 0  . losartan (COZAAR) 25 MG tablet Take 1 tablet (25 mg total) by mouth daily. 30 tablet 3  . metFORMIN (GLUCOPHAGE) 500 MG tablet Take by mouth daily with breakfast.    . methocarbamol (ROBAXIN) 500 MG tablet Take 1 tablet (500 mg total) by mouth every 8 (eight) hours as needed for muscle spasms. 30 tablet 0  . ondansetron (ZOFRAN) 8 MG tablet Take 1 tablet (8 mg total) by mouth 2 (two) times daily as needed. Start on the third day after chemotherapy. 30 tablet 1  . oxyCODONE (OXY IR/ROXICODONE) 5 MG immediate release tablet Take 1-2 tablets (5-10 mg total) by mouth every 4 (four) hours as needed for moderate pain. 40 tablet 0  . prochlorperazine (COMPAZINE) 10 MG tablet Take 1 tablet (10 mg total) by mouth every 6 (six) hours as needed (Nausea or vomiting). 30 tablet 1  . sulfamethoxazole-trimethoprim  (BACTRIM DS,SEPTRA DS) 800-160 MG tablet Take 1 tablet by mouth 2 (two) times daily. 14 tablet 0  . triamterene-hydrochlorothiazide (MAXZIDE) 75-50 MG tablet One po daily prn lower extremity edema 30 tablet 2   No current facility-administered medications for this visit.    Facility-Administered Medications Ordered in Other Visits  Medication Dose Route Frequency Provider Last Rate Last Dose  . LORazepam (ATIVAN) injection 1 mg  1 mg Intravenous Once Harle Stanford., PA-C        PHYSICAL EXAMINATION: ECOG PERFORMANCE STATUS: 1 - Symptomatic but completely ambulatory  Vitals:   11/25/17 0843  BP: 124/81  Pulse: 88  Resp: 18  Temp: 98.8 F (37.1 C)  SpO2: 99%   Filed Weights   11/25/17 0843  Weight: 226 lb 4.8 oz (102.6 kg)    GENERAL:alert, no distress and comfortable SKIN: skin color, texture, turgor are normal, no rashes or significant lesions EYES: normal, Conjunctiva are pink and non-injected, sclera clear OROPHARYNX:no exudate, no erythema and lips, buccal mucosa, and tongue normal  NECK: supple, thyroid normal size, non-tender, without nodularity LYMPH:  no palpable lymphadenopathy in the cervical, axillary or inguinal LUNGS: clear to auscultation and percussion with normal breathing effort HEART: regular rate & rhythm and no murmurs and no lower extremity edema  ABDOMEN:abdomen soft, non-tender and normal bowel sounds MUSCULOSKELETAL:no cyanosis of digits and no clubbing  NEURO: alert & oriented x 3 with fluent speech, no focal motor/sensory deficits EXTREMITIES: No lower extremity edema  LABORATORY DATA:  I have reviewed the data as listed CMP Latest Ref Rng & Units 11/04/2017 10/14/2017 09/23/2017  Glucose 70 - 99 mg/dL 110(H) 99 119(H)  BUN 6 - 20 mg/dL _0 Creatinine 0.44 - 1.00 mg/dL 0.90 0.95 0.93  Sodium 135 - 145 mmol/L 140 142 139  Potassium 3.5 - 5.1 mmol/L 4.0 3.9 4.3  Chloride 98 - 111 mmol/L 104 105 102  CO2 22 - 32 mmol/L _1 Calcium 8.9 -  10.3 mg/dL 9.6 9.5 9.6  Total Protein 6.5 - 8.1 g/dL 7.1 6.7 7.8  Total Bilirubin 0.3 - 1.2 mg/dL 0.3 0.2 0.4  Alkaline Phos 38 - 126 U/L 64 55 61  AST 15 - 41 U/L _2 ALT 0 - 44 U/L _3 Lab Results  Component Value Date   WBC 7.4 11/04/2017   HGB 11.4 (L) 11/04/2017   HCT 34.8 11/04/2017   MCV 89.6 11/04/2017   PLT 168 11/04/2017   NEUTROABS 5.3 11/04/2017    ASSESSMENT & PLAN:  Malignant neoplasm of upper-outer quadrant of right breast in female, estrogen receptor negative ([place]) /13/2019:Patient palpated a right breast mass which was evaluated by mammogram and ultrasound and a breast MRI which revealed 4.5 x 4 cm necrotic tumor with suspicious multiple right axillary lymph nodes; biopsy revealed IDC grade 3 ER 0%, PR 0%, HER-2 positive ratio 3.08, Ki-67 70%, T2NX stage IIa/IIb  Treatment planbased on multidisciplinary tumor board: 1. Neoadjuvant chemotherapy with TCH Perjeta 6 cycles followed by Herceptinand Perjetamaintenance for 1 year 2. Followed bybilateral mastectomies with targeted node dissection on the right 3. Followed by adjuvant radiation therapy ----------------------------------------------------------------------------- 11/18/2017:Bilateral mastectomies: Right mastectomy: IDC grade 3 3.8 cm margins negative, 0/4 lymph nodes negative, ER 0%, PR 0%, HER-2 negative ratio 1.3, IHC HER-2 negative; Ki-67 70%, RCB class II; left mastectomy: [facility]  I discussed the surgical pathology report with the patient and provided her with a copy of this report.  It appears that the tumor is now HER-2 negative.  It is possible that the anti-HER-2 therapy dedicated to her to population and we are left with HER-2 negative triple negative population.    Recommendation: Residual disease is a poor prognostic marker.  Because of this I recommended 4 cycles of dose dense Adriamycin and Cytoxan and hold off on doing Herceptin treatments until we are finished with Adriamycin and  Cytoxan.  We discussed the risks of additional chemotherapy.  Patient had a tough time with Taxotere and Cytoxan.  Explained to her that Adriamycin and Cytoxan is quite different in terms of tolerability.  We went over the risks and benefits of the treatment. Plan to start chemotherapy 12/16/2017.  I send this message to Dr. Donne Hazel and Dr. Iran Planas  Orders Placed This Encounter  Procedures  . CBC with Differential (Cancer Center Only)    Standing Status:   Standing    Number of Occurrences:   20    Standing Expiration Date:   11/26/2018  . CMP ([place] only)    Standing Status:   Standing    Number of Occurrences:   20    Standing Expiration Date:   11/26/2018  . PHYSICIAN COMMUNICATION ORDER    A baseline Echo/ [place] should be obtained  prior to initiation of Anthracycline Chemotherapy   The patient has a good understanding of the overall plan. she agrees with it. she will call with any problems that may develop before the next visit here.   Harriette Ohara, MD 11/25/17

## 2017-11-25 NOTE — Assessment & Plan Note (Signed)
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 Neoadjuvant chemotherapy with TCH Perjeta 6 cycles followed by Herceptinand Perjetamaintenance for 1 year 2. Followed bybilateral mastectomies with targeted node dissection on the right 3. Followed by adjuvant radiation therapy ----------------------------------------------------------------------------- 11/18/2017:Bilateral mastectomies: Right mastectomy: IDC grade 3 3.8 cm margins negative, 0/4 lymph nodes negative, ER 0%, PR 0%, HER-2 negative ratio 1.3, IHC HER-2 negative; Ki-67 70%, RCB class II; left mastectomy: [facility]  I discussed the surgical pathology report with the patient and provided her with a copy of this report.  It appears that the tumor is now HER-2 negative.  It is possible that the anti-HER-2 therapy dedicated to her to population and we are left with HER-2 negative triple negative population.    Recommendation: Residual disease is a poor prognostic marker.  Because of this I recommended 4 cycles of dose dense Adriamycin and Cytoxan and hold off on doing Herceptin treatments until we are finished with Adriamycin and Cytoxan.  We discussed the risks of additional chemotherapy.  Patient had a tough time with Taxotere and Cytoxan.  Explained to her that Adriamycin and Cytoxan is quite different in terms of tolerability.  We went over the risks and benefits of the treatment.

## 2017-11-25 NOTE — Telephone Encounter (Signed)
Patient scheduled per 7/30 los. Patient scheduled per avail of treatment area.

## 2017-11-25 NOTE — Progress Notes (Signed)
DISCONTINUE ON PATHWAY REGIMEN - Breast     A cycle is every 21 days:     Pertuzumab      Pertuzumab      Trastuzumab      Trastuzumab      Carboplatin      Docetaxel   **Always confirm dose/schedule in your pharmacy ordering system**  REASON: Continuation Of Treatment PRIOR TREATMENT: [id number]: Docetaxel + Carboplatin + Trastuzumab + Pertuzumab (TCHP) q21 Days x 6 Cycles TREATMENT RESPONSE: Partial Response (PR)  START OFF PATHWAY REGIMEN - Breast   [id number]:Dose-Dense AC q14 days:   A cycle is every 14 days:     Doxorubicin      Cyclophosphamide      Pegfilgrastim-xxxx   **Always confirm dose/schedule in your pharmacy ordering system**  Patient Characteristics: Post-Neoadjuvant Therapy and Resection, HER2 Negative/Unknown/Equivocal, ER Negative/Unknown, Residual Disease, Adjuvant Therapy - 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

## 2017-12-01 ENCOUNTER — Encounter: Payer: Self-pay | Admitting: *Deleted

## 2017-12-02 ENCOUNTER — Other Ambulatory Visit: Payer: Self-pay | Admitting: *Deleted

## 2017-12-02 ENCOUNTER — Telehealth: Payer: Self-pay | Admitting: Hematology and Oncology

## 2017-12-02 MED ORDER — LORAZEPAM 1 MG PO TABS: 1.0000 mg | ORAL_TABLET | Freq: Three times a day (TID) | ORAL | 0 refills | Status: DC | PRN

## 2017-12-02 NOTE — Telephone Encounter (Signed)
Spoke to patient regarding upcoming aug appt updates per 8/5 sch message

## 2017-12-09 ENCOUNTER — Other Ambulatory Visit: Payer: Self-pay

## 2017-12-09 DIAGNOSIS — J069 Acute upper respiratory infection, unspecified: Secondary | ICD-10-CM

## 2017-12-09 MED ORDER — AZITHROMYCIN 250 MG PO TABS: 250.0000 mg | ORAL_TABLET | Freq: Every day | ORAL | Status: DC

## 2017-12-09 MED ORDER — AZITHROMYCIN 250 MG PO TABS: ORAL_TABLET | ORAL | 0 refills | Status: DC

## 2017-12-09 MED ORDER — AZITHROMYCIN 250 MG PO TABS: 500.0000 mg | ORAL_TABLET | Freq: Every day | ORAL | Status: DC

## 2017-12-09 MED ORDER — GABAPENTIN 300 MG PO CAPS: 300.0000 mg | ORAL_CAPSULE | Freq: Three times a day (TID) | ORAL | 1 refills | Status: DC

## 2017-12-09 NOTE — Progress Notes (Signed)
Pt called in to request refill for her gabapentin. Pt has sharp shooting pain to her axilla area and also numbness in her toes from chemo. Pt also started having symptoms of chest cold. She will be starting cycle 1 of AC next week. Obtained antibiotic zpak from [facility] and ok to refill gabapenting 300mg  TID. Confirmed time/date of appt next week for chemo. No further needs at this time.

## 2017-12-16 ENCOUNTER — Ambulatory Visit: Payer: BLUE CROSS/BLUE SHIELD

## 2017-12-16 ENCOUNTER — Other Ambulatory Visit: Payer: BLUE CROSS/BLUE SHIELD

## 2017-12-16 ENCOUNTER — Ambulatory Visit: Payer: BLUE CROSS/BLUE SHIELD | Admitting: Hematology and Oncology

## 2017-12-17 ENCOUNTER — Other Ambulatory Visit: Payer: Self-pay | Admitting: Hematology and Oncology

## 2017-12-18 ENCOUNTER — Inpatient Hospital Stay (HOSPITAL_BASED_OUTPATIENT_CLINIC_OR_DEPARTMENT_OTHER): Payer: BLUE CROSS/BLUE SHIELD | Admitting: Hematology and Oncology

## 2017-12-18 ENCOUNTER — Inpatient Hospital Stay: Payer: BLUE CROSS/BLUE SHIELD

## 2017-12-18 ENCOUNTER — Ambulatory Visit (HOSPITAL_COMMUNITY)
Admission: RE | Admit: 2017-12-18 | Discharge: 2017-12-18 | Disposition: A | Discharge status: Home / Self Care | Payer: BLUE CROSS/BLUE SHIELD | Source: Ambulatory Visit | Attending: Internal Medicine | Admitting: Internal Medicine

## 2017-12-18 ENCOUNTER — Encounter: Payer: Self-pay | Admitting: *Deleted

## 2017-12-18 ENCOUNTER — Inpatient Hospital Stay: Payer: BLUE CROSS/BLUE SHIELD | Attending: Hematology and Oncology

## 2017-12-18 VITALS — BP 129/93

## 2017-12-18 DIAGNOSIS — Z171 Estrogen receptor negative status [ER-]: Secondary | ICD-10-CM

## 2017-12-18 DIAGNOSIS — Z9013 Acquired absence of bilateral breasts and nipples: Secondary | ICD-10-CM

## 2017-12-18 DIAGNOSIS — R252 Cramp and spasm: Secondary | ICD-10-CM | POA: Diagnosis not present

## 2017-12-18 DIAGNOSIS — Z5111 Encounter for antineoplastic chemotherapy: Secondary | ICD-10-CM | POA: Insufficient documentation

## 2017-12-18 DIAGNOSIS — C50411 Malignant neoplasm of upper-outer quadrant of right female breast: Secondary | ICD-10-CM | POA: Insufficient documentation

## 2017-12-18 DIAGNOSIS — Z79899 Other long term (current) drug therapy: Secondary | ICD-10-CM

## 2017-12-18 LAB — CBC WITH DIFFERENTIAL (CANCER CENTER ONLY)
BASOS ABS: 0 10*3/uL (ref 0.0–0.1)
Basophils Relative: 1 %
Eosinophils Absolute: 0.3 10*3/uL (ref 0.0–0.5)
Eosinophils Relative: 6 %
HEMATOCRIT: 34.3 % — AB (ref 34.8–46.6)
HEMOGLOBIN: 10.7 g/dL — AB (ref 11.6–15.9)
LYMPHS PCT: 28 %
Lymphs Abs: 1.5 10*3/uL (ref 0.9–3.3)
MCH: 29.4 pg (ref 25.1–34.0)
MCHC: 31.2 g/dL — ABNORMAL LOW (ref 31.5–36.0)
MCV: 94.2 fL (ref 79.5–101.0)
Monocytes Absolute: 0.3 10*3/uL (ref 0.1–0.9)
Monocytes Relative: 6 %
NEUTROS ABS: 3.3 10*3/uL (ref 1.5–6.5)
Neutrophils Relative %: 59 %
Platelet Count: 279 10*3/uL (ref 145–400)
RBC: 3.64 MIL/uL — AB (ref 3.70–5.45)
RDW: 15.9 % — ABNORMAL HIGH (ref 11.2–14.5)
WBC: 5.5 10*3/uL (ref 3.9–10.3)

## 2017-12-18 LAB — CMP (CANCER CENTER ONLY)
ALBUMIN: 3.8 g/dL (ref 3.5–5.0)
ALT: 13 U/L (ref 0–44)
ANION GAP: 8 (ref 5–15)
AST: 14 U/L — ABNORMAL LOW (ref 15–41)
Alkaline Phosphatase: 67 U/L (ref 38–126)
BUN: 16 mg/dL (ref 6–20)
CO2: 26 mmol/L (ref 22–32)
Calcium: 9.2 mg/dL (ref 8.9–10.3)
Chloride: 106 mmol/L (ref 98–111)
Creatinine: 0.85 mg/dL (ref 0.44–1.00)
GFR, Est AFR Am: >60 mL/min (ref >60)
GLUCOSE: 106 mg/dL — AB (ref 70–99)
Potassium: 4.4 mmol/L (ref 3.5–5.1)
Sodium: 140 mmol/L (ref 135–145)
TOTAL PROTEIN: 6.6 g/dL (ref 6.5–8.1)

## 2017-12-18 LAB — ECHOCARDIOGRAM COMPLETE
HEIGHTINCHES: 70 in
Weight: 3609.6 oz

## 2017-12-18 MED ORDER — SODIUM CHLORIDE 0.9% FLUSH
10.0000 mL | INTRAVENOUS | Status: DC | PRN
  Filled 2017-12-18: qty 10

## 2017-12-18 MED ORDER — SODIUM CHLORIDE 0.9 % IV SOLN
Freq: Once | INTRAVENOUS | Status: AC
  Administered 2017-12-18: 10:00:00 via INTRAVENOUS
  Filled 2017-12-18: qty 5

## 2017-12-18 MED ORDER — SODIUM CHLORIDE 0.9 % IV SOLN
500.0000 mg/m2 | Freq: Once | INTRAVENOUS | Status: AC
  Administered 2017-12-18: 1120 mg via INTRAVENOUS
  Filled 2017-12-18: qty 56

## 2017-12-18 MED ORDER — PALONOSETRON HCL INJECTION 0.25 MG/5ML
0.2500 mg | Freq: Once | INTRAVENOUS | Status: AC
  Administered 2017-12-18: 0.25 mg via INTRAVENOUS

## 2017-12-18 MED ORDER — HEPARIN SOD (PORK) LOCK FLUSH 100 UNIT/ML IV SOLN
500.0000 [IU] | Freq: Once | INTRAVENOUS | Status: DC | PRN
  Filled 2017-12-18: qty 5

## 2017-12-18 MED ORDER — DOXORUBICIN HCL CHEMO IV INJECTION 2 MG/ML
50.0000 mg/m2 | Freq: Once | INTRAVENOUS | Status: AC
  Administered 2017-12-18: 112 mg via INTRAVENOUS
  Filled 2017-12-18: qty 56

## 2017-12-18 MED ORDER — SODIUM CHLORIDE 0.9 % IV SOLN
Freq: Once | INTRAVENOUS | Status: AC
  Administered 2017-12-18: 10:00:00 via INTRAVENOUS
  Filled 2017-12-18: qty 250

## 2017-12-18 MED ORDER — PALONOSETRON HCL INJECTION 0.25 MG/5ML
INTRAVENOUS | Status: AC
  Filled 2017-12-18: qty 5

## 2017-12-18 MED ORDER — LORAZEPAM 2 MG/ML IJ SOLN
INTRAMUSCULAR | Status: AC
  Filled 2017-12-18: qty 1

## 2017-12-18 MED ORDER — LORAZEPAM 2 MG/ML IJ SOLN
1.0000 mg | Freq: Once | INTRAMUSCULAR | Status: AC
  Administered 2017-12-18: 1 mg via INTRAVENOUS

## 2017-12-18 NOTE — Patient Instructions (Signed)
[facility] Discharge Instructions for Patients Receiving Chemotherapy  Today you received the following chemotherapy agents: Adriamycin, Cytoxan  To help prevent nausea and vomiting after your treatment, we encourage you to take your nausea medication as directed.   If you develop nausea and vomiting that is not controlled by your nausea medication, call the clinic.   BELOW ARE SYMPTOMS THAT SHOULD BE REPORTED IMMEDIATELY:  *FEVER GREATER THAN 100.5 F  *CHILLS WITH OR WITHOUT FEVER  NAUSEA AND VOMITING THAT IS NOT CONTROLLED WITH YOUR NAUSEA MEDICATION  *UNUSUAL SHORTNESS OF BREATH  *UNUSUAL BRUISING OR BLEEDING  TENDERNESS IN MOUTH AND THROAT WITH OR WITHOUT PRESENCE OF ULCERS  *URINARY PROBLEMS  *BOWEL PROBLEMS  UNUSUAL RASH Items with * indicate a potential emergency and should be followed up as soon as possible.  Feel free to call the clinic should you have any questions or concerns. The clinic phone number is [phone].  Please show the [facility] at check-in to the Emergency Department and triage nurse.  Doxorubicin injection What is this medicine? DOXORUBICIN (dox oh ROO bi sin) is a chemotherapy drug. It is used to treat many kinds of cancer like leukemia, lymphoma, neuroblastoma, sarcoma, and Wilms' tumor. It is also used to treat bladder cancer, breast cancer, lung cancer, ovarian cancer, stomach cancer, and thyroid cancer. This medicine may be used for other purposes; ask your health care provider or pharmacist if you have questions. COMMON BRAND NAME(S): Adriamycin, Adriamycin PFS, Adriamycin RDF, Rubex What should I tell my health care provider before I take this medicine? They need to know if you have any of these conditions: -heart disease -history of low blood counts caused by a medicine -liver disease -recent or ongoing radiation therapy -an unusual or allergic reaction to doxorubicin, other chemotherapy agents, other medicines,  foods, dyes, or preservatives -pregnant or trying to get pregnant -breast-feeding How should I use this medicine? This drug is given as an infusion into a vein. It is administered in a hospital or clinic by a specially trained health care professional. If you have pain, swelling, burning or any unusual feeling around the site of your injection, tell your health care professional right away. Talk to your pediatrician regarding the use of this medicine in children. Special care may be needed. Overdosage: If you think you have taken too much of this medicine contact a poison control center or emergency room at once. NOTE: This medicine is only for you. Do not share this medicine with others. What if I miss a dose? It is important not to miss your dose. Call your doctor or health care professional if you are unable to keep an appointment. What may interact with this medicine? This medicine may interact with the following medications: -6-mercaptopurine -paclitaxel -phenytoin -St. John's Wort -trastuzumab -verapamil This list may not describe all possible interactions. Give your health care provider a list of all the medicines, herbs, non-prescription drugs, or dietary supplements you use. Also tell them if you smoke, drink alcohol, or use illegal drugs. Some items may interact with your medicine. What should I watch for while using this medicine? This drug may make you feel generally unwell. This is not uncommon, as chemotherapy can affect healthy cells as well as cancer cells. Report any side effects. Continue your course of treatment even though you feel ill unless your doctor tells you to stop. There is a maximum amount of this medicine you should receive throughout your life. The amount depends on the  medical condition being treated and your overall health. Your doctor will watch how much of this medicine you receive in your lifetime. Tell your doctor if you have taken this medicine before. You  may need blood work done while you are taking this medicine. Your urine may turn red for a few days after your dose. This is not blood. If your urine is dark or brown, call your doctor. In some cases, you may be given additional medicines to help with side effects. Follow all directions for their use. Call your doctor or health care professional for advice if you get a fever, chills or sore throat, or other symptoms of a cold or flu. Do not treat yourself. This drug decreases your body's ability to fight infections. Try to avoid being around people who are sick. This medicine may increase your risk to bruise or bleed. Call your doctor or health care professional if you notice any unusual bleeding. Talk to your doctor about your risk of cancer. You may be more at risk for certain types of cancers if you take this medicine. Do not become pregnant while taking this medicine or for 6 months after stopping it. Women should inform their doctor if they wish to become pregnant or think they might be pregnant. Men should not father a child while taking this medicine and for 6 months after stopping it. There is a potential for serious side effects to an unborn child. Talk to your health care professional or pharmacist for more information. Do not breast-feed an infant while taking this medicine. This medicine has caused ovarian failure in some women and reduced sperm counts in some men This medicine may interfere with the ability to have a child. Talk with your doctor or health care professional if you are concerned about your fertility. What side effects may I notice from receiving this medicine? Side effects that you should report to your doctor or health care professional as soon as possible: -allergic reactions like skin rash, itching or hives, swelling of the face, lips, or tongue -breathing problems -chest pain -fast or irregular heartbeat -low blood counts - this medicine may decrease the number of white  blood cells, red blood cells and platelets. You may be at increased risk for infections and bleeding. -pain, redness, or irritation at site where injected -signs of infection - fever or chills, cough, sore throat, pain or difficulty passing urine -signs of decreased platelets or bleeding - bruising, pinpoint red spots on the skin, black, tarry stools, blood in the urine -swelling of the ankles, feet, hands -tiredness -weakness Side effects that usually do not require medical attention (report to your doctor or health care professional if they continue or are bothersome): -diarrhea -hair loss -mouth sores -nail discoloration or damage -nausea -red colored urine -vomiting This list may not describe all possible side effects. Call your doctor for medical advice about side effects. You may report side effects to FDA at 1-800-FDA-1088. Where should I keep my medicine? This drug is given in a hospital or clinic and will not be stored at home. NOTE: This sheet is a summary. It may not cover all possible information. If you have questions about this medicine, talk to your doctor, pharmacist, or health care provider.  2018 Elsevier/Gold Standard (2015-06-12 11:28:51)  Cyclophosphamide injection What is this medicine? CYCLOPHOSPHAMIDE (sye kloe FOSS fa mide) is a chemotherapy drug. It slows the growth of cancer cells. This medicine is used to treat many types of cancer like lymphoma, myeloma,  leukemia, breast cancer, and ovarian cancer, to name a few. This medicine may be used for other purposes; ask your health care provider or pharmacist if you have questions. COMMON BRAND NAME(S): Cytoxan, Neosar What should I tell my health care provider before I take this medicine? They need to know if you have any of these conditions: -blood disorders -history of other chemotherapy -infection -kidney disease -liver disease -recent or ongoing radiation therapy -tumors in the bone marrow -an unusual or  allergic reaction to cyclophosphamide, other chemotherapy, other medicines, foods, dyes, or preservatives -pregnant or trying to get pregnant -breast-feeding How should I use this medicine? This drug is usually given as an injection into a vein or muscle or by infusion into a vein. It is administered in a hospital or clinic by a specially trained health care professional. Talk to your pediatrician regarding the use of this medicine in children. Special care may be needed. Overdosage: If you think you have taken too much of this medicine contact a poison control center or emergency room at once. NOTE: This medicine is only for you. Do not share this medicine with others. What if I miss a dose? It is important not to miss your dose. Call your doctor or health care professional if you are unable to keep an appointment. What may interact with this medicine? This medicine may interact with the following medications: -amiodarone -amphotericin B -azathioprine -certain antiviral medicines for HIV or AIDS such as protease inhibitors (e.g., indinavir, ritonavir) and zidovudine -certain blood pressure medications such as benazepril, captopril, enalapril, fosinopril, lisinopril, moexipril, monopril, perindopril, quinapril, ramipril, trandolapril -certain cancer medications such as anthracyclines (e.g., daunorubicin, doxorubicin), busulfan, cytarabine, paclitaxel, pentostatin, tamoxifen, trastuzumab -certain diuretics such as chlorothiazide, chlorthalidone, hydrochlorothiazide, indapamide, metolazone -certain medicines that treat or prevent blood clots like warfarin -certain muscle relaxants such as succinylcholine -cyclosporine -etanercept -indomethacin -medicines to increase blood counts like filgrastim, pegfilgrastim, sargramostim -medicines used as general anesthesia -metronidazole -natalizumab This list may not describe all possible interactions. Give your health care provider a list of all the  medicines, herbs, non-prescription drugs, or dietary supplements you use. Also tell them if you smoke, drink alcohol, or use illegal drugs. Some items may interact with your medicine. What should I watch for while using this medicine? Visit your doctor for checks on your progress. This drug may make you feel generally unwell. This is not uncommon, as chemotherapy can affect healthy cells as well as cancer cells. Report any side effects. Continue your course of treatment even though you feel ill unless your doctor tells you to stop. Drink water or other fluids as directed. Urinate often, even at night. In some cases, you may be given additional medicines to help with side effects. Follow all directions for their use. Call your doctor or health care professional for advice if you get a fever, chills or sore throat, or other symptoms of a cold or flu. Do not treat yourself. This drug decreases your body's ability to fight infections. Try to avoid being around people who are sick. This medicine may increase your risk to bruise or bleed. Call your doctor or health care professional if you notice any unusual bleeding. Be careful brushing and flossing your teeth or using a toothpick because you may get an infection or bleed more easily. If you have any dental work done, tell your dentist you are receiving this medicine. You may get drowsy or dizzy. Do not drive, use machinery, or do anything that needs mental alertness until  you know how this medicine affects you. Do not become pregnant while taking this medicine or for 1 year after stopping it. Women should inform their doctor if they wish to become pregnant or think they might be pregnant. Men should not father a child while taking this medicine and for 4 months after stopping it. There is a potential for serious side effects to an unborn child. Talk to your health care professional or pharmacist for more information. Do not breast-feed an infant while taking  this medicine. This medicine may interfere with the ability to have a child. This medicine has caused ovarian failure in some women. This medicine has caused reduced sperm counts in some men. You should talk with your doctor or health care professional if you are concerned about your fertility. If you are going to have surgery, tell your doctor or health care professional that you have taken this medicine. What side effects may I notice from receiving this medicine? Side effects that you should report to your doctor or health care professional as soon as possible: -allergic reactions like skin rash, itching or hives, swelling of the face, lips, or tongue -low blood counts - this medicine may decrease the number of white blood cells, red blood cells and platelets. You may be at increased risk for infections and bleeding. -signs of infection - fever or chills, cough, sore throat, pain or difficulty passing urine -signs of decreased platelets or bleeding - bruising, pinpoint red spots on the skin, black, tarry stools, blood in the urine -signs of decreased red blood cells - unusually weak or tired, fainting spells, lightheadedness -breathing problems -dark urine -dizziness -palpitations -swelling of the ankles, feet, hands -trouble passing urine or change in the amount of urine -weight gain -yellowing of the eyes or skin Side effects that usually do not require medical attention (report to your doctor or health care professional if they continue or are bothersome): -changes in nail or skin color -hair loss -missed menstrual periods -mouth sores -nausea, vomiting This list may not describe all possible side effects. Call your doctor for medical advice about side effects. You may report side effects to FDA at 1-800-FDA-1088. Where should I keep my medicine? This drug is given in a hospital or clinic and will not be stored at home. NOTE: This sheet is a summary. It may not cover all possible  information. If you have questions about this medicine, talk to your doctor, pharmacist, or health care provider.  2018 [employer]vier/Gold Standard (2012-02-28 16:22:58)

## 2017-12-18 NOTE — Progress Notes (Signed)
Patient Care Team: Deland Pretty, MD as PCP - General (Internal Medicine) Baxley, Cresenciano Lick, MD (Internal Medicine)  DIAGNOSIS:  Encounter Diagnosis  Name Primary?  . Malignant neoplasm of upper-outer quadrant of right breast in female, estrogen receptor negative ([place])     SUMMARY OF ONCOLOGIC HISTORY:   Malignant neoplasm of upper-outer quadrant of right breast in female, estrogen receptor negative ([place])   07/09/2017 Initial Diagnosis    Patient palpated a right breast mass which was evaluated by mammogram and ultrasound and a breast MRI which revealed 4.5 x 4 cm necrotic tumor with suspicious multiple right axillary lymph nodes; biopsy revealed IDC grade 3 ER 0%, PR 0%, HER-2 positive ratio 3.08, Ki-67 70%, T2NX stage IIa/IIb    07/18/2017 - 11/04/2017 Neo-Adjuvant Chemotherapy    TCH Perjeta x6 cycles followed by Herceptin Perjeta maintenance    08/15/2017 Genetic Testing    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  The Multi-Gene Panel offered by [facility] includes sequencing and/or deletion duplication testing of the following 83 genes: ALK, APC, ATM, AXIN2,BAP1,  BARD1, BLM, BMPR1A, BRCA1, BRCA2, BRIP1, CASR, CDC73, CDH1, CDK4, CDKN1B, CDKN1C, CDKN2A (p14ARF), CDKN2A (p16INK4a), CEBPA, CHEK2, CTNNA1, DICER1, DIS3L2, EGFR (c.2369C>T, p.Thr790Met variant only), EPCAM (Deletion/duplication testing only), FH, FLCN, GATA2, GPC3, GREM1 (Promoter region deletion/duplication testing only), HOXB13 (c.251G>A, p.Gly84Glu), HRAS, KIT, MAX, MEN1, MET, MITF (c.952G>A, p.Glu318Lys variant only), MLH1, MSH2, MSH3, MSH6, MUTYH, NBN, NF1, NF2, NTHL1, PALB2, PDGFRA, PHOX2B, PMS2, POLD1, POLE, POT1, PRKAR1A, PTCH1, PTEN, RAD50, RAD51C, RAD51D, RB1, RECQL4, RET, RUNX1, SDHAF2, SDHA (sequence changes only), SDHB, SDHC, SDHD, SMAD4, SMARCA4, SMARCB1, SMARCE1, STK11, SUFU, TERT, TERT, TMEM127, TP53, TSC1, TSC2, VHL, WRN and WT1.  The report date is August 15, 2017.    11/18/2017 Surgery    Bilateral mastectomies: Right mastectomy: IDC grade 3 3.8 cm margins negative, 0/4 lymph nodes negative, ER 0%, PR 0%, HER-2 negative ratio 1.3, IHC HER-2 negative; Ki-67 70%, RCB class II; left mastectomy: [facility]    12/18/2017 -  Chemotherapy    Adjuvant chemotherapy with dose dense Adriamycin and Cytoxan x4     CHIEF COMPLIANT: Cycle 1 day 1 adjuvant dose dense Adriamycin and Cytoxan  INTERVAL HISTORY: FORREST DEMURO is a 78-year with above-mentioned history of HER-2 positive breast cancer who underwent neoadjuvant chemotherapy and still had significant residual disease.  Because of this we decided to administer 4 cycles of dose dense Adriamycin and Cytoxan and she is here today to receive her first cycle of her treatment.  She has healed and recover from the bilateral mastectomies.  REVIEW OF SYSTEMS:   Constitutional: Denies fevers, chills or abnormal weight loss Eyes: Denies blurriness of vision Ears, nose, mouth, throat, and face: Denies mucositis or sore throat Respiratory: Denies cough, dyspnea or wheezes Cardiovascular: Denies palpitation, chest discomfort Gastrointestinal:  Denies nausea, heartburn or change in bowel habits Skin: Denies abnormal skin rashes Lymphatics: Denies new lymphadenopathy or easy bruising Neurological:Denies numbness, tingling or new weaknesses Behavioral/Psych: Mood is stable, no new changes  Extremities: No lower extremity edema Breast: Bilateral mastectomies All other systems were reviewed with the patient and are negative.  I have reviewed the past medical history, past surgical history, social history and family history with the patient and they are unchanged from previous note.  ALLERGIES:  has no allergies on file.  MEDICATIONS:  Current Outpatient Medications  Medication Sig Dispense Refill  . carvedilol (COREG) 3.125 MG tablet Take 1 tablet (3.125 mg total) by mouth 2 (two) times daily.  60 tablet 3  . esomeprazole (NEXIUM) 40 MG capsule  Nexium 40 mg capsule,delayed release  Take 1 capsule every day by oral route.    . gabapentin (NEURONTIN) 300 MG capsule Take 1 capsule (300 mg total) by mouth 3 (three) times daily. 90 capsule 1  . levothyroxine (SYNTHROID, LEVOTHROID) 137 MCG tablet Take 137 mcg by mouth daily before breakfast.    . lidocaine-prilocaine (EMLA) cream Apply to affected area once 30 g 3  . LORazepam (ATIVAN) 1 MG tablet Take 1 tablet (1 mg total) by mouth every 8 (eight) hours as needed (nausea and vomiting). 30 tablet 0  . losartan (COZAAR) 25 MG tablet Take 1 tablet (25 mg total) by mouth daily. 30 tablet 3  . metFORMIN (GLUCOPHAGE) 500 MG tablet Take by mouth daily with breakfast.    . methocarbamol (ROBAXIN) 500 MG tablet Take 1 tablet (500 mg total) by mouth every 8 (eight) hours as needed for muscle spasms. 30 tablet 0  . ondansetron (ZOFRAN) 8 MG tablet Take 1 tablet (8 mg total) by mouth 2 (two) times daily as needed. Start on the third day after chemotherapy. 30 tablet 1  . oxyCODONE (OXY IR/ROXICODONE) 5 MG immediate release tablet Take 1-2 tablets (5-10 mg total) by mouth every 4 (four) hours as needed for moderate pain. 40 tablet 0  . prochlorperazine (COMPAZINE) 10 MG tablet Take 1 tablet (10 mg total) by mouth every 6 (six) hours as needed (Nausea or vomiting). 30 tablet 1  . triamterene-hydrochlorothiazide (MAXZIDE) 75-50 MG tablet One po daily prn lower extremity edema 30 tablet 2   No current facility-administered medications for this visit.    Facility-Administered Medications Ordered in Other Visits  Medication Dose Route Frequency Provider Last Rate Last Dose  . cyclophosphamide (CYTOXAN) 1,120 mg in sodium chloride 0.9 % 250 mL chemo infusion  500 mg/m2 (Treatment Plan Recorded) Intravenous Once Nicholas Lose, MD      . DOXOrubicin (ADRIAMYCIN) chemo injection 112 mg  50 mg/m2 (Treatment Plan Recorded) Intravenous Once Nicholas Lose, MD      . fosaprepitant (EMEND) 150 mg, dexamethasone  (DECADRON) 12 mg in sodium chloride 0.9 % 145 mL IVPB   Intravenous Once Nicholas Lose, MD 454 mL/hr at 12/18/17 1015    . heparin lock flush 100 unit/mL  500 Units Intracatheter Once PRN Nicholas Lose, MD      . LORazepam (ATIVAN) injection 1 mg  1 mg Intravenous Once Sandi Mealy E., PA-C      . sodium chloride flush (NS) 0.9 % injection 10 mL  10 mL Intracatheter PRN Nicholas Lose, MD        PHYSICAL EXAMINATION: ECOG PERFORMANCE STATUS: 1 - Symptomatic but completely ambulatory  Vitals:   12/18/17 0857  BP: (!) 148/105  Pulse: 76  Resp: 17  Temp: 98.6 F (37 C)  SpO2: 98%   Filed Weights   12/18/17 0857  Weight: 225 lb 9.6 oz (102.3 kg)    GENERAL:alert, no distress and comfortable SKIN: skin color, texture, turgor are normal, no rashes or significant lesions EYES: normal, Conjunctiva are pink and non-injected, sclera clear OROPHARYNX:no exudate, no erythema and lips, buccal mucosa, and tongue normal  NECK: supple, thyroid normal size, non-tender, without nodularity LYMPH:  no palpable lymphadenopathy in the cervical, axillary or inguinal LUNGS: clear to auscultation and percussion with normal breathing effort HEART: regular rate & rhythm and no murmurs and no lower extremity edema ABDOMEN:abdomen soft, non-tender and normal bowel sounds MUSCULOSKELETAL:no cyanosis of digits and  no clubbing  NEURO: alert & oriented x 3 with fluent speech, no focal motor/sensory deficits EXTREMITIES: No lower extremity edema   LABORATORY DATA:  I have reviewed the data as listed CMP Latest Ref Rng & Units 12/18/2017 11/04/2017 10/14/2017  Glucose 70 - 99 mg/dL 106(H) 110(H) 99  BUN 6 - 20 mg/dL _0 Creatinine 0.44 - 1.00 mg/dL 0.85 0.90 0.95  Sodium 135 - 145 mmol/L 140 140 142  Potassium 3.5 - 5.1 mmol/L 4.4 4.0 3.9  Chloride 98 - 111 mmol/L 106 104 105  CO2 22 - 32 mmol/L _1 Calcium 8.9 - 10.3 mg/dL 9.2 9.6 9.5  Total Protein 6.5 - 8.1 g/dL 6.6 7.1 6.7  Total Bilirubin 0.3  - 1.2 mg/dL <0.2(L) 0.3 0.2  Alkaline Phos 38 - 126 U/L 67 64 55  AST 15 - 41 U/L 14(L) 15 17  ALT 0 - 44 U/L _2 Lab Results  Component Value Date   WBC 5.5 12/18/2017   HGB 10.7 (L) 12/18/2017   HCT 34.3 (L) 12/18/2017   MCV 94.2 12/18/2017   PLT 279 12/18/2017   NEUTROABS 3.3 12/18/2017    ASSESSMENT & PLAN:  Malignant neoplasm of upper-outer quadrant of right breast in female, estrogen receptor negative ([place]) 07/09/2017:Patient palpated a right breast mass which was evaluated by mammogram and ultrasound and a breast MRI which revealed 4.5 x 4 cm necrotic tumor with suspicious multiple right axillary lymph nodes; biopsy revealed IDC grade 3 ER 0%, PR 0%, HER-2 positive ratio 3.08, Ki-67 70%, T2NX stage IIa/IIb  Treatment planbased on multidisciplinary tumor board: 1. Neoadjuvant chemotherapy with TCH Perjeta 6 cycles followed by Herceptinand Perjetamaintenance for 1 year 2. Followed bybilateral mastectomies with targeted node dissection on the right7/23/2019 3.  Due to significant residual disease, additional adjuvant chemo with Adriamycin and Cytoxan starting 12/18/2017 4. Followed by adjuvant radiation therapy ----------------------------------------------------------------------------- 11/18/2017:Bilateral mastectomies: Right mastectomy: IDC grade 3 3.8 cm margins negative, 0/4 lymph nodes negative, ER 0%, PR 0%, HER-2 negative ratio 1.3, IHC HER-2 negative; Ki-67 70%, RCB class II; left mastectomy: [facility]  Current treatment: Cycle 1 day 1 dose dense Adriamycin and Cytoxan Antiemetics were reviewed Labs were reviewed Patient had an echocardiogram and cardiology follow-up in June.  She has left bundle branch block and I discussed her case with Dr. Haroldine Laws who felt that she could receive Adriamycin without any additional testing at this time.  Herceptin and Perjeta will be on hold until Adriamycin and Cytoxan are complete.  Return to clinic in 1 week for toxicity  check  No orders of the defined types were placed in this encounter.  The patient has a good understanding of the overall plan. she agrees with it. she will call with any problems that may develop before the next visit here.   Harriette Ohara, MD 12/18/17

## 2017-12-18 NOTE — Assessment & Plan Note (Signed)
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 Neoadjuvant chemotherapy with TCH Perjeta 6 cycles followed by Herceptinand Perjetamaintenance for 1 year 2. Followed bybilateral mastectomies with targeted node dissection on the right7/23/2019 3.  Due to significant residual disease, additional adjuvant chemo with Adriamycin and Cytoxan starting 12/18/2017 4. Followed by adjuvant radiation therapy ----------------------------------------------------------------------------- 11/18/2017:Bilateral mastectomies: Right mastectomy: IDC grade 3 3.8 cm margins negative, 0/4 lymph nodes negative, ER 0%, PR 0%, HER-2 negative ratio 1.3, IHC HER-2 negative; Ki-67 70%, RCB class II; left mastectomy: [facility]  Current treatment: Cycle 1 day 1 dose dense Adriamycin and Cytoxan Antiemetics were reviewed Labs were reviewed Patient had an echocardiogram and cardiology follow-up in June.  She has left bundle branch block and I discussed her case with Dr. Haroldine Laws who felt that she could receive Adriamycin without any additional testing at this time.  Herceptin and Perjeta will be on hold until Adriamycin and Cytoxan are complete.  Return to clinic in 1 week for toxicity check

## 2017-12-18 NOTE — Patient Instructions (Signed)
Implanted Port Home Guide An implanted port is a type of central line that is placed under the skin. Central lines are used to provide IV access when treatment or nutrition needs to be given through a person's veins. Implanted ports are used for long-term IV access. An implanted port may be placed because:  You need IV medicine that would be irritating to the small veins in your hands or arms.  You need long-term IV medicines, such as antibiotics.  You need IV nutrition for a long period.  You need frequent blood draws for lab tests.  You need dialysis.  Implanted ports are usually placed in the chest area, but they can also be placed in the upper arm, the abdomen, or the leg. An implanted port has two main parts:  Reservoir. The reservoir is round and will appear as a small, raised area under your skin. The reservoir is the part where a needle is inserted to give medicines or draw blood.  Catheter. The catheter is a thin, flexible tube that extends from the reservoir. The catheter is placed into a large vein. Medicine that is inserted into the reservoir goes into the catheter and then into the vein.  How will I care for my incision site? Do not get the incision site wet. Bathe or shower as directed by your health care provider. How is my port accessed? Special steps must be taken to access the port:  Before the port is accessed, a numbing cream can be placed on the skin. This helps numb the skin over the port site.  Your health care provider uses a sterile technique to access the port. ? Your health care provider must put on a mask and sterile gloves. ? The skin over your port is cleaned carefully with an antiseptic and allowed to dry. ? The port is gently pinched between sterile gloves, and a needle is inserted into the port.  Only "non-coring" port needles should be used to access the port. Once the port is accessed, a blood return should be checked. This helps ensure that the port  is in the vein and is not clogged.  If your port needs to remain accessed for a constant infusion, a clear (transparent) bandage will be placed over the needle site. The bandage and needle will need to be changed every week, or as directed by your health care provider.  Keep the bandage covering the needle clean and dry. Do not get it wet. Follow your health care provider's instructions on how to take a shower or bath while the port is accessed.  If your port does not need to stay accessed, no bandage is needed over the port.  What is flushing? Flushing helps keep the port from getting clogged. Follow your health care provider's instructions on how and when to flush the port. Ports are usually flushed with saline solution or a medicine called heparin. The need for flushing will depend on how the port is used.  If the port is used for intermittent medicines or blood draws, the port will need to be flushed: ? After medicines have been given. ? After blood has been drawn. ? As part of routine maintenance.  If a constant infusion is running, the port may not need to be flushed.  How long will my port stay implanted? The port can stay in for as long as your health care provider thinks it is needed. When it is time for the port to come out, surgery will be   done to remove it. The procedure is similar to the one performed when the port was put in. When should I seek immediate medical care? When you have an implanted port, you should seek immediate medical care if:  You notice a bad smell coming from the incision site.  You have swelling, redness, or drainage at the incision site.  You have more swelling or pain at the port site or the surrounding area.  You have a fever that is not controlled with medicine.  This information is not intended to replace advice given to you by your health care provider. Make sure you discuss any questions you have with your health care provider. Document  Released: 04/15/2005 Document Revised: 09/21/2015 Document Reviewed: 12/21/2012 Elsevier Interactive Patient Education  2017 [employer]  

## 2017-12-18 NOTE — Progress Notes (Signed)
  Echocardiogram 2D Echocardiogram has been performed.  Rebecca [name] 12/18/2017, 1:56 PM

## 2017-12-19 ENCOUNTER — Telehealth: Payer: Self-pay | Admitting: Hematology and Oncology

## 2017-12-19 NOTE — Telephone Encounter (Signed)
Per 8/22 los, patient's appts for 8/27 have already been change to 8/29.

## 2017-12-20 ENCOUNTER — Inpatient Hospital Stay: Payer: BLUE CROSS/BLUE SHIELD

## 2017-12-20 VITALS — BP 116/70 | HR 70 | Temp 98.0°F | Resp 18

## 2017-12-20 DIAGNOSIS — C50411 Malignant neoplasm of upper-outer quadrant of right female breast: Secondary | ICD-10-CM | POA: Diagnosis not present

## 2017-12-20 DIAGNOSIS — Z171 Estrogen receptor negative status [ER-]: Principal | ICD-10-CM

## 2017-12-20 MED ORDER — PEGFILGRASTIM-CBQV 6 MG/0.6ML ~~LOC~~ SOSY
PREFILLED_SYRINGE | SUBCUTANEOUS | Status: AC
  Filled 2017-12-20: qty 0.6

## 2017-12-20 MED ORDER — PEGFILGRASTIM-CBQV 6 MG/0.6ML ~~LOC~~ SOSY
6.0000 mg | PREFILLED_SYRINGE | Freq: Once | SUBCUTANEOUS | Status: AC
  Administered 2017-12-20: 6 mg via SUBCUTANEOUS

## 2017-12-22 ENCOUNTER — Telehealth (HOSPITAL_COMMUNITY): Payer: Self-pay | Admitting: *Deleted

## 2017-12-22 DIAGNOSIS — Z171 Estrogen receptor negative status [ER-]: Principal | ICD-10-CM

## 2017-12-22 DIAGNOSIS — C50411 Malignant neoplasm of upper-outer quadrant of right female breast: Secondary | ICD-10-CM

## 2017-12-22 NOTE — Telephone Encounter (Signed)
Per [place], pt needs final echo and appt the 2nd week of Oct as: Her final AC will be on 10/3 so she will not have had her post [facility] echo by her current appt.   sch pt for mid Oct.

## 2017-12-23 ENCOUNTER — Other Ambulatory Visit: Payer: BLUE CROSS/BLUE SHIELD

## 2017-12-23 ENCOUNTER — Ambulatory Visit: Payer: BLUE CROSS/BLUE SHIELD | Admitting: Hematology and Oncology

## 2017-12-24 ENCOUNTER — Telehealth: Payer: Self-pay | Admitting: Hematology and Oncology

## 2017-12-24 NOTE — Telephone Encounter (Signed)
R/s appts per 8/23 sch message - per patient did NOT wants appts move - readjusted all appts back to original date - patient agreed to stop by scheduling after visit tomorrow 8/29 .

## 2017-12-25 ENCOUNTER — Telehealth: Payer: Self-pay | Admitting: Hematology and Oncology

## 2017-12-25 ENCOUNTER — Encounter: Payer: Self-pay | Admitting: *Deleted

## 2017-12-25 ENCOUNTER — Inpatient Hospital Stay: Payer: BLUE CROSS/BLUE SHIELD

## 2017-12-25 ENCOUNTER — Inpatient Hospital Stay (HOSPITAL_BASED_OUTPATIENT_CLINIC_OR_DEPARTMENT_OTHER): Payer: BLUE CROSS/BLUE SHIELD | Admitting: Hematology and Oncology

## 2017-12-25 DIAGNOSIS — Z9013 Acquired absence of bilateral breasts and nipples: Secondary | ICD-10-CM | POA: Diagnosis not present

## 2017-12-25 DIAGNOSIS — R252 Cramp and spasm: Secondary | ICD-10-CM

## 2017-12-25 DIAGNOSIS — Z171 Estrogen receptor negative status [ER-]: Secondary | ICD-10-CM | POA: Diagnosis not present

## 2017-12-25 DIAGNOSIS — Z95828 Presence of other vascular implants and grafts: Secondary | ICD-10-CM

## 2017-12-25 DIAGNOSIS — C50411 Malignant neoplasm of upper-outer quadrant of right female breast: Secondary | ICD-10-CM

## 2017-12-25 DIAGNOSIS — Z79899 Other long term (current) drug therapy: Secondary | ICD-10-CM

## 2017-12-25 LAB — CMP (CANCER CENTER ONLY)
ALK PHOS: 90 U/L (ref 38–126)
ALT: 12 U/L (ref 0–44)
AST: 9 U/L — ABNORMAL LOW (ref 15–41)
Albumin: 3.8 g/dL (ref 3.5–5.0)
Anion gap: 10 (ref 5–15)
BILIRUBIN TOTAL: 0.3 mg/dL (ref 0.3–1.2)
BUN: 16 mg/dL (ref 6–20)
CALCIUM: 9.3 mg/dL (ref 8.9–10.3)
CO2: 26 mmol/L (ref 22–32)
CREATININE: 0.8 mg/dL (ref 0.44–1.00)
Chloride: 104 mmol/L (ref 98–111)
Glucose, Bld: 113 mg/dL — ABNORMAL HIGH (ref 70–99)
Potassium: 4.2 mmol/L (ref 3.5–5.1)
Sodium: 140 mmol/L (ref 135–145)
TOTAL PROTEIN: 6.8 g/dL (ref 6.5–8.1)

## 2017-12-25 LAB — CBC WITH DIFFERENTIAL (CANCER CENTER ONLY)
BASOS PCT: 1 %
Basophils Absolute: 0 10*3/uL (ref 0.0–0.1)
Eosinophils Absolute: 0.2 10*3/uL (ref 0.0–0.5)
Eosinophils Relative: 6 %
HEMATOCRIT: 34.3 % — AB (ref 34.8–46.6)
HEMOGLOBIN: 10.9 g/dL — AB (ref 11.6–15.9)
Lymphocytes Relative: 48 %
Lymphs Abs: 1.2 10*3/uL (ref 0.9–3.3)
MCH: 29.6 pg (ref 25.1–34.0)
MCHC: 31.8 g/dL (ref 31.5–36.0)
MCV: 93.2 fL (ref 79.5–101.0)
MONO ABS: 0.2 10*3/uL (ref 0.1–0.9)
MONOS PCT: 8 %
NEUTROS ABS: 0.9 10*3/uL — AB (ref 1.5–6.5)
Neutrophils Relative %: 37 %
Platelet Count: 177 10*3/uL (ref 145–400)
RBC: 3.68 MIL/uL — ABNORMAL LOW (ref 3.70–5.45)
RDW: 15.9 % — AB (ref 11.2–14.5)
WBC Count: 2.5 10*3/uL — ABNORMAL LOW (ref 3.9–10.3)

## 2017-12-25 MED ORDER — SODIUM CHLORIDE 0.9% FLUSH
10.0000 mL | INTRAVENOUS | Status: DC | PRN
  Administered 2017-12-25: 10 mL
  Filled 2017-12-25: qty 10

## 2017-12-25 MED ORDER — HEPARIN SOD (PORK) LOCK FLUSH 100 UNIT/ML IV SOLN
500.0000 [IU] | Freq: Once | INTRAVENOUS | Status: AC | PRN
  Administered 2017-12-25: 500 [IU]
  Filled 2017-12-25: qty 5

## 2017-12-25 NOTE — Telephone Encounter (Signed)
Per 8/29 los, no new orders.  Patient did stop by to get schedule of appointments for Sept and November.

## 2017-12-25 NOTE — Progress Notes (Signed)
Patient Care Team: Deland Pretty, MD as PCP - General (Internal Medicine) Baxley, Cresenciano Lick, MD (Internal Medicine)  DIAGNOSIS:  Encounter Diagnosis  Name Primary?  . Malignant neoplasm of upper-outer quadrant of right breast in female, estrogen receptor negative ([place])     SUMMARY OF ONCOLOGIC HISTORY:   Malignant neoplasm of upper-outer quadrant of right breast in female, estrogen receptor negative ([place])   07/09/2017 Initial Diagnosis    Patient palpated a right breast mass which was evaluated by mammogram and ultrasound and a breast MRI which revealed 4.5 x 4 cm necrotic tumor with suspicious multiple right axillary lymph nodes; biopsy revealed IDC grade 3 ER 0%, PR 0%, HER-2 positive ratio 3.08, Ki-67 70%, T2NX stage IIa/IIb    07/18/2017 - 11/04/2017 Neo-Adjuvant Chemotherapy    TCH Perjeta x6 cycles followed by Herceptin Perjeta maintenance    08/15/2017 Genetic Testing    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  The Multi-Gene Panel offered by [facility] includes sequencing and/or deletion duplication testing of the following 83 genes: ALK, APC, ATM, AXIN2,BAP1,  BARD1, BLM, BMPR1A, BRCA1, BRCA2, BRIP1, CASR, CDC73, CDH1, CDK4, CDKN1B, CDKN1C, CDKN2A (p14ARF), CDKN2A (p16INK4a), CEBPA, CHEK2, CTNNA1, DICER1, DIS3L2, EGFR (c.2369C>T, p.Thr790Met variant only), EPCAM (Deletion/duplication testing only), FH, FLCN, GATA2, GPC3, GREM1 (Promoter region deletion/duplication testing only), HOXB13 (c.251G>A, p.Gly84Glu), HRAS, KIT, MAX, MEN1, MET, MITF (c.952G>A, p.Glu318Lys variant only), MLH1, MSH2, MSH3, MSH6, MUTYH, NBN, NF1, NF2, NTHL1, PALB2, PDGFRA, PHOX2B, PMS2, POLD1, POLE, POT1, PRKAR1A, PTCH1, PTEN, RAD50, RAD51C, RAD51D, RB1, RECQL4, RET, RUNX1, SDHAF2, SDHA (sequence changes only), SDHB, SDHC, SDHD, SMAD4, SMARCA4, SMARCB1, SMARCE1, STK11, SUFU, TERT, TERT, TMEM127, TP53, TSC1, TSC2, VHL, WRN and WT1.  The report date is August 15, 2017.    11/18/2017 Surgery    Bilateral mastectomies: Right mastectomy: IDC grade 3 3.8 cm margins negative, 0/4 lymph nodes negative, ER 0%, PR 0%, HER-2 negative ratio 1.3, IHC HER-2 negative; Ki-67 70%, RCB class II; left mastectomy: [facility]    12/18/2017 -  Chemotherapy    Adjuvant chemotherapy with dose dense Adriamycin and Cytoxan x4     CHIEF COMPLIANT: Cycle 1 day 8 dose dense Adriamycin and Cytoxan  INTERVAL HISTORY: Rebecca [name] is a 47 year old with above-mentioned history of initially HER-2 positive breast cancer treated with neoadjuvant chemotherapy with [facility] [facility] but on the final pathology she had residual breast cancer that was triple negative.  So she is now receiving 4 cycles of adjuvant Adriamycin and Cytoxan.  Today cycle 1 day 8.  Overall she tolerated chemo reasonably well.  She had fatigue, mild nausea, leg cramps.  However overall she did much better.  She has occasional palpitations from anxiety.  REVIEW OF SYSTEMS:   Constitutional: Denies fevers, chills or abnormal weight loss Eyes: Denies blurriness of vision Ears, nose, mouth, throat, and face: Denies mucositis or sore throat Respiratory: Denies cough, dyspnea or wheezes Cardiovascular: Denies palpitation, chest discomfort Gastrointestinal:  Denies nausea, heartburn or change in bowel habits Skin: Denies abnormal skin rashes Lymphatics: Denies new lymphadenopathy or easy bruising Neurological:Denies numbness, tingling or new weaknesses Behavioral/Psych: Mood is stable, no new changes  Extremities: No lower extremity edema   All other systems were reviewed with the patient and are negative.  I have reviewed the past medical history, past surgical history, social history and family history with the patient and they are unchanged from previous note.  ALLERGIES:  has no allergies on file.  MEDICATIONS:  Current Outpatient Medications  Medication Sig Dispense Refill  .  carvedilol (COREG) 3.125 MG tablet Take 1 tablet (3.125 mg total) by  mouth 2 (two) times daily. 60 tablet 3  . esomeprazole (NEXIUM) 40 MG capsule Nexium 40 mg capsule,delayed release  Take 1 capsule every day by oral route.    . gabapentin (NEURONTIN) 300 MG capsule Take 1 capsule (300 mg total) by mouth 3 (three) times daily. 90 capsule 1  . levothyroxine (SYNTHROID, LEVOTHROID) 137 MCG tablet Take 137 mcg by mouth daily before breakfast.    . lidocaine-prilocaine (EMLA) cream Apply to affected area once 30 g 3  . LORazepam (ATIVAN) 1 MG tablet Take 1 tablet (1 mg total) by mouth every 8 (eight) hours as needed (nausea and vomiting). 30 tablet 0  . losartan (COZAAR) 25 MG tablet Take 1 tablet (25 mg total) by mouth daily. 30 tablet 3  . metFORMIN (GLUCOPHAGE) 500 MG tablet Take by mouth daily with breakfast.    . methocarbamol (ROBAXIN) 500 MG tablet Take 1 tablet (500 mg total) by mouth every 8 (eight) hours as needed for muscle spasms. 30 tablet 0  . ondansetron (ZOFRAN) 8 MG tablet Take 1 tablet (8 mg total) by mouth 2 (two) times daily as needed. Start on the third day after chemotherapy. 30 tablet 1  . oxyCODONE (OXY IR/ROXICODONE) 5 MG immediate release tablet Take 1-2 tablets (5-10 mg total) by mouth every 4 (four) hours as needed for moderate pain. 40 tablet 0  . prochlorperazine (COMPAZINE) 10 MG tablet Take 1 tablet (10 mg total) by mouth every 6 (six) hours as needed (Nausea or vomiting). 30 tablet 1  . triamterene-hydrochlorothiazide (MAXZIDE) 75-50 MG tablet One po daily prn lower extremity edema 30 tablet 2   No current facility-administered medications for this visit.    Facility-Administered Medications Ordered in Other Visits  Medication Dose Route Frequency Provider Last Rate Last Dose  . LORazepam (ATIVAN) injection 1 mg  1 mg Intravenous Once Harle Stanford., PA-C        PHYSICAL EXAMINATION: ECOG PERFORMANCE STATUS: 1 - Symptomatic but completely ambulatory  Vitals:   12/25/17 0908  BP: 109/74  Pulse: 83  Resp: 18  Temp: 98.4 F  (36.9 C)  SpO2: 100%   Filed Weights   12/25/17 0908  Weight: 223 lb 4.8 oz (101.3 kg)    GENERAL:alert, no distress and comfortable SKIN: skin color, texture, turgor are normal, no rashes or significant lesions EYES: normal, Conjunctiva are pink and non-injected, sclera clear OROPHARYNX:no exudate, no erythema and lips, buccal mucosa, and tongue normal  NECK: supple, thyroid normal size, non-tender, without nodularity LYMPH:  no palpable lymphadenopathy in the cervical, axillary or inguinal LUNGS: clear to auscultation and percussion with normal breathing effort HEART: regular rate & rhythm and no murmurs and no lower extremity edema ABDOMEN:abdomen soft, non-tender and normal bowel sounds MUSCULOSKELETAL:no cyanosis of digits and no clubbing  NEURO: alert & oriented x 3 with fluent speech, no focal motor/sensory deficits EXTREMITIES: No lower extremity edema   LABORATORY DATA:  I have reviewed the data as listed CMP Latest Ref Rng & Units 12/25/2017 12/18/2017 11/04/2017  Glucose 70 - 99 mg/dL 113(H) 106(H) 110(H)  BUN 6 - 20 mg/dL 16 16 11   Creatinine 0.44 - 1.00 mg/dL 0.80 0.85 0.90  Sodium 135 - 145 mmol/L 140 140 140  Potassium 3.5 - 5.1 mmol/L 4.2 4.4 4.0  Chloride 98 - 111 mmol/L 104 106 104  CO2 22 - 32 mmol/L 26 26 27   Calcium 8.9 - 10.3 mg/dL 9.3  9.2 9.6  Total Protein 6.5 - 8.1 g/dL 6.8 6.6 7.1  Total Bilirubin 0.3 - 1.2 mg/dL 0.3 <0.2(L) 0.3  Alkaline Phos 38 - 126 U/L 90 67 64  AST 15 - 41 U/L 9(L) 14(L) 15  ALT 0 - 44 U/L 12 13 19     Lab Results  Component Value Date   WBC 2.5 (L) 12/25/2017   HGB 10.9 (L) 12/25/2017   HCT 34.3 (L) 12/25/2017   MCV 93.2 12/25/2017   PLT 177 12/25/2017   NEUTROABS 0.9 (L) 12/25/2017    ASSESSMENT & PLAN:  Malignant neoplasm of upper-outer quadrant of right breast in female, estrogen receptor negative ([place]) 07/09/2017:Patient palpated a right breast mass which was evaluated by mammogram and ultrasound and a breast MRI which  revealed 4.5 x 4 cm necrotic tumor with suspicious multiple right axillary lymph nodes; biopsy revealed IDC grade 3 ER 0%, PR 0%, HER-2 positive ratio 3.08, Ki-67 70%, T2NX stage IIa/IIb  Treatment planbased on multidisciplinary tumor board: 1. Neoadjuvant chemotherapy with TCH Perjeta 6 cycles followed by Herceptinand Perjetamaintenance for 1 year 2. Followed bybilateral mastectomies with targeted node dissection on the right7/23/2019 3.  Due to significant residual disease, additional adjuvant chemo with Adriamycin and Cytoxan starting 12/18/2017 4. Followed by adjuvant radiation therapy ----------------------------------------------------------------------------- 11/18/2017:Bilateral mastectomies: Right mastectomy: IDC grade 3 3.8 cm margins negative, 0/4 lymph nodes negative, ER 0%, PR 0%, HER-2 negative ratio 1.3, IHC HER-2 negative; Ki-67 70%, RCB class II; left mastectomy: [facility]  Current treatment: Cycle 1 day 8 dose dense Adriamycin and Cytoxan Chemo toxicities: 1.  Mild fatigue 2. mild nausea 3.  Leg cramps: Encouraged her to drink more water  Labs have been reviewed Echocardiogram 12/16/2017: EF normal 50 to 55% Herceptin and Perjeta will be on hold until Adriamycin and Cytoxan are complete Return to clinic in 1 week for cycle 2    Orders Placed This Encounter  Procedures  . Thyroid Panel With TSH    Standing Status:   Future    Standing Expiration Date:   12/25/2018   The patient has a good understanding of the overall plan. she agrees with it. she will call with any problems that may develop before the next visit here.   Harriette Ohara, MD 12/25/17

## 2017-12-25 NOTE — Assessment & Plan Note (Signed)
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 Neoadjuvant chemotherapy with TCH Perjeta 6 cycles followed by Herceptinand Perjetamaintenance for 1 year 2. Followed bybilateral mastectomies with targeted node dissection on the right7/23/2019 3.  Due to significant residual disease, additional adjuvant chemo with Adriamycin and Cytoxan starting 12/18/2017 4. Followed by adjuvant radiation therapy ----------------------------------------------------------------------------- 11/18/2017:Bilateral mastectomies: Right mastectomy: IDC grade 3 3.8 cm margins negative, 0/4 lymph nodes negative, ER 0%, PR 0%, HER-2 negative ratio 1.3, IHC HER-2 negative; Ki-67 70%, RCB class II; left mastectomy: [facility]  Current treatment: Cycle 1 day 8 dose dense Adriamycin and Cytoxan Chemo toxicities:  Echocardiogram 12/16/2017: EF normal 50 to 55% Herceptin and Perjeta will be on hold until Adriamycin and Cytoxan are complete Return to clinic in 1 week for cycle 2

## 2017-12-30 ENCOUNTER — Inpatient Hospital Stay (HOSPITAL_BASED_OUTPATIENT_CLINIC_OR_DEPARTMENT_OTHER): Payer: BLUE CROSS/BLUE SHIELD | Admitting: Hematology and Oncology

## 2017-12-30 ENCOUNTER — Ambulatory Visit: Payer: BLUE CROSS/BLUE SHIELD

## 2017-12-30 ENCOUNTER — Inpatient Hospital Stay: Payer: BLUE CROSS/BLUE SHIELD

## 2017-12-30 ENCOUNTER — Ambulatory Visit: Payer: BLUE CROSS/BLUE SHIELD | Admitting: Hematology and Oncology

## 2017-12-30 ENCOUNTER — Other Ambulatory Visit: Payer: BLUE CROSS/BLUE SHIELD

## 2017-12-30 ENCOUNTER — Encounter: Payer: Self-pay | Admitting: *Deleted

## 2017-12-30 ENCOUNTER — Inpatient Hospital Stay: Payer: BLUE CROSS/BLUE SHIELD | Attending: Hematology and Oncology

## 2017-12-30 DIAGNOSIS — Z79899 Other long term (current) drug therapy: Secondary | ICD-10-CM | POA: Insufficient documentation

## 2017-12-30 DIAGNOSIS — Z9013 Acquired absence of bilateral breasts and nipples: Secondary | ICD-10-CM | POA: Insufficient documentation

## 2017-12-30 DIAGNOSIS — C50411 Malignant neoplasm of upper-outer quadrant of right female breast: Secondary | ICD-10-CM | POA: Diagnosis present

## 2017-12-30 DIAGNOSIS — Z171 Estrogen receptor negative status [ER-]: Secondary | ICD-10-CM | POA: Diagnosis not present

## 2017-12-30 DIAGNOSIS — Z5111 Encounter for antineoplastic chemotherapy: Secondary | ICD-10-CM | POA: Insufficient documentation

## 2017-12-30 DIAGNOSIS — Z7689 Persons encountering health services in other specified circumstances: Secondary | ICD-10-CM | POA: Diagnosis not present

## 2017-12-30 DIAGNOSIS — Z95828 Presence of other vascular implants and grafts: Secondary | ICD-10-CM

## 2017-12-30 LAB — CBC WITH DIFFERENTIAL (CANCER CENTER ONLY)
BASOS PCT: 0 %
Basophils Absolute: 0 10*3/uL (ref 0.0–0.1)
Eosinophils Absolute: 0 10*3/uL (ref 0.0–0.5)
Eosinophils Relative: 1 %
HCT: 33.3 % — ABNORMAL LOW (ref 34.8–46.6)
HEMOGLOBIN: 10.2 g/dL — AB (ref 11.6–15.9)
LYMPHS ABS: 1.8 10*3/uL (ref 0.9–3.3)
Lymphocytes Relative: 22 %
MCH: 28.9 pg (ref 25.1–34.0)
MCHC: 30.6 g/dL — ABNORMAL LOW (ref 31.5–36.0)
MCV: 94.3 fL (ref 79.5–101.0)
Monocytes Absolute: 0.9 10*3/uL (ref 0.1–0.9)
Monocytes Relative: 11 %
NEUTROS PCT: 66 %
Neutro Abs: 5.4 10*3/uL (ref 1.5–6.5)
Platelet Count: 177 10*3/uL (ref 145–400)
RBC: 3.53 MIL/uL — AB (ref 3.70–5.45)
RDW: 16.7 % — ABNORMAL HIGH (ref 11.2–14.5)
WBC: 8.1 10*3/uL (ref 3.9–10.3)

## 2017-12-30 LAB — CMP (CANCER CENTER ONLY)
ALBUMIN: 3.7 g/dL (ref 3.5–5.0)
ALK PHOS: 76 U/L (ref 38–126)
ALT: 15 U/L (ref 0–44)
AST: 12 U/L — AB (ref 15–41)
Anion gap: 9 (ref 5–15)
BUN: 9 mg/dL (ref 6–20)
CO2: 27 mmol/L (ref 22–32)
CREATININE: 0.86 mg/dL (ref 0.44–1.00)
Calcium: 9.1 mg/dL (ref 8.9–10.3)
Chloride: 106 mmol/L (ref 98–111)
GFR, Est AFR Am: >60 mL/min (ref >60)
GFR, Estimated: >60 mL/min (ref >60)
GLUCOSE: 108 mg/dL — AB (ref 70–99)
Potassium: 4.2 mmol/L (ref 3.5–5.1)
SODIUM: 142 mmol/L (ref 135–145)
Total Bilirubin: <0.2 mg/dL — ABNORMAL LOW (ref 0.3–1.2)
Total Protein: 6 g/dL — ABNORMAL LOW (ref 6.5–8.1)

## 2017-12-30 MED ORDER — SODIUM CHLORIDE 0.9 % IV SOLN
500.0000 mg/m2 | Freq: Once | INTRAVENOUS | Status: AC
  Administered 2017-12-30: 1120 mg via INTRAVENOUS
  Filled 2017-12-30: qty 56

## 2017-12-30 MED ORDER — PALONOSETRON HCL INJECTION 0.25 MG/5ML
INTRAVENOUS | Status: AC
  Filled 2017-12-30: qty 5

## 2017-12-30 MED ORDER — SODIUM CHLORIDE 0.9% FLUSH
10.0000 mL | INTRAVENOUS | Status: DC | PRN
  Administered 2017-12-30: 10 mL
  Filled 2017-12-30: qty 10

## 2017-12-30 MED ORDER — SODIUM CHLORIDE 0.9 % IV SOLN
Freq: Once | INTRAVENOUS | Status: AC
  Administered 2017-12-30: 09:00:00 via INTRAVENOUS
  Filled 2017-12-30: qty 250

## 2017-12-30 MED ORDER — PALONOSETRON HCL INJECTION 0.25 MG/5ML
0.2500 mg | Freq: Once | INTRAVENOUS | Status: AC
  Administered 2017-12-30: 0.25 mg via INTRAVENOUS

## 2017-12-30 MED ORDER — HEPARIN SOD (PORK) LOCK FLUSH 100 UNIT/ML IV SOLN
500.0000 [IU] | Freq: Once | INTRAVENOUS | Status: AC | PRN
  Administered 2017-12-30: 500 [IU]
  Filled 2017-12-30: qty 5

## 2017-12-30 MED ORDER — DOXORUBICIN HCL CHEMO IV INJECTION 2 MG/ML
50.0000 mg/m2 | Freq: Once | INTRAVENOUS | Status: AC
  Administered 2017-12-30: 112 mg via INTRAVENOUS
  Filled 2017-12-30: qty 56

## 2017-12-30 MED ORDER — SODIUM CHLORIDE 0.9 % IV SOLN
Freq: Once | INTRAVENOUS | Status: AC
  Administered 2017-12-30: 10:00:00 via INTRAVENOUS
  Filled 2017-12-30: qty 5

## 2017-12-30 NOTE — Assessment & Plan Note (Signed)
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 Neoadjuvant chemotherapy with TCH Perjeta 6 cycles followed by Herceptinand Perjetamaintenance for 1 year 2. Followed bybilateral mastectomies with targeted node dissection on the right7/23/2019 3.Due to significant residual disease, additional adjuvant chemo with Adriamycin and Cytoxan starting 12/18/2017 4. Followed by adjuvant radiation therapy ----------------------------------------------------------------------------- 11/18/2017:Bilateral mastectomies: Right mastectomy: IDC grade 3 3.8 cm margins negative, 0/4 lymph nodes negative, ER 0%, PR 0%, HER-2 negative ratio 1.3, IHC HER-2 negative; Ki-67 70%, RCB class II; left mastectomy: [facility]  Current treatment: Cycle 2 day 1 dose dense Adriamycin and Cytoxan Chemo toxicities: 1.  Mild fatigue 2. mild nausea 3.  Leg cramps: Encouraged her to drink more water  Labs have been reviewed Echocardiogram 12/16/2017: EF normal 50 to 55% Herceptin and Perjeta will be on hold until Adriamycin and Cytoxan are complete Return to clinic in 2 weeks for cycle 3

## 2017-12-30 NOTE — Progress Notes (Signed)
 Patient Care Team: Pharr, Walter, MD as PCP - General (Internal Medicine) Baxley, Mary J, MD (Internal Medicine)  DIAGNOSIS:  Encounter Diagnosis  Name Primary?  . Malignant neoplasm of upper-outer quadrant of right breast in female, estrogen receptor negative ([facility])     SUMMARY OF ONCOLOGIC HISTORY:   Malignant neoplasm of upper-outer quadrant of right breast in female, estrogen receptor negative ([facility])   07/09/2017 Initial Diagnosis    Patient palpated a right breast mass which was evaluated by mammogram and ultrasound and a breast MRI which revealed 4.5 x 4 cm necrotic tumor with suspicious multiple right axillary lymph nodes; biopsy revealed IDC grade 3 ER 0%, PR 0%, HER-2 positive ratio 3.08, Ki-67 70%, T2NX stage IIa/IIb    07/18/2017 - 11/04/2017 Neo-Adjuvant Chemotherapy    TCH Perjeta x6 cycles followed by Herceptin Perjeta maintenance    08/15/2017 Genetic Testing    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  The Multi-Gene Panel offered by [facility] includes sequencing and/or deletion duplication testing of the following 83 genes: ALK, APC, ATM, AXIN2,BAP1,  BARD1, BLM, BMPR1A, BRCA1, BRCA2, BRIP1, CASR, CDC73, CDH1, CDK4, CDKN1B, CDKN1C, CDKN2A (p14ARF), CDKN2A (p16INK4a), CEBPA, CHEK2, CTNNA1, DICER1, DIS3L2, EGFR (c.2369C>T, p.Thr790Met variant only), EPCAM (Deletion/duplication testing only), FH, FLCN, GATA2, GPC3, GREM1 (Promoter region deletion/duplication testing only), HOXB13 (c.251G>A, p.Gly84Glu), HRAS, KIT, MAX, MEN1, MET, MITF (c.952G>A, p.Glu318Lys variant only), MLH1, MSH2, MSH3, MSH6, MUTYH, NBN, NF1, NF2, NTHL1, PALB2, PDGFRA, PHOX2B, PMS2, POLD1, POLE, POT1, PRKAR1A, PTCH1, PTEN, RAD50, RAD51C, RAD51D, RB1, RECQL4, RET, RUNX1, SDHAF2, SDHA (sequence changes only), SDHB, SDHC, SDHD, SMAD4, SMARCA4, SMARCB1, SMARCE1, STK11, SUFU, TERT, TERT, TMEM127, TP53, TSC1, TSC2, VHL, WRN and WT1.  The report date is August 15, 2017.    11/18/2017 Surgery    Bilateral mastectomies: Right mastectomy: IDC grade 3 3.8 cm margins negative, 0/4 lymph nodes negative, ER 0%, PR 0%, HER-2 negative ratio 1.3, IHC HER-2 negative; Ki-67 70%, RCB class II; left mastectomy: [facility]    12/18/2017 -  Chemotherapy    Adjuvant chemotherapy with dose dense Adriamycin and Cytoxan x4     CHIEF COMPLIANT: Cycle 2-day 1 dose dense Adriamycin and Cytoxan  INTERVAL HISTORY: Rebecca [name] is a 47-year-old with above-mentioned history of right breast cancer treated with bilateral mastectomies and is continuing on adjuvant chemotherapy and today's cycle 2 of dose dense Adriamycin and Cytoxan.  She tolerated cycle 1 extremely well.  She is feeling quite better and has recovered from prior chemotherapy.  Mild nausea.  She continues to have mild fatigue.  She had an episode of diarrhea  REVIEW OF SYSTEMS:   Constitutional: Denies fevers, chills or abnormal weight loss Eyes: Denies blurriness of vision Ears, nose, mouth, throat, and face: Denies mucositis or sore throat Respiratory: Denies cough, dyspnea or wheezes Cardiovascular: Denies palpitation, chest discomfort Gastrointestinal: Mild nausea, diarrhea for 2 days Skin: Denies abnormal skin rashes Lymphatics: Denies new lymphadenopathy or easy bruising Neurological:Denies numbness, tingling or new weaknesses Behavioral/Psych: Mood is stable, no new changes  Extremities: No lower extremity edema Breast: Bilateral mastectomies All other systems were reviewed with the patient and are negative.  I have reviewed the past medical history, past surgical history, social history and family history with the patient and they are unchanged from previous note.  ALLERGIES:  has no allergies on file.  MEDICATIONS:  Current Outpatient Medications  Medication Sig Dispense Refill  . carvedilol (COREG) 3.125 MG tablet Take 1 tablet (3.125 mg total) by mouth 2 (two) times   daily. 60 tablet 3  . esomeprazole (NEXIUM) 40 MG capsule  Nexium 40 mg capsule,delayed release  Take 1 capsule every day by oral route.    . gabapentin (NEURONTIN) 300 MG capsule Take 1 capsule (300 mg total) by mouth 3 (three) times daily. 90 capsule 1  . levothyroxine (SYNTHROID, LEVOTHROID) 137 MCG tablet Take 137 mcg by mouth daily before breakfast.    . lidocaine-prilocaine (EMLA) cream Apply to affected area once 30 g 3  . LORazepam (ATIVAN) 1 MG tablet Take 1 tablet (1 mg total) by mouth every 8 (eight) hours as needed (nausea and vomiting). 30 tablet 0  . losartan (COZAAR) 25 MG tablet Take 1 tablet (25 mg total) by mouth daily. 30 tablet 3  . metFORMIN (GLUCOPHAGE) 500 MG tablet Take by mouth daily with breakfast.    . methocarbamol (ROBAXIN) 500 MG tablet Take 1 tablet (500 mg total) by mouth every 8 (eight) hours as needed for muscle spasms. 30 tablet 0  . ondansetron (ZOFRAN) 8 MG tablet Take 1 tablet (8 mg total) by mouth 2 (two) times daily as needed. Start on the third day after chemotherapy. 30 tablet 1  . oxyCODONE (OXY IR/ROXICODONE) 5 MG immediate release tablet Take 1-2 tablets (5-10 mg total) by mouth every 4 (four) hours as needed for moderate pain. 40 tablet 0  . prochlorperazine (COMPAZINE) 10 MG tablet Take 1 tablet (10 mg total) by mouth every 6 (six) hours as needed (Nausea or vomiting). 30 tablet 1  . triamterene-hydrochlorothiazide (MAXZIDE) 75-50 MG tablet One po daily prn lower extremity edema 30 tablet 2   No current facility-administered medications for this visit.    Facility-Administered Medications Ordered in Other Visits  Medication Dose Route Frequency Provider Last Rate Last Dose  . LORazepam (ATIVAN) injection 1 mg  1 mg Intravenous Once Harle Stanford., PA-C        PHYSICAL EXAMINATION: ECOG PERFORMANCE STATUS: 1 - Symptomatic but completely ambulatory  Vitals:   12/30/17 0835  BP: (!) 117/59  Pulse: 76  Resp: 18  Temp: 98.9 F (37.2 C)  SpO2: 97%   Filed Weights   12/30/17 0835  Weight: 227 lb 9.6  oz (103.2 kg)    GENERAL:alert, no distress and comfortable SKIN: skin color, texture, turgor are normal, no rashes or significant lesions EYES: normal, Conjunctiva are pink and non-injected, sclera clear OROPHARYNX:no exudate, no erythema and lips, buccal mucosa, and tongue normal  NECK: supple, thyroid normal size, non-tender, without nodularity LYMPH:  no palpable lymphadenopathy in the cervical, axillary or inguinal LUNGS: clear to auscultation and percussion with normal breathing effort HEART: regular rate & rhythm and no murmurs and no lower extremity edema ABDOMEN:abdomen soft, non-tender and normal bowel sounds MUSCULOSKELETAL:no cyanosis of digits and no clubbing  NEURO: alert & oriented x 3 with fluent speech, no focal motor/sensory deficits EXTREMITIES: No lower extremity edema   LABORATORY DATA:  I have reviewed the data as listed CMP Latest Ref Rng & Units 12/25/2017 12/18/2017 11/04/2017  Glucose 70 - 99 mg/dL 113(H) 106(H) 110(H)  BUN 6 - 20 mg/dL _0 Creatinine 0.44 - 1.00 mg/dL 0.80 0.85 0.90  Sodium 135 - 145 mmol/L 140 140 140  Potassium 3.5 - 5.1 mmol/L 4.2 4.4 4.0  Chloride 98 - 111 mmol/L 104 106 104  CO2 22 - 32 mmol/L _1 Calcium 8.9 - 10.3 mg/dL 9.3 9.2 9.6  Total Protein 6.5 - 8.1 g/dL 6.8 6.6 7.1  Total Bilirubin  0.3 - 1.2 mg/dL 0.3 <0.2(L) 0.3  Alkaline Phos 38 - 126 U/L 90 67 64  AST 15 - 41 U/L 9(L) 14(L) 15  ALT 0 - 44 U/L 12 13 19    Lab Results  Component Value Date   WBC 8.1 12/30/2017   HGB 10.2 (L) 12/30/2017   HCT 33.3 (L) 12/30/2017   MCV 94.3 12/30/2017   PLT 177 12/30/2017   NEUTROABS 5.4 12/30/2017    ASSESSMENT & PLAN:  Malignant neoplasm of upper-outer quadrant of right breast in female, estrogen receptor negative ([facility]) 07/09/2017:Patient palpated a right breast mass which was evaluated by mammogram and ultrasound and a breast MRI which revealed 4.5 x 4 cm necrotic tumor with suspicious multiple right axillary lymph  nodes; biopsy revealed IDC grade 3 ER 0%, PR 0%, HER-2 positive ratio 3.08, Ki-67 70%, T2NX stage IIa/IIb  Treatment planbased on multidisciplinary tumor board: 1. Neoadjuvant chemotherapy with TCH Perjeta 6 cycles followed by Herceptinand Perjetamaintenance for 1 year 2. Followed bybilateral mastectomies with targeted node dissection on the right7/23/2019 3.Due to significant residual disease, additional adjuvant chemo with Adriamycin and Cytoxan starting 12/18/2017 4. Followed by adjuvant radiation therapy ----------------------------------------------------------------------------- 11/18/2017:Bilateral mastectomies: Right mastectomy: IDC grade 3 3.8 cm margins negative, 0/4 lymph nodes negative, ER 0%, PR 0%, HER-2 negative ratio 1.3, IHC HER-2 negative; Ki-67 70%, RCB class II; left mastectomy: [facility]  Current treatment: Cycle 2 day 1 dose dense Adriamycin and Cytoxan Chemo toxicities: 1.  Mild fatigue 2. mild nausea 3.  Leg cramps: Encouraged her to drink more water 4.  Diarrhea: If it happens again I instructed her to take Imodium. 5.  Chemotherapy-induced anemia: Hemoglobin is 10.2 watchful monitoring  Labs have been reviewed Echocardiogram 12/16/2017: EF normal 50 to 55% Herceptin and Perjeta will be on hold until Adriamycin and Cytoxan are complete Return to clinic in 2 weeks for cycle 3  No orders of the defined types were placed in this encounter.  The patient has a good understanding of the overall plan. she agrees with it. she will call with any problems that may develop before the next visit here.   Viinay K Gudena, MD 12/30/17    

## 2017-12-30 NOTE — Patient Instructions (Signed)
[facility] Discharge Instructions for Patients Receiving Chemotherapy  Today you received the following chemotherapy agents: Adriamycin, Cytoxan  To help prevent nausea and vomiting after your treatment, we encourage you to take your nausea medication as directed.   If you develop nausea and vomiting that is not controlled by your nausea medication, call the clinic.   BELOW ARE SYMPTOMS THAT SHOULD BE REPORTED IMMEDIATELY:  *FEVER GREATER THAN 100.5 F  *CHILLS WITH OR WITHOUT FEVER  NAUSEA AND VOMITING THAT IS NOT CONTROLLED WITH YOUR NAUSEA MEDICATION  *UNUSUAL SHORTNESS OF BREATH  *UNUSUAL BRUISING OR BLEEDING  TENDERNESS IN MOUTH AND THROAT WITH OR WITHOUT PRESENCE OF ULCERS  *URINARY PROBLEMS  *BOWEL PROBLEMS  UNUSUAL RASH Items with * indicate a potential emergency and should be followed up as soon as possible.  Feel free to call the clinic should you have any questions or concerns. The clinic phone number is [phone].  Please show the [facility] at check-in to the Emergency Department and triage nurse.  Doxorubicin injection What is this medicine? DOXORUBICIN (dox oh ROO bi sin) is a chemotherapy drug. It is used to treat many kinds of cancer like leukemia, lymphoma, neuroblastoma, sarcoma, and Wilms' tumor. It is also used to treat bladder cancer, breast cancer, lung cancer, ovarian cancer, stomach cancer, and thyroid cancer. This medicine may be used for other purposes; ask your health care provider or pharmacist if you have questions. COMMON BRAND NAME(S): Adriamycin, Adriamycin PFS, Adriamycin RDF, Rubex What should I tell my health care provider before I take this medicine? They need to know if you have any of these conditions: -heart disease -history of low blood counts caused by a medicine -liver disease -recent or ongoing radiation therapy -an unusual or allergic reaction to doxorubicin, other chemotherapy agents, other medicines,  foods, dyes, or preservatives -pregnant or trying to get pregnant -breast-feeding How should I use this medicine? This drug is given as an infusion into a vein. It is administered in a hospital or clinic by a specially trained health care professional. If you have pain, swelling, burning or any unusual feeling around the site of your injection, tell your health care professional right away. Talk to your pediatrician regarding the use of this medicine in children. Special care may be needed. Overdosage: If you think you have taken too much of this medicine contact a poison control center or emergency room at once. NOTE: This medicine is only for you. Do not share this medicine with others. What if I miss a dose? It is important not to miss your dose. Call your doctor or health care professional if you are unable to keep an appointment. What may interact with this medicine? This medicine may interact with the following medications: -6-mercaptopurine -paclitaxel -phenytoin -St. John's Wort -trastuzumab -verapamil This list may not describe all possible interactions. Give your health care provider a list of all the medicines, herbs, non-prescription drugs, or dietary supplements you use. Also tell them if you smoke, drink alcohol, or use illegal drugs. Some items may interact with your medicine. What should I watch for while using this medicine? This drug may make you feel generally unwell. This is not uncommon, as chemotherapy can affect healthy cells as well as cancer cells. Report any side effects. Continue your course of treatment even though you feel ill unless your doctor tells you to stop. There is a maximum amount of this medicine you should receive throughout your life. The amount depends on the  medical condition being treated and your overall health. Your doctor will watch how much of this medicine you receive in your lifetime. Tell your doctor if you have taken this medicine before. You  may need blood work done while you are taking this medicine. Your urine may turn red for a few days after your dose. This is not blood. If your urine is dark or brown, call your doctor. In some cases, you may be given additional medicines to help with side effects. Follow all directions for their use. Call your doctor or health care professional for advice if you get a fever, chills or sore throat, or other symptoms of a cold or flu. Do not treat yourself. This drug decreases your body's ability to fight infections. Try to avoid being around people who are sick. This medicine may increase your risk to bruise or bleed. Call your doctor or health care professional if you notice any unusual bleeding. Talk to your doctor about your risk of cancer. You may be more at risk for certain types of cancers if you take this medicine. Do not become pregnant while taking this medicine or for 6 months after stopping it. Women should inform their doctor if they wish to become pregnant or think they might be pregnant. Men should not father a child while taking this medicine and for 6 months after stopping it. There is a potential for serious side effects to an unborn child. Talk to your health care professional or pharmacist for more information. Do not breast-feed an infant while taking this medicine. This medicine has caused ovarian failure in some women and reduced sperm counts in some men This medicine may interfere with the ability to have a child. Talk with your doctor or health care professional if you are concerned about your fertility. What side effects may I notice from receiving this medicine? Side effects that you should report to your doctor or health care professional as soon as possible: -allergic reactions like skin rash, itching or hives, swelling of the face, lips, or tongue -breathing problems -chest pain -fast or irregular heartbeat -low blood counts - this medicine may decrease the number of white  blood cells, red blood cells and platelets. You may be at increased risk for infections and bleeding. -pain, redness, or irritation at site where injected -signs of infection - fever or chills, cough, sore throat, pain or difficulty passing urine -signs of decreased platelets or bleeding - bruising, pinpoint red spots on the skin, black, tarry stools, blood in the urine -swelling of the ankles, feet, hands -tiredness -weakness Side effects that usually do not require medical attention (report to your doctor or health care professional if they continue or are bothersome): -diarrhea -hair loss -mouth sores -nail discoloration or damage -nausea -red colored urine -vomiting This list may not describe all possible side effects. Call your doctor for medical advice about side effects. You may report side effects to FDA at 1-800-FDA-1088. Where should I keep my medicine? This drug is given in a hospital or clinic and will not be stored at home. NOTE: This sheet is a summary. It may not cover all possible information. If you have questions about this medicine, talk to your doctor, pharmacist, or health care provider.  2018 Elsevier/Gold Standard (2015-06-12 11:28:51)  Cyclophosphamide injection What is this medicine? CYCLOPHOSPHAMIDE (sye kloe FOSS fa mide) is a chemotherapy drug. It slows the growth of cancer cells. This medicine is used to treat many types of cancer like lymphoma, myeloma,  leukemia, breast cancer, and ovarian cancer, to name a few. This medicine may be used for other purposes; ask your health care provider or pharmacist if you have questions. COMMON BRAND NAME(S): Cytoxan, Neosar What should I tell my health care provider before I take this medicine? They need to know if you have any of these conditions: -blood disorders -history of other chemotherapy -infection -kidney disease -liver disease -recent or ongoing radiation therapy -tumors in the bone marrow -an unusual or  allergic reaction to cyclophosphamide, other chemotherapy, other medicines, foods, dyes, or preservatives -pregnant or trying to get pregnant -breast-feeding How should I use this medicine? This drug is usually given as an injection into a vein or muscle or by infusion into a vein. It is administered in a hospital or clinic by a specially trained health care professional. Talk to your pediatrician regarding the use of this medicine in children. Special care may be needed. Overdosage: If you think you have taken too much of this medicine contact a poison control center or emergency room at once. NOTE: This medicine is only for you. Do not share this medicine with others. What if I miss a dose? It is important not to miss your dose. Call your doctor or health care professional if you are unable to keep an appointment. What may interact with this medicine? This medicine may interact with the following medications: -amiodarone -amphotericin B -azathioprine -certain antiviral medicines for HIV or AIDS such as protease inhibitors (e.g., indinavir, ritonavir) and zidovudine -certain blood pressure medications such as benazepril, captopril, enalapril, fosinopril, lisinopril, moexipril, monopril, perindopril, quinapril, ramipril, trandolapril -certain cancer medications such as anthracyclines (e.g., daunorubicin, doxorubicin), busulfan, cytarabine, paclitaxel, pentostatin, tamoxifen, trastuzumab -certain diuretics such as chlorothiazide, chlorthalidone, hydrochlorothiazide, indapamide, metolazone -certain medicines that treat or prevent blood clots like warfarin -certain muscle relaxants such as succinylcholine -cyclosporine -etanercept -indomethacin -medicines to increase blood counts like filgrastim, pegfilgrastim, sargramostim -medicines used as general anesthesia -metronidazole -natalizumab This list may not describe all possible interactions. Give your health care provider a list of all the  medicines, herbs, non-prescription drugs, or dietary supplements you use. Also tell them if you smoke, drink alcohol, or use illegal drugs. Some items may interact with your medicine. What should I watch for while using this medicine? Visit your doctor for checks on your progress. This drug may make you feel generally unwell. This is not uncommon, as chemotherapy can affect healthy cells as well as cancer cells. Report any side effects. Continue your course of treatment even though you feel ill unless your doctor tells you to stop. Drink water or other fluids as directed. Urinate often, even at night. In some cases, you may be given additional medicines to help with side effects. Follow all directions for their use. Call your doctor or health care professional for advice if you get a fever, chills or sore throat, or other symptoms of a cold or flu. Do not treat yourself. This drug decreases your body's ability to fight infections. Try to avoid being around people who are sick. This medicine may increase your risk to bruise or bleed. Call your doctor or health care professional if you notice any unusual bleeding. Be careful brushing and flossing your teeth or using a toothpick because you may get an infection or bleed more easily. If you have any dental work done, tell your dentist you are receiving this medicine. You may get drowsy or dizzy. Do not drive, use machinery, or do anything that needs mental alertness until  you know how this medicine affects you. Do not become pregnant while taking this medicine or for 1 year after stopping it. Women should inform their doctor if they wish to become pregnant or think they might be pregnant. Men should not father a child while taking this medicine and for 4 months after stopping it. There is a potential for serious side effects to an unborn child. Talk to your health care professional or pharmacist for more information. Do not breast-feed an infant while taking  this medicine. This medicine may interfere with the ability to have a child. This medicine has caused ovarian failure in some women. This medicine has caused reduced sperm counts in some men. You should talk with your doctor or health care professional if you are concerned about your fertility. If you are going to have surgery, tell your doctor or health care professional that you have taken this medicine. What side effects may I notice from receiving this medicine? Side effects that you should report to your doctor or health care professional as soon as possible: -allergic reactions like skin rash, itching or hives, swelling of the face, lips, or tongue -low blood counts - this medicine may decrease the number of white blood cells, red blood cells and platelets. You may be at increased risk for infections and bleeding. -signs of infection - fever or chills, cough, sore throat, pain or difficulty passing urine -signs of decreased platelets or bleeding - bruising, pinpoint red spots on the skin, black, tarry stools, blood in the urine -signs of decreased red blood cells - unusually weak or tired, fainting spells, lightheadedness -breathing problems -dark urine -dizziness -palpitations -swelling of the ankles, feet, hands -trouble passing urine or change in the amount of urine -weight gain -yellowing of the eyes or skin Side effects that usually do not require medical attention (report to your doctor or health care professional if they continue or are bothersome): -changes in nail or skin color -hair loss -missed menstrual periods -mouth sores -nausea, vomiting This list may not describe all possible side effects. Call your doctor for medical advice about side effects. You may report side effects to FDA at 1-800-FDA-1088. Where should I keep my medicine? This drug is given in a hospital or clinic and will not be stored at home. NOTE: This sheet is a summary. It may not cover all possible  information. If you have questions about this medicine, talk to your doctor, pharmacist, or health care provider.  2018 Elsevier/Gold Standard (2012-02-28 16:22:58)

## 2017-12-31 LAB — THYROID PANEL WITH TSH
Free Thyroxine Index: 1.9 (ref 1.2–4.9)
T3 Uptake Ratio: 24 % (ref 24–39)
T4, Total: 7.8 ug/dL (ref 4.5–12.0)
TSH: 8.64 u[IU]/mL — AB (ref 0.450–4.500)

## 2018-01-01 ENCOUNTER — Inpatient Hospital Stay: Payer: BLUE CROSS/BLUE SHIELD

## 2018-01-01 ENCOUNTER — Ambulatory Visit: Payer: BLUE CROSS/BLUE SHIELD

## 2018-01-01 ENCOUNTER — Ambulatory Visit: Payer: BLUE CROSS/BLUE SHIELD | Admitting: Hematology and Oncology

## 2018-01-01 ENCOUNTER — Other Ambulatory Visit: Payer: BLUE CROSS/BLUE SHIELD

## 2018-01-01 VITALS — BP 129/79 | HR 65 | Temp 98.0°F | Resp 18

## 2018-01-01 DIAGNOSIS — Z171 Estrogen receptor negative status [ER-]: Principal | ICD-10-CM

## 2018-01-01 DIAGNOSIS — C50411 Malignant neoplasm of upper-outer quadrant of right female breast: Secondary | ICD-10-CM | POA: Diagnosis not present

## 2018-01-01 MED ORDER — PEGFILGRASTIM-CBQV 6 MG/0.6ML ~~LOC~~ SOSY
6.0000 mg | PREFILLED_SYRINGE | Freq: Once | SUBCUTANEOUS | Status: AC
  Administered 2018-01-01: 6 mg via SUBCUTANEOUS

## 2018-01-01 MED ORDER — PEGFILGRASTIM-CBQV 6 MG/0.6ML ~~LOC~~ SOSY
PREFILLED_SYRINGE | SUBCUTANEOUS | Status: AC
  Filled 2018-01-01: qty 0.6

## 2018-01-03 ENCOUNTER — Ambulatory Visit: Payer: BLUE CROSS/BLUE SHIELD

## 2018-01-06 ENCOUNTER — Ambulatory Visit: Payer: BLUE CROSS/BLUE SHIELD

## 2018-01-12 NOTE — Assessment & Plan Note (Signed)
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 Neoadjuvant chemotherapy with TCH Perjeta 6 cycles followed by Herceptinand Perjetamaintenance for 1 year 2. Followed bybilateral mastectomies with targeted node dissection on the right7/23/2019 3.Due to significant residual disease, additional adjuvant chemo with Adriamycin and Cytoxan starting 12/18/2017 4. Followed by adjuvant radiation therapy ----------------------------------------------------------------------------- 11/18/2017:Bilateral mastectomies: Right mastectomy: IDC grade 3 3.8 cm margins negative, 0/4 lymph nodes negative, ER 0%, PR 0%, HER-2 negative ratio 1.3, IHC HER-2 negative; Ki-67 70%, RCB class II; left mastectomy: [facility]  Current treatment: Cycle 3 day1dose dense Adriamycin and Cytoxan Chemo toxicities: 1.Mild fatigue 2.mild nausea 3.Leg cramps: Encouraged her to drink more water 4.  Diarrhea: If it happens again I instructed her to take Imodium. 5.  Chemotherapy-induced anemia: Hemoglobin is 10.2 watchful monitoring  Labs have been reviewed Echocardiogram 12/16/2017: EF normal 50 to 55% Herceptin and Perjeta will be on hold until Adriamycin and Cytoxan are complete Return to clinic in 2 weeks for cycle 4

## 2018-01-13 ENCOUNTER — Ambulatory Visit: Payer: BLUE CROSS/BLUE SHIELD | Admitting: Hematology and Oncology

## 2018-01-13 ENCOUNTER — Inpatient Hospital Stay: Payer: BLUE CROSS/BLUE SHIELD

## 2018-01-13 ENCOUNTER — Other Ambulatory Visit: Payer: BLUE CROSS/BLUE SHIELD

## 2018-01-13 ENCOUNTER — Ambulatory Visit: Payer: BLUE CROSS/BLUE SHIELD

## 2018-01-13 ENCOUNTER — Inpatient Hospital Stay (HOSPITAL_BASED_OUTPATIENT_CLINIC_OR_DEPARTMENT_OTHER): Payer: BLUE CROSS/BLUE SHIELD | Admitting: Hematology and Oncology

## 2018-01-13 DIAGNOSIS — Z79899 Other long term (current) drug therapy: Secondary | ICD-10-CM | POA: Diagnosis not present

## 2018-01-13 DIAGNOSIS — Z9013 Acquired absence of bilateral breasts and nipples: Secondary | ICD-10-CM

## 2018-01-13 DIAGNOSIS — C50411 Malignant neoplasm of upper-outer quadrant of right female breast: Secondary | ICD-10-CM

## 2018-01-13 DIAGNOSIS — Z95828 Presence of other vascular implants and grafts: Secondary | ICD-10-CM

## 2018-01-13 DIAGNOSIS — Z171 Estrogen receptor negative status [ER-]: Secondary | ICD-10-CM

## 2018-01-13 LAB — CBC WITH DIFFERENTIAL (CANCER CENTER ONLY)
BASOS ABS: 0 10*3/uL (ref 0.0–0.1)
Basophils Relative: 0 %
Eosinophils Absolute: 0 10*3/uL (ref 0.0–0.5)
Eosinophils Relative: 0 %
HEMATOCRIT: 31.6 % — AB (ref 34.8–46.6)
HEMOGLOBIN: 10 g/dL — AB (ref 11.6–15.9)
Lymphocytes Relative: 18 %
Lymphs Abs: 1.4 10*3/uL (ref 0.9–3.3)
MCH: 29.2 pg (ref 25.1–34.0)
MCHC: 31.6 g/dL (ref 31.5–36.0)
MCV: 92.4 fL (ref 79.5–101.0)
MONOS PCT: 13 %
Monocytes Absolute: 1 10*3/uL — ABNORMAL HIGH (ref 0.1–0.9)
NEUTROS ABS: 5.3 10*3/uL (ref 1.5–6.5)
NEUTROS PCT: 69 %
Platelet Count: 186 10*3/uL (ref 145–400)
RBC: 3.42 MIL/uL — ABNORMAL LOW (ref 3.70–5.45)
RDW: 17.2 % — ABNORMAL HIGH (ref 11.2–14.5)
WBC Count: 7.7 10*3/uL (ref 3.9–10.3)

## 2018-01-13 LAB — CMP (CANCER CENTER ONLY)
ALK PHOS: 83 U/L (ref 38–126)
ALT: 11 U/L (ref 0–44)
ANION GAP: 9 (ref 5–15)
AST: 10 U/L — ABNORMAL LOW (ref 15–41)
Albumin: 3.7 g/dL (ref 3.5–5.0)
BUN: 12 mg/dL (ref 6–20)
CALCIUM: 9.4 mg/dL (ref 8.9–10.3)
CO2: 28 mmol/L (ref 22–32)
Chloride: 105 mmol/L (ref 98–111)
Creatinine: 0.81 mg/dL (ref 0.44–1.00)
GFR, Estimated: >60 mL/min (ref >60)
Glucose, Bld: 136 mg/dL — ABNORMAL HIGH (ref 70–99)
POTASSIUM: 4.3 mmol/L (ref 3.5–5.1)
SODIUM: 142 mmol/L (ref 135–145)
TOTAL PROTEIN: 6.5 g/dL (ref 6.5–8.1)
Total Bilirubin: <0.2 mg/dL — ABNORMAL LOW (ref 0.3–1.2)

## 2018-01-13 MED ORDER — SODIUM CHLORIDE 0.9% FLUSH
10.0000 mL | INTRAVENOUS | Status: DC | PRN
  Administered 2018-01-13: 10 mL
  Filled 2018-01-13: qty 10

## 2018-01-13 MED ORDER — SODIUM CHLORIDE 0.9 % IV SOLN
Freq: Once | INTRAVENOUS | Status: AC
  Administered 2018-01-13: 10:00:00 via INTRAVENOUS
  Filled 2018-01-13: qty 250

## 2018-01-13 MED ORDER — SODIUM CHLORIDE 0.9 % IV SOLN
500.0000 mg/m2 | Freq: Once | INTRAVENOUS | Status: AC
  Administered 2018-01-13: 1120 mg via INTRAVENOUS
  Filled 2018-01-13: qty 56

## 2018-01-13 MED ORDER — PALONOSETRON HCL INJECTION 0.25 MG/5ML
0.2500 mg | Freq: Once | INTRAVENOUS | Status: AC
  Administered 2018-01-13: 0.25 mg via INTRAVENOUS

## 2018-01-13 MED ORDER — PALONOSETRON HCL INJECTION 0.25 MG/5ML
INTRAVENOUS | Status: AC
  Filled 2018-01-13: qty 5

## 2018-01-13 MED ORDER — DOXORUBICIN HCL CHEMO IV INJECTION 2 MG/ML
50.0000 mg/m2 | Freq: Once | INTRAVENOUS | Status: AC
  Administered 2018-01-13: 112 mg via INTRAVENOUS
  Filled 2018-01-13: qty 56

## 2018-01-13 MED ORDER — HEPARIN SOD (PORK) LOCK FLUSH 100 UNIT/ML IV SOLN
500.0000 [IU] | Freq: Once | INTRAVENOUS | Status: AC | PRN
  Administered 2018-01-13: 500 [IU]
  Filled 2018-01-13: qty 5

## 2018-01-13 MED ORDER — SODIUM CHLORIDE 0.9 % IV SOLN
Freq: Once | INTRAVENOUS | Status: AC
  Administered 2018-01-13: 10:00:00 via INTRAVENOUS
  Filled 2018-01-13: qty 5

## 2018-01-13 NOTE — Patient Instructions (Signed)
[facility] Discharge Instructions for Patients Receiving Chemotherapy  Today you received the following chemotherapy agents: Adriamycin, Cytoxan  To help prevent nausea and vomiting after your treatment, we encourage you to take your nausea medication as directed.   If you develop nausea and vomiting that is not controlled by your nausea medication, call the clinic.   BELOW ARE SYMPTOMS THAT SHOULD BE REPORTED IMMEDIATELY:  *FEVER GREATER THAN 100.5 F  *CHILLS WITH OR WITHOUT FEVER  NAUSEA AND VOMITING THAT IS NOT CONTROLLED WITH YOUR NAUSEA MEDICATION  *UNUSUAL SHORTNESS OF BREATH  *UNUSUAL BRUISING OR BLEEDING  TENDERNESS IN MOUTH AND THROAT WITH OR WITHOUT PRESENCE OF ULCERS  *URINARY PROBLEMS  *BOWEL PROBLEMS  UNUSUAL RASH Items with * indicate a potential emergency and should be followed up as soon as possible.  Feel free to call the clinic should you have any questions or concerns. The clinic phone number is [phone].  Please show the [facility] at check-in to the Emergency Department and triage nurse.  Doxorubicin injection What is this medicine? DOXORUBICIN (dox oh ROO bi sin) is a chemotherapy drug. It is used to treat many kinds of cancer like leukemia, lymphoma, neuroblastoma, sarcoma, and Wilms' tumor. It is also used to treat bladder cancer, breast cancer, lung cancer, ovarian cancer, stomach cancer, and thyroid cancer. This medicine may be used for other purposes; ask your health care provider or pharmacist if you have questions. COMMON BRAND NAME(S): Adriamycin, Adriamycin PFS, Adriamycin RDF, Rubex What should I tell my health care provider before I take this medicine? They need to know if you have any of these conditions: -heart disease -history of low blood counts caused by a medicine -liver disease -recent or ongoing radiation therapy -an unusual or allergic reaction to doxorubicin, other chemotherapy agents, other medicines,  foods, dyes, or preservatives -pregnant or trying to get pregnant -breast-feeding How should I use this medicine? This drug is given as an infusion into a vein. It is administered in a hospital or clinic by a specially trained health care professional. If you have pain, swelling, burning or any unusual feeling around the site of your injection, tell your health care professional right away. Talk to your pediatrician regarding the use of this medicine in children. Special care may be needed. Overdosage: If you think you have taken too much of this medicine contact a poison control center or emergency room at once. NOTE: This medicine is only for you. Do not share this medicine with others. What if I miss a dose? It is important not to miss your dose. Call your doctor or health care professional if you are unable to keep an appointment. What may interact with this medicine? This medicine may interact with the following medications: -6-mercaptopurine -paclitaxel -phenytoin -St. John's Wort -trastuzumab -verapamil This list may not describe all possible interactions. Give your health care provider a list of all the medicines, herbs, non-prescription drugs, or dietary supplements you use. Also tell them if you smoke, drink alcohol, or use illegal drugs. Some items may interact with your medicine. What should I watch for while using this medicine? This drug may make you feel generally unwell. This is not uncommon, as chemotherapy can affect healthy cells as well as cancer cells. Report any side effects. Continue your course of treatment even though you feel ill unless your doctor tells you to stop. There is a maximum amount of this medicine you should receive throughout your life. The amount depends on the  medical condition being treated and your overall health. Your doctor will watch how much of this medicine you receive in your lifetime. Tell your doctor if you have taken this medicine before. You  may need blood work done while you are taking this medicine. Your urine may turn red for a few days after your dose. This is not blood. If your urine is dark or brown, call your doctor. In some cases, you may be given additional medicines to help with side effects. Follow all directions for their use. Call your doctor or health care professional for advice if you get a fever, chills or sore throat, or other symptoms of a cold or flu. Do not treat yourself. This drug decreases your body's ability to fight infections. Try to avoid being around people who are sick. This medicine may increase your risk to bruise or bleed. Call your doctor or health care professional if you notice any unusual bleeding. Talk to your doctor about your risk of cancer. You may be more at risk for certain types of cancers if you take this medicine. Do not become pregnant while taking this medicine or for 6 months after stopping it. Women should inform their doctor if they wish to become pregnant or think they might be pregnant. Men should not father a child while taking this medicine and for 6 months after stopping it. There is a potential for serious side effects to an unborn child. Talk to your health care professional or pharmacist for more information. Do not breast-feed an infant while taking this medicine. This medicine has caused ovarian failure in some women and reduced sperm counts in some men This medicine may interfere with the ability to have a child. Talk with your doctor or health care professional if you are concerned about your fertility. What side effects may I notice from receiving this medicine? Side effects that you should report to your doctor or health care professional as soon as possible: -allergic reactions like skin rash, itching or hives, swelling of the face, lips, or tongue -breathing problems -chest pain -fast or irregular heartbeat -low blood counts - this medicine may decrease the number of white  blood cells, red blood cells and platelets. You may be at increased risk for infections and bleeding. -pain, redness, or irritation at site where injected -signs of infection - fever or chills, cough, sore throat, pain or difficulty passing urine -signs of decreased platelets or bleeding - bruising, pinpoint red spots on the skin, black, tarry stools, blood in the urine -swelling of the ankles, feet, hands -tiredness -weakness Side effects that usually do not require medical attention (report to your doctor or health care professional if they continue or are bothersome): -diarrhea -hair loss -mouth sores -nail discoloration or damage -nausea -red colored urine -vomiting This list may not describe all possible side effects. Call your doctor for medical advice about side effects. You may report side effects to FDA at 1-800-FDA-1088. Where should I keep my medicine? This drug is given in a hospital or clinic and will not be stored at home. NOTE: This sheet is a summary. It may not cover all possible information. If you have questions about this medicine, talk to your doctor, pharmacist, or health care provider.  2018 Elsevier/Gold Standard (2015-06-12 11:28:51)  Cyclophosphamide injection What is this medicine? CYCLOPHOSPHAMIDE (sye kloe FOSS fa mide) is a chemotherapy drug. It slows the growth of cancer cells. This medicine is used to treat many types of cancer like lymphoma, myeloma,  leukemia, breast cancer, and ovarian cancer, to name a few. This medicine may be used for other purposes; ask your health care provider or pharmacist if you have questions. COMMON BRAND NAME(S): Cytoxan, Neosar What should I tell my health care provider before I take this medicine? They need to know if you have any of these conditions: -blood disorders -history of other chemotherapy -infection -kidney disease -liver disease -recent or ongoing radiation therapy -tumors in the bone marrow -an unusual or  allergic reaction to cyclophosphamide, other chemotherapy, other medicines, foods, dyes, or preservatives -pregnant or trying to get pregnant -breast-feeding How should I use this medicine? This drug is usually given as an injection into a vein or muscle or by infusion into a vein. It is administered in a hospital or clinic by a specially trained health care professional. Talk to your pediatrician regarding the use of this medicine in children. Special care may be needed. Overdosage: If you think you have taken too much of this medicine contact a poison control center or emergency room at once. NOTE: This medicine is only for you. Do not share this medicine with others. What if I miss a dose? It is important not to miss your dose. Call your doctor or health care professional if you are unable to keep an appointment. What may interact with this medicine? This medicine may interact with the following medications: -amiodarone -amphotericin B -azathioprine -certain antiviral medicines for HIV or AIDS such as protease inhibitors (e.g., indinavir, ritonavir) and zidovudine -certain blood pressure medications such as benazepril, captopril, enalapril, fosinopril, lisinopril, moexipril, monopril, perindopril, quinapril, ramipril, trandolapril -certain cancer medications such as anthracyclines (e.g., daunorubicin, doxorubicin), busulfan, cytarabine, paclitaxel, pentostatin, tamoxifen, trastuzumab -certain diuretics such as chlorothiazide, chlorthalidone, hydrochlorothiazide, indapamide, metolazone -certain medicines that treat or prevent blood clots like warfarin -certain muscle relaxants such as succinylcholine -cyclosporine -etanercept -indomethacin -medicines to increase blood counts like filgrastim, pegfilgrastim, sargramostim -medicines used as general anesthesia -metronidazole -natalizumab This list may not describe all possible interactions. Give your health care provider a list of all the  medicines, herbs, non-prescription drugs, or dietary supplements you use. Also tell them if you smoke, drink alcohol, or use illegal drugs. Some items may interact with your medicine. What should I watch for while using this medicine? Visit your doctor for checks on your progress. This drug may make you feel generally unwell. This is not uncommon, as chemotherapy can affect healthy cells as well as cancer cells. Report any side effects. Continue your course of treatment even though you feel ill unless your doctor tells you to stop. Drink water or other fluids as directed. Urinate often, even at night. In some cases, you may be given additional medicines to help with side effects. Follow all directions for their use. Call your doctor or health care professional for advice if you get a fever, chills or sore throat, or other symptoms of a cold or flu. Do not treat yourself. This drug decreases your body's ability to fight infections. Try to avoid being around people who are sick. This medicine may increase your risk to bruise or bleed. Call your doctor or health care professional if you notice any unusual bleeding. Be careful brushing and flossing your teeth or using a toothpick because you may get an infection or bleed more easily. If you have any dental work done, tell your dentist you are receiving this medicine. You may get drowsy or dizzy. Do not drive, use machinery, or do anything that needs mental alertness until  you know how this medicine affects you. Do not become pregnant while taking this medicine or for 1 year after stopping it. Women should inform their doctor if they wish to become pregnant or think they might be pregnant. Men should not father a child while taking this medicine and for 4 months after stopping it. There is a potential for serious side effects to an unborn child. Talk to your health care professional or pharmacist for more information. Do not breast-feed an infant while taking  this medicine. This medicine may interfere with the ability to have a child. This medicine has caused ovarian failure in some women. This medicine has caused reduced sperm counts in some men. You should talk with your doctor or health care professional if you are concerned about your fertility. If you are going to have surgery, tell your doctor or health care professional that you have taken this medicine. What side effects may I notice from receiving this medicine? Side effects that you should report to your doctor or health care professional as soon as possible: -allergic reactions like skin rash, itching or hives, swelling of the face, lips, or tongue -low blood counts - this medicine may decrease the number of white blood cells, red blood cells and platelets. You may be at increased risk for infections and bleeding. -signs of infection - fever or chills, cough, sore throat, pain or difficulty passing urine -signs of decreased platelets or bleeding - bruising, pinpoint red spots on the skin, black, tarry stools, blood in the urine -signs of decreased red blood cells - unusually weak or tired, fainting spells, lightheadedness -breathing problems -dark urine -dizziness -palpitations -swelling of the ankles, feet, hands -trouble passing urine or change in the amount of urine -weight gain -yellowing of the eyes or skin Side effects that usually do not require medical attention (report to your doctor or health care professional if they continue or are bothersome): -changes in nail or skin color -hair loss -missed menstrual periods -mouth sores -nausea, vomiting This list may not describe all possible side effects. Call your doctor for medical advice about side effects. You may report side effects to FDA at 1-800-FDA-1088. Where should I keep my medicine? This drug is given in a hospital or clinic and will not be stored at home. NOTE: This sheet is a summary. It may not cover all possible  information. If you have questions about this medicine, talk to your doctor, pharmacist, or health care provider.  2018 Elsevier/Gold Standard (2012-02-28 16:22:58)

## 2018-01-13 NOTE — Progress Notes (Signed)
Patient Care Team: Deland Pretty, MD as PCP - General (Internal Medicine) Baxley, Cresenciano Lick, MD (Internal Medicine)  DIAGNOSIS:  Encounter Diagnosis  Name Primary?  . Malignant neoplasm of upper-outer quadrant of right breast in female, estrogen receptor negative ([facility])     SUMMARY OF ONCOLOGIC HISTORY:   Malignant neoplasm of upper-outer quadrant of right breast in female, estrogen receptor negative ([facility])   07/09/2017 Initial Diagnosis    Patient palpated a right breast mass which was evaluated by mammogram and ultrasound and a breast MRI which revealed 4.5 x 4 cm necrotic tumor with suspicious multiple right axillary lymph nodes; biopsy revealed IDC grade 3 ER 0%, PR 0%, HER-2 positive ratio 3.08, Ki-67 70%, T2NX stage IIa/IIb    07/18/2017 - 11/04/2017 Neo-Adjuvant Chemotherapy    TCH Perjeta x6 cycles followed by Herceptin Perjeta maintenance    08/15/2017 Genetic Testing    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  The Multi-Gene Panel offered by [facility] includes sequencing and/or deletion duplication testing of the following 83 genes: ALK, APC, ATM, AXIN2,BAP1,  BARD1, BLM, BMPR1A, BRCA1, BRCA2, BRIP1, CASR, CDC73, CDH1, CDK4, CDKN1B, CDKN1C, CDKN2A (p14ARF), CDKN2A (p16INK4a), CEBPA, CHEK2, CTNNA1, DICER1, DIS3L2, EGFR (c.2369C>T, p.Thr790Met variant only), EPCAM (Deletion/duplication testing only), FH, FLCN, GATA2, GPC3, GREM1 (Promoter region deletion/duplication testing only), HOXB13 (c.251G>A, p.Gly84Glu), HRAS, KIT, MAX, MEN1, MET, MITF (c.952G>A, p.Glu318Lys variant only), MLH1, MSH2, MSH3, MSH6, MUTYH, NBN, NF1, NF2, NTHL1, PALB2, PDGFRA, PHOX2B, PMS2, POLD1, POLE, POT1, PRKAR1A, PTCH1, PTEN, RAD50, RAD51C, RAD51D, RB1, RECQL4, RET, RUNX1, SDHAF2, SDHA (sequence changes only), SDHB, SDHC, SDHD, SMAD4, SMARCA4, SMARCB1, SMARCE1, STK11, SUFU, TERT, TERT, TMEM127, TP53, TSC1, TSC2, VHL, WRN and WT1.  The report date is August 15, 2017.    11/18/2017 Surgery    Bilateral mastectomies: Right mastectomy: IDC grade 3 3.8 cm margins negative, 0/4 lymph nodes negative, ER 0%, PR 0%, HER-2 negative ratio 1.3, IHC HER-2 negative; Ki-67 70%, RCB class II; left mastectomy: [facility]    12/18/2017 -  Chemotherapy    Adjuvant chemotherapy with dose dense Adriamycin and Cytoxan x4     CHIEF COMPLIANT: Cycle 3 adjuvant dose dense Adriamycin and Cytoxan  INTERVAL HISTORY: Rebecca [name] is a 47 year old with above-mentioned history of HER-2 positive breast cancer who underwent neoadjuvant chemotherapy and still did not get a great response after bilateral mastectomies.  She is currently on adjuvant chemotherapy and today is cycle 3 of dose dense Adriamycin and Cytoxan.  She is tolerating the treatment fairly well.  Is very mild nausea as well as aches and pains and fatigue.  She has had upper respiratory cold symptoms but denies any fevers or chills.  REVIEW OF SYSTEMS:   Constitutional: Denies fevers, chills or abnormal weight loss Eyes: Denies blurriness of vision Ears, nose, mouth, throat, and face: Denies mucositis or sore throat Respiratory: Cold symptoms Cardiovascular: Denies palpitation, chest discomfort Gastrointestinal:  Denies nausea, heartburn or change in bowel habits Skin: Denies abnormal skin rashes Lymphatics: Denies new lymphadenopathy or easy bruising Neurological:Denies numbness, tingling or new weaknesses Behavioral/Psych: Mood is stable, no new changes  Extremities: No lower extremity edema  All other systems were reviewed with the patient and are negative.  I have reviewed the past medical history, past surgical history, social history and family history with the patient and they are unchanged from previous note.  ALLERGIES:  has no allergies on file.  MEDICATIONS:  Current Outpatient Medications  Medication Sig Dispense Refill  . carvedilol (COREG) 3.125 MG tablet  Take 1 tablet (3.125 mg total) by mouth 2 (two) times daily. 60 tablet  3  . esomeprazole (NEXIUM) 40 MG capsule Nexium 40 mg capsule,delayed release  Take 1 capsule every day by oral route.    . gabapentin (NEURONTIN) 300 MG capsule Take 1 capsule (300 mg total) by mouth 3 (three) times daily. 90 capsule 1  . levothyroxine (SYNTHROID, LEVOTHROID) 137 MCG tablet Take 137 mcg by mouth daily before breakfast.    . lidocaine-prilocaine (EMLA) cream Apply to affected area once 30 g 3  . LORazepam (ATIVAN) 1 MG tablet Take 1 tablet (1 mg total) by mouth every 8 (eight) hours as needed (nausea and vomiting). 30 tablet 0  . losartan (COZAAR) 25 MG tablet Take 1 tablet (25 mg total) by mouth daily. 30 tablet 3  . metFORMIN (GLUCOPHAGE) 500 MG tablet Take by mouth daily with breakfast.    . methocarbamol (ROBAXIN) 500 MG tablet Take 1 tablet (500 mg total) by mouth every 8 (eight) hours as needed for muscle spasms. 30 tablet 0  . ondansetron (ZOFRAN) 8 MG tablet Take 1 tablet (8 mg total) by mouth 2 (two) times daily as needed. Start on the third day after chemotherapy. 30 tablet 1  . oxyCODONE (OXY IR/ROXICODONE) 5 MG immediate release tablet Take 1-2 tablets (5-10 mg total) by mouth every 4 (four) hours as needed for moderate pain. 40 tablet 0  . prochlorperazine (COMPAZINE) 10 MG tablet Take 1 tablet (10 mg total) by mouth every 6 (six) hours as needed (Nausea or vomiting). 30 tablet 1  . triamterene-hydrochlorothiazide (MAXZIDE) 75-50 MG tablet One po daily prn lower extremity edema 30 tablet 2   No current facility-administered medications for this visit.    Facility-Administered Medications Ordered in Other Visits  Medication Dose Route Frequency Provider Last Rate Last Dose  . LORazepam (ATIVAN) injection 1 mg  1 mg Intravenous Once Tanner, Van E., PA-C        PHYSICAL EXAMINATION: ECOG PERFORMANCE STATUS: 1 - Symptomatic but completely ambulatory  Vitals:   01/13/18 0859  BP: 114/84  Pulse: 75  Resp: 18  Temp: 98.2 F (36.8 C)  SpO2: 98%   Filed Weights     01/13/18 0859  Weight: 229 lb 6.4 oz (104.1 kg)    GENERAL:alert, no distress and comfortable SKIN: skin color, texture, turgor are normal, no rashes or significant lesions EYES: normal, Conjunctiva are pink and non-injected, sclera clear OROPHARYNX:no exudate, no erythema and lips, buccal mucosa, and tongue normal  NECK: supple, thyroid normal size, non-tender, without nodularity LYMPH:  no palpable lymphadenopathy in the cervical, axillary or inguinal LUNGS: clear to auscultation and percussion with normal breathing effort HEART: regular rate & rhythm and no murmurs and no lower extremity edema ABDOMEN:abdomen soft, non-tender and normal bowel sounds MUSCULOSKELETAL:no cyanosis of digits and no clubbing  NEURO: alert & oriented x 3 with fluent speech, no focal motor/sensory deficits EXTREMITIES: No lower extremity edema   LABORATORY DATA:  I have reviewed the data as listed CMP Latest Ref Rng & Units 12/30/2017 12/25/2017 12/18/2017  Glucose 70 - 99 mg/dL 108(H) 113(H) 106(H)  BUN 6 - 20 mg/dL 9 16 16  Creatinine 0.44 - 1.00 mg/dL 0.86 0.80 0.85  Sodium 135 - 145 mmol/L 142 140 140  Potassium 3.5 - 5.1 mmol/L 4.2 4.2 4.4  Chloride 98 - 111 mmol/L 106 104 106  CO2 22 - 32 mmol/L 27 26 26  Calcium 8.9 - 10.3 mg/dL 9.1 9.3 9.2  Total   Protein 6.5 - 8.1 g/dL 6.0(L) 6.8 6.6  Total Bilirubin 0.3 - 1.2 mg/dL <0.2(L) 0.3 <0.2(L)  Alkaline Phos 38 - 126 U/L 76 90 67  AST 15 - 41 U/L 12(L) 9(L) 14(L)  ALT 0 - 44 U/L 15 12 13    Lab Results  Component Value Date   WBC 7.7 01/13/2018   HGB 10.0 (L) 01/13/2018   HCT 31.6 (L) 01/13/2018   MCV 92.4 01/13/2018   PLT 186 01/13/2018   NEUTROABS 5.3 01/13/2018    ASSESSMENT & PLAN:  Malignant neoplasm of upper-outer quadrant of right breast in female, estrogen receptor negative ([facility]) 07/09/2017:Patient palpated a right breast mass which was evaluated by mammogram and ultrasound and a breast MRI which revealed 4.5 x 4 cm necrotic tumor with  suspicious multiple right axillary lymph nodes; biopsy revealed IDC grade 3 ER 0%, PR 0%, HER-2 positive ratio 3.08, Ki-67 70%, T2NX stage IIa/IIb  Treatment planbased on multidisciplinary tumor board: 1. Neoadjuvant chemotherapy with TCH Perjeta 6 cycles followed by Herceptinand Perjetamaintenance for 1 year 2. Followed bybilateral mastectomies with targeted node dissection on the right7/23/2019 3.Due to significant residual disease, additional adjuvant chemo with Adriamycin and Cytoxan starting 12/18/2017 4. Followed by adjuvant radiation therapy ----------------------------------------------------------------------------- 11/18/2017:Bilateral mastectomies: Right mastectomy: IDC grade 3 3.8 cm margins negative, 0/4 lymph nodes negative, ER 0%, PR 0%, HER-2 negative ratio 1.3, IHC HER-2 negative; Ki-67 70%, RCB class II; left mastectomy: [facility]  Current treatment: Cycle 3 day1dose dense Adriamycin and Cytoxan Chemo toxicities: 1.Mild fatigue 2.mild nausea 3.Leg cramps: Encouraged her to drink more water 4.  Chemotherapy-induced anemia: Hemoglobin is 10.2 watchful monitoring  Labs have been reviewed Echocardiogram 12/16/2017: EF normal 50 to 55% Herceptin and Perjeta will be on hold until Adriamycin and Cytoxan are complete Return to clinic in 2 weeks for cycle 4 Patient will start Herceptin and Perjeta on 02/15/2018 every 3 weeks   No orders of the defined types were placed in this encounter.  The patient has a good understanding of the overall plan. she agrees with it. she will call with any problems that may develop before the next visit here.   Viinay K Gudena, MD 01/13/18    

## 2018-01-15 ENCOUNTER — Other Ambulatory Visit: Payer: BLUE CROSS/BLUE SHIELD

## 2018-01-15 ENCOUNTER — Ambulatory Visit: Payer: BLUE CROSS/BLUE SHIELD

## 2018-01-15 ENCOUNTER — Inpatient Hospital Stay: Payer: BLUE CROSS/BLUE SHIELD

## 2018-01-15 ENCOUNTER — Ambulatory Visit: Payer: BLUE CROSS/BLUE SHIELD | Admitting: Hematology and Oncology

## 2018-01-15 VITALS — BP 123/80 | HR 74 | Temp 99.0°F | Resp 20

## 2018-01-15 DIAGNOSIS — Z171 Estrogen receptor negative status [ER-]: Principal | ICD-10-CM

## 2018-01-15 DIAGNOSIS — C50411 Malignant neoplasm of upper-outer quadrant of right female breast: Secondary | ICD-10-CM

## 2018-01-15 MED ORDER — PEGFILGRASTIM-CBQV 6 MG/0.6ML ~~LOC~~ SOSY
6.0000 mg | PREFILLED_SYRINGE | Freq: Once | SUBCUTANEOUS | Status: AC
  Administered 2018-01-15: 6 mg via SUBCUTANEOUS

## 2018-01-15 NOTE — Patient Instructions (Signed)
Pegfilgrastim injection What is this medicine? PEGFILGRASTIM (PEG fil gra stim) is a long-acting granulocyte colony-stimulating factor that stimulates the growth of neutrophils, a type of white blood cell important in the body's fight against infection. It is used to reduce the incidence of fever and infection in patients with certain types of cancer who are receiving chemotherapy that affects the bone marrow, and to increase survival after being exposed to high doses of radiation. This medicine may be used for other purposes; ask your health care provider or pharmacist if you have questions. COMMON BRAND NAME(S): Neulasta What should I tell my health care provider before I take this medicine? They need to know if you have any of these conditions: -kidney disease -latex allergy -ongoing radiation therapy -sickle cell disease -skin reactions to acrylic adhesives (On-Body Injector only) -an unusual or allergic reaction to pegfilgrastim, filgrastim, other medicines, foods, dyes, or preservatives -pregnant or trying to get pregnant -breast-feeding How should I use this medicine? This medicine is for injection under the skin. If you get this medicine at home, you will be taught how to prepare and give the pre-filled syringe or how to use the On-body Injector. Refer to the patient Instructions for Use for detailed instructions. Use exactly as directed. Tell your healthcare provider immediately if you suspect that the On-body Injector may not have performed as intended or if you suspect the use of the On-body Injector resulted in a missed or partial dose. It is important that you put your used needles and syringes in a special sharps container. Do not put them in a trash can. If you do not have a sharps container, call your pharmacist or healthcare provider to get one. Talk to your pediatrician regarding the use of this medicine in children. While this drug may be prescribed for selected conditions,  precautions do apply. Overdosage: If you think you have taken too much of this medicine contact a poison control center or emergency room at once. NOTE: This medicine is only for you. Do not share this medicine with others. What if I miss a dose? It is important not to miss your dose. Call your doctor or health care professional if you miss your dose. If you miss a dose due to an On-body Injector failure or leakage, a new dose should be administered as soon as possible using a single prefilled syringe for manual use. What may interact with this medicine? Interactions have not been studied. Give your health care provider a list of all the medicines, herbs, non-prescription drugs, or dietary supplements you use. Also tell them if you smoke, drink alcohol, or use illegal drugs. Some items may interact with your medicine. This list may not describe all possible interactions. Give your health care provider a list of all the medicines, herbs, non-prescription drugs, or dietary supplements you use. Also tell them if you smoke, drink alcohol, or use illegal drugs. Some items may interact with your medicine. What should I watch for while using this medicine? You may need blood work done while you are taking this medicine. If you are going to need a MRI, CT scan, or other procedure, tell your doctor that you are using this medicine (On-Body Injector only). What side effects may I notice from receiving this medicine? Side effects that you should report to your doctor or health care professional as soon as possible: -allergic reactions like skin rash, itching or hives, swelling of the face, lips, or tongue -dizziness -fever -pain, redness, or irritation at site   where injected -pinpoint red spots on the skin -red or dark-brown urine -shortness of breath or breathing problems -stomach or side pain, or pain at the shoulder -swelling -tiredness -trouble passing urine or change in the amount of urine Side  effects that usually do not require medical attention (report to your doctor or health care professional if they continue or are bothersome): -bone pain -muscle pain This list may not describe all possible side effects. Call your doctor for medical advice about side effects. You may report side effects to FDA at 1-800-FDA-1088. Where should I keep my medicine? Keep out of the reach of children. Store pre-filled syringes in a refrigerator between 2 and 8 degrees C (36 and 46 degrees F). Do not freeze. Keep in carton to protect from light. Throw away this medicine if it is left out of the refrigerator for more than 48 hours. Throw away any unused medicine after the expiration date. NOTE: This sheet is a summary. It may not cover all possible information. If you have questions about this medicine, talk to your doctor, pharmacist, or health care provider.  2018 [employer] (2016-04-11 12:58:03)  

## 2018-01-17 ENCOUNTER — Ambulatory Visit: Payer: BLUE CROSS/BLUE SHIELD

## 2018-01-19 ENCOUNTER — Encounter (HOSPITAL_COMMUNITY): Payer: BLUE CROSS/BLUE SHIELD | Admitting: Internal Medicine

## 2018-01-19 ENCOUNTER — Other Ambulatory Visit (HOSPITAL_COMMUNITY): Payer: BLUE CROSS/BLUE SHIELD

## 2018-01-20 ENCOUNTER — Other Ambulatory Visit (HOSPITAL_COMMUNITY): Payer: Self-pay | Admitting: Internal Medicine

## 2018-01-27 ENCOUNTER — Encounter: Payer: Self-pay | Admitting: *Deleted

## 2018-01-27 ENCOUNTER — Other Ambulatory Visit: Payer: BLUE CROSS/BLUE SHIELD

## 2018-01-27 ENCOUNTER — Inpatient Hospital Stay: Payer: BLUE CROSS/BLUE SHIELD

## 2018-01-27 ENCOUNTER — Ambulatory Visit: Payer: BLUE CROSS/BLUE SHIELD

## 2018-01-27 ENCOUNTER — Ambulatory Visit: Payer: BLUE CROSS/BLUE SHIELD | Admitting: Hematology and Oncology

## 2018-01-27 ENCOUNTER — Inpatient Hospital Stay (HOSPITAL_BASED_OUTPATIENT_CLINIC_OR_DEPARTMENT_OTHER): Payer: BLUE CROSS/BLUE SHIELD | Admitting: Hematology and Oncology

## 2018-01-27 ENCOUNTER — Inpatient Hospital Stay: Payer: BLUE CROSS/BLUE SHIELD | Attending: Hematology and Oncology

## 2018-01-27 DIAGNOSIS — C50411 Malignant neoplasm of upper-outer quadrant of right female breast: Secondary | ICD-10-CM

## 2018-01-27 DIAGNOSIS — Z9013 Acquired absence of bilateral breasts and nipples: Secondary | ICD-10-CM | POA: Diagnosis not present

## 2018-01-27 DIAGNOSIS — Z171 Estrogen receptor negative status [ER-]: Secondary | ICD-10-CM | POA: Insufficient documentation

## 2018-01-27 DIAGNOSIS — Z7689 Persons encountering health services in other specified circumstances: Secondary | ICD-10-CM | POA: Diagnosis not present

## 2018-01-27 DIAGNOSIS — Z9221 Personal history of antineoplastic chemotherapy: Secondary | ICD-10-CM

## 2018-01-27 DIAGNOSIS — Z79899 Other long term (current) drug therapy: Secondary | ICD-10-CM

## 2018-01-27 DIAGNOSIS — Z5112 Encounter for antineoplastic immunotherapy: Secondary | ICD-10-CM | POA: Insufficient documentation

## 2018-01-27 LAB — CBC WITH DIFFERENTIAL (CANCER CENTER ONLY)
BASOS ABS: 0 10*3/uL (ref 0.0–0.1)
BASOS PCT: 1 %
Eosinophils Absolute: 0 10*3/uL (ref 0.0–0.5)
Eosinophils Relative: 0 %
HCT: 32.7 % — ABNORMAL LOW (ref 34.8–46.6)
HEMOGLOBIN: 10.3 g/dL — AB (ref 11.6–15.9)
LYMPHS ABS: 1.2 10*3/uL (ref 0.9–3.3)
Lymphocytes Relative: 18 %
MCH: 29.1 pg (ref 25.1–34.0)
MCHC: 31.5 g/dL (ref 31.5–36.0)
MCV: 92.4 fL (ref 79.5–101.0)
Monocytes Absolute: 0.9 10*3/uL (ref 0.1–0.9)
Monocytes Relative: 14 %
NEUTROS PCT: 67 %
Neutro Abs: 4.6 10*3/uL (ref 1.5–6.5)
Platelet Count: 219 10*3/uL (ref 145–400)
RBC: 3.54 MIL/uL — AB (ref 3.70–5.45)
RDW: 18.5 % — ABNORMAL HIGH (ref 11.2–14.5)
WBC: 6.8 10*3/uL (ref 3.9–10.3)

## 2018-01-27 LAB — CMP (CANCER CENTER ONLY)
ALBUMIN: 3.8 g/dL (ref 3.5–5.0)
ALK PHOS: 79 U/L (ref 38–126)
ALT: 15 U/L (ref 0–44)
AST: 14 U/L — AB (ref 15–41)
Anion gap: 9 (ref 5–15)
BUN: 12 mg/dL (ref 6–20)
CALCIUM: 9.3 mg/dL (ref 8.9–10.3)
CO2: 27 mmol/L (ref 22–32)
CREATININE: 0.79 mg/dL (ref 0.44–1.00)
Chloride: 106 mmol/L (ref 98–111)
GFR, Est AFR Am: >60 mL/min (ref >60)
GFR, Estimated: >60 mL/min (ref >60)
GLUCOSE: 144 mg/dL — AB (ref 70–99)
Potassium: 4.3 mmol/L (ref 3.5–5.1)
Sodium: 142 mmol/L (ref 135–145)
Total Bilirubin: 0.3 mg/dL (ref 0.3–1.2)
Total Protein: 6.7 g/dL (ref 6.5–8.1)

## 2018-01-27 MED ORDER — PALONOSETRON HCL INJECTION 0.25 MG/5ML
0.2500 mg | Freq: Once | INTRAVENOUS | Status: AC
  Administered 2018-01-27: 0.25 mg via INTRAVENOUS

## 2018-01-27 MED ORDER — SODIUM CHLORIDE 0.9% FLUSH
10.0000 mL | INTRAVENOUS | Status: DC | PRN
  Administered 2018-01-27: 10 mL
  Filled 2018-01-27: qty 10

## 2018-01-27 MED ORDER — PALONOSETRON HCL INJECTION 0.25 MG/5ML
INTRAVENOUS | Status: AC
  Filled 2018-01-27: qty 5

## 2018-01-27 MED ORDER — SODIUM CHLORIDE 0.9 % IV SOLN
Freq: Once | INTRAVENOUS | Status: AC
  Administered 2018-01-27: 10:00:00 via INTRAVENOUS
  Filled 2018-01-27: qty 250

## 2018-01-27 MED ORDER — SODIUM CHLORIDE 0.9 % IV SOLN
Freq: Once | INTRAVENOUS | Status: AC
  Administered 2018-01-27: 10:00:00 via INTRAVENOUS
  Filled 2018-01-27: qty 5

## 2018-01-27 MED ORDER — SODIUM CHLORIDE 0.9 % IV SOLN
500.0000 mg/m2 | Freq: Once | INTRAVENOUS | Status: AC
  Administered 2018-01-27: 1120 mg via INTRAVENOUS
  Filled 2018-01-27: qty 56

## 2018-01-27 MED ORDER — HEPARIN SOD (PORK) LOCK FLUSH 100 UNIT/ML IV SOLN
500.0000 [IU] | Freq: Once | INTRAVENOUS | Status: AC | PRN
  Administered 2018-01-27: 500 [IU]
  Filled 2018-01-27: qty 5

## 2018-01-27 MED ORDER — DOXORUBICIN HCL CHEMO IV INJECTION 2 MG/ML
50.0000 mg/m2 | Freq: Once | INTRAVENOUS | Status: AC
  Administered 2018-01-27: 112 mg via INTRAVENOUS
  Filled 2018-01-27: qty 56

## 2018-01-27 NOTE — Patient Instructions (Signed)
[facility] Discharge Instructions for Patients Receiving Chemotherapy  Today you received the following chemotherapy agents Doxorubicin (Adriamycin) & Cyclophosphamide (Cytoxan).  To help prevent nausea and vomiting after your treatment, we encourage you to take your nausea medication as prescribed.  If you develop nausea and vomiting that is not controlled by your nausea medication, call the clinic.   BELOW ARE SYMPTOMS THAT SHOULD BE REPORTED IMMEDIATELY:  *FEVER GREATER THAN 100.5 F  *CHILLS WITH OR WITHOUT FEVER  NAUSEA AND VOMITING THAT IS NOT CONTROLLED WITH YOUR NAUSEA MEDICATION  *UNUSUAL SHORTNESS OF BREATH  *UNUSUAL BRUISING OR BLEEDING  TENDERNESS IN MOUTH AND THROAT WITH OR WITHOUT PRESENCE OF ULCERS  *URINARY PROBLEMS  *BOWEL PROBLEMS  UNUSUAL RASH Items with * indicate a potential emergency and should be followed up as soon as possible.  Feel free to call the clinic should you have any questions or concerns. The clinic phone number is [phone].  Please show the CHEMO ALERT CARD at check-in to the Emergency Department and triage nurse.   

## 2018-01-27 NOTE — Assessment & Plan Note (Signed)
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 Neoadjuvant chemotherapy with TCH Perjeta 6 cycles followed by Herceptinand Perjetamaintenance for 1 year 2. Followed bybilateral mastectomies with targeted node dissection on the right7/23/2019 3.Due to significant residual disease, additional adjuvant chemo with Adriamycin and Cytoxan starting 12/18/2017 4. Followed by adjuvant radiation therapy ----------------------------------------------------------------------------- 11/18/2017:Bilateral mastectomies: Right mastectomy: IDC grade 3 3.8 cm margins negative, 0/4 lymph nodes negative, ER 0%, PR 0%, HER-2 negative ratio 1.3, IHC HER-2 negative; Ki-67 70%, RCB class II; left mastectomy: [facility]  Current treatment: Cycle4dose dense Adriamycin and Cytoxan Chemo toxicities: 1.Mild fatigue 2.mild nausea 3.Leg cramps: Encouraged her to drink more water 4.Chemotherapy-induced anemia: Hemoglobin is 10.2 watchful monitoring  Labs have been reviewed Echocardiogram 12/16/2017: EF normal 50 to 55% Herceptin and Perjeta have been on hold until Adriamycin and Cytoxan are complete  Patient will start Herceptin and Perjeta on 02/15/2018 every 3 weeks

## 2018-01-27 NOTE — Progress Notes (Signed)
Patient Care Team: Deland Pretty, MD as PCP - General (Internal Medicine) Baxley, Cresenciano Lick, MD (Internal Medicine)  DIAGNOSIS:  Encounter Diagnosis  Name Primary?  . Malignant neoplasm of upper-outer quadrant of right breast in female, estrogen receptor negative ([place])     SUMMARY OF ONCOLOGIC HISTORY:   Malignant neoplasm of upper-outer quadrant of right breast in female, estrogen receptor negative ([place])   07/09/2017 Initial Diagnosis    Patient palpated a right breast mass which was evaluated by mammogram and ultrasound and a breast MRI which revealed 4.5 x 4 cm necrotic tumor with suspicious multiple right axillary lymph nodes; biopsy revealed IDC grade 3 ER 0%, PR 0%, HER-2 positive ratio 3.08, Ki-67 70%, T2NX stage IIa/IIb    07/18/2017 - 11/04/2017 Neo-Adjuvant Chemotherapy    TCH Perjeta x6 cycles followed by Herceptin Perjeta maintenance    08/15/2017 Genetic Testing    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  The Multi-Gene Panel offered by [facility] includes sequencing and/or deletion duplication testing of the following 83 genes: ALK, APC, ATM, AXIN2,BAP1,  BARD1, BLM, BMPR1A, BRCA1, BRCA2, BRIP1, CASR, CDC73, CDH1, CDK4, CDKN1B, CDKN1C, CDKN2A (p14ARF), CDKN2A (p16INK4a), CEBPA, CHEK2, CTNNA1, DICER1, DIS3L2, EGFR (c.2369C>T, p.Thr790Met variant only), EPCAM (Deletion/duplication testing only), FH, FLCN, GATA2, GPC3, GREM1 (Promoter region deletion/duplication testing only), HOXB13 (c.251G>A, p.Gly84Glu), HRAS, KIT, MAX, MEN1, MET, MITF (c.952G>A, p.Glu318Lys variant only), MLH1, MSH2, MSH3, MSH6, MUTYH, NBN, NF1, NF2, NTHL1, PALB2, PDGFRA, PHOX2B, PMS2, POLD1, POLE, POT1, PRKAR1A, PTCH1, PTEN, RAD50, RAD51C, RAD51D, RB1, RECQL4, RET, RUNX1, SDHAF2, SDHA (sequence changes only), SDHB, SDHC, SDHD, SMAD4, SMARCA4, SMARCB1, SMARCE1, STK11, SUFU, TERT, TERT, TMEM127, TP53, TSC1, TSC2, VHL, WRN and WT1.  The report date is August 15, 2017.    11/18/2017 Surgery    Bilateral mastectomies: Right mastectomy: IDC grade 3 3.8 cm margins negative, 0/4 lymph nodes negative, ER 0%, PR 0%, HER-2 negative ratio 1.3, IHC HER-2 negative; Ki-67 70%, RCB class II; left mastectomy: [facility]    12/18/2017 -  Chemotherapy    Adjuvant chemotherapy with dose dense Adriamycin and Cytoxan x4     CHIEF COMPLIANT: Last and final cycle of dose dense Adriamycin and Cytoxan  INTERVAL HISTORY: JUNICE FEI is a 47 year old with above-mentioned history of HER-2 positive breast cancer who had residual disease after initial chemotherapy that was triple negative and she received adjuvant chemo with dose dense Adriamycin and Cytoxan.  Today is cycle 4 of the treatment.  She will then begin every 3-week Herceptin Perjeta in 3 weeks from now.  After the last cycle her only major complaint is fatigue as well as some abdominal cramps and diarrhea.  She is more anxious since this is her last of her chemo treatments.  REVIEW OF SYSTEMS:   Constitutional: Denies fevers, chills or abnormal weight loss Eyes: Denies blurriness of vision Ears, nose, mouth, throat, and face: Denies mucositis or sore throat Respiratory: Denies cough, dyspnea or wheezes Cardiovascular: Denies palpitation, chest discomfort Gastrointestinal:  Denies nausea, heartburn or change in bowel habits Skin: Denies abnormal skin rashes Lymphatics: Denies new lymphadenopathy or easy bruising Neurological:Denies numbness, tingling or new weaknesses Behavioral/Psych: Mood is stable, no new changes  Extremities: No lower extremity edema   All other systems were reviewed with the patient and are negative.  I have reviewed the past medical history, past surgical history, social history and family history with the patient and they are unchanged from previous note.  ALLERGIES:  has no allergies on file.  MEDICATIONS:  Current Outpatient Medications  Medication Sig Dispense Refill  . carvedilol (COREG) 3.125 MG tablet Take 1  tablet (3.125 mg total) by mouth 2 (two) times daily. 60 tablet 3  . esomeprazole (NEXIUM) 40 MG capsule Nexium 40 mg capsule,delayed release  Take 1 capsule every day by oral route.    . gabapentin (NEURONTIN) 300 MG capsule Take 1 capsule (300 mg total) by mouth 3 (three) times daily. 90 capsule 1  . levothyroxine (SYNTHROID, LEVOTHROID) 137 MCG tablet Take 137 mcg by mouth daily before breakfast.    . lidocaine-prilocaine (EMLA) cream Apply to affected area once 30 g 3  . LORazepam (ATIVAN) 1 MG tablet Take 1 tablet (1 mg total) by mouth every 8 (eight) hours as needed (nausea and vomiting). 30 tablet 0  . losartan (COZAAR) 25 MG tablet TAKE 1 TABLET BY MOUTH EVERY DAY 30 tablet 5  . metFORMIN (GLUCOPHAGE) 500 MG tablet Take by mouth daily with breakfast.    . methocarbamol (ROBAXIN) 500 MG tablet Take 1 tablet (500 mg total) by mouth every 8 (eight) hours as needed for muscle spasms. 30 tablet 0  . ondansetron (ZOFRAN) 8 MG tablet Take 1 tablet (8 mg total) by mouth 2 (two) times daily as needed. Start on the third day after chemotherapy. 30 tablet 1  . oxyCODONE (OXY IR/ROXICODONE) 5 MG immediate release tablet Take 1-2 tablets (5-10 mg total) by mouth every 4 (four) hours as needed for moderate pain. 40 tablet 0  . prochlorperazine (COMPAZINE) 10 MG tablet Take 1 tablet (10 mg total) by mouth every 6 (six) hours as needed (Nausea or vomiting). 30 tablet 1  . triamterene-hydrochlorothiazide (MAXZIDE) 75-50 MG tablet One po daily prn lower extremity edema 30 tablet 2   No current facility-administered medications for this visit.    Facility-Administered Medications Ordered in Other Visits  Medication Dose Route Frequency Provider Last Rate Last Dose  . LORazepam (ATIVAN) injection 1 mg  1 mg Intravenous Once Harle Stanford., PA-C        PHYSICAL EXAMINATION: ECOG PERFORMANCE STATUS: 1 - Symptomatic but completely ambulatory  Vitals:   01/27/18 0907  BP: 125/90  Pulse: 77  Resp: 18    Temp: 98.1 F (36.7 C)  SpO2: 99%   Filed Weights   01/27/18 0907  Weight: 231 lb (104.8 kg)    GENERAL:alert, no distress and comfortable SKIN: skin color, texture, turgor are normal, no rashes or significant lesions EYES: normal, Conjunctiva are pink and non-injected, sclera clear OROPHARYNX:no exudate, no erythema and lips, buccal mucosa, and tongue normal  NECK: supple, thyroid normal size, non-tender, without nodularity LYMPH:  no palpable lymphadenopathy in the cervical, axillary or inguinal LUNGS: clear to auscultation and percussion with normal breathing effort HEART: regular rate & rhythm and no murmurs and no lower extremity edema ABDOMEN:abdomen soft, non-tender and normal bowel sounds MUSCULOSKELETAL:no cyanosis of digits and no clubbing  NEURO: alert & oriented x 3 with fluent speech, no focal motor/sensory deficits EXTREMITIES: No lower extremity edema   LABORATORY DATA:  I have reviewed the data as listed CMP Latest Ref Rng & Units 01/27/2018 01/13/2018 12/30/2017  Glucose 70 - 99 mg/dL 144(H) 136(H) 108(H)  BUN 6 - 20 mg/dL _0 Creatinine 0.44 - 1.00 mg/dL 0.79 0.81 0.86  Sodium 135 - 145 mmol/L 142 142 142  Potassium 3.5 - 5.1 mmol/L 4.3 4.3 4.2  Chloride 98 - 111 mmol/L 106 105 106  CO2 22 - 32 mmol/L 27 28 27  Calcium 8.9 - 10.3 mg/dL 9.3 9.4 9.1  Total Protein 6.5 - 8.1 g/dL 6.7 6.5 6.0(L)  Total Bilirubin 0.3 - 1.2 mg/dL 0.3 <0.2(L) <0.2(L)  Alkaline Phos 38 - 126 U/L 79 83 76  AST 15 - 41 U/L 14(L) 10(L) 12(L)  ALT 0 - 44 U/L _0 Lab Results  Component Value Date   WBC 6.8 01/27/2018   HGB 10.3 (L) 01/27/2018   HCT 32.7 (L) 01/27/2018   MCV 92.4 01/27/2018   PLT 219 01/27/2018   NEUTROABS 4.6 01/27/2018    ASSESSMENT & PLAN:  Malignant neoplasm of upper-outer quadrant of right breast in female, estrogen receptor negative ([place]) 07/09/2017:Patient palpated a right breast mass which was evaluated by mammogram and ultrasound and a breast  MRI which revealed 4.5 x 4 cm necrotic tumor with suspicious multiple right axillary lymph nodes; biopsy revealed IDC grade 3 ER 0%, PR 0%, HER-2 positive ratio 3.08, Ki-67 70%, T2NX stage IIa/IIb  Treatment planbased on multidisciplinary tumor board: 1. Neoadjuvant chemotherapy with TCH Perjeta 6 cycles followed by Herceptinand Perjetamaintenance for 1 year 2. Followed bybilateral mastectomies with targeted node dissection on the right7/23/2019 3.Due to significant residual disease, additional adjuvant chemo with Adriamycin and Cytoxan starting 12/18/2017 4. Followed by adjuvant radiation therapy ----------------------------------------------------------------------------- 11/18/2017:Bilateral mastectomies: Right mastectomy: IDC grade 3 3.8 cm margins negative, 0/4 lymph nodes negative, ER 0%, PR 0%, HER-2 negative ratio 1.3, IHC HER-2 negative; Ki-67 70%, RCB class II; left mastectomy: [facility]  Current treatment: Cycle4dose dense Adriamycin and Cytoxan Chemo toxicities: 1.Mild fatigue 2.mild nausea 3.Leg cramps: Encouraged her to drink more water 4.Chemotherapy-induced anemia: Hemoglobin is 10.2 watchful monitoring  Labs have been reviewed Echocardiogram 12/16/2017: EF normal 50 to 55% Herceptin and Perjeta have been on hold until Adriamycin and Cytoxan are complete  Patient will start Herceptin and Perjeta on 02/15/2018 every 3 weeks    No orders of the defined types were placed in this encounter.  The patient has a good understanding of the overall plan. she agrees with it. she will call with any problems that may develop before the next visit here.   Harriette Ohara, MD 01/27/18

## 2018-01-29 ENCOUNTER — Ambulatory Visit: Payer: BLUE CROSS/BLUE SHIELD

## 2018-01-29 ENCOUNTER — Other Ambulatory Visit: Payer: BLUE CROSS/BLUE SHIELD

## 2018-01-29 ENCOUNTER — Inpatient Hospital Stay: Payer: BLUE CROSS/BLUE SHIELD

## 2018-01-29 ENCOUNTER — Ambulatory Visit: Payer: BLUE CROSS/BLUE SHIELD | Admitting: Hematology and Oncology

## 2018-01-29 DIAGNOSIS — C50411 Malignant neoplasm of upper-outer quadrant of right female breast: Secondary | ICD-10-CM

## 2018-01-29 DIAGNOSIS — Z171 Estrogen receptor negative status [ER-]: Principal | ICD-10-CM

## 2018-01-29 MED ORDER — PEGFILGRASTIM-CBQV 6 MG/0.6ML ~~LOC~~ SOSY
6.0000 mg | PREFILLED_SYRINGE | Freq: Once | SUBCUTANEOUS | Status: AC
  Administered 2018-01-29: 6 mg via SUBCUTANEOUS

## 2018-01-29 MED ORDER — PEGFILGRASTIM-CBQV 6 MG/0.6ML ~~LOC~~ SOSY
PREFILLED_SYRINGE | SUBCUTANEOUS | Status: AC
  Filled 2018-01-29: qty 0.6

## 2018-01-31 ENCOUNTER — Ambulatory Visit: Payer: BLUE CROSS/BLUE SHIELD

## 2018-02-13 ENCOUNTER — Encounter (HOSPITAL_COMMUNITY): Payer: Self-pay | Admitting: Internal Medicine

## 2018-02-13 ENCOUNTER — Ambulatory Visit (HOSPITAL_COMMUNITY)
Admission: RE | Admit: 2018-02-13 | Discharge: 2018-02-13 | Disposition: A | Discharge status: Home / Self Care | Payer: BLUE CROSS/BLUE SHIELD | Source: Ambulatory Visit | Attending: Cardiology | Admitting: Cardiology

## 2018-02-13 ENCOUNTER — Other Ambulatory Visit: Payer: Self-pay

## 2018-02-13 ENCOUNTER — Ambulatory Visit (HOSPITAL_BASED_OUTPATIENT_CLINIC_OR_DEPARTMENT_OTHER)
Admission: RE | Admit: 2018-02-13 | Discharge: 2018-02-13 | Disposition: A | Discharge status: Home / Self Care | Payer: BLUE CROSS/BLUE SHIELD | Source: Ambulatory Visit | Attending: Internal Medicine | Admitting: Internal Medicine

## 2018-02-13 VITALS — BP 129/74 | HR 79 | Wt 229.8 lb

## 2018-02-13 DIAGNOSIS — E119 Type 2 diabetes mellitus without complications: Secondary | ICD-10-CM | POA: Diagnosis not present

## 2018-02-13 DIAGNOSIS — Z807 Family history of other malignant neoplasms of lymphoid, hematopoietic and related tissues: Secondary | ICD-10-CM | POA: Diagnosis not present

## 2018-02-13 DIAGNOSIS — Z79899 Other long term (current) drug therapy: Secondary | ICD-10-CM | POA: Diagnosis not present

## 2018-02-13 DIAGNOSIS — D509 Iron deficiency anemia, unspecified: Secondary | ICD-10-CM | POA: Insufficient documentation

## 2018-02-13 DIAGNOSIS — Z7984 Long term (current) use of oral hypoglycemic drugs: Secondary | ICD-10-CM | POA: Insufficient documentation

## 2018-02-13 DIAGNOSIS — C50411 Malignant neoplasm of upper-outer quadrant of right female breast: Secondary | ICD-10-CM | POA: Diagnosis present

## 2018-02-13 DIAGNOSIS — Z171 Estrogen receptor negative status [ER-]: Secondary | ICD-10-CM | POA: Insufficient documentation

## 2018-02-13 DIAGNOSIS — K219 Gastro-esophageal reflux disease without esophagitis: Secondary | ICD-10-CM | POA: Insufficient documentation

## 2018-02-13 DIAGNOSIS — Z79890 Hormone replacement therapy (postmenopausal): Secondary | ICD-10-CM | POA: Insufficient documentation

## 2018-02-13 DIAGNOSIS — Z87891 Personal history of nicotine dependence: Secondary | ICD-10-CM | POA: Diagnosis not present

## 2018-02-13 DIAGNOSIS — Z833 Family history of diabetes mellitus: Secondary | ICD-10-CM | POA: Diagnosis not present

## 2018-02-13 DIAGNOSIS — F419 Anxiety disorder, unspecified: Secondary | ICD-10-CM | POA: Insufficient documentation

## 2018-02-13 DIAGNOSIS — R0789 Other chest pain: Secondary | ICD-10-CM | POA: Diagnosis not present

## 2018-02-13 DIAGNOSIS — Z8249 Family history of ischemic heart disease and other diseases of the circulatory system: Secondary | ICD-10-CM | POA: Insufficient documentation

## 2018-02-13 DIAGNOSIS — Z823 Family history of stroke: Secondary | ICD-10-CM | POA: Insufficient documentation

## 2018-02-13 DIAGNOSIS — E039 Hypothyroidism, unspecified: Secondary | ICD-10-CM | POA: Insufficient documentation

## 2018-02-13 DIAGNOSIS — I447 Left bundle-branch block, unspecified: Secondary | ICD-10-CM | POA: Insufficient documentation

## 2018-02-13 DIAGNOSIS — C50011 Malignant neoplasm of nipple and areola, right female breast: Secondary | ICD-10-CM

## 2018-02-13 NOTE — Addendum Note (Signed)
Encounter addended by: Scarlette Calico, RN on: 02/13/2018 10:43 AM  Actions taken: Visit diagnoses modified, Order list changed, Diagnosis association updated, Sign clinical note

## 2018-02-13 NOTE — Progress Notes (Signed)
  Echocardiogram 2D Echocardiogram has been performed.  Rebecca [name] G Rebecca [name] 02/13/2018, 9:56 AM

## 2018-02-13 NOTE — Progress Notes (Signed)
Cardio-Oncology Clinic Note   Referring Physician: Dr Lindi Adie Primary Care: Dr Shelia Media Primary Cardiologist: Dr Haroldine Laws  HPI:  Rebecca [name] is a 47 y.o. female with past medical history of LBBB, DM, anxiety, GERD, diverticulosis, hypothyroidism, iron deficiency anemia, and right breast cancer who is referred to Dr. Lindi Adie to establish in the cardio-oncology clinic for monitoring of cardio-toxicty while undergoing chemotherapy.  Breast cancer history:  Malignant neoplasm of upper-outer quadrant of right breast in female, estrogen receptor negative ([place])   07/09/2017 Initial Diagnosis    Patient palpated a right breast mass which was evaluated by mammogram and ultrasound and a breast MRI which revealed 4.5 x 4 cm necrotic tumor with suspicious multiple right axillary lymph nodes; biopsy revealed IDC grade 3 ER 0%, PR 0%, HER-2 positive ratio 3.08, Ki-67 70%, T2NX stage IIa/IIb      07/18/2017 -  Neo-Adjuvant Chemotherapy    TCH Perjeta x6 cycles followed by Herceptin Perjeta maintenance      08/15/2017 Genetic Testing    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  The Multi-Gene Panel offered by [facility] includes sequencing and/or deletion duplication testing of the following 83 genes: ALK, APC, ATM, AXIN2,BAP1,  BARD1, BLM, BMPR1A, BRCA1, BRCA2, BRIP1, CASR, CDC73, CDH1, CDK4, CDKN1B, CDKN1C, CDKN2A (p14ARF), CDKN2A (p16INK4a), CEBPA, CHEK2, CTNNA1, DICER1, DIS3L2, EGFR (c.2369C>T, p.Thr790Met variant only), EPCAM (Deletion/duplication testing only), FH, FLCN, GATA2, GPC3, GREM1 (Promoter region deletion/duplication testing only), HOXB13 (c.251G>A, p.Gly84Glu), HRAS, KIT, MAX, MEN1, MET, MITF (c.952G>A, p.Glu318Lys variant only), MLH1, MSH2, MSH3, MSH6, MUTYH, NBN, NF1, NF2, NTHL1, PALB2, PDGFRA, PHOX2B, PMS2, POLD1, POLE, POT1, PRKAR1A, PTCH1, PTEN, RAD50, RAD51C, RAD51D, RB1, RECQL4, RET, RUNX1, SDHAF2, SDHA (sequence changes only), SDHB, SDHC,  SDHD, SMAD4, SMARCA4, SMARCB1, SMARCE1, STK11, SUFU, TERT, TERT, TMEM127, TP53, TSC1, TSC2, VHL, WRN and WT1.  The report date is August 15, 2017.      Treatment Plan based on multidisciplinary tumor board: 1. Neoadjuvant chemotherapy with TCH Perjeta 6 cycles followed by Herceptinand Perjetamaintenance for 1 year 2. Followed bybilateral mastectomies with targeted node dissection on the right 3. Followed by adjuvant radiation therapy  Echo 07/17/17: EF 50-55%, borderline diffuse HK, septal motion paradox, trivial AI, LA mildly dilated Echo 10/16/17: EF 45-50%, grade 1 DD, septal motion dyssynergy, trivial MR   She has a family history of CAD but denies any known personal h/o CAD. Has been following with Dr. Einar Gip for LBBB.  She started treatment for her breast CA in March 2019 with [facility] every 3 weeks.  I saw her as a consult in 8/19. EF ~50%. We discussed the fact that her EF is slightly decreased but this is due to septal dyssynergy in setting of LBBB and was completely unchanged from pre-chemo baseline. I reassured her that she does not have any cardiotoxicity from Herceptin at this point. I also emphasized the importance of sticking with her Herceptin therapy to help cure her breast CA and that if she were to get some cardiotoxicity this would be completely reversible with a scheduled treatment interruption. We ordered cMRI to reassess but it did not get done.   Here for f/u. Tolerating Herceptin well. Not as active as she would like to be . Glucose running high. Wants to exercise to get weight down. However has been having some central chest pain over past week or two. Intermittent pain. No associated SOB or diaphoresis. Feels full in chest.   Family HX:   MGF 43s died MI M 66 nash F 60s lymphoma  MGM 4 CABG died 13s CVA   Past Medical History:  Diagnosis Date  . Anxiety   . Breast cancer ([place])   . Diabetes mellitus without complication ([place])   . Diverticulitis   . GERD  (gastroesophageal reflux disease)   . Hiatal hernia   . Hypothyroidism   . LBBB (left bundle branch block)     Current Outpatient Medications  Medication Sig Dispense Refill  . carvedilol (COREG) 3.125 MG tablet Take 1 tablet (3.125 mg total) by mouth 2 (two) times daily. 60 tablet 3  . esomeprazole (NEXIUM) 40 MG capsule Nexium 40 mg capsule,delayed release  Take 1 capsule every day by oral route.    Marland Kitchen levothyroxine (SYNTHROID, LEVOTHROID) 137 MCG tablet Take 137 mcg by mouth daily before breakfast.    . lidocaine-prilocaine (EMLA) cream Apply to affected area once 30 g 3  . LORazepam (ATIVAN) 1 MG tablet Take 1 tablet (1 mg total) by mouth every 8 (eight) hours as needed (nausea and vomiting). 30 tablet 0  . losartan (COZAAR) 25 MG tablet TAKE 1 TABLET BY MOUTH EVERY DAY 30 tablet 5  . metFORMIN (GLUCOPHAGE) 500 MG tablet Take by mouth daily with breakfast.    . ondansetron (ZOFRAN) 8 MG tablet Take 1 tablet (8 mg total) by mouth 2 (two) times daily as needed. Start on the third day after chemotherapy. 30 tablet 1  . triamterene-hydrochlorothiazide (MAXZIDE) 75-50 MG tablet One po daily prn lower extremity edema 30 tablet 2   No current facility-administered medications for this encounter.    Facility-Administered Medications Ordered in Other Encounters  Medication Dose Route Frequency Provider Last Rate Last Dose  . LORazepam (ATIVAN) injection 1 mg  1 mg Intravenous Once Harle Stanford., PA-C        No Known Allergies    Social History   Socioeconomic History  . Marital status: Married    Spouse name: Not on file  . Number of children: 2  . Years of education: Not on file  . Highest education level: Not on file  Occupational History  . Occupation: Neurosurgeon  . Financial resource strain: Not on file  . Food insecurity:    Worry: Not on file    Inability: Not on file  . Transportation needs:    Medical: Not on file    Non-medical: Not on file  Tobacco  Use  . Smoking status: Former Smoker    Packs/day: 0.25    Years: 6.00    Pack years: 1.50    Types: Cigarettes    Last attempt to quit: 03/29/2005    Years since quitting: 12.8  . Smokeless tobacco: Never Used  Substance and Sexual Activity  . Alcohol use: Yes    Comment: socially  . Drug use: No  . Sexual activity: Yes    Birth control/protection: Surgical    Comment: BTL  Lifestyle  . Physical activity:    Days per week: Not on file    Minutes per session: Not on file  . Stress: Not on file  Relationships  . Social connections:    Talks on phone: Not on file    Gets together: Not on file    Attends religious service: Not on file    Active member of club or organization: Not on file    Attends meetings of clubs or organizations: Not on file    Relationship status: Not on file  . Intimate partner violence:    Fear of current  or ex partner: Not on file    Emotionally abused: Not on file    Physically abused: Not on file    Forced sexual activity: Not on file  Other Topics Concern  . Not on file  Social History Narrative   ** Merged History Encounter **          Family History  Problem Relation Age of Onset  . Lymphoma Father   . Cancer Father        lymphoma  . Diabetes Brother   . Diabetes Mother   . Liver disease Mother        NASH, d. 58  . Stomach cancer Neg Hx   . Colon cancer Neg Hx     Vitals:   02/13/18 0952  BP: 129/74  Pulse: 79  SpO2: 100%  Weight: 104.2 kg (229 lb 12.8 oz)   Wt Readings from Last 3 Encounters:  02/13/18 104.2 kg (229 lb 12.8 oz)  01/27/18 104.8 kg (231 lb)  01/13/18 104.1 kg (229 lb 6.4 oz)   PHYSICAL EXAM: General:  Well appearing. No resp difficulty HEENT: normal Neck: supple. no JVD. Carotids 2+ bilat; no bruits. No lymphadenopathy or thryomegaly appreciated. Cor: PMI nondisplaced. Regular rate & rhythm. No rubs, gallops or murmurs. R port Lungs: clear Abdomen: soft, nontender, nondistended. No hepatosplenomegaly.  No bruits or masses. Good bowel sounds. Extremities: no cyanosis, clubbing, rash, edema Neuro: alert & orientedx3, cranial nerves grossly intact. moves all 4 extremities w/o difficulty. Affect pleasant    ASSESSMENT & PLAN:  1. Malignant neoplasm of upper-outer quadrant of right breast in female, estrogen receptor negative ([place]) Neoadjuvant chemotherapy with [place] Perjeta 6 cycles followed by Herceptinand Perjetamaintenance for 1 year - Echo 07/17/17: EF 50-55%, borderline diffuse HK, septal motion paradox, trivial AI, LA mildly dilated - Echo 10/16/17: EF 45-50%, grade 1 DD, septal motion dyssynergy, trivial MR - Echo today. EF 50-55% with LBBB. - I reviewed echos personally. EF and Doppler parameters stable. No HF on exam. Continue Herceptin.  - Continue carvedilol and losartan for now until she finishes Herceptin   2. Chest pressure - Typical and atypical features. Strong family h/o premature CAD. She also has DM - I reviewed chest CT from 3/19 and she does appear to have some coronary calcium - Given symptoms and risk factors with low normal EF and LBBB, I feel she needs definitive imaging will get coronary CTA   3. LBBB - since EKG 2013  4. Iron deficiency anemia - Received IV iron 5/6, 5/28   Glori Bickers, MD  10:14 AM

## 2018-02-13 NOTE — Patient Instructions (Signed)
Your physician has requested that you have cardiac CT. Cardiac computed tomography (CT) is a painless test that uses an x-ray machine to take clear, detailed pictures of your heart. For further information please visit [email or web address]. Please follow instruction sheet as given.  ONCE THIS IS APPROVED WITH YOUR INSURANCE COMPANY WE WILL CONTACT YOU TO SCHEDULE, you will need to [place] the day of and for 2 days after the scan.  Your physician recommends that you schedule a follow-up appointment in: 3 months

## 2018-02-16 NOTE — H&P (Signed)
  Subjective:     Patient ID: Rebecca [name] is a 47 y.o. female.  HPI  12 weeks post op. Scheduled for implant exchange, lipofilling bilateral chest.  Presented with palpable mass right breast. MMG noted extremely dense tissue; US showed at 8:30, 6 cmfn, mass measuring 5.1 x 3.0 x 3.1 cm. Additional mass at 8:30, 2 cmfn measuring 8 x 7 x 8 mm, favored to represent a cyst. US of the right axilla demonstrates multiple symmetric lymph nodes.  MRI showed 4.3 cm mass in the lateral aspect of the right breast with multiple concerning LN. Repeat US axilla with no abnormal nodes.   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 Completed neoadjuvant chemotherapy 7.9.19.  Final MRI with mass measuring 4.0 x 2.7 x 3.1 cm. On the previous exam, mass measured 4.4 x 4.4 x 2.6 cm by similar measurements. No abnormal appearing lymph nodes.  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 Due to significant residual disease, additional adjuvant chemo with Adriamycin and Cytoxan started 11/2017.  Based on final pathology, she will not receive adjuvant radiation.   Genetics with VUS MSH6. Staging scans negative.  Reports she was 36 C most of her life, prior to surgery 38D after 60 lb weight gain over last 3 years. Right mastectomy 957 g Left  851 g  PMH significant for left BBB, pre-DM (last HbA1c 6 per patient)  Works from [employer] an [employer] fast [employer] for [employer].   Review of Systems     Objective:   Physical Exam  Cardiovascular: Normal rate, regular rhythm and normal heart sounds.  Pulmonary/Chest: Effort normal and breath sounds normal.  Abd: right lower quadrant bulge per patient is hernia, evaluated by [facility], prior abdominoplasty scars Chest: soft scars maturing no seroma Lateral chest fullness bilateral    Assessment:     Right breast ca ER-, Her2+ Neoadjuvant chemotherapy S/p bilateral SRM, prepectoral TE/ADM (Alloderm)  reconstruction, SLN    Plan:      Plan surgery for implant exchange, lipofilling.  Given prepectoral placement, recommend HCG or capacity filled implants. Discussed [place] or MRI surveillance silicone implants for rupture. Counseled no proven toxicity silicone if exposed to rupture. Reviewed saline vs silicone implants and associated rippling.  She has agreed to silicone, plan smooth round capacity filled. Counseled size will be limited by chest wall width, reviewed 008 ml largest silicone implant made. Discussed role of fat grafting to improve contour, reduce visible rippling. Discussed variable take graft, need to repeat procedure, fat necrosis that presents as lumps, cysts. She has purchased garment for post op use. Plan flanks as donor site. Will plan liposuction bilateral lateral chest at well.  Additional risks including but not limited to seroma, hematoma, damage to deeper structures, need for additional surgery, asymmetry, unacceptable cosmetic result, DVT/PR, cardiopulmonary complications reviewed.  Plan OP surgery. Do not anticipate drains.  Rx for Bactrim givien. She will check her pain medication left at home.  Natrelle 133S-FV-13 T 500 ml tissue expanders placed bilateral,  fill volume 500 ml saline bilateral   Irene Limbo, MD [facility] [phone], pin [phone]

## 2018-02-17 ENCOUNTER — Other Ambulatory Visit: Payer: Self-pay | Admitting: Hematology and Oncology

## 2018-02-17 ENCOUNTER — Inpatient Hospital Stay (HOSPITAL_BASED_OUTPATIENT_CLINIC_OR_DEPARTMENT_OTHER): Payer: BLUE CROSS/BLUE SHIELD | Admitting: Hematology and Oncology

## 2018-02-17 ENCOUNTER — Inpatient Hospital Stay: Payer: BLUE CROSS/BLUE SHIELD

## 2018-02-17 ENCOUNTER — Ambulatory Visit: Payer: BLUE CROSS/BLUE SHIELD

## 2018-02-17 ENCOUNTER — Encounter: Payer: Self-pay | Admitting: *Deleted

## 2018-02-17 ENCOUNTER — Other Ambulatory Visit: Payer: Self-pay

## 2018-02-17 VITALS — BP 114/70 | HR 78 | Temp 98.0°F | Resp 18

## 2018-02-17 VITALS — BP 113/81 | HR 84 | Temp 98.1°F | Resp 18 | Ht 70.0 in | Wt 230.6 lb

## 2018-02-17 DIAGNOSIS — Z171 Estrogen receptor negative status [ER-]: Principal | ICD-10-CM

## 2018-02-17 DIAGNOSIS — C50411 Malignant neoplasm of upper-outer quadrant of right female breast: Secondary | ICD-10-CM | POA: Diagnosis not present

## 2018-02-17 DIAGNOSIS — Z9013 Acquired absence of bilateral breasts and nipples: Secondary | ICD-10-CM | POA: Diagnosis not present

## 2018-02-17 DIAGNOSIS — Z139 Encounter for screening, unspecified: Secondary | ICD-10-CM

## 2018-02-17 DIAGNOSIS — Z9221 Personal history of antineoplastic chemotherapy: Secondary | ICD-10-CM

## 2018-02-17 DIAGNOSIS — Z79899 Other long term (current) drug therapy: Secondary | ICD-10-CM

## 2018-02-17 DIAGNOSIS — Z95828 Presence of other vascular implants and grafts: Secondary | ICD-10-CM

## 2018-02-17 LAB — CMP (CANCER CENTER ONLY)
ALK PHOS: 68 U/L (ref 38–126)
ALT: 16 U/L (ref 0–44)
AST: 13 U/L — AB (ref 15–41)
Albumin: 3.9 g/dL (ref 3.5–5.0)
Anion gap: 11 (ref 5–15)
BUN: 14 mg/dL (ref 6–20)
CALCIUM: 9.5 mg/dL (ref 8.9–10.3)
CHLORIDE: 104 mmol/L (ref 98–111)
CO2: 25 mmol/L (ref 22–32)
CREATININE: 0.78 mg/dL (ref 0.44–1.00)
GFR, Est AFR Am: >60 mL/min (ref >60)
Glucose, Bld: 104 mg/dL — ABNORMAL HIGH (ref 70–99)
Potassium: 4.3 mmol/L (ref 3.5–5.1)
SODIUM: 140 mmol/L (ref 135–145)
Total Bilirubin: 0.4 mg/dL (ref 0.3–1.2)
Total Protein: 6.9 g/dL (ref 6.5–8.1)

## 2018-02-17 LAB — CBC WITH DIFFERENTIAL (CANCER CENTER ONLY)
Abs Immature Granulocytes: 0.01 10*3/uL (ref 0.00–0.07)
Basophils Absolute: 0 10*3/uL (ref 0.0–0.1)
Basophils Relative: 1 %
EOS ABS: 0 10*3/uL (ref 0.0–0.5)
EOS PCT: 0 %
HEMATOCRIT: 32.2 % — AB (ref 36.0–46.0)
HEMOGLOBIN: 10.1 g/dL — AB (ref 12.0–15.0)
Immature Granulocytes: 0 %
Lymphocytes Relative: 19 %
Lymphs Abs: 1 10*3/uL (ref 0.7–4.0)
MCH: 28.9 pg (ref 26.0–34.0)
MCHC: 31.4 g/dL (ref 30.0–36.0)
MCV: 92.3 fL (ref 80.0–100.0)
MONO ABS: 0.6 10*3/uL (ref 0.1–1.0)
Monocytes Relative: 12 %
Neutro Abs: 3.6 10*3/uL (ref 1.7–7.7)
Neutrophils Relative %: 68 %
Platelet Count: 306 10*3/uL (ref 150–400)
RBC: 3.49 MIL/uL — ABNORMAL LOW (ref 3.87–5.11)
RDW: 19.3 % — AB (ref 11.5–15.5)
WBC Count: 5.4 10*3/uL (ref 4.0–10.5)
nRBC: 0 % (ref 0.0–0.2)

## 2018-02-17 LAB — LIPID PANEL
CHOL/HDL RATIO: 5.8 ratio
Cholesterol: 219 mg/dL — ABNORMAL HIGH (ref 0–200)
HDL: 38 mg/dL — ABNORMAL LOW (ref >40)
LDL Cholesterol: 122 mg/dL — ABNORMAL HIGH (ref 0–99)
Triglycerides: 296 mg/dL — ABNORMAL HIGH (ref <150)
VLDL: 59 mg/dL — ABNORMAL HIGH (ref 0–40)

## 2018-02-17 LAB — HEMOGLOBIN A1C
Hgb A1c MFr Bld: 7.1 % — ABNORMAL HIGH (ref 4.8–5.6)
Mean Plasma Glucose: 157.07 mg/dL

## 2018-02-17 MED ORDER — SODIUM CHLORIDE 0.9 % IV SOLN
420.0000 mg | Freq: Once | INTRAVENOUS | Status: AC
  Administered 2018-02-17: 420 mg via INTRAVENOUS
  Filled 2018-02-17: qty 14

## 2018-02-17 MED ORDER — ACETAMINOPHEN 325 MG PO TABS
650.0000 mg | ORAL_TABLET | Freq: Once | ORAL | Status: AC
  Administered 2018-02-17: 650 mg via ORAL

## 2018-02-17 MED ORDER — DIPHENOXYLATE-ATROPINE 2.5-0.025 MG PO TABS: 1.0000 | ORAL_TABLET | Freq: Four times a day (QID) | ORAL | 0 refills | Status: DC | PRN

## 2018-02-17 MED ORDER — TRASTUZUMAB CHEMO 150 MG IV SOLR
600.0000 mg | Freq: Once | INTRAVENOUS | Status: AC
  Administered 2018-02-17: 600 mg via INTRAVENOUS
  Filled 2018-02-17: qty 28.57

## 2018-02-17 MED ORDER — DIPHENHYDRAMINE HCL 25 MG PO CAPS
50.0000 mg | ORAL_CAPSULE | Freq: Once | ORAL | Status: AC
  Administered 2018-02-17: 50 mg via ORAL

## 2018-02-17 MED ORDER — DIPHENHYDRAMINE HCL 25 MG PO CAPS
ORAL_CAPSULE | ORAL | Status: AC
  Filled 2018-02-17: qty 2

## 2018-02-17 MED ORDER — SODIUM CHLORIDE 0.9% FLUSH
10.0000 mL | INTRAVENOUS | Status: DC | PRN
  Administered 2018-02-17: 10 mL
  Filled 2018-02-17: qty 10

## 2018-02-17 MED ORDER — ONDANSETRON HCL 8 MG PO TABS
8.0000 mg | ORAL_TABLET | Freq: Once | ORAL | Status: AC
  Administered 2018-02-17: 8 mg via ORAL

## 2018-02-17 MED ORDER — SODIUM CHLORIDE 0.9 % IV SOLN
Freq: Once | INTRAVENOUS | Status: AC
  Administered 2018-02-17: 12:00:00 via INTRAVENOUS
  Filled 2018-02-17: qty 250

## 2018-02-17 MED ORDER — ACETAMINOPHEN 325 MG PO TABS
ORAL_TABLET | ORAL | Status: AC
  Filled 2018-02-17: qty 2

## 2018-02-17 MED ORDER — HEPARIN SOD (PORK) LOCK FLUSH 100 UNIT/ML IV SOLN
500.0000 [IU] | Freq: Once | INTRAVENOUS | Status: AC | PRN
  Administered 2018-02-17: 500 [IU]
  Filled 2018-02-17: qty 5

## 2018-02-17 MED ORDER — ONDANSETRON HCL 8 MG PO TABS
ORAL_TABLET | ORAL | Status: AC
  Filled 2018-02-17: qty 1

## 2018-02-17 NOTE — Assessment & Plan Note (Signed)
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 Neoadjuvant chemotherapy with TCH Perjeta 6 cycles followed by Herceptinand Perjetamaintenance for 1 year 2. Followed bybilateral mastectomies with targeted node dissection on the right7/23/2019 3.Due to significant residual disease, additional adjuvant chemo with Adriamycin and Cytoxan 12/18/2017-01/27/2018 4. Followed by adjuvant radiation therapy ----------------------------------------------------------------------------- 11/18/2017:Bilateral mastectomies: Right mastectomy: IDC grade 3 3.8 cm margins negative, 0/4 lymph nodes negative, ER 0%, PR 0%, HER-2 negative ratio 1.3, IHC HER-2 negative; Ki-67 70%, RCB class II; left mastectomy: ALH  Current treatment: Herceptin and Perjeta maintenance Echocardiogram 02/13/2018: EF 50 to 55% unchanged  Patient will need adjuvant radiation Return to clinic every 3 weeks for Herceptin and Perjeta in every 6 weeks for follow-up with me.

## 2018-02-17 NOTE — Progress Notes (Signed)
Do not load herceptin and perjeta per Dr. Lindi Adie

## 2018-02-17 NOTE — Patient Instructions (Addendum)
[facility] Discharge Instructions for Patients Receiving Chemotherapy  Today you received the following chemotherapy agents: Herceptin, Perjeta.  To help prevent nausea and vomiting after your treatment, we encourage you to take your nausea medication as prescribed.   If you develop nausea and vomiting that is not controlled by your nausea medication, call the clinic.   BELOW ARE SYMPTOMS THAT SHOULD BE REPORTED IMMEDIATELY:  *FEVER GREATER THAN 100.5 F  *CHILLS WITH OR WITHOUT FEVER  NAUSEA AND VOMITING THAT IS NOT CONTROLLED WITH YOUR NAUSEA MEDICATION  *UNUSUAL SHORTNESS OF BREATH  *UNUSUAL BRUISING OR BLEEDING  TENDERNESS IN MOUTH AND THROAT WITH OR WITHOUT PRESENCE OF ULCERS  *URINARY PROBLEMS  *BOWEL PROBLEMS  UNUSUAL RASH Items with * indicate a potential emergency and should be followed up as soon as possible.  Feel free to call the clinic should you have any questions or concerns. The clinic phone number is [phone].  Please show the [place] at check-in to the Emergency Department and triage nurse.  Trastuzumab injection for infusion(Hreceptin) What is this medicine? TRASTUZUMAB (tras TOO zoo mab) is a monoclonal antibody. It is used to treat breast cancer and stomach cancer. This medicine may be used for other purposes; ask your health care provider or pharmacist if you have questions. COMMON BRAND NAME(S): Herceptin What should I tell my health care provider before I take this medicine? They need to know if you have any of these conditions: -heart disease -heart failure -lung or breathing disease, like asthma -an unusual or allergic reaction to trastuzumab, benzyl alcohol, or other medications, foods, dyes, or preservatives -pregnant or trying to get pregnant -breast-feeding How should I use this medicine? This drug is given as an infusion into a vein. It is administered in a hospital or clinic by a specially trained health care  professional. Talk to your pediatrician regarding the use of this medicine in children. This medicine is not approved for use in children. Overdosage: If you think you have taken too much of this medicine contact a poison control center or emergency room at once. NOTE: This medicine is only for you. Do not share this medicine with others. What if I miss a dose? It is important not to miss a dose. Call your doctor or health care professional if you are unable to keep an appointment. What may interact with this medicine? This medicine may interact with the following medications: -certain types of chemotherapy, such as daunorubicin, doxorubicin, epirubicin, and idarubicin This list may not describe all possible interactions. Give your health care provider a list of all the medicines, herbs, non-prescription drugs, or dietary supplements you use. Also tell them if you smoke, drink alcohol, or use illegal drugs. Some items may interact with your medicine. What should I watch for while using this medicine? Visit your doctor for checks on your progress. Report any side effects. Continue your course of treatment even though you feel ill unless your doctor tells you to stop. Call your doctor or health care professional for advice if you get a fever, chills or sore throat, or other symptoms of a cold or flu. Do not treat yourself. Try to avoid being around people who are sick. You may experience fever, chills and shaking during your first infusion. These effects are usually mild and can be treated with other medicines. Report any side effects during the infusion to your health care professional. Fever and chills usually do not happen with later infusions. Do not become pregnant  while taking this medicine or for 7 months after stopping it. Women should inform their doctor if they wish to become pregnant or think they might be pregnant. Women of child-bearing potential will need to have a negative pregnancy test  before starting this medicine. There is a potential for serious side effects to an unborn child. Talk to your health care professional or pharmacist for more information. Do not breast-feed an infant while taking this medicine or for 7 months after stopping it. Women must use effective birth control with this medicine. What side effects may I notice from receiving this medicine? Side effects that you should report to your doctor or health care professional as soon as possible: -allergic reactions like skin rash, itching or hives, swelling of the face, lips, or tongue -chest pain or palpitations -cough -dizziness -feeling faint or lightheaded, falls -fever -general ill feeling or flu-like symptoms -signs of worsening heart failure like breathing problems; swelling in your legs and feet -unusually weak or tired Side effects that usually do not require medical attention (report to your doctor or health care professional if they continue or are bothersome): -bone pain -changes in taste -diarrhea -joint pain -nausea/vomiting -weight loss This list may not describe all possible side effects. Call your doctor for medical advice about side effects. You may report side effects to FDA at 1-800-FDA-1088. Where should I keep my medicine? This drug is given in a hospital or clinic and will not be stored at home. NOTE: This sheet is a summary. It may not cover all possible information. If you have questions about this medicine, talk to your doctor, pharmacist, or health care provider.  2018 Elsevier/Gold Standard (2016-04-09 14:37:52)   Pertuzumab injection(Perjeta) What is this medicine? PERTUZUMAB (per TOOZ ue mab) is a monoclonal antibody. It is used to treat breast cancer. This medicine may be used for other purposes; ask your health care provider or pharmacist if you have questions. COMMON BRAND NAME(S): PERJETA What should I tell my health care provider before I take this medicine? They  need to know if you have any of these conditions: -heart disease -heart failure -high blood pressure -history of irregular heart beat -recent or ongoing radiation therapy -an unusual or allergic reaction to pertuzumab, other medicines, foods, dyes, or preservatives -pregnant or trying to get pregnant -breast-feeding How should I use this medicine? This medicine is for infusion into a vein. It is given by a health care professional in a hospital or clinic setting. Talk to your pediatrician regarding the use of this medicine in children. Special care may be needed. Overdosage: If you think you have taken too much of this medicine contact a poison control center or emergency room at once. NOTE: This medicine is only for you. Do not share this medicine with others. What if I miss a dose? It is important not to miss your dose. Call your doctor or health care professional if you are unable to keep an appointment. What may interact with this medicine? Interactions are not expected. Give your health care provider a list of all the medicines, herbs, non-prescription drugs, or dietary supplements you use. Also tell them if you smoke, drink alcohol, or use illegal drugs. Some items may interact with your medicine. This list may not describe all possible interactions. Give your health care provider a list of all the medicines, herbs, non-prescription drugs, or dietary supplements you use. Also tell them if you smoke, drink alcohol, or use illegal drugs. Some items may interact with  your medicine. What should I watch for while using this medicine? Your condition will be monitored carefully while you are receiving this medicine. Report any side effects. Continue your course of treatment even though you feel ill unless your doctor tells you to stop. Do not become pregnant while taking this medicine or for 7 months after stopping it. Women should inform their doctor if they wish to become pregnant or think  they might be pregnant. Women of child-bearing potential will need to have a negative pregnancy test before starting this medicine. There is a potential for serious side effects to an unborn child. Talk to your health care professional or pharmacist for more information. Do not breast-feed an infant while taking this medicine or for 7 months after stopping it. Women must use effective birth control with this medicine. Call your doctor or health care professional for advice if you get a fever, chills or sore throat, or other symptoms of a cold or flu. Do not treat yourself. Try to avoid being around people who are sick. You may experience fever, chills, and headache during the infusion. Report any side effects during the infusion to your health care professional. What side effects may I notice from receiving this medicine? Side effects that you should report to your doctor or health care professional as soon as possible: -breathing problems -chest pain or palpitations -dizziness -feeling faint or lightheaded -fever or chills -skin rash, itching or hives -sore throat -swelling of the face, lips, or tongue -swelling of the legs or ankles -unusually weak or tired Side effects that usually do not require medical attention (report to your doctor or health care professional if they continue or are bothersome): -diarrhea -hair loss -nausea, vomiting -tiredness This list may not describe all possible side effects. Call your doctor for medical advice about side effects. You may report side effects to FDA at 1-800-FDA-1088. Where should I keep my medicine? This drug is given in a hospital or clinic and will not be stored at home. NOTE: This sheet is a summary. It may not cover all possible information. If you have questions about this medicine, talk to your doctor, pharmacist, or health care provider.  2018 Elsevier/Gold Standard (2015-05-18 12:08:50)

## 2018-02-17 NOTE — Progress Notes (Signed)
Patient Care Team: Rebecca Pretty, MD as PCP - General (Internal Medicine) Rebecca [name], Rebecca Lick, MD (Internal Medicine)  DIAGNOSIS:  Encounter Diagnoses  Name Primary?  . Malignant neoplasm of upper-outer quadrant of right breast in female, estrogen receptor negative ([place])   . Encounter for health-related screening Yes    SUMMARY OF ONCOLOGIC HISTORY:   Malignant neoplasm of upper-outer quadrant of right breast in female, estrogen receptor negative ([place])   07/09/2017 Initial Diagnosis    Patient palpated a right breast mass which was evaluated by mammogram and ultrasound and a breast MRI which revealed 4.5 x 4 cm necrotic tumor with suspicious multiple right axillary lymph nodes; biopsy revealed IDC grade 3 ER 0%, PR 0%, HER-2 positive ratio 3.08, Ki-67 70%, T2NX stage IIa/IIb    07/18/2017 - 11/04/2017 Neo-Adjuvant Chemotherapy    TCH Perjeta x6 cycles followed by Herceptin Perjeta maintenance    08/15/2017 Genetic Testing    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  The Multi-Gene Panel offered by [facility] includes sequencing and/or deletion duplication testing of the following 83 genes: ALK, APC, ATM, AXIN2,BAP1,  BARD1, BLM, BMPR1A, BRCA1, BRCA2, BRIP1, CASR, CDC73, CDH1, CDK4, CDKN1B, CDKN1C, CDKN2A (p14ARF), CDKN2A (p16INK4a), CEBPA, CHEK2, CTNNA1, DICER1, DIS3L2, EGFR (c.2369C>T, p.Thr790Met variant only), EPCAM (Deletion/duplication testing only), FH, FLCN, GATA2, GPC3, GREM1 (Promoter region deletion/duplication testing only), HOXB13 (c.251G>A, p.Gly84Glu), HRAS, KIT, MAX, MEN1, MET, MITF (c.952G>A, p.Glu318Lys variant only), MLH1, MSH2, MSH3, MSH6, MUTYH, NBN, NF1, NF2, NTHL1, PALB2, PDGFRA, PHOX2B, PMS2, POLD1, POLE, POT1, PRKAR1A, PTCH1, PTEN, RAD50, RAD51C, RAD51D, RB1, RECQL4, RET, RUNX1, SDHAF2, SDHA (sequence changes only), SDHB, SDHC, SDHD, SMAD4, SMARCA4, SMARCB1, SMARCE1, STK11, SUFU, TERT, TERT, TMEM127, TP53, TSC1, TSC2, VHL, WRN and WT1.  The report  date is August 15, 2017.    11/18/2017 Surgery    Bilateral mastectomies: Right mastectomy: IDC grade 3 3.8 cm margins negative, 0/4 lymph nodes negative, ER 0%, PR 0%, HER-2 negative ratio 1.3, IHC HER-2 negative; Ki-67 70%, RCB class II; left mastectomy: [facility]    12/18/2017 -  Chemotherapy    Adjuvant chemotherapy with dose dense Adriamycin and Cytoxan x4     CHIEF COMPLIANT: Herceptin and Perjeta maintenance  INTERVAL HISTORY: Rebecca [name] is a 47 year old with above-mentioned history of HER-2 positive breast cancer who is here to receive Herceptin and Perjeta maintenance.  She is undergoing breast reconstruction.  She is exhausted from all the prior treatments.  REVIEW OF SYSTEMS:   Constitutional: Denies fevers, chills or abnormal weight loss Eyes: Denies blurriness of vision Ears, nose, mouth, throat, and face: Denies mucositis or sore throat Respiratory: Denies cough, dyspnea or wheezes Cardiovascular: Denies palpitation, chest discomfort Gastrointestinal:  Denies nausea, heartburn or change in bowel habits Skin: Denies abnormal skin rashes Lymphatics: Denies new lymphadenopathy or easy bruising Neurological:Denies numbness, tingling or new weaknesses Behavioral/Psych: Mood is stable, no new changes  Extremities: No lower extremity edema   All other systems were reviewed with the patient and are negative.  I have reviewed the past medical history, past surgical history, social history and family history with the patient and they are unchanged from previous note.  ALLERGIES:  has No Known Allergies.  MEDICATIONS:  Current Outpatient Medications  Medication Sig Dispense Refill  . carvedilol (COREG) 3.125 MG tablet Take 1 tablet (3.125 mg total) by mouth 2 (two) times daily. 60 tablet 3  . esomeprazole (NEXIUM) 40 MG capsule Nexium 40 mg capsule,delayed release  Take 1 capsule every day by oral route.    Marland Kitchen  levothyroxine (SYNTHROID, LEVOTHROID) 137 MCG tablet Take 137 mcg  by mouth daily before breakfast.    . lidocaine-prilocaine (EMLA) cream Apply to affected area once 30 g 3  . LORazepam (ATIVAN) 1 MG tablet Take 1 tablet (1 mg total) by mouth every 8 (eight) hours as needed (nausea and vomiting). 30 tablet 0  . losartan (COZAAR) 25 MG tablet TAKE 1 TABLET BY MOUTH EVERY DAY 30 tablet 5  . metFORMIN (GLUCOPHAGE) 500 MG tablet Take by mouth daily with breakfast.    . ondansetron (ZOFRAN) 8 MG tablet Take 1 tablet (8 mg total) by mouth 2 (two) times daily as needed. Start on the third day after chemotherapy. 30 tablet 1  . triamterene-hydrochlorothiazide (MAXZIDE) 75-50 MG tablet One po daily prn lower extremity edema 30 tablet 2   No current facility-administered medications for this visit.    Facility-Administered Medications Ordered in Other Visits  Medication Dose Route Frequency Provider Last Rate Last Dose  . LORazepam (ATIVAN) injection 1 mg  1 mg Intravenous Once Rebecca [name]., PA-C        PHYSICAL EXAMINATION: ECOG PERFORMANCE STATUS: 1 - Symptomatic but completely ambulatory  Vitals:   02/17/18 0959  BP: 113/81  Pulse: 84  Resp: 18  Temp: 98.1 F (36.7 C)  SpO2: 99%   Filed Weights   02/17/18 0959  Weight: 230 lb 9.6 oz (104.6 kg)    GENERAL:alert, no distress and comfortable SKIN: skin color, texture, turgor are normal, no rashes or significant lesions EYES: normal, Conjunctiva are pink and non-injected, sclera clear OROPHARYNX:no exudate, no erythema and lips, buccal mucosa, and tongue normal  NECK: supple, thyroid normal size, non-tender, without nodularity LYMPH:  no palpable lymphadenopathy in the cervical, axillary or inguinal LUNGS: clear to auscultation and percussion with normal breathing effort HEART: regular rate & rhythm and no murmurs and no lower extremity edema ABDOMEN:abdomen soft, non-tender and normal bowel sounds MUSCULOSKELETAL:no cyanosis of digits and no clubbing  NEURO: alert & oriented x 3 with fluent  speech, no focal motor/sensory deficits EXTREMITIES: No lower extremity edema   LABORATORY DATA:  I have reviewed the data as listed CMP Latest Ref Rng & Units 01/27/2018 01/13/2018 12/30/2017  Glucose 70 - 99 mg/dL 144(H) 136(H) 108(H)  BUN 6 - 20 mg/dL 12 12 9   Creatinine 0.44 - 1.00 mg/dL 0.79 0.81 0.86  Sodium 135 - 145 mmol/L 142 142 142  Potassium 3.5 - 5.1 mmol/L 4.3 4.3 4.2  Chloride 98 - 111 mmol/L 106 105 106  CO2 22 - 32 mmol/L 27 28 27   Calcium 8.9 - 10.3 mg/dL 9.3 9.4 9.1  Total Protein 6.5 - 8.1 g/dL 6.7 6.5 6.0(L)  Total Bilirubin 0.3 - 1.2 mg/dL 0.3 <0.2(L) <0.2(L)  Alkaline Phos 38 - 126 U/L 79 83 76  AST 15 - 41 U/L 14(L) 10(L) 12(L)  ALT 0 - 44 U/L 15 11 15     Lab Results  Component Value Date   WBC 5.4 02/17/2018   HGB 10.1 (L) 02/17/2018   HCT 32.2 (L) 02/17/2018   MCV 92.3 02/17/2018   PLT 306 02/17/2018   NEUTROABS 3.6 02/17/2018    ASSESSMENT & PLAN:  Malignant neoplasm of upper-outer quadrant of right breast in female, estrogen receptor negative ([place]) /13/2019:Patient palpated a right breast mass which was evaluated by mammogram and ultrasound and a breast MRI which revealed 4.5 x 4 cm necrotic tumor with suspicious multiple right axillary lymph nodes; biopsy revealed IDC grade 3 ER 0%, PR 0%,  HER-2 positive ratio 3.08, Ki-67 70%, T2NX stage IIa/IIb  Treatment planbased on multidisciplinary tumor board: 1. Neoadjuvant chemotherapy with TCH Perjeta 6 cycles followed by Herceptinand Perjetamaintenance for 1 year 2. Followed bybilateral mastectomies with targeted node dissection on the right7/23/2019 3.Due to significant residual disease, additional adjuvant chemo with Adriamycin and Cytoxan 12/18/2017-01/27/2018 4. Followed by adjuvant radiation therapy ----------------------------------------------------------------------------- 11/18/2017:Bilateral mastectomies: Right mastectomy: IDC grade 3 3.8 cm margins negative, 0/4 lymph nodes negative, ER 0%,  PR 0%, HER-2 negative ratio 1.3, IHC HER-2 negative; Ki-67 70%, RCB class II; left mastectomy: [facility]  Current treatment: Herceptin and Perjeta maintenance Echocardiogram 02/13/2018: EF 50 to 55% unchanged  Health screening: Patient has not had a TSH, hemoglobin A1c and fasting lipid panel.  We will order these tests.  Patient will need adjuvant radiation Return to clinic every 3 weeks for Herceptin and Perjeta in every 6 weeks for follow-up with me.   Orders Placed This Encounter  Procedures  . Hemoglobin A1c    Standing Status:   Future    Standing Expiration Date:   02/17/2019  . Lipid panel    Standing Status:   Future    Standing Expiration Date:   02/17/2019  . Thyroid Panel With TSH    Standing Status:   Future    Standing Expiration Date:   02/17/2019   The patient has a good understanding of the overall plan. she agrees with it. she will call with any problems that may develop before the next visit here.   Harriette Ohara, MD 02/17/18

## 2018-02-17 NOTE — Progress Notes (Signed)
Pt refused to stay for 66min obs window. VSS, see flowsheet.

## 2018-02-18 ENCOUNTER — Encounter (HOSPITAL_BASED_OUTPATIENT_CLINIC_OR_DEPARTMENT_OTHER): Payer: Self-pay | Admitting: *Deleted

## 2018-02-18 ENCOUNTER — Telehealth: Payer: Self-pay | Admitting: Hematology and Oncology

## 2018-02-18 ENCOUNTER — Other Ambulatory Visit: Payer: Self-pay | Admitting: Hematology and Oncology

## 2018-02-18 ENCOUNTER — Other Ambulatory Visit: Payer: Self-pay

## 2018-02-18 LAB — THYROID PANEL WITH TSH
FREE THYROXINE INDEX: 2.9 (ref 1.2–4.9)
T3 Uptake Ratio: 28 % (ref 24–39)
T4, Total: 10.2 ug/dL (ref 4.5–12.0)
TSH: 1.25 u[IU]/mL (ref 0.450–4.500)

## 2018-02-18 MED ORDER — ESOMEPRAZOLE MAGNESIUM 40 MG PO CPDR: 40.0000 mg | DELAYED_RELEASE_CAPSULE | Freq: Every day | ORAL | 3 refills | Status: DC

## 2018-02-18 NOTE — Progress Notes (Signed)
I called and discussed with the patient about the treatment plan. Patient is getting Herceptin and Perjeta maintenance. I reassured her that these drugs are not going to cause additional hair loss or slow down her recovery. So far she has not had any diarrhea. I discussed the benefits of Perjeta added to Herceptin based on [employer] clinical trial She understands the benefits and is agreeable to proceed with the treatment as long as she does not have any major side effects.

## 2018-02-18 NOTE — Telephone Encounter (Signed)
No 10/22 los orders.  °

## 2018-02-20 NOTE — Progress Notes (Signed)
Ensure drink given to pt and instructed to drink by0415 and she states understanding.

## 2018-02-23 ENCOUNTER — Other Ambulatory Visit: Payer: Self-pay | Admitting: Hematology and Oncology

## 2018-02-23 MED ORDER — DIPHENOXYLATE-ATROPINE 2.5-0.025 MG PO TABS: 1.0000 | ORAL_TABLET | Freq: Four times a day (QID) | ORAL | 0 refills | Status: DC | PRN

## 2018-02-23 NOTE — Anesthesia Preprocedure Evaluation (Addendum)
Anesthesia Evaluation  Patient identified by MRN, date of birth, ID band Patient awake    Reviewed: Allergy & Precautions, NPO status , Patient's Chart, lab work & pertinent test results  Airway Mallampati: I  TM Distance: >3 FB Neck ROM: Full    Dental  (+) Teeth Intact   Pulmonary former smoker,    breath sounds clear to auscultation       Cardiovascular hypertension, + dysrhythmias  Rhythm:Regular Rate:Normal     Neuro/Psych    GI/Hepatic Neg liver ROS, hiatal hernia, GERD  Medicated,  Endo/Other  diabetesHypothyroidism   Renal/GU      Musculoskeletal   Abdominal   Peds  Hematology   Anesthesia Other Findings   Reproductive/Obstetrics                            Anesthesia Physical  Anesthesia Plan  ASA: III  Anesthesia Plan: General   Post-op Pain Management:    Induction: Intravenous  PONV Risk Score and Plan: Treatment may vary due to age or medical condition, Ondansetron, Dexamethasone and Midazolam  Airway Management Planned: Oral ETT  Additional Equipment:   Intra-op Plan:   Post-operative Plan: Extubation in OR  Informed Consent: I have reviewed the patients History and Physical, chart, labs and discussed the procedure including the risks, benefits and alternatives for the proposed anesthesia with the patient or authorized representative who has indicated his/her understanding and acceptance.     Plan Discussed with: CRNA and Anesthesiologist  Anesthesia Plan Comments:         Anesthesia Quick Evaluation

## 2018-02-24 ENCOUNTER — Encounter (HOSPITAL_BASED_OUTPATIENT_CLINIC_OR_DEPARTMENT_OTHER)
Admission: RE | Disposition: A | Discharge status: Home / Self Care | Payer: Self-pay | Source: Ambulatory Visit | Attending: Plastic Surgery

## 2018-02-24 ENCOUNTER — Encounter (HOSPITAL_BASED_OUTPATIENT_CLINIC_OR_DEPARTMENT_OTHER): Payer: Self-pay | Admitting: *Deleted

## 2018-02-24 ENCOUNTER — Ambulatory Visit (HOSPITAL_BASED_OUTPATIENT_CLINIC_OR_DEPARTMENT_OTHER)
Admission: RE | Admit: 2018-02-24 | Discharge: 2018-02-24 | Disposition: A | Discharge status: Home / Self Care | Payer: BLUE CROSS/BLUE SHIELD | Source: Ambulatory Visit | Attending: Plastic Surgery | Admitting: Plastic Surgery

## 2018-02-24 ENCOUNTER — Ambulatory Visit (HOSPITAL_BASED_OUTPATIENT_CLINIC_OR_DEPARTMENT_OTHER): Payer: BLUE CROSS/BLUE SHIELD | Admitting: Anesthesiology

## 2018-02-24 ENCOUNTER — Other Ambulatory Visit: Payer: Self-pay

## 2018-02-24 DIAGNOSIS — C50911 Malignant neoplasm of unspecified site of right female breast: Secondary | ICD-10-CM | POA: Diagnosis not present

## 2018-02-24 DIAGNOSIS — Z421 Encounter for breast reconstruction following mastectomy: Secondary | ICD-10-CM | POA: Insufficient documentation

## 2018-02-24 DIAGNOSIS — I10 Essential (primary) hypertension: Secondary | ICD-10-CM | POA: Diagnosis not present

## 2018-02-24 DIAGNOSIS — Z9221 Personal history of antineoplastic chemotherapy: Secondary | ICD-10-CM | POA: Insufficient documentation

## 2018-02-24 DIAGNOSIS — Z87891 Personal history of nicotine dependence: Secondary | ICD-10-CM | POA: Insufficient documentation

## 2018-02-24 DIAGNOSIS — Z853 Personal history of malignant neoplasm of breast: Secondary | ICD-10-CM | POA: Diagnosis not present

## 2018-02-24 DIAGNOSIS — Z9013 Acquired absence of bilateral breasts and nipples: Secondary | ICD-10-CM | POA: Diagnosis not present

## 2018-02-24 DIAGNOSIS — K219 Gastro-esophageal reflux disease without esophagitis: Secondary | ICD-10-CM | POA: Diagnosis not present

## 2018-02-24 DIAGNOSIS — E119 Type 2 diabetes mellitus without complications: Secondary | ICD-10-CM | POA: Diagnosis not present

## 2018-02-24 DIAGNOSIS — Z1501 Genetic susceptibility to malignant neoplasm of breast: Secondary | ICD-10-CM | POA: Diagnosis not present

## 2018-02-24 DIAGNOSIS — I447 Left bundle-branch block, unspecified: Secondary | ICD-10-CM | POA: Diagnosis not present

## 2018-02-24 DIAGNOSIS — Z171 Estrogen receptor negative status [ER-]: Secondary | ICD-10-CM | POA: Insufficient documentation

## 2018-02-24 HISTORY — DX: Essential (primary) hypertension: I10

## 2018-02-24 HISTORY — PX: LIPOSUCTION WITH LIPOFILLING: SHX6436

## 2018-02-24 HISTORY — PX: REMOVAL OF BILATERAL TISSUE EXPANDERS WITH PLACEMENT OF BILATERAL BREAST IMPLANTS: SHX6431

## 2018-02-24 LAB — GLUCOSE, CAPILLARY
Glucose-Capillary: 91 mg/dL (ref 70–99)
Glucose-Capillary: 137 mg/dL — ABNORMAL HIGH (ref 70–99)

## 2018-02-24 SURGERY — REMOVAL, TISSUE EXPANDER, BREAST, BILATERAL, WITH BILATERAL IMPLANT IMPLANT INSERTION
Anesthesia: General | Site: Chest | Laterality: Bilateral

## 2018-02-24 MED ORDER — GLYCOPYRROLATE PF 0.2 MG/ML IJ SOSY
PREFILLED_SYRINGE | INTRAMUSCULAR | Status: AC
  Filled 2018-02-24: qty 1

## 2018-02-24 MED ORDER — CHLORHEXIDINE GLUCONATE CLOTH 2 % EX PADS: 6.0000 | MEDICATED_PAD | Freq: Once | CUTANEOUS | Status: DC

## 2018-02-24 MED ORDER — SODIUM CHLORIDE 0.9 % IV SOLN
INTRAVENOUS | Status: DC | PRN
  Administered 2018-02-24: 25 ug/min via INTRAVENOUS

## 2018-02-24 MED ORDER — OXYCODONE HCL 5 MG/5ML PO SOLN: 5.0000 mg | Freq: Once | ORAL | Status: AC | PRN

## 2018-02-24 MED ORDER — LACTATED RINGERS IV SOLN
INTRAVENOUS | Status: DC
  Administered 2018-02-24 (×2): via INTRAVENOUS

## 2018-02-24 MED ORDER — ONDANSETRON HCL 4 MG/2ML IJ SOLN
INTRAMUSCULAR | Status: DC | PRN
  Administered 2018-02-24: 4 mg via INTRAVENOUS

## 2018-02-24 MED ORDER — FENTANYL CITRATE (PF) 100 MCG/2ML IJ SOLN
INTRAMUSCULAR | Status: DC | PRN
  Administered 2018-02-24: 100 ug via INTRAVENOUS

## 2018-02-24 MED ORDER — MEPERIDINE HCL 25 MG/ML IJ SOLN: 6.2500 mg | INTRAMUSCULAR | Status: DC | PRN

## 2018-02-24 MED ORDER — MIDAZOLAM HCL 2 MG/2ML IJ SOLN
INTRAMUSCULAR | Status: AC
  Filled 2018-02-24: qty 2

## 2018-02-24 MED ORDER — SUGAMMADEX SODIUM 200 MG/2ML IV SOLN
INTRAVENOUS | Status: DC | PRN
  Administered 2018-02-24: 200 mg via INTRAVENOUS

## 2018-02-24 MED ORDER — PROPOFOL 500 MG/50ML IV EMUL
INTRAVENOUS | Status: AC
  Filled 2018-02-24: qty 50

## 2018-02-24 MED ORDER — PHENYLEPHRINE HCL 10 MG/ML IJ SOLN
INTRAMUSCULAR | Status: DC | PRN
  Administered 2018-02-24 (×4): 80 ug via INTRAVENOUS

## 2018-02-24 MED ORDER — SODIUM BICARBONATE 4 % IV SOLN
INTRAVENOUS | Status: AC
  Filled 2018-02-24: qty 10

## 2018-02-24 MED ORDER — SCOPOLAMINE 1 MG/3DAYS TD PT72: 1.0000 | MEDICATED_PATCH | Freq: Once | TRANSDERMAL | Status: DC | PRN

## 2018-02-24 MED ORDER — CELECOXIB 200 MG PO CAPS
200.0000 mg | ORAL_CAPSULE | ORAL | Status: AC
  Administered 2018-02-24: 200 mg via ORAL

## 2018-02-24 MED ORDER — PHENYLEPHRINE 40 MCG/ML (10ML) SYRINGE FOR IV PUSH (FOR BLOOD PRESSURE SUPPORT)
PREFILLED_SYRINGE | INTRAVENOUS | Status: AC
  Filled 2018-02-24: qty 10

## 2018-02-24 MED ORDER — ACETAMINOPHEN 160 MG/5ML PO SOLN: 325.0000 mg | ORAL | Status: DC | PRN

## 2018-02-24 MED ORDER — SODIUM BICARBONATE 4 % IV SOLN
INTRAVENOUS | Status: DC | PRN
  Administered 2018-02-24: 400 mL via INTRAMUSCULAR

## 2018-02-24 MED ORDER — FENTANYL CITRATE (PF) 100 MCG/2ML IJ SOLN: 50.0000 ug | INTRAMUSCULAR | Status: DC | PRN

## 2018-02-24 MED ORDER — ONDANSETRON HCL 4 MG/2ML IJ SOLN
INTRAMUSCULAR | Status: AC
  Filled 2018-02-24: qty 2

## 2018-02-24 MED ORDER — MIDAZOLAM HCL 5 MG/5ML IJ SOLN
INTRAMUSCULAR | Status: DC | PRN
  Administered 2018-02-24: 2 mg via INTRAVENOUS

## 2018-02-24 MED ORDER — PROPOFOL 10 MG/ML IV BOLUS
INTRAVENOUS | Status: DC | PRN
  Administered 2018-02-24: 200 mg via INTRAVENOUS

## 2018-02-24 MED ORDER — FENTANYL CITRATE (PF) 100 MCG/2ML IJ SOLN
INTRAMUSCULAR | Status: AC
  Filled 2018-02-24: qty 2

## 2018-02-24 MED ORDER — GABAPENTIN 300 MG PO CAPS
300.0000 mg | ORAL_CAPSULE | ORAL | Status: AC
  Administered 2018-02-24: 300 mg via ORAL

## 2018-02-24 MED ORDER — ACETAMINOPHEN 500 MG PO TABS
1000.0000 mg | ORAL_TABLET | ORAL | Status: AC
  Administered 2018-02-24: 1000 mg via ORAL

## 2018-02-24 MED ORDER — DEXAMETHASONE SODIUM PHOSPHATE 10 MG/ML IJ SOLN
INTRAMUSCULAR | Status: AC
  Filled 2018-02-24: qty 1

## 2018-02-24 MED ORDER — EPINEPHRINE 30 MG/30ML IJ SOLN
INTRAMUSCULAR | Status: AC
  Filled 2018-02-24: qty 1

## 2018-02-24 MED ORDER — ROCURONIUM BROMIDE 50 MG/5ML IV SOSY
PREFILLED_SYRINGE | INTRAVENOUS | Status: AC
  Filled 2018-02-24: qty 5

## 2018-02-24 MED ORDER — OXYCODONE HCL 5 MG PO TABS
ORAL_TABLET | ORAL | Status: AC
  Filled 2018-02-24: qty 1

## 2018-02-24 MED ORDER — LIDOCAINE HCL (PF) 1 % IJ SOLN
INTRAMUSCULAR | Status: AC
  Filled 2018-02-24: qty 60

## 2018-02-24 MED ORDER — EPHEDRINE 5 MG/ML INJ
INTRAVENOUS | Status: AC
  Filled 2018-02-24: qty 10

## 2018-02-24 MED ORDER — MIDAZOLAM HCL 2 MG/2ML IJ SOLN: 1.0000 mg | INTRAMUSCULAR | Status: DC | PRN

## 2018-02-24 MED ORDER — FENTANYL CITRATE (PF) 100 MCG/2ML IJ SOLN
25.0000 ug | INTRAMUSCULAR | Status: DC | PRN
  Administered 2018-02-24: 25 ug via INTRAVENOUS
  Administered 2018-02-24: 50 ug via INTRAVENOUS

## 2018-02-24 MED ORDER — ACETAMINOPHEN 500 MG PO TABS
ORAL_TABLET | ORAL | Status: AC
  Filled 2018-02-24: qty 2

## 2018-02-24 MED ORDER — DEXAMETHASONE SODIUM PHOSPHATE 4 MG/ML IJ SOLN
INTRAMUSCULAR | Status: DC | PRN
  Administered 2018-02-24: 10 mg via INTRAVENOUS

## 2018-02-24 MED ORDER — GABAPENTIN 300 MG PO CAPS
ORAL_CAPSULE | ORAL | Status: AC
  Filled 2018-02-24: qty 1

## 2018-02-24 MED ORDER — LIDOCAINE HCL (CARDIAC) PF 100 MG/5ML IV SOSY
PREFILLED_SYRINGE | INTRAVENOUS | Status: DC | PRN
  Administered 2018-02-24: 30 mg via INTRAVENOUS

## 2018-02-24 MED ORDER — GLYCOPYRROLATE 0.2 MG/ML IJ SOLN
INTRAMUSCULAR | Status: DC | PRN
  Administered 2018-02-24: 0.2 mg via INTRAVENOUS

## 2018-02-24 MED ORDER — CEFAZOLIN SODIUM-DEXTROSE 2-4 GM/100ML-% IV SOLN
2.0000 g | INTRAVENOUS | Status: AC
  Administered 2018-02-24: 2 g via INTRAVENOUS

## 2018-02-24 MED ORDER — ONDANSETRON HCL 4 MG/2ML IJ SOLN: 4.0000 mg | Freq: Once | INTRAMUSCULAR | Status: DC | PRN

## 2018-02-24 MED ORDER — ROCURONIUM BROMIDE 100 MG/10ML IV SOLN
INTRAVENOUS | Status: DC | PRN
  Administered 2018-02-24: 50 mg via INTRAVENOUS

## 2018-02-24 MED ORDER — LIDOCAINE 2% (20 MG/ML) 5 ML SYRINGE
INTRAMUSCULAR | Status: AC
  Filled 2018-02-24: qty 5

## 2018-02-24 MED ORDER — ACETAMINOPHEN 325 MG PO TABS: 325.0000 mg | ORAL_TABLET | ORAL | Status: DC | PRN

## 2018-02-24 MED ORDER — OXYCODONE HCL 5 MG PO TABS
5.0000 mg | ORAL_TABLET | Freq: Once | ORAL | Status: AC | PRN
  Administered 2018-02-24: 5 mg via ORAL

## 2018-02-24 MED ORDER — SODIUM CHLORIDE 0.9 % IV SOLN
INTRAVENOUS | Status: DC | PRN
  Administered 2018-02-24: 1000 mL

## 2018-02-24 MED ORDER — PROPOFOL 500 MG/50ML IV EMUL
INTRAVENOUS | Status: DC | PRN
  Administered 2018-02-24: 25 ug/kg/min via INTRAVENOUS

## 2018-02-24 MED ORDER — EPHEDRINE SULFATE 50 MG/ML IJ SOLN
INTRAMUSCULAR | Status: DC | PRN
  Administered 2018-02-24: 25 mg via INTRAVENOUS

## 2018-02-24 MED ORDER — CELECOXIB 200 MG PO CAPS
ORAL_CAPSULE | ORAL | Status: AC
  Filled 2018-02-24: qty 1

## 2018-02-24 MED ORDER — CEFAZOLIN SODIUM-DEXTROSE 2-4 GM/100ML-% IV SOLN
INTRAVENOUS | Status: AC
  Filled 2018-02-24: qty 100

## 2018-02-24 MED ORDER — PHENYLEPHRINE HCL 10 MG/ML IJ SOLN
INTRAMUSCULAR | Status: AC
  Filled 2018-02-24: qty 1

## 2018-02-24 SURGICAL SUPPLY — 83 items
ADH SKN CLS APL DERMABOND .7 (GAUZE/BANDAGES/DRESSINGS) ×6
BAG DECANTER FOR FLEXI CONT (MISCELLANEOUS) ×4 IMPLANT
BINDER ABDOMINAL 10 UNV 27-48 (MISCELLANEOUS) IMPLANT
BINDER ABDOMINAL 12 SM 30-45 (SOFTGOODS) ×2 IMPLANT
BINDER BREAST 3XL (GAUZE/BANDAGES/DRESSINGS) IMPLANT
BINDER BREAST LRG (GAUZE/BANDAGES/DRESSINGS) IMPLANT
BINDER BREAST MEDIUM (GAUZE/BANDAGES/DRESSINGS) IMPLANT
BINDER BREAST XLRG (GAUZE/BANDAGES/DRESSINGS) IMPLANT
BINDER BREAST XXLRG (GAUZE/BANDAGES/DRESSINGS) ×2 IMPLANT
BLADE SURG 10 STRL SS (BLADE) ×10 IMPLANT
BLADE SURG 11 STRL SS (BLADE) ×4 IMPLANT
BNDG GAUZE ELAST 4 BULKY (GAUZE/BANDAGES/DRESSINGS) ×8 IMPLANT
CANISTER LIPO FAT HARVEST (MISCELLANEOUS) ×4 IMPLANT
CANISTER SUCT 1200ML W/VALVE (MISCELLANEOUS) ×4 IMPLANT
CHLORAPREP W/TINT 26ML (MISCELLANEOUS) ×6 IMPLANT
COVER BACK TABLE 60X90IN (DRAPES) ×4 IMPLANT
COVER MAYO STAND STRL (DRAPES) ×4 IMPLANT
COVER WAND RF STERILE (DRAPES) IMPLANT
DECANTER SPIKE VIAL GLASS SM (MISCELLANEOUS) IMPLANT
DERMABOND ADVANCED (GAUZE/BANDAGES/DRESSINGS) ×6
DERMABOND ADVANCED .7 DNX12 (GAUZE/BANDAGES/DRESSINGS) ×4 IMPLANT
DRAIN CHANNEL 15F RND FF W/TCR (WOUND CARE) IMPLANT
DRAPE TOP ARMCOVERS (MISCELLANEOUS) ×4 IMPLANT
DRAPE U-SHAPE 76X120 STRL (DRAPES) ×4 IMPLANT
DRSG PAD ABDOMINAL 8X10 ST (GAUZE/BANDAGES/DRESSINGS) ×12 IMPLANT
ELECT BLADE 4.0 EZ CLEAN MEGAD (MISCELLANEOUS)
ELECT COATED BLADE 2.86 ST (ELECTRODE) ×4 IMPLANT
ELECT REM PT RETURN 9FT ADLT (ELECTROSURGICAL) ×4
ELECTRODE BLDE 4.0 EZ CLN MEGD (MISCELLANEOUS) ×2 IMPLANT
ELECTRODE REM PT RTRN 9FT ADLT (ELECTROSURGICAL) ×2 IMPLANT
EVACUATOR SILICONE 100CC (DRAIN) IMPLANT
GLOVE BIO SURGEON STRL SZ 6 (GLOVE) ×10 IMPLANT
GLOVE BIO SURGEON STRL SZ7 (GLOVE) ×2 IMPLANT
GLOVE BIOGEL PI IND STRL 7.0 (GLOVE) IMPLANT
GLOVE BIOGEL PI IND STRL 7.5 (GLOVE) IMPLANT
GLOVE BIOGEL PI INDICATOR 7.0 (GLOVE) ×2
GLOVE BIOGEL PI INDICATOR 7.5 (GLOVE) ×2
GOWN STRL REUS W/ TWL LRG LVL3 (GOWN DISPOSABLE) ×4 IMPLANT
GOWN STRL REUS W/TWL LRG LVL3 (GOWN DISPOSABLE) ×8
IMPL BREAST P6.7XRND MDRT 750 (Breast) IMPLANT
IMPL BRST P6.7XRND MDRT 750CC (Breast) ×4 IMPLANT
IMPLANT BREAST GEL 750CC (Breast) ×8 IMPLANT
IV NS 500ML (IV SOLUTION)
IV NS 500ML BAXH (IV SOLUTION) IMPLANT
KIT FILL SYSTEM UNIVERSAL (SET/KITS/TRAYS/PACK) IMPLANT
LINER CANISTER 1000CC FLEX (MISCELLANEOUS) ×4 IMPLANT
MARKER SKIN DUAL TIP RULER LAB (MISCELLANEOUS) IMPLANT
NDL HYPO 25X1 1.5 SAFETY (NEEDLE) IMPLANT
NDL SAFETY ECLIPSE 18X1.5 (NEEDLE) ×2 IMPLANT
NEEDLE HYPO 18GX1.5 SHARP (NEEDLE) ×8
NEEDLE HYPO 25X1 1.5 SAFETY (NEEDLE) IMPLANT
NS IRRIG 1000ML POUR BTL (IV SOLUTION) ×2 IMPLANT
PACK BASIN DAY SURGERY FS (CUSTOM PROCEDURE TRAY) ×4 IMPLANT
PAD ALCOHOL SWAB (MISCELLANEOUS) ×4 IMPLANT
PENCIL BUTTON HOLSTER BLD 10FT (ELECTRODE) ×4 IMPLANT
PIN SAFETY STERILE (MISCELLANEOUS) IMPLANT
PUNCH BIOPSY DERMAL 4MM (MISCELLANEOUS) IMPLANT
SHEET MEDIUM DRAPE 40X70 STRL (DRAPES) ×8 IMPLANT
SIZER BREAST REUSE 750CC (SIZER) ×4
SIZER BRST REUSE P6.7X 750CC (SIZER) IMPLANT
SLEEVE SCD COMPRESS KNEE MED (MISCELLANEOUS) ×4 IMPLANT
SPONGE LAP 18X18 RF (DISPOSABLE) ×10 IMPLANT
STAPLER VISISTAT 35W (STAPLE) ×4 IMPLANT
SUT ETHILON 2 0 FS 18 (SUTURE) IMPLANT
SUT MNCRL AB 4-0 PS2 18 (SUTURE) ×8 IMPLANT
SUT PDS AB 2-0 CT2 27 (SUTURE) IMPLANT
SUT VIC AB 3-0 PS1 18 (SUTURE)
SUT VIC AB 3-0 PS1 18XBRD (SUTURE) IMPLANT
SUT VIC AB 3-0 SH 27 (SUTURE) ×8
SUT VIC AB 3-0 SH 27X BRD (SUTURE) ×4 IMPLANT
SUT VICRYL 4-0 PS2 18IN ABS (SUTURE) ×8 IMPLANT
SYR 10ML LL (SYRINGE) ×14 IMPLANT
SYR 50ML LL SCALE MARK (SYRINGE) ×18 IMPLANT
SYR BULB IRRIGATION 50ML (SYRINGE) ×8 IMPLANT
SYR CONTROL 10ML LL (SYRINGE) IMPLANT
SYR TB 1ML LL NO SAFETY (SYRINGE) ×2 IMPLANT
TOWEL GREEN STERILE FF (TOWEL DISPOSABLE) ×8 IMPLANT
TUBE CONNECTING 20'X1/4 (TUBING) ×2
TUBE CONNECTING 20X1/4 (TUBING) ×4 IMPLANT
TUBING INFILTRATION IT-10001 (TUBING) ×4 IMPLANT
TUBING SET GRADUATE ASPIR 12FT (MISCELLANEOUS) ×4 IMPLANT
UNDERPAD 30X30 (UNDERPADS AND DIAPERS) ×8 IMPLANT
YANKAUER SUCT BULB TIP NO VENT (SUCTIONS) ×4 IMPLANT

## 2018-02-24 NOTE — Transfer of Care (Signed)
Immediate Anesthesia Transfer of Care Note  Patient: Rebecca [name]  Procedure(s) Performed: REMOVAL OF BILATERAL TISSUE EXPANDERS WITH PLACEMENT OF BILATERAL BREAST IMPLANTS (Bilateral Breast) LIPOFILLING FROM ABDOMEN TO BILATERAL CHEST (Bilateral Chest)  Patient Location: PACU  Anesthesia Type:General  Level of Consciousness: awake and patient cooperative  Airway & Oxygen Therapy: Patient Spontanous Breathing and Patient connected to face mask oxygen  Post-op Assessment: Report given to RN and Post -op Vital signs reviewed and stable  Post vital signs: Reviewed and stable  Last Vitals:  Vitals Value Taken Time  BP    Temp    Pulse 88 02/24/2018 10:10 AM  Resp 18 02/24/2018 10:10 AM  SpO2 100 % 02/24/2018 10:10 AM  Vitals shown include unvalidated device data.  Last Pain:  Vitals:   02/24/18 0654  TempSrc: Oral  PainSc: 0-No pain      Patients Stated Pain Goal: 1 (54/98/26 [id number])  Complications: No apparent anesthesia complications

## 2018-02-24 NOTE — Discharge Instructions (Signed)

## 2018-02-24 NOTE — Anesthesia Procedure Notes (Signed)
Procedure Name: Intubation Date/Time: 02/24/2018 7:42 AM Performed by: Marrianne Mood, CRNA Pre-anesthesia Checklist: Patient identified, Emergency Drugs available, Suction available, Patient being monitored and Timeout performed Patient Re-evaluated:Patient Re-evaluated prior to induction Oxygen Delivery Method: Circle system utilized Preoxygenation: Pre-oxygenation with 100% oxygen Induction Type: IV induction Ventilation: Mask ventilation without difficulty Laryngoscope Size: Miller and 3 Grade View: Grade II Tube type: Oral Tube size: 7.0 mm Number of attempts: 1 Airway Equipment and Method: Stylet and Oral airway Placement Confirmation: ETT inserted through vocal cords under direct vision,  positive ETCO2 and breath sounds checked- equal and bilateral Secured at: 22 cm Tube secured with: Tape Dental Injury: Teeth and Oropharynx as per pre-operative assessment

## 2018-02-24 NOTE — Op Note (Signed)
Operative Note   DATE OF OPERATION: 10.29.19  LOCATION: [facility]-outpatient  SURGICAL DIVISION: Plastic Surgery  PREOPERATIVE DIAGNOSES:  1. History breast cancer 2. Acquired absence bilateral breasts  POSTOPERATIVE DIAGNOSES:  same  PROCEDURE:  1. Removal bilateral tissue expanders and placement silicone implants 2. Lipofilling bilateral chest  SURGEON: Irene Limbo MD MBA  ASSISTANT: none  ANESTHESIA:  General.   EBL: 50 ml  COMPLICATIONS: None immediate.   INDICATIONS FOR PROCEDURE:  The patient, Rebecca [name], is a 47 y.o. female born on 1970/10/07, is here for staged breast reconstruction following bilateral skin reduction pattern mastectomies with immediate prepectoral tissue expander acellular dermis reconstruction.    FINDINGS: Complete incorporation ADM bilateral. 50 ml fat infiltrated over right chest, 70 ml fat over left chest. Natrelle Inspira Smooth Round Extra Projection 750 ml implants placed bilateral. REF SRX-750 RIGHT SN [id number] LEFT SN [id number]  DESCRIPTION OF PROCEDURE:  The patient's operative site was marked with the patient in the preoperative area to mark sternal notch, chest midline, anterior axillary lines.Bilateral lateral chest wall and bilateral flanks marked for liposuction.The patientwas taken to the operating room. SCDs were placed and IV antibiotics were given. The patient's operative site was prepped and draped in a sterile fashion. A time out was performed and all information was confirmed to be correct.I began onrightside. Incision made through prior inframammary fold scar and carried through superficial fascia to acellular demis. ADM incised. Expander removed. Capsulotomies performedover medial inner quadrant. Sizer placed.  I then directed attention toleftbreast and implant cavity entered in similar manner. Expander removed and well incorporated ADM noted. Capsulotomies performed superiorly. Anterior ADM scored.Sizer  placed and patient brought to upright sitting position. Natrelle Smooth Round Extra Projection 750 ml implants selected for bilateral placement.The patient was returned to supine position.   Stab incision made over bilateralabdomenand lateral extent chest IMF scars. Tumescent fluid infiltrated over bilateral flanks and lateral chest wall,total434ml tumescent infiltrated. Power assisted liposuction performed to endpoint symmetric contour and soft tissue thickness, total lipoaspirate550 ml. The fat was then washed and prepared by gravity for infiltration. Harvested fat was then infiltrated in subcutaneous plane throughout total envelope mastectomy flaps.  Each cavity irrigated with bacitracin, Ancef, gentamicinsolution.Hemostasis ensured.Each cavity then irrigated with Betadine. The implant was placed inright chest andimplant orientation ensured. Closure completed with 3-0 vicryl to close superficial fascia and ADM over implant. 4-0 vicryl used to close dermis followed by 4-0 monocryl subcuticular. Implant placed in left chest cavity.Closure completedin similar fashion.Abdomen incisions approximated with simple 4-0 monocryl stitch.Tissue adhesive and dry dressing applied, followed by breastbinder and abdominal compression garment.  The patient was allowed to wake from anesthesia, extubated and taken to the recovery room in satisfactory condition.   SPECIMENS: none  DRAINS: none  Irene Limbo, MD [facility] Plastic & Reconstructive Surgery [phone], pin [phone]

## 2018-02-24 NOTE — Interval H&P Note (Signed)
History and Physical Interval Note:  02/24/2018 6:53 AM  Rebecca [name]  has presented today for surgery, with the diagnosis of HISTORY BREAST CANCER, ACQUIRED ABSENCE BREASTS  The various methods of treatment have been discussed with the patient and family. After consideration of risks, benefits and other options for treatment, the patient has consented to  Removal of bilateral tissue expander, placement silicone implants, lipofilling to bilateral chest as a surgical intervention .  The patient's history has been reviewed, patient examined, no change in status, stable for surgery.  I have reviewed the patient's chart and labs.  Questions were answered to the patient's satisfaction.     Arnoldo Hooker Kiearra Oyervides

## 2018-02-24 NOTE — Anesthesia Postprocedure Evaluation (Signed)
Anesthesia Post Note  Patient: Rebecca [name]  Procedure(s) Performed: REMOVAL OF BILATERAL TISSUE EXPANDERS WITH PLACEMENT OF BILATERAL BREAST IMPLANTS (Bilateral Breast) LIPOFILLING FROM ABDOMEN TO BILATERAL CHEST (Bilateral Chest)     Patient location during evaluation: PACU Anesthesia Type: General Level of consciousness: awake and alert Pain management: pain level controlled Vital Signs Assessment: post-procedure vital signs reviewed and stable Respiratory status: spontaneous breathing, nonlabored ventilation, respiratory function stable and patient connected to nasal cannula oxygen Cardiovascular status: blood pressure returned to baseline and stable Postop Assessment: no apparent nausea or vomiting Anesthetic complications: no    Last Vitals:  Vitals:   02/24/18 1100 02/24/18 1130  BP: (!) 121/96 134/84  Pulse: 79 94  Resp: 13 16  Temp:  36.7 C  SpO2: 100% 97%    Last Pain:  Vitals:   02/24/18 1130  TempSrc:   PainSc: 4                  Jordie Schreur

## 2018-02-25 NOTE — Addendum Note (Signed)
Addendum  created 02/25/18 0853 by Tawni Millers, CRNA   Charge Capture section accepted

## 2018-02-26 ENCOUNTER — Encounter (HOSPITAL_BASED_OUTPATIENT_CLINIC_OR_DEPARTMENT_OTHER): Payer: Self-pay | Admitting: Plastic Surgery

## 2018-03-09 ENCOUNTER — Other Ambulatory Visit: Payer: Self-pay

## 2018-03-09 DIAGNOSIS — Z171 Estrogen receptor negative status [ER-]: Principal | ICD-10-CM

## 2018-03-09 DIAGNOSIS — C50411 Malignant neoplasm of upper-outer quadrant of right female breast: Secondary | ICD-10-CM

## 2018-03-09 NOTE — Progress Notes (Signed)
Patient Care Team: Deland Pretty, MD as PCP - General (Internal Medicine) Baxley, Cresenciano Lick, MD (Internal Medicine)  DIAGNOSIS:    ICD-10-CM   1. Malignant neoplasm of upper-outer quadrant of right breast in female, estrogen receptor negative ([place]) C50.411    Z17.1     SUMMARY OF ONCOLOGIC HISTORY:   Malignant neoplasm of upper-outer quadrant of right breast in female, estrogen receptor negative ([place])   07/09/2017 Initial Diagnosis    Patient palpated a right breast mass which was evaluated by mammogram and ultrasound and a breast MRI which revealed 4.5 x 4 cm necrotic tumor with suspicious multiple right axillary lymph nodes; biopsy revealed IDC grade 3 ER 0%, PR 0%, HER-2 positive ratio 3.08, Ki-67 70%, T2NX stage IIa/IIb    07/18/2017 - 11/04/2017 Neo-Adjuvant Chemotherapy    TCH Perjeta x6 cycles followed by Herceptin Perjeta maintenance    08/15/2017 Genetic Testing    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  The Multi-Gene Panel offered by [facility] includes sequencing and/or deletion duplication testing of the following 83 genes: ALK, APC, ATM, AXIN2,BAP1,  BARD1, BLM, BMPR1A, BRCA1, BRCA2, BRIP1, CASR, CDC73, CDH1, CDK4, CDKN1B, CDKN1C, CDKN2A (p14ARF), CDKN2A (p16INK4a), CEBPA, CHEK2, CTNNA1, DICER1, DIS3L2, EGFR (c.2369C>T, p.Thr790Met variant only), EPCAM (Deletion/duplication testing only), FH, FLCN, GATA2, GPC3, GREM1 (Promoter region deletion/duplication testing only), HOXB13 (c.251G>A, p.Gly84Glu), HRAS, KIT, MAX, MEN1, MET, MITF (c.952G>A, p.Glu318Lys variant only), MLH1, MSH2, MSH3, MSH6, MUTYH, NBN, NF1, NF2, NTHL1, PALB2, PDGFRA, PHOX2B, PMS2, POLD1, POLE, POT1, PRKAR1A, PTCH1, PTEN, RAD50, RAD51C, RAD51D, RB1, RECQL4, RET, RUNX1, SDHAF2, SDHA (sequence changes only), SDHB, SDHC, SDHD, SMAD4, SMARCA4, SMARCB1, SMARCE1, STK11, SUFU, TERT, TERT, TMEM127, TP53, TSC1, TSC2, VHL, WRN and WT1.  The report date is August 15, 2017.    11/18/2017 Surgery   Bilateral mastectomies: Right mastectomy: IDC grade 3 3.8 cm margins negative, 0/4 lymph nodes negative, ER 0%, PR 0%, HER-2 negative ratio 1.3, IHC HER-2 negative; Ki-67 70%, RCB class II; [facility] mastectomy: [facility]    12/18/2017 - 01/27/2018 Chemotherapy    Adjuvant chemotherapy with dose dense Adriamycin and Cytoxan x4    02/17/2018 -  Chemotherapy    Maintenance Herceptin and Perjeta    02/24/2018 Surgery    REMOVAL OF BILATERAL TISSUE EXPANDERS WITH PLACEMENT OF BILATERAL BREAST IMPLANTS and LIPOFILLING FROM ABDOMEN TO BILATERAL CHEST by Dr. Iran Planas      CHIEF COMPLIANT: Follow up of maintenance Herceptin and Perjeta  INTERVAL HISTORY: Rebecca [name] is a 47 y.o. with above-mentioned history of HER-2 positive breast cancer who is here to receive Herceptin and Perjeta maintenance. Since her last visit she underwent breast reconstruction surgery with Dr. Iran Planas on 02/24/18. She continues to have diarrhea related to prior for Herceptin and Perjeta.  She is taking 4 tablets of Imodium and 4 tablets of Lomotil and this has been helping her diarrhea.  She is going to see Dr. Michiel Sites regarding her multiple metabolic issues including weight gain, no diabetes that is not well controlled and she has a plan to get back to her previous normal appearance by losing weight and up with physical exercise and monitoring her diet.  REVIEW OF SYSTEMS:   Constitutional: Denies fevers, chills or abnormal weight loss Eyes: Denies blurriness of vision Ears, nose, mouth, throat, and face: Denies mucositis or sore throat Respiratory: Denies cough, dyspnea or wheezes Cardiovascular: Denies palpitation, chest discomfort Gastrointestinal:  Denies nausea, heartburn or change in bowel habits Skin: Denies abnormal skin rashes Lymphatics: Denies new lymphadenopathy or  easy bruising Neurological:Denies numbness, tingling or new weaknesses Behavioral/Psych: Mood is stable, no new changes  Extremities: No lower  extremity edema Breast: Recent breast reconstruction All other systems were reviewed with the patient and are negative.  I have reviewed the past medical history, past surgical history, social history and family history with the patient and they are unchanged from previous note.  ALLERGIES:  has No Known Allergies.  MEDICATIONS:  Current Outpatient Medications  Medication Sig Dispense Refill  . carvedilol (COREG) 3.125 MG tablet Take 1 tablet (3.125 mg total) by mouth 2 (two) times daily. 60 tablet 3  . diphenoxylate-atropine (LOMOTIL) 2.5-0.025 MG tablet Take 1 tablet by mouth 4 (four) times daily as needed for diarrhea or loose stools. 30 tablet 0  . esomeprazole (NEXIUM) 40 MG capsule Take 1 capsule (40 mg total) by mouth daily at 12 noon. 30 capsule 3  . levothyroxine (SYNTHROID, LEVOTHROID) 137 MCG tablet Take 137 mcg by mouth daily before breakfast.    . LORazepam (ATIVAN) 1 MG tablet Take 1 tablet (1 mg total) by mouth every 8 (eight) hours as needed (nausea and vomiting). 30 tablet 0  . losartan (COZAAR) 25 MG tablet TAKE 1 TABLET BY MOUTH EVERY DAY 30 tablet 5  . metFORMIN (GLUCOPHAGE) 500 MG tablet Take by mouth daily with breakfast.    . triamterene-hydrochlorothiazide (MAXZIDE) 75-50 MG tablet One po daily prn lower extremity edema 30 tablet 2   No current facility-administered medications for this visit.    Facility-Administered Medications Ordered in Other Visits  Medication Dose Route Frequency Provider Last Rate Last Dose  . alteplase (CATHFLO ACTIVASE) injection 2 mg  2 mg Intracatheter Once PRN Nicholas Lose, MD      . LORazepam (ATIVAN) injection 1 mg  1 mg Intravenous Once Sandi Mealy E., PA-C      . sodium chloride flush (NS) 0.9 % injection 10 mL  10 mL Intracatheter PRN Nicholas Lose, MD   10 mL at 03/10/18 0901    PHYSICAL EXAMINATION: ECOG PERFORMANCE STATUS: 1 - Symptomatic but completely ambulatory  Vitals:   03/10/18 0919  BP: 120/83  Pulse: 72  Resp:  18  Temp: 98 F (36.7 C)  SpO2: 96%   Filed Weights   03/10/18 0919  Weight: 235 lb 3.2 oz (106.7 kg)    GENERAL:alert, no distress and comfortable SKIN: skin color, texture, turgor are normal, no rashes or significant lesions EYES: normal, Conjunctiva are pink and non-injected, sclera clear OROPHARYNX:no exudate, no erythema and lips, buccal mucosa, and tongue normal  NECK: supple, thyroid normal size, non-tender, without nodularity LYMPH:  no palpable lymphadenopathy in the cervical, axillary or inguinal LUNGS: clear to auscultation and percussion with normal breathing effort HEART: regular rate & rhythm and no murmurs and no lower extremity edema ABDOMEN:abdomen soft, non-tender and normal bowel sounds MUSCULOSKELETAL:no cyanosis of digits and no clubbing  NEURO: alert & oriented x 3 with fluent speech, no focal motor/sensory deficits EXTREMITIES: No lower extremity edema   LABORATORY DATA:  I have reviewed the data as listed CMP Latest Ref Rng & Units 03/10/2018 02/17/2018 01/27/2018  Glucose 70 - 99 mg/dL 103(H) 104(H) 144(H)  BUN 6 - 20 mg/dL _0 Creatinine 0.44 - 1.00 mg/dL 0.80 0.78 0.79  Sodium 135 - 145 mmol/L 142 140 142  Potassium 3.5 - 5.1 mmol/L 4.5 4.3 4.3  Chloride 98 - 111 mmol/L 106 104 106  CO2 22 - 32 mmol/L _1 Calcium 8.9 - 10.3  mg/dL 9.7 9.5 9.3  Total Protein 6.5 - 8.1 g/dL 6.7 6.9 6.7  Total Bilirubin 0.3 - 1.2 mg/dL 0.3 0.4 0.3  Alkaline Phos 38 - 126 U/L 71 68 79  AST 15 - 41 U/L 17 13(L) 14(L)  ALT 0 - 44 U/L _0 Lab Results  Component Value Date   WBC 4.9 03/10/2018   HGB 9.9 (L) 03/10/2018   HCT 32.7 (L) 03/10/2018   MCV 90.6 03/10/2018   PLT 280 03/10/2018   NEUTROABS 3.3 03/10/2018    ASSESSMENT & PLAN:  Malignant neoplasm of upper-outer quadrant of right breast in female, estrogen receptor negative ([place]) 07/09/2017:Patient palpated a right breast mass which was evaluated by mammogram and ultrasound and a breast  MRI which revealed 4.5 x 4 cm necrotic tumor with suspicious multiple right axillary lymph nodes; biopsy revealed IDC grade 3 ER 0%, PR 0%, HER-2 positive ratio 3.08, Ki-67 70%, T2NX stage IIa/IIb  Treatment planbased on multidisciplinary tumor board: 1. Neoadjuvant chemotherapy with TCH Perjeta 6 cycles followed by Herceptinand Perjetamaintenance for 1 year 2. Followed bybilateral mastectomies with targeted node dissection on the right7/23/2019 3.Due to significant residual disease, additional adjuvant chemo with Adriamycin and Cytoxan 12/18/2017-01/27/2018 4. Followed by adjuvant radiation therapy ----------------------------------------------------------------------------- 11/18/2017:Bilateral mastectomies: Right mastectomy: IDC grade 3 3.8 cm margins negative, 0/4 lymph nodes negative, ER 0%, PR 0%, HER-2 negative ratio 1.3, IHC HER-2 negative; Ki-67 70%, RCB class II; left mastectomy: [facility]  Current treatment: Herceptin  Maintenance (Perjeta discontinued for diarrhea) Severe diarrhea: She takes 4 tablets of Imodium and 4 tablets of Lomotil.  This is been helping her diarrhea.  Since the Perjeta is being discontinued, we are hoping to see that the diarrhea gets better without requiring that much medication.  Echocardiogram 02/13/2018: EF 50 to 55% unchanged Return to clinic every 3 weeks for Herceptin  No orders of the defined types were placed in this encounter.  The patient has a good understanding of the overall plan. she agrees with it. she will call with any problems that may develop before the next visit here.  Nicholas Lose, MD 03/10/2018  Oneal Deputy, am acting as scribe for Nicholas Lose, MD.  I have reviewed the above documentation for accuracy and completeness, and I agree with the above.

## 2018-03-10 ENCOUNTER — Inpatient Hospital Stay: Payer: BLUE CROSS/BLUE SHIELD | Attending: Hematology and Oncology

## 2018-03-10 ENCOUNTER — Inpatient Hospital Stay: Payer: BLUE CROSS/BLUE SHIELD

## 2018-03-10 ENCOUNTER — Inpatient Hospital Stay (HOSPITAL_BASED_OUTPATIENT_CLINIC_OR_DEPARTMENT_OTHER): Payer: BLUE CROSS/BLUE SHIELD | Admitting: Hematology and Oncology

## 2018-03-10 DIAGNOSIS — Z95828 Presence of other vascular implants and grafts: Secondary | ICD-10-CM

## 2018-03-10 DIAGNOSIS — Z171 Estrogen receptor negative status [ER-]: Principal | ICD-10-CM

## 2018-03-10 DIAGNOSIS — Z9013 Acquired absence of bilateral breasts and nipples: Secondary | ICD-10-CM | POA: Insufficient documentation

## 2018-03-10 DIAGNOSIS — Z79899 Other long term (current) drug therapy: Secondary | ICD-10-CM | POA: Diagnosis not present

## 2018-03-10 DIAGNOSIS — C50411 Malignant neoplasm of upper-outer quadrant of right female breast: Secondary | ICD-10-CM | POA: Insufficient documentation

## 2018-03-10 DIAGNOSIS — Z9221 Personal history of antineoplastic chemotherapy: Secondary | ICD-10-CM | POA: Insufficient documentation

## 2018-03-10 DIAGNOSIS — Z5112 Encounter for antineoplastic immunotherapy: Secondary | ICD-10-CM | POA: Diagnosis not present

## 2018-03-10 LAB — CBC WITH DIFFERENTIAL (CANCER CENTER ONLY)
Abs Immature Granulocytes: 0.01 10*3/uL (ref 0.00–0.07)
BASOS ABS: 0 10*3/uL (ref 0.0–0.1)
BASOS PCT: 1 %
EOS ABS: 0.1 10*3/uL (ref 0.0–0.5)
EOS PCT: 3 %
HEMATOCRIT: 32.7 % — AB (ref 36.0–46.0)
Hemoglobin: 9.9 g/dL — ABNORMAL LOW (ref 12.0–15.0)
Immature Granulocytes: 0 %
LYMPHS ABS: 0.9 10*3/uL (ref 0.7–4.0)
Lymphocytes Relative: 18 %
MCH: 27.4 pg (ref 26.0–34.0)
MCHC: 30.3 g/dL (ref 30.0–36.0)
MCV: 90.6 fL (ref 80.0–100.0)
Monocytes Absolute: 0.5 10*3/uL (ref 0.1–1.0)
Monocytes Relative: 10 %
NRBC: 0 % (ref 0.0–0.2)
Neutro Abs: 3.3 10*3/uL (ref 1.7–7.7)
Neutrophils Relative %: 68 %
Platelet Count: 280 10*3/uL (ref 150–400)
RBC: 3.61 MIL/uL — ABNORMAL LOW (ref 3.87–5.11)
RDW: 17.1 % — AB (ref 11.5–15.5)
WBC Count: 4.9 10*3/uL (ref 4.0–10.5)

## 2018-03-10 LAB — CMP (CANCER CENTER ONLY)
ALT: 19 U/L (ref 0–44)
AST: 17 U/L (ref 15–41)
Albumin: 3.8 g/dL (ref 3.5–5.0)
Alkaline Phosphatase: 71 U/L (ref 38–126)
Anion gap: 9 (ref 5–15)
BILIRUBIN TOTAL: 0.3 mg/dL (ref 0.3–1.2)
BUN: 13 mg/dL (ref 6–20)
CO2: 27 mmol/L (ref 22–32)
CREATININE: 0.8 mg/dL (ref 0.44–1.00)
Calcium: 9.7 mg/dL (ref 8.9–10.3)
Chloride: 106 mmol/L (ref 98–111)
Glucose, Bld: 103 mg/dL — ABNORMAL HIGH (ref 70–99)
Potassium: 4.5 mmol/L (ref 3.5–5.1)
Sodium: 142 mmol/L (ref 135–145)
Total Protein: 6.7 g/dL (ref 6.5–8.1)

## 2018-03-10 MED ORDER — SODIUM CHLORIDE 0.9% FLUSH
10.0000 mL | INTRAVENOUS | Status: DC | PRN
  Administered 2018-03-10: 10 mL
  Filled 2018-03-10: qty 10

## 2018-03-10 MED ORDER — DIPHENHYDRAMINE HCL 25 MG PO CAPS
ORAL_CAPSULE | ORAL | Status: AC
  Filled 2018-03-10: qty 2

## 2018-03-10 MED ORDER — HEPARIN SOD (PORK) LOCK FLUSH 100 UNIT/ML IV SOLN
500.0000 [IU] | Freq: Once | INTRAVENOUS | Status: AC | PRN
  Administered 2018-03-10: 500 [IU]
  Filled 2018-03-10: qty 5

## 2018-03-10 MED ORDER — ALTEPLASE 2 MG IJ SOLR
2.0000 mg | Freq: Once | INTRAMUSCULAR | Status: DC | PRN
  Filled 2018-03-10: qty 2

## 2018-03-10 MED ORDER — TRASTUZUMAB CHEMO 150 MG IV SOLR
600.0000 mg | Freq: Once | INTRAVENOUS | Status: AC
  Administered 2018-03-10: 600 mg via INTRAVENOUS
  Filled 2018-03-10: qty 28.57

## 2018-03-10 MED ORDER — ACETAMINOPHEN 325 MG PO TABS
ORAL_TABLET | ORAL | Status: AC
  Filled 2018-03-10: qty 2

## 2018-03-10 MED ORDER — SODIUM CHLORIDE 0.9 % IV SOLN
Freq: Once | INTRAVENOUS | Status: AC
  Administered 2018-03-10: 10:00:00 via INTRAVENOUS
  Filled 2018-03-10: qty 250

## 2018-03-10 MED ORDER — ALTEPLASE 2 MG IJ SOLR
INTRAMUSCULAR | Status: AC
  Filled 2018-03-10: qty 2

## 2018-03-10 MED ORDER — ACETAMINOPHEN 325 MG PO TABS
650.0000 mg | ORAL_TABLET | Freq: Once | ORAL | Status: AC
  Administered 2018-03-10: 650 mg via ORAL

## 2018-03-10 MED ORDER — DIPHENHYDRAMINE HCL 25 MG PO CAPS
50.0000 mg | ORAL_CAPSULE | Freq: Once | ORAL | Status: AC
  Administered 2018-03-10: 50 mg via ORAL

## 2018-03-10 NOTE — Assessment & Plan Note (Signed)
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 Neoadjuvant chemotherapy with TCH Perjeta 6 cycles followed by Herceptinand Perjetamaintenance for 1 year 2. Followed bybilateral mastectomies with targeted node dissection on the right7/23/2019 3.Due to significant residual disease, additional adjuvant chemo with Adriamycin and Cytoxan 12/18/2017-01/27/2018 4. Followed by adjuvant radiation therapy ----------------------------------------------------------------------------- 11/18/2017:Bilateral mastectomies: Right mastectomy: IDC grade 3 3.8 cm margins negative, 0/4 lymph nodes negative, ER 0%, PR 0%, HER-2 negative ratio 1.3, IHC HER-2 negative; Ki-67 70%, RCB class II; left mastectomy: [facility]  Current treatment: Herceptin  Maintenance (Perjeta discontinued for diarrhea)  Echocardiogram 02/13/2018: EF 50 to 55% unchanged  Return to clinic every 3 weeks for Herceptin and Perjeta in every 6 weeks for follow-up with me.

## 2018-03-10 NOTE — Patient Instructions (Signed)
[facility] Discharge Instructions for Patients Receiving Chemotherapy  Today you received the following chemotherapy agents Doxorubicin (Adriamycin) & Cyclophosphamide (Cytoxan).  To help prevent nausea and vomiting after your treatment, we encourage you to take your nausea medication as prescribed.   If you develop nausea and vomiting that is not controlled by your nausea medication, call the clinic.   BELOW ARE SYMPTOMS THAT SHOULD BE REPORTED IMMEDIATELY:  *FEVER GREATER THAN 100.5 F  *CHILLS WITH OR WITHOUT FEVER  NAUSEA AND VOMITING THAT IS NOT CONTROLLED WITH YOUR NAUSEA MEDICATION  *UNUSUAL SHORTNESS OF BREATH  *UNUSUAL BRUISING OR BLEEDING  TENDERNESS IN MOUTH AND THROAT WITH OR WITHOUT PRESENCE OF ULCERS  *URINARY PROBLEMS  *BOWEL PROBLEMS  UNUSUAL RASH Items with * indicate a potential emergency and should be followed up as soon as possible.  Feel free to call the clinic should you have any questions or concerns. The clinic phone number is [phone].  Please show the [facility] at check-in to the Emergency Department and triage nurse.

## 2018-03-24 NOTE — Progress Notes (Signed)
Patient Care Team: Deland Pretty, MD as PCP - General (Internal Medicine) Baxley, Cresenciano Lick, MD (Internal Medicine)  DIAGNOSIS:    ICD-10-CM   1. Malignant neoplasm of upper-outer quadrant of right breast in female, estrogen receptor negative ([place]) C50.411    Z17.1     SUMMARY OF ONCOLOGIC HISTORY:   Malignant neoplasm of upper-outer quadrant of right breast in female, estrogen receptor negative ([place])   07/09/2017 Initial Diagnosis    Patient palpated a right breast mass which was evaluated by mammogram and ultrasound and a breast MRI which revealed 4.5 x 4 cm necrotic tumor with suspicious multiple right axillary lymph nodes; biopsy revealed IDC grade 3 ER 0%, PR 0%, HER-2 positive ratio 3.08, Ki-67 70%, T2NX stage IIa/IIb    07/18/2017 - 11/04/2017 Neo-Adjuvant Chemotherapy    TCH Perjeta x6 cycles followed by Herceptin Perjeta maintenance    08/15/2017 Genetic Testing    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  The Multi-Gene Panel offered by [facility] includes sequencing and/or deletion duplication testing of the following 83 genes: ALK, APC, ATM, AXIN2,BAP1,  BARD1, BLM, BMPR1A, BRCA1, BRCA2, BRIP1, CASR, CDC73, CDH1, CDK4, CDKN1B, CDKN1C, CDKN2A (p14ARF), CDKN2A (p16INK4a), CEBPA, CHEK2, CTNNA1, DICER1, DIS3L2, EGFR (c.2369C>T, p.Thr790Met variant only), EPCAM (Deletion/duplication testing only), FH, FLCN, GATA2, GPC3, GREM1 (Promoter region deletion/duplication testing only), HOXB13 (c.251G>A, p.Gly84Glu), HRAS, KIT, MAX, MEN1, MET, MITF (c.952G>A, p.Glu318Lys variant only), MLH1, MSH2, MSH3, MSH6, MUTYH, NBN, NF1, NF2, NTHL1, PALB2, PDGFRA, PHOX2B, PMS2, POLD1, POLE, POT1, PRKAR1A, PTCH1, PTEN, RAD50, RAD51C, RAD51D, RB1, RECQL4, RET, RUNX1, SDHAF2, SDHA (sequence changes only), SDHB, SDHC, SDHD, SMAD4, SMARCA4, SMARCB1, SMARCE1, STK11, SUFU, TERT, TERT, TMEM127, TP53, TSC1, TSC2, VHL, WRN and WT1.  The report date is August 15, 2017.    11/18/2017 Surgery   Bilateral mastectomies: Right mastectomy: IDC grade 3 3.8 cm margins negative, 0/4 lymph nodes negative, ER 0%, PR 0%, HER-2 negative ratio 1.3, IHC HER-2 negative; Ki-67 70%, RCB class II; left mastectomy: [facility]    12/18/2017 - 01/27/2018 Chemotherapy    Adjuvant chemotherapy with dose dense Adriamycin and Cytoxan x4    02/17/2018 -  Chemotherapy    Maintenance Herceptin and Perjeta    02/24/2018 Surgery    REMOVAL OF BILATERAL TISSUE EXPANDERS WITH PLACEMENT OF BILATERAL BREAST IMPLANTS and LIPOFILLING FROM ABDOMEN TO BILATERAL CHEST by Dr. Iran Planas      CHIEF COMPLIANT: Follow up for maintenance Herceptin  INTERVAL HISTORY: Rebecca [name] is a 47 y.o. with above-mentioned history of HER-2 positive breast cancer who is currently receiving Herceptin maintenance. She underwent breast reconstruction surgery with Dr. Iran Planas on 02/24/18. She presents to the clinic today alone for cycle 2. She notes Herceptin is going okay, and reports 2 episodes of diarrhea per day for which she takes Lomotil. She has lost 7 lbs in past 3 weeks. Her most recent labs show Hg 11.2, WBC 4.5, platelets 269, neutrophils 68%. She reports a fingernail that has fallen off.   REVIEW OF SYSTEMS:   Constitutional: Denies fevers, chills (+) weight loss, 7lbs in 3 weeks Eyes: Denies blurriness of vision Ears, nose, mouth, throat, and face: Denies mucositis or sore throat Respiratory: Denies cough, dyspnea or wheezes Cardiovascular: Denies palpitation, chest discomfort Gastrointestinal:  Denies nausea, heartburn or change in bowel habits (+) persistent diarrhea Skin: Denies abnormal skin rashes Lymphatics: Denies new lymphadenopathy or easy bruising Neurological:Denies numbness, tingling or new weaknesses Behavioral/Psych: Mood is stable, no new changes  Extremities: No lower extremity edema Breast: denies  any pain or lumps or nodules in either breasts All other systems were reviewed with the patient and are  negative.  I have reviewed the past medical history, past surgical history, social history and family history with the patient and they are unchanged from previous note.  ALLERGIES:  has No Known Allergies.  MEDICATIONS:  Current Outpatient Medications  Medication Sig Dispense Refill  . carvedilol (COREG) 3.125 MG tablet Take 1 tablet (3.125 mg total) by mouth 2 (two) times daily. 60 tablet 3  . diphenoxylate-atropine (LOMOTIL) 2.5-0.025 MG tablet Take 1 tablet by mouth 4 (four) times daily as needed for diarrhea or loose stools. 30 tablet 2  . esomeprazole (NEXIUM) 40 MG capsule Take 1 capsule (40 mg total) by mouth daily at 12 noon. 30 capsule 3  . levothyroxine (SYNTHROID, LEVOTHROID) 137 MCG tablet Take 137 mcg by mouth daily before breakfast.    . LORazepam (ATIVAN) 1 MG tablet Take 1 tablet (1 mg total) by mouth every 8 (eight) hours as needed (nausea and vomiting). 30 tablet 0  . losartan (COZAAR) 25 MG tablet TAKE 1 TABLET BY MOUTH EVERY DAY 30 tablet 5  . metFORMIN (GLUCOPHAGE) 500 MG tablet Take by mouth daily with breakfast.    . triamterene-hydrochlorothiazide (MAXZIDE) 75-50 MG tablet One po daily prn lower extremity edema 30 tablet 2   No current facility-administered medications for this visit.    Facility-Administered Medications Ordered in Other Visits  Medication Dose Route Frequency Provider Last Rate Last Dose  . LORazepam (ATIVAN) injection 1 mg  1 mg Intravenous Once Harle Stanford., PA-C        PHYSICAL EXAMINATION: ECOG PERFORMANCE STATUS: 1 - Symptomatic but completely ambulatory  Vitals:   03/31/18 0944  BP: 113/78  Pulse: 80  Resp: 18  Temp: 98 F (36.7 C)  SpO2: 97%   Filed Weights   03/31/18 0944  Weight: 228 lb 1.6 oz (103.5 kg)    GENERAL:alert, no distress and comfortable SKIN: skin color, texture, turgor are normal, no rashes or significant lesions EYES: normal, Conjunctiva are pink and non-injected, sclera clear OROPHARYNX:no exudate, no  erythema and lips, buccal mucosa, and tongue normal  NECK: supple, thyroid normal size, non-tender, without nodularity LYMPH:  no palpable lymphadenopathy in the cervical, axillary or inguinal LUNGS: clear to auscultation and percussion with normal breathing effort HEART: regular rate & rhythm and no murmurs and no lower extremity edema ABDOMEN:abdomen soft, non-tender and normal bowel sounds MUSCULOSKELETAL:no cyanosis of digits and no clubbing  NEURO: alert & oriented x 3 with fluent speech, no focal motor/sensory deficits EXTREMITIES: No lower extremity edema  LABORATORY DATA:  I have reviewed the data as listed CMP Latest Ref Rng & Units 03/31/2018 03/10/2018 02/17/2018  Glucose 70 - 99 mg/dL 106(H) 103(H) 104(H)  BUN 6 - 20 mg/dL 8 13 14   Creatinine 0.44 - 1.00 mg/dL 0.96 0.80 0.78  Sodium 135 - 145 mmol/L 142 142 140  Potassium 3.5 - 5.1 mmol/L 4.2 4.5 4.3  Chloride 98 - 111 mmol/L 107 106 104  CO2 22 - 32 mmol/L 25 27 25   Calcium 8.9 - 10.3 mg/dL 9.3 9.7 9.5  Total Protein 6.5 - 8.1 g/dL 6.8 6.7 6.9  Total Bilirubin 0.3 - 1.2 mg/dL 0.3 0.3 0.4  Alkaline Phos 38 - 126 U/L 83 71 68  AST 15 - 41 U/L 15 17 13(L)  ALT 0 - 44 U/L 18 19 16     Lab Results  Component Value Date   WBC  4.5 03/31/2018   HGB 11.2 (L) 03/31/2018   HCT 36.2 03/31/2018   MCV 87.7 03/31/2018   PLT 269 03/31/2018   NEUTROABS 3.0 03/31/2018    ASSESSMENT & PLAN:  Malignant neoplasm of upper-outer quadrant of right breast in female, estrogen receptor negative ([place]) 07/09/2017:Patient palpated a right breast mass which was evaluated by mammogram and ultrasound and a breast MRI which revealed 4.5 x 4 cm necrotic tumor with suspicious multiple right axillary lymph nodes; biopsy revealed IDC grade 3 ER 0%, PR 0%, HER-2 positive ratio 3.08, Ki-67 70%, T2NX stage IIa/IIb  Treatment planbased on multidisciplinary tumor board: 1. Neoadjuvant chemotherapy with TCH Perjeta 6 cycles followed by Herceptinand  Perjetamaintenance for 1 year 2. Followed bybilateral mastectomies with targeted node dissection on the right7/23/2019 3.Due to significant residual disease, additional adjuvant chemo with Adriamycin and Cytoxan 12/18/2017-01/27/2018 4. Followed by adjuvant radiation therapy ----------------------------------------------------------------------------- 11/18/2017:Bilateral mastectomies: Right mastectomy: IDC grade 3 3.8 cm margins negative, 0/4 lymph nodes negative, ER 0%, PR 0%, HER-2 negative ratio 1.3, IHC HER-2 negative; Ki-67 70%, RCB class II; left mastectomy: [facility]  Current treatment: Herceptin  Maintenance (Perjeta discontinued for diarrhea) Toxicities: 1.  Diarrhea: 2 episodes per day, takes Imodium and Lomotil. 2. her right finger nail fell off. 3.  Slight tingling in the right side of the face probably due to not drinking enough water.  Return to clinic every 3 weeks for Herceptin every 6 weeks for follow-up with me.    No orders of the defined types were placed in this encounter.  The patient has a good understanding of the overall plan. she agrees with it. she will call with any problems that may develop before the next visit here.  Nicholas Lose, MD 03/31/2018   I, Cloyde Reams Dorshimer, am acting as scribe for Nicholas Lose, MD.  I have reviewed the above documentation for accuracy and completeness, and I agree with the above.

## 2018-03-31 ENCOUNTER — Inpatient Hospital Stay: Payer: BLUE CROSS/BLUE SHIELD | Attending: Hematology and Oncology

## 2018-03-31 ENCOUNTER — Encounter: Payer: Self-pay | Admitting: *Deleted

## 2018-03-31 ENCOUNTER — Inpatient Hospital Stay: Payer: BLUE CROSS/BLUE SHIELD

## 2018-03-31 ENCOUNTER — Inpatient Hospital Stay (HOSPITAL_BASED_OUTPATIENT_CLINIC_OR_DEPARTMENT_OTHER): Payer: BLUE CROSS/BLUE SHIELD | Admitting: Hematology and Oncology

## 2018-03-31 DIAGNOSIS — Z171 Estrogen receptor negative status [ER-]: Secondary | ICD-10-CM | POA: Insufficient documentation

## 2018-03-31 DIAGNOSIS — Z5112 Encounter for antineoplastic immunotherapy: Secondary | ICD-10-CM | POA: Diagnosis not present

## 2018-03-31 DIAGNOSIS — C50411 Malignant neoplasm of upper-outer quadrant of right female breast: Secondary | ICD-10-CM

## 2018-03-31 DIAGNOSIS — Z79899 Other long term (current) drug therapy: Secondary | ICD-10-CM

## 2018-03-31 DIAGNOSIS — Z9013 Acquired absence of bilateral breasts and nipples: Secondary | ICD-10-CM | POA: Insufficient documentation

## 2018-03-31 DIAGNOSIS — Z9221 Personal history of antineoplastic chemotherapy: Secondary | ICD-10-CM

## 2018-03-31 DIAGNOSIS — Z9882 Breast implant status: Secondary | ICD-10-CM | POA: Diagnosis not present

## 2018-03-31 DIAGNOSIS — Z95828 Presence of other vascular implants and grafts: Secondary | ICD-10-CM

## 2018-03-31 LAB — CBC WITH DIFFERENTIAL (CANCER CENTER ONLY)
Abs Immature Granulocytes: 0.01 10*3/uL (ref 0.00–0.07)
BASOS ABS: 0 10*3/uL (ref 0.0–0.1)
Basophils Relative: 0 %
EOS PCT: 2 %
Eosinophils Absolute: 0.1 10*3/uL (ref 0.0–0.5)
HEMATOCRIT: 36.2 % (ref 36.0–46.0)
HEMOGLOBIN: 11.2 g/dL — AB (ref 12.0–15.0)
IMMATURE GRANULOCYTES: 0 %
LYMPHS ABS: 1 10*3/uL (ref 0.7–4.0)
LYMPHS PCT: 22 %
MCH: 27.1 pg (ref 26.0–34.0)
MCHC: 30.9 g/dL (ref 30.0–36.0)
MCV: 87.7 fL (ref 80.0–100.0)
Monocytes Absolute: 0.4 10*3/uL (ref 0.1–1.0)
Monocytes Relative: 8 %
NEUTROS PCT: 68 %
NRBC: 0 % (ref 0.0–0.2)
Neutro Abs: 3 10*3/uL (ref 1.7–7.7)
Platelet Count: 269 10*3/uL (ref 150–400)
RBC: 4.13 MIL/uL (ref 3.87–5.11)
RDW: 15.5 % (ref 11.5–15.5)
WBC Count: 4.5 10*3/uL (ref 4.0–10.5)

## 2018-03-31 LAB — CMP (CANCER CENTER ONLY)
ALBUMIN: 3.8 g/dL (ref 3.5–5.0)
ALT: 18 U/L (ref 0–44)
AST: 15 U/L (ref 15–41)
Alkaline Phosphatase: 83 U/L (ref 38–126)
Anion gap: 10 (ref 5–15)
BUN: 8 mg/dL (ref 6–20)
CHLORIDE: 107 mmol/L (ref 98–111)
CO2: 25 mmol/L (ref 22–32)
CREATININE: 0.96 mg/dL (ref 0.44–1.00)
Calcium: 9.3 mg/dL (ref 8.9–10.3)
GFR, Estimated: >60 mL/min (ref >60)
Glucose, Bld: 106 mg/dL — ABNORMAL HIGH (ref 70–99)
Potassium: 4.2 mmol/L (ref 3.5–5.1)
Sodium: 142 mmol/L (ref 135–145)
Total Bilirubin: 0.3 mg/dL (ref 0.3–1.2)
Total Protein: 6.8 g/dL (ref 6.5–8.1)

## 2018-03-31 MED ORDER — SODIUM CHLORIDE 0.9% FLUSH
10.0000 mL | INTRAVENOUS | Status: DC | PRN
  Administered 2018-03-31: 10 mL
  Filled 2018-03-31: qty 10

## 2018-03-31 MED ORDER — DIPHENHYDRAMINE HCL 25 MG PO CAPS
ORAL_CAPSULE | ORAL | Status: AC
  Filled 2018-03-31: qty 2

## 2018-03-31 MED ORDER — SODIUM CHLORIDE 0.9 % IV SOLN
Freq: Once | INTRAVENOUS | Status: AC
  Administered 2018-03-31: 11:00:00 via INTRAVENOUS
  Filled 2018-03-31: qty 250

## 2018-03-31 MED ORDER — DIPHENHYDRAMINE HCL 25 MG PO CAPS
50.0000 mg | ORAL_CAPSULE | Freq: Once | ORAL | Status: AC
  Administered 2018-03-31: 50 mg via ORAL

## 2018-03-31 MED ORDER — ACETAMINOPHEN 325 MG PO TABS
ORAL_TABLET | ORAL | Status: AC
  Filled 2018-03-31: qty 2

## 2018-03-31 MED ORDER — HEPARIN SOD (PORK) LOCK FLUSH 100 UNIT/ML IV SOLN
500.0000 [IU] | Freq: Once | INTRAVENOUS | Status: AC | PRN
  Administered 2018-03-31: 500 [IU]
  Filled 2018-03-31: qty 5

## 2018-03-31 MED ORDER — DIPHENOXYLATE-ATROPINE 2.5-0.025 MG PO TABS: 1.0000 | ORAL_TABLET | Freq: Four times a day (QID) | ORAL | 2 refills | Status: DC | PRN

## 2018-03-31 MED ORDER — TRASTUZUMAB CHEMO 150 MG IV SOLR
600.0000 mg | Freq: Once | INTRAVENOUS | Status: AC
  Administered 2018-03-31: 600 mg via INTRAVENOUS
  Filled 2018-03-31: qty 28.57

## 2018-03-31 MED ORDER — ACETAMINOPHEN 325 MG PO TABS
650.0000 mg | ORAL_TABLET | Freq: Once | ORAL | Status: AC
  Administered 2018-03-31: 650 mg via ORAL

## 2018-03-31 NOTE — Patient Instructions (Signed)
[facility] Discharge Instructions for Patients Receiving Chemotherapy  Today you received the following chemotherapy agents Herceptin  To help prevent nausea and vomiting after your treatment, we encourage you to take your nausea medication as directed   If you develop nausea and vomiting that is not controlled by your nausea medication, call the clinic.   BELOW ARE SYMPTOMS THAT SHOULD BE REPORTED IMMEDIATELY:  *FEVER GREATER THAN 100.5 F  *CHILLS WITH OR WITHOUT FEVER  NAUSEA AND VOMITING THAT IS NOT CONTROLLED WITH YOUR NAUSEA MEDICATION  *UNUSUAL SHORTNESS OF BREATH  *UNUSUAL BRUISING OR BLEEDING  TENDERNESS IN MOUTH AND THROAT WITH OR WITHOUT PRESENCE OF ULCERS  *URINARY PROBLEMS  *BOWEL PROBLEMS  UNUSUAL RASH Items with * indicate a potential emergency and should be followed up as soon as possible.  Feel free to call the clinic should you have any questions or concerns. The clinic phone number is [phone].  Please show the CHEMO ALERT CARD at check-in to the Emergency Department and triage nurse.   

## 2018-03-31 NOTE — Assessment & Plan Note (Addendum)
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 Neoadjuvant chemotherapy with TCH Perjeta 6 cycles followed by Herceptinand Perjetamaintenance for 1 year 2. Followed bybilateral mastectomies with targeted node dissection on the right7/23/2019 3.Due to significant residual disease, additional adjuvant chemo with Adriamycin and Cytoxan 12/18/2017-01/27/2018 4. Followed by adjuvant radiation therapy ----------------------------------------------------------------------------- 11/18/2017:Bilateral mastectomies: Right mastectomy: IDC grade 3 3.8 cm margins negative, 0/4 lymph nodes negative, ER 0%, PR 0%, HER-2 negative ratio 1.3, IHC HER-2 negative; Ki-67 70%, RCB class II; left mastectomy: [facility]  Current treatment: Herceptin  Maintenance (Perjeta discontinued for diarrhea) Toxicities: 1.  Diarrhea: 2 episodes per day, takes Imodium and Lomotil. 2. her right finger nail fell off. 3.  Slight tingling in the right side of the face probably due to not drinking enough water.  Return to clinic every 3 weeks for Herceptin every 6 weeks for follow-up with me.

## 2018-04-01 ENCOUNTER — Telehealth: Payer: Self-pay | Admitting: Hematology and Oncology

## 2018-04-01 NOTE — Telephone Encounter (Signed)
Removed lab and port flush on 12/26 per 12/3 los.  Patient my chart active.

## 2018-04-21 ENCOUNTER — Other Ambulatory Visit: Payer: BLUE CROSS/BLUE SHIELD

## 2018-04-21 ENCOUNTER — Ambulatory Visit: Payer: BLUE CROSS/BLUE SHIELD

## 2018-04-23 ENCOUNTER — Inpatient Hospital Stay: Payer: BLUE CROSS/BLUE SHIELD

## 2018-04-23 ENCOUNTER — Other Ambulatory Visit: Payer: BLUE CROSS/BLUE SHIELD

## 2018-04-23 ENCOUNTER — Other Ambulatory Visit: Payer: Self-pay

## 2018-04-23 VITALS — BP 133/84 | HR 89 | Temp 98.3°F | Resp 18

## 2018-04-23 DIAGNOSIS — C50411 Malignant neoplasm of upper-outer quadrant of right female breast: Secondary | ICD-10-CM

## 2018-04-23 DIAGNOSIS — Z171 Estrogen receptor negative status [ER-]: Principal | ICD-10-CM

## 2018-04-23 MED ORDER — SODIUM CHLORIDE 0.9 % IV SOLN
Freq: Once | INTRAVENOUS | Status: AC
  Administered 2018-04-23: 15:00:00 via INTRAVENOUS
  Filled 2018-04-23: qty 250

## 2018-04-23 MED ORDER — ACETAMINOPHEN 325 MG PO TABS
ORAL_TABLET | ORAL | Status: AC
  Filled 2018-04-23: qty 2

## 2018-04-23 MED ORDER — LORAZEPAM 1 MG PO TABS: 1.0000 mg | ORAL_TABLET | Freq: Three times a day (TID) | ORAL | 0 refills | Status: DC | PRN

## 2018-04-23 MED ORDER — DIPHENHYDRAMINE HCL 25 MG PO CAPS
ORAL_CAPSULE | ORAL | Status: AC
  Filled 2018-04-23: qty 2

## 2018-04-23 MED ORDER — TRIAMTERENE-HCTZ 75-50 MG PO TABS: ORAL_TABLET | ORAL | 0 refills | Status: DC

## 2018-04-23 MED ORDER — DIPHENHYDRAMINE HCL 25 MG PO CAPS
50.0000 mg | ORAL_CAPSULE | Freq: Once | ORAL | Status: AC
  Administered 2018-04-23: 50 mg via ORAL

## 2018-04-23 MED ORDER — HEPARIN SOD (PORK) LOCK FLUSH 100 UNIT/ML IV SOLN
500.0000 [IU] | Freq: Once | INTRAVENOUS | Status: AC
  Administered 2018-04-23: 500 [IU] via INTRAVENOUS
  Filled 2018-04-23: qty 5

## 2018-04-23 MED ORDER — SODIUM CHLORIDE 0.9% FLUSH
10.0000 mL | Freq: Once | INTRAVENOUS | Status: AC
  Administered 2018-04-23: 10 mL
  Filled 2018-04-23: qty 10

## 2018-04-23 MED ORDER — TRASTUZUMAB CHEMO 150 MG IV SOLR
600.0000 mg | Freq: Once | INTRAVENOUS | Status: AC
  Administered 2018-04-23: 600 mg via INTRAVENOUS
  Filled 2018-04-23: qty 28.57

## 2018-04-23 MED ORDER — ACETAMINOPHEN 325 MG PO TABS
650.0000 mg | ORAL_TABLET | Freq: Once | ORAL | Status: AC
  Administered 2018-04-23: 650 mg via ORAL

## 2018-04-23 NOTE — Telephone Encounter (Signed)
Dr. Lindi Adie verbalized OK to refill HCTZ for patient.

## 2018-05-12 ENCOUNTER — Inpatient Hospital Stay: Payer: BLUE CROSS/BLUE SHIELD

## 2018-05-12 ENCOUNTER — Inpatient Hospital Stay: Payer: BLUE CROSS/BLUE SHIELD | Attending: Hematology and Oncology

## 2018-05-12 VITALS — BP 146/94 | HR 55 | Temp 97.5°F | Resp 18

## 2018-05-12 DIAGNOSIS — Z95828 Presence of other vascular implants and grafts: Secondary | ICD-10-CM

## 2018-05-12 DIAGNOSIS — C50411 Malignant neoplasm of upper-outer quadrant of right female breast: Secondary | ICD-10-CM | POA: Insufficient documentation

## 2018-05-12 DIAGNOSIS — Z5112 Encounter for antineoplastic immunotherapy: Secondary | ICD-10-CM | POA: Insufficient documentation

## 2018-05-12 DIAGNOSIS — Z171 Estrogen receptor negative status [ER-]: Secondary | ICD-10-CM | POA: Diagnosis not present

## 2018-05-12 LAB — CMP (CANCER CENTER ONLY)
ALT: 16 U/L (ref 0–44)
AST: 13 U/L — ABNORMAL LOW (ref 15–41)
Albumin: 3.8 g/dL (ref 3.5–5.0)
Alkaline Phosphatase: 74 U/L (ref 38–126)
Anion gap: 7 (ref 5–15)
BILIRUBIN TOTAL: 0.2 mg/dL — AB (ref 0.3–1.2)
BUN: 16 mg/dL (ref 6–20)
CO2: 27 mmol/L (ref 22–32)
Calcium: 9.2 mg/dL (ref 8.9–10.3)
Chloride: 107 mmol/L (ref 98–111)
Creatinine: 0.83 mg/dL (ref 0.44–1.00)
GFR, Est AFR Am: >60 mL/min (ref >60)
GFR, Estimated: >60 mL/min (ref >60)
Glucose, Bld: 90 mg/dL (ref 70–99)
Potassium: 4.4 mmol/L (ref 3.5–5.1)
Sodium: 141 mmol/L (ref 135–145)
Total Protein: 6.9 g/dL (ref 6.5–8.1)

## 2018-05-12 LAB — CBC WITH DIFFERENTIAL (CANCER CENTER ONLY)
Abs Immature Granulocytes: 0.01 10*3/uL (ref 0.00–0.07)
Basophils Absolute: 0 10*3/uL (ref 0.0–0.1)
Basophils Relative: 1 %
Eosinophils Absolute: 0.1 10*3/uL (ref 0.0–0.5)
Eosinophils Relative: 2 %
HCT: 37.1 % (ref 36.0–46.0)
Hemoglobin: 11.4 g/dL — ABNORMAL LOW (ref 12.0–15.0)
Immature Granulocytes: 0 %
Lymphocytes Relative: 25 %
Lymphs Abs: 1.1 10*3/uL (ref 0.7–4.0)
MCH: 25.4 pg — ABNORMAL LOW (ref 26.0–34.0)
MCHC: 30.7 g/dL (ref 30.0–36.0)
MCV: 82.8 fL (ref 80.0–100.0)
Monocytes Absolute: 0.4 10*3/uL (ref 0.1–1.0)
Monocytes Relative: 9 %
NEUTROS ABS: 2.8 10*3/uL (ref 1.7–7.7)
Neutrophils Relative %: 63 %
Platelet Count: 227 10*3/uL (ref 150–400)
RBC: 4.48 MIL/uL (ref 3.87–5.11)
RDW: 14.4 % (ref 11.5–15.5)
WBC Count: 4.4 10*3/uL (ref 4.0–10.5)
nRBC: 0 % (ref 0.0–0.2)

## 2018-05-12 MED ORDER — DIPHENHYDRAMINE HCL 25 MG PO CAPS
ORAL_CAPSULE | ORAL | Status: AC
  Filled 2018-05-12: qty 1

## 2018-05-12 MED ORDER — SODIUM CHLORIDE 0.9 % IV SOLN
Freq: Once | INTRAVENOUS | Status: AC
  Administered 2018-05-12: 11:00:00 via INTRAVENOUS
  Filled 2018-05-12: qty 250

## 2018-05-12 MED ORDER — SODIUM CHLORIDE 0.9% FLUSH
10.0000 mL | INTRAVENOUS | Status: DC | PRN
  Administered 2018-05-12: 10 mL
  Filled 2018-05-12: qty 10

## 2018-05-12 MED ORDER — ACETAMINOPHEN 325 MG PO TABS
650.0000 mg | ORAL_TABLET | Freq: Once | ORAL | Status: AC
  Administered 2018-05-12: 650 mg via ORAL

## 2018-05-12 MED ORDER — HEPARIN SOD (PORK) LOCK FLUSH 100 UNIT/ML IV SOLN
500.0000 [IU] | Freq: Once | INTRAVENOUS | Status: AC | PRN
  Administered 2018-05-12: 500 [IU]
  Filled 2018-05-12: qty 5

## 2018-05-12 MED ORDER — DIPHENHYDRAMINE HCL 25 MG PO CAPS
50.0000 mg | ORAL_CAPSULE | Freq: Once | ORAL | Status: AC
  Administered 2018-05-12: 50 mg via ORAL

## 2018-05-12 MED ORDER — TRASTUZUMAB CHEMO 150 MG IV SOLR
600.0000 mg | Freq: Once | INTRAVENOUS | Status: AC
  Administered 2018-05-12: 600 mg via INTRAVENOUS
  Filled 2018-05-12: qty 28.57

## 2018-05-12 MED ORDER — ACETAMINOPHEN 325 MG PO TABS
ORAL_TABLET | ORAL | Status: AC
  Filled 2018-05-12: qty 2

## 2018-05-12 NOTE — Patient Instructions (Signed)
[facility] Discharge Instructions for Patients Receiving Chemotherapy  Today you received the following chemotherapy agents Herceptin  To help prevent nausea and vomiting after your treatment, we encourage you to take your nausea medication as directed   If you develop nausea and vomiting that is not controlled by your nausea medication, call the clinic.   BELOW ARE SYMPTOMS THAT SHOULD BE REPORTED IMMEDIATELY:  *FEVER GREATER THAN 100.5 F  *CHILLS WITH OR WITHOUT FEVER  NAUSEA AND VOMITING THAT IS NOT CONTROLLED WITH YOUR NAUSEA MEDICATION  *UNUSUAL SHORTNESS OF BREATH  *UNUSUAL BRUISING OR BLEEDING  TENDERNESS IN MOUTH AND THROAT WITH OR WITHOUT PRESENCE OF ULCERS  *URINARY PROBLEMS  *BOWEL PROBLEMS  UNUSUAL RASH Items with * indicate a potential emergency and should be followed up as soon as possible.  Feel free to call the clinic should you have any questions or concerns. The clinic phone number is [phone].  Please show the CHEMO ALERT CARD at check-in to the Emergency Department and triage nurse.   

## 2018-05-15 ENCOUNTER — Telehealth: Payer: Self-pay | Admitting: Hematology and Oncology

## 2018-05-15 ENCOUNTER — Ambulatory Visit (HOSPITAL_BASED_OUTPATIENT_CLINIC_OR_DEPARTMENT_OTHER)
Admission: RE | Admit: 2018-05-15 | Discharge: 2018-05-15 | Disposition: A | Discharge status: Home / Self Care | Payer: BLUE CROSS/BLUE SHIELD | Source: Ambulatory Visit

## 2018-05-15 ENCOUNTER — Other Ambulatory Visit (HOSPITAL_COMMUNITY): Payer: BLUE CROSS/BLUE SHIELD

## 2018-05-15 ENCOUNTER — Ambulatory Visit (HOSPITAL_COMMUNITY)
Admission: RE | Admit: 2018-05-15 | Discharge: 2018-05-15 | Disposition: A | Discharge status: Home / Self Care | Payer: BLUE CROSS/BLUE SHIELD | Source: Ambulatory Visit | Attending: Internal Medicine | Admitting: Internal Medicine

## 2018-05-15 ENCOUNTER — Encounter (HOSPITAL_COMMUNITY): Payer: Self-pay | Admitting: Internal Medicine

## 2018-05-15 VITALS — BP 120/82 | HR 86 | Wt 226.4 lb

## 2018-05-15 DIAGNOSIS — I358 Other nonrheumatic aortic valve disorders: Secondary | ICD-10-CM | POA: Diagnosis not present

## 2018-05-15 DIAGNOSIS — C50011 Malignant neoplasm of nipple and areola, right female breast: Secondary | ICD-10-CM

## 2018-05-15 DIAGNOSIS — Z7984 Long term (current) use of oral hypoglycemic drugs: Secondary | ICD-10-CM | POA: Insufficient documentation

## 2018-05-15 DIAGNOSIS — R002 Palpitations: Secondary | ICD-10-CM | POA: Insufficient documentation

## 2018-05-15 DIAGNOSIS — E039 Hypothyroidism, unspecified: Secondary | ICD-10-CM | POA: Diagnosis not present

## 2018-05-15 DIAGNOSIS — K219 Gastro-esophageal reflux disease without esophagitis: Secondary | ICD-10-CM | POA: Insufficient documentation

## 2018-05-15 DIAGNOSIS — Z79890 Hormone replacement therapy (postmenopausal): Secondary | ICD-10-CM | POA: Insufficient documentation

## 2018-05-15 DIAGNOSIS — R0789 Other chest pain: Secondary | ICD-10-CM | POA: Insufficient documentation

## 2018-05-15 DIAGNOSIS — I10 Essential (primary) hypertension: Secondary | ICD-10-CM | POA: Insufficient documentation

## 2018-05-15 DIAGNOSIS — Z79899 Other long term (current) drug therapy: Secondary | ICD-10-CM | POA: Diagnosis not present

## 2018-05-15 DIAGNOSIS — C50411 Malignant neoplasm of upper-outer quadrant of right female breast: Secondary | ICD-10-CM | POA: Diagnosis not present

## 2018-05-15 DIAGNOSIS — I2510 Atherosclerotic heart disease of native coronary artery without angina pectoris: Secondary | ICD-10-CM | POA: Diagnosis not present

## 2018-05-15 DIAGNOSIS — Z171 Estrogen receptor negative status [ER-]: Secondary | ICD-10-CM | POA: Diagnosis not present

## 2018-05-15 DIAGNOSIS — Z807 Family history of other malignant neoplasms of lymphoid, hematopoietic and related tissues: Secondary | ICD-10-CM | POA: Diagnosis not present

## 2018-05-15 DIAGNOSIS — I447 Left bundle-branch block, unspecified: Secondary | ICD-10-CM | POA: Diagnosis not present

## 2018-05-15 DIAGNOSIS — D509 Iron deficiency anemia, unspecified: Secondary | ICD-10-CM | POA: Insufficient documentation

## 2018-05-15 DIAGNOSIS — Z833 Family history of diabetes mellitus: Secondary | ICD-10-CM | POA: Diagnosis not present

## 2018-05-15 DIAGNOSIS — E119 Type 2 diabetes mellitus without complications: Secondary | ICD-10-CM | POA: Diagnosis not present

## 2018-05-15 DIAGNOSIS — Z87891 Personal history of nicotine dependence: Secondary | ICD-10-CM | POA: Diagnosis not present

## 2018-05-15 DIAGNOSIS — I2584 Coronary atherosclerosis due to calcified coronary lesion: Secondary | ICD-10-CM

## 2018-05-15 MED ORDER — ROSUVASTATIN CALCIUM 20 MG PO TABS: 20.0000 mg | ORAL_TABLET | Freq: Every day | ORAL | 3 refills | Status: DC

## 2018-05-15 NOTE — Patient Instructions (Addendum)
START Crestor 20 mg daily  Your physician recommends that you schedule a follow-up appointment in: 3 months with Dr. Haroldine Laws and an ECHO  .

## 2018-05-15 NOTE — Progress Notes (Signed)
  Echocardiogram 2D Echocardiogram has been performed.  Koki Buxton L Androw 05/15/2018, 9:32 AM

## 2018-05-15 NOTE — Addendum Note (Signed)
Encounter addended by: Valeda Malm, RN on: 05/15/2018 10:40 AM  Actions taken: Clinical Note Signed, Visit diagnoses modified, Order list changed, Diagnosis association updated

## 2018-05-15 NOTE — Progress Notes (Signed)
Cardio-Oncology Clinic Note   Referring Physician: Dr Lindi Adie Primary Care: Dr Shelia Media Primary Cardiologist: Dr Haroldine Laws  HPI:  Rebecca [name] is a 48 y.o. female with past medical history of LBBB, DM, anxiety, GERD, diverticulosis, hypothyroidism, iron deficiency anemia, and right breast cancer who is referred to Dr. Lindi Adie to establish in the cardio-oncology clinic for monitoring of cardio-toxicty while undergoing chemotherapy.  Breast cancer history:  Malignant neoplasm of upper-outer quadrant of right breast in female, estrogen receptor negative ([place])   07/09/2017 Initial Diagnosis    Patient palpated a right breast mass which was evaluated by mammogram and ultrasound and a breast MRI which revealed 4.5 x 4 cm necrotic tumor with suspicious multiple right axillary lymph nodes; biopsy revealed IDC grade 3 ER 0%, PR 0%, HER-2 positive ratio 3.08, Ki-67 70%, T2NX stage IIa/IIb      07/18/2017 -  Neo-Adjuvant Chemotherapy    TCH Perjeta x6 cycles followed by Herceptin Perjeta maintenance      08/15/2017 Genetic Testing    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  The Multi-Gene Panel offered by [facility] includes sequencing and/or deletion duplication testing of the following 83 genes: ALK, APC, ATM, AXIN2,BAP1,  BARD1, BLM, BMPR1A, BRCA1, BRCA2, BRIP1, CASR, CDC73, CDH1, CDK4, CDKN1B, CDKN1C, CDKN2A (p14ARF), CDKN2A (p16INK4a), CEBPA, CHEK2, CTNNA1, DICER1, DIS3L2, EGFR (c.2369C>T, p.Thr790Met variant only), EPCAM (Deletion/duplication testing only), FH, FLCN, GATA2, GPC3, GREM1 (Promoter region deletion/duplication testing only), HOXB13 (c.251G>A, p.Gly84Glu), HRAS, KIT, MAX, MEN1, MET, MITF (c.952G>A, p.Glu318Lys variant only), MLH1, MSH2, MSH3, MSH6, MUTYH, NBN, NF1, NF2, NTHL1, PALB2, PDGFRA, PHOX2B, PMS2, POLD1, POLE, POT1, PRKAR1A, PTCH1, PTEN, RAD50, RAD51C, RAD51D, RB1, RECQL4, RET, RUNX1, SDHAF2, SDHA (sequence changes only), SDHB, SDHC,  SDHD, SMAD4, SMARCA4, SMARCB1, SMARCE1, STK11, SUFU, TERT, TERT, TMEM127, TP53, TSC1, TSC2, VHL, WRN and WT1.  The report date is August 15, 2017.      Treatment Plan based on multidisciplinary tumor board: 1. Neoadjuvant chemotherapy with TCH Perjeta 6 cycles followed by Herceptinand Perjetamaintenance for 1 year 2. Followed bybilateral mastectomies with targeted node dissection on the right 3. Followed by adjuvant radiation therapy  Echo 07/17/17: EF 50-55%, borderline diffuse HK, septal motion paradox, trivial AI, LA mildly dilated Echo 10/16/17: EF 45-50%, grade 1 DD, septal motion dyssynergy, trivial MR   She has a family history of CAD but denies any known personal h/o CAD. Has been following with Dr. Einar Gip for LBBB.  She started treatment for her breast CA in March 2019 with [facility] every 3 weeks.  I saw her as a consult in 8/19. EF ~50%. We discussed the fact that her EF is slightly decreased but this is due to septal dyssynergy in setting of LBBB and was completely unchanged from pre-chemo baseline. I reassured her that she does not have any cardiotoxicity from Herceptin at this point. I also emphasized the importance of sticking with her Herceptin therapy to help cure her breast CA and that if she were to get some cardiotoxicity this would be completely reversible with a scheduled treatment interruption. We ordered cMRI to reassess but it did not get done.   Calcium score 2/18 56 (99%)   Here for f/u. Tolerating Herceptin well. Will finish in July. Denies CP or SOB. But husband says she complains of CP. Feels HR fluttering at times.  No edema, orthopnea or PND.   Echo today 05/15/18 EF 50-5% in setting of LBBB. GLS -18.6%   Family HX:   MGF 22s died MI M 66 nash F  67s lymphoma MGM 60 CABG died 73s CVA   Past Medical History:  Diagnosis Date  . Anxiety   . Breast cancer ([place])   . Diabetes mellitus without complication ([place])   . Diverticulitis   . GERD (gastroesophageal  reflux disease)   . Hiatal hernia   . Hypertension   . Hypothyroidism   . LBBB (left bundle branch block)     Current Outpatient Medications  Medication Sig Dispense Refill  . carvedilol (COREG) 3.125 MG tablet Take 1 tablet (3.125 mg total) by mouth 2 (two) times daily. 60 tablet 3  . diphenoxylate-atropine (LOMOTIL) 2.5-0.025 MG tablet Take 1 tablet by mouth 4 (four) times daily as needed for diarrhea or loose stools. 30 tablet 2  . esomeprazole (NEXIUM) 40 MG capsule Take 1 capsule (40 mg total) by mouth daily at 12 noon. 30 capsule 3  . levothyroxine (SYNTHROID, LEVOTHROID) 137 MCG tablet Take 137 mcg by mouth daily before breakfast.    . LORazepam (ATIVAN) 1 MG tablet Take 1 tablet (1 mg total) by mouth every 8 (eight) hours as needed (nausea and vomiting). 30 tablet 0  . losartan (COZAAR) 25 MG tablet TAKE 1 TABLET BY MOUTH EVERY DAY 30 tablet 5  . metFORMIN (GLUCOPHAGE) 500 MG tablet Take by mouth daily with breakfast.    . triamterene-hydrochlorothiazide (MAXZIDE) 75-50 MG tablet One po daily prn lower extremity edema 30 tablet 0   No current facility-administered medications for this encounter.    Facility-Administered Medications Ordered in Other Encounters  Medication Dose Route Frequency Provider Last Rate Last Dose  . LORazepam (ATIVAN) injection 1 mg  1 mg Intravenous Once Harle Stanford., PA-C        No Known Allergies    Social History   Socioeconomic History  . Marital status: Married    Spouse name: Not on file  . Number of children: 2  . Years of education: Not on file  . Highest education level: Not on file  Occupational History  . Occupation: Neurosurgeon  . Financial resource strain: Not on file  . Food insecurity:    Worry: Not on file    Inability: Not on file  . Transportation needs:    Medical: Not on file    Non-medical: Not on file  Tobacco Use  . Smoking status: Former Smoker    Packs/day: 0.25    Years: 6.00    Pack years:  1.50    Types: Cigarettes    Last attempt to quit: 03/29/2005    Years since quitting: 13.1  . Smokeless tobacco: Never Used  Substance and Sexual Activity  . Alcohol use: Yes    Comment: socially  . Drug use: No  . Sexual activity: Yes    Birth control/protection: Surgical    Comment: BTL  Lifestyle  . Physical activity:    Days per week: Not on file    Minutes per session: Not on file  . Stress: Not on file  Relationships  . Social connections:    Talks on phone: Not on file    Gets together: Not on file    Attends religious service: Not on file    Active member of club or organization: Not on file    Attends meetings of clubs or organizations: Not on file    Relationship status: Not on file  . Intimate partner violence:    Fear of current or ex partner: Not on file    Emotionally abused: Not on  file    Physically abused: Not on file    Forced sexual activity: Not on file  Other Topics Concern  . Not on file  Social History Narrative   ** Merged History Encounter **          Family History  Problem Relation Age of Onset  . Lymphoma Father   . Cancer Father        lymphoma  . Diabetes Brother   . Diabetes Mother   . Liver disease Mother        NASH, d. 77  . Stomach cancer Neg Hx   . Colon cancer Neg Hx     Vitals:   05/15/18 0945  BP: 120/82  Pulse: 86  SpO2: 98%  Weight: 102.7 kg (226 lb 6.4 oz)   Wt Readings from Last 3 Encounters:  05/15/18 102.7 kg (226 lb 6.4 oz)  03/31/18 103.5 kg (228 lb 1.6 oz)  03/10/18 106.7 kg (235 lb 3.2 oz)   PHYSICAL EXAM: General:  Well appearing. No resp difficulty HEENT: normal Neck: supple. no JVD. Carotids 2+ bilat; no bruits. No lymphadenopathy or thryomegaly appreciated. R neck port Cor: PMI nondisplaced. Regular rate & rhythm. No rubs, gallops or murmurs. Lungs: clear Abdomen: obese soft, nontender, nondistended. No hepatosplenomegaly. No bruits or masses. Good bowel sounds. Extremities: no cyanosis,  clubbing, rash, edema Neuro: alert & orientedx3, cranial nerves grossly intact. moves all 4 extremities w/o difficulty. Affect pleasant    ASSESSMENT & PLAN:  1. Malignant neoplasm of upper-outer quadrant of right breast in female, estrogen receptor negative ([place]) Neoadjuvant chemotherapy with [place] Perjeta 6 cycles followed by Herceptinand Perjetamaintenance for 1 year - Echo 07/17/17: EF 50-55%, borderline diffuse HK, septal motion paradox, trivial AI, LA mildly dilated - Echo 10/16/17: EF 45-50%, grade 1 DD, septal motion dyssynergy, trivial MR - Echo 10/19. EF 50-55% with LBBB. - Echo 1/20 EF 50-55% with LBBB GLS -18.6% - I reviewed echos personally. EF and Doppler parameters stable. No HF on exam. Continue Herceptin.  - Continue carvedilol and losartan for now until she finishes Herceptin   2. Chest pressure - Typical and atypical features. Strong family h/o premature CAD. She also has DM - Calcium score 56 (2/18) - Given symptoms and risk factors with low normal EF and LBBB we previously discussed CTA coronary but she would like to hold off. Will start crestor 20   3. LBBB - since EKG 2013  4. Iron deficiency anemia - Received IV iron 5/6, 5/28  5. Palpitations - if persist will get Zio patch  Glori Bickers, MD  10:15 AM

## 2018-05-15 NOTE — Telephone Encounter (Signed)
Scheduled appt per 1/14 sch message - pt to get an updated schedule when they come in 2/4

## 2018-05-19 ENCOUNTER — Ambulatory Visit (HOSPITAL_COMMUNITY): Payer: BLUE CROSS/BLUE SHIELD

## 2018-05-21 ENCOUNTER — Other Ambulatory Visit: Payer: Self-pay | Admitting: Hematology and Oncology

## 2018-06-02 ENCOUNTER — Inpatient Hospital Stay: Payer: BLUE CROSS/BLUE SHIELD

## 2018-06-02 ENCOUNTER — Encounter: Payer: Self-pay | Admitting: Adult Health

## 2018-06-02 ENCOUNTER — Inpatient Hospital Stay (HOSPITAL_BASED_OUTPATIENT_CLINIC_OR_DEPARTMENT_OTHER): Payer: BLUE CROSS/BLUE SHIELD | Admitting: Adult Health

## 2018-06-02 ENCOUNTER — Telehealth: Payer: Self-pay | Admitting: Adult Health

## 2018-06-02 ENCOUNTER — Inpatient Hospital Stay: Payer: BLUE CROSS/BLUE SHIELD | Attending: Hematology and Oncology

## 2018-06-02 VITALS — BP 132/90 | HR 79 | Temp 98.4°F | Resp 18 | Ht 70.0 in | Wt 231.1 lb

## 2018-06-02 DIAGNOSIS — Z9221 Personal history of antineoplastic chemotherapy: Secondary | ICD-10-CM | POA: Insufficient documentation

## 2018-06-02 DIAGNOSIS — G629 Polyneuropathy, unspecified: Secondary | ICD-10-CM | POA: Insufficient documentation

## 2018-06-02 DIAGNOSIS — E1140 Type 2 diabetes mellitus with diabetic neuropathy, unspecified: Secondary | ICD-10-CM | POA: Insufficient documentation

## 2018-06-02 DIAGNOSIS — Z79899 Other long term (current) drug therapy: Secondary | ICD-10-CM

## 2018-06-02 DIAGNOSIS — E039 Hypothyroidism, unspecified: Secondary | ICD-10-CM | POA: Insufficient documentation

## 2018-06-02 DIAGNOSIS — Z171 Estrogen receptor negative status [ER-]: Secondary | ICD-10-CM | POA: Insufficient documentation

## 2018-06-02 DIAGNOSIS — Z9882 Breast implant status: Secondary | ICD-10-CM | POA: Insufficient documentation

## 2018-06-02 DIAGNOSIS — K449 Diaphragmatic hernia without obstruction or gangrene: Secondary | ICD-10-CM | POA: Diagnosis not present

## 2018-06-02 DIAGNOSIS — E669 Obesity, unspecified: Secondary | ICD-10-CM | POA: Insufficient documentation

## 2018-06-02 DIAGNOSIS — C50411 Malignant neoplasm of upper-outer quadrant of right female breast: Secondary | ICD-10-CM

## 2018-06-02 DIAGNOSIS — Z9013 Acquired absence of bilateral breasts and nipples: Secondary | ICD-10-CM | POA: Insufficient documentation

## 2018-06-02 DIAGNOSIS — Z5112 Encounter for antineoplastic immunotherapy: Secondary | ICD-10-CM | POA: Insufficient documentation

## 2018-06-02 DIAGNOSIS — R5383 Other fatigue: Secondary | ICD-10-CM

## 2018-06-02 DIAGNOSIS — Z87891 Personal history of nicotine dependence: Secondary | ICD-10-CM | POA: Insufficient documentation

## 2018-06-02 DIAGNOSIS — I10 Essential (primary) hypertension: Secondary | ICD-10-CM | POA: Diagnosis not present

## 2018-06-02 DIAGNOSIS — K219 Gastro-esophageal reflux disease without esophagitis: Secondary | ICD-10-CM | POA: Diagnosis not present

## 2018-06-02 LAB — CMP (CANCER CENTER ONLY)
ALK PHOS: 76 U/L (ref 38–126)
ALT: 15 U/L (ref 0–44)
AST: 16 U/L (ref 15–41)
Albumin: 3.9 g/dL (ref 3.5–5.0)
Anion gap: 10 (ref 5–15)
BUN: 12 mg/dL (ref 6–20)
CO2: 27 mmol/L (ref 22–32)
Calcium: 9.7 mg/dL (ref 8.9–10.3)
Chloride: 104 mmol/L (ref 98–111)
Creatinine: 0.9 mg/dL (ref 0.44–1.00)
GFR, Est AFR Am: >60 mL/min (ref >60)
GFR, Estimated: >60 mL/min (ref >60)
Glucose, Bld: 104 mg/dL — ABNORMAL HIGH (ref 70–99)
Potassium: 4.6 mmol/L (ref 3.5–5.1)
Sodium: 141 mmol/L (ref 135–145)
Total Bilirubin: 0.5 mg/dL (ref 0.3–1.2)
Total Protein: 7 g/dL (ref 6.5–8.1)

## 2018-06-02 LAB — CBC WITH DIFFERENTIAL (CANCER CENTER ONLY)
Abs Immature Granulocytes: 0.01 10*3/uL (ref 0.00–0.07)
BASOS PCT: 1 %
Basophils Absolute: 0 10*3/uL (ref 0.0–0.1)
Eosinophils Absolute: 0.1 10*3/uL (ref 0.0–0.5)
Eosinophils Relative: 2 %
HCT: 35.6 % — ABNORMAL LOW (ref 36.0–46.0)
Hemoglobin: 11.2 g/dL — ABNORMAL LOW (ref 12.0–15.0)
Immature Granulocytes: 0 %
Lymphocytes Relative: 21 %
Lymphs Abs: 1.1 10*3/uL (ref 0.7–4.0)
MCH: 25.1 pg — AB (ref 26.0–34.0)
MCHC: 31.5 g/dL (ref 30.0–36.0)
MCV: 79.6 fL — ABNORMAL LOW (ref 80.0–100.0)
MONO ABS: 0.5 10*3/uL (ref 0.1–1.0)
Monocytes Relative: 8 %
Neutro Abs: 3.8 10*3/uL (ref 1.7–7.7)
Neutrophils Relative %: 68 %
Platelet Count: 236 10*3/uL (ref 150–400)
RBC: 4.47 MIL/uL (ref 3.87–5.11)
RDW: 14.8 % (ref 11.5–15.5)
WBC Count: 5.5 10*3/uL (ref 4.0–10.5)
nRBC: 0 % (ref 0.0–0.2)

## 2018-06-02 MED ORDER — ACETAMINOPHEN 325 MG PO TABS
650.0000 mg | ORAL_TABLET | Freq: Once | ORAL | Status: AC
  Administered 2018-06-02: 650 mg via ORAL

## 2018-06-02 MED ORDER — SODIUM CHLORIDE 0.9 % IV SOLN
Freq: Once | INTRAVENOUS | Status: AC
  Administered 2018-06-02: 10:00:00 via INTRAVENOUS
  Filled 2018-06-02: qty 250

## 2018-06-02 MED ORDER — HEPARIN SOD (PORK) LOCK FLUSH 100 UNIT/ML IV SOLN
500.0000 [IU] | Freq: Once | INTRAVENOUS | Status: AC | PRN
  Administered 2018-06-02: 500 [IU]
  Filled 2018-06-02: qty 5

## 2018-06-02 MED ORDER — DIPHENHYDRAMINE HCL 25 MG PO CAPS
50.0000 mg | ORAL_CAPSULE | Freq: Once | ORAL | Status: AC
  Administered 2018-06-02: 50 mg via ORAL

## 2018-06-02 MED ORDER — SODIUM CHLORIDE 0.9% FLUSH
10.0000 mL | INTRAVENOUS | Status: DC | PRN
  Administered 2018-06-02: 10 mL
  Filled 2018-06-02: qty 10

## 2018-06-02 MED ORDER — TRASTUZUMAB CHEMO 150 MG IV SOLR
600.0000 mg | Freq: Once | INTRAVENOUS | Status: AC
  Administered 2018-06-02: 600 mg via INTRAVENOUS
  Filled 2018-06-02: qty 28.57

## 2018-06-02 NOTE — Telephone Encounter (Signed)
Per 2/4 no los

## 2018-06-02 NOTE — Progress Notes (Signed)
[facility] Follow up:    Deland Pretty, [address] [place] [address]   DIAGNOSIS: Cancer Staging Malignant neoplasm of upper-outer quadrant of right breast in female, estrogen receptor negative ([place]) Staging form: Breast, AJCC 8th Edition - Clinical: Stage IIIA (cT3, cN1, cM0, G3, ER: Negative, PR: Negative, HER2: Positive) - Unsigned   SUMMARY OF ONCOLOGIC HISTORY:   Malignant neoplasm of upper-outer quadrant of right breast in female, estrogen receptor negative ([place])   07/09/2017 Initial Diagnosis    Patient palpated a right breast mass which was evaluated by mammogram and ultrasound and a breast MRI which revealed 4.5 x 4 cm necrotic tumor with suspicious multiple right axillary lymph nodes; biopsy revealed IDC grade 3 ER 0%, PR 0%, HER-2 positive ratio 3.08, Ki-67 70%, T2NX stage IIa/IIb    07/18/2017 - 11/04/2017 Neo-Adjuvant Chemotherapy    TCH Perjeta x6 cycles followed by Herceptin Perjeta maintenance    08/15/2017 Genetic Testing    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  The Multi-Gene Panel offered by [facility] includes sequencing and/or deletion duplication testing of the following 83 genes: ALK, APC, ATM, AXIN2,BAP1,  BARD1, BLM, BMPR1A, BRCA1, BRCA2, BRIP1, CASR, CDC73, CDH1, CDK4, CDKN1B, CDKN1C, CDKN2A (p14ARF), CDKN2A (p16INK4a), CEBPA, CHEK2, CTNNA1, DICER1, DIS3L2, EGFR (c.2369C>T, p.Thr790Met variant only), EPCAM (Deletion/duplication testing only), FH, FLCN, GATA2, GPC3, GREM1 (Promoter region deletion/duplication testing only), HOXB13 (c.251G>A, p.Gly84Glu), HRAS, KIT, MAX, MEN1, MET, MITF (c.952G>A, p.Glu318Lys variant only), MLH1, MSH2, MSH3, MSH6, MUTYH, NBN, NF1, NF2, NTHL1, PALB2, PDGFRA, PHOX2B, PMS2, POLD1, POLE, POT1, PRKAR1A, PTCH1, PTEN, RAD50, RAD51C, RAD51D, RB1, RECQL4, RET, RUNX1, SDHAF2, SDHA (sequence changes only), SDHB, SDHC, SDHD, SMAD4, SMARCA4, SMARCB1, SMARCE1, STK11, SUFU,  TERT, TERT, TMEM127, TP53, TSC1, TSC2, VHL, WRN and WT1.  The report date is August 15, 2017.    11/18/2017 Surgery    Bilateral mastectomies: Right mastectomy: IDC grade 3 3.8 cm margins negative, 0/4 lymph nodes negative, ER 0%, PR 0%, HER-2 negative ratio 1.3, IHC HER-2 negative; Ki-67 70%, RCB class II; left mastectomy: [facility]    12/18/2017 - 01/27/2018 Chemotherapy    Adjuvant chemotherapy with dose dense Adriamycin and Cytoxan x4    02/17/2018 -  Chemotherapy    Maintenance Herceptin and Perjeta    02/24/2018 Surgery    REMOVAL OF BILATERAL TISSUE EXPANDERS WITH PLACEMENT OF BILATERAL BREAST IMPLANTS and LIPOFILLING FROM ABDOMEN TO BILATERAL CHEST by Dr. Iran Planas      CURRENT THERAPY: Herceptin  INTERVAL HISTORY: [name] 48 y.o. female returns for evaluation prior to receiving Herceptin.  She is feeling well.  She has mild numbness in her toes.  She denies that it is worsening.  She says that she has tried Gabapentin for this and it didn't help.  She denies any balance issues.  She notes that she has been working at home, and continues to recover from fatigue.  She noted that earlier the nurse had a difficult time getting blood from her port, and was able to though.  She thinks she may have a couple of breast nodules that she wants me to look at.     Patient Active Problem List   Diagnosis Date Noted  . Breast cancer, right ([place]) 11/18/2017  . Genetic testing 08/19/2017  . Port-A-Cath in place 08/08/2017  . Malignant neoplasm of upper-outer quadrant of right breast in female, estrogen receptor negative ([place]) 07/14/2017  . Dependent edema 10/23/2015  . Episodic paroxysmal anxiety disorder 10/23/2015  .  Attention deficit disorder 10/23/2015  . Low TSH level 10/23/2015  . Obesity 10/23/2015  . History of abdominoplasty 10/23/2015  . Chest pain 09/09/2011    has No Known Allergies.  MEDICAL HISTORY: Past Medical History:  Diagnosis Date  . Anxiety   . Breast cancer  ([place])   . Diabetes mellitus without complication ([place])   . Diverticulitis   . GERD (gastroesophageal reflux disease)   . Hiatal hernia   . Hypertension   . Hypothyroidism   . LBBB (left bundle branch block)     SURGICAL HISTORY: Past Surgical History:  Procedure Laterality Date  . BREAST RECONSTRUCTION WITH PLACEMENT OF TISSUE EXPANDER AND ALLODERM Bilateral 11/18/2017   Procedure: BILATERAL BREAST RECONSTRUCTION WITH PLACEMENT OF TISSUE EXPANDER AND ALLODERM;  Surgeon: Irene Limbo, MD;  Location: [facility];  Service: Plastics;  Laterality: Bilateral;  . CESAREAN SECTION  2007/2010   x2  . LAPAROSCOPIC TUBAL LIGATION  06/12/2011   Procedure: LAPAROSCOPIC TUBAL LIGATION;  Surgeon: Daria Pastures, MD;  Location: [facility];  Service: Gynecology;  Laterality: N/A;  filshie clip  . LIPOSUCTION WITH LIPOFILLING Bilateral 02/24/2018   Procedure: LIPOFILLING FROM ABDOMEN TO BILATERAL CHEST;  Surgeon: Irene Limbo, MD;  Location: [facility];  Service: Plastics;  Laterality: Bilateral;  . MASTECTOMY W/ SENTINEL NODE BIOPSY Bilateral 11/18/2017   Procedure: BILATERAL TOTAL MASTECTOMIES WITH RIGHT SENTINEL LYMPH NODE BIOPSY;  Surgeon: Rolm Bookbinder, MD;  Location: [facility];  Service: General;  Laterality: Bilateral;  . mini tuck  2012   abdominal  . PORTACATH PLACEMENT Right 07/15/2017   Procedure: INSERTION PORT-A-CATH WITH ULTRASOUND;  Surgeon: Rolm Bookbinder, MD;  Location: [facility];  Service: General;  Laterality: Right;  . REMOVAL OF BILATERAL TISSUE EXPANDERS WITH PLACEMENT OF BILATERAL BREAST IMPLANTS Bilateral 02/24/2018   Procedure: REMOVAL OF BILATERAL TISSUE EXPANDERS WITH PLACEMENT OF BILATERAL BREAST IMPLANTS;  Surgeon: Irene Limbo, MD;  Location: [facility];  Service: Plastics;  Laterality: Bilateral;  . right breast cancer     upper-outer quadrant   . right ear surg  03/2008    Mohs  . TUBAL LIGATION    . US ECHOCARDIOGRAPHY  12-30-2007   EF 55-60%  . WISDOM TOOTH EXTRACTION      SOCIAL HISTORY: Social History   Socioeconomic History  . Marital status: Married    Spouse name: Not on file  . Number of children: 2  . Years of education: Not on file  . Highest education level: Not on file  Occupational History  . Occupation: Neurosurgeon  . Financial resource strain: Not on file  . Food insecurity:    Worry: Not on file    Inability: Not on file  . Transportation needs:    Medical: Not on file    Non-medical: Not on file  Tobacco Use  . Smoking status: Former Smoker    Packs/day: 0.25    Years: 6.00    Pack years: 1.50    Types: Cigarettes    Last attempt to quit: 03/29/2005    Years since quitting: 13.1  . Smokeless tobacco: Never Used  Substance and Sexual Activity  . Alcohol use: Yes    Comment: socially  . Drug use: No  . Sexual activity: Yes    Birth control/protection: Surgical    Comment: BTL  Lifestyle  . Physical activity:    Days per week: Not on file    Minutes per session: Not on  file  . Stress: Not on file  Relationships  . Social connections:    Talks on phone: Not on file    Gets together: Not on file    Attends religious service: Not on file    Active member of club or organization: Not on file    Attends meetings of clubs or organizations: Not on file    Relationship status: Not on file  . Intimate partner violence:    Fear of current or ex partner: Not on file    Emotionally abused: Not on file    Physically abused: Not on file    Forced sexual activity: Not on file  Other Topics Concern  . Not on file  Social History Narrative   ** Merged History Encounter **        FAMILY HISTORY: Family History  Problem Relation Age of Onset  . Lymphoma Father   . Cancer Father        lymphoma  . Diabetes Brother   . Diabetes Mother   . Liver disease Mother        NASH, d. 54  . Stomach cancer Neg Hx    . Colon cancer Neg Hx     Review of Systems  Constitutional: Positive for fatigue. Negative for appetite change, chills, fever and unexpected weight change.  HENT:   Negative for hearing loss, lump/mass, mouth sores and trouble swallowing.   Eyes: Negative for eye problems and icterus.  Respiratory: Negative for chest tightness, cough and shortness of breath.   Cardiovascular: Negative for chest pain, leg swelling and palpitations.  Gastrointestinal: Negative for abdominal distention, abdominal pain, constipation, diarrhea, nausea and vomiting.  Endocrine: Negative for hot flashes.  Musculoskeletal: Negative for arthralgias and gait problem.  Skin: Negative for itching and rash.  Neurological: Positive for numbness. Negative for dizziness, extremity weakness, gait problem and headaches.  Hematological: Negative for adenopathy. Does not bruise/bleed easily.  Psychiatric/Behavioral: Negative for depression. The patient is not nervous/anxious.       PHYSICAL EXAMINATION  ECOG PERFORMANCE STATUS: 1 - Symptomatic but completely ambulatory  Vitals:   06/02/18 0918  BP: 132/90  Pulse: 79  Resp: 18  Temp: 98.4 F (36.9 C)  SpO2: 99%    Physical Exam Constitutional:      General: She is not in acute distress.    Appearance: Normal appearance.  HENT:     Head: Normocephalic and atraumatic.     Mouth/Throat:     Mouth: Mucous membranes are moist.     Pharynx: Oropharynx is clear. No oropharyngeal exudate or posterior oropharyngeal erythema.  Eyes:     General: No scleral icterus.    Pupils: Pupils are equal, round, and reactive to light.  Neck:     Musculoskeletal: Neck supple.  Cardiovascular:     Rate and Rhythm: Normal rate and regular rhythm.     Pulses: Normal pulses.     Heart sounds: Normal heart sounds.  Pulmonary:     Effort: Pulmonary effort is normal.     Breath sounds: Normal breath sounds.     Comments: Right breast, unable to palpate any nodules, there is an  area under the mastectomy site that feels like the implant Abdominal:     General: Abdomen is flat. There is no distension.     Palpations: Abdomen is soft.     Tenderness: There is no abdominal tenderness.  Musculoskeletal:        General: No swelling.  Lymphadenopathy:  Cervical: No cervical adenopathy.  Skin:    General: Skin is warm and dry.     Capillary Refill: Capillary refill takes less than 2 seconds.     Findings: No rash.  Neurological:     General: No focal deficit present.     Mental Status: She is alert.  Psychiatric:        Mood and Affect: Mood normal.        Behavior: Behavior normal.     LABORATORY DATA:  CBC    Component Value Date/Time   WBC 5.5 06/02/2018 0837   WBC 8.8 10/17/2015 1140   RBC 4.47 06/02/2018 0837   HGB 11.2 (L) 06/02/2018 0837   HCT 35.6 (L) 06/02/2018 0837   PLT 236 06/02/2018 0837   MCV 79.6 (L) 06/02/2018 0837   MCH 25.1 (L) 06/02/2018 0837   MCHC 31.5 06/02/2018 0837   RDW 14.8 06/02/2018 0837   LYMPHSABS 1.1 06/02/2018 0837   MONOABS 0.5 06/02/2018 0837   EOSABS 0.1 06/02/2018 0837   BASOSABS 0.0 06/02/2018 0837    CMP     Component Value Date/Time   NA 141 06/02/2018 0837   K 4.6 06/02/2018 0837   CL 104 06/02/2018 0837   CO2 27 06/02/2018 0837   GLUCOSE 104 (H) 06/02/2018 0837   BUN 12 06/02/2018 0837   CREATININE 0.90 06/02/2018 0837   CREATININE 0.77 10/17/2015 1140   CALCIUM 9.7 06/02/2018 0837   PROT 7.0 06/02/2018 0837   ALBUMIN 3.9 06/02/2018 0837   AST 16 06/02/2018 0837   ALT 15 06/02/2018 0837   ALKPHOS 76 06/02/2018 0837   BILITOT 0.5 06/02/2018 0837   GFRNONAA >60 06/02/2018 0837   GFRNONAA >89 10/17/2015 1140   GFRAA >60 06/02/2018 0837   GFRAA >89 10/17/2015 1140      ASSESSMENT and PLAN:   Malignant neoplasm of upper-outer quadrant of right breast in female, estrogen receptor negative ([place]) 07/09/2017:Patient palpated a right breast mass which was evaluated by mammogram and ultrasound  and a breast MRI which revealed 4.5 x 4 cm necrotic tumor with suspicious multiple right axillary lymph nodes; biopsy revealed IDC grade 3 ER 0%, PR 0%, HER-2 positive ratio 3.08, Ki-67 70%, T2NX stage IIa/IIb  Treatment planbased on multidisciplinary tumor board: 1. Neoadjuvant chemotherapy with TCH Perjeta 6 cycles followed by Herceptinand Perjetamaintenance for 1 year 2. Followed bybilateral mastectomies with targeted node dissection on the right7/23/2019 3.Due to significant residual disease, additional adjuvant chemo with Adriamycin and Cytoxan 12/18/2017-01/27/2018 4. Followed by adjuvant radiation therapy ----------------------------------------------------------------------------- 11/18/2017:Bilateral mastectomies: Right mastectomy: IDC grade 3 3.8 cm margins negative, 0/4 lymph nodes negative, ER 0%, PR 0%, HER-2 negative ratio 1.3, IHC HER-2 negative; Ki-67 70%, RCB class II; left mastectomy: [facility]  Current treatment: Herceptin  Maintenance (Perjeta discontinued for diarrhea) Echo 05/15/2018 shows EF of 50-55% (followed by Pierre Bali MD)  Toxicities: 1.  Diarrhea: resolved 2. Nail dyscrasia: healing 3.  Slight tingling in the right side of the face probably due to not drinking enough water--resolved 4. Right breast concern: think this is the implant, she will keep an eye on it, if continues I recommended she see Dr. Iran Planas for eval. 5.  Fatigue: improving. 6. Peripheral neuropathy: Recommended PT, patient declined at this point    Return to clinic every 3 weeks for Herceptin every 6 weeks for follow-up with myself or Dr. Lindi Adie.   All questions were answered. The patient knows to call the clinic with any problems, questions or concerns. We can  certainly see the patient much sooner if necessary.  A total of (30) minutes of face-to-face time was spent with this patient with greater than 50% of that time in counseling and care-coordination.  This note was electronically  signed. Scot Dock, NP 06/02/2018

## 2018-06-02 NOTE — Patient Instructions (Signed)
[facility] Discharge Instructions for Patients Receiving Chemotherapy  Today you received the following chemotherapy agents Herceptin  To help prevent nausea and vomiting after your treatment, we encourage you to take your nausea medication as directed   If you develop nausea and vomiting that is not controlled by your nausea medication, call the clinic.   BELOW ARE SYMPTOMS THAT SHOULD BE REPORTED IMMEDIATELY:  *FEVER GREATER THAN 100.5 F  *CHILLS WITH OR WITHOUT FEVER  NAUSEA AND VOMITING THAT IS NOT CONTROLLED WITH YOUR NAUSEA MEDICATION  *UNUSUAL SHORTNESS OF BREATH  *UNUSUAL BRUISING OR BLEEDING  TENDERNESS IN MOUTH AND THROAT WITH OR WITHOUT PRESENCE OF ULCERS  *URINARY PROBLEMS  *BOWEL PROBLEMS  UNUSUAL RASH Items with * indicate a potential emergency and should be followed up as soon as possible.  Feel free to call the clinic should you have any questions or concerns. The clinic phone number is [phone].  Please show the CHEMO ALERT CARD at check-in to the Emergency Department and triage nurse.   

## 2018-06-02 NOTE — Progress Notes (Signed)
When deaccessing port, was unable to obtain blood return. Flushed port with 30 ccs of NS and had patient change positions. Per Thedore Mins, NP, ok to deaccess port. If pt has difficulties with blood return during next port access, RN is to contact Thedore Mins, NP to discuss further interventions.

## 2018-06-02 NOTE — Assessment & Plan Note (Addendum)
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 Neoadjuvant chemotherapy with TCH Perjeta 6 cycles followed by Herceptinand Perjetamaintenance for 1 year 2. Followed bybilateral mastectomies with targeted node dissection on the right7/23/2019 3.Due to significant residual disease, additional adjuvant chemo with Adriamycin and Cytoxan 12/18/2017-01/27/2018 4. Followed by adjuvant radiation therapy ----------------------------------------------------------------------------- 11/18/2017:Bilateral mastectomies: Right mastectomy: IDC grade 3 3.8 cm margins negative, 0/4 lymph nodes negative, ER 0%, PR 0%, HER-2 negative ratio 1.3, IHC HER-2 negative; Ki-67 70%, RCB class II; left mastectomy: [facility]  Current treatment: Herceptin  Maintenance (Perjeta discontinued for diarrhea) Echo 05/15/2018 shows EF of 50-55% (followed by Pierre Bali MD)  Toxicities: 1.  Diarrhea: resolved 2. Nail dyscrasia: healing 3.  Slight tingling in the right side of the face probably due to not drinking enough water--resolved 4. Right breast concern: think this is the implant, she will keep an eye on it, if continues I recommended she see Dr. Iran Planas for eval. 5.  Fatigue: improving. 6. Peripheral neuropathy: Recommended PT, patient declined at this point    Return to clinic every 3 weeks for Herceptin every 6 weeks for follow-up with myself or Dr. Lindi Adie.

## 2018-06-03 ENCOUNTER — Other Ambulatory Visit: Payer: Self-pay | Admitting: Hematology and Oncology

## 2018-06-21 ENCOUNTER — Other Ambulatory Visit: Payer: Self-pay | Admitting: Hematology and Oncology

## 2018-06-23 ENCOUNTER — Inpatient Hospital Stay: Payer: BLUE CROSS/BLUE SHIELD

## 2018-06-23 VITALS — BP 119/83 | HR 76 | Temp 98.1°F | Resp 18

## 2018-06-23 DIAGNOSIS — C50411 Malignant neoplasm of upper-outer quadrant of right female breast: Secondary | ICD-10-CM

## 2018-06-23 DIAGNOSIS — Z95828 Presence of other vascular implants and grafts: Secondary | ICD-10-CM

## 2018-06-23 DIAGNOSIS — Z171 Estrogen receptor negative status [ER-]: Principal | ICD-10-CM

## 2018-06-23 LAB — CBC WITH DIFFERENTIAL (CANCER CENTER ONLY)
Abs Immature Granulocytes: 0.03 10*3/uL (ref 0.00–0.07)
BASOS ABS: 0 10*3/uL (ref 0.0–0.1)
Basophils Relative: 1 %
Eosinophils Absolute: 0.1 10*3/uL (ref 0.0–0.5)
Eosinophils Relative: 2 %
HCT: 37 % (ref 36.0–46.0)
Hemoglobin: 11.4 g/dL — ABNORMAL LOW (ref 12.0–15.0)
IMMATURE GRANULOCYTES: 1 %
Lymphocytes Relative: 21 %
Lymphs Abs: 1.3 10*3/uL (ref 0.7–4.0)
MCH: 24.4 pg — ABNORMAL LOW (ref 26.0–34.0)
MCHC: 30.8 g/dL (ref 30.0–36.0)
MCV: 79.1 fL — ABNORMAL LOW (ref 80.0–100.0)
Monocytes Absolute: 0.6 10*3/uL (ref 0.1–1.0)
Monocytes Relative: 9 %
NEUTROS ABS: 4 10*3/uL (ref 1.7–7.7)
NEUTROS PCT: 66 %
Platelet Count: 233 10*3/uL (ref 150–400)
RBC: 4.68 MIL/uL (ref 3.87–5.11)
RDW: 15.4 % (ref 11.5–15.5)
WBC Count: 6 10*3/uL (ref 4.0–10.5)
nRBC: 0 % (ref 0.0–0.2)

## 2018-06-23 LAB — CMP (CANCER CENTER ONLY)
ALT: 18 U/L (ref 0–44)
AST: 16 U/L (ref 15–41)
Albumin: 4 g/dL (ref 3.5–5.0)
Alkaline Phosphatase: 72 U/L (ref 38–126)
Anion gap: 10 (ref 5–15)
BUN: 17 mg/dL (ref 6–20)
CHLORIDE: 102 mmol/L (ref 98–111)
CO2: 28 mmol/L (ref 22–32)
Calcium: 9.6 mg/dL (ref 8.9–10.3)
Creatinine: 0.87 mg/dL (ref 0.44–1.00)
GFR, Est AFR Am: >60 mL/min (ref >60)
GFR, Estimated: >60 mL/min (ref >60)
Glucose, Bld: 100 mg/dL — ABNORMAL HIGH (ref 70–99)
Potassium: 4.6 mmol/L (ref 3.5–5.1)
Sodium: 140 mmol/L (ref 135–145)
Total Bilirubin: 0.4 mg/dL (ref 0.3–1.2)
Total Protein: 7 g/dL (ref 6.5–8.1)

## 2018-06-23 MED ORDER — ACETAMINOPHEN 325 MG PO TABS
650.0000 mg | ORAL_TABLET | Freq: Once | ORAL | Status: AC
  Administered 2018-06-23: 650 mg via ORAL

## 2018-06-23 MED ORDER — DIPHENHYDRAMINE HCL 25 MG PO CAPS
ORAL_CAPSULE | ORAL | Status: AC
  Filled 2018-06-23: qty 2

## 2018-06-23 MED ORDER — SODIUM CHLORIDE 0.9% FLUSH
10.0000 mL | INTRAVENOUS | Status: DC | PRN
  Administered 2018-06-23: 10 mL
  Filled 2018-06-23: qty 10

## 2018-06-23 MED ORDER — HEPARIN SOD (PORK) LOCK FLUSH 100 UNIT/ML IV SOLN
500.0000 [IU] | Freq: Once | INTRAVENOUS | Status: AC | PRN
  Administered 2018-06-23: 500 [IU]
  Filled 2018-06-23: qty 5

## 2018-06-23 MED ORDER — SODIUM CHLORIDE 0.9 % IV SOLN
Freq: Once | INTRAVENOUS | Status: AC
  Administered 2018-06-23: 09:00:00 via INTRAVENOUS
  Filled 2018-06-23: qty 250

## 2018-06-23 MED ORDER — TRASTUZUMAB CHEMO 150 MG IV SOLR
600.0000 mg | Freq: Once | INTRAVENOUS | Status: AC
  Administered 2018-06-23: 600 mg via INTRAVENOUS
  Filled 2018-06-23: qty 28.57

## 2018-06-23 MED ORDER — ACETAMINOPHEN 325 MG PO TABS
ORAL_TABLET | ORAL | Status: AC
  Filled 2018-06-23: qty 2

## 2018-06-23 MED ORDER — DIPHENHYDRAMINE HCL 25 MG PO CAPS
50.0000 mg | ORAL_CAPSULE | Freq: Once | ORAL | Status: AC
  Administered 2018-06-23: 50 mg via ORAL

## 2018-06-23 NOTE — Patient Instructions (Signed)
[facility] Discharge Instructions for Patients Receiving Chemotherapy  Today you received the following chemotherapy agents Herceptin  To help prevent nausea and vomiting after your treatment, we encourage you to take your nausea medication as directed   If you develop nausea and vomiting that is not controlled by your nausea medication, call the clinic.   BELOW ARE SYMPTOMS THAT SHOULD BE REPORTED IMMEDIATELY:  *FEVER GREATER THAN 100.5 F  *CHILLS WITH OR WITHOUT FEVER  NAUSEA AND VOMITING THAT IS NOT CONTROLLED WITH YOUR NAUSEA MEDICATION  *UNUSUAL SHORTNESS OF BREATH  *UNUSUAL BRUISING OR BLEEDING  TENDERNESS IN MOUTH AND THROAT WITH OR WITHOUT PRESENCE OF ULCERS  *URINARY PROBLEMS  *BOWEL PROBLEMS  UNUSUAL RASH Items with * indicate a potential emergency and should be followed up as soon as possible.  Feel free to call the clinic should you have any questions or concerns. The clinic phone number is [phone].  Please show the CHEMO ALERT CARD at check-in to the Emergency Department and triage nurse.   

## 2018-06-26 MED ORDER — FULVESTRANT 250 MG/5ML IM SOLN
INTRAMUSCULAR | Status: AC
  Filled 2018-06-26: qty 10

## 2018-07-09 NOTE — Progress Notes (Signed)
Patient Care Team: Deland Pretty, MD as PCP - General (Internal Medicine) Baxley, Cresenciano Lick, MD (Internal Medicine)  DIAGNOSIS:    ICD-10-CM   1. Malignant neoplasm of upper-outer quadrant of right breast in female, estrogen receptor negative ([place]) C50.411    Z17.1     SUMMARY OF ONCOLOGIC HISTORY:   Malignant neoplasm of upper-outer quadrant of right breast in female, estrogen receptor negative ([place])   07/09/2017 Initial Diagnosis    Patient palpated a right breast mass which was evaluated by mammogram and ultrasound and a breast MRI which revealed 4.5 x 4 cm necrotic tumor with suspicious multiple right axillary lymph nodes; biopsy revealed IDC grade 3 ER 0%, PR 0%, HER-2 positive ratio 3.08, Ki-67 70%, T2NX stage IIa/IIb    07/18/2017 - 11/04/2017 Neo-Adjuvant Chemotherapy    TCH Perjeta x6 cycles followed by Herceptin Perjeta maintenance    08/15/2017 Genetic Testing    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  The Multi-Gene Panel offered by [facility] includes sequencing and/or deletion duplication testing of the following 83 genes: ALK, APC, ATM, AXIN2,BAP1,  BARD1, BLM, BMPR1A, BRCA1, BRCA2, BRIP1, CASR, CDC73, CDH1, CDK4, CDKN1B, CDKN1C, CDKN2A (p14ARF), CDKN2A (p16INK4a), CEBPA, CHEK2, CTNNA1, DICER1, DIS3L2, EGFR (c.2369C>T, p.Thr790Met variant only), EPCAM (Deletion/duplication testing only), FH, FLCN, GATA2, GPC3, GREM1 (Promoter region deletion/duplication testing only), HOXB13 (c.251G>A, p.Gly84Glu), HRAS, KIT, MAX, MEN1, MET, MITF (c.952G>A, p.Glu318Lys variant only), MLH1, MSH2, MSH3, MSH6, MUTYH, NBN, NF1, NF2, NTHL1, PALB2, PDGFRA, PHOX2B, PMS2, POLD1, POLE, POT1, PRKAR1A, PTCH1, PTEN, RAD50, RAD51C, RAD51D, RB1, RECQL4, RET, RUNX1, SDHAF2, SDHA (sequence changes only), SDHB, SDHC, SDHD, SMAD4, SMARCA4, SMARCB1, SMARCE1, STK11, SUFU, TERT, TERT, TMEM127, TP53, TSC1, TSC2, VHL, WRN and WT1.  The report date is August 15, 2017.    11/18/2017 Surgery   Bilateral mastectomies: Right mastectomy: IDC grade 3 3.8 cm margins negative, 0/4 lymph nodes negative, ER 0%, PR 0%, HER-2 negative ratio 1.3, IHC HER-2 negative; Ki-67 70%, RCB class II; left mastectomy: [facility]    12/18/2017 - 01/27/2018 Chemotherapy    Adjuvant chemotherapy with dose dense Adriamycin and Cytoxan x4    02/17/2018 -  Chemotherapy    Maintenance Herceptin and Perjeta    02/24/2018 Surgery    REMOVAL OF BILATERAL TISSUE EXPANDERS WITH PLACEMENT OF BILATERAL BREAST IMPLANTS and LIPOFILLING FROM ABDOMEN TO BILATERAL CHEST by Dr. Iran Planas      CHIEF COMPLIANT: Follow-up of maintenance Herceptin  INTERVAL HISTORY: Rebecca [name] is a 48 y.o. with above-mentioned history of HER-2 positive breast cancer who is currently receiving Herceptin maintenance. She presents to the clinic alone today and is tolerating treatment well but notes significant stress from her family lately. She denies diarrhea.   REVIEW OF SYSTEMS:   Constitutional: Denies fevers, chills or abnormal weight loss Eyes: Denies blurriness of vision Ears, nose, mouth, throat, and face: Denies mucositis or sore throat Respiratory: Denies cough, dyspnea or wheezes Cardiovascular: Denies palpitation, chest discomfort Gastrointestinal: Denies nausea, heartburn or change in bowel habits Skin: Denies abnormal skin rashes Lymphatics: Denies new lymphadenopathy or easy bruising Neurological: Denies numbness, tingling or new weaknesses Behavioral/Psych: Mood is stable, no new changes  Extremities: No lower extremity edema Breast: denies any pain or lumps or nodules in either breasts All other systems were reviewed with the patient and are negative.  I have reviewed the past medical history, past surgical history, social history and family history with the patient and they are unchanged from previous note.  ALLERGIES:  has No Known Allergies.  MEDICATIONS:  Current Outpatient Medications  Medication Sig Dispense  Refill  . carvedilol (COREG) 3.125 MG tablet Take 1 tablet (3.125 mg total) by mouth 2 (two) times daily. 60 tablet 3  . diphenoxylate-atropine (LOMOTIL) 2.5-0.025 MG tablet Take 1 tablet by mouth 4 (four) times daily as needed for diarrhea or loose stools. 30 tablet 2  . esomeprazole (NEXIUM) 40 MG capsule TAKE 1 CAPSULE (40 MG TOTAL) BY MOUTH DAILY AT 12 NOON. 90 capsule 2  . levothyroxine (SYNTHROID, LEVOTHROID) 137 MCG tablet Take 137 mcg by mouth daily before breakfast.    . LORazepam (ATIVAN) 1 MG tablet Take 1 tablet (1 mg total) by mouth every 8 (eight) hours as needed (nausea and vomiting). 30 tablet 0  . losartan (COZAAR) 25 MG tablet TAKE 1 TABLET BY MOUTH EVERY DAY 30 tablet 5  . metFORMIN (GLUCOPHAGE) 500 MG tablet Take by mouth daily with breakfast.    . rosuvastatin (CRESTOR) 20 MG tablet Take 1 tablet (20 mg total) by mouth daily. 90 tablet 3  . triamterene-hydrochlorothiazide (MAXZIDE) 75-50 MG tablet TAKE 1 TAB DAILY FOR EDEMA 30 tablet 0   No current facility-administered medications for this visit.    Facility-Administered Medications Ordered in Other Visits  Medication Dose Route Frequency Provider Last Rate Last Dose  . LORazepam (ATIVAN) injection 1 mg  1 mg Intravenous Once Sandi Mealy E., PA-C      . sodium chloride flush (NS) 0.9 % injection 10 mL  10 mL Intracatheter PRN Nicholas Lose, MD   10 mL at 07/14/18 0855    PHYSICAL EXAMINATION: ECOG PERFORMANCE STATUS: 1 - Symptomatic but completely ambulatory  Vitals:   07/14/18 0906  BP: (!) 142/96  Pulse: 80  Resp: 19  Temp: 98.5 F (36.9 C)  SpO2: 98%   Filed Weights   07/14/18 0906  Weight: 235 lb 8 oz (106.8 kg)    GENERAL: alert, no distress and comfortable SKIN: skin color, texture, turgor are normal, no rashes or significant lesions EYES: normal, Conjunctiva are pink and non-injected, sclera clear OROPHARYNX: no exudate, no erythema and lips, buccal mucosa, and tongue normal  NECK: supple, thyroid  normal size, non-tender, without nodularity LYMPH: no palpable lymphadenopathy in the cervical, axillary or inguinal LUNGS: clear to auscultation and percussion with normal breathing effort HEART: regular rate & rhythm and no murmurs and no lower extremity edema ABDOMEN: abdomen soft, non-tender and normal bowel sounds MUSCULOSKELETAL: no cyanosis of digits and no clubbing  NEURO: alert & oriented x 3 with fluent speech, no focal motor/sensory deficits EXTREMITIES: No lower extremity edema  LABORATORY DATA:  I have reviewed the data as listed CMP Latest Ref Rng & Units 07/14/2018 06/23/2018 06/02/2018  Glucose 70 - 99 mg/dL 113(H) 100(H) 104(H)  BUN 6 - 20 mg/dL _0 Creatinine 0.44 - 1.00 mg/dL 0.94 0.87 0.90  Sodium 135 - 145 mmol/L 143 140 141  Potassium 3.5 - 5.1 mmol/L 4.6 4.6 4.6  Chloride 98 - 111 mmol/L 107 102 104  CO2 22 - 32 mmol/L _1 Calcium 8.9 - 10.3 mg/dL 9.6 9.6 9.7  Total Protein 6.5 - 8.1 g/dL 7.0 7.0 7.0  Total Bilirubin 0.3 - 1.2 mg/dL 0.6 0.4 0.5  Alkaline Phos 38 - 126 U/L 71 72 76  AST 15 - 41 U/L _2 ALT 0 - 44 U/L _3 Lab Results  Component Value Date   WBC 6.3 07/14/2018   HGB 11.1 (  L) 07/14/2018   HCT 36.6 07/14/2018   MCV 81.5 07/14/2018   PLT 253 07/14/2018   NEUTROABS 4.4 07/14/2018    ASSESSMENT & PLAN:  Malignant neoplasm of upper-outer quadrant of right breast in female, estrogen receptor negative ([place]) 07/09/2017:Patient palpated a right breast mass which was evaluated by mammogram and ultrasound and a breast MRI which revealed 4.5 x 4 cm necrotic tumor with suspicious multiple right axillary lymph nodes; biopsy revealed IDC grade 3 ER 0%, PR 0%, HER-2 positive ratio 3.08, Ki-67 70%, T2NX stage IIa/IIb  Treatment planbased on multidisciplinary tumor board: 1. Neoadjuvant chemotherapy with TCH Perjeta 6 cycles followed by Herceptinand Perjetamaintenance for 1 year 2. Followed bybilateral mastectomies with targeted  node dissection on the right7/23/2019 3.Due to significant residual disease, additional adjuvant chemo with Adriamycin and Cytoxan 12/18/2017-01/27/2018 4. Followed by adjuvant radiation therapy ----------------------------------------------------------------------------- 11/18/2017:Bilateral mastectomies: Right mastectomy: IDC grade 3 3.8 cm margins negative, 0/4 lymph nodes negative, ER 0%, PR 0%, HER-2 negative ratio 1.3, IHC HER-2 negative; Ki-67 70%, RCB class II; left mastectomy: [facility]  Current treatment: Herceptin Maintenance (Perjeta discontinued for diarrhea) Echo 05/15/2018 shows EF of 50-55% (followed by Pierre Bali MD)  Toxicities: 1.  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  Fatigue  See significant emotional stressors related to her family situation. I offered her counseling but she refused. Return to clinic every 3 weeks for Herceptin every 6 weeks for follow-up with [place]    No orders of the defined types were placed in this encounter.  The patient has a good understanding of the overall plan. she agrees with it. she will call with any problems that may develop before the next visit here.  Nicholas Lose, MD 07/14/2018  Julious Oka Dorshimer am acting as scribe for Dr. Nicholas Lose.  I have reviewed the above documentation for accuracy and completeness, and I agree with the above.

## 2018-07-14 ENCOUNTER — Inpatient Hospital Stay: Payer: BLUE CROSS/BLUE SHIELD | Attending: Hematology and Oncology

## 2018-07-14 ENCOUNTER — Inpatient Hospital Stay (HOSPITAL_BASED_OUTPATIENT_CLINIC_OR_DEPARTMENT_OTHER): Payer: BLUE CROSS/BLUE SHIELD | Admitting: Hematology and Oncology

## 2018-07-14 ENCOUNTER — Other Ambulatory Visit: Payer: Self-pay

## 2018-07-14 ENCOUNTER — Inpatient Hospital Stay: Payer: BLUE CROSS/BLUE SHIELD

## 2018-07-14 DIAGNOSIS — Z79899 Other long term (current) drug therapy: Secondary | ICD-10-CM | POA: Diagnosis not present

## 2018-07-14 DIAGNOSIS — Z9013 Acquired absence of bilateral breasts and nipples: Secondary | ICD-10-CM | POA: Diagnosis not present

## 2018-07-14 DIAGNOSIS — Z171 Estrogen receptor negative status [ER-]: Secondary | ICD-10-CM | POA: Insufficient documentation

## 2018-07-14 DIAGNOSIS — Z9221 Personal history of antineoplastic chemotherapy: Secondary | ICD-10-CM | POA: Diagnosis not present

## 2018-07-14 DIAGNOSIS — Z95828 Presence of other vascular implants and grafts: Secondary | ICD-10-CM

## 2018-07-14 DIAGNOSIS — R5383 Other fatigue: Secondary | ICD-10-CM | POA: Diagnosis not present

## 2018-07-14 DIAGNOSIS — C50411 Malignant neoplasm of upper-outer quadrant of right female breast: Secondary | ICD-10-CM

## 2018-07-14 DIAGNOSIS — G620 Drug-induced polyneuropathy: Secondary | ICD-10-CM | POA: Insufficient documentation

## 2018-07-14 DIAGNOSIS — Z5112 Encounter for antineoplastic immunotherapy: Secondary | ICD-10-CM | POA: Diagnosis not present

## 2018-07-14 LAB — CMP (CANCER CENTER ONLY)
ALT: 25 U/L (ref 0–44)
AST: 22 U/L (ref 15–41)
Albumin: 3.9 g/dL (ref 3.5–5.0)
Alkaline Phosphatase: 71 U/L (ref 38–126)
Anion gap: 10 (ref 5–15)
BUN: 14 mg/dL (ref 6–20)
CO2: 26 mmol/L (ref 22–32)
Calcium: 9.6 mg/dL (ref 8.9–10.3)
Chloride: 107 mmol/L (ref 98–111)
Creatinine: 0.94 mg/dL (ref 0.44–1.00)
Glucose, Bld: 113 mg/dL — ABNORMAL HIGH (ref 70–99)
Potassium: 4.6 mmol/L (ref 3.5–5.1)
Sodium: 143 mmol/L (ref 135–145)
Total Bilirubin: 0.6 mg/dL (ref 0.3–1.2)
Total Protein: 7 g/dL (ref 6.5–8.1)

## 2018-07-14 LAB — CBC WITH DIFFERENTIAL (CANCER CENTER ONLY)
Abs Immature Granulocytes: 0.01 10*3/uL (ref 0.00–0.07)
Basophils Absolute: 0 10*3/uL (ref 0.0–0.1)
Basophils Relative: 1 %
Eosinophils Absolute: 0.1 10*3/uL (ref 0.0–0.5)
Eosinophils Relative: 2 %
HEMATOCRIT: 36.6 % (ref 36.0–46.0)
Hemoglobin: 11.1 g/dL — ABNORMAL LOW (ref 12.0–15.0)
Immature Granulocytes: 0 %
Lymphocytes Relative: 19 %
Lymphs Abs: 1.2 10*3/uL (ref 0.7–4.0)
MCH: 24.7 pg — ABNORMAL LOW (ref 26.0–34.0)
MCHC: 30.3 g/dL (ref 30.0–36.0)
MCV: 81.5 fL (ref 80.0–100.0)
Monocytes Absolute: 0.5 10*3/uL (ref 0.1–1.0)
Monocytes Relative: 8 %
NEUTROS PCT: 70 %
NRBC: 0 % (ref 0.0–0.2)
Neutro Abs: 4.4 10*3/uL (ref 1.7–7.7)
Platelet Count: 253 10*3/uL (ref 150–400)
RBC: 4.49 MIL/uL (ref 3.87–5.11)
RDW: 16.5 % — ABNORMAL HIGH (ref 11.5–15.5)
WBC: 6.3 10*3/uL (ref 4.0–10.5)

## 2018-07-14 MED ORDER — ACETAMINOPHEN 325 MG PO TABS
650.0000 mg | ORAL_TABLET | Freq: Once | ORAL | Status: AC
  Administered 2018-07-14: 650 mg via ORAL

## 2018-07-14 MED ORDER — TRASTUZUMAB CHEMO 150 MG IV SOLR
600.0000 mg | Freq: Once | INTRAVENOUS | Status: AC
  Administered 2018-07-14: 600 mg via INTRAVENOUS
  Filled 2018-07-14: qty 28.57

## 2018-07-14 MED ORDER — SODIUM CHLORIDE 0.9% FLUSH
10.0000 mL | INTRAVENOUS | Status: DC | PRN
  Administered 2018-07-14: 10 mL
  Filled 2018-07-14: qty 10

## 2018-07-14 MED ORDER — ACETAMINOPHEN 325 MG PO TABS
ORAL_TABLET | ORAL | Status: AC
  Filled 2018-07-14: qty 2

## 2018-07-14 MED ORDER — DIPHENHYDRAMINE HCL 25 MG PO CAPS
ORAL_CAPSULE | ORAL | Status: AC
  Filled 2018-07-14: qty 2

## 2018-07-14 MED ORDER — HEPARIN SOD (PORK) LOCK FLUSH 100 UNIT/ML IV SOLN
500.0000 [IU] | Freq: Once | INTRAVENOUS | Status: AC | PRN
  Administered 2018-07-14: 500 [IU]
  Filled 2018-07-14: qty 5

## 2018-07-14 MED ORDER — DIPHENHYDRAMINE HCL 25 MG PO CAPS
50.0000 mg | ORAL_CAPSULE | Freq: Once | ORAL | Status: AC
  Administered 2018-07-14: 50 mg via ORAL

## 2018-07-14 MED ORDER — SODIUM CHLORIDE 0.9 % IV SOLN
Freq: Once | INTRAVENOUS | Status: AC
  Administered 2018-07-14: 10:00:00 via INTRAVENOUS
  Filled 2018-07-14: qty 250

## 2018-07-14 NOTE — Patient Instructions (Signed)
[facility] Discharge Instructions for Patients Receiving Chemotherapy  Today you received the following chemotherapy agents Trastuzumab (HERCEPTIN).  To help prevent nausea and vomiting after your treatment, we encourage you to take your nausea medication as prescribed.   If you develop nausea and vomiting that is not controlled by your nausea medication, call the clinic.   BELOW ARE SYMPTOMS THAT SHOULD BE REPORTED IMMEDIATELY:  *FEVER GREATER THAN 100.5 F  *CHILLS WITH OR WITHOUT FEVER  NAUSEA AND VOMITING THAT IS NOT CONTROLLED WITH YOUR NAUSEA MEDICATION  *UNUSUAL SHORTNESS OF BREATH  *UNUSUAL BRUISING OR BLEEDING  TENDERNESS IN MOUTH AND THROAT WITH OR WITHOUT PRESENCE OF ULCERS  *URINARY PROBLEMS  *BOWEL PROBLEMS  UNUSUAL RASH Items with * indicate a potential emergency and should be followed up as soon as possible.  Feel free to call the clinic should you have any questions or concerns. The clinic phone number is [phone].  Please show the CHEMO ALERT CARD at check-in to the Emergency Department and triage nurse.   

## 2018-07-14 NOTE — Assessment & Plan Note (Signed)
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 Neoadjuvant chemotherapy with TCH Perjeta 6 cycles followed by Herceptinand Perjetamaintenance for 1 year 2. Followed bybilateral mastectomies with targeted node dissection on the right7/23/2019 3.Due to significant residual disease, additional adjuvant chemo with Adriamycin and Cytoxan 12/18/2017-01/27/2018 4. Followed by adjuvant radiation therapy ----------------------------------------------------------------------------- 11/18/2017:Bilateral mastectomies: Right mastectomy: IDC grade 3 3.8 cm margins negative, 0/4 lymph nodes negative, ER 0%, PR 0%, HER-2 negative ratio 1.3, IHC HER-2 negative; Ki-67 70%, RCB class II; left mastectomy: [facility]  Current treatment: Herceptin Maintenance (Perjeta discontinued for diarrhea) Echo 05/15/2018 shows EF of 50-55% (followed by Pierre Bali MD)  Toxicities: 1.  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  Fatigue  Return to clinic every 3 weeks for Herceptin every 6 weeks for follow-up with [place]

## 2018-07-19 ENCOUNTER — Other Ambulatory Visit: Payer: Self-pay | Admitting: Hematology and Oncology

## 2018-07-19 ENCOUNTER — Other Ambulatory Visit (HOSPITAL_COMMUNITY): Payer: Self-pay | Admitting: Internal Medicine

## 2018-07-20 ENCOUNTER — Other Ambulatory Visit (HOSPITAL_COMMUNITY): Payer: Self-pay | Admitting: Internal Medicine

## 2018-07-31 ENCOUNTER — Inpatient Hospital Stay: Payer: BLUE CROSS/BLUE SHIELD

## 2018-07-31 ENCOUNTER — Other Ambulatory Visit: Payer: Self-pay

## 2018-07-31 ENCOUNTER — Inpatient Hospital Stay: Payer: BLUE CROSS/BLUE SHIELD | Attending: Hematology and Oncology

## 2018-07-31 ENCOUNTER — Other Ambulatory Visit: Payer: Self-pay | Admitting: Hematology and Oncology

## 2018-07-31 VITALS — BP 133/95 | HR 76 | Temp 98.3°F | Resp 18

## 2018-07-31 DIAGNOSIS — Z9013 Acquired absence of bilateral breasts and nipples: Secondary | ICD-10-CM | POA: Diagnosis not present

## 2018-07-31 DIAGNOSIS — C50411 Malignant neoplasm of upper-outer quadrant of right female breast: Secondary | ICD-10-CM | POA: Insufficient documentation

## 2018-07-31 DIAGNOSIS — Z5112 Encounter for antineoplastic immunotherapy: Secondary | ICD-10-CM | POA: Diagnosis not present

## 2018-07-31 DIAGNOSIS — Z923 Personal history of irradiation: Secondary | ICD-10-CM | POA: Insufficient documentation

## 2018-07-31 DIAGNOSIS — Z171 Estrogen receptor negative status [ER-]: Secondary | ICD-10-CM | POA: Insufficient documentation

## 2018-07-31 DIAGNOSIS — Z79899 Other long term (current) drug therapy: Secondary | ICD-10-CM | POA: Insufficient documentation

## 2018-07-31 DIAGNOSIS — Z95828 Presence of other vascular implants and grafts: Secondary | ICD-10-CM

## 2018-07-31 DIAGNOSIS — Z9221 Personal history of antineoplastic chemotherapy: Secondary | ICD-10-CM | POA: Insufficient documentation

## 2018-07-31 LAB — CMP (CANCER CENTER ONLY)
ALT: 20 U/L (ref 0–44)
AST: 16 U/L (ref 15–41)
Albumin: 3.9 g/dL (ref 3.5–5.0)
Alkaline Phosphatase: 78 U/L (ref 38–126)
Anion gap: 11 (ref 5–15)
BUN: 19 mg/dL (ref 6–20)
CO2: 27 mmol/L (ref 22–32)
Calcium: 9.5 mg/dL (ref 8.9–10.3)
Chloride: 103 mmol/L (ref 98–111)
Creatinine: 0.85 mg/dL (ref 0.44–1.00)
GFR, Est AFR Am: >60 mL/min (ref >60)
GFR, Estimated: >60 mL/min (ref >60)
Glucose, Bld: 104 mg/dL — ABNORMAL HIGH (ref 70–99)
Potassium: 4.6 mmol/L (ref 3.5–5.1)
Sodium: 141 mmol/L (ref 135–145)
Total Bilirubin: 0.3 mg/dL (ref 0.3–1.2)
Total Protein: 7.1 g/dL (ref 6.5–8.1)

## 2018-07-31 LAB — CBC WITH DIFFERENTIAL (CANCER CENTER ONLY)
Abs Immature Granulocytes: 0.01 10*3/uL (ref 0.00–0.07)
Basophils Absolute: 0 10*3/uL (ref 0.0–0.1)
Basophils Relative: 1 %
Eosinophils Absolute: 0.1 10*3/uL (ref 0.0–0.5)
Eosinophils Relative: 1 %
HCT: 37.5 % (ref 36.0–46.0)
Hemoglobin: 11.5 g/dL — ABNORMAL LOW (ref 12.0–15.0)
Immature Granulocytes: 0 %
Lymphocytes Relative: 21 %
Lymphs Abs: 1.4 10*3/uL (ref 0.7–4.0)
MCH: 25 pg — ABNORMAL LOW (ref 26.0–34.0)
MCHC: 30.7 g/dL (ref 30.0–36.0)
MCV: 81.5 fL (ref 80.0–100.0)
Monocytes Absolute: 0.5 10*3/uL (ref 0.1–1.0)
Monocytes Relative: 8 %
Neutro Abs: 4.4 10*3/uL (ref 1.7–7.7)
Neutrophils Relative %: 69 %
Platelet Count: 266 10*3/uL (ref 150–400)
RBC: 4.6 MIL/uL (ref 3.87–5.11)
RDW: 17.1 % — ABNORMAL HIGH (ref 11.5–15.5)
WBC Count: 6.5 10*3/uL (ref 4.0–10.5)
nRBC: 0 % (ref 0.0–0.2)

## 2018-07-31 MED ORDER — DIPHENHYDRAMINE HCL 25 MG PO CAPS
ORAL_CAPSULE | ORAL | Status: AC
  Filled 2018-07-31: qty 2

## 2018-07-31 MED ORDER — SODIUM CHLORIDE 0.9% FLUSH
10.0000 mL | INTRAVENOUS | Status: DC | PRN
  Administered 2018-07-31: 10 mL
  Filled 2018-07-31: qty 10

## 2018-07-31 MED ORDER — SODIUM CHLORIDE 0.9 % IV SOLN
Freq: Once | INTRAVENOUS | Status: AC
  Administered 2018-07-31: 09:00:00 via INTRAVENOUS
  Filled 2018-07-31: qty 250

## 2018-07-31 MED ORDER — ACETAMINOPHEN 325 MG PO TABS
ORAL_TABLET | ORAL | Status: AC
  Filled 2018-07-31: qty 2

## 2018-07-31 MED ORDER — HEPARIN SOD (PORK) LOCK FLUSH 100 UNIT/ML IV SOLN
500.0000 [IU] | Freq: Once | INTRAVENOUS | Status: AC | PRN
  Administered 2018-07-31: 500 [IU]
  Filled 2018-07-31: qty 5

## 2018-07-31 MED ORDER — DIPHENHYDRAMINE HCL 25 MG PO CAPS
50.0000 mg | ORAL_CAPSULE | Freq: Once | ORAL | Status: AC
  Administered 2018-07-31: 50 mg via ORAL

## 2018-07-31 MED ORDER — TRASTUZUMAB CHEMO 150 MG IV SOLR
600.0000 mg | Freq: Once | INTRAVENOUS | Status: AC
  Administered 2018-07-31: 600 mg via INTRAVENOUS
  Filled 2018-07-31: qty 28.57

## 2018-07-31 MED ORDER — ACETAMINOPHEN 325 MG PO TABS
650.0000 mg | ORAL_TABLET | Freq: Once | ORAL | Status: AC
  Administered 2018-07-31: 650 mg via ORAL

## 2018-07-31 NOTE — Patient Instructions (Signed)
Coronavirus (COVID-19) Are you at risk?  Are you at risk for the Coronavirus (COVID-19)?  To be considered HIGH RISK for Coronavirus (COVID-19), you have to meet the following criteria:  . Traveled to [place], [place], [place], [place] or [place]; or in the [place] to [place], [place], [place], or [place]; and have fever, cough, and shortness of breath within the last 2 weeks of travel OR . Been in close contact with a person diagnosed with COVID-19 within the last 2 weeks and have fever, cough, and shortness of breath . IF YOU DO NOT MEET THESE CRITERIA, YOU ARE CONSIDERED LOW RISK FOR COVID-19.  What to do if you are HIGH RISK for COVID-19?  . If you are having a medical emergency, call 911. . Seek medical care right away. Before you go to a doctor's office, urgent care or emergency department, call ahead and tell them about your recent travel, contact with someone diagnosed with COVID-19, and your symptoms. You should receive instructions from your physician's office regarding next steps of care.  . When you arrive at healthcare provider, tell the healthcare staff immediately you have returned from visiting [place], [place], [place], [place] or [place]; or traveled in the [place] to [place], [place], [place], or [place]; in the last two weeks or you have been in close contact with a person diagnosed with COVID-19 in the last 2 weeks.   . Tell the health care staff about your symptoms: fever, cough and shortness of breath. . After you have been seen by a medical provider, you will be either: o Tested for (COVID-19) and discharged home on quarantine except to seek medical care if symptoms worsen, and asked to  - Stay home and avoid contact with others until you get your results (4-5 days)  - Avoid travel on public transportation if possible (such as bus, train, or airplane) or o Sent to the Emergency Department by EMS for evaluation, COVID-19 testing, and possible  admission depending on your condition and test results.  What to do if you are LOW RISK for COVID-19?  Reduce your risk of any infection by using the same precautions used for avoiding the common cold or flu:  . Wash your hands often with soap and warm water for at least 20 seconds.  If soap and water are not readily available, use an alcohol-based hand sanitizer with at least 60% alcohol.  . If coughing or sneezing, cover your mouth and nose by coughing or sneezing into the elbow areas of your shirt or coat, into a tissue or into your sleeve (not your hands). . Avoid shaking hands with others and consider head nods or verbal greetings only. . Avoid touching your eyes, nose, or mouth with unwashed hands.  . Avoid close contact with people who are sick. . Avoid places or events with large numbers of people in one location, like concerts or sporting events. . Carefully consider travel plans you have or are making. . If you are planning any travel outside or inside the [place], visit the CDC's Travelers' Health webpage for the latest health notices. . If you have some symptoms but not all symptoms, continue to monitor at home and seek medical attention if your symptoms worsen. . If you are having a medical emergency, call 911.   ADDITIONAL HEALTHCARE OPTIONS FOR PATIENTS  [facility] / e-Visit: [email or web address]         [facility]: [phone]  Onset   Urgent Care: [facility]: [place] Discharge Instructions for Patients Receiving Chemotherapy  Today you received the following chemotherapy agents :  Herceptin.  To help prevent nausea and vomiting after your treatment, we encourage you to take your nausea medication as prescribed.   If you develop nausea and vomiting that is not controlled by your nausea medication, call the clinic.    BELOW ARE SYMPTOMS THAT SHOULD BE REPORTED IMMEDIATELY:  *FEVER GREATER THAN 100.5 F  *CHILLS WITH OR WITHOUT FEVER  NAUSEA AND VOMITING THAT IS NOT CONTROLLED WITH YOUR NAUSEA MEDICATION  *UNUSUAL SHORTNESS OF BREATH  *UNUSUAL BRUISING OR BLEEDING  TENDERNESS IN MOUTH AND THROAT WITH OR WITHOUT PRESENCE OF ULCERS  *URINARY PROBLEMS  *BOWEL PROBLEMS  UNUSUAL RASH Items with * indicate a potential emergency and should be followed up as soon as possible.  Feel free to call the clinic should you have any questions or concerns. The clinic phone number is [phone].  Please show the [place] at check-in to the Emergency Department and triage nurse.

## 2018-08-04 ENCOUNTER — Other Ambulatory Visit: Payer: BLUE CROSS/BLUE SHIELD

## 2018-08-04 ENCOUNTER — Ambulatory Visit: Payer: BLUE CROSS/BLUE SHIELD

## 2018-08-06 NOTE — Assessment & Plan Note (Signed)
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 Neoadjuvant chemotherapy with TCH Perjeta 6 cycles followed by Herceptinand Perjetamaintenance for 1 year 2. Followed bybilateral mastectomies with targeted node dissection on the right7/23/2019 3.Due to significant residual disease, additional adjuvant chemo with Adriamycin and Cytoxan 12/18/2017-01/27/2018 4. Followed by adjuvant radiation therapy ----------------------------------------------------------------------------- 11/18/2017:Bilateral mastectomies: Right mastectomy: IDC grade 3 3.8 cm margins negative, 0/4 lymph nodes negative, ER 0%, PR 0%, HER-2 negative ratio 1.3, IHC HER-2 negative; Ki-67 70%, RCB class II; left mastectomy: [facility]  Current treatment: Herceptin Maintenance (Perjeta discontinued for diarrhea) Echo 05/15/2018 shows EF of 50-55% (followed by Pierre Bali MD)  Toxicities: 1.  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  Fatigue  See significant emotional stressors related to her family situation. I offered her counseling but she refused. Return to clinic every 3 weeks for Herceptin every 6 weeks for follow-up with [place]

## 2018-08-14 ENCOUNTER — Other Ambulatory Visit: Payer: Self-pay | Admitting: Hematology and Oncology

## 2018-08-17 ENCOUNTER — Other Ambulatory Visit: Payer: Self-pay | Admitting: *Deleted

## 2018-08-17 ENCOUNTER — Telehealth: Payer: Self-pay | Admitting: Hematology and Oncology

## 2018-08-17 DIAGNOSIS — Z171 Estrogen receptor negative status [ER-]: Principal | ICD-10-CM

## 2018-08-17 DIAGNOSIS — C50411 Malignant neoplasm of upper-outer quadrant of right female breast: Secondary | ICD-10-CM

## 2018-08-17 NOTE — Progress Notes (Signed)
Patient Care Team: Deland Pretty, MD as PCP - General (Internal Medicine) Baxley, Cresenciano Lick, MD (Internal Medicine)  DIAGNOSIS:    ICD-10-CM   1. Malignant neoplasm of upper-outer quadrant of right breast in female, estrogen receptor negative ([place]) C50.411    Z17.1     SUMMARY OF ONCOLOGIC HISTORY:   Malignant neoplasm of upper-outer quadrant of right breast in female, estrogen receptor negative ([place])   07/09/2017 Initial Diagnosis    Patient palpated a right breast mass which was evaluated by mammogram and ultrasound and a breast MRI which revealed 4.5 x 4 cm necrotic tumor with suspicious multiple right axillary lymph nodes; biopsy revealed IDC grade 3 ER 0%, PR 0%, HER-2 positive ratio 3.08, Ki-67 70%, T2NX stage IIa/IIb    07/18/2017 - 11/04/2017 Neo-Adjuvant Chemotherapy    TCH Perjeta x6 cycles followed by Herceptin Perjeta maintenance    08/15/2017 Genetic Testing    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  The Multi-Gene Panel offered by [facility] includes sequencing and/or deletion duplication testing of the following 83 genes: ALK, APC, ATM, AXIN2,BAP1,  BARD1, BLM, BMPR1A, BRCA1, BRCA2, BRIP1, CASR, CDC73, CDH1, CDK4, CDKN1B, CDKN1C, CDKN2A (p14ARF), CDKN2A (p16INK4a), CEBPA, CHEK2, CTNNA1, DICER1, DIS3L2, EGFR (c.2369C>T, p.Thr790Met variant only), EPCAM (Deletion/duplication testing only), FH, FLCN, GATA2, GPC3, GREM1 (Promoter region deletion/duplication testing only), HOXB13 (c.251G>A, p.Gly84Glu), HRAS, KIT, MAX, MEN1, MET, MITF (c.952G>A, p.Glu318Lys variant only), MLH1, MSH2, MSH3, MSH6, MUTYH, NBN, NF1, NF2, NTHL1, PALB2, PDGFRA, PHOX2B, PMS2, POLD1, POLE, POT1, PRKAR1A, PTCH1, PTEN, RAD50, RAD51C, RAD51D, RB1, RECQL4, RET, RUNX1, SDHAF2, SDHA (sequence changes only), SDHB, SDHC, SDHD, SMAD4, SMARCA4, SMARCB1, SMARCE1, STK11, SUFU, TERT, TERT, TMEM127, TP53, TSC1, TSC2, VHL, WRN and WT1.  The report date is August 15, 2017.    11/18/2017 Surgery   Bilateral mastectomies: Right mastectomy: IDC grade 3 3.8 cm margins negative, 0/4 lymph nodes negative, ER 0%, PR 0%, HER-2 negative ratio 1.3, IHC HER-2 negative; Ki-67 70%, RCB class II; left mastectomy: [facility]    12/18/2017 - 01/27/2018 Chemotherapy    Adjuvant chemotherapy with dose dense Adriamycin and Cytoxan x4    02/17/2018 -  Chemotherapy    Maintenance Herceptin and Perjeta    02/24/2018 Surgery    REMOVAL OF BILATERAL TISSUE EXPANDERS WITH PLACEMENT OF BILATERAL BREAST IMPLANTS and LIPOFILLING FROM ABDOMEN TO BILATERAL CHEST by Dr. Iran Planas      CHIEF COMPLIANT: Follow-up of maintenance Herceptin  INTERVAL HISTORY: Rebecca [name] is a 48 y.o. with above-mentioned history of HER-2 positive breast cancer who iscurrentlyreceivingHerceptin maintenance. She presents to the clinic alone today for treatment.  She has mild stomach upset recently.  REVIEW OF SYSTEMS:   Constitutional: Denies fevers, chills or abnormal weight loss Eyes: Denies blurriness of vision Ears, nose, mouth, throat, and face: Denies mucositis or sore throat Respiratory: Denies cough, dyspnea or wheezes Cardiovascular: Denies palpitation, chest discomfort Gastrointestinal: Denies nausea, heartburn or change in bowel habits Skin: Denies abnormal skin rashes Lymphatics: Denies new lymphadenopathy or easy bruising Neurological: Denies numbness, tingling or new weaknesses Behavioral/Psych: Mood is stable, no new changes  Extremities: No lower extremity edema Breast: denies any pain or lumps or nodules in either breasts All other systems were reviewed with the patient and are negative.  I have reviewed the past medical history, past surgical history, social history and family history with the patient and they are unchanged from previous note.  ALLERGIES:  has No Known Allergies.  MEDICATIONS:  Current Outpatient Medications  Medication Sig Dispense Refill  .  carvedilol (COREG) 3.125 MG tablet TAKE 1  TABLET TWICE A DAY 180 tablet 1  . diphenoxylate-atropine (LOMOTIL) 2.5-0.025 MG tablet Take 1 tablet by mouth 4 (four) times daily as needed for diarrhea or loose stools. 30 tablet 2  . esomeprazole (NEXIUM) 40 MG capsule TAKE 1 CAPSULE (40 MG TOTAL) BY MOUTH DAILY AT 12 NOON. 90 capsule 2  . levothyroxine (SYNTHROID, LEVOTHROID) 137 MCG tablet Take 137 mcg by mouth daily before breakfast.    . LORazepam (ATIVAN) 1 MG tablet Take 1 tablet (1 mg total) by mouth every 8 (eight) hours as needed (nausea and vomiting). 30 tablet 0  . losartan (COZAAR) 25 MG tablet TAKE 1 TABLET BY MOUTH EVERY DAY 90 tablet 1  . metFORMIN (GLUCOPHAGE) 500 MG tablet Take by mouth daily with breakfast.    . ondansetron (ZOFRAN ODT) 4 MG disintegrating tablet Take 1 tablet (4 mg total) by mouth every 8 (eight) hours as needed for nausea or vomiting. 20 tablet 3  . rosuvastatin (CRESTOR) 20 MG tablet Take 1 tablet (20 mg total) by mouth daily. 90 tablet 3  . triamterene-hydrochlorothiazide (MAXZIDE) 75-50 MG tablet TAKE 1 TABLET BY MOUTH DAILY FOR EDEMA 90 tablet 0   No current facility-administered medications for this visit.    Facility-Administered Medications Ordered in Other Visits  Medication Dose Route Frequency Provider Last Rate Last Dose  . heparin lock flush 100 unit/mL  500 Units Intracatheter Once PRN Nicholas Lose, MD      . LORazepam (ATIVAN) injection 1 mg  1 mg Intravenous Once Sandi Mealy E., PA-C      . sodium chloride flush (NS) 0.9 % injection 10 mL  10 mL Intracatheter PRN Nicholas Lose, MD      . trastuzumab (HERCEPTIN) 600 mg in sodium chloride 0.9 % 250 mL chemo infusion  600 mg Intravenous Once Nicholas Lose, MD        PHYSICAL EXAMINATION: ECOG PERFORMANCE STATUS: 1 - Symptomatic but completely ambulatory  Vitals:   08/18/18 0912  BP: 131/90  Pulse: 86  Resp: 17  Temp: 98.2 F (36.8 C)  SpO2: 99%   Filed Weights   08/18/18 0912  Weight: 235 lb 12.8 oz (107 kg)    GENERAL:  alert, no distress and comfortable SKIN: skin color, texture, turgor are normal, no rashes or significant lesions EYES: normal, Conjunctiva are pink and non-injected, sclera clear OROPHARYNX: no exudate, no erythema and lips, buccal mucosa, and tongue normal  NECK: supple, thyroid normal size, non-tender, without nodularity LYMPH: no palpable lymphadenopathy in the cervical, axillary or inguinal LUNGS: clear to auscultation and percussion with normal breathing effort HEART: regular rate & rhythm and no murmurs and no lower extremity edema ABDOMEN: abdomen soft, non-tender and normal bowel sounds MUSCULOSKELETAL: no cyanosis of digits and no clubbing  NEURO: alert & oriented x 3 with fluent speech, no focal motor/sensory deficits EXTREMITIES: No lower extremity edema  LABORATORY DATA:  I have reviewed the data as listed CMP Latest Ref Rng & Units 08/18/2018 07/31/2018 07/14/2018  Glucose 70 - 99 mg/dL 105(H) 104(H) 113(H)  BUN 6 - 20 mg/dL 15 19 14   Creatinine 0.44 - 1.00 mg/dL 0.83 0.85 0.94  Sodium 135 - 145 mmol/L 141 141 143  Potassium 3.5 - 5.1 mmol/L 4.6 4.6 4.6  Chloride 98 - 111 mmol/L 104 103 107  CO2 22 - 32 mmol/L 27 27 26   Calcium 8.9 - 10.3 mg/dL 9.2 9.5 9.6  Total Protein 6.5 - 8.1 g/dL 6.8 7.1  7.0  Total Bilirubin 0.3 - 1.2 mg/dL 0.3 0.3 0.6  Alkaline Phos 38 - 126 U/L 75 78 71  AST 15 - 41 U/L 14(L) 16 22  ALT 0 - 44 U/L 21 20 25     Lab Results  Component Value Date   WBC 8.4 08/18/2018   HGB 11.5 (L) 08/18/2018   HCT 37.4 08/18/2018   MCV 83.1 08/18/2018   PLT 244 08/18/2018   NEUTROABS 6.3 08/18/2018    ASSESSMENT & PLAN:  Malignant neoplasm of upper-outer quadrant of right breast in female, estrogen receptor negative ([place]) 07/09/2017:Patient palpated a right breast mass which was evaluated by mammogram and ultrasound and a breast MRI which revealed 4.5 x 4 cm necrotic tumor with suspicious multiple right axillary lymph nodes; biopsy revealed IDC grade 3 ER 0%,  PR 0%, HER-2 positive ratio 3.08, Ki-67 70%, T2NX stage IIa/IIb  Treatment planbased on multidisciplinary tumor board: 1. Neoadjuvant chemotherapy with TCH Perjeta 6 cycles followed by Herceptinand Perjetamaintenance for 1 year 2. Followed bybilateral mastectomies with targeted node dissection on the right7/23/2019 3.Due to significant residual disease, additional adjuvant chemo with Adriamycin and Cytoxan 12/18/2017-01/27/2018 4. Followed by adjuvant radiation therapy ----------------------------------------------------------------------------- 11/18/2017:Bilateral mastectomies: Right mastectomy: IDC grade 3 3.8 cm margins negative, 0/4 lymph nodes negative, ER 0%, PR 0%, HER-2 negative ratio 1.3, IHC HER-2 negative; Ki-67 70%, RCB class II; left mastectomy: [facility]  Current treatment: Herceptin Maintenance (Perjeta discontinued for diarrhea) Echo 05/15/2018 shows EF of 50-55% (followed by Pierre Bali MD)  Toxicities: 1.  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  Fatigue  significant emotional stressors related to her family situation.   Return to clinic every 3 weeks for Herceptin every 6 weeks for follow-up with [place] She is very excited that she has only 3 more doses of Herceptin left. She will need another echocardiogram.   No orders of the defined types were placed in this encounter.  The patient has a good understanding of the overall plan. she agrees with it. she will call with any problems that may develop before the next visit here.  Nicholas Lose, MD 08/18/2018  Julious Oka Dorshimer am acting as scribe for Dr. Nicholas Lose.  I have reviewed the above documentation for accuracy and completeness, and I agree with the above.

## 2018-08-17 NOTE — Telephone Encounter (Signed)
Scheduled appts for 6/23 per sch msg. Mailed printout

## 2018-08-18 ENCOUNTER — Inpatient Hospital Stay (HOSPITAL_BASED_OUTPATIENT_CLINIC_OR_DEPARTMENT_OTHER): Payer: BLUE CROSS/BLUE SHIELD | Admitting: Hematology and Oncology

## 2018-08-18 ENCOUNTER — Inpatient Hospital Stay: Payer: BLUE CROSS/BLUE SHIELD

## 2018-08-18 ENCOUNTER — Other Ambulatory Visit: Payer: Self-pay

## 2018-08-18 DIAGNOSIS — Z95828 Presence of other vascular implants and grafts: Secondary | ICD-10-CM

## 2018-08-18 DIAGNOSIS — Z171 Estrogen receptor negative status [ER-]: Principal | ICD-10-CM

## 2018-08-18 DIAGNOSIS — C50411 Malignant neoplasm of upper-outer quadrant of right female breast: Secondary | ICD-10-CM

## 2018-08-18 DIAGNOSIS — Z923 Personal history of irradiation: Secondary | ICD-10-CM

## 2018-08-18 DIAGNOSIS — Z9221 Personal history of antineoplastic chemotherapy: Secondary | ICD-10-CM

## 2018-08-18 DIAGNOSIS — Z9013 Acquired absence of bilateral breasts and nipples: Secondary | ICD-10-CM

## 2018-08-18 DIAGNOSIS — Z79899 Other long term (current) drug therapy: Secondary | ICD-10-CM

## 2018-08-18 LAB — CMP (CANCER CENTER ONLY)
ALT: 21 U/L (ref 0–44)
AST: 14 U/L — ABNORMAL LOW (ref 15–41)
Albumin: 3.7 g/dL (ref 3.5–5.0)
Alkaline Phosphatase: 75 U/L (ref 38–126)
Anion gap: 10 (ref 5–15)
BUN: 15 mg/dL (ref 6–20)
CO2: 27 mmol/L (ref 22–32)
Calcium: 9.2 mg/dL (ref 8.9–10.3)
Chloride: 104 mmol/L (ref 98–111)
Creatinine: 0.83 mg/dL (ref 0.44–1.00)
GFR, Est AFR Am: >60 mL/min (ref >60)
GFR, Estimated: >60 mL/min (ref >60)
Glucose, Bld: 105 mg/dL — ABNORMAL HIGH (ref 70–99)
Potassium: 4.6 mmol/L (ref 3.5–5.1)
Sodium: 141 mmol/L (ref 135–145)
Total Bilirubin: 0.3 mg/dL (ref 0.3–1.2)
Total Protein: 6.8 g/dL (ref 6.5–8.1)

## 2018-08-18 LAB — CBC WITH DIFFERENTIAL (CANCER CENTER ONLY)
Abs Immature Granulocytes: 0.03 10*3/uL (ref 0.00–0.07)
Basophils Absolute: 0 10*3/uL (ref 0.0–0.1)
Basophils Relative: 0 %
Eosinophils Absolute: 0.1 10*3/uL (ref 0.0–0.5)
Eosinophils Relative: 1 %
HCT: 37.4 % (ref 36.0–46.0)
Hemoglobin: 11.5 g/dL — ABNORMAL LOW (ref 12.0–15.0)
Immature Granulocytes: 0 %
Lymphocytes Relative: 18 %
Lymphs Abs: 1.5 10*3/uL (ref 0.7–4.0)
MCH: 25.6 pg — ABNORMAL LOW (ref 26.0–34.0)
MCHC: 30.7 g/dL (ref 30.0–36.0)
MCV: 83.1 fL (ref 80.0–100.0)
Monocytes Absolute: 0.5 10*3/uL (ref 0.1–1.0)
Monocytes Relative: 6 %
Neutro Abs: 6.3 10*3/uL (ref 1.7–7.7)
Neutrophils Relative %: 75 %
Platelet Count: 244 10*3/uL (ref 150–400)
RBC: 4.5 MIL/uL (ref 3.87–5.11)
RDW: 16.5 % — ABNORMAL HIGH (ref 11.5–15.5)
WBC Count: 8.4 10*3/uL (ref 4.0–10.5)
nRBC: 0 % (ref 0.0–0.2)

## 2018-08-18 MED ORDER — SODIUM CHLORIDE 0.9% FLUSH
10.0000 mL | INTRAVENOUS | Status: DC | PRN
  Administered 2018-08-18: 10 mL
  Filled 2018-08-18: qty 10

## 2018-08-18 MED ORDER — SODIUM CHLORIDE 0.9 % IV SOLN
Freq: Once | INTRAVENOUS | Status: AC
  Administered 2018-08-18: 10:00:00 via INTRAVENOUS
  Filled 2018-08-18: qty 250

## 2018-08-18 MED ORDER — TRASTUZUMAB CHEMO 150 MG IV SOLR
600.0000 mg | Freq: Once | INTRAVENOUS | Status: AC
  Administered 2018-08-18: 600 mg via INTRAVENOUS
  Filled 2018-08-18: qty 28.57

## 2018-08-18 MED ORDER — DIPHENHYDRAMINE HCL 25 MG PO CAPS
50.0000 mg | ORAL_CAPSULE | Freq: Once | ORAL | Status: AC
  Administered 2018-08-18: 50 mg via ORAL

## 2018-08-18 MED ORDER — ACETAMINOPHEN 325 MG PO TABS
650.0000 mg | ORAL_TABLET | Freq: Once | ORAL | Status: AC
  Administered 2018-08-18: 10:00:00 650 mg via ORAL

## 2018-08-18 MED ORDER — HEPARIN SOD (PORK) LOCK FLUSH 100 UNIT/ML IV SOLN
500.0000 [IU] | Freq: Once | INTRAVENOUS | Status: AC | PRN
  Administered 2018-08-18: 12:00:00 500 [IU]
  Filled 2018-08-18: qty 5

## 2018-08-18 MED ORDER — ACETAMINOPHEN 325 MG PO TABS
ORAL_TABLET | ORAL | Status: AC
  Filled 2018-08-18: qty 2

## 2018-08-18 MED ORDER — ONDANSETRON 4 MG PO TBDP: 4.0000 mg | ORAL_TABLET | Freq: Three times a day (TID) | ORAL | 3 refills | Status: DC | PRN

## 2018-08-18 MED ORDER — DIPHENHYDRAMINE HCL 25 MG PO CAPS
ORAL_CAPSULE | ORAL | Status: AC
  Filled 2018-08-18: qty 2

## 2018-08-18 MED ORDER — SODIUM CHLORIDE 0.9% FLUSH
10.0000 mL | INTRAVENOUS | Status: DC | PRN
  Administered 2018-08-18: 12:00:00 10 mL
  Filled 2018-08-18: qty 10

## 2018-08-18 NOTE — Patient Instructions (Signed)

## 2018-08-18 NOTE — Progress Notes (Signed)
Per Dr. Lindi Adie, ok to treat using echo results from 05/15/2018. Repeat echo scheduled for 08/20/2018.

## 2018-08-18 NOTE — Patient Instructions (Signed)
[facility] Discharge Instructions for Patients Receiving Chemotherapy  Today you received the following chemotherapy agents Herceptin  To help prevent nausea and vomiting after your treatment, we encourage you to take your nausea medication as directed   If you develop nausea and vomiting that is not controlled by your nausea medication, call the clinic.   BELOW ARE SYMPTOMS THAT SHOULD BE REPORTED IMMEDIATELY:  *FEVER GREATER THAN 100.5 F  *CHILLS WITH OR WITHOUT FEVER  NAUSEA AND VOMITING THAT IS NOT CONTROLLED WITH YOUR NAUSEA MEDICATION  *UNUSUAL SHORTNESS OF BREATH  *UNUSUAL BRUISING OR BLEEDING  TENDERNESS IN MOUTH AND THROAT WITH OR WITHOUT PRESENCE OF ULCERS  *URINARY PROBLEMS  *BOWEL PROBLEMS  UNUSUAL RASH Items with * indicate a potential emergency and should be followed up as soon as possible.  Feel free to call the clinic should you have any questions or concerns. The clinic phone number is [phone].  Please show the CHEMO ALERT CARD at check-in to the Emergency Department and triage nurse.   

## 2018-08-19 ENCOUNTER — Encounter: Payer: Self-pay | Admitting: *Deleted

## 2018-08-19 LAB — THYROID PANEL WITH TSH
Free Thyroxine Index: 2.9 (ref 1.2–4.9)
T3 Uptake Ratio: 27 % (ref 24–39)
T4, Total: 10.9 ug/dL (ref 4.5–12.0)
TSH: 0.302 u[IU]/mL — ABNORMAL LOW (ref 0.450–4.500)

## 2018-08-19 NOTE — Progress Notes (Signed)
Faxed TSH results to Dr. Chalmers Cater office [phone])

## 2018-08-20 ENCOUNTER — Other Ambulatory Visit (HOSPITAL_COMMUNITY): Payer: BLUE CROSS/BLUE SHIELD

## 2018-08-20 ENCOUNTER — Other Ambulatory Visit: Payer: Self-pay

## 2018-08-20 ENCOUNTER — Ambulatory Visit (HOSPITAL_COMMUNITY)
Admission: RE | Admit: 2018-08-20 | Discharge: 2018-08-20 | Disposition: A | Discharge status: Home / Self Care | Payer: BLUE CROSS/BLUE SHIELD | Source: Ambulatory Visit | Attending: Internal Medicine | Admitting: Internal Medicine

## 2018-08-20 ENCOUNTER — Encounter (HOSPITAL_COMMUNITY): Payer: BLUE CROSS/BLUE SHIELD | Admitting: Internal Medicine

## 2018-08-20 DIAGNOSIS — C50411 Malignant neoplasm of upper-outer quadrant of right female breast: Secondary | ICD-10-CM

## 2018-08-20 DIAGNOSIS — K219 Gastro-esophageal reflux disease without esophagitis: Secondary | ICD-10-CM | POA: Diagnosis not present

## 2018-08-20 DIAGNOSIS — I447 Left bundle-branch block, unspecified: Secondary | ICD-10-CM | POA: Diagnosis not present

## 2018-08-20 DIAGNOSIS — Z171 Estrogen receptor negative status [ER-]: Secondary | ICD-10-CM | POA: Insufficient documentation

## 2018-08-20 DIAGNOSIS — E119 Type 2 diabetes mellitus without complications: Secondary | ICD-10-CM | POA: Diagnosis not present

## 2018-08-20 NOTE — Progress Notes (Signed)
  Echocardiogram 2D Echocardiogram has been performed.  Rebecca [name] M 08/20/2018, 12:19 PM

## 2018-09-08 ENCOUNTER — Other Ambulatory Visit: Payer: Self-pay

## 2018-09-08 ENCOUNTER — Inpatient Hospital Stay: Payer: BLUE CROSS/BLUE SHIELD

## 2018-09-08 ENCOUNTER — Inpatient Hospital Stay: Payer: BLUE CROSS/BLUE SHIELD | Attending: Hematology and Oncology

## 2018-09-08 VITALS — BP 121/87 | HR 82 | Temp 98.6°F | Resp 18

## 2018-09-08 DIAGNOSIS — Z171 Estrogen receptor negative status [ER-]: Secondary | ICD-10-CM

## 2018-09-08 DIAGNOSIS — Z9013 Acquired absence of bilateral breasts and nipples: Secondary | ICD-10-CM | POA: Insufficient documentation

## 2018-09-08 DIAGNOSIS — Z79899 Other long term (current) drug therapy: Secondary | ICD-10-CM | POA: Insufficient documentation

## 2018-09-08 DIAGNOSIS — Z9221 Personal history of antineoplastic chemotherapy: Secondary | ICD-10-CM | POA: Insufficient documentation

## 2018-09-08 DIAGNOSIS — Z5112 Encounter for antineoplastic immunotherapy: Secondary | ICD-10-CM | POA: Diagnosis not present

## 2018-09-08 DIAGNOSIS — Z923 Personal history of irradiation: Secondary | ICD-10-CM | POA: Diagnosis not present

## 2018-09-08 DIAGNOSIS — Z95828 Presence of other vascular implants and grafts: Secondary | ICD-10-CM

## 2018-09-08 DIAGNOSIS — C50411 Malignant neoplasm of upper-outer quadrant of right female breast: Secondary | ICD-10-CM | POA: Insufficient documentation

## 2018-09-08 LAB — CMP (CANCER CENTER ONLY)
ALT: 22 U/L (ref 0–44)
AST: 15 U/L (ref 15–41)
Albumin: 3.7 g/dL (ref 3.5–5.0)
Alkaline Phosphatase: 79 U/L (ref 38–126)
Anion gap: 10 (ref 5–15)
BUN: 18 mg/dL (ref 6–20)
CO2: 25 mmol/L (ref 22–32)
Calcium: 8.7 mg/dL — ABNORMAL LOW (ref 8.9–10.3)
Chloride: 105 mmol/L (ref 98–111)
Creatinine: 0.85 mg/dL (ref 0.44–1.00)
GFR, Est AFR Am: >60 mL/min (ref >60)
GFR, Estimated: >60 mL/min (ref >60)
Glucose, Bld: 123 mg/dL — ABNORMAL HIGH (ref 70–99)
Potassium: 4.4 mmol/L (ref 3.5–5.1)
Sodium: 140 mmol/L (ref 135–145)
Total Bilirubin: 0.3 mg/dL (ref 0.3–1.2)
Total Protein: 6.8 g/dL (ref 6.5–8.1)

## 2018-09-08 LAB — CBC WITH DIFFERENTIAL (CANCER CENTER ONLY)
Abs Immature Granulocytes: 0.01 10*3/uL (ref 0.00–0.07)
Basophils Absolute: 0 10*3/uL (ref 0.0–0.1)
Basophils Relative: 1 %
Eosinophils Absolute: 0.1 10*3/uL (ref 0.0–0.5)
Eosinophils Relative: 1 %
HCT: 37.3 % (ref 36.0–46.0)
Hemoglobin: 11.4 g/dL — ABNORMAL LOW (ref 12.0–15.0)
Immature Granulocytes: 0 %
Lymphocytes Relative: 18 %
Lymphs Abs: 1.2 10*3/uL (ref 0.7–4.0)
MCH: 24.9 pg — ABNORMAL LOW (ref 26.0–34.0)
MCHC: 30.6 g/dL (ref 30.0–36.0)
MCV: 81.4 fL (ref 80.0–100.0)
Monocytes Absolute: 0.6 10*3/uL (ref 0.1–1.0)
Monocytes Relative: 9 %
Neutro Abs: 4.6 10*3/uL (ref 1.7–7.7)
Neutrophils Relative %: 71 %
Platelet Count: 267 10*3/uL (ref 150–400)
RBC: 4.58 MIL/uL (ref 3.87–5.11)
RDW: 16 % — ABNORMAL HIGH (ref 11.5–15.5)
WBC Count: 6.4 10*3/uL (ref 4.0–10.5)
nRBC: 0 % (ref 0.0–0.2)

## 2018-09-08 MED ORDER — HEPARIN SOD (PORK) LOCK FLUSH 100 UNIT/ML IV SOLN
500.0000 [IU] | Freq: Once | INTRAVENOUS | Status: AC | PRN
  Administered 2018-09-08: 10:00:00 500 [IU]
  Filled 2018-09-08: qty 5

## 2018-09-08 MED ORDER — SODIUM CHLORIDE 0.9% FLUSH
10.0000 mL | INTRAVENOUS | Status: DC | PRN
  Administered 2018-09-08: 10 mL
  Filled 2018-09-08: qty 10

## 2018-09-08 MED ORDER — ACETAMINOPHEN 325 MG PO TABS
ORAL_TABLET | ORAL | Status: AC
  Filled 2018-09-08: qty 2

## 2018-09-08 MED ORDER — SODIUM CHLORIDE 0.9 % IV SOLN
Freq: Once | INTRAVENOUS | Status: AC
  Administered 2018-09-08: 09:00:00 via INTRAVENOUS
  Filled 2018-09-08: qty 250

## 2018-09-08 MED ORDER — DIPHENHYDRAMINE HCL 25 MG PO CAPS
ORAL_CAPSULE | ORAL | Status: AC
  Filled 2018-09-08: qty 2

## 2018-09-08 MED ORDER — TRASTUZUMAB CHEMO 150 MG IV SOLR
600.0000 mg | Freq: Once | INTRAVENOUS | Status: AC
  Administered 2018-09-08: 600 mg via INTRAVENOUS
  Filled 2018-09-08: qty 28.57

## 2018-09-08 MED ORDER — ACETAMINOPHEN 325 MG PO TABS
650.0000 mg | ORAL_TABLET | Freq: Once | ORAL | Status: AC
  Administered 2018-09-08: 09:00:00 650 mg via ORAL

## 2018-09-08 MED ORDER — DIPHENHYDRAMINE HCL 25 MG PO CAPS
50.0000 mg | ORAL_CAPSULE | Freq: Once | ORAL | Status: AC
  Administered 2018-09-08: 09:00:00 50 mg via ORAL

## 2018-09-08 NOTE — Progress Notes (Signed)
Dr. Lindi Adie notified of ECHO results from 4/23. OK to tx

## 2018-09-08 NOTE — Patient Instructions (Signed)
[facility] Discharge Instructions for Patients Receiving Chemotherapy  Today you received the following chemotherapy agents: Herceptin  To help prevent nausea and vomiting after your treatment, we encourage you to take your nausea medication as directed.   If you develop nausea and vomiting that is not controlled by your nausea medication, call the clinic.   BELOW ARE SYMPTOMS THAT SHOULD BE REPORTED IMMEDIATELY:  *FEVER GREATER THAN 100.5 F  *CHILLS WITH OR WITHOUT FEVER  NAUSEA AND VOMITING THAT IS NOT CONTROLLED WITH YOUR NAUSEA MEDICATION  *UNUSUAL SHORTNESS OF BREATH  *UNUSUAL BRUISING OR BLEEDING  TENDERNESS IN MOUTH AND THROAT WITH OR WITHOUT PRESENCE OF ULCERS  *URINARY PROBLEMS  *BOWEL PROBLEMS  UNUSUAL RASH Items with * indicate a potential emergency and should be followed up as soon as possible.  Feel free to call the clinic should you have any questions or concerns. The clinic phone number is [phone].  Please show the CHEMO ALERT CARD at check-in to the Emergency Department and triage nurse.  Coronavirus (COVID-19) Are you at risk?  Are you at risk for the Coronavirus (COVID-19)?  To be considered HIGH RISK for Coronavirus (COVID-19), you have to meet the following criteria:  . Traveled to [place], [place], [place], [place] or [place]; or in the [place] to [place], [place], [place], or [place]; and have fever, cough, and shortness of breath within the last 2 weeks of travel OR . Been in close contact with a person diagnosed with COVID-19 within the last 2 weeks and have fever, cough, and shortness of breath . IF YOU DO NOT MEET THESE CRITERIA, YOU ARE CONSIDERED LOW RISK FOR COVID-19.  What to do if you are HIGH RISK for COVID-19?  . If you are having a medical emergency, call 911. . Seek medical care right away. Before you go to a doctor's office, urgent care or emergency department, call ahead and tell them about your  recent travel, contact with someone diagnosed with COVID-19, and your symptoms. You should receive instructions from your physician's office regarding next steps of care.  . When you arrive at healthcare provider, tell the healthcare staff immediately you have returned from visiting [place], [place], [place], [place] or [place]; or traveled in the [place] to [place], [place], [place], or [place]; in the last two weeks or you have been in close contact with a person diagnosed with COVID-19 in the last 2 weeks.   . Tell the health care staff about your symptoms: fever, cough and shortness of breath. . After you have been seen by a medical provider, you will be either: o Tested for (COVID-19) and discharged home on quarantine except to seek medical care if symptoms worsen, and asked to  - Stay home and avoid contact with others until you get your results (4-5 days)  - Avoid travel on public transportation if possible (such as bus, train, or airplane) or o Sent to the Emergency Department by EMS for evaluation, COVID-19 testing, and possible admission depending on your condition and test results.  What to do if you are LOW RISK for COVID-19?  Reduce your risk of any infection by using the same precautions used for avoiding the common cold or flu:  . Wash your hands often with soap and warm water for at least 20 seconds.  If soap and water are not readily available, use an alcohol-based hand sanitizer with at least 60% alcohol.  . If coughing or sneezing,   cover your mouth and nose by coughing or sneezing into the elbow areas of your shirt or coat, into a tissue or into your sleeve (not your hands). . Avoid shaking hands with others and consider head nods or verbal greetings only. . Avoid touching your eyes, nose, or mouth with unwashed hands.  . Avoid close contact with people who are sick. . Avoid places or events with large numbers of people in one location, like concerts or sporting  events. . Carefully consider travel plans you have or are making. . If you are planning any travel outside or inside the [place], visit the CDC's Travelers' Health webpage for the latest health notices. . If you have some symptoms but not all symptoms, continue to monitor at home and seek medical attention if your symptoms worsen. . If you are having a medical emergency, call 911.   ADDITIONAL HEALTHCARE OPTIONS FOR PATIENTS  [facility] / e-Visit: [email or web address]         [facility]: [phone]  [facility]: [phone]                   [facility]: [phone]     

## 2018-09-22 NOTE — Assessment & Plan Note (Signed)
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 Neoadjuvant chemotherapy with TCH Perjeta 6 cycles followed by Herceptinand Perjetamaintenance for 1 year 2. Followed bybilateral mastectomies with targeted node dissection on the right7/23/2019 3.Due to significant residual disease, additional adjuvant chemo with Adriamycin and Cytoxan 12/18/2017-01/27/2018 4. 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   She has 1 more cycle of Herceptin left.  We can see her back in 6 months for a follow-up visit.

## 2018-09-28 ENCOUNTER — Encounter: Payer: Self-pay | Admitting: *Deleted

## 2018-09-28 NOTE — Progress Notes (Signed)
Patient Care Team: Deland Pretty, MD as PCP - General (Internal Medicine) Baxley, Cresenciano Lick, MD (Internal Medicine)  DIAGNOSIS:    ICD-10-CM   1. Malignant neoplasm of upper-outer quadrant of right breast in female, estrogen receptor negative ([place]) C50.411    Z17.1     SUMMARY OF ONCOLOGIC HISTORY:   Malignant neoplasm of upper-outer quadrant of right breast in female, estrogen receptor negative ([place])   07/09/2017 Initial Diagnosis    Patient palpated a right breast mass which was evaluated by mammogram and ultrasound and a breast MRI which revealed 4.5 x 4 cm necrotic tumor with suspicious multiple right axillary lymph nodes; biopsy revealed IDC grade 3 ER 0%, PR 0%, HER-2 positive ratio 3.08, Ki-67 70%, T2NX stage IIa/IIb    07/18/2017 - 11/04/2017 Neo-Adjuvant Chemotherapy    TCH Perjeta x6 cycles followed by Herceptin Perjeta maintenance    08/15/2017 Genetic Testing    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  The Multi-Gene Panel offered by [facility] includes sequencing and/or deletion duplication testing of the following 83 genes: ALK, APC, ATM, AXIN2,BAP1,  BARD1, BLM, BMPR1A, BRCA1, BRCA2, BRIP1, CASR, CDC73, CDH1, CDK4, CDKN1B, CDKN1C, CDKN2A (p14ARF), CDKN2A (p16INK4a), CEBPA, CHEK2, CTNNA1, DICER1, DIS3L2, EGFR (c.2369C>T, p.Thr790Met variant only), EPCAM (Deletion/duplication testing only), FH, FLCN, GATA2, GPC3, GREM1 (Promoter region deletion/duplication testing only), HOXB13 (c.251G>A, p.Gly84Glu), HRAS, KIT, MAX, MEN1, MET, MITF (c.952G>A, p.Glu318Lys variant only), MLH1, MSH2, MSH3, MSH6, MUTYH, NBN, NF1, NF2, NTHL1, PALB2, PDGFRA, PHOX2B, PMS2, POLD1, POLE, POT1, PRKAR1A, PTCH1, PTEN, RAD50, RAD51C, RAD51D, RB1, RECQL4, RET, RUNX1, SDHAF2, SDHA (sequence changes only), SDHB, SDHC, SDHD, SMAD4, SMARCA4, SMARCB1, SMARCE1, STK11, SUFU, TERT, TERT, TMEM127, TP53, TSC1, TSC2, VHL, WRN and WT1.  The report date is August 15, 2017.    11/18/2017 Surgery   Bilateral mastectomies: Right mastectomy: IDC grade 3 3.8 cm margins negative, 0/4 lymph nodes negative, ER 0%, PR 0%, HER-2 negative ratio 1.3, IHC HER-2 negative; Ki-67 70%, RCB class II; left mastectomy: [facility]    12/18/2017 - 01/27/2018 Chemotherapy    Adjuvant chemotherapy with dose dense Adriamycin and Cytoxan x4    02/17/2018 -  Chemotherapy    Maintenance Herceptin and Perjeta    02/24/2018 Surgery    REMOVAL OF BILATERAL TISSUE EXPANDERS WITH PLACEMENT OF BILATERAL BREAST IMPLANTS and LIPOFILLING FROM ABDOMEN TO BILATERAL CHEST by Dr. Iran Planas      CHIEF COMPLIANT: Follow-up of maintenance Herceptin  INTERVAL HISTORY: Rebecca [name] is a 48 y.o. with above-mentioned history of HER-2 positive breast cancer treated with neoadjuvatc chemotherapy, bilateral mastectomies, and who iscurrentlyreceivingHerceptin maintenance. ECHO on 08/20/18 showed an ejection fraction in the range of 40-45%. She presents to the clinicalonetoday for treatment.  She recently had liposuction and the has recovered very well from that.  REVIEW OF SYSTEMS:   Constitutional: Denies fevers, chills or abnormal weight loss Eyes: Denies blurriness of vision Ears, nose, mouth, throat, and face: Denies mucositis or sore throat Respiratory: Denies cough, dyspnea or wheezes Cardiovascular: Denies palpitation, chest discomfort Gastrointestinal: Denies nausea, heartburn or change in bowel habits Skin: Denies abnormal skin rashes Lymphatics: Denies new lymphadenopathy or easy bruising Neurological: Denies numbness, tingling or new weaknesses Behavioral/Psych: Mood is stable, no new changes  Extremities: No lower extremity edema Breast: Bilateral breast reconstructions All other systems were reviewed with the patient and are negative.  I have reviewed the past medical history, past surgical history, social history and family history with the patient and they are unchanged from previous note.  ALLERGIES:  has  No Known Allergies.  MEDICATIONS:  Current Outpatient Medications  Medication Sig Dispense Refill  . carvedilol (COREG) 3.125 MG tablet TAKE 1 TABLET TWICE A DAY 180 tablet 1  . diphenoxylate-atropine (LOMOTIL) 2.5-0.025 MG tablet Take 1 tablet by mouth 4 (four) times daily as needed for diarrhea or loose stools. 30 tablet 2  . esomeprazole (NEXIUM) 40 MG capsule TAKE 1 CAPSULE (40 MG TOTAL) BY MOUTH DAILY AT 12 NOON. 90 capsule 2  . levothyroxine (SYNTHROID, LEVOTHROID) 137 MCG tablet Take 137 mcg by mouth daily before breakfast.    . LORazepam (ATIVAN) 1 MG tablet Take 1 tablet (1 mg total) by mouth every 8 (eight) hours as needed (nausea and vomiting). 30 tablet 0  . losartan (COZAAR) 25 MG tablet TAKE 1 TABLET BY MOUTH EVERY DAY 90 tablet 1  . metFORMIN (GLUCOPHAGE) 500 MG tablet Take by mouth daily with breakfast.    . ondansetron (ZOFRAN ODT) 4 MG disintegrating tablet Take 1 tablet (4 mg total) by mouth every 8 (eight) hours as needed for nausea or vomiting. 20 tablet 3  . rosuvastatin (CRESTOR) 20 MG tablet Take 1 tablet (20 mg total) by mouth daily. 90 tablet 3  . triamterene-hydrochlorothiazide (MAXZIDE) 75-50 MG tablet TAKE 1 TABLET BY MOUTH DAILY FOR EDEMA 90 tablet 0   No current facility-administered medications for this visit.    Facility-Administered Medications Ordered in Other Visits  Medication Dose Route Frequency Provider Last Rate Last Dose  . LORazepam (ATIVAN) injection 1 mg  1 mg Intravenous Once Harle Stanford., PA-C        PHYSICAL EXAMINATION: ECOG PERFORMANCE STATUS: 0 - Asymptomatic  Vitals:   09/29/18 0948  BP: 115/68  Pulse: 77  Resp: 18  Temp: 98.5 F (36.9 C)  SpO2: 100%   Filed Weights   09/29/18 0948  Weight: 237 lb 9.6 oz (107.8 kg)    Physical exam not performed due to COVID-19 precautions  LABORATORY DATA:  I have reviewed the data as listed CMP Latest Ref Rng & Units 09/08/2018 08/18/2018 07/31/2018  Glucose 70 - 99 mg/dL 123(H) 105(H)  104(H)  BUN 6 - 20 mg/dL 18 15 19   Creatinine 0.44 - 1.00 mg/dL 0.85 0.83 0.85  Sodium 135 - 145 mmol/L 140 141 141  Potassium 3.5 - 5.1 mmol/L 4.4 4.6 4.6  Chloride 98 - 111 mmol/L 105 104 103  CO2 22 - 32 mmol/L 25 27 27   Calcium 8.9 - 10.3 mg/dL 8.7(L) 9.2 9.5  Total Protein 6.5 - 8.1 g/dL 6.8 6.8 7.1  Total Bilirubin 0.3 - 1.2 mg/dL 0.3 0.3 0.3  Alkaline Phos 38 - 126 U/L 79 75 78  AST 15 - 41 U/L 15 14(L) 16  ALT 0 - 44 U/L 22 21 20     Lab Results  Component Value Date   WBC 5.4 09/29/2018   HGB 10.0 (L) 09/29/2018   HCT 33.0 (L) 09/29/2018   MCV 82.3 09/29/2018   PLT 329 09/29/2018   NEUTROABS 3.8 09/29/2018    ASSESSMENT & PLAN:  Malignant neoplasm of upper-outer quadrant of right breast in female, estrogen receptor negative ([place]) 07/09/2017:Patient palpated a right breast mass which was evaluated by mammogram and ultrasound and a breast MRI which revealed 4.5 x 4 cm necrotic tumor with suspicious multiple right axillary lymph nodes; biopsy revealed IDC grade 3 ER 0%, PR 0%, HER-2 positive ratio 3.08, Ki-67 70%, T2NX stage IIa/IIb  Treatment planbased on multidisciplinary tumor board: 1. Neoadjuvant chemotherapy with [place] Perjeta  6 cycles followed by Herceptinand Perjetamaintenance for 1 year 2. Followed bybilateral mastectomies with targeted node dissection on the right7/23/2019 3.Due to significant residual disease, additional adjuvant chemo with Adriamycin and Cytoxan 12/18/2017-01/27/2018 4.  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 She recently had liposuction and had lost some blood and that is why the hemoglobin is 10 today.  Surveillance: We discussed the role of scans and lab work.  Because she has had mild symptoms in her abdomen, we will plan to obtain a CT scan of chest abdomen pelvis in 6 months.  She would like to see me back in 3 months for routine checkup. We will add thyroid testing to her labs to be drawn in 3 weeks.  We can see her back in 6 months for a follow-up visit after CT scans.    No orders of the defined types were placed in this encounter.  The patient has a good understanding of the overall plan. she agrees with it. she will call with any problems that may develop before the next visit here.  Nicholas Lose, MD 09/29/2018  Julious Oka Dorshimer am acting as scribe for Dr. Nicholas Lose.  I have reviewed the above documentation for accuracy and completeness, and I agree with the above.

## 2018-09-29 ENCOUNTER — Inpatient Hospital Stay: Payer: BC Managed Care – PPO | Attending: Hematology and Oncology

## 2018-09-29 ENCOUNTER — Inpatient Hospital Stay: Payer: BC Managed Care – PPO

## 2018-09-29 ENCOUNTER — Other Ambulatory Visit: Payer: Self-pay

## 2018-09-29 ENCOUNTER — Inpatient Hospital Stay (HOSPITAL_BASED_OUTPATIENT_CLINIC_OR_DEPARTMENT_OTHER): Payer: BC Managed Care – PPO | Admitting: Hematology and Oncology

## 2018-09-29 DIAGNOSIS — G620 Drug-induced polyneuropathy: Secondary | ICD-10-CM | POA: Diagnosis not present

## 2018-09-29 DIAGNOSIS — C50411 Malignant neoplasm of upper-outer quadrant of right female breast: Secondary | ICD-10-CM

## 2018-09-29 DIAGNOSIS — Z5112 Encounter for antineoplastic immunotherapy: Secondary | ICD-10-CM | POA: Insufficient documentation

## 2018-09-29 DIAGNOSIS — Z9221 Personal history of antineoplastic chemotherapy: Secondary | ICD-10-CM | POA: Insufficient documentation

## 2018-09-29 DIAGNOSIS — T451X5A Adverse effect of antineoplastic and immunosuppressive drugs, initial encounter: Secondary | ICD-10-CM | POA: Diagnosis not present

## 2018-09-29 DIAGNOSIS — Z171 Estrogen receptor negative status [ER-]: Secondary | ICD-10-CM | POA: Insufficient documentation

## 2018-09-29 DIAGNOSIS — Z9013 Acquired absence of bilateral breasts and nipples: Secondary | ICD-10-CM | POA: Diagnosis not present

## 2018-09-29 DIAGNOSIS — Z95828 Presence of other vascular implants and grafts: Secondary | ICD-10-CM

## 2018-09-29 LAB — CBC WITH DIFFERENTIAL (CANCER CENTER ONLY)
Abs Immature Granulocytes: 0.01 10*3/uL (ref 0.00–0.07)
Basophils Absolute: 0 10*3/uL (ref 0.0–0.1)
Basophils Relative: 1 %
Eosinophils Absolute: 0.1 10*3/uL (ref 0.0–0.5)
Eosinophils Relative: 2 %
HCT: 33 % — ABNORMAL LOW (ref 36.0–46.0)
Hemoglobin: 10 g/dL — ABNORMAL LOW (ref 12.0–15.0)
Immature Granulocytes: 0 %
Lymphocytes Relative: 20 %
Lymphs Abs: 1.1 10*3/uL (ref 0.7–4.0)
MCH: 24.9 pg — ABNORMAL LOW (ref 26.0–34.0)
MCHC: 30.3 g/dL (ref 30.0–36.0)
MCV: 82.3 fL (ref 80.0–100.0)
Monocytes Absolute: 0.4 10*3/uL (ref 0.1–1.0)
Monocytes Relative: 8 %
Neutro Abs: 3.8 10*3/uL (ref 1.7–7.7)
Neutrophils Relative %: 69 %
Platelet Count: 329 10*3/uL (ref 150–400)
RBC: 4.01 MIL/uL (ref 3.87–5.11)
RDW: 15.5 % (ref 11.5–15.5)
WBC Count: 5.4 10*3/uL (ref 4.0–10.5)
nRBC: 0 % (ref 0.0–0.2)

## 2018-09-29 LAB — CMP (CANCER CENTER ONLY)
ALT: 18 U/L (ref 0–44)
AST: 22 U/L (ref 15–41)
Albumin: 3.8 g/dL (ref 3.5–5.0)
Alkaline Phosphatase: 71 U/L (ref 38–126)
Anion gap: 9 (ref 5–15)
BUN: 11 mg/dL (ref 6–20)
CO2: 26 mmol/L (ref 22–32)
Calcium: 9.3 mg/dL (ref 8.9–10.3)
Chloride: 105 mmol/L (ref 98–111)
Creatinine: 0.95 mg/dL (ref 0.44–1.00)
GFR, Est AFR Am: >60 mL/min (ref >60)
GFR, Estimated: >60 mL/min (ref >60)
Glucose, Bld: 106 mg/dL — ABNORMAL HIGH (ref 70–99)
Potassium: 4.6 mmol/L (ref 3.5–5.1)
Sodium: 140 mmol/L (ref 135–145)
Total Bilirubin: 0.4 mg/dL (ref 0.3–1.2)
Total Protein: 6.9 g/dL (ref 6.5–8.1)

## 2018-09-29 MED ORDER — DIPHENHYDRAMINE HCL 25 MG PO CAPS
ORAL_CAPSULE | ORAL | Status: AC
  Filled 2018-09-29: qty 2

## 2018-09-29 MED ORDER — HEPARIN SOD (PORK) LOCK FLUSH 100 UNIT/ML IV SOLN
500.0000 [IU] | Freq: Once | INTRAVENOUS | Status: AC | PRN
  Administered 2018-09-29: 500 [IU]
  Filled 2018-09-29: qty 5

## 2018-09-29 MED ORDER — TRASTUZUMAB CHEMO 150 MG IV SOLR
600.0000 mg | Freq: Once | INTRAVENOUS | Status: AC
  Administered 2018-09-29: 600 mg via INTRAVENOUS
  Filled 2018-09-29: qty 28.57

## 2018-09-29 MED ORDER — ACETAMINOPHEN 325 MG PO TABS
ORAL_TABLET | ORAL | Status: AC
  Filled 2018-09-29: qty 2

## 2018-09-29 MED ORDER — ACETAMINOPHEN 325 MG PO TABS
650.0000 mg | ORAL_TABLET | Freq: Once | ORAL | Status: AC
  Administered 2018-09-29: 650 mg via ORAL

## 2018-09-29 MED ORDER — DIPHENHYDRAMINE HCL 25 MG PO CAPS
50.0000 mg | ORAL_CAPSULE | Freq: Once | ORAL | Status: AC
  Administered 2018-09-29: 50 mg via ORAL

## 2018-09-29 MED ORDER — SODIUM CHLORIDE 0.9% FLUSH
10.0000 mL | INTRAVENOUS | Status: DC | PRN
  Administered 2018-09-29: 10 mL
  Filled 2018-09-29: qty 10

## 2018-09-29 MED ORDER — SODIUM CHLORIDE 0.9 % IV SOLN
Freq: Once | INTRAVENOUS | Status: AC
  Administered 2018-09-29: 10:00:00 via INTRAVENOUS
  Filled 2018-09-29: qty 250

## 2018-09-29 MED ORDER — SODIUM CHLORIDE 0.9% FLUSH
10.0000 mL | INTRAVENOUS | Status: DC | PRN
  Administered 2018-09-29: 10:00:00 10 mL
  Filled 2018-09-29: qty 10

## 2018-09-29 NOTE — Patient Instructions (Signed)
Coronavirus (COVID-19) Are you at risk?  Are you at risk for the Coronavirus (COVID-19)?  To be considered HIGH RISK for Coronavirus (COVID-19), you have to meet the following criteria:  . Traveled to [place], [place], [place], [place] or [place]; or in the [place] to [place], [place], [place], or [place]; and have fever, cough, and shortness of breath within the last 2 weeks of travel OR . Been in close contact with a person diagnosed with COVID-19 within the last 2 weeks and have fever, cough, and shortness of breath . IF YOU DO NOT MEET THESE CRITERIA, YOU ARE CONSIDERED LOW RISK FOR COVID-19.  What to do if you are HIGH RISK for COVID-19?  . If you are having a medical emergency, call 911. . Seek medical care right away. Before you go to a doctor's office, urgent care or emergency department, call ahead and tell them about your recent travel, contact with someone diagnosed with COVID-19, and your symptoms. You should receive instructions from your physician's office regarding next steps of care.  . When you arrive at healthcare provider, tell the healthcare staff immediately you have returned from visiting [place], [place], [place], [place] or [place]; or traveled in the [place] to [place], [place], [place], or [place]; in the last two weeks or you have been in close contact with a person diagnosed with COVID-19 in the last 2 weeks.   . Tell the health care staff about your symptoms: fever, cough and shortness of breath. . After you have been seen by a medical provider, you will be either: o Tested for (COVID-19) and discharged home on quarantine except to seek medical care if symptoms worsen, and asked to  - Stay home and avoid contact with others until you get your results (4-5 days)  - Avoid travel on public transportation if possible (such as bus, train, or airplane) or o Sent to the Emergency Department by EMS for evaluation, COVID-19 testing, and possible  admission depending on your condition and test results.  What to do if you are LOW RISK for COVID-19?  Reduce your risk of any infection by using the same precautions used for avoiding the common cold or flu:  . Wash your hands often with soap and warm water for at least 20 seconds.  If soap and water are not readily available, use an alcohol-based hand sanitizer with at least 60% alcohol.  . If coughing or sneezing, cover your mouth and nose by coughing or sneezing into the elbow areas of your shirt or coat, into a tissue or into your sleeve (not your hands). . Avoid shaking hands with others and consider head nods or verbal greetings only. . Avoid touching your eyes, nose, or mouth with unwashed hands.  . Avoid close contact with people who are sick. . Avoid places or events with large numbers of people in one location, like concerts or sporting events. . Carefully consider travel plans you have or are making. . If you are planning any travel outside or inside the [place], visit the CDC's Travelers' Health webpage for the latest health notices. . If you have some symptoms but not all symptoms, continue to monitor at home and seek medical attention if your symptoms worsen. . If you are having a medical emergency, call 911.   ADDITIONAL HEALTHCARE OPTIONS FOR PATIENTS  [facility] / e-Visit: [email or web address]         [facility]: [phone]  [facility]: [phone]                   [facility]: [phone]    [facility] Discharge Instructions for Patients Receiving Chemotherapy  Today you received the following chemotherapy agents Herceptin   To help prevent nausea and vomiting after your treatment, we encourage you to take your nausea medication as directed.    If you develop nausea and vomiting that is not controlled by your nausea medication, call the clinic.   BELOW  ARE SYMPTOMS THAT SHOULD BE REPORTED IMMEDIATELY:  *FEVER GREATER THAN 100.5 F  *CHILLS WITH OR WITHOUT FEVER  NAUSEA AND VOMITING THAT IS NOT CONTROLLED WITH YOUR NAUSEA MEDICATION  *UNUSUAL SHORTNESS OF BREATH  *UNUSUAL BRUISING OR BLEEDING  TENDERNESS IN MOUTH AND THROAT WITH OR WITHOUT PRESENCE OF ULCERS  *URINARY PROBLEMS  *BOWEL PROBLEMS  UNUSUAL RASH Items with * indicate a potential emergency and should be followed up as soon as possible.  Feel free to call the clinic should you have any questions or concerns. The clinic phone number is [phone].  Please show the CHEMO ALERT CARD at check-in to the Emergency Department and triage nurse.   

## 2018-09-29 NOTE — Progress Notes (Signed)
OK to treat with last ECHO per MD Lindi Adie

## 2018-09-30 ENCOUNTER — Telehealth: Payer: Self-pay | Admitting: Hematology and Oncology

## 2018-09-30 NOTE — Telephone Encounter (Signed)
I left a message regarding schedule  

## 2018-10-19 ENCOUNTER — Telehealth: Payer: Self-pay | Admitting: *Deleted

## 2018-10-19 ENCOUNTER — Other Ambulatory Visit: Payer: Self-pay | Admitting: *Deleted

## 2018-10-19 DIAGNOSIS — Z171 Estrogen receptor negative status [ER-]: Secondary | ICD-10-CM

## 2018-10-19 DIAGNOSIS — C50411 Malignant neoplasm of upper-outer quadrant of right female breast: Secondary | ICD-10-CM

## 2018-10-19 NOTE — Telephone Encounter (Signed)
RN called pt to let her know an echo apt has been scheduled for Friday 10/23/2018 at 11am.  Pt verbalized she is worried that her EF has dropped and wants to know if she needs to have her last herceptin treatment tomorrow 10/20/2018.  Per Dr. Lindi Adie, pt does not need to have last dose of herceptin tomorrow.  Pt will have echo Friday 10/23/2018 and follow up with Dr. Lindi Adie Monday 10/26/2018 review results.  Pt verbalized understanding.

## 2018-10-20 ENCOUNTER — Encounter: Payer: Self-pay | Admitting: *Deleted

## 2018-10-20 ENCOUNTER — Inpatient Hospital Stay: Payer: BC Managed Care – PPO

## 2018-10-20 ENCOUNTER — Ambulatory Visit: Payer: BLUE CROSS/BLUE SHIELD

## 2018-10-20 ENCOUNTER — Ambulatory Visit: Payer: BLUE CROSS/BLUE SHIELD | Admitting: Hematology and Oncology

## 2018-10-20 NOTE — Assessment & Plan Note (Signed)
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 Neoadjuvant chemotherapy with TCH Perjeta 6 cycles followed by Herceptinand Perjetamaintenance for 1 year 2. Followed bybilateral mastectomies with targeted node dissection on the right7/23/2019 3.Due to significant residual disease, additional adjuvant chemo with Adriamycin and Cytoxan 12/18/2017-01/27/2018 4.  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  Surveillance: We discussed the role of scans and lab work.  Because she has had mild symptoms in her abdomen, we will plan to obtain a CT scan of chest abdomen pelvis in 6 months.  She would like to see me back in 3 months for routine checkup and in 6 months for a follow-up visit after CT scans 

## 2018-10-21 ENCOUNTER — Telehealth (HOSPITAL_COMMUNITY): Payer: Self-pay | Admitting: *Deleted

## 2018-10-21 NOTE — Telephone Encounter (Signed)
Pt left VM requesting a call from [place] about her change in EF and herceptin use. Pts last echo was in march and there was a change in her EF but she continued with herceptin. She is scheduled for an echo Friday 6/26 but has some questions before then.   Routed to [place]

## 2018-10-23 ENCOUNTER — Ambulatory Visit (HOSPITAL_COMMUNITY)
Admission: RE | Admit: 2018-10-23 | Discharge: 2018-10-23 | Disposition: A | Discharge status: Home / Self Care | Payer: BC Managed Care – PPO | Source: Ambulatory Visit | Attending: Hematology and Oncology | Admitting: Hematology and Oncology

## 2018-10-23 ENCOUNTER — Other Ambulatory Visit: Payer: Self-pay

## 2018-10-23 DIAGNOSIS — C50411 Malignant neoplasm of upper-outer quadrant of right female breast: Secondary | ICD-10-CM | POA: Diagnosis present

## 2018-10-23 DIAGNOSIS — Z171 Estrogen receptor negative status [ER-]: Secondary | ICD-10-CM | POA: Insufficient documentation

## 2018-10-23 DIAGNOSIS — I10 Essential (primary) hypertension: Secondary | ICD-10-CM | POA: Diagnosis not present

## 2018-10-23 DIAGNOSIS — I447 Left bundle-branch block, unspecified: Secondary | ICD-10-CM | POA: Insufficient documentation

## 2018-10-23 DIAGNOSIS — I428 Other cardiomyopathies: Secondary | ICD-10-CM

## 2018-10-23 DIAGNOSIS — Z87891 Personal history of nicotine dependence: Secondary | ICD-10-CM | POA: Insufficient documentation

## 2018-10-23 DIAGNOSIS — E119 Type 2 diabetes mellitus without complications: Secondary | ICD-10-CM | POA: Diagnosis not present

## 2018-10-23 NOTE — Progress Notes (Signed)
Patient Care Team: Deland Pretty, MD as PCP - General (Internal Medicine) Baxley, Cresenciano Lick, MD (Internal Medicine)  DIAGNOSIS:    ICD-10-CM   1. Malignant neoplasm of upper-outer quadrant of right breast in female, estrogen receptor negative ([place])  C50.411    Z17.1     SUMMARY OF ONCOLOGIC HISTORY: Oncology History  Malignant neoplasm of upper-outer quadrant of right breast in female, estrogen receptor negative ([place])  07/09/2017 Initial Diagnosis   Patient palpated a right breast mass which was evaluated by mammogram and ultrasound and a breast MRI which revealed 4.5 x 4 cm necrotic tumor with suspicious multiple right axillary lymph nodes; biopsy revealed IDC grade 3 ER 0%, PR 0%, HER-2 positive ratio 3.08, Ki-67 70%, T2NX stage IIa/IIb   07/18/2017 - 11/04/2017 Neo-Adjuvant Chemotherapy   TCH Perjeta x6 cycles followed by Herceptin Perjeta maintenance   08/15/2017 Genetic Testing   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  The Multi-Gene Panel offered by [facility] includes sequencing and/or deletion duplication testing of the following 83 genes: ALK, APC, ATM, AXIN2,BAP1,  BARD1, BLM, BMPR1A, BRCA1, BRCA2, BRIP1, CASR, CDC73, CDH1, CDK4, CDKN1B, CDKN1C, CDKN2A (p14ARF), CDKN2A (p16INK4a), CEBPA, CHEK2, CTNNA1, DICER1, DIS3L2, EGFR (c.2369C>T, p.Thr790Met variant only), EPCAM (Deletion/duplication testing only), FH, FLCN, GATA2, GPC3, GREM1 (Promoter region deletion/duplication testing only), HOXB13 (c.251G>A, p.Gly84Glu), HRAS, KIT, MAX, MEN1, MET, MITF (c.952G>A, p.Glu318Lys variant only), MLH1, MSH2, MSH3, MSH6, MUTYH, NBN, NF1, NF2, NTHL1, PALB2, PDGFRA, PHOX2B, PMS2, POLD1, POLE, POT1, PRKAR1A, PTCH1, PTEN, RAD50, RAD51C, RAD51D, RB1, RECQL4, RET, RUNX1, SDHAF2, SDHA (sequence changes only), SDHB, SDHC, SDHD, SMAD4, SMARCA4, SMARCB1, SMARCE1, STK11, SUFU, TERT, TERT, TMEM127, TP53, TSC1, TSC2, VHL, WRN and WT1.  The report date is August 15, 2017.   11/18/2017  Surgery   Bilateral mastectomies: Right mastectomy: IDC grade 3 3.8 cm margins negative, 0/4 lymph nodes negative, ER 0%, PR 0%, HER-2 negative ratio 1.3, IHC HER-2 negative; Ki-67 70%, RCB class II; left mastectomy: [facility]   12/18/2017 - 01/27/2018 Chemotherapy   Adjuvant chemotherapy with dose dense Adriamycin and Cytoxan x4   02/17/2018 -  Chemotherapy   Maintenance Herceptin and Perjeta   02/24/2018 Surgery   REMOVAL OF BILATERAL TISSUE EXPANDERS WITH PLACEMENT OF BILATERAL BREAST IMPLANTS and LIPOFILLING FROM ABDOMEN TO BILATERAL CHEST by Dr. Iran Planas      CHIEF COMPLIANT: Follow-up to discuss echocardiogram results  INTERVAL HISTORY: Rebecca [name] is a 48 y.o. with above-mentioned history of HER-2 positive breast cancer treated with neoadjuvant chemotherapy, bilateral mastectomies, and who completed Herceptin maintenance on 09/29/18. ECHO on 10/23/18 showed an ejection fraction in the range of 40-45%. She presents to the clinicalonetoday to discuss the results of her echocardiogram.   REVIEW OF SYSTEMS:   Constitutional: Denies fevers, chills or abnormal weight loss Eyes: Denies blurriness of vision Ears, nose, mouth, throat, and face: Denies mucositis or sore throat Respiratory: Denies cough, dyspnea or wheezes Cardiovascular: Denies palpitation, chest discomfort Gastrointestinal: Denies nausea, heartburn or change in bowel habits Skin: Denies abnormal skin rashes Lymphatics: Denies new lymphadenopathy or easy bruising Neurological: Denies numbness, tingling or new weaknesses Behavioral/Psych: Mood is stable, no new changes  Extremities: No lower extremity edema Breast: denies any pain or lumps or nodules in either breasts All other systems were reviewed with the patient and are negative.  I have reviewed the past medical history, past surgical history, social history and family history with the patient and they are unchanged from previous note.  ALLERGIES:  has No Known  Allergies.  MEDICATIONS:  Current Outpatient Medications  Medication Sig Dispense Refill  . carvedilol (COREG) 3.125 MG tablet TAKE 1 TABLET TWICE A DAY 180 tablet 1  . diphenoxylate-atropine (LOMOTIL) 2.5-0.025 MG tablet Take 1 tablet by mouth 4 (four) times daily as needed for diarrhea or loose stools. 30 tablet 2  . esomeprazole (NEXIUM) 40 MG capsule TAKE 1 CAPSULE (40 MG TOTAL) BY MOUTH DAILY AT 12 NOON. 90 capsule 2  . levothyroxine (SYNTHROID, LEVOTHROID) 137 MCG tablet Take 137 mcg by mouth daily before breakfast.    . LORazepam (ATIVAN) 1 MG tablet Take 1 tablet (1 mg total) by mouth every 8 (eight) hours as needed (nausea and vomiting). 30 tablet 0  . losartan (COZAAR) 25 MG tablet TAKE 1 TABLET BY MOUTH EVERY DAY 90 tablet 1  . metFORMIN (GLUCOPHAGE) 500 MG tablet Take by mouth daily with breakfast.    . ondansetron (ZOFRAN ODT) 4 MG disintegrating tablet Take 1 tablet (4 mg total) by mouth every 8 (eight) hours as needed for nausea or vomiting. 20 tablet 3  . rosuvastatin (CRESTOR) 20 MG tablet Take 1 tablet (20 mg total) by mouth daily. 90 tablet 3  . triamterene-hydrochlorothiazide (MAXZIDE) 75-50 MG tablet TAKE 1 TABLET BY MOUTH DAILY FOR EDEMA 90 tablet 0   No current facility-administered medications for this visit.    Facility-Administered Medications Ordered in Other Visits  Medication Dose Route Frequency Provider Last Rate Last Dose  . LORazepam (ATIVAN) injection 1 mg  1 mg Intravenous Once Rebecca Stanford., PA-C        PHYSICAL EXAMINATION: ECOG PERFORMANCE STATUS: 1 - Symptomatic but completely ambulatory  Vitals:   10/26/18 1049  BP: 132/88  Pulse: 87  Resp: 18  Temp: 99.3 F (37.4 C)  SpO2: 98%   Filed Weights   10/26/18 1049  Weight: 232 lb 9.6 oz (105.5 kg)    Physical exam not performed due to COVID-19 precautions  LABORATORY DATA:  I have reviewed the data as listed CMP Latest Ref Rng & Units 09/29/2018 09/08/2018 08/18/2018  Glucose 70 - 99 mg/dL  106(H) 123(H) 105(H)  BUN 6 - 20 mg/dL 11 18 15   Creatinine 0.44 - 1.00 mg/dL 0.95 0.85 0.83  Sodium 135 - 145 mmol/L 140 140 141  Potassium 3.5 - 5.1 mmol/L 4.6 4.4 4.6  Chloride 98 - 111 mmol/L 105 105 104  CO2 22 - 32 mmol/L 26 25 27   Calcium 8.9 - 10.3 mg/dL 9.3 8.7(L) 9.2  Total Protein 6.5 - 8.1 g/dL 6.9 6.8 6.8  Total Bilirubin 0.3 - 1.2 mg/dL 0.4 0.3 0.3  Alkaline Phos 38 - 126 U/L 71 79 75  AST 15 - 41 U/L 22 15 14(L)  ALT 0 - 44 U/L 18 22 21     Lab Results  Component Value Date   WBC 5.4 09/29/2018   HGB 10.0 (L) 09/29/2018   HCT 33.0 (L) 09/29/2018   MCV 82.3 09/29/2018   PLT 329 09/29/2018   NEUTROABS 3.8 09/29/2018    ASSESSMENT & PLAN:  Malignant neoplasm of upper-outer quadrant of right breast in female, estrogen receptor negative ([place]) 07/09/2017:Patient palpated a right breast mass which was evaluated by mammogram and ultrasound and a breast MRI which revealed 4.5 x 4 cm necrotic tumor with suspicious multiple right axillary lymph nodes; biopsy revealed IDC grade 3 ER 0%, PR 0%, HER-2 positive ratio 3.08, Ki-67 70%, T2NX stage IIa/IIb  Treatment planbased on multidisciplinary tumor board: 1. Neoadjuvant chemotherapy with TCH Perjeta 6 cycles followed  by Herceptinand Perjetamaintenance for 1 year 2. Followed bybilateral mastectomies with targeted node dissection on the right7/23/2019 3.Due to significant residual disease, additional adjuvant chemo with Adriamycin and Cytoxan 12/18/2017-01/27/2018 4.  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  I sent this report to Dr. Haroldine [name] to get his opinion.  She completed the Herceptin so I anticipate that she will improve her ejection fraction over time. I would like to repeat an echocardiogram in 2 months and follow-up after that.  significant emotional stressors related to her family.  Surveillance: We discussed the role of scans and lab work.  Because she has had mild symptoms in her abdomen, we will plan to obtain a CT scan of chest abdomen pelvis in 6 months (dec 2020).  She would like to see me back in 3 months for routine checkup and in 6 months for a follow-up visit after CT scans  No orders of the defined types were placed in this encounter.  The patient has a good understanding of the overall plan. she agrees with it. she will call with any problems that may develop before the next visit here.  Rebecca Lose, MD 10/26/2018  Rebecca [name] am acting as scribe for Dr. Nicholas [name].  I have reviewed the above documentation for accuracy and completeness, and I agree with the above.

## 2018-10-23 NOTE — Progress Notes (Signed)
  Echocardiogram 2D Echocardiogram has been performed.  Rebecca [name] 10/23/2018, 11:57 AM

## 2018-10-24 ENCOUNTER — Encounter: Payer: Self-pay | Admitting: Hematology and Oncology

## 2018-10-26 ENCOUNTER — Other Ambulatory Visit: Payer: Self-pay

## 2018-10-26 ENCOUNTER — Encounter: Payer: Self-pay | Admitting: *Deleted

## 2018-10-26 ENCOUNTER — Inpatient Hospital Stay (HOSPITAL_BASED_OUTPATIENT_CLINIC_OR_DEPARTMENT_OTHER): Payer: BC Managed Care – PPO | Admitting: Hematology and Oncology

## 2018-10-26 VITALS — BP 132/88 | HR 87 | Temp 99.3°F | Resp 18 | Ht 70.0 in | Wt 232.6 lb

## 2018-10-26 DIAGNOSIS — Z9221 Personal history of antineoplastic chemotherapy: Secondary | ICD-10-CM

## 2018-10-26 DIAGNOSIS — Z171 Estrogen receptor negative status [ER-]: Secondary | ICD-10-CM | POA: Diagnosis not present

## 2018-10-26 DIAGNOSIS — C50411 Malignant neoplasm of upper-outer quadrant of right female breast: Secondary | ICD-10-CM | POA: Diagnosis not present

## 2018-10-26 DIAGNOSIS — Z5181 Encounter for therapeutic drug level monitoring: Secondary | ICD-10-CM

## 2018-10-26 DIAGNOSIS — G620 Drug-induced polyneuropathy: Secondary | ICD-10-CM

## 2018-10-26 DIAGNOSIS — Z9013 Acquired absence of bilateral breasts and nipples: Secondary | ICD-10-CM

## 2018-10-26 MED ORDER — MECLIZINE HCL 12.5 MG PO TABS: 12.5000 mg | ORAL_TABLET | Freq: Three times a day (TID) | ORAL | 0 refills | Status: DC | PRN

## 2018-11-05 ENCOUNTER — Other Ambulatory Visit: Payer: Self-pay | Admitting: Hematology and Oncology

## 2018-11-30 ENCOUNTER — Other Ambulatory Visit: Payer: Self-pay

## 2018-11-30 ENCOUNTER — Other Ambulatory Visit: Payer: BC Managed Care – PPO | Admitting: Internal Medicine

## 2018-11-30 DIAGNOSIS — F988 Other specified behavioral and emotional disorders with onset usually occurring in childhood and adolescence: Secondary | ICD-10-CM

## 2018-11-30 DIAGNOSIS — F410 Panic disorder [episodic paroxysmal anxiety] without agoraphobia: Secondary | ICD-10-CM

## 2018-11-30 DIAGNOSIS — R7989 Other specified abnormal findings of blood chemistry: Secondary | ICD-10-CM

## 2018-11-30 DIAGNOSIS — E782 Mixed hyperlipidemia: Secondary | ICD-10-CM

## 2018-11-30 DIAGNOSIS — R7302 Impaired glucose tolerance (oral): Secondary | ICD-10-CM

## 2018-12-01 ENCOUNTER — Other Ambulatory Visit: Payer: Self-pay | Admitting: Internal Medicine

## 2018-12-01 DIAGNOSIS — R718 Other abnormality of red blood cells: Secondary | ICD-10-CM

## 2018-12-02 ENCOUNTER — Other Ambulatory Visit: Payer: Self-pay

## 2018-12-02 ENCOUNTER — Ambulatory Visit (INDEPENDENT_AMBULATORY_CARE_PROVIDER_SITE_OTHER): Payer: BC Managed Care – PPO | Admitting: Internal Medicine

## 2018-12-02 ENCOUNTER — Encounter: Payer: Self-pay | Admitting: Internal Medicine

## 2018-12-02 VITALS — BP 100/80 | HR 84 | Temp 98.3°F | Ht 70.0 in | Wt 230.0 lb

## 2018-12-02 DIAGNOSIS — Z8639 Personal history of other endocrine, nutritional and metabolic disease: Secondary | ICD-10-CM

## 2018-12-02 DIAGNOSIS — Z9889 Other specified postprocedural states: Secondary | ICD-10-CM

## 2018-12-02 DIAGNOSIS — Z8719 Personal history of other diseases of the digestive system: Secondary | ICD-10-CM | POA: Diagnosis not present

## 2018-12-02 DIAGNOSIS — E781 Pure hyperglyceridemia: Secondary | ICD-10-CM

## 2018-12-02 DIAGNOSIS — F329 Major depressive disorder, single episode, unspecified: Secondary | ICD-10-CM

## 2018-12-02 DIAGNOSIS — F419 Anxiety disorder, unspecified: Secondary | ICD-10-CM

## 2018-12-02 DIAGNOSIS — K219 Gastro-esophageal reflux disease without esophagitis: Secondary | ICD-10-CM

## 2018-12-02 DIAGNOSIS — E7439 Other disorders of intestinal carbohydrate absorption: Secondary | ICD-10-CM

## 2018-12-02 DIAGNOSIS — E039 Hypothyroidism, unspecified: Secondary | ICD-10-CM | POA: Diagnosis not present

## 2018-12-02 DIAGNOSIS — F439 Reaction to severe stress, unspecified: Secondary | ICD-10-CM

## 2018-12-02 DIAGNOSIS — Z0000 Encounter for general adult medical examination without abnormal findings: Secondary | ICD-10-CM | POA: Diagnosis not present

## 2018-12-02 DIAGNOSIS — G620 Drug-induced polyneuropathy: Secondary | ICD-10-CM

## 2018-12-02 DIAGNOSIS — F32A Depression, unspecified: Secondary | ICD-10-CM

## 2018-12-02 DIAGNOSIS — D0511 Intraductal carcinoma in situ of right breast: Secondary | ICD-10-CM | POA: Diagnosis not present

## 2018-12-02 DIAGNOSIS — T451X5A Adverse effect of antineoplastic and immunosuppressive drugs, initial encounter: Secondary | ICD-10-CM

## 2018-12-02 DIAGNOSIS — R609 Edema, unspecified: Secondary | ICD-10-CM

## 2018-12-02 LAB — CBC WITH DIFFERENTIAL/PLATELET
Absolute Monocytes: 490 cells/uL (ref 200–950)
Basophils Absolute: 27 cells/uL (ref 0–200)
Basophils Relative: 0.4 %
Eosinophils Absolute: 82 cells/uL (ref 15–500)
Eosinophils Relative: 1.2 %
HCT: 36.7 % (ref 35.0–45.0)
Hemoglobin: 11.8 g/dL (ref 11.7–15.5)
Lymphs Abs: 1782 cells/uL (ref 850–3900)
MCH: 24.7 pg — ABNORMAL LOW (ref 27.0–33.0)
MCHC: 32.2 g/dL (ref 32.0–36.0)
MCV: 76.8 fL — ABNORMAL LOW (ref 80.0–100.0)
MPV: 10 fL (ref 7.5–12.5)
Monocytes Relative: 7.2 %
Neutro Abs: 4420 cells/uL (ref 1500–7800)
Neutrophils Relative %: 65 %
Platelets: 326 10*3/uL (ref 140–400)
RBC: 4.78 10*6/uL (ref 3.80–5.10)
RDW: 15.4 % — ABNORMAL HIGH (ref 11.0–15.0)
Total Lymphocyte: 26.2 %
WBC: 6.8 10*3/uL (ref 3.8–10.8)

## 2018-12-02 LAB — POCT URINALYSIS DIPSTICK
Appearance: NEGATIVE
Bilirubin, UA: NEGATIVE
Blood, UA: NEGATIVE
Glucose, UA: NEGATIVE
Ketones, UA: NEGATIVE
Leukocytes, UA: NEGATIVE
Nitrite, UA: NEGATIVE
Odor: NEGATIVE
Protein, UA: NEGATIVE
Spec Grav, UA: 1.01 (ref 1.010–1.025)
Urobilinogen, UA: 0.2 E.U./dL
pH, UA: 6.5 (ref 5.0–8.0)

## 2018-12-02 LAB — COMPLETE METABOLIC PANEL WITH GFR
AG Ratio: 1.8 (calc) (ref 1.0–2.5)
ALT: 19 U/L (ref 6–29)
AST: 18 U/L (ref 10–35)
Albumin: 4.6 g/dL (ref 3.6–5.1)
Alkaline phosphatase (APISO): 77 U/L (ref 31–125)
BUN: 17 mg/dL (ref 7–25)
CO2: 28 mmol/L (ref 20–32)
Calcium: 9.9 mg/dL (ref 8.6–10.2)
Chloride: 101 mmol/L (ref 98–110)
Creat: 0.78 mg/dL (ref 0.50–1.10)
GFR, Est African American: 104 mL/min/{1.73_m2} (ref >=60)
GFR, Est Non African American: 90 mL/min/{1.73_m2} (ref >=60)
Globulin: 2.5 g/dL (calc) (ref 1.9–3.7)
Glucose, Bld: 106 mg/dL — ABNORMAL HIGH (ref 65–99)
Potassium: 4.8 mmol/L (ref 3.5–5.3)
Sodium: 139 mmol/L (ref 135–146)
Total Bilirubin: 0.5 mg/dL (ref 0.2–1.2)
Total Protein: 7.1 g/dL (ref 6.1–8.1)

## 2018-12-02 LAB — TEST AUTHORIZATION

## 2018-12-02 LAB — IRON,TIBC AND FERRITIN PANEL
%SAT: 24 % (calc) (ref 16–45)
Ferritin: 13 ng/mL — ABNORMAL LOW (ref 16–232)
Iron: 118 ug/dL (ref 40–190)
TIBC: 501 mcg/dL (calc) — ABNORMAL HIGH (ref 250–450)

## 2018-12-02 LAB — HEMOGLOBIN A1C
Hgb A1c MFr Bld: 6.3 % of total Hgb — ABNORMAL HIGH (ref <5.7)
Mean Plasma Glucose: 134 (calc)
eAG (mmol/L): 7.4 (calc)

## 2018-12-02 LAB — LIPID PANEL
Cholesterol: 177 mg/dL (ref <200)
HDL: 46 mg/dL — ABNORMAL LOW (ref >=50)
LDL Cholesterol (Calc): 97 mg/dL (calc)
Non-HDL Cholesterol (Calc): 131 mg/dL (calc) — ABNORMAL HIGH (ref <130)
Total CHOL/HDL Ratio: 3.8 (calc) (ref <5.0)
Triglycerides: 217 mg/dL — ABNORMAL HIGH (ref <150)

## 2018-12-02 LAB — MICROALBUMIN / CREATININE URINE RATIO
Creatinine, Urine: 29 mg/dL (ref 20–275)
Microalb, Ur: <0.2 mg/dL

## 2018-12-02 LAB — VITAMIN D 25 HYDROXY (VIT D DEFICIENCY, FRACTURES): Vit D, 25-Hydroxy: 27 ng/mL — ABNORMAL LOW (ref 30–100)

## 2018-12-02 LAB — TSH: TSH: 1.53 mIU/L

## 2018-12-02 MED ORDER — LORAZEPAM 1 MG PO TABS: ORAL_TABLET | ORAL | 3 refills | Status: DC

## 2018-12-02 MED ORDER — VENLAFAXINE HCL ER 75 MG PO CP24: 75.0000 mg | ORAL_CAPSULE | Freq: Every day | ORAL | 0 refills | Status: DC

## 2018-12-23 NOTE — Assessment & Plan Note (Signed)
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 Neoadjuvant chemotherapy with TCH Perjeta 6 cycles followed by Herceptinand Perjetamaintenance for 1 year 2. 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 Return to clinic in 3 months with scans and follow-up.

## 2018-12-25 ENCOUNTER — Ambulatory Visit (HOSPITAL_COMMUNITY)
Admission: RE | Admit: 2018-12-25 | Discharge: 2018-12-25 | Disposition: A | Discharge status: Home / Self Care | Payer: BC Managed Care – PPO | Source: Ambulatory Visit | Attending: Hematology and Oncology | Admitting: Hematology and Oncology

## 2018-12-25 ENCOUNTER — Other Ambulatory Visit: Payer: Self-pay

## 2018-12-25 DIAGNOSIS — K219 Gastro-esophageal reflux disease without esophagitis: Secondary | ICD-10-CM | POA: Insufficient documentation

## 2018-12-25 DIAGNOSIS — I447 Left bundle-branch block, unspecified: Secondary | ICD-10-CM | POA: Insufficient documentation

## 2018-12-25 DIAGNOSIS — E039 Hypothyroidism, unspecified: Secondary | ICD-10-CM | POA: Diagnosis not present

## 2018-12-25 DIAGNOSIS — I10 Essential (primary) hypertension: Secondary | ICD-10-CM | POA: Diagnosis not present

## 2018-12-25 DIAGNOSIS — Z5181 Encounter for therapeutic drug level monitoring: Secondary | ICD-10-CM | POA: Diagnosis present

## 2018-12-25 DIAGNOSIS — I059 Rheumatic mitral valve disease, unspecified: Secondary | ICD-10-CM | POA: Insufficient documentation

## 2018-12-25 DIAGNOSIS — Z79899 Other long term (current) drug therapy: Secondary | ICD-10-CM | POA: Insufficient documentation

## 2018-12-25 DIAGNOSIS — E119 Type 2 diabetes mellitus without complications: Secondary | ICD-10-CM | POA: Diagnosis not present

## 2018-12-25 DIAGNOSIS — Z853 Personal history of malignant neoplasm of breast: Secondary | ICD-10-CM | POA: Diagnosis not present

## 2018-12-25 NOTE — Progress Notes (Signed)
  Echocardiogram 2D Echocardiogram has been performed.  Jacquline Terrill G Lynnae Ludemann 12/25/2018, 11:03 AM

## 2018-12-27 NOTE — Patient Instructions (Addendum)
Patient is being prescribed lorazepam to take twice daily as needed for anxiety.  Starting on Effexor for depression and situational stress.  Follow-up September 3.  May benefit from counseling.  Continue PPI for reflux and Maxide 75 for lower extremity edema.  Continue thyroid replacement medication.

## 2018-12-27 NOTE — Progress Notes (Signed)
Subjective:    Patient ID: Rebecca [name], female    DOB: 1970-12-20, 48 y.o.   MRN: [id number]  HPI 48 year old female presents to the office for health maintenance exam and evaluation of medical issues.  Patient has history of HER-2 positive, ER PR negative, intraductal carcinoma right breast  diagnosed in March 2019.  She is status post bilateral mastectomies and reconstructive surgery by Dr.Thimmappa.  She is followed by Dr. Lindi Adie.  Currently maintained on Perjeta and Herceptin.    Social history: Married.  55 year old son and 23-year-old daughter.  Husband is an Cabin crew and owns his own company.  She is a Associate Professor.  She was in the [employer] for 14.5 years.  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  She has a Scientist, water quality in Programmer, applications from [employer].  She first presented to the office referred by Dr. Lyndon Code, a friend of hers, in 2017.  History of first-degree AV block diagnosed around 2012.  History of tubal ligation laparoscopically done by Dr. Michaelle Copas for gynecologist.  History of GE reflux and hiatal hernia.  C-section times 06/18/2005 and 2010.  Family history: Father died of lymphoma at age 35.  Mother died at age 60 of nonalcoholic cirrhosis of the liver, pneumonia and septicemia.  She also had chronic pancreatitis and diabetes.  One brother with history of diabetes.  No sisters.  Remote history of back pain with MRI of the LS spine done at [place].  Underwent PT and recovered.   History of attention deficit treated with Adderall.  Had diverticulitis in August 2017 that prompted colonoscopy.  Had pneumonia seen in the emergency department in 2016.  She has history of hyperthyroidism and is undergone radioactive iodine treatment.  No known drug allergies  Had tubal ligation in 2013  Cesarean sections in 2007 in 2010.   History of wisdom tooth extraction in the remote past  Right ear surgery 2009  Abdominoplasty 2012  GYN is Dr. Philis Pique  History of smoking 1/4 pack a day but quit some 12 years ago.  Social alcohol consumption.  Had colonoscopy 2017 for history of diverticulitis with removal of hyperplastic polyp at [facility].  Review of Systems situational stress with husband's family discussed at length today for more than 30 minutes.  Overall handling very well.     Objective:   Physical Exam Vital signs reviewed.  Skin warm and dry.  Nodes none.  Neck is supple without adenopathy.  No thyromegaly.  Chest clear.  Bilateral breast reconstruction.  Cardiac exam regular rate and rhythm normal S1 and S2.  Abdomen no hepatosplenomegaly masses or tenderness.  Pelvic exam deferred to GYN.  No lower extremity pitting edema.  Neuro no focal deficits.      Assessment & Plan:  Situational stress with family-discussed at length today.  May benefit from counseling.  Have prescribed lorazepam 1 mg twice daily as needed for anxiety.  Follow-up September 3.  Have also started her on Effexor XR 75 mg daily.  Dr. Haroldine Laws has her on losartan 25 mg daily and also carvedilol.  He also has her on generic Crestor 20 mg daily.  He monitors her echocardiogram.  She has ejection fraction 45 to 50% on echo.  Grade 1 diastolic dysfunction with septal motion dyssynergy and trivial mitral regurgitation.  History of left bundle branch block since 2013.  Followed by cardiology with  history of Herceptin therapy which can be cardiotoxic.  He feels her situation is quite stable.  She also received Adriamycin and Cytoxan from August through October 2019.  History of iron deficiency anemia treated with IV iron  For GE reflux takes Nexium 40 mg daily.  She is on Maxide 75 for lower extremity edema per Dr. Lindi Adie  History of hyperthyroidism status post treatment and now hypothyroid  History of HER-2 positive breast cancer  status post bilateral mastectomies, radiation therapy, chemotherapy.  Had grade 3 intraductal carcinoma approximately 4 cm in size with clear margins 0 out of 4 lymph nodes negative.  Anxiety and depression  History of glucose intolerance treated with Glucophage  Peripheral neuropathy from chemotherapy  Plan: She will take Ativan as needed twice daily started on the Effexor and follow-up with me in early September.

## 2018-12-29 ENCOUNTER — Other Ambulatory Visit: Payer: Self-pay

## 2018-12-29 ENCOUNTER — Other Ambulatory Visit: Payer: BC Managed Care – PPO | Admitting: Internal Medicine

## 2018-12-29 DIAGNOSIS — R790 Abnormal level of blood mineral: Secondary | ICD-10-CM

## 2018-12-29 DIAGNOSIS — E559 Vitamin D deficiency, unspecified: Secondary | ICD-10-CM

## 2018-12-29 DIAGNOSIS — R718 Other abnormality of red blood cells: Secondary | ICD-10-CM

## 2018-12-29 LAB — CBC WITH DIFFERENTIAL/PLATELET
Absolute Monocytes: 429 cells/uL (ref 200–950)
Basophils Absolute: 29 cells/uL (ref 0–200)
Basophils Relative: 0.5 %
Eosinophils Absolute: 52 cells/uL (ref 15–500)
Eosinophils Relative: 0.9 %
HCT: 39.1 % (ref 35.0–45.0)
Hemoglobin: 12 g/dL (ref 11.7–15.5)
Lymphs Abs: 1427 cells/uL (ref 850–3900)
MCH: 24.5 pg — ABNORMAL LOW (ref 27.0–33.0)
MCHC: 30.7 g/dL — ABNORMAL LOW (ref 32.0–36.0)
MCV: 79.8 fL — ABNORMAL LOW (ref 80.0–100.0)
MPV: 10.5 fL (ref 7.5–12.5)
Monocytes Relative: 7.4 %
Neutro Abs: 3863 cells/uL (ref 1500–7800)
Neutrophils Relative %: 66.6 %
Platelets: 294 10*3/uL (ref 140–400)
RBC: 4.9 10*6/uL (ref 3.80–5.10)
RDW: 17.2 % — ABNORMAL HIGH (ref 11.0–15.0)
Total Lymphocyte: 24.6 %
WBC: 5.8 10*3/uL (ref 3.8–10.8)

## 2018-12-29 LAB — IRON,TIBC AND FERRITIN PANEL
%SAT: 10 % (calc) — ABNORMAL LOW (ref 16–45)
Ferritin: 9 ng/mL — ABNORMAL LOW (ref 16–232)
Iron: 43 ug/dL (ref 40–190)
TIBC: 430 mcg/dL (calc) (ref 250–450)

## 2018-12-29 LAB — VITAMIN D 25 HYDROXY (VIT D DEFICIENCY, FRACTURES): Vit D, 25-Hydroxy: 33 ng/mL (ref 30–100)

## 2018-12-29 NOTE — Progress Notes (Signed)
Patient Care Team: Elby Showers, MD as PCP - General (Internal Medicine) Renold Genta Cresenciano Lick, MD (Internal Medicine)  DIAGNOSIS:    ICD-10-CM   1. Malignant neoplasm of upper-outer quadrant of right breast in female, estrogen receptor negative ([place])  C50.411 ECHOCARDIOGRAM COMPLETE   Z17.1 CT Abdomen Pelvis W Contrast    CT Chest W Contrast    SUMMARY OF ONCOLOGIC HISTORY: Oncology History  Malignant neoplasm of upper-outer quadrant of right breast in female, estrogen receptor negative ([place])  07/09/2017 Initial Diagnosis   Patient palpated a right breast mass which was evaluated by mammogram and ultrasound and a breast MRI which revealed 4.5 x 4 cm necrotic tumor with suspicious multiple right axillary lymph nodes; biopsy revealed IDC grade 3 ER 0%, PR 0%, HER-2 positive ratio 3.08, Ki-67 70%, T2NX stage IIa/IIb   07/18/2017 - 11/04/2017 Neo-Adjuvant Chemotherapy   TCH Perjeta x6 cycles followed by Herceptin Perjeta maintenance   08/15/2017 Genetic Testing   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  The Multi-Gene Panel offered by [facility] includes sequencing and/or deletion duplication testing of the following 83 genes: ALK, APC, ATM, AXIN2,BAP1,  BARD1, BLM, BMPR1A, BRCA1, BRCA2, BRIP1, CASR, CDC73, CDH1, CDK4, CDKN1B, CDKN1C, CDKN2A (p14ARF), CDKN2A (p16INK4a), CEBPA, CHEK2, CTNNA1, DICER1, DIS3L2, EGFR (c.2369C>T, p.Thr790Met variant only), EPCAM (Deletion/duplication testing only), FH, FLCN, GATA2, GPC3, GREM1 (Promoter region deletion/duplication testing only), HOXB13 (c.251G>A, p.Gly84Glu), HRAS, KIT, MAX, MEN1, MET, MITF (c.952G>A, p.Glu318Lys variant only), MLH1, MSH2, MSH3, MSH6, MUTYH, NBN, NF1, NF2, NTHL1, PALB2, PDGFRA, PHOX2B, PMS2, POLD1, POLE, POT1, PRKAR1A, PTCH1, PTEN, RAD50, RAD51C, RAD51D, RB1, RECQL4, RET, RUNX1, SDHAF2, SDHA (sequence changes only), SDHB, SDHC, SDHD, SMAD4, SMARCA4, SMARCB1, SMARCE1, STK11, SUFU, TERT, TERT, TMEM127, TP53, TSC1,  TSC2, VHL, WRN and WT1.  The report date is August 15, 2017.   11/18/2017 Surgery   Bilateral mastectomies: Right mastectomy: IDC grade 3 3.8 cm margins negative, 0/4 lymph nodes negative, ER 0%, PR 0%, HER-2 negative ratio 1.3, IHC HER-2 negative; Ki-67 70%, RCB class II; left mastectomy: [facility]   12/18/2017 - 01/27/2018 Chemotherapy   Adjuvant chemotherapy with dose dense Adriamycin and Cytoxan x4   02/17/2018 -  Chemotherapy   Maintenance Herceptin and Perjeta   02/24/2018 Surgery   REMOVAL OF BILATERAL TISSUE EXPANDERS WITH PLACEMENT OF BILATERAL BREAST IMPLANTS and LIPOFILLING FROM ABDOMEN TO BILATERAL CHEST by Dr. Iran Planas      CHIEF COMPLIANT: Follow-up to discuss echocradiogram  INTERVAL HISTORY: Rebecca [name] is a 48 y.o. with above-mentioned history of HER-2 positive breast cancertreated with neoadjuvant chemotherapy, bilateral mastectomies, andHerceptin maintenance.ECHO on 12/25/18 showed an ejection fraction in the range of 35-40%.She presents to the clinicalonetoday to discuss her echocardiogram.  She doesn't have any SOB or leg swelling. She feels fine without any problems or concerns.  REVIEW OF SYSTEMS:   Constitutional: Denies fevers, chills or abnormal weight loss Eyes: Denies blurriness of vision Ears, nose, mouth, throat, and face: Denies mucositis or sore throat Respiratory: Denies cough, dyspnea or wheezes Cardiovascular: Denies palpitation, chest discomfort Gastrointestinal: Denies nausea, heartburn or change in bowel habits Skin: Denies abnormal skin rashes Lymphatics: Denies new lymphadenopathy or easy bruising Neurological: Denies numbness, tingling or new weaknesses Behavioral/Psych: Mood is stable, no new changes  Extremities: No lower extremity edema Breast: denies any pain or lumps or nodules in either breasts All other systems were reviewed with the patient and are negative.  I have reviewed the past medical history, past surgical history,  social history and family history  with the patient and they are unchanged from previous note.  ALLERGIES:  has No Known Allergies.  MEDICATIONS:  Current Outpatient Medications  Medication Sig Dispense Refill   carvedilol (COREG) 3.125 MG tablet TAKE 1 TABLET TWICE A DAY 180 tablet 1   diphenoxylate-atropine (LOMOTIL) 2.5-0.025 MG tablet Take 1 tablet by mouth 4 (four) times daily as needed for diarrhea or loose stools. 30 tablet 2   esomeprazole (NEXIUM) 40 MG capsule TAKE 1 CAPSULE (40 MG TOTAL) BY MOUTH DAILY AT 12 NOON. 90 capsule 2   levothyroxine (SYNTHROID, LEVOTHROID) 137 MCG tablet Take 137 mcg by mouth daily before breakfast.     LORazepam (ATIVAN) 1 MG tablet One po bid prn 60 tablet 3   losartan (COZAAR) 25 MG tablet TAKE 1 TABLET BY MOUTH EVERY DAY 90 tablet 1   meclizine (ANTIVERT) 12.5 MG tablet Take 1 tablet (12.5 mg total) by mouth 3 (three) times daily as needed for dizziness. 30 tablet 0   metFORMIN (GLUCOPHAGE) 500 MG tablet Take by mouth daily with breakfast.     ondansetron (ZOFRAN ODT) 4 MG disintegrating tablet Take 1 tablet (4 mg total) by mouth every 8 (eight) hours as needed for nausea or vomiting. 20 tablet 3   rosuvastatin (CRESTOR) 20 MG tablet Take 1 tablet (20 mg total) by mouth daily. 90 tablet 3   triamterene-hydrochlorothiazide (MAXZIDE) 75-50 MG tablet TAKE 1 TABLET BY MOUTH DAILY FOR EDEMA 90 tablet 0   venlafaxine XR (EFFEXOR XR) 75 MG 24 hr capsule Take 1 capsule (75 mg total) by mouth daily with breakfast. 90 capsule 0   No current facility-administered medications for this visit.    Facility-Administered Medications Ordered in Other Visits  Medication Dose Route Frequency Provider Last Rate Last Dose   LORazepam (ATIVAN) injection 1 mg  1 mg Intravenous Once Harle Stanford., PA-C        PHYSICAL EXAMINATION: ECOG PERFORMANCE STATUS: 1 - Symptomatic but completely ambulatory  Vitals:   12/30/18 0900  BP: 113/87  Pulse: 90  Resp: 20   Temp: 97.8 F (36.6 C)  SpO2: 98%   Filed Weights   12/30/18 0900  Weight: 223 lb 14.4 oz (101.6 kg)    GENERAL: alert, no distress and comfortable SKIN: skin color, texture, turgor are normal, no rashes or significant lesions EYES: normal, Conjunctiva are pink and non-injected, sclera clear OROPHARYNX: no exudate, no erythema and lips, buccal mucosa, and tongue normal  NECK: supple, thyroid normal size, non-tender, without nodularity LYMPH: no palpable lymphadenopathy in the cervical, axillary or inguinal LUNGS: clear to auscultation and percussion with normal breathing effort HEART: regular rate & rhythm and no murmurs and no lower extremity edema ABDOMEN: abdomen soft, non-tender and normal bowel sounds MUSCULOSKELETAL: no cyanosis of digits and no clubbing  NEURO: alert & oriented x 3 with fluent speech, no focal motor/sensory deficits EXTREMITIES: No lower extremity edema  LABORATORY DATA:  I have reviewed the data as listed CMP Latest Ref Rng & Units 11/30/2018 09/29/2018 09/08/2018  Glucose 65 - 99 mg/dL 106(H) 106(H) 123(H)  BUN 7 - 25 mg/dL 17 11 18   Creatinine 0.50 - 1.10 mg/dL 0.78 0.95 0.85  Sodium 135 - 146 mmol/L 139 140 140  Potassium 3.5 - 5.3 mmol/L 4.8 4.6 4.4  Chloride 98 - 110 mmol/L 101 105 105  CO2 20 - 32 mmol/L 28 26 25   Calcium 8.6 - 10.2 mg/dL 9.9 9.3 8.7(L)  Total Protein 6.1 - 8.1 g/dL 7.1 6.9 6.8  Total  Bilirubin 0.2 - 1.2 mg/dL 0.5 0.4 0.3  Alkaline Phos 38 - 126 U/L - 71 79  AST 10 - 35 U/L 18 22 15   ALT 6 - 29 U/L 19 18 22     Lab Results  Component Value Date   WBC 5.8 12/29/2018   HGB 12.0 12/29/2018   HCT 39.1 12/29/2018   MCV 79.8 (L) 12/29/2018   PLT 294 12/29/2018   NEUTROABS 3,863 12/29/2018    ASSESSMENT & PLAN:  Malignant neoplasm of upper-outer quadrant of right breast in female, estrogen receptor negative ([place]) 07/09/2017:Patient palpated a right breast mass which was evaluated by mammogram and ultrasound and a breast MRI which  revealed 4.5 x 4 cm necrotic tumor with suspicious multiple right axillary lymph nodes; biopsy revealed IDC grade 3 ER 0%, PR 0%, HER-2 positive ratio 3.08, Ki-67 70%, T2NX stage IIa/IIb  Treatment planbased on multidisciplinary tumor board: 1. Neoadjuvant chemotherapy with TCH Perjeta 6 cycles followed by Herceptinand Perjetamaintenance for 1 year 2. 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 Haroldine Laws is following up.  I requested Dr. Haroldine Laws to evaluate her echocardiogram and advise her and if any changes needs to be taken.  Return to clinic in 3 months with scans and follow-up.  Orders Placed This Encounter  Procedures   CT Abdomen Pelvis W Contrast    Standing Status:   Future    Standing Expiration Date:   12/30/2019    Order Specific Question:   ** REASON FOR EXAM (FREE TEXT)    Answer:   Abdominal pain with H/O breast cancer    Order Specific Question:   If indicated for the ordered procedure, I authorize the administration of contrast media per Radiology protocol    Answer:   Yes    Order Specific Question:   Is patient pregnant?    Answer:   No    Order Specific Question:   Preferred imaging location?    Answer:   [facility]    Order Specific Question:   Is Oral Contrast requested for this  exam?    Answer:   Yes, Per Radiology protocol    Order Specific Question:   Radiology Contrast Protocol - do NOT remove file path    Answer:   \[email or web address]   CT Chest W Contrast    Standing Status:   Future    Standing Expiration Date:   12/30/2019    Order Specific Question:   ** REASON FOR EXAM (FREE TEXT)    Answer:   restaging breast cancer    Order Specific Question:   If indicated for the ordered procedure, I authorize the administration of contrast media per Radiology protocol    Answer:   Yes    Order Specific Question:   Is patient pregnant?    Answer:   No    Order Specific Question:   Preferred imaging location?    Answer:   [facility]    Order Specific Question:   Radiology Contrast Protocol - do NOT remove file path  Answer:   \[email or web address]   ECHOCARDIOGRAM COMPLETE    Standing Status:   Future    Standing Expiration Date:   03/30/2020    Order Specific Question:   Where should this test be performed    Answer:   [place]    Order Specific Question:   Perflutren DEFINITY (image enhancing agent) should be administered unless hypersensitivity or allergy exist    Answer:   Administer Perflutren    Order Specific Question:   Reason for exam-Echo    Answer:   Chemo  V67.2 / Z09   The patient has a good understanding of the overall plan. she agrees with it. she will call with any problems that may develop before the next visit here.  Nicholas Lose, MD 12/30/2018  Julious Oka Dorshimer am acting as scribe for Dr. Nicholas Lose.  I have reviewed the above documentation for accuracy and completeness, and I agree with the above.

## 2018-12-30 ENCOUNTER — Inpatient Hospital Stay: Payer: BC Managed Care – PPO | Attending: Hematology and Oncology | Admitting: Hematology and Oncology

## 2018-12-30 ENCOUNTER — Other Ambulatory Visit: Payer: Self-pay

## 2018-12-30 DIAGNOSIS — Z9013 Acquired absence of bilateral breasts and nipples: Secondary | ICD-10-CM | POA: Insufficient documentation

## 2018-12-30 DIAGNOSIS — Z171 Estrogen receptor negative status [ER-]: Secondary | ICD-10-CM | POA: Diagnosis not present

## 2018-12-30 DIAGNOSIS — Z79899 Other long term (current) drug therapy: Secondary | ICD-10-CM | POA: Insufficient documentation

## 2018-12-30 DIAGNOSIS — Z9221 Personal history of antineoplastic chemotherapy: Secondary | ICD-10-CM | POA: Insufficient documentation

## 2018-12-30 DIAGNOSIS — C50411 Malignant neoplasm of upper-outer quadrant of right female breast: Secondary | ICD-10-CM

## 2018-12-30 DIAGNOSIS — Z7984 Long term (current) use of oral hypoglycemic drugs: Secondary | ICD-10-CM | POA: Insufficient documentation

## 2018-12-30 DIAGNOSIS — G620 Drug-induced polyneuropathy: Secondary | ICD-10-CM | POA: Diagnosis not present

## 2018-12-31 ENCOUNTER — Other Ambulatory Visit: Payer: Self-pay

## 2018-12-31 ENCOUNTER — Encounter: Payer: Self-pay | Admitting: Internal Medicine

## 2018-12-31 ENCOUNTER — Ambulatory Visit: Payer: BC Managed Care – PPO | Admitting: Internal Medicine

## 2018-12-31 VITALS — BP 120/80 | HR 102 | Temp 98.3°F | Ht 70.0 in | Wt 224.0 lb

## 2018-12-31 DIAGNOSIS — F439 Reaction to severe stress, unspecified: Secondary | ICD-10-CM

## 2018-12-31 DIAGNOSIS — F32A Depression, unspecified: Secondary | ICD-10-CM

## 2018-12-31 DIAGNOSIS — F419 Anxiety disorder, unspecified: Secondary | ICD-10-CM | POA: Diagnosis not present

## 2018-12-31 DIAGNOSIS — E559 Vitamin D deficiency, unspecified: Secondary | ICD-10-CM

## 2018-12-31 DIAGNOSIS — F329 Major depressive disorder, single episode, unspecified: Secondary | ICD-10-CM

## 2018-12-31 DIAGNOSIS — D508 Other iron deficiency anemias: Secondary | ICD-10-CM

## 2019-01-05 HISTORY — PX: PORT-A-CATH REMOVAL: SHX5289

## 2019-01-06 ENCOUNTER — Encounter: Payer: Self-pay | Admitting: Hematology and Oncology

## 2019-01-06 ENCOUNTER — Telehealth (HOSPITAL_COMMUNITY): Payer: Self-pay | Admitting: *Deleted

## 2019-01-06 NOTE — Telephone Encounter (Signed)
Received message from Dr Geralyn Flash office that pt needs appt asap as echo showed a decreased ef, called pt and offered her an appt w/Dr Bensimhon on Fri 9/11 however pt declined and stated she is going to see Dr Einar Gip instead, Dr Haroldine Laws is aware

## 2019-01-13 ENCOUNTER — Ambulatory Visit: Payer: BC Managed Care – PPO | Admitting: Cardiology

## 2019-01-13 ENCOUNTER — Encounter: Payer: Self-pay | Admitting: Cardiology

## 2019-01-13 ENCOUNTER — Telehealth (HOSPITAL_COMMUNITY): Payer: Self-pay | Admitting: Emergency Medicine

## 2019-01-13 VITALS — BP 132/88 | HR 93 | Ht 70.0 in | Wt 227.4 lb

## 2019-01-13 DIAGNOSIS — I447 Left bundle-branch block, unspecified: Secondary | ICD-10-CM | POA: Diagnosis not present

## 2019-01-13 DIAGNOSIS — I427 Cardiomyopathy due to drug and external agent: Secondary | ICD-10-CM

## 2019-01-13 DIAGNOSIS — C50411 Malignant neoplasm of upper-outer quadrant of right female breast: Secondary | ICD-10-CM

## 2019-01-13 DIAGNOSIS — Z171 Estrogen receptor negative status [ER-]: Secondary | ICD-10-CM

## 2019-01-13 DIAGNOSIS — I2584 Coronary atherosclerosis due to calcified coronary lesion: Secondary | ICD-10-CM

## 2019-01-13 DIAGNOSIS — I2510 Atherosclerotic heart disease of native coronary artery without angina pectoris: Secondary | ICD-10-CM

## 2019-01-13 DIAGNOSIS — R0602 Shortness of breath: Secondary | ICD-10-CM

## 2019-01-13 DIAGNOSIS — R0609 Other forms of dyspnea: Secondary | ICD-10-CM

## 2019-01-13 MED ORDER — ENTRESTO 49-51 MG PO TABS: 1.0000 | ORAL_TABLET | Freq: Two times a day (BID) | ORAL | 1 refills | Status: DC

## 2019-01-13 MED ORDER — CARVEDILOL 6.25 MG PO TABS: 6.2500 mg | ORAL_TABLET | Freq: Two times a day (BID) | ORAL | 1 refills | Status: DC

## 2019-01-13 NOTE — Progress Notes (Signed)
Primary Physician/Referring:  Elby Showers, MD  Patient ID: Rebecca [name], female    DOB: Jun 30, 1970, 48 y.o.   MRN: [id number]  Chief Complaint  Patient presents with  . Congestive Heart Failure    FOLLOW UP  . Cardiomyopathy   HPI:    Rebecca [name]  is a 48 y.o. Caucasian female initially seen by me on 07/01/2016 for left bundle branch block. She has history of LBBB at least dating back to 2013. At that time she has had a negative nuclear stress test and echocardiogram revealed low normal LVEF at 45-50%. Past medical history includes hyperthyroidism S/P RAI therapy in March 2018, depression, chronic low back pain. She also had a calcium scoring CT scan that showed mininmal calcium at 56 in Feb 2018.   She was diagnosed with estrogen negative right breast cancer for which he underwent chemotherapy(Adjuvant chemotherapy with dose dense Adriamycin and Cytoxan x4)  and also bilateral mastectomy with reconstruction surgery on 11/18/2017.  Surveillance echocardiogram was chemotherapy attributed gradual decrease of LVEF.  Patient now referred by Dr. Sonny Dandy for evaluation of cardiomyopathy. Patient states she has noticed mild dyspnea over time with exertional activity. No chest pain, dizziness or syncope. She has occasional brief palpitations.   Past Medical History:  Diagnosis Date  . Anxiety   . Breast cancer ([place])   . Diabetes mellitus without complication ([place])   . Diverticulitis   . GERD (gastroesophageal reflux disease)   . Hiatal hernia   . Hypertension   . Hypothyroidism   . LBBB (left bundle branch block)    Past Surgical History:  Procedure Laterality Date  . BREAST RECONSTRUCTION WITH PLACEMENT OF TISSUE EXPANDER AND ALLODERM Bilateral 11/18/2017   Procedure: BILATERAL BREAST RECONSTRUCTION WITH PLACEMENT OF TISSUE EXPANDER AND ALLODERM;  Surgeon: Irene Limbo, MD;  Location: [facility];  Service: Plastics;  Laterality: Bilateral;  . CESAREAN  SECTION  2007/2010   x2  . LAPAROSCOPIC TUBAL LIGATION  06/12/2011   Procedure: LAPAROSCOPIC TUBAL LIGATION;  Surgeon: Daria Pastures, MD;  Location: [facility];  Service: Gynecology;  Laterality: N/A;  filshie clip  . LIPOSUCTION WITH LIPOFILLING Bilateral 02/24/2018   Procedure: LIPOFILLING FROM ABDOMEN TO BILATERAL CHEST;  Surgeon: Irene Limbo, MD;  Location: [facility];  Service: Plastics;  Laterality: Bilateral;  . MASTECTOMY W/ SENTINEL NODE BIOPSY Bilateral 11/18/2017   Procedure: BILATERAL TOTAL MASTECTOMIES WITH RIGHT SENTINEL LYMPH NODE BIOPSY;  Surgeon: Rolm Bookbinder, MD;  Location: [facility];  Service: General;  Laterality: Bilateral;  . mini tuck  2012   abdominal  . PORT-A-CATH REMOVAL Right 01/05/2019  . PORTACATH PLACEMENT Right 07/15/2017   Procedure: INSERTION PORT-A-CATH WITH ULTRASOUND;  Surgeon: Rolm Bookbinder, MD;  Location: [facility];  Service: General;  Laterality: Right;  . REMOVAL OF BILATERAL TISSUE EXPANDERS WITH PLACEMENT OF BILATERAL BREAST IMPLANTS Bilateral 02/24/2018   Procedure: REMOVAL OF BILATERAL TISSUE EXPANDERS WITH PLACEMENT OF BILATERAL BREAST IMPLANTS;  Surgeon: Irene Limbo, MD;  Location: [facility];  Service: Plastics;  Laterality: Bilateral;  . right breast cancer     upper-outer quadrant   . right ear surg  03/2008   Mohs  . TUBAL LIGATION    . US ECHOCARDIOGRAPHY  12-30-2007   EF 55-60%  . WISDOM TOOTH EXTRACTION     Social History   Socioeconomic History  . Marital status: Married    Spouse name: Not on file  . Number of children: 2  .  Years of education: Not on file  . Highest education level: Not on file  Occupational History  . Occupation: Neurosurgeon  . Financial resource strain: Not on file  . Food insecurity    Worry: Not on file    Inability: Not on file  . Transportation needs    Medical: Not on file    Non-medical: Not on file   Tobacco Use  . Smoking status: Former Smoker    Packs/day: 0.25    Years: 6.00    Pack years: 1.50    Types: Cigarettes    Quit date: 03/29/2005    Years since quitting: 13.8  . Smokeless tobacco: Never Used  Substance and Sexual Activity  . Alcohol use: Yes    Comment: socially  . Drug use: No  . Sexual activity: Yes    Birth control/protection: Surgical    Comment: BTL  Lifestyle  . Physical activity    Days per week: Not on file    Minutes per session: Not on file  . Stress: Not on file  Relationships  . Social Herbalist on phone: Not on file    Gets together: Not on file    Attends religious service: Not on file    Active member of club or organization: Not on file    Attends meetings of clubs or organizations: Not on file    Relationship status: Not on file  . Intimate partner violence    Fear of current or ex partner: Not on file    Emotionally abused: Not on file    Physically abused: Not on file    Forced sexual activity: Not on file  Other Topics Concern  . Not on file  Social History Narrative   ** Merged History Encounter **       ROS  Review of Systems  Constitution: Negative for chills, decreased appetite, malaise/fatigue and weight gain.  Cardiovascular: Positive for dyspnea on exertion and palpitations. Negative for leg swelling and syncope.  Endocrine: Negative for cold intolerance.  Hematologic/Lymphatic: Does not bruise/bleed easily.  Musculoskeletal: Negative for joint swelling.  Gastrointestinal: Negative for abdominal pain, anorexia, change in bowel habit, hematochezia and melena.  Neurological: Negative for headaches and light-headedness.  Psychiatric/Behavioral: Negative for depression and substance abuse.  All other systems reviewed and are negative.  Objective   Vitals with BMI 01/13/2019 12/31/2018 12/30/2018  Height 5\' 10"  5\' 10"  5\' 10"   Weight 227 lbs 6 oz 224 lbs 223 lbs 14 oz  BMI 32.63 Q000111Q 0000000  Systolic Q000111Q [id number]   Diastolic 88 80 87  Pulse 93 102 90    Blood pressure 132/88, pulse 93, height 5\' 10"  (1.778 m), weight 227 lb 6.4 oz (103.1 kg), SpO2 94 %. Body mass index is 32.63 kg/m.   Physical Exam  Constitutional:  Well built and mildly obese in no acute distress  HENT:  Head: Atraumatic.  Eyes: Conjunctivae are normal.  Neck: Neck supple. No JVD present. No thyromegaly present.  Cardiovascular: Normal rate, regular rhythm, normal heart sounds and intact distal pulses. Exam reveals no gallop.  No murmur heard. Pulmonary/Chest: Effort normal and breath sounds normal.  Abdominal: Soft. Bowel sounds are normal.  Musculoskeletal: Normal range of motion.  Neurological: She is alert.  Skin: Skin is warm and dry.  Psychiatric: She has a normal mood and affect.   Radiology: No results found.  Laboratory examination:   Recent Labs    09/08/18 0814 09/29/18 0930  11/30/18 0921  NA 140 140 139  K 4.4 4.6 4.8  CL 105 105 101  CO2 25 26 28   GLUCOSE 123* 106* 106*  BUN 18 11 17   CREATININE 0.85 0.95 0.78  CALCIUM 8.7* 9.3 9.9  GFRNONAA >60 >60 90  GFRAA >60 >60 104   CMP Latest Ref Rng & Units 11/30/2018 09/29/2018 09/08/2018  Glucose 65 - 99 mg/dL 106(H) 106(H) 123(H)  BUN 7 - 25 mg/dL 17 11 18   Creatinine 0.50 - 1.10 mg/dL 0.78 0.95 0.85  Sodium 135 - 146 mmol/L 139 140 140  Potassium 3.5 - 5.3 mmol/L 4.8 4.6 4.4  Chloride 98 - 110 mmol/L 101 105 105  CO2 20 - 32 mmol/L 28 26 25   Calcium 8.6 - 10.2 mg/dL 9.9 9.3 8.7(L)  Total Protein 6.1 - 8.1 g/dL 7.1 6.9 6.8  Total Bilirubin 0.2 - 1.2 mg/dL 0.5 0.4 0.3  Alkaline Phos 38 - 126 U/L - 71 79  AST 10 - 35 U/L 18 22 15   ALT 6 - 29 U/L 19 18 22    CBC Latest Ref Rng & Units 12/29/2018 11/30/2018 09/29/2018  WBC 3.8 - 10.8 Thousand/uL 5.8 6.8 5.4  Hemoglobin 11.7 - 15.5 g/dL 12.0 11.8 10.0(L)  Hematocrit 35.0 - 45.0 % 39.1 36.7 33.0(L)  Platelets 140 - 400 Thousand/uL 294 326 329   Lipid Panel     Component Value Date/Time   CHOL 177  11/30/2018 0921   TRIG 217 (H) 11/30/2018 0921   HDL 46 (L) 11/30/2018 0921   CHOLHDL 3.8 11/30/2018 0921   VLDL 59 (H) 02/17/2018 1045   LDLCALC 97 11/30/2018 0921   HEMOGLOBIN A1C Lab Results  Component Value Date   HGBA1C 6.3 (H) 11/30/2018   MPG 134 11/30/2018   TSH Recent Labs    02/17/18 1045 08/18/18 0900 11/30/18 0921  TSH 1.250 0.302* 1.53   Medications   Prior to Admission medications   Medication Sig Start Date End Date Taking? Authorizing Provider  carvedilol (COREG) 3.125 MG tablet TAKE 1 TABLET TWICE A DAY 07/20/18   Bensimhon, Shaune Pascal, MD  diphenoxylate-atropine (LOMOTIL) 2.5-0.025 MG tablet Take 1 tablet by mouth 4 (four) times daily as needed for diarrhea or loose stools. 03/31/18   Nicholas Lose, MD  esomeprazole (NEXIUM) 40 MG capsule TAKE 1 CAPSULE (40 MG TOTAL) BY MOUTH DAILY AT 12 NOON. 06/03/18   Nicholas Lose, MD  levothyroxine (SYNTHROID, LEVOTHROID) 137 MCG tablet Take 137 mcg by mouth daily before breakfast.    [provider]  LORazepam (ATIVAN) 1 MG tablet One po bid prn 12/02/18   Elby Showers, MD  losartan (COZAAR) 25 MG tablet TAKE 1 TABLET BY MOUTH EVERY DAY 07/20/18   Bensimhon, Shaune Pascal, MD  meclizine (ANTIVERT) 12.5 MG tablet Take 1 tablet (12.5 mg total) by mouth 3 (three) times daily as needed for dizziness. 10/26/18   Nicholas Lose, MD  metFORMIN (GLUCOPHAGE) 500 MG tablet Take by mouth daily with breakfast.    [provider]  ondansetron (ZOFRAN ODT) 4 MG disintegrating tablet Take 1 tablet (4 mg total) by mouth every 8 (eight) hours as needed for nausea or vomiting. 08/18/18   Nicholas Lose, MD  rosuvastatin (CRESTOR) 20 MG tablet Take 1 tablet (20 mg total) by mouth daily. 05/15/18 08/13/18  Bensimhon, Shaune Pascal, MD  triamterene-hydrochlorothiazide (MAXZIDE) 75-50 MG tablet TAKE 1 TABLET BY MOUTH DAILY FOR EDEMA 11/06/18   Nicholas Lose, MD  venlafaxine XR (EFFEXOR XR) 75 MG 24 hr capsule  Take 1 capsule (75 mg total) by mouth  daily with breakfast. 12/02/18   Baxley, Cresenciano Lick, MD     Current Outpatient Medications  Medication Instructions  . carvedilol (COREG) 6.25 mg, Oral, 2 times daily  . Cholecalciferol ([employer] D3) 50 MCG (2000 UT) CAPS Oral  . diphenoxylate-atropine (LOMOTIL) 2.5-0.025 MG tablet 1 tablet, Oral, 4 times daily PRN  . esomeprazole (NEXIUM) 40 mg, Oral, Daily  . ferrous sulfate 325 mg, Oral, Daily with breakfast  . levothyroxine (SYNTHROID) 125 mcg, Oral, Daily before breakfast  . LORazepam (ATIVAN) 1 MG tablet One po bid prn  . meclizine (ANTIVERT) 12.5 mg, Oral, 3 times daily PRN  . metFORMIN (GLUCOPHAGE) 500 mg, Oral, 2 times daily with meals  . ondansetron (ZOFRAN ODT) 4 mg, Oral, Every 8 hours PRN  . rosuvastatin (CRESTOR) 20 mg, Oral, Daily  . sacubitril-valsartan (ENTRESTO) 49-51 MG 1 tablet, Oral, 2 times daily  . triamterene-hydrochlorothiazide (MAXZIDE) 75-50 MG tablet TAKE 1 TABLET BY MOUTH DAILY FOR EDEMA  . venlafaxine XR (EFFEXOR XR) 75 mg, Oral, Daily with breakfast    Cardiac Studies:   Echocardiogram 12/25/2018:    1. The left ventricle has moderately reduced systolic function, with an ejection fraction of 35-40%. The cavity size was normal. There is mildly increased left ventricular wall thickness. Left ventricular diastolic function could not be evaluated due to  nondiagnostic images. Left ventricular diffuse hypokinesis.  2. The average left ventricular global longitudinal strain is -12.8 %.  3. The right ventricle has normal systolic function. The cavity was normal. There is no increase in right ventricular wall thickness. Right ventricular systolic pressure could not be assessed.  4. Left atrial size was mildly dilated.  5. There is mild to moderate mitral annular calcification present.  6. Compared to prior study, global longitudinal strain is -12.8 and was -15.3 on 08/20/2018 and -18.6 in Jan 2020 and may suggest mild decrease in conractility compared to prior exam. The LVF  appears to have declined further as well.  Assessment     ICD-10-CM   1. 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 LBBB (left bundle branch block)  I44.7 CT CORONARY MORPH W/CTA COR W/SCORE W/CA W/CM &/OR WO/CM    CT CORONARY FRACTIONAL FLOW RESERVE DATA PREP    CT CORONARY FRACTIONAL FLOW RESERVE FLUID ANALYSIS  3. Dyspnea on exertion  R06.09 CT CORONARY MORPH W/CTA COR W/SCORE W/CA W/CM &/OR WO/CM    CT CORONARY FRACTIONAL FLOW RESERVE DATA PREP    CT CORONARY FRACTIONAL FLOW RESERVE FLUID ANALYSIS  4. Coronary artery calcification  I25.10 EKG 12-Lead   I25.84 CT CORONARY MORPH W/CTA COR W/SCORE W/CA W/CM &/OR WO/CM    CT CORONARY FRACTIONAL FLOW RESERVE DATA PREP    CT CORONARY FRACTIONAL FLOW RESERVE FLUID ANALYSIS  5. Malignant neoplasm of upper-outer quadrant of right breast in female, estrogen receptor negative ([place])  C50.411    Z17.1   6. Shortness of breath  R06.02 CT CORONARY MORPH W/CTA COR W/SCORE W/CA W/CM &/OR WO/CM    CT CORONARY FRACTIONAL FLOW RESERVE DATA PREP    CT CORONARY FRACTIONAL FLOW RESERVE FLUID ANALYSIS    Discontinue Losartan  1. Neoadjuvant chemotherapy with TCH Perjeta 6 cycles followed by Herceptinand Perjetamaintenance for 1 year 2. Followed bybilateral mastectomies  with targeted node dissection on the right7/23/2019 3.Due to significant residual disease, additional adjuvant chemo with Adriamycin and Cytoxan 12/18/2017-01/27/2018 4.Herceptin maintenance completed 09/29/2018.  - Dr. Lupe Carney, MD (Onc)  EKG 01/13/2019: Normal sinus rhythm at rate of 83 bpm, left atrial abnormality, left bundle branch block.  No further analysis. No significant change from  From prior EKG.   Recommendations:   Patient referred by Dr. Sonny Dandy ([facility]) for evaluation of post  chemotherapy cardiomyopathy.  Patient is a mild dyspnea but there is no clinical evidence of heart failure.  I reviewed available medical records, she has mixed hyperlipidemia, hypertension, diabetes mellitus and his cardiovascular risk factors as well and also recent CT scans of the chest had revealed aortic atherosclerosis and also prior coronary CTA calcium score was abnormal.  In view of this, underlying coronary artery disease cannot be excluded as well although most probably her cardiomyopathy may be coming from chemotherapy(Herceptin). Vascular risk factors include obesity, hypertension, DM and hyperlipidemia.   Coronary CTA ordered today.  Will increase carvedilol from 3.125 mg to 6.25 mg p.o. b.i.d.  4-5 days after increasing the dose, she will start Entresto 49/51 mg b.i.d. and discontinued losartan. Goal to decrease her heart rate first and then target neuroendocrine access.  She will need CMP 2 weeks after she started the medication.  I'll like to see her back in 6 weeks for follow-up.  I have discussed with her regarding making lifestyle changes. Weight loss discussed.   Adrian Prows, MD, [facility] 01/14/2019, 10:05 PM [facility] [place] Pager: [phone] Office: [phone] If no answer Cell [phone]

## 2019-01-13 NOTE — Telephone Encounter (Signed)
CTA instructions sent via mychart message

## 2019-01-18 ENCOUNTER — Other Ambulatory Visit: Payer: Self-pay | Admitting: Cardiology

## 2019-01-18 DIAGNOSIS — I10 Essential (primary) hypertension: Secondary | ICD-10-CM

## 2019-01-18 DIAGNOSIS — I427 Cardiomyopathy due to drug and external agent: Secondary | ICD-10-CM

## 2019-01-19 ENCOUNTER — Other Ambulatory Visit (HOSPITAL_COMMUNITY): Payer: Self-pay | Admitting: Internal Medicine

## 2019-01-19 ENCOUNTER — Telehealth (HOSPITAL_COMMUNITY): Payer: Self-pay | Admitting: Emergency Medicine

## 2019-01-19 NOTE — Telephone Encounter (Signed)
From pt

## 2019-01-19 NOTE — Telephone Encounter (Signed)
Reaching out to patient to offer assistance regarding upcoming cardiac imaging study; pt verbalizes understanding of appt date/time, parking situation and where to check in, pre-test NPO status and medications ordered, and verified current allergies; name and call back number provided for further questions should they arise Chavon Lucarelli RN Navigator Cardiac Imaging [facility] [phone] office [phone] cell 

## 2019-01-20 LAB — BRAIN NATRIURETIC PEPTIDE: BNP: 31.4 pg/mL (ref 0.0–100.0)

## 2019-01-20 LAB — BASIC METABOLIC PANEL
BUN/Creatinine Ratio: 26 — ABNORMAL HIGH (ref 9–23)
BUN: 27 mg/dL — ABNORMAL HIGH (ref 6–24)
CO2: 26 mmol/L (ref 20–29)
Calcium: 9.7 mg/dL (ref 8.7–10.2)
Chloride: 100 mmol/L (ref 96–106)
Creatinine, Ser: 1.02 mg/dL — ABNORMAL HIGH (ref 0.57–1.00)
GFR calc Af Amer: 75 mL/min/{1.73_m2} (ref >59)
GFR calc non Af Amer: 65 mL/min/{1.73_m2} (ref >59)
Glucose: 115 mg/dL — ABNORMAL HIGH (ref 65–99)
Potassium: 4.9 mmol/L (ref 3.5–5.2)
Sodium: 139 mmol/L (ref 134–144)

## 2019-01-21 ENCOUNTER — Other Ambulatory Visit: Payer: Self-pay

## 2019-01-21 ENCOUNTER — Ambulatory Visit (HOSPITAL_COMMUNITY)
Admission: RE | Admit: 2019-01-21 | Discharge: 2019-01-21 | Disposition: A | Discharge status: Home / Self Care | Payer: BC Managed Care – PPO | Source: Ambulatory Visit | Attending: Cardiology | Admitting: Cardiology

## 2019-01-21 DIAGNOSIS — I427 Cardiomyopathy due to drug and external agent: Secondary | ICD-10-CM

## 2019-01-21 DIAGNOSIS — I447 Left bundle-branch block, unspecified: Secondary | ICD-10-CM | POA: Insufficient documentation

## 2019-01-21 DIAGNOSIS — I2510 Atherosclerotic heart disease of native coronary artery without angina pectoris: Secondary | ICD-10-CM | POA: Diagnosis present

## 2019-01-21 DIAGNOSIS — R0602 Shortness of breath: Secondary | ICD-10-CM | POA: Diagnosis not present

## 2019-01-21 DIAGNOSIS — R0609 Other forms of dyspnea: Secondary | ICD-10-CM | POA: Insufficient documentation

## 2019-01-21 DIAGNOSIS — I2584 Coronary atherosclerosis due to calcified coronary lesion: Secondary | ICD-10-CM | POA: Insufficient documentation

## 2019-01-21 IMAGING — CT CT HEART MORP W/ CTA COR W/ SCORE W/ CA W/CM &/OR W/O CM
4 of 7 series · 8 of 20 positions shown, 9 images · non-contrast
Comparison: Chest CT [DATE].  Cardiac CT [DATE].
COMPARISON: Chest CT [DATE].  Cardiac CT [DATE].

Addendum:
EXAM:
OVER-READ INTERPRETATION  CT CHEST

The following report is an over-read performed by radiologist Dr.
SANDISILE [REDACTED] on [DATE]. This
over-read does not include interpretation of cardiac or coronary
anatomy or pathology. The coronary calcium score/coronary CTA
interpretation by the cardiologist is attached.
CLINICAL DATA: Chest pain
Cardiac CTA
MEDICATIONS:
Sub lingual nitro. 4mg x 2
TECHNIQUE: The patient was scanned on a Siemens [REDACTED]ice scanner. Gantry
rotation speed was 250 msecs. Collimation was 0.6 mm. A 100 kV
prospective scan was triggered in the ascending thoracic aorta at
35-75% of the R-R interval. Average HR during the scan was 60 bpm.
The 3D data set was interpreted on a dedicated work station using
MPR, MIP and VRT modes. A total of 80cc of contrast was used.

[Series 6: best diast 69 % · axial · 0.39mm/px · z∈[-190,-142]mm · 2 of 368 slices shown]
[im 123/368  vessel]
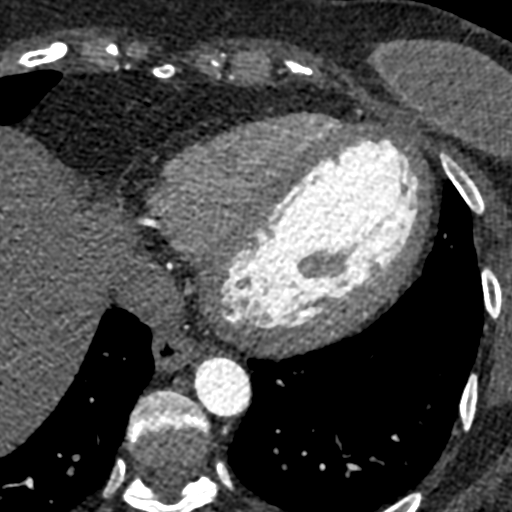
[im 245/368  vessel]
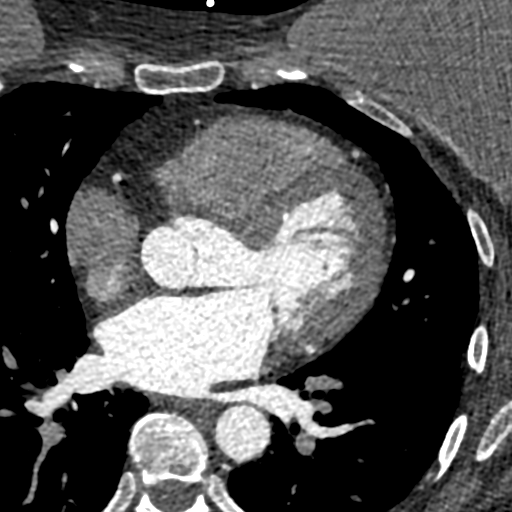

[Series 7: best syst · axial · 0.39mm/px · z∈[-190,-142]mm · 2 of 368 slices shown, 3 images]
[im 123/368  vessel]
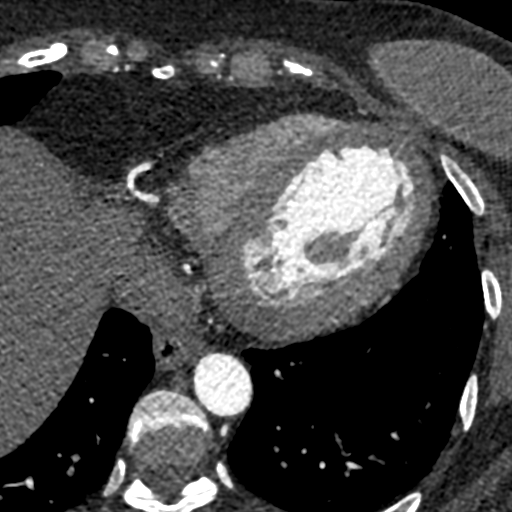
[im 123/368  lung]
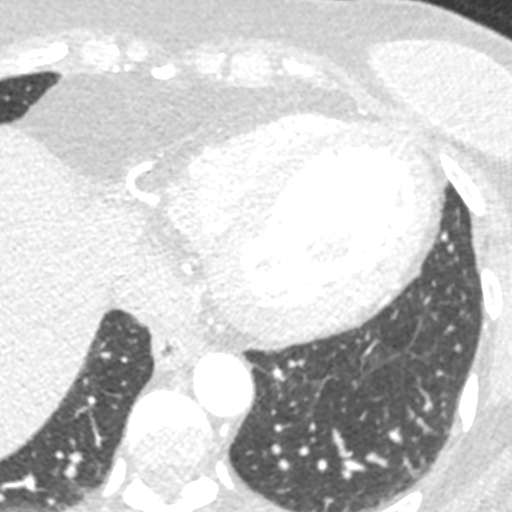
[im 245/368  vessel]
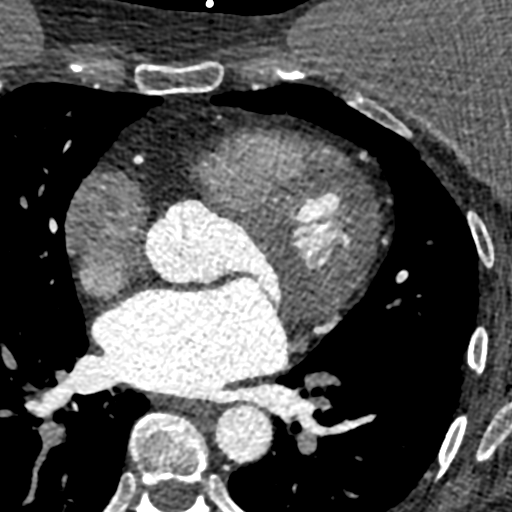

[Series 8: ts diast sharp 69 % · axial · 0.39mm/px · z∈[-190,-142]mm · 2 of 368 slices shown]
[im 123/368  lung]
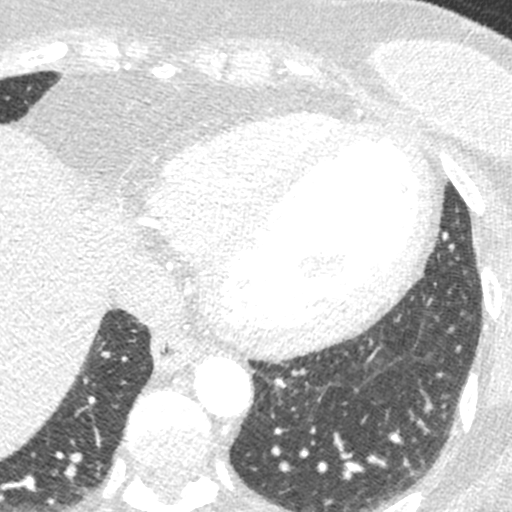
[im 245/368  lung]
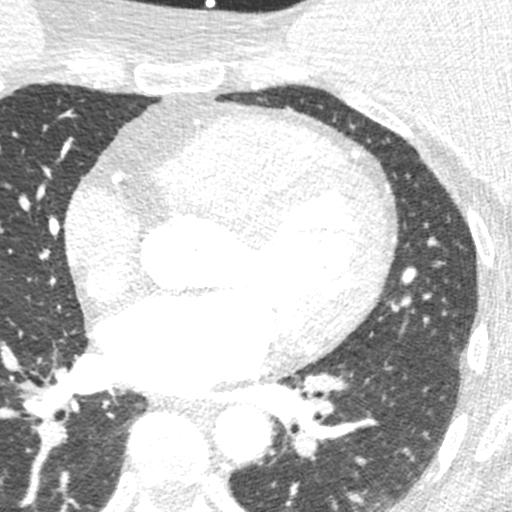

[Series 9: ts syst sharp · axial · 0.39mm/px · z∈[-190,-142]mm · 2 of 368 slices shown]
[im 123/368  lung]
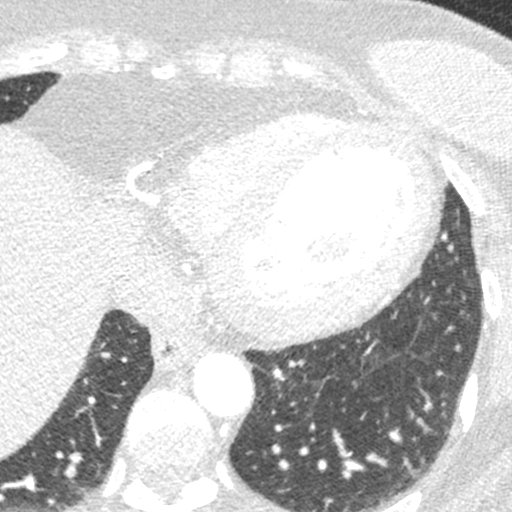
[im 245/368  lung]
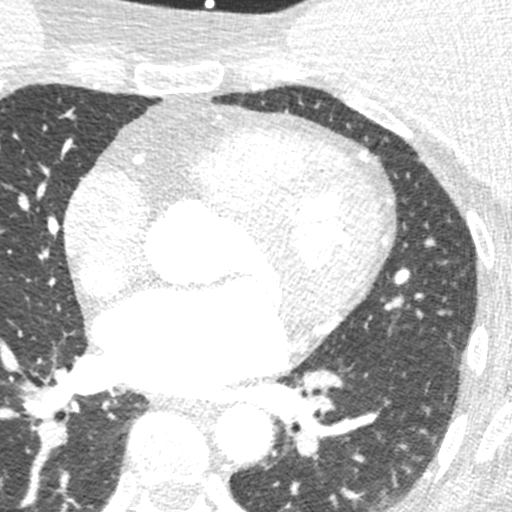

[8 of 20 positions shown; findings below may reference images not displayed]

FINDINGS: In the periphery of the right upper lobe (axial image 7 of series
12) there is a 2 mm pulmonary nodule which is stable dating back to
prior examinations dating back to [DATE], considered
definitively benign. Within the visualized portions of the thorax
there are no other larger more suspicious appearing pulmonary
nodules or masses, there is no acute consolidative airspace disease,
no pleural effusions, no pneumothorax and no lymphadenopathy.
Visualized portions of the upper abdomen are unremarkable. There are
no aggressive appearing lytic or blastic lesions noted in the
visualized portions of the skeleton. Bilateral breast implants are
incidentally noted.
IMPRESSION: No significant incidental noncardiac findings are noted.
FINDINGS: Non-cardiac: See separate report from [REDACTED].

Pulmonary veins drain normally to the left atrium.

Calcium Score: 50 Agatston units.

Coronary Arteries: Right dominant. The RCA originates from the left
cusp in close proximity to the left coronary and courses
interarterially between the aortic root and proximal PA to reach
typical RCA territory.

LM: No plaque or stenosis.

LAD system: Mixed plaque in the proximal LAD with mild (<50%)
stenosis.

Circumflex system: Large PLOM, small AV LCx after take-off of PLOM.
No plaque or stenosis.

RCA system: As above, anomalous RCA off the left cusp in close
proximity to the left coronary origin. The RCA has a narrowed,
slit-like orifice and courses interarterially. No plaque or stenosis
in the RCA.
IMPRESSION: 1. Coronary artery calcium score 50 Agatston units. This places the
patient in the 97th percentile for age and gender, suggesting high
risk for future cardiac events.

2.  Nonobstructive coronary disease.

3. Anomalous RCA as described above. Would consider treadmill stress
testing to assess for provocative ischemia.

SANDISILE

*** End of Addendum ***
EXAM:
OVER-READ INTERPRETATION  CT CHEST

The following report is an over-read performed by radiologist Dr.
SANDISILE [REDACTED] on [DATE]. This
over-read does not include interpretation of cardiac or coronary
anatomy or pathology. The coronary calcium score/coronary CTA
interpretation by the cardiologist is attached.
FINDINGS: In the periphery of the right upper lobe (axial image 7 of series
12) there is a 2 mm pulmonary nodule which is stable dating back to
prior examinations dating back to [DATE], considered
definitively benign. Within the visualized portions of the thorax
there are no other larger more suspicious appearing pulmonary
nodules or masses, there is no acute consolidative airspace disease,
no pleural effusions, no pneumothorax and no lymphadenopathy.
Visualized portions of the upper abdomen are unremarkable. There are
no aggressive appearing lytic or blastic lesions noted in the
visualized portions of the skeleton. Bilateral breast implants are
incidentally noted.
IMPRESSION: No significant incidental noncardiac findings are noted.

## 2019-01-21 MED ORDER — NITROGLYCERIN 0.4 MG SL SUBL
0.8000 mg | SUBLINGUAL_TABLET | Freq: Once | SUBLINGUAL | Status: AC
  Administered 2019-01-21: 08:00:00 0.8 mg via SUBLINGUAL

## 2019-01-21 MED ORDER — METOPROLOL TARTRATE 5 MG/5ML IV SOLN
5.0000 mg | INTRAVENOUS | Status: DC | PRN
  Administered 2019-01-21 (×2): 5 mg via INTRAVENOUS

## 2019-01-21 MED ORDER — METOPROLOL TARTRATE 5 MG/5ML IV SOLN
INTRAVENOUS | Status: AC
  Administered 2019-01-21: 08:00:00 5 mg via INTRAVENOUS
  Filled 2019-01-21: qty 10

## 2019-01-21 MED ORDER — IOHEXOL 350 MG/ML SOLN
80.0000 mL | Freq: Once | INTRAVENOUS | Status: AC | PRN
  Administered 2019-01-21: 80 mL via INTRAVENOUS

## 2019-01-21 MED ORDER — NITROGLYCERIN 0.4 MG SL SUBL
SUBLINGUAL_TABLET | SUBLINGUAL | Status: AC
  Filled 2019-01-21: qty 2

## 2019-01-24 NOTE — Progress Notes (Signed)
   Subjective:    Patient ID: Rebecca [name], female    DOB: Sep 30, 1970, 48 y.o.   MRN: [id number] Is HPI Is being followed by Dr. Nadyne Coombes for chemotherapy-induced cardiomyopathy.  Patient was found to have a low MCV in August.  She is iron deficient.  Vitamin D level has improved from 27 in August to 33. Hemoglobin has improved from 10 g in June to 12 g.  She should continue with iron supplement.  She should continue with vitamin D supplementation.  With regard to anxiety and depression, she is feeling some better with family issues.  She is on Effexor XR 75 mg daily which was started in August and also has prescription for Ativan 1 mg twice daily as needed.  Review of Systems she is status post radioactive iodine treatment for hyperthyroidism by Dr. Chalmers Cater and is on levothyroxine 0.125 mg daily     Objective:   Physical Exam Spent 20 minutes speaking with her about these issues.  She seems less anxious and upset but certainly concerned about her cardiac issue.       Assessment & Plan:  Cardiomyopathy secondary to chemotherapy  Iron deficiency-continue iron supplement  Vitamin D deficiency-continue vitamin D supplement  Anxiety and depression continue Ativan and Effexor.  She will follow-up in 6 weeks.

## 2019-01-24 NOTE — Patient Instructions (Signed)
Continue lorazepam, Effexor, iron supplement and vitamin D supplement.  Follow-up in 6 weeks.

## 2019-01-26 ENCOUNTER — Other Ambulatory Visit: Payer: BC Managed Care – PPO | Admitting: Internal Medicine

## 2019-01-26 NOTE — Telephone Encounter (Signed)
I have discussed with her already over the weekend. Will do GXT at some point

## 2019-01-26 NOTE — Telephone Encounter (Signed)
Please read

## 2019-01-27 ENCOUNTER — Other Ambulatory Visit: Payer: Self-pay | Admitting: Cardiology

## 2019-01-27 DIAGNOSIS — I2510 Atherosclerotic heart disease of native coronary artery without angina pectoris: Secondary | ICD-10-CM

## 2019-01-27 DIAGNOSIS — I2584 Coronary atherosclerosis due to calcified coronary lesion: Secondary | ICD-10-CM

## 2019-01-27 DIAGNOSIS — I10 Essential (primary) hypertension: Secondary | ICD-10-CM

## 2019-01-28 LAB — COMPREHENSIVE METABOLIC PANEL
ALT: 21 IU/L (ref 0–32)
AST: 16 IU/L (ref 0–40)
Albumin/Globulin Ratio: 2 (ref 1.2–2.2)
Albumin: 4.8 g/dL (ref 3.8–4.8)
Alkaline Phosphatase: 92 IU/L (ref 39–117)
BUN/Creatinine Ratio: 18 (ref 9–23)
BUN: 19 mg/dL (ref 6–24)
Bilirubin Total: 0.5 mg/dL (ref 0.0–1.2)
CO2: 26 mmol/L (ref 20–29)
Calcium: 9.9 mg/dL (ref 8.7–10.2)
Chloride: 99 mmol/L (ref 96–106)
Creatinine, Ser: 1.08 mg/dL — ABNORMAL HIGH (ref 0.57–1.00)
GFR calc Af Amer: 70 mL/min/{1.73_m2} (ref >59)
GFR calc non Af Amer: 60 mL/min/{1.73_m2} (ref >59)
Globulin, Total: 2.4 g/dL (ref 1.5–4.5)
Glucose: 122 mg/dL — ABNORMAL HIGH (ref 65–99)
Potassium: 4.9 mmol/L (ref 3.5–5.2)
Sodium: 139 mmol/L (ref 134–144)
Total Protein: 7.2 g/dL (ref 6.0–8.5)

## 2019-01-28 LAB — LIPID PANEL
Chol/HDL Ratio: 3.6 ratio (ref 0.0–4.4)
Cholesterol, Total: 154 mg/dL (ref 100–199)
HDL: 43 mg/dL (ref >39)
LDL Chol Calc (NIH): 84 mg/dL (ref 0–99)
Triglycerides: 158 mg/dL — ABNORMAL HIGH (ref 0–149)
VLDL Cholesterol Cal: 27 mg/dL (ref 5–40)

## 2019-02-11 ENCOUNTER — Ambulatory Visit: Payer: BC Managed Care – PPO | Admitting: Internal Medicine

## 2019-02-11 ENCOUNTER — Other Ambulatory Visit: Payer: Self-pay

## 2019-02-11 ENCOUNTER — Encounter: Payer: Self-pay | Admitting: Internal Medicine

## 2019-02-11 VITALS — BP 90/70 | HR 90 | Temp 98.0°F | Ht 70.0 in | Wt 220.0 lb

## 2019-02-11 DIAGNOSIS — F329 Major depressive disorder, single episode, unspecified: Secondary | ICD-10-CM

## 2019-02-11 DIAGNOSIS — F419 Anxiety disorder, unspecified: Secondary | ICD-10-CM

## 2019-02-11 DIAGNOSIS — F439 Reaction to severe stress, unspecified: Secondary | ICD-10-CM | POA: Diagnosis not present

## 2019-02-11 DIAGNOSIS — D508 Other iron deficiency anemias: Secondary | ICD-10-CM | POA: Diagnosis not present

## 2019-02-11 DIAGNOSIS — F32A Depression, unspecified: Secondary | ICD-10-CM

## 2019-02-11 NOTE — Progress Notes (Signed)
   Subjective:    Patient ID: Rebecca [name], female    DOB: 1970-09-29, 47 y.o.   MRN: [id number]  HPI 48 year old Female for follow up on anxiety depression and situational stress.  Continues to be followed closely by Dr. Einar Gip for hypertension and cardiomyopathy secondary to chemotherapy for breast cancer.  Seems calmer than she did initially in September.  She and her in-laws seem to be getting along a bit better.  She is on thyroid replacement therapy after receiving radioactive iodine treatment for hyperthyroidism by Dr. Michiel Sites.  Effexor was started in August.  She has Ativan to take twice daily as needed.  She was found to be iron deficient in September with iron level 43 and percent saturation 10% with a ferritin of 9.  Would like for patient to continue iron supplement.  She has no new issues.  She asked if she can return in a few weeks for follow-up and I am glad to see her.  I think she is anxious about the holidays and spending time in with her in-laws.    Review of Systems see above     Objective:   Physical Exam Not examined today but spent 15 minutes speaking with her about these issues.       Assessment & Plan:  Anxiety state continue SSRI and antianxiety medication.  She does not want to go to counseling at this point in time.  Family stress  Situational stress  Iron deficiency-continue iron supplement  History of cardiomyopathy from chemotherapy for breast cancer followed by Dr. Nadyne Coombes  Hyperlipidemia followed by Dr. Nadyne Coombes  Follow-up in November.

## 2019-02-11 NOTE — Patient Instructions (Addendum)
Continue current meds and RTC in 6 weeks.

## 2019-02-15 ENCOUNTER — Other Ambulatory Visit: Payer: Self-pay | Admitting: Internal Medicine

## 2019-02-26 ENCOUNTER — Ambulatory Visit: Payer: BC Managed Care – PPO | Admitting: Cardiology

## 2019-02-26 ENCOUNTER — Other Ambulatory Visit: Payer: Self-pay

## 2019-02-26 VITALS — BP 111/70 | HR 71 | Temp 98.1°F | Ht 70.0 in | Wt 217.6 lb

## 2019-02-26 DIAGNOSIS — I427 Cardiomyopathy due to drug and external agent: Secondary | ICD-10-CM | POA: Diagnosis not present

## 2019-02-26 DIAGNOSIS — I10 Essential (primary) hypertension: Secondary | ICD-10-CM | POA: Diagnosis not present

## 2019-02-26 DIAGNOSIS — I2510 Atherosclerotic heart disease of native coronary artery without angina pectoris: Secondary | ICD-10-CM | POA: Diagnosis not present

## 2019-02-26 DIAGNOSIS — I447 Left bundle-branch block, unspecified: Secondary | ICD-10-CM

## 2019-02-26 DIAGNOSIS — I2584 Coronary atherosclerosis due to calcified coronary lesion: Secondary | ICD-10-CM

## 2019-02-26 DIAGNOSIS — Q245 Malformation of coronary vessels: Secondary | ICD-10-CM

## 2019-02-26 MED ORDER — CARVEDILOL 6.25 MG PO TABS: 9.7500 mg | ORAL_TABLET | Freq: Two times a day (BID) | ORAL | 1 refills | Status: DC

## 2019-02-26 NOTE — Progress Notes (Signed)
Primary Physician/Referring:  Elby Showers, MD  Patient ID: Rebecca [name], female    DOB: 1971/03/14, 48 y.o.   MRN: [id number]  Chief Complaint  Patient presents with  . Cardiomyopathy  . Follow-up   HPI:    Rebecca SASNETT  is a 48 y.o. Caucasian female initially with history of LBBB at least dating back to 2013. At that time she has had a negative nuclear stress test and echocardiogram revealed low normal LVEF at 45-50%. Past medical history includes hyperthyroidism S/P RAI therapy in March 2018, depression, chronic low back pain, estrogen negative right breast cancer for which he underwent chemotherapy(Adjuvant chemotherapy with dose dense Adriamycin and Cytoxan x4)  and also bilateral mastectomy with reconstruction surgery on 11/18/2017. Surveillance echocardiogram was chemotherapy attributed gradual decrease of LVEF to moderate LV systolic dysfunction of 35 to 40%.    She has chronic dyspnea on exertion which is stable. She underwent coronary CTA and presents for follow-up.  On her last office visit I had started her on Entresto and also increased Coreg which is tolerating.  She has lost 10 pounds in weight since last office visit 6 weeks ago.  Past Medical History:  Diagnosis Date  . Anxiety   . Breast cancer ([place])   . Diabetes mellitus without complication ([place])   . Diverticulitis   . GERD (gastroesophageal reflux disease)   . Hiatal hernia   . Hypertension   . Hypothyroidism   . LBBB (left bundle branch block)    Past Surgical History:  Procedure Laterality Date  . BREAST RECONSTRUCTION WITH PLACEMENT OF TISSUE EXPANDER AND ALLODERM Bilateral 11/18/2017   Procedure: BILATERAL BREAST RECONSTRUCTION WITH PLACEMENT OF TISSUE EXPANDER AND ALLODERM;  Surgeon: Irene Limbo, MD;  Location: [facility];  Service: Plastics;  Laterality: Bilateral;  . CESAREAN SECTION  2007/2010   x2  . LAPAROSCOPIC TUBAL LIGATION  06/12/2011   Procedure: LAPAROSCOPIC  TUBAL LIGATION;  Surgeon: Daria Pastures, MD;  Location: [facility];  Service: Gynecology;  Laterality: N/A;  filshie clip  . LIPOSUCTION WITH LIPOFILLING Bilateral 02/24/2018   Procedure: LIPOFILLING FROM ABDOMEN TO BILATERAL CHEST;  Surgeon: Irene Limbo, MD;  Location: [facility];  Service: Plastics;  Laterality: Bilateral;  . MASTECTOMY W/ SENTINEL NODE BIOPSY Bilateral 11/18/2017   Procedure: BILATERAL TOTAL MASTECTOMIES WITH RIGHT SENTINEL LYMPH NODE BIOPSY;  Surgeon: Rolm Bookbinder, MD;  Location: [facility];  Service: General;  Laterality: Bilateral;  . mini tuck  2012   abdominal  . PORT-A-CATH REMOVAL Right 01/05/2019  . PORTACATH PLACEMENT Right 07/15/2017   Procedure: INSERTION PORT-A-CATH WITH ULTRASOUND;  Surgeon: Rolm Bookbinder, MD;  Location: [facility];  Service: General;  Laterality: Right;  . REMOVAL OF BILATERAL TISSUE EXPANDERS WITH PLACEMENT OF BILATERAL BREAST IMPLANTS Bilateral 02/24/2018   Procedure: REMOVAL OF BILATERAL TISSUE EXPANDERS WITH PLACEMENT OF BILATERAL BREAST IMPLANTS;  Surgeon: Irene Limbo, MD;  Location: [facility];  Service: Plastics;  Laterality: Bilateral;  . right breast cancer     upper-outer quadrant   . right ear surg  03/2008   Mohs  . TUBAL LIGATION    . US ECHOCARDIOGRAPHY  12-30-2007   EF 55-60%  . WISDOM TOOTH EXTRACTION     Social History   Socioeconomic History  . Marital status: Married    Spouse name: Not on file  . Number of children: 2  . Years of education: Not on file  . Highest education level: Not  on file  Occupational History  . Occupation: Neurosurgeon  . Financial resource strain: Not on file  . Food insecurity    Worry: Not on file    Inability: Not on file  . Transportation needs    Medical: Not on file    Non-medical: Not on file  Tobacco Use  . Smoking status: Former Smoker    Packs/day: 0.25    Years: 6.00    Pack  years: 1.50    Types: Cigarettes    Quit date: 03/29/2005    Years since quitting: 13.9  . Smokeless tobacco: Never Used  Substance and Sexual Activity  . Alcohol use: Yes    Comment: socially  . Drug use: No  . Sexual activity: Yes    Birth control/protection: Surgical    Comment: BTL  Lifestyle  . Physical activity    Days per week: Not on file    Minutes per session: Not on file  . Stress: Not on file  Relationships  . Social Herbalist on phone: Not on file    Gets together: Not on file    Attends religious service: Not on file    Active member of club or organization: Not on file    Attends meetings of clubs or organizations: Not on file    Relationship status: Not on file  . Intimate partner violence    Fear of current or ex partner: Not on file    Emotionally abused: Not on file    Physically abused: Not on file    Forced sexual activity: Not on file  Other Topics Concern  . Not on file  Social History Narrative   ** Merged History Encounter **       ROS  Review of Systems  Constitution: Negative for chills, decreased appetite, malaise/fatigue and weight gain.  Cardiovascular: Positive for dyspnea on exertion and palpitations. Negative for leg swelling and syncope.  Endocrine: Negative for cold intolerance.  Hematologic/Lymphatic: Does not bruise/bleed easily.  Musculoskeletal: Negative for joint swelling.  Gastrointestinal: Negative for abdominal pain, anorexia, change in bowel habit, hematochezia and melena.  Neurological: Negative for headaches and light-headedness.  Psychiatric/Behavioral: Negative for depression and substance abuse.  All other systems reviewed and are negative.  Objective   Vitals with BMI 02/26/2019 02/11/2019 01/21/2019  Height 5\' 10"  5\' 10"  -  Weight 217 lbs 10 oz 220 lbs -  BMI [id number] -  Systolic [id number] 90 [id number]  Diastolic 70 70 60  Pulse 71 90 64    Blood pressure 111/70, pulse 71, temperature 98.1 F (36.7 C),  height 5\' 10"  (1.778 m), weight 217 lb 9.6 oz (98.7 kg), SpO2 98 %. Body mass index is 31.22 kg/m.   Physical Exam  Constitutional:  Well built and mildly obese in no acute distress  HENT:  Head: Atraumatic.  Eyes: Conjunctivae are normal.  Neck: Neck supple. No JVD present. No thyromegaly present.  Cardiovascular: Normal rate, regular rhythm, normal heart sounds and intact distal pulses. Exam reveals no gallop.  No murmur heard. Pulmonary/Chest: Effort normal and breath sounds normal.  Abdominal: Soft. Bowel sounds are normal.  Musculoskeletal: Normal range of motion.  Neurological: She is alert.  Skin: Skin is warm and dry.  Psychiatric: She has a normal mood and affect.   Radiology: No results found.  Laboratory examination:   Recent Labs    11/30/18 0921 01/19/19 0835 01/27/19 0820  NA 139 139 139  K 4.8 4.9  4.9  CL 101 100 99  CO2 28 26 26   GLUCOSE 106* 115* 122*  BUN 17 27* 19  CREATININE 0.78 1.02* 1.08*  CALCIUM 9.9 9.7 9.9  GFRNONAA 90 65 60  GFRAA 104 75 70   CMP Latest Ref Rng & Units 01/27/2019 01/19/2019 11/30/2018  Glucose 65 - 99 mg/dL 122(H) 115(H) 106(H)  BUN 6 - 24 mg/dL 19 27(H) 17  Creatinine 0.57 - 1.00 mg/dL 1.08(H) 1.02(H) 0.78  Sodium 134 - 144 mmol/L 139 139 139  Potassium 3.5 - 5.2 mmol/L 4.9 4.9 4.8  Chloride 96 - 106 mmol/L 99 100 101  CO2 20 - 29 mmol/L 26 26 28   Calcium 8.7 - 10.2 mg/dL 9.9 9.7 9.9  Total Protein 6.0 - 8.5 g/dL 7.2 - 7.1  Total Bilirubin 0.0 - 1.2 mg/dL 0.5 - 0.5  Alkaline Phos 39 - 117 IU/L 92 - -  AST 0 - 40 IU/L 16 - 18  ALT 0 - 32 IU/L 21 - 19   CBC Latest Ref Rng & Units 12/29/2018 11/30/2018 09/29/2018  WBC 3.8 - 10.8 Thousand/uL 5.8 6.8 5.4  Hemoglobin 11.7 - 15.5 g/dL 12.0 11.8 10.0(L)  Hematocrit 35.0 - 45.0 % 39.1 36.7 33.0(L)  Platelets 140 - 400 Thousand/uL 294 326 329   Lipid Panel     Component Value Date/Time   CHOL 154 01/27/2019 0820   TRIG 158 (H) 01/27/2019 0820   HDL 43 01/27/2019 0820    CHOLHDL 3.6 01/27/2019 0820   CHOLHDL 3.8 11/30/2018 0921   VLDL 59 (H) 02/17/2018 1045   LDLCALC 84 01/27/2019 0820   LDLCALC 97 11/30/2018 0921   HEMOGLOBIN A1C Lab Results  Component Value Date   HGBA1C 6.3 (H) 11/30/2018   MPG 134 11/30/2018   TSH Recent Labs    08/18/18 0900 11/30/18 0921  TSH 0.302* 1.53   Medications   Prior to Admission medications   Medication Sig Start Date End Date Taking? Authorizing Provider  carvedilol (COREG) 3.125 MG tablet TAKE 1 TABLET TWICE A DAY 07/20/18   Bensimhon, Shaune Pascal, MD  diphenoxylate-atropine (LOMOTIL) 2.5-0.025 MG tablet Take 1 tablet by mouth 4 (four) times daily as needed for diarrhea or loose stools. 03/31/18   Nicholas Lose, MD  esomeprazole (NEXIUM) 40 MG capsule TAKE 1 CAPSULE (40 MG TOTAL) BY MOUTH DAILY AT 12 NOON. 06/03/18   Nicholas Lose, MD  levothyroxine (SYNTHROID, LEVOTHROID) 137 MCG tablet Take 137 mcg by mouth daily before breakfast.    [provider]  LORazepam (ATIVAN) 1 MG tablet One po bid prn 12/02/18   Elby Showers, MD  losartan (COZAAR) 25 MG tablet TAKE 1 TABLET BY MOUTH EVERY DAY 07/20/18   Bensimhon, Shaune Pascal, MD  meclizine (ANTIVERT) 12.5 MG tablet Take 1 tablet (12.5 mg total) by mouth 3 (three) times daily as needed for dizziness. 10/26/18   Nicholas Lose, MD  metFORMIN (GLUCOPHAGE) 500 MG tablet Take by mouth daily with breakfast.    [provider]  ondansetron (ZOFRAN ODT) 4 MG disintegrating tablet Take 1 tablet (4 mg total) by mouth every 8 (eight) hours as needed for nausea or vomiting. 08/18/18   Nicholas Lose, MD  rosuvastatin (CRESTOR) 20 MG tablet Take 1 tablet (20 mg total) by mouth daily. 05/15/18 08/13/18  Bensimhon, Shaune Pascal, MD  triamterene-hydrochlorothiazide (MAXZIDE) 75-50 MG tablet TAKE 1 TABLET BY MOUTH DAILY FOR EDEMA 11/06/18   Nicholas Lose, MD  venlafaxine XR (EFFEXOR XR) 75 MG 24 hr capsule Take 1 capsule (  75 mg total) by mouth daily with breakfast. 12/02/18   Baxley, Cresenciano Lick, MD     Current Outpatient Medications  Medication Instructions  . carvedilol (COREG) 9.375 mg, Oral, 2 times daily  . Cholecalciferol ([employer] D3) 50 MCG (2000 UT) CAPS Oral  . diphenoxylate-atropine (LOMOTIL) 2.5-0.025 MG tablet 1 tablet, Oral, 4 times daily PRN  . esomeprazole (NEXIUM) 40 mg, Oral, Daily  . Farxiga 10 mg, Oral, Daily  . ferrous sulfate 325 mg, Oral, Daily with breakfast  . levothyroxine (SYNTHROID) 125 mcg, Oral, Daily before breakfast  . LORazepam (ATIVAN) 1 MG tablet One po bid prn  . meclizine (ANTIVERT) 12.5 mg, Oral, 3 times daily PRN  . ondansetron (ZOFRAN ODT) 4 mg, Oral, Every 8 hours PRN  . rosuvastatin (CRESTOR) 20 mg, Oral, Daily  . sacubitril-valsartan (ENTRESTO) 49-51 MG 1 tablet, Oral, 2 times daily  . triamterene-hydrochlorothiazide (MAXZIDE) 75-50 MG tablet TAKE 1 TABLET BY MOUTH DAILY FOR EDEMA  . venlafaxine XR (EFFEXOR-XR) 75 mg, Oral, Daily with breakfast    Cardiac Studies:   Echocardiogram 12/25/2018:    1. The left ventricle has moderately reduced systolic function, with an ejection fraction of 35-40%. The cavity size was normal. There is mildly increased left ventricular wall thickness. Left ventricular diastolic function could not be evaluated due to  nondiagnostic images. Left ventricular diffuse hypokinesis.  2. The average left ventricular global longitudinal strain is -12.8 %.  3. The right ventricle has normal systolic function. The cavity was normal. There is no increase in right ventricular wall thickness. Right ventricular systolic pressure could not be assessed.  4. Left atrial size was mildly dilated.  5. There is mild to moderate mitral annular calcification present.  6. Compared to prior study, global longitudinal strain is -12.8 and was -15.3 on 08/20/2018 and -18.6 in Jan 2020 and may suggest mild decrease in conractility compared to prior exam. The LVF appears to have declined further as well.  Coronary CTA 01/01/2019: 1.  Coronary artery calcium score 50 Agatston units. This places the patient in the 97th percentile for age and gender, suggesting high risk for future cardiac events. 2.  Nonobstructive coronary disease. 3. The RCA originates from the left cusp in close proximity to the left coronary and courses interarterially between the aortic root and proximal PA to reach typical RCA territory. Would consider treadmill stress testing to assess for provocative ischemia. 4. LAD system: Mixed plaque in the proximal LAD with mild (<50%) stenosis. Circumflex system: Large PLOM, small AV LCx after take-off of PLOM. No plaque or stenosis.  Assessment     ICD-10-CM   1. Essential hypertension  I10 PCV ECHOCARDIOGRAM COMPLETE  2. Coronary artery calcification  I25.10    I25.84   3. 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 Neoadjuvant chemotherapy with TCH Perjeta 6 cycles followed by Herceptinand Perjetamaintenance for 1 year 2. Followed bybilateral mastectomies with targeted node dissection on the right7/23/2019 3.Due to significant residual disease, additional adjuvant chemo with Adriamycin and Cytoxan 12/18/2017-01/27/2018 4.Herceptin maintenance completed 09/29/2018.  - Dr. Lupe Carney, MD (Onc)  EKG 01/13/2019: Normal sinus rhythm at rate of 83 bpm, left atrial abnormality, left bundle branch block.  No further analysis. No significant change from  From prior EKG.  Recommendations:   Patient referred by Dr. Lindi Adie ([facility]) for evaluation of post chemotherapy cardiomyopathy.  Patient is a mild dyspnea but there is no clinical evidence of heart failure. Cardiac risk factors include mixed hyperlipidemia, hypertension, diabetes mellitus, aortic atherosclerosis and also mild coronary  atherosclerosis by recent coronary CTA. She has anamalous RCA and needs GXT to evaluate for inducible ischemia.   Will increase carvedilol from 6.25 mg p.o. b.i.d. to 12.5 mg BID gradually, will increase Entresto to maximum dose of  97/103 mg b.i.d. once she tolerates Coreg. Goal to decrease her heart rate first and then target neuroendocrine axis.  I'll like to see her back in 4-6 weeks for follow-up.  I have discussed with her regarding making lifestyle changes. Weight loss discussed. She has lost 10 Lbs in past 6 weeks. LDL is improved, and will continue to reinforce lifestyle change to improve TG. Echo in Dec 2020 will be scheduled.  Adrian Prows, MD, [facility] 02/27/2019, 1:29 PM [facility] Pager: [phone] Office: [phone] If no answer Cell [phone]  CC: Nicholas Lose, MD

## 2019-02-27 ENCOUNTER — Encounter: Payer: Self-pay | Admitting: Cardiology

## 2019-02-27 MED ORDER — LORAZEPAM 1 MG PO TABS: ORAL_TABLET | ORAL | 3 refills | Status: DC

## 2019-02-27 MED ORDER — VENLAFAXINE HCL ER 75 MG PO CP24: 75.0000 mg | ORAL_CAPSULE | Freq: Every day | ORAL | 0 refills | Status: DC

## 2019-03-04 NOTE — Telephone Encounter (Signed)
Please read

## 2019-03-09 NOTE — Telephone Encounter (Signed)
Please read

## 2019-03-11 ENCOUNTER — Other Ambulatory Visit: Payer: Self-pay

## 2019-03-11 ENCOUNTER — Telehealth: Payer: Self-pay

## 2019-03-11 DIAGNOSIS — C50411 Malignant neoplasm of upper-outer quadrant of right female breast: Secondary | ICD-10-CM

## 2019-03-11 NOTE — Telephone Encounter (Signed)
RN scheduled patient for labs prior to CT scan. Pt requesting TSH and A1C to be drawn, OK per MD.    Pt requesting that Echocardiogram be cancelled on 11/30.  Pt is under care of cardiologist, Dr. Einar Gip, and will have echocardiogram done in office in December 2020.

## 2019-03-12 ENCOUNTER — Other Ambulatory Visit: Payer: Self-pay | Admitting: Cardiology

## 2019-03-14 ENCOUNTER — Other Ambulatory Visit: Payer: Self-pay | Admitting: Hematology and Oncology

## 2019-03-19 ENCOUNTER — Inpatient Hospital Stay: Payer: BC Managed Care – PPO | Attending: Hematology and Oncology

## 2019-03-19 ENCOUNTER — Other Ambulatory Visit: Payer: Self-pay

## 2019-03-19 ENCOUNTER — Ambulatory Visit: Payer: BC Managed Care – PPO | Admitting: Internal Medicine

## 2019-03-19 DIAGNOSIS — Z9013 Acquired absence of bilateral breasts and nipples: Secondary | ICD-10-CM | POA: Diagnosis not present

## 2019-03-19 DIAGNOSIS — Z79899 Other long term (current) drug therapy: Secondary | ICD-10-CM | POA: Insufficient documentation

## 2019-03-19 DIAGNOSIS — Z171 Estrogen receptor negative status [ER-]: Secondary | ICD-10-CM | POA: Insufficient documentation

## 2019-03-19 DIAGNOSIS — Z9221 Personal history of antineoplastic chemotherapy: Secondary | ICD-10-CM | POA: Diagnosis not present

## 2019-03-19 DIAGNOSIS — C50411 Malignant neoplasm of upper-outer quadrant of right female breast: Secondary | ICD-10-CM | POA: Diagnosis not present

## 2019-03-19 LAB — CMP (CANCER CENTER ONLY)
ALT: 26 U/L (ref 0–44)
AST: 20 U/L (ref 15–41)
Albumin: 4.3 g/dL (ref 3.5–5.0)
Alkaline Phosphatase: 82 U/L (ref 38–126)
Anion gap: 10 (ref 5–15)
BUN: 20 mg/dL (ref 6–20)
CO2: 26 mmol/L (ref 22–32)
Calcium: 9.1 mg/dL (ref 8.9–10.3)
Chloride: 102 mmol/L (ref 98–111)
Creatinine: 1.05 mg/dL — ABNORMAL HIGH (ref 0.44–1.00)
GFR, Est AFR Am: >60 mL/min (ref >60)
GFR, Estimated: >60 mL/min (ref >60)
Glucose, Bld: 114 mg/dL — ABNORMAL HIGH (ref 70–99)
Potassium: 4.6 mmol/L (ref 3.5–5.1)
Sodium: 138 mmol/L (ref 135–145)
Total Bilirubin: 0.3 mg/dL (ref 0.3–1.2)
Total Protein: 7.3 g/dL (ref 6.5–8.1)

## 2019-03-19 LAB — TSH: TSH: 0.087 u[IU]/mL — ABNORMAL LOW (ref 0.308–3.960)

## 2019-03-22 LAB — HEMOGLOBIN A1C
Hgb A1c MFr Bld: 6.4 % — ABNORMAL HIGH (ref 4.8–5.6)
Mean Plasma Glucose: 136.98 mg/dL

## 2019-03-29 ENCOUNTER — Ambulatory Visit (HOSPITAL_COMMUNITY)
Admission: RE | Admit: 2019-03-29 | Discharge: 2019-03-29 | Disposition: A | Discharge status: Home / Self Care | Payer: BC Managed Care – PPO | Source: Ambulatory Visit | Attending: Hematology and Oncology | Admitting: Hematology and Oncology

## 2019-03-29 ENCOUNTER — Other Ambulatory Visit (HOSPITAL_COMMUNITY): Payer: BC Managed Care – PPO

## 2019-03-29 ENCOUNTER — Other Ambulatory Visit: Payer: Self-pay

## 2019-03-29 DIAGNOSIS — C50411 Malignant neoplasm of upper-outer quadrant of right female breast: Secondary | ICD-10-CM

## 2019-03-29 DIAGNOSIS — Z171 Estrogen receptor negative status [ER-]: Secondary | ICD-10-CM | POA: Diagnosis present

## 2019-03-29 IMAGING — CT CT CHEST W/ CM
2 of 5 series · 13 of 36 positions shown, 16 images · IV contrast (APPLIED)
Comparison: [DATE].

CLINICAL DATA: Breast cancer. Restaging.

EXAM:
CT CHEST, ABDOMEN, AND PELVIS WITH CONTRAST
TECHNIQUE: Multidetector CT imaging of the chest, abdomen and pelvis was
performed following the standard protocol during bolus
administration of intravenous contrast.
CONTRAST:  100mL OMNIPAQUE IOHEXOL 300 MG/ML  SOLN

[Series 2: cap with · axial · 0.88mm/px · z∈[-615,-100]mm · 10 of 127 slices shown, 13 images]
[im 12/127  mediastinal]
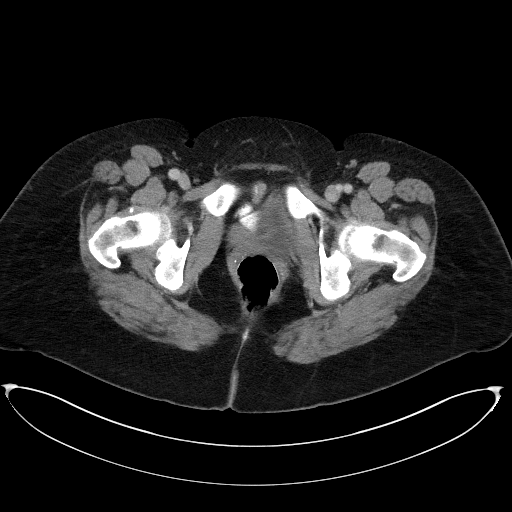
[im 12/127  lung]
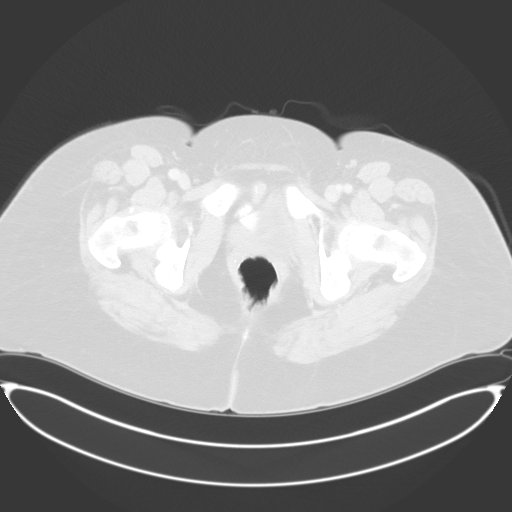
[im 23/127  lung]
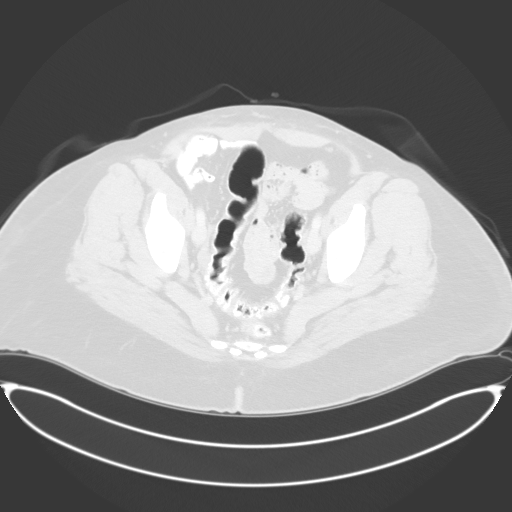
[im 35/127  lung]
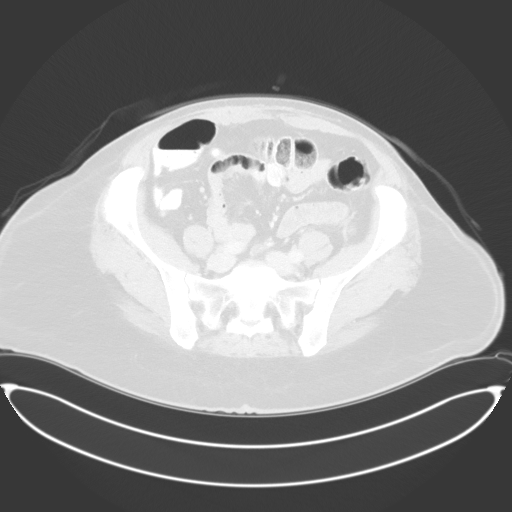
[im 46/127  lung]
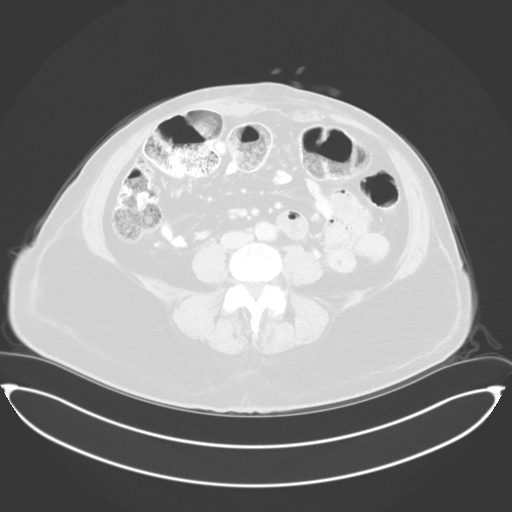
[im 58/127  mediastinal]
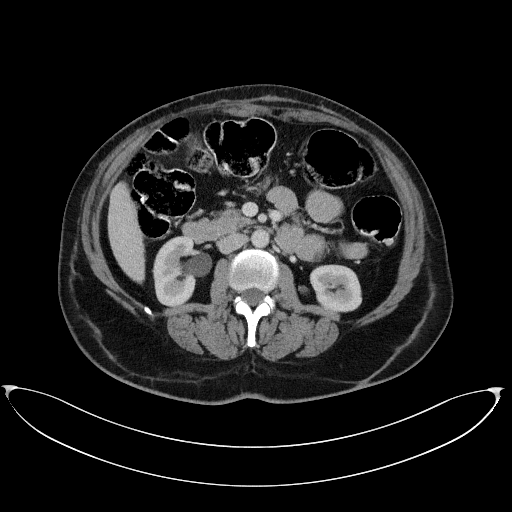
[im 58/127  lung]
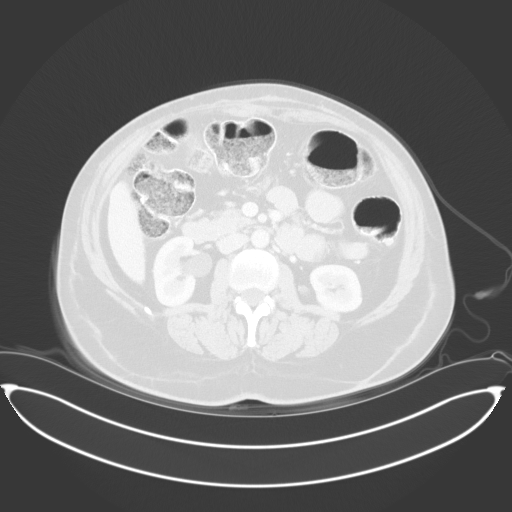
[im 69/127  lung]
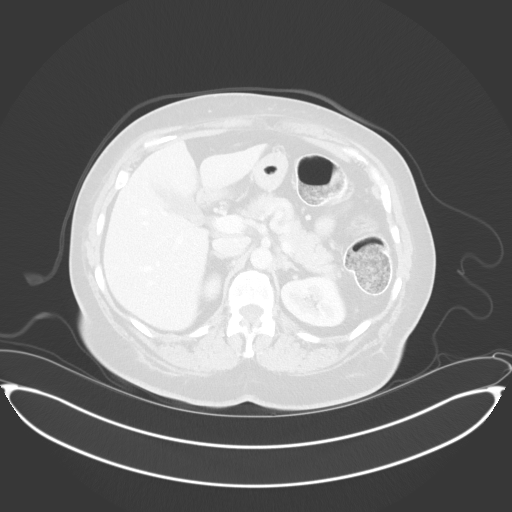
[im 81/127  lung]
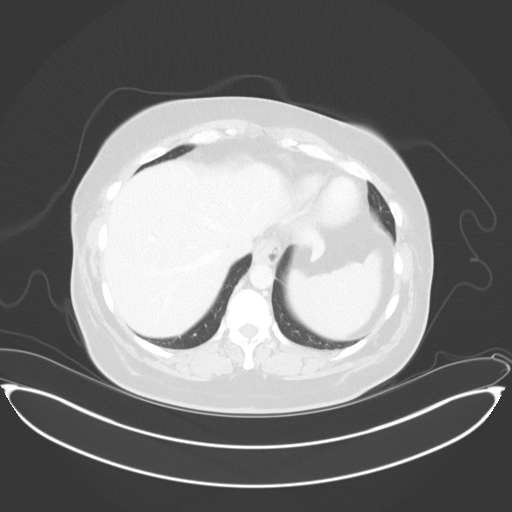
[im 92/127  lung]
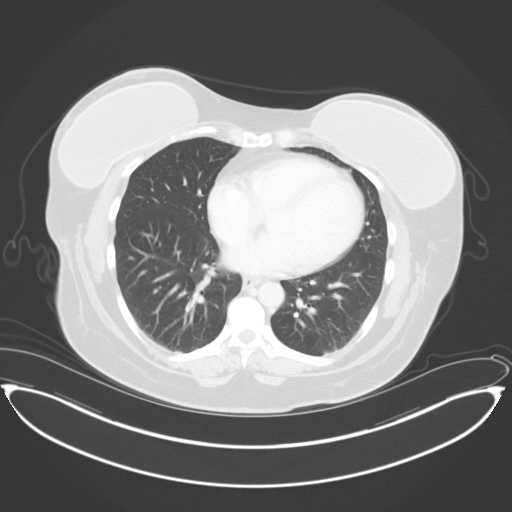
[im 104/127  mediastinal]
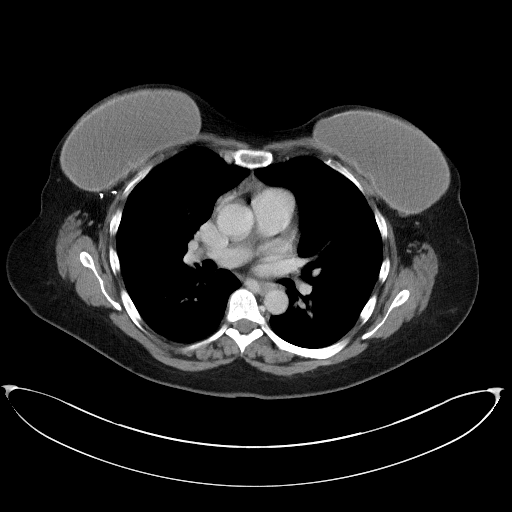
[im 104/127  lung]
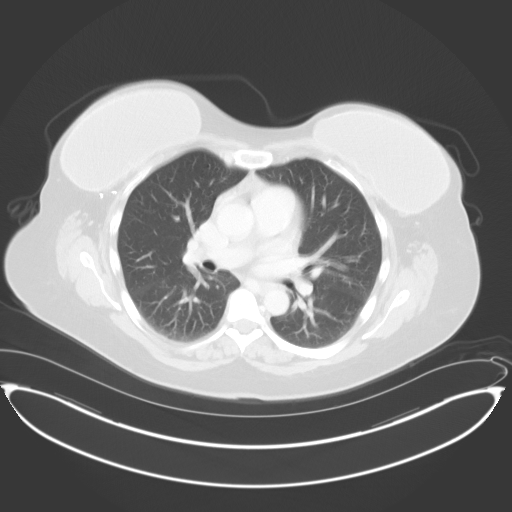
[im 115/127  lung]
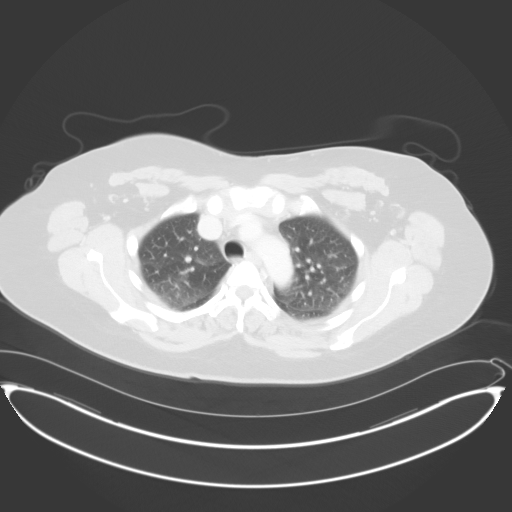

[Series 5: coronals · coronal · 0.82mm/px · 3 of 137 slices shown]
[im 28/137  lung]
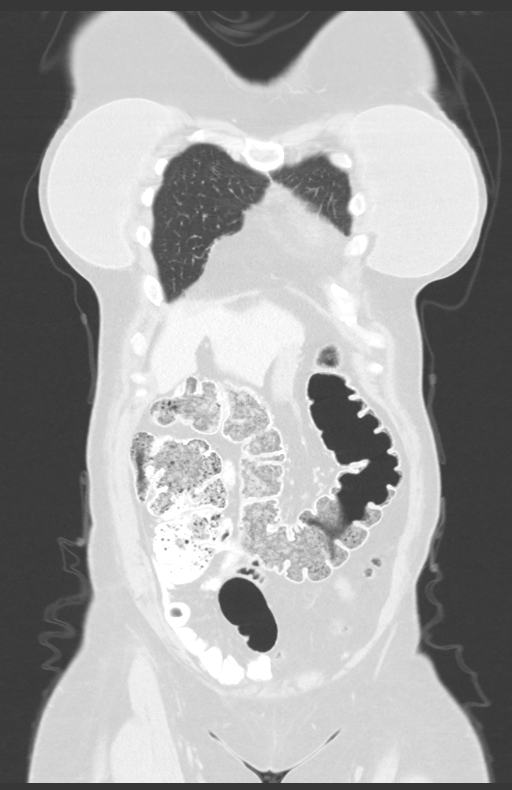
[im 55/137  lung]
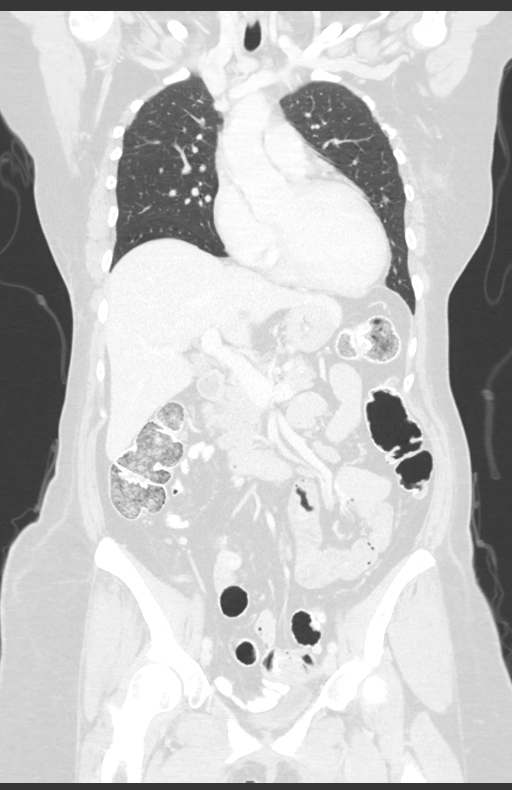
[im 82/137  lung]
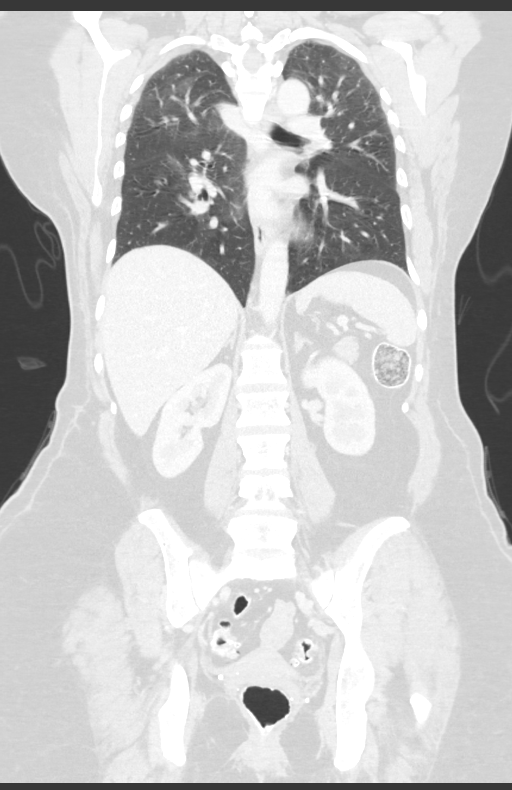

[13 of 36 positions shown; findings below may reference images not displayed]

FINDINGS: CT CHEST FINDINGS

Cardiovascular: The heart size is normal. No substantial pericardial
effusion.

Mediastinum/Nodes: No mediastinal lymphadenopathy. There is no hilar
lymphadenopathy. Tiny hiatal hernia noted. The esophagus has normal
imaging features. There is no axillary lymphadenopathy.

Lungs/Pleura: The tiny 2 mm peripheral right upper lobe pulmonary
nodule is stable since CT exam [DATE] consistent with benign
etiology. No new suspicious nodule or mass. No focal consolidation.
No pleural effusion.

Musculoskeletal: Interval bilateral breast reconstruction. No
worrisome lytic or sclerotic osseous abnormality.

CT ABDOMEN PELVIS FINDINGS

Hepatobiliary: No suspicious focal abnormality within the liver
parenchyma. There is no evidence for gallstones, gallbladder wall
thickening, or pericholecystic fluid. No intrahepatic or
extrahepatic biliary dilation.

Pancreas: No focal mass lesion. No dilatation of the main duct. No
intraparenchymal cyst. No peripancreatic edema.

Spleen: No splenomegaly. No focal mass lesion.

Adrenals/Urinary Tract: No adrenal nodule or mass. Kidneys
unremarkable. No evidence for hydroureter. The urinary bladder
appears normal for the degree of distention.

Stomach/Bowel: Stomach is unremarkable. No gastric wall thickening.
No evidence of outlet obstruction. Duodenum is normally positioned
as is the ligament of Treitz. No small bowel wall thickening. No
small bowel dilatation. The terminal ileum is normal. The appendix
is normal. No gross colonic mass. No colonic wall thickening.
Diverticular changes are noted in the left colon without evidence of
diverticulitis.

Vascular/Lymphatic: There is abdominal aortic atherosclerosis
without aneurysm. There is no gastrohepatic or hepatoduodenal
ligament lymphadenopathy. No intraperitoneal or retroperitoneal
lymphadenopathy. No pelvic sidewall lymphadenopathy.

Reproductive: The uterus is unremarkable.  There is no adnexal mass.

Other: No intraperitoneal free fluid.

Musculoskeletal: No worrisome lytic or sclerotic osseous
abnormality.
IMPRESSION: 1. No lymphadenopathy or other suspicious findings in the chest,
abdomen, or pelvis to suggest metastatic disease.
2. Continued stability of the 2-3 mm right upper lobe pulmonary
nodule.
3. Tiny hiatal hernia.
4. Left colonic diverticulosis without diverticulitis.
5. Aortic Atherosclerosis ([7W]-[7W]).

## 2019-03-29 IMAGING — CT CT ABD-PELV W/ CM
2 of 5 series · 13 of 36 positions shown, 16 images · IV contrast (APPLIED)
Comparison: [DATE].

CLINICAL DATA: Breast cancer. Restaging.

EXAM:
CT CHEST, ABDOMEN, AND PELVIS WITH CONTRAST
TECHNIQUE: Multidetector CT imaging of the chest, abdomen and pelvis was
performed following the standard protocol during bolus
administration of intravenous contrast.
CONTRAST:  100mL OMNIPAQUE IOHEXOL 300 MG/ML  SOLN

[Series 2: cap with · axial · 0.88mm/px · z∈[-615,-100]mm · 10 of 127 slices shown, 13 images]
[im 12/127  mediastinal]
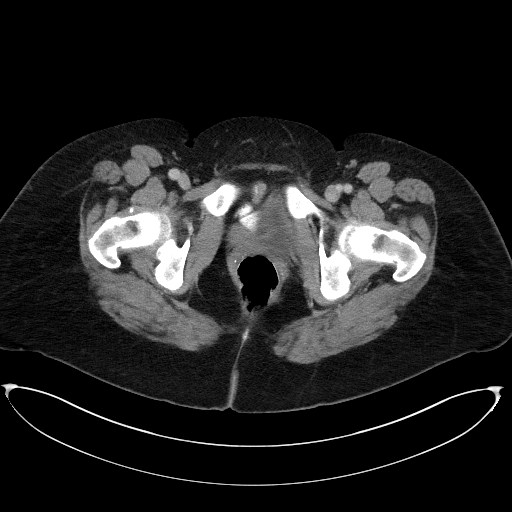
[im 12/127  lung]
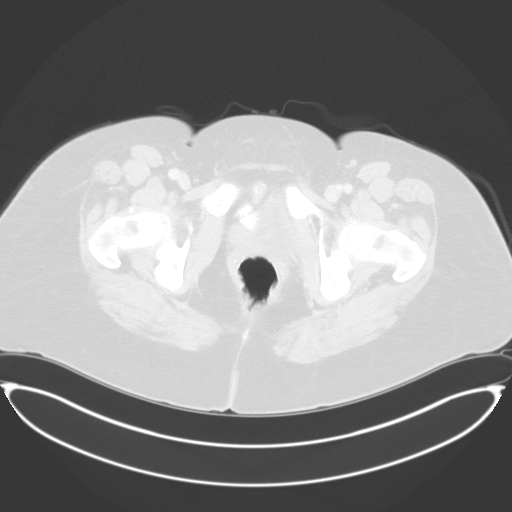
[im 23/127  lung]
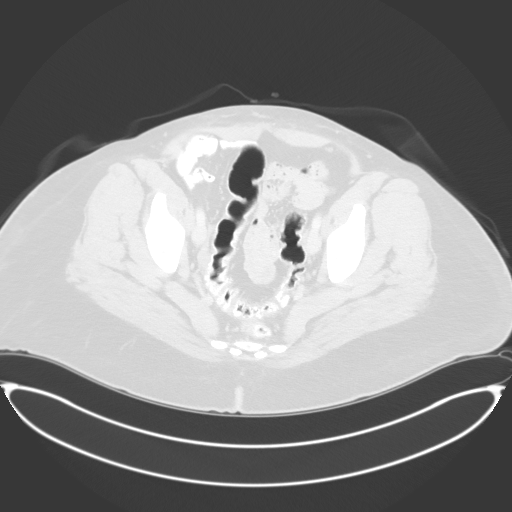
[im 35/127  lung]
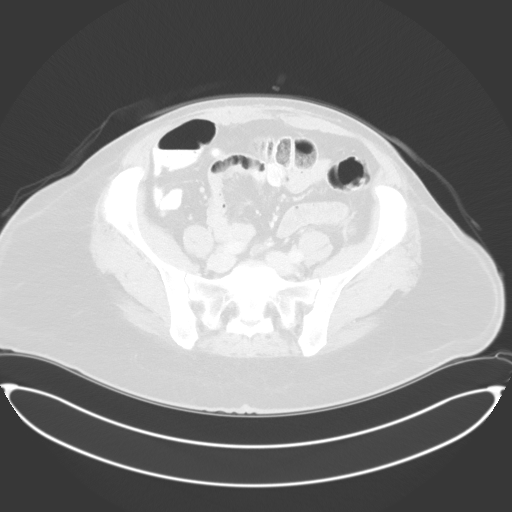
[im 46/127  lung]
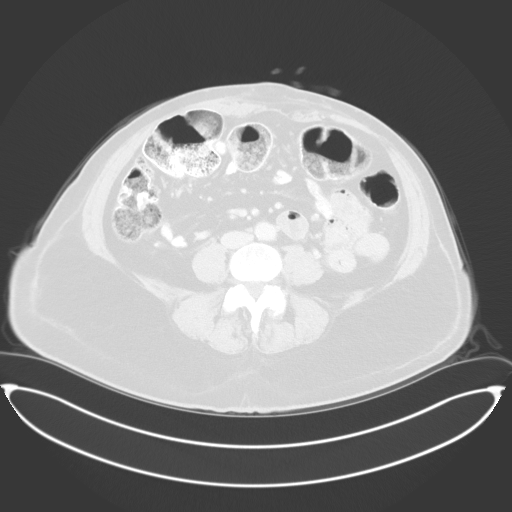
[im 58/127  mediastinal]
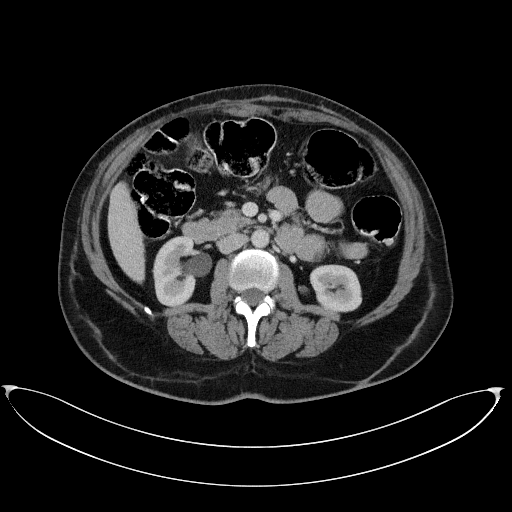
[im 58/127  lung]
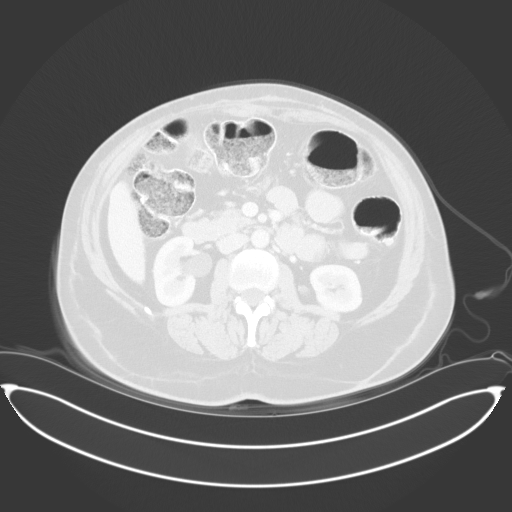
[im 69/127  lung]
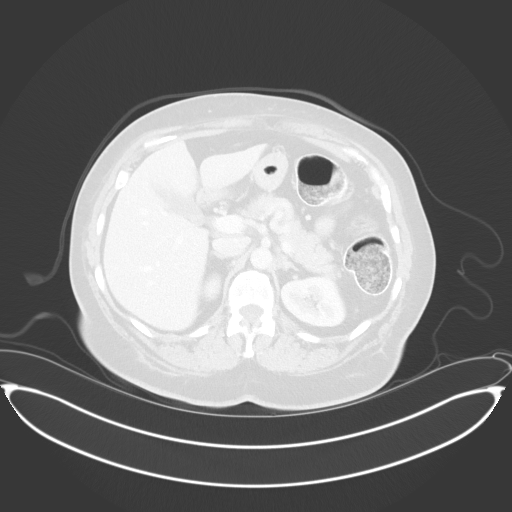
[im 81/127  lung]
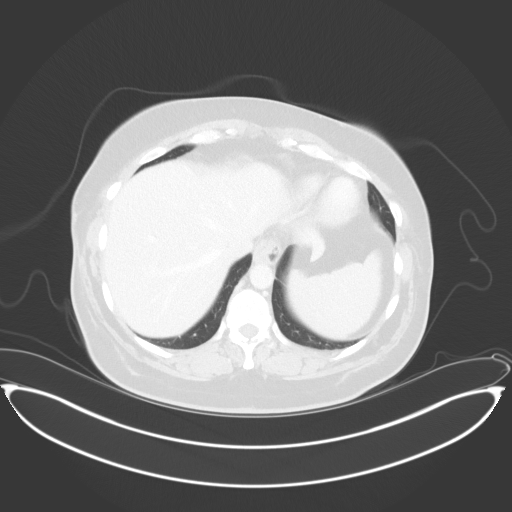
[im 92/127  lung]
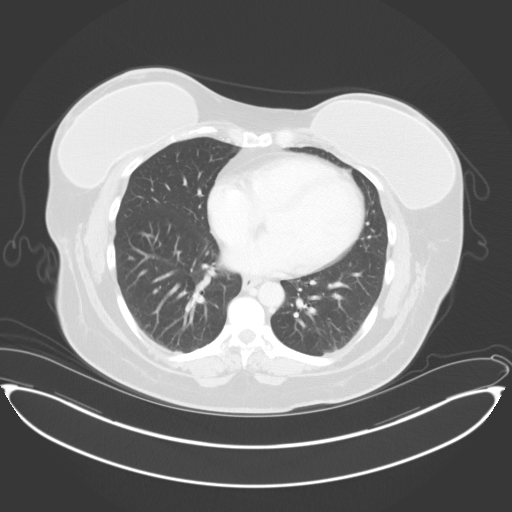
[im 104/127  mediastinal]
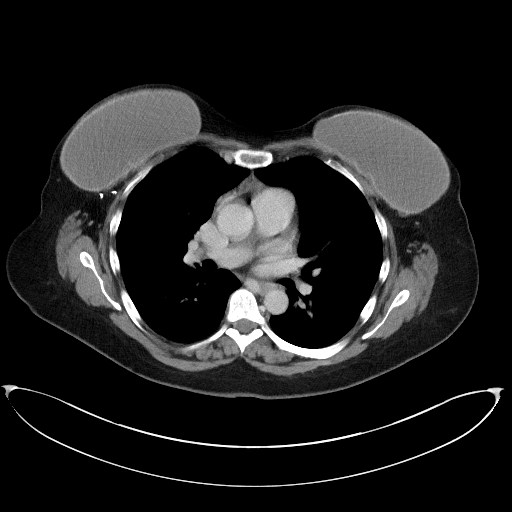
[im 104/127  lung]
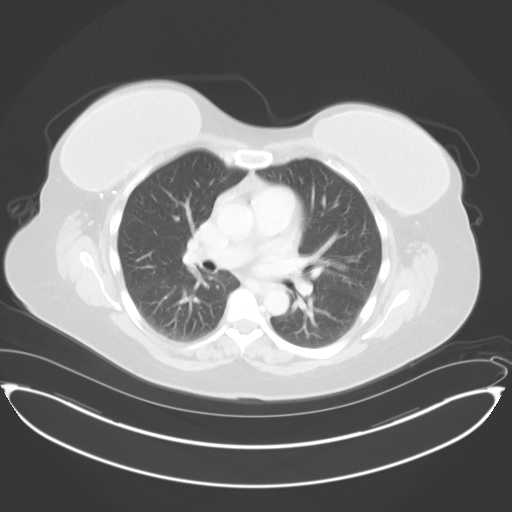
[im 115/127  lung]
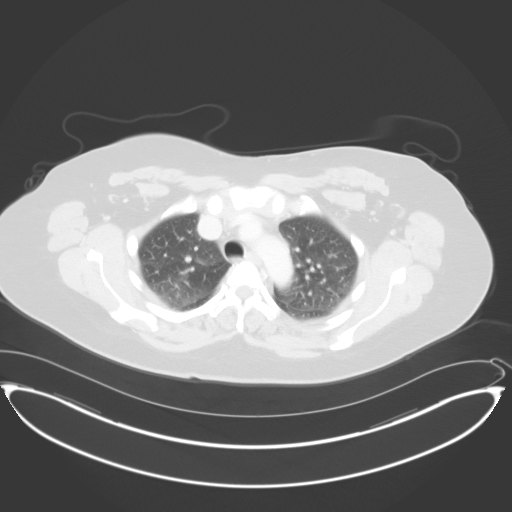

[Series 5: coronals · coronal · 0.82mm/px · 3 of 137 slices shown]
[im 28/137  lung]
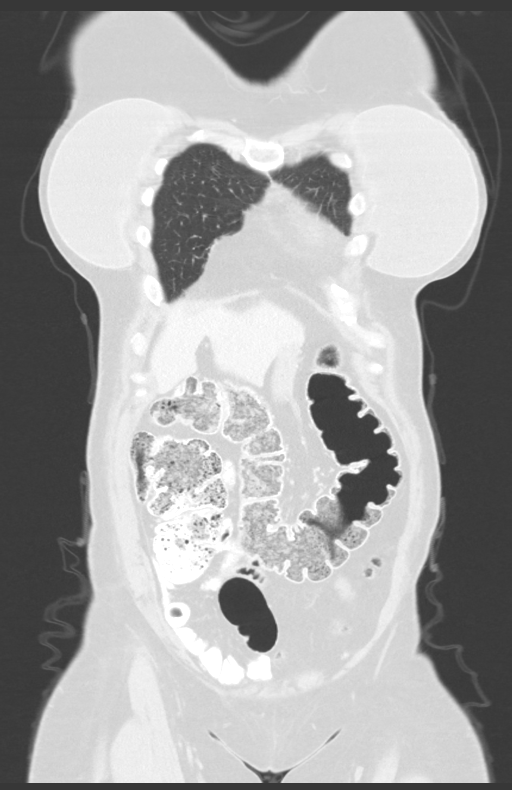
[im 55/137  lung]
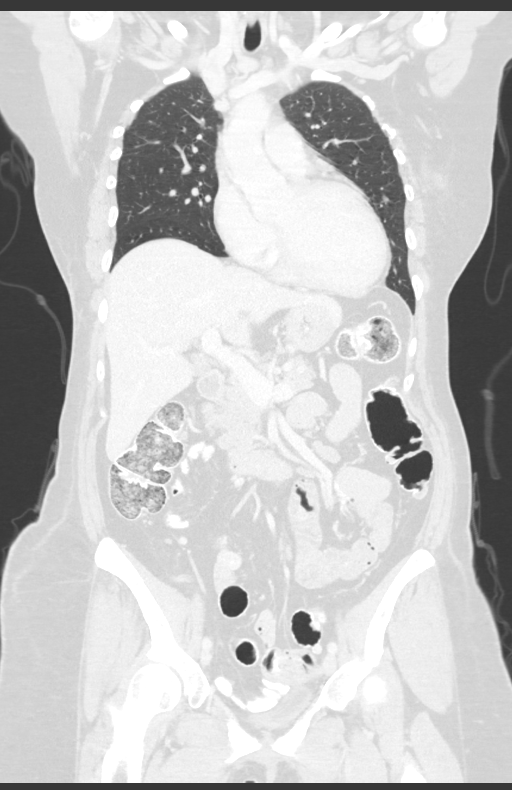
[im 82/137  lung]
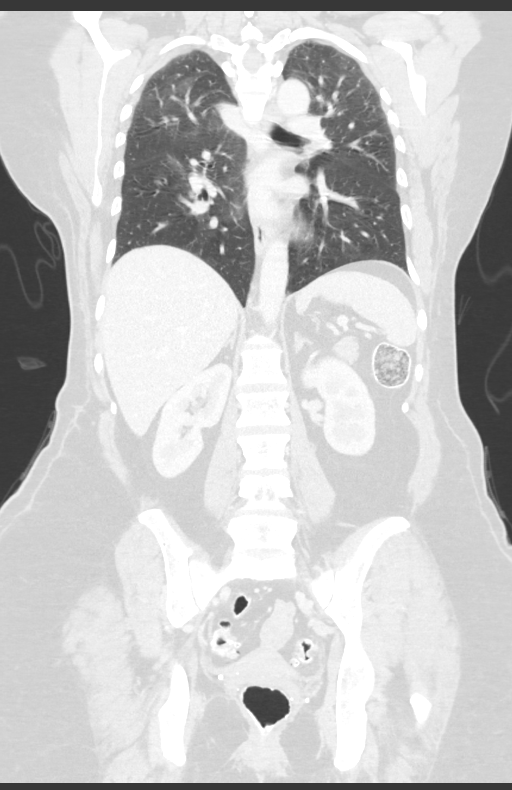

[13 of 36 positions shown; findings below may reference images not displayed]

FINDINGS: CT CHEST FINDINGS

Cardiovascular: The heart size is normal. No substantial pericardial
effusion.

Mediastinum/Nodes: No mediastinal lymphadenopathy. There is no hilar
lymphadenopathy. Tiny hiatal hernia noted. The esophagus has normal
imaging features. There is no axillary lymphadenopathy.

Lungs/Pleura: The tiny 2 mm peripheral right upper lobe pulmonary
nodule is stable since CT exam [DATE] consistent with benign
etiology. No new suspicious nodule or mass. No focal consolidation.
No pleural effusion.

Musculoskeletal: Interval bilateral breast reconstruction. No
worrisome lytic or sclerotic osseous abnormality.

CT ABDOMEN PELVIS FINDINGS

Hepatobiliary: No suspicious focal abnormality within the liver
parenchyma. There is no evidence for gallstones, gallbladder wall
thickening, or pericholecystic fluid. No intrahepatic or
extrahepatic biliary dilation.

Pancreas: No focal mass lesion. No dilatation of the main duct. No
intraparenchymal cyst. No peripancreatic edema.

Spleen: No splenomegaly. No focal mass lesion.

Adrenals/Urinary Tract: No adrenal nodule or mass. Kidneys
unremarkable. No evidence for hydroureter. The urinary bladder
appears normal for the degree of distention.

Stomach/Bowel: Stomach is unremarkable. No gastric wall thickening.
No evidence of outlet obstruction. Duodenum is normally positioned
as is the ligament of Treitz. No small bowel wall thickening. No
small bowel dilatation. The terminal ileum is normal. The appendix
is normal. No gross colonic mass. No colonic wall thickening.
Diverticular changes are noted in the left colon without evidence of
diverticulitis.

Vascular/Lymphatic: There is abdominal aortic atherosclerosis
without aneurysm. There is no gastrohepatic or hepatoduodenal
ligament lymphadenopathy. No intraperitoneal or retroperitoneal
lymphadenopathy. No pelvic sidewall lymphadenopathy.

Reproductive: The uterus is unremarkable.  There is no adnexal mass.

Other: No intraperitoneal free fluid.

Musculoskeletal: No worrisome lytic or sclerotic osseous
abnormality.
IMPRESSION: 1. No lymphadenopathy or other suspicious findings in the chest,
abdomen, or pelvis to suggest metastatic disease.
2. Continued stability of the 2-3 mm right upper lobe pulmonary
nodule.
3. Tiny hiatal hernia.
4. Left colonic diverticulosis without diverticulitis.
5. Aortic Atherosclerosis ([7W]-[7W]).

## 2019-03-29 MED ORDER — IOHEXOL 300 MG/ML  SOLN
100.0000 mL | Freq: Once | INTRAMUSCULAR | Status: AC | PRN
  Administered 2019-03-29: 100 mL via INTRAVENOUS

## 2019-03-29 MED ORDER — SODIUM CHLORIDE (PF) 0.9 % IJ SOLN
INTRAMUSCULAR | Status: AC
  Filled 2019-03-29: qty 50

## 2019-03-30 NOTE — Progress Notes (Signed)
Patient Care Team: Elby Showers, MD as PCP - General (Internal Medicine) Baxley, Cresenciano Lick, MD (Internal Medicine)  DIAGNOSIS:    ICD-10-CM   1. Malignant neoplasm of upper-outer quadrant of right breast in female, estrogen receptor negative ([place])  C50.411    Z17.1     SUMMARY OF ONCOLOGIC HISTORY: Oncology History  Malignant neoplasm of upper-outer quadrant of right breast in female, estrogen receptor negative ([place])  07/09/2017 Initial Diagnosis   Patient palpated a right breast mass which was evaluated by mammogram and ultrasound and a breast MRI which revealed 4.5 x 4 cm necrotic tumor with suspicious multiple right axillary lymph nodes; biopsy revealed IDC grade 3 ER 0%, PR 0%, HER-2 positive ratio 3.08, Ki-67 70%, T2NX stage IIa/IIb   07/18/2017 - 11/04/2017 Neo-Adjuvant Chemotherapy   TCH Perjeta x6 cycles followed by Herceptin Perjeta maintenance   08/15/2017 Genetic Testing   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  The Multi-Gene Panel offered by [facility] includes sequencing and/or deletion duplication testing of the following 83 genes: ALK, APC, ATM, AXIN2,BAP1,  BARD1, BLM, BMPR1A, BRCA1, BRCA2, BRIP1, CASR, CDC73, CDH1, CDK4, CDKN1B, CDKN1C, CDKN2A (p14ARF), CDKN2A (p16INK4a), CEBPA, CHEK2, CTNNA1, DICER1, DIS3L2, EGFR (c.2369C>T, p.Thr790Met variant only), EPCAM (Deletion/duplication testing only), FH, FLCN, GATA2, GPC3, GREM1 (Promoter region deletion/duplication testing only), HOXB13 (c.251G>A, p.Gly84Glu), HRAS, KIT, MAX, MEN1, MET, MITF (c.952G>A, p.Glu318Lys variant only), MLH1, MSH2, MSH3, MSH6, MUTYH, NBN, NF1, NF2, NTHL1, PALB2, PDGFRA, PHOX2B, PMS2, POLD1, POLE, POT1, PRKAR1A, PTCH1, PTEN, RAD50, RAD51C, RAD51D, RB1, RECQL4, RET, RUNX1, SDHAF2, SDHA (sequence changes only), SDHB, SDHC, SDHD, SMAD4, SMARCA4, SMARCB1, SMARCE1, STK11, SUFU, TERT, TERT, TMEM127, TP53, TSC1, TSC2, VHL, WRN and WT1.  The report date is August 15, 2017.   11/18/2017  Surgery   Bilateral mastectomies: Right mastectomy: IDC grade 3 3.8 cm margins negative, 0/4 lymph nodes negative, ER 0%, PR 0%, HER-2 negative ratio 1.3, IHC HER-2 negative; Ki-67 70%, RCB class II; left mastectomy: [facility]   12/18/2017 - 01/27/2018 Chemotherapy   Adjuvant chemotherapy with dose dense Adriamycin and Cytoxan x4   02/17/2018 -  Chemotherapy   Maintenance Herceptin and Perjeta   02/24/2018 Surgery   REMOVAL OF BILATERAL TISSUE EXPANDERS WITH PLACEMENT OF BILATERAL BREAST IMPLANTS and LIPOFILLING FROM ABDOMEN TO BILATERAL CHEST by Dr. Iran Planas      CHIEF COMPLIANT: Follow-up of breast cancer to review scans  INTERVAL HISTORY: Rebecca [name] is a 48 y.o. with above-mentioned history of HER-2 positive breast cancertreated with neoadjuvantchemotherapy, bilateral mastectomies, andHerceptinmaintenance.CTC AP on 03/29/19 showed no evidence of metastatic disease and a stable right upper lobe pulmonary nodule.She presents to the clinicalonetoday to discuss her scans.   She is feeling remarkably well.  She has been working out 45 minutes every day on the treadmill. Her blood pressure has been running low and Dr. Einar Gip has been adjusting her dosage.  REVIEW OF SYSTEMS:   Constitutional: Denies fevers, chills or abnormal weight loss Eyes: Denies blurriness of vision Ears, nose, mouth, throat, and face: Denies mucositis or sore throat Respiratory: Denies cough, dyspnea or wheezes Cardiovascular: Denies palpitation, chest discomfort Gastrointestinal: Denies nausea, heartburn or change in bowel habits Skin: Denies abnormal skin rashes Lymphatics: Denies new lymphadenopathy or easy bruising Neurological: Denies numbness, tingling or new weaknesses Behavioral/Psych: Mood is stable, no new changes  Extremities: No lower extremity edema Breast: denies any pain or lumps or nodules in either breasts All other systems were reviewed with the patient and are negative.  I have  reviewed the past medical history, past surgical history, social history and family history with the patient and they are unchanged from previous note.  ALLERGIES:  has No Known Allergies.  MEDICATIONS:  Current Outpatient Medications  Medication Sig Dispense Refill  . carvedilol (COREG) 6.25 MG tablet Take 1.5 tablets (9.375 mg total) by mouth 2 (two) times daily. 60 tablet 1  . Cholecalciferol ([facility] D3) 50 MCG (2000 UT) CAPS Take by mouth.    . dapagliflozin propanediol (FARXIGA) 10 MG TABS tablet Take 10 mg by mouth daily.    . diphenoxylate-atropine (LOMOTIL) 2.5-0.025 MG tablet Take 1 tablet by mouth 4 (four) times daily as needed for diarrhea or loose stools. 30 tablet 2  . ENTRESTO 49-51 MG TAKE 1 TABLET BY MOUTH TWICE A DAY 60 tablet 1  . esomeprazole (NEXIUM) 40 MG capsule TAKE 1 CAPSULE (40 MG TOTAL) BY MOUTH DAILY AT 12 NOON. 90 capsule 2  . ferrous sulfate 325 (65 FE) MG tablet Take 325 mg by mouth daily with breakfast.    . levothyroxine (SYNTHROID, LEVOTHROID) 137 MCG tablet Take 125 mcg by mouth daily before breakfast.     . LORazepam (ATIVAN) 1 MG tablet One po bid prn 60 tablet 3  . meclizine (ANTIVERT) 12.5 MG tablet Take 1 tablet (12.5 mg total) by mouth 3 (three) times daily as needed for dizziness. 30 tablet 0  . ondansetron (ZOFRAN ODT) 4 MG disintegrating tablet Take 1 tablet (4 mg total) by mouth every 8 (eight) hours as needed for nausea or vomiting. 20 tablet 3  . rosuvastatin (CRESTOR) 20 MG tablet Take 1 tablet (20 mg total) by mouth daily. 90 tablet 3  . triamterene-hydrochlorothiazide (MAXZIDE) 75-50 MG tablet TAKE 1 TABLET BY MOUTH DAILY FOR EDEMA 90 tablet 0  . venlafaxine XR (EFFEXOR XR) 75 MG 24 hr capsule Take 1 capsule (75 mg total) by mouth daily with breakfast. 90 capsule 0  . venlafaxine XR (EFFEXOR-XR) 75 MG 24 hr capsule TAKE 1 CAPSULE (75 MG TOTAL) BY MOUTH DAILY WITH BREAKFAST. 90 capsule 0   No current facility-administered medications for this visit.     Facility-Administered Medications Ordered in Other Visits  Medication Dose Route Frequency Provider Last Rate Last Dose  . LORazepam (ATIVAN) injection 1 mg  1 mg Intravenous Once Harle Stanford., PA-C        PHYSICAL EXAMINATION: ECOG PERFORMANCE STATUS: 1 - Symptomatic but completely ambulatory  There were no vitals filed for this visit. There were no vitals filed for this visit.  GENERAL: alert, no distress and comfortable SKIN: skin color, texture, turgor are normal, no rashes or significant lesions EYES: normal, Conjunctiva are pink and non-injected, sclera clear OROPHARYNX: no exudate, no erythema and lips, buccal mucosa, and tongue normal  NECK: supple, thyroid normal size, non-tender, without nodularity LYMPH: no palpable lymphadenopathy in the cervical, axillary or inguinal LUNGS: clear to auscultation and percussion with normal breathing effort HEART: regular rate & rhythm and no murmurs and no lower extremity edema ABDOMEN: abdomen soft, non-tender and normal bowel sounds MUSCULOSKELETAL: no cyanosis of digits and no clubbing  NEURO: alert & oriented x 3 with fluent speech, no focal motor/sensory deficits EXTREMITIES: No lower extremity edema  LABORATORY DATA:  I have reviewed the data as listed CMP Latest Ref Rng & Units 03/19/2019 01/27/2019 01/19/2019  Glucose 70 - 99 mg/dL 114(H) 122(H) 115(H)  BUN 6 - 20 mg/dL 20 19 27(H)  Creatinine 0.44 - 1.00 mg/dL 1.05(H) 1.08(H) 1.02(H)  Sodium 135 -  145 mmol/L 138 139 139  Potassium 3.5 - 5.1 mmol/L 4.6 4.9 4.9  Chloride 98 - 111 mmol/L 102 99 100  CO2 22 - 32 mmol/L 26 26 26   Calcium 8.9 - 10.3 mg/dL 9.1 9.9 9.7  Total Protein 6.5 - 8.1 g/dL 7.3 7.2 -  Total Bilirubin 0.3 - 1.2 mg/dL 0.3 0.5 -  Alkaline Phos 38 - 126 U/L 82 92 -  AST 15 - 41 U/L 20 16 -  ALT 0 - 44 U/L 26 21 -    Lab Results  Component Value Date   WBC 5.8 12/29/2018   HGB 12.0 12/29/2018   HCT 39.1 12/29/2018   MCV 79.8 (L) 12/29/2018   PLT  294 12/29/2018   NEUTROABS 3,863 12/29/2018    ASSESSMENT & PLAN:  Malignant neoplasm of upper-outer quadrant of right breast in female, estrogen receptor negative ([place]) 07/09/2017:Patient palpated a right breast mass which was evaluated by mammogram and ultrasound and a breast MRI which revealed 4.5 x 4 cm necrotic tumor with suspicious multiple right axillary lymph nodes; biopsy revealed IDC grade 3 ER 0%, PR 0%, HER-2 positive ratio 3.08, Ki-67 70%, T2NX stage IIa/IIb  Treatment planbased on multidisciplinary tumor board: 1. Neoadjuvant chemotherapy with TCH Perjeta 6 cycles followed by Herceptinand Perjetamaintenance for 1 year 2. 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 Einar Gip with cardiology.  They are adjusting her beta-blocker therapy.  Next echocardiogram will be later this month.   CT CAP 03/29/2019: No findings of metastatic disease.  Stability of 2 to 3 mm right upper lobe nodule. Return to clinic in 4 months with follow-up Patient is anxious to not go any longer than 4 months.    No orders of the defined types were placed in this encounter.  The patient has a good understanding of the overall plan. she agrees with it. she will call with any problems that may develop before the next visit  here.  Nicholas Lose, MD 03/31/2019  Julious Oka Dorshimer, am acting as scribe for Dr. Nicholas Lose.  I have reviewed the above documentation for accuracy and completeness, and I agree with the above.

## 2019-03-31 ENCOUNTER — Other Ambulatory Visit: Payer: Self-pay

## 2019-03-31 ENCOUNTER — Telehealth: Payer: Self-pay | Admitting: Hematology and Oncology

## 2019-03-31 ENCOUNTER — Inpatient Hospital Stay: Payer: BC Managed Care – PPO | Attending: Hematology and Oncology | Admitting: Hematology and Oncology

## 2019-03-31 DIAGNOSIS — Z79899 Other long term (current) drug therapy: Secondary | ICD-10-CM | POA: Diagnosis not present

## 2019-03-31 DIAGNOSIS — C50411 Malignant neoplasm of upper-outer quadrant of right female breast: Secondary | ICD-10-CM | POA: Insufficient documentation

## 2019-03-31 DIAGNOSIS — Z9221 Personal history of antineoplastic chemotherapy: Secondary | ICD-10-CM | POA: Diagnosis not present

## 2019-03-31 DIAGNOSIS — Z9013 Acquired absence of bilateral breasts and nipples: Secondary | ICD-10-CM | POA: Insufficient documentation

## 2019-03-31 DIAGNOSIS — Z171 Estrogen receptor negative status [ER-]: Secondary | ICD-10-CM | POA: Insufficient documentation

## 2019-03-31 DIAGNOSIS — R911 Solitary pulmonary nodule: Secondary | ICD-10-CM | POA: Insufficient documentation

## 2019-03-31 NOTE — Assessment & Plan Note (Signed)
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 Neoadjuvant chemotherapy with TCH Perjeta 6 cycles followed by Herceptinand Perjetamaintenance for 1 year 2. 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 Einar Gip with cardiology.  They are adjusting her beta-blocker therapy.  Next echocardiogram will be later this month.   CT CAP 03/29/2019: No findings of metastatic disease.  Stability of 2 to 3 mm right upper lobe nodule. Return to clinic in 6 months with follow-up

## 2019-03-31 NOTE — Telephone Encounter (Signed)
I talk with patient regarding schedule  

## 2019-04-06 ENCOUNTER — Ambulatory Visit: Payer: BC Managed Care – PPO | Admitting: Internal Medicine

## 2019-04-06 ENCOUNTER — Encounter: Payer: Self-pay | Admitting: *Deleted

## 2019-04-12 ENCOUNTER — Encounter: Payer: Self-pay | Admitting: Hematology and Oncology

## 2019-04-12 ENCOUNTER — Ambulatory Visit (INDEPENDENT_AMBULATORY_CARE_PROVIDER_SITE_OTHER): Payer: BC Managed Care – PPO

## 2019-04-12 ENCOUNTER — Other Ambulatory Visit: Payer: Self-pay

## 2019-04-12 DIAGNOSIS — I427 Cardiomyopathy due to drug and external agent: Secondary | ICD-10-CM | POA: Diagnosis not present

## 2019-04-12 DIAGNOSIS — I447 Left bundle-branch block, unspecified: Secondary | ICD-10-CM | POA: Diagnosis not present

## 2019-04-12 DIAGNOSIS — I10 Essential (primary) hypertension: Secondary | ICD-10-CM

## 2019-04-13 ENCOUNTER — Encounter: Payer: Self-pay | Admitting: Hematology and Oncology

## 2019-04-13 NOTE — Telephone Encounter (Signed)
Per patient

## 2019-04-14 ENCOUNTER — Other Ambulatory Visit: Payer: Self-pay | Admitting: Cardiology

## 2019-05-11 ENCOUNTER — Other Ambulatory Visit (HOSPITAL_COMMUNITY): Payer: Self-pay | Admitting: Internal Medicine

## 2019-05-21 ENCOUNTER — Other Ambulatory Visit: Payer: Self-pay | Admitting: Cardiology

## 2019-05-27 ENCOUNTER — Encounter: Payer: Self-pay | Admitting: Internal Medicine

## 2019-05-27 ENCOUNTER — Ambulatory Visit (INDEPENDENT_AMBULATORY_CARE_PROVIDER_SITE_OTHER): Payer: 59 | Admitting: Internal Medicine

## 2019-05-27 ENCOUNTER — Other Ambulatory Visit: Payer: Self-pay

## 2019-05-27 VITALS — BP 108/73 | Temp 97.7°F | Ht 70.0 in | Wt 217.0 lb

## 2019-05-27 DIAGNOSIS — L538 Other specified erythematous conditions: Secondary | ICD-10-CM | POA: Diagnosis not present

## 2019-05-27 MED ORDER — LORAZEPAM 1 MG PO TABS: ORAL_TABLET | ORAL | 3 refills | Status: DC

## 2019-05-27 MED ORDER — PREDNISONE 10 MG PO TABS: ORAL_TABLET | ORAL | 1 refills | Status: DC

## 2019-05-27 NOTE — Patient Instructions (Addendum)
Take Prednisone in tapering course as directed 6-5-4-3-2-1 then start Zyrtec 5 mg daily for 30 days. Take Lorazepam for anxiety. Use Sarna lotion on skin for itching.

## 2019-05-27 NOTE — Progress Notes (Signed)
   Subjective:    Patient ID: Rebecca [name], female    DOB: 1970/09/03, 49 y.o.   MRN: [id number]  HPI  49 year old Female messaged [place] with complaint of hives. Doxy visit advised today.  Patient is currently working from home.  She is seen today by [employer] due to coronavirus pandemic.  She is agreeable to visit in this format today.  She is identified using 2 identifiers as Rebecca [name], a patient in this practice.  Her husband operates an Designer, industrial/product for foreign [employer].  She has a history of breast cancer and also is being followed by Dr. 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  She has been started on Entresto and Coreg dose was increased.  She has chronic dyspnea on exertion which is stable.  Patient has history of hypothyroidism status post radioactive iodine therapy in 2018.  She has anxiety depression.  Apparently less than 1% of patients and skin rash with Entresto and Coreg. Pain, skin rash.  She sent some pictures of her rash and they do appear to be mainly macular  erythema of the arms.  She has no facial rash or difficulty breathing.      Review of Systems see above-Benadryl helps.  Rashes noted on the shoulders and wrists.  It seems to be migratory.  Admits to situational stress.     Objective:   Physical Exam  Pictures reviewed appears to be in blotches of macular erythema on her arms      Assessment & Plan:  Pruritus  Macular erythema of arms  Plan: Prednisone 10 mg (#21) going from 60 mg to 0 mg over 7 days.  Ativan 1 mg twice daily as needed for anxiety.  Call if symptoms recur.  If they do recur we will send her to allergist for evaluation.  Take Zyrtec 5 mg daily for 30 days.  Use Sarna lotion for itching as needed.  If symptoms recur, allergy consultation will be obtained to evaluate urticarial type symptoms  20 minutes spent with reviewing history,  reviewing prior medication list and potential drug adverse reactions for several drugs she is taking.  Medical decision making including treatment plan.

## 2019-05-28 ENCOUNTER — Other Ambulatory Visit: Payer: Self-pay | Admitting: Cardiology

## 2019-05-28 DIAGNOSIS — R0609 Other forms of dyspnea: Secondary | ICD-10-CM

## 2019-05-28 DIAGNOSIS — I427 Cardiomyopathy due to drug and external agent: Secondary | ICD-10-CM

## 2019-05-31 ENCOUNTER — Ambulatory Visit: Payer: Self-pay | Admitting: Cardiology

## 2019-06-06 ENCOUNTER — Other Ambulatory Visit (HOSPITAL_COMMUNITY): Payer: Self-pay | Admitting: Internal Medicine

## 2019-06-07 ENCOUNTER — Other Ambulatory Visit: Payer: Self-pay

## 2019-06-07 ENCOUNTER — Encounter: Payer: Self-pay | Admitting: Internal Medicine

## 2019-06-07 ENCOUNTER — Encounter: Payer: Self-pay | Admitting: Hematology and Oncology

## 2019-06-07 ENCOUNTER — Ambulatory Visit: Payer: 59 | Admitting: Internal Medicine

## 2019-06-07 VITALS — BP 120/70 | HR 80 | Temp 97.9°F | Ht 70.0 in | Wt 227.0 lb

## 2019-06-07 DIAGNOSIS — F419 Anxiety disorder, unspecified: Secondary | ICD-10-CM

## 2019-06-07 DIAGNOSIS — I427 Cardiomyopathy due to drug and external agent: Secondary | ICD-10-CM

## 2019-06-07 DIAGNOSIS — F329 Major depressive disorder, single episode, unspecified: Secondary | ICD-10-CM

## 2019-06-07 DIAGNOSIS — R768 Other specified abnormal immunological findings in serum: Secondary | ICD-10-CM | POA: Diagnosis not present

## 2019-06-07 DIAGNOSIS — L509 Urticaria, unspecified: Secondary | ICD-10-CM | POA: Diagnosis not present

## 2019-06-07 DIAGNOSIS — Z853 Personal history of malignant neoplasm of breast: Secondary | ICD-10-CM

## 2019-06-07 DIAGNOSIS — F411 Generalized anxiety disorder: Secondary | ICD-10-CM | POA: Diagnosis not present

## 2019-06-07 MED ORDER — METHYLPREDNISOLONE ACETATE 80 MG/ML IJ SUSP
80.0000 mg | Freq: Once | INTRAMUSCULAR | Status: AC
  Administered 2019-06-07: 80 mg via INTRAMUSCULAR

## 2019-06-07 NOTE — Progress Notes (Signed)
   Subjective:    Patient ID: Rebecca [name], female    DOB: May 09, 1970, 49 y.o.   MRN: [id number]  HPI She developed hive- like lesions late January. Seen virtually on January 28. After looking in, Up to Date, rash did not seem to be Entresto or Coreg related as less than 1% develop rash with these meds. Had some situational stress but not overly anxious.Was treated with Prednisone in tapering course for 7 days. Told her to take Zyrtec for 30 days.Advised Sarna lotion. Given Ativan for anxiety. Also saw , Dermatologist, Dr. Renda Rolls, who gave her a steroid injection, Singulair and Zyrtec with improvement.  Symptoms returned Feb 6th and she is being seen today.    Review of Systems see above.   No fever chills, URI symptoms. 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 Dr. Lindi Adie is oncologist. He called her in Steroid dose pack a couple of days ago. Less itching but rash still present     Objective:   Physical Exam VS reviewed . She has multiple lesions consistent with hives on her trunk       Assessment & Plan:  Recurrent urticaria  Anxiety  History of breast cancer followed by Dr. 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  Told to continue with tapering course of Medrol which was called in by her oncologist.  Consider consider referral to allergist for recurrent urticaria.  Stop Maxzide.  I do not see any other medications that could be causing hives.  Continue Effexor.  Given triamcinolone cream 0.1% and 4 ounces of Eucerin cream to apply up to 3 times daily as needed.  Continue Ativan 1 mg twice daily as needed.  Labs drawn including sed rate, ANA, CBC with differential.  Addendum: She has a positive ANA.  Anti-Smith antibody is negative.  Anti-DNA antibody is negative.  This would rule out lupus.  She was worried about this.

## 2019-06-11 ENCOUNTER — Telehealth: Payer: Self-pay | Admitting: Internal Medicine

## 2019-06-11 ENCOUNTER — Telehealth (INDEPENDENT_AMBULATORY_CARE_PROVIDER_SITE_OTHER): Payer: 59 | Admitting: Internal Medicine

## 2019-06-11 DIAGNOSIS — L509 Urticaria, unspecified: Secondary | ICD-10-CM | POA: Diagnosis not present

## 2019-06-11 MED ORDER — METHYLPREDNISOLONE 4 MG PO TABS: 4.0000 mg | ORAL_TABLET | Freq: Every day | ORAL | 0 refills | Status: DC

## 2019-06-11 NOTE — Telephone Encounter (Signed)
Calling patient about uriticaria

## 2019-06-11 NOTE — Telephone Encounter (Signed)
Rash had completely resolved on medrol and topical steroid cream combined with Eucerin. Now on last dose of dose pack and has one urticarial lesion forming. Has been off HCTZ several days. Would like refill on dose pack. Told her we would likely need allergist help with urticaria. She is to call next week with progress report.

## 2019-06-14 LAB — CBC WITH DIFFERENTIAL/PLATELET
Absolute Monocytes: 335 cells/uL (ref 200–950)
Basophils Absolute: 13 cells/uL (ref 0–200)
Basophils Relative: 0.1 %
Eosinophils Absolute: 0 cells/uL — ABNORMAL LOW (ref 15–500)
Eosinophils Relative: 0 %
HCT: 44 % (ref 35.0–45.0)
Hemoglobin: 14 g/dL (ref 11.7–15.5)
Lymphs Abs: 1179 cells/uL (ref 850–3900)
MCH: 26.4 pg — ABNORMAL LOW (ref 27.0–33.0)
MCHC: 31.8 g/dL — ABNORMAL LOW (ref 32.0–36.0)
MCV: 82.9 fL (ref 80.0–100.0)
MPV: 10.1 fL (ref 7.5–12.5)
Monocytes Relative: 2.5 %
Neutro Abs: 11872 cells/uL — ABNORMAL HIGH (ref 1500–7800)
Neutrophils Relative %: 88.6 %
Platelets: 323 10*3/uL (ref 140–400)
RBC: 5.31 10*6/uL — ABNORMAL HIGH (ref 3.80–5.10)
RDW: 13.7 % (ref 11.0–15.0)
Total Lymphocyte: 8.8 %
WBC: 13.4 10*3/uL — ABNORMAL HIGH (ref 3.8–10.8)

## 2019-06-14 LAB — ANTI-SMITH ANTIBODY: ENA SM Ab Ser-aCnc: <1 AI

## 2019-06-14 LAB — SEDIMENTATION RATE: Sed Rate: 2 mm/h (ref 0–20)

## 2019-06-14 LAB — TEST AUTHORIZATION

## 2019-06-14 LAB — ANTI-DNA ANTIBODY, DOUBLE-STRANDED: ds DNA Ab: 2 IU/mL

## 2019-06-14 LAB — ANTI-NUCLEAR AB-TITER (ANA TITER)
ANA TITER: 1:40 {titer} — ABNORMAL HIGH
ANA Titer 1: 1:40 {titer} — ABNORMAL HIGH

## 2019-06-14 LAB — ANA: Anti Nuclear Antibody (ANA): POSITIVE — AB

## 2019-06-17 ENCOUNTER — Encounter: Payer: Self-pay | Admitting: Internal Medicine

## 2019-06-17 ENCOUNTER — Telehealth: Payer: Self-pay | Admitting: Internal Medicine

## 2019-06-17 MED ORDER — METHYLPREDNISOLONE 4 MG PO TABS: 4.0000 mg | ORAL_TABLET | Freq: Every day | ORAL | 0 refills | Status: DC

## 2019-06-17 NOTE — Telephone Encounter (Signed)
Patient still having urticaria. Is off dose pack and breaking out again. Off diuretic. Needs to see allergist ASAP. She needs to call them tomorrow.Call in one more dose pack today since weather is bad.

## 2019-06-19 NOTE — Patient Instructions (Signed)
Labs drawn and pending.  Use Eucerin cream with triamcinolone cream topically as needed 3 times a day for itching.  Depo-Medrol 80 mg IM given.  Continue Medrol taper prescribed by Dr. Lindi Adie.  Consider allergy evaluation.

## 2019-06-20 ENCOUNTER — Encounter: Payer: Self-pay | Admitting: Hematology and Oncology

## 2019-07-01 ENCOUNTER — Encounter: Payer: Self-pay | Admitting: Allergy and Immunology

## 2019-07-01 ENCOUNTER — Ambulatory Visit (INDEPENDENT_AMBULATORY_CARE_PROVIDER_SITE_OTHER): Payer: 59 | Admitting: Allergy and Immunology

## 2019-07-01 ENCOUNTER — Other Ambulatory Visit: Payer: Self-pay

## 2019-07-01 VITALS — BP 122/82 | HR 100 | Temp 98.0°F | Resp 18 | Ht 68.2 in | Wt 237.2 lb

## 2019-07-01 DIAGNOSIS — L500 Allergic urticaria: Secondary | ICD-10-CM | POA: Diagnosis not present

## 2019-07-01 DIAGNOSIS — K219 Gastro-esophageal reflux disease without esophagitis: Secondary | ICD-10-CM | POA: Diagnosis not present

## 2019-07-01 MED ORDER — SUCRALFATE 1 G PO TABS: ORAL_TABLET | ORAL | 5 refills | Status: DC

## 2019-07-01 NOTE — Progress Notes (Signed)
[place] - [place] - [place] - [place] - [place]   Dear Dr. Renold Genta,  Thank you for referring Rebecca [name] to the [facility] on 07/01/2019.   Below is a summation of this patient's evaluation and recommendations.  Thank you for your referral. I will keep you informed about this patient's response to treatment.   If you have any questions please do not hesitate to contact me.   Sincerely,  Jiles Prows, MD Allergy / [place]   ______________________________________________________________________    NEW PATIENT NOTE  Referring Provider: Elby Showers, MD Primary Provider: Elby Showers, MD Date of office visit: 07/01/2019    Subjective:   Chief Complaint:  Rebecca [name] (DOB: Nov 15, 1970) is a 49 y.o. female who presents to the clinic on 07/01/2019 with a chief complaint of Urticaria .     HPI: Rebecca [name] presents to this clinic in evaluation of her urticaria.  Approximately 6 weeks ago she started to develop red raised itchy lesions on her skin that would wax and wane throughout the day but has been a progressive issue over the course of the past 6 weeks.  She has had global urticaria that is intensely itchy and it is disturbing her lifestyle significantly.  Her lesions never healed with scar or hyperpigmentation.  She has been seen by dermatology and by her primary care doctor and has received what sounds like 6 courses of systemic steroids total during this timeframe.  She does appear to respond to systemic steroids but quickly relapses in regard to her urticaria within days of discontinuing these agents.  She does not have any associated systemic or constitutional symptoms other than the fact that she has chest pain.  She has sternal chest pain that is hot and feels like there is burning and she has epigastric pain.  This has been a progressive issue over  the course of the past several weeks and is much worse after eating.  She does not have any associated shortness of breath or chest tightness.  She does have reflux disease and was using omeprazole 1 time per day on a consistent basis and increased it to twice a day over the course of the past several weeks.  Past Medical History:  Diagnosis Date  . Anxiety   . Breast cancer ([place])   . Diabetes mellitus without complication ([place])   . Diverticulitis   . GERD (gastroesophageal reflux disease)   . Hiatal hernia   . Hypertension   . Hypothyroidism   . LBBB (left bundle branch block)     Past Surgical History:  Procedure Laterality Date  . BREAST RECONSTRUCTION WITH PLACEMENT OF TISSUE EXPANDER AND ALLODERM Bilateral 11/18/2017   Procedure: BILATERAL BREAST RECONSTRUCTION WITH PLACEMENT OF TISSUE EXPANDER AND ALLODERM;  Surgeon: Irene Limbo, MD;  Location: [facility];  Service: Plastics;  Laterality: Bilateral;  . CESAREAN SECTION  2007/2010   x2  . LAPAROSCOPIC TUBAL LIGATION  06/12/2011   Procedure: LAPAROSCOPIC TUBAL LIGATION;  Surgeon: Daria Pastures, MD;  Location: [facility];  Service: Gynecology;  Laterality: N/A;  filshie clip  . LIPOSUCTION WITH LIPOFILLING Bilateral 02/24/2018   Procedure: LIPOFILLING FROM ABDOMEN TO BILATERAL CHEST;  Surgeon: Irene Limbo, MD;  Location: [facility];  Service: Plastics;  Laterality: Bilateral;  . MASTECTOMY W/ SENTINEL NODE BIOPSY Bilateral 11/18/2017   Procedure: BILATERAL TOTAL MASTECTOMIES WITH RIGHT SENTINEL LYMPH  NODE BIOPSY;  Surgeon: Rolm Bookbinder, MD;  Location: [facility];  Service: General;  Laterality: Bilateral;  . mini tuck  2012   abdominal  . PORT-A-CATH REMOVAL Right 01/05/2019  . PORTACATH PLACEMENT Right 07/15/2017   Procedure: INSERTION PORT-A-CATH WITH ULTRASOUND;  Surgeon: Rolm Bookbinder, MD;  Location: [facility];  Service: General;  Laterality: Right;   . REMOVAL OF BILATERAL TISSUE EXPANDERS WITH PLACEMENT OF BILATERAL BREAST IMPLANTS Bilateral 02/24/2018   Procedure: REMOVAL OF BILATERAL TISSUE EXPANDERS WITH PLACEMENT OF BILATERAL BREAST IMPLANTS;  Surgeon: Irene Limbo, MD;  Location: [facility];  Service: Plastics;  Laterality: Bilateral;  . right breast cancer     upper-outer quadrant   . right ear surg  03/2008   Mohs  . TUBAL LIGATION    . US ECHOCARDIOGRAPHY  12-30-2007   EF 55-60%  . WISDOM TOOTH EXTRACTION      Allergies as of 07/01/2019   No Known Allergies     Medication List    BENADRYL ALLERGY PO Take by mouth as needed.   carvedilol 6.25 MG tablet Commonly known as: COREG TAKE 1 TABLET BY MOUTH TWICE A DAY   cetirizine 10 MG tablet Commonly known as: ZYRTEC Take 10 mg by mouth daily.   Entresto 49-51 MG Generic drug: sacubitril-valsartan TAKE 1 TABLET BY MOUTH TWICE A DAY   esomeprazole 40 MG capsule Commonly known as: NEXIUM TAKE 1 CAPSULE (40 MG TOTAL) BY MOUTH DAILY AT 12 NOON.   Farxiga 10 MG Tabs tablet Generic drug: dapagliflozin propanediol Take 10 mg by mouth daily.   levothyroxine 112 MCG tablet Commonly known as: SYNTHROID Take 112 mcg by mouth daily.   LORazepam 1 MG tablet Commonly known as: ATIVAN One po bid prn   MULTIVITAMIN PO Take by mouth. Multivitamin + Omega-3   rosuvastatin 20 MG tablet Commonly known as: CRESTOR TAKE 1 TABLET (20 MG TOTAL) BY MOUTH DAILY. NEEDS AN APPT FOR FURTHER REFILLS   triamcinolone cream 0.1 % Commonly known as: KENALOG Apply 1 application topically 2 (two) times daily. 60 grams mixed 1:1 with 40% eucerin cream  Apply to rash 3 times a day   VITAMIN D3 GUMMIES PO Take 5,000 Units by mouth daily.       Review of systems negative except as noted in HPI / PMHx or noted below:  Review of Systems  Constitutional: Negative.   HENT: Negative.   Eyes: Negative.   Respiratory: Negative.   Cardiovascular: Negative.     Gastrointestinal: Negative.   Genitourinary: Negative.   Musculoskeletal: Negative.   Skin: Negative.   Neurological: Negative.   Endo/Heme/Allergies: Negative.   Psychiatric/Behavioral: Negative.     Family History  Problem Relation Age of Onset  . Lymphoma Father   . Cancer Father        lymphoma  . Diabetes Brother   . Diabetes Mother   . Liver disease Mother        NASH, d. 54  . Asthma Mother   . CAD Maternal Grandmother   . CAD Maternal Grandfather   . Stomach cancer Neg Hx   . Colon cancer Neg Hx     Social History   Socioeconomic History  . Marital status: Married    Spouse name: Not on file  . Number of children: 2  . Years of education: Not on file  . Highest education level: Not on file  Occupational History  . Occupation: Civil Service [employer]  Tobacco Use  . Smoking  status: Former Smoker    Packs/day: 0.25    Years: 6.00    Pack years: 1.50    Types: Cigarettes    Quit date: 03/29/2005    Years since quitting: 14.2  . Smokeless tobacco: Never Used  Substance and Sexual Activity  . Alcohol use: Yes    Comment: socially  . Drug use: No  . Sexual activity: Yes    Birth control/protection: Surgical    Comment: BTL  Other Topics Concern  . Not on file  Social History Narrative   ** Merged History Encounter **       Social Determinants of Health   Financial Resource Strain:   . Difficulty of Paying Living Expenses: Not on file  Food Insecurity:   . Worried About Charity fundraiser in the Last Year: Not on file  . Ran Out of Food in the Last Year: Not on file  Transportation Needs:   . Lack of Transportation (Medical): Not on file  . Lack of Transportation (Non-Medical): Not on file  Physical Activity:   . Days of Exercise per Week: Not on file  . Minutes of Exercise per Session: Not on file  Stress:   . Feeling of Stress : Not on file  Social Connections:   . Frequency of Communication with Friends and Family: Not on file  . Frequency of Social  Gatherings with Friends and Family: Not on file  . Attends Religious Services: Not on file  . Active Member of Clubs or Organizations: Not on file  . Attends Archivist Meetings: Not on file  . Marital Status: Not on file  Intimate Partner Violence:   . Fear of Current or Ex-Partner: Not on file  . Emotionally Abused: Not on file  . Physically Abused: Not on file  . 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  Objective:   Vitals:   07/01/19 0913  BP: 122/82  Pulse: 100  Resp: 18  Temp: 98 F (36.7 C)  SpO2: 97%   Height: 5' 8.2" (173.2 cm) Weight: 237 lb 3.2 oz (107.6 kg)  Physical Exam Constitutional:      Appearance: She is not diaphoretic.  HENT:     Head: Normocephalic.     Right Ear: Tympanic membrane, ear canal and external ear normal.     Left Ear: Tympanic membrane, ear canal and external ear normal.     Nose: Nose normal. No mucosal edema or rhinorrhea.     Mouth/Throat:     Pharynx: Uvula midline. No oropharyngeal exudate.  Eyes:     Conjunctiva/sclera: Conjunctivae normal.  Neck:     Thyroid: No thyromegaly.     Trachea: Trachea normal. No tracheal tenderness or tracheal deviation.  Cardiovascular:     Rate and Rhythm: Normal rate and regular rhythm.     Heart sounds: Normal heart sounds, S1 normal and S2 normal. No murmur.  Pulmonary:     Effort: No respiratory distress.     Breath sounds: Normal breath sounds. No stridor. No wheezing or rales.  Lymphadenopathy:     Head:     Right side of head: No tonsillar adenopathy.     Left side of head: No  tonsillar adenopathy.     Cervical: No cervical adenopathy.  Skin:    Findings: Rash (Diffuse blanching urticarial lesions trunk and  extremities) present. No erythema.     Nails: There is no clubbing.  Neurological:     Mental Status: She is alert.     Diagnostics: Allergy skin tests were performed.  She did not demonstrate any hypersensitivity against a screening panel of aeroallergens or foods.  Review of blood tests obtained to February 2021 identified WBC 13.4, absolute eosinophils 0, absolute lymphocyte 1179, hemoglobin 14.0, platelet 323, positive ANA with titer 1: 40 and negative ENA.  Assessment and Plan:    1. Allergic urticaria   2. Gastroesophageal reflux disease, unspecified whether esophagitis present     1.  Allergen avoidance measures?  2.  Cetirizine 10 mg -1-2 tablets 2 times per day (MAX=40mg )  3.  Famotidine 10 mg - 1 tablet 2 times per day  4.  Montelukast 10 mg - 1 tablet 1 time per day  5.  Xyzal 5mg  - 2 tablet in clinic today  6.  Can add benadryl if needed  7.  Can add Carafate 1 g 3 times per day if needed  8.  Blood - CMP, TSH, FT4, TP, alpha-gal panel  9. Further evaluation and treatment?  10. Return to clinic in 4 weeks or earlier if problem  11. 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  We will screen her blood looking for a worrisome systemic disease.  She will utilize a collection of medications in the hope of minimizing her immunological hyperreactivity but we will hold off on the administration of any systemic steroids at this point.  I think that systemic steroids have really exacerbated her reflux significantly and I suspect she has a fair amount of esophageal irritation at this point.  We will add in Carafate and an H2 receptor blocker to her reflux therapy.  Assuming she does okay on this plan I will see her back in this clinic in 4 weeks or earlier if there is a problem.  Jiles Prows, MD Allergy / Immunology [facility]

## 2019-07-01 NOTE — Patient Instructions (Addendum)
  1.  Allergen avoidance measures?  2.  Cetirizine 10 mg -1-2 tablets 2 times per day (MAX=40mg )  3.  Famotidine 10 mg - 1 tablet 2 times per day  4.  Montelukast 10 mg - 1 tablet 1 time per day  5.  Xyzal 5mg  - 2 tablet in clinic today  6.  Can add benadryl if needed  7.  Can add Carafate 1 g 3 times per day if needed  8.  Blood - CMP, TSH, FT4, TP, alpha-gal panel  9. Further evaluation and treatment?  10. Return to clinic in 4 weeks or earlier if problem  11. No more systemic steroids

## 2019-07-05 ENCOUNTER — Encounter: Payer: Self-pay | Admitting: Allergy and Immunology

## 2019-07-05 ENCOUNTER — Other Ambulatory Visit: Payer: BC Managed Care – PPO

## 2019-07-06 ENCOUNTER — Ambulatory Visit: Payer: No Typology Code available for payment source

## 2019-07-06 ENCOUNTER — Other Ambulatory Visit: Payer: Self-pay

## 2019-07-06 DIAGNOSIS — R0609 Other forms of dyspnea: Secondary | ICD-10-CM

## 2019-07-06 DIAGNOSIS — I427 Cardiomyopathy due to drug and external agent: Secondary | ICD-10-CM

## 2019-07-08 LAB — COMPREHENSIVE METABOLIC PANEL
ALT: 24 IU/L (ref 0–32)
AST: 13 IU/L (ref 0–40)
Albumin/Globulin Ratio: 2.1 (ref 1.2–2.2)
Albumin: 3.9 g/dL (ref 3.8–4.8)
Alkaline Phosphatase: 66 IU/L (ref 39–117)
BUN/Creatinine Ratio: 17 (ref 9–23)
BUN: 14 mg/dL (ref 6–24)
Bilirubin Total: 0.2 mg/dL (ref 0.0–1.2)
CO2: 28 mmol/L (ref 20–29)
Calcium: 9.1 mg/dL (ref 8.7–10.2)
Chloride: 102 mmol/L (ref 96–106)
Creatinine, Ser: 0.83 mg/dL (ref 0.57–1.00)
GFR calc Af Amer: 96 mL/min/{1.73_m2} (ref >59)
GFR calc non Af Amer: 83 mL/min/{1.73_m2} (ref >59)
Globulin, Total: 1.9 g/dL (ref 1.5–4.5)
Glucose: 114 mg/dL — ABNORMAL HIGH (ref 65–99)
Potassium: 4.6 mmol/L (ref 3.5–5.2)
Sodium: 143 mmol/L (ref 134–144)
Total Protein: 5.8 g/dL — ABNORMAL LOW (ref 6.0–8.5)

## 2019-07-08 LAB — ALPHA-GAL PANEL
Alpha Gal IgE*: <0.1 kU/L (ref <0.10)
Beef (Bos spp) IgE: <0.1 kU/L (ref <0.35)
Class Interpretation: 0
Class Interpretation: 0
Class Interpretation: 0
Lamb/Mutton (Ovis spp) IgE: <0.1 kU/L (ref <0.35)
Pork (Sus spp) IgE: <0.1 kU/L (ref <0.35)

## 2019-07-08 LAB — TSH+FREE T4
Free T4: 1.34 ng/dL (ref 0.82–1.77)
TSH: 1.63 u[IU]/mL (ref 0.450–4.500)

## 2019-07-08 LAB — THYROID PEROXIDASE ANTIBODY: Thyroperoxidase Ab SerPl-aCnc: <9 IU/mL (ref 0–34)

## 2019-07-12 ENCOUNTER — Other Ambulatory Visit: Payer: Self-pay | Admitting: Cardiology

## 2019-07-14 ENCOUNTER — Encounter: Payer: Self-pay | Admitting: Cardiology

## 2019-07-14 ENCOUNTER — Ambulatory Visit: Payer: No Typology Code available for payment source | Admitting: Cardiology

## 2019-07-14 ENCOUNTER — Other Ambulatory Visit: Payer: Self-pay

## 2019-07-14 VITALS — BP 111/72 | HR 69 | Temp 98.7°F | Ht 68.0 in | Wt 234.0 lb

## 2019-07-14 DIAGNOSIS — I10 Essential (primary) hypertension: Secondary | ICD-10-CM

## 2019-07-14 DIAGNOSIS — I427 Cardiomyopathy due to drug and external agent: Secondary | ICD-10-CM

## 2019-07-14 DIAGNOSIS — I5022 Chronic systolic (congestive) heart failure: Secondary | ICD-10-CM

## 2019-07-14 DIAGNOSIS — E782 Mixed hyperlipidemia: Secondary | ICD-10-CM

## 2019-07-14 DIAGNOSIS — I447 Left bundle-branch block, unspecified: Secondary | ICD-10-CM

## 2019-07-14 MED ORDER — CARVEDILOL 12.5 MG PO TABS: 12.5000 mg | ORAL_TABLET | Freq: Two times a day (BID) | ORAL | 1 refills | Status: DC

## 2019-07-14 NOTE — Progress Notes (Signed)
Primary Physician/Referring:  Elby Showers, MD  Patient ID: Rebecca [name], female    DOB: 30-Apr-1970, 49 y.o.   MRN: [id number]  Chief Complaint  Patient presents with  . Cardiomyopathy  . Follow-up    3 month   HPI:    Rebecca [name]  is a 49 y.o. Caucasian female initially with history of LBBB at least dating back to 2013. At that time she has had a negative nuclear stress test and echocardiogram revealed low normal LVEF at 45-50%. Past medical history includes hyperthyroidism S/P RAI therapy in March 2018, depression, chronic low back pain, estrogen negative right breast cancer for which he underwent chemotherapy(Adjuvant chemotherapy with dose dense Adriamycin and Cytoxan x4)  and also bilateral mastectomy with reconstruction surgery on 11/18/2017. Surveillance echocardiogram due to chemotherapy attributed gradual decrease of LVEF to moderate to severe LV systolic dysfunction of 30 to 35%. 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     She is tolerating her medications well, resume normal activities and denies any further chest pains or dyspnea except when she bends down.  No PND or orthopnea or leg edema. She had developed hives and was treated with steroid dose pack and now seeing an allergist.   Past Medical History:  Diagnosis Date  . Anxiety   . Breast cancer ([place])   . Diabetes mellitus without complication ([place])   . Diverticulitis   . GERD (gastroesophageal reflux disease)   . Hiatal hernia   . Hypertension   . Hypothyroidism   . LBBB (left bundle branch block)    Past Surgical History:  Procedure Laterality Date  . BREAST RECONSTRUCTION WITH PLACEMENT OF TISSUE EXPANDER AND ALLODERM Bilateral 11/18/2017   Procedure: BILATERAL BREAST RECONSTRUCTION WITH PLACEMENT OF TISSUE EXPANDER AND ALLODERM;  Surgeon: Irene Limbo, MD;  Location: [facility];  Service: Plastics;  Laterality: Bilateral;  . CESAREAN SECTION  2007/2010    x2  . LAPAROSCOPIC TUBAL LIGATION  06/12/2011   Procedure: LAPAROSCOPIC TUBAL LIGATION;  Surgeon: Daria Pastures, MD;  Location: [facility];  Service: Gynecology;  Laterality: N/A;  filshie clip  . LIPOSUCTION WITH LIPOFILLING Bilateral 02/24/2018   Procedure: LIPOFILLING FROM ABDOMEN TO BILATERAL CHEST;  Surgeon: Irene Limbo, MD;  Location: [facility];  Service: Plastics;  Laterality: Bilateral;  . MASTECTOMY W/ SENTINEL NODE BIOPSY Bilateral 11/18/2017   Procedure: BILATERAL TOTAL MASTECTOMIES WITH RIGHT SENTINEL LYMPH NODE BIOPSY;  Surgeon: Rolm Bookbinder, MD;  Location: [facility];  Service: General;  Laterality: Bilateral;  . mini tuck  2012   abdominal  . PORT-A-CATH REMOVAL Right 01/05/2019  . PORTACATH PLACEMENT Right 07/15/2017   Procedure: INSERTION PORT-A-CATH WITH ULTRASOUND;  Surgeon: Rolm Bookbinder, MD;  Location: [facility];  Service: General;  Laterality: Right;  . REMOVAL OF BILATERAL TISSUE EXPANDERS WITH PLACEMENT OF BILATERAL BREAST IMPLANTS Bilateral 02/24/2018   Procedure: REMOVAL OF BILATERAL TISSUE EXPANDERS WITH PLACEMENT OF BILATERAL BREAST IMPLANTS;  Surgeon: Irene Limbo, MD;  Location: [facility];  Service: Plastics;  Laterality: Bilateral;  . right breast cancer     upper-outer quadrant   . right ear surg  03/2008   Mohs  . TUBAL LIGATION    . US ECHOCARDIOGRAPHY  12-30-2007   EF 55-60%  . WISDOM TOOTH EXTRACTION      Social History   Tobacco Use  . Smoking status: Former Smoker    Packs/day: 0.25    Years: 6.00  Pack years: 1.50    Types: Cigarettes    Quit date: 03/29/2005    Years since quitting: 14.3  . Smokeless tobacco: Never Used  Substance Use Topics  . Alcohol use: Yes    Comment: socially    ROS  Review of Systems  Cardiovascular: Negative for chest pain, dyspnea on exertion and leg swelling.  Gastrointestinal: Negative for melena.  Psychiatric/Behavioral:  Positive for depression. The patient is nervous/anxious.    Objective   Vitals with BMI 07/14/2019 07/01/2019 06/07/2019  Height 5\' 8"  5' 8.2" 5\' 10"   Weight 234 lbs 237 lbs 3 oz 227 lbs  BMI 35.59 [id number]  Systolic [id number]  Diastolic 72 82 70  Pulse 69 100 80    Blood pressure 111/72, pulse 69, temperature 98.7 F (37.1 C), height 5\' 8"  (1.727 m), weight 234 lb (106.1 kg), SpO2 99 %. Body mass index is 35.58 kg/m.   Physical Exam  Constitutional:  Well built and mildly obese in no acute distress  Cardiovascular: Normal rate, regular rhythm, normal heart sounds and intact distal pulses. Exam reveals no gallop.  No murmur heard. NO JVD. NO leg edema  Pulmonary/Chest: Effort normal and breath sounds normal.  Abdominal: Soft. Bowel sounds are normal.  Musculoskeletal:     Cervical back: Neck supple.   Radiology: No results found.  Laboratory examination:   Recent Labs    01/27/19 0820 03/19/19 1243 07/02/19 0811  NA 139 138 143  K 4.9 4.6 4.6  CL 99 102 102  CO2 26 26 28   GLUCOSE 122* 114* 114*  BUN 19 20 14   CREATININE 1.08* 1.05* 0.83  CALCIUM 9.9 9.1 9.1  GFRNONAA 60 >60 83  GFRAA 70 >60 96   CMP Latest Ref Rng & Units 07/02/2019 03/19/2019 01/27/2019  Glucose 65 - 99 mg/dL 114(H) 114(H) 122(H)  BUN 6 - 24 mg/dL 14 20 19   Creatinine 0.57 - 1.00 mg/dL 0.83 1.05(H) 1.08(H)  Sodium 134 - 144 mmol/L 143 138 139  Potassium 3.5 - 5.2 mmol/L 4.6 4.6 4.9  Chloride 96 - 106 mmol/L 102 102 99  CO2 20 - 29 mmol/L 28 26 26   Calcium 8.7 - 10.2 mg/dL 9.1 9.1 9.9  Total Protein 6.0 - 8.5 g/dL 5.8(L) 7.3 7.2  Total Bilirubin 0.0 - 1.2 mg/dL 0.2 0.3 0.5  Alkaline Phos 39 - 117 IU/L 66 82 92  AST 0 - 40 IU/L 13 20 16   ALT 0 - 32 IU/L 24 26 21    CBC Latest Ref Rng & Units 06/07/2019 12/29/2018 11/30/2018  WBC 3.8 - 10.8 Thousand/uL 13.4(H) 5.8 6.8  Hemoglobin 11.7 - 15.5 g/dL 14.0 12.0 11.8  Hematocrit 35.0 - 45.0 % 44.0 39.1 36.7  Platelets 140 - 400 Thousand/uL 323 294  326   Lipid Panel     Component Value Date/Time   CHOL 154 01/27/2019 0820   TRIG 158 (H) 01/27/2019 0820   HDL 43 01/27/2019 0820   CHOLHDL 3.6 01/27/2019 0820   CHOLHDL 3.8 11/30/2018 0921   VLDL 59 (H) 02/17/2018 1045   LDLCALC 84 01/27/2019 0820   LDLCALC 97 11/30/2018 0921   HEMOGLOBIN A1C Lab Results  Component Value Date   HGBA1C 6.4 (H) 03/19/2019   MPG 136.98 03/19/2019   TSH Recent Labs    11/30/18 0921 03/19/19 1242 07/02/19 0811  TSH 1.53 0.087* 1.630   Medications    Current Outpatient Medications  Medication Instructions  . carvedilol (COREG) 12.5 mg, Oral, 2 times daily  .  cetirizine (ZYRTEC) 10 mg, Oral, Daily  . Cholecalciferol (VITAMIN D3 GUMMIES PO) 5,000 Units, Oral, Daily  . diphenhydrAMINE HCl (BENADRYL ALLERGY PO) Oral, As needed  . ENTRESTO 49-51 MG TAKE 1 TABLET BY MOUTH TWICE A DAY  . esomeprazole (NEXIUM) 40 mg, Oral, Daily  . famotidine (PEPCID) 20 mg, Oral, Daily  . Farxiga 10 mg, Oral, Daily  . levothyroxine (SYNTHROID) 112 mcg, Oral, Daily  . LORazepam (ATIVAN) 1 MG tablet One po bid prn  . montelukast (SINGULAIR) 10 mg, Oral, Daily  . Multiple Vitamin (MULTIVITAMIN PO) Oral, Multivitamin + Omega-3   . rosuvastatin (CRESTOR) 20 mg, Oral, Daily, Needs an appt for further refills  . sucralfate (CARAFATE) 1 g tablet Can take one tablet by mouth three times daily if needed.  . triamcinolone cream (KENALOG) 0.1 % 1 application, Topical, 2 times daily, 60 grams mixed 1:1 with 40% eucerin cream Apply to rash 3 times a day    Cardiac Studies:   Coronary CTA 01/01/2019: 1. Coronary artery calcium score 50 Agatston units. This places the patient in the 97th percentile for age and gender, suggesting high risk for future cardiac events. 2.  Nonobstructive coronary disease. 3. The RCA originates from the left cusp in close proximity to the left coronary and courses interarterially between the aortic root and proximal PA to reach typical RCA  territory. Would consider treadmill stress testing to assess for provocative ischemia. 4. LAD system: Mixed plaque in the proximal LAD with mild (<50%) stenosis. Circumflex system: Large PLOM, small AV LCx after take-off of PLOM. No plaque or stenosis.  Echocardiogram 07/06/2019:  Moderately depressed LV systolic function with visual EF 30-35%. Left  ventricle cavity is dilated. Left ventricle regional wall motion findings:  Basal anteroseptal, Basal anterior, Basal inferoseptal, Mid anteroseptal,  Mid anterior, Mid inferoseptal, Apical anterior, Apical septal and Apical  cap hypokinesis. Calculated EF 43%.  Mild (Grade I) aortic regurgitation.  Mild (Grade I) mitral regurgitation.  No other significant valvular abnormalities.  Consider limited echo with Definity or MUGA scan to re-evaluate left  ventricular function, if clinically indicated.   No significant change compared to prior study on 03/2019.   EKG: EKG 07/14/2019: Normal sinus rhythm at rate of 71 bpm, normal axis, left bundle branch block.  No further analysis.  QRS duration 150 ms.  Compared to 01/13/2019, no significant change, heart rate has reduced from 83 bpm.  Assessment     ICD-10-CM   1. Essential hypertension  I10 PCV ECHOCARDIOGRAM COMPLETE  2. 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 Neoadjuvant chemotherapy with TCH Perjeta 6 cycles followed by Herceptinand Perjetamaintenance for 1 year 2. Followed bybilateral mastectomies with targeted node dissection on the right7/23/2019 3.Due to significant residual disease, additional adjuvant chemo with Adriamycin and Cytoxan 12/18/2017-01/27/2018 4.Herceptin maintenance completed 09/29/2018.  5. Patient  in remission and under surveillance.   - Dr. Lupe Carney, MD (Onc)  Recommendations:   Rebecca [name]  is a 49 y.o. Caucasian female initially with history of LBBB at least dating back to 2013, low normal LVEF at 45-50%. Past medical history includes hyperthyroidism S/P RAI therapy in March 2018, hyperlipidemia, DM, minimal aortic and coronary atherosclerosis  by CTA in 2020, depression, chronic low back pain, estrogen negative right breast cancer for which he underwent chemotherapy(Adjuvant chemotherapy with dose dense Adriamycin and Cytoxan x4)  and also bilateral mastectomy with reconstruction surgery on 11/18/2017. Surveillance echocardiogram due to chemotherapy attributed gradual decrease of LVEF to moderate to severe LV systolic dysfunction of 30 to 35%. 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     Will increase carvedilol from 6.25 mg p.o. b.i.d. to 12.5 mg BID gradually, will increase Entresto to maximum dose of  97/103 mg b.i.d. once she tolerates Coreg. Heart rate now reduced below 70 with the present therapy. Recheck echo in 3 months. Reassured her in the absence of symptoms and also that the LVEF by echo now stable instead of gradually decreasing is very reassuring that her cardiotoxicity may have reached the peak and she is well compensated.   BP is controlled and she is presently tolerating moderate dose Coreg at 6.25 BID and Entresto at 49/51. She will call me once she titrate coreg up so I can sent Rx for highest dose entresto.   I have discussed with her regarding making lifestyle changes. Weight loss discussed.  LDL is improved, and will continue to reinforce lifestyle change to improve TG.    Patient was extremely anxious and I spent 40 minutes with her and reassured her. I wal planning for GXT to exclude inducible arrhythmias in view of anomalous RCA, due to her anxiety, but she now fees  Reassured. I also do not think she needs an ICD (non ischemic CMP).  OV in 3 months.   Hives resolved. On beta blocker therapy, no anaphylaxis and hence continue BB.   Adrian Prows, MD, [facility] 07/15/2019, [facility]. [place] Office: [phone]   CC: Nicholas Lose, MD

## 2019-07-20 ENCOUNTER — Ambulatory Visit: Payer: 59 | Admitting: Allergy and Immunology

## 2019-07-23 ENCOUNTER — Ambulatory Visit: Payer: 59 | Admitting: Internal Medicine

## 2019-07-25 ENCOUNTER — Encounter (HOSPITAL_BASED_OUTPATIENT_CLINIC_OR_DEPARTMENT_OTHER): Payer: Self-pay | Admitting: Emergency Medicine

## 2019-07-25 ENCOUNTER — Encounter: Payer: Self-pay | Admitting: Emergency Medicine

## 2019-07-25 ENCOUNTER — Emergency Department
Admission: EM | Admit: 2019-07-25 | Discharge: 2019-07-25 | Disposition: A | Discharge status: Home / Self Care | Payer: 59 | Source: Home / Self Care | Attending: Family Medicine | Admitting: Family Medicine

## 2019-07-25 ENCOUNTER — Other Ambulatory Visit: Payer: Self-pay | Admitting: Internal Medicine

## 2019-07-25 ENCOUNTER — Other Ambulatory Visit: Payer: Self-pay

## 2019-07-25 ENCOUNTER — Emergency Department (HOSPITAL_BASED_OUTPATIENT_CLINIC_OR_DEPARTMENT_OTHER)
Admission: EM | Admit: 2019-07-25 | Discharge: 2019-07-25 | Disposition: A | Discharge status: Home / Self Care | Payer: 59 | Attending: Emergency Medicine | Admitting: Emergency Medicine

## 2019-07-25 DIAGNOSIS — L509 Urticaria, unspecified: Secondary | ICD-10-CM | POA: Insufficient documentation

## 2019-07-25 DIAGNOSIS — L508 Other urticaria: Secondary | ICD-10-CM

## 2019-07-25 DIAGNOSIS — Z5321 Procedure and treatment not carried out due to patient leaving prior to being seen by health care provider: Secondary | ICD-10-CM | POA: Diagnosis not present

## 2019-07-25 HISTORY — DX: Atherosclerotic heart disease of native coronary artery without angina pectoris: I25.10

## 2019-07-25 LAB — POCT CBC W AUTO DIFF (K'VILLE URGENT CARE)

## 2019-07-25 MED ORDER — HYDROXYZINE HCL 50 MG PO TABS: ORAL_TABLET | ORAL | 1 refills | Status: DC

## 2019-07-25 MED ORDER — PREDNISONE 20 MG PO TABS: ORAL_TABLET | ORAL | 0 refills | Status: DC

## 2019-07-25 MED ORDER — METHYLPREDNISOLONE ACETATE 80 MG/ML IJ SUSP
80.0000 mg | Freq: Once | INTRAMUSCULAR | Status: AC
  Administered 2019-07-25: 80 mg via INTRAMUSCULAR

## 2019-07-25 NOTE — Discharge Instructions (Addendum)
Continue Zyrtec and Pepcid daytime for itching. May begin prednisone if rash recurs after about one week. Followup with cardiologist as scheduled.

## 2019-07-25 NOTE — ED Provider Notes (Signed)
[name]    CSN: [id number] Arrival date & time: 07/25/19  1334      History   Chief Complaint Chief Complaint  Patient presents with  . Rash    HPI Rebecca [name] is a 49 y.o. female.   Patient has approximately 2 month history of recurring urticaria, having recently undergone a thorough allergy evaluation.  She has had several courses of prednisone with resolution, but hives tend to recur.  Her last treatment with prednisone was approximately one month ago.  Review of records reveals a CBC 7 weeks ago showing leukocytosis (WBC 13.4), most likely a result of steroid treatment.  Her hives have worsened during the past several days. She has a history of right breast CA, s/p bilateral mastectomy and adjuvant chemotherapy.  She subsequently developed chemotherapy induced cardiotoxicity, recently tolerating increased dose of carvedilol and Entresto. She frequently feels anxious, but feels well otherwise  The history is provided by the patient.    Past Medical History:  Diagnosis Date  . Anxiety   . Breast cancer ([place])   . Coronary artery disease   . Diabetes mellitus without complication ([place])   . Diverticulitis   . GERD (gastroesophageal reflux disease)   . Hiatal hernia   . Hypertension   . Hypothyroidism   . LBBB (left bundle branch block)     Patient Active Problem List   Diagnosis Date Noted  . Breast cancer, right ([place]) 11/18/2017  . Genetic testing 08/19/2017  . Port-A-Cath in place 08/08/2017  . Malignant neoplasm of upper-outer quadrant of right breast in female, estrogen receptor negative ([place]) 07/14/2017  . Dependent edema 10/23/2015  . Episodic paroxysmal anxiety disorder 10/23/2015  . Attention deficit disorder 10/23/2015  . Low TSH level 10/23/2015  . Obesity 10/23/2015  . History of abdominoplasty 10/23/2015  . Chest pain 09/09/2011    Past Surgical History:  Procedure Laterality Date  . BREAST RECONSTRUCTION WITH PLACEMENT OF  TISSUE EXPANDER AND ALLODERM Bilateral 11/18/2017   Procedure: BILATERAL BREAST RECONSTRUCTION WITH PLACEMENT OF TISSUE EXPANDER AND ALLODERM;  Surgeon: Irene Limbo, MD;  Location: [facility];  Service: Plastics;  Laterality: Bilateral;  . CESAREAN SECTION  2007/2010   x2  . LAPAROSCOPIC TUBAL LIGATION  06/12/2011   Procedure: LAPAROSCOPIC TUBAL LIGATION;  Surgeon: Daria Pastures, MD;  Location: [facility];  Service: Gynecology;  Laterality: N/A;  filshie clip  . LIPOSUCTION WITH LIPOFILLING Bilateral 02/24/2018   Procedure: LIPOFILLING FROM ABDOMEN TO BILATERAL CHEST;  Surgeon: Irene Limbo, MD;  Location: [facility];  Service: Plastics;  Laterality: Bilateral;  . MASTECTOMY W/ SENTINEL NODE BIOPSY Bilateral 11/18/2017   Procedure: BILATERAL TOTAL MASTECTOMIES WITH RIGHT SENTINEL LYMPH NODE BIOPSY;  Surgeon: Rolm Bookbinder, MD;  Location: [facility];  Service: General;  Laterality: Bilateral;  . mini tuck  2012   abdominal  . PORT-A-CATH REMOVAL Right 01/05/2019  . PORTACATH PLACEMENT Right 07/15/2017   Procedure: INSERTION PORT-A-CATH WITH ULTRASOUND;  Surgeon: Rolm Bookbinder, MD;  Location: [facility];  Service: General;  Laterality: Right;  . REMOVAL OF BILATERAL TISSUE EXPANDERS WITH PLACEMENT OF BILATERAL BREAST IMPLANTS Bilateral 02/24/2018   Procedure: REMOVAL OF BILATERAL TISSUE EXPANDERS WITH PLACEMENT OF BILATERAL BREAST IMPLANTS;  Surgeon: Irene Limbo, MD;  Location: [facility];  Service: Plastics;  Laterality: Bilateral;  . right breast cancer     upper-outer quadrant   . right ear surg  03/2008   Mohs  . TUBAL LIGATION    .  US ECHOCARDIOGRAPHY  12-30-2007   EF 55-60%  . WISDOM TOOTH EXTRACTION      OB History   No obstetric history on file.      Home Medications    Prior to Admission medications   Medication Sig Start Date End Date Taking? Authorizing Provider  venlafaxine  (EFFEXOR) 75 MG tablet Take 75 mg by mouth 2 (two) times daily.   Yes [provider]  carvedilol (COREG) 12.5 MG tablet Take 1 tablet (12.5 mg total) by mouth 2 (two) times daily. 07/14/19   Adrian Prows, MD  cetirizine (ZYRTEC) 10 MG tablet Take 10 mg by mouth daily.    [provider]  Cholecalciferol (VITAMIN D3 GUMMIES PO) Take 5,000 Units by mouth daily.    [provider]  dapagliflozin propanediol (FARXIGA) 10 MG TABS tablet Take 10 mg by mouth daily.    [provider]  diphenhydrAMINE HCl (BENADRYL ALLERGY PO) Take by mouth as needed.    [provider]  ENTRESTO 49-51 MG TAKE 1 TABLET BY MOUTH TWICE A DAY 07/12/19   Adrian Prows, MD  esomeprazole (NEXIUM) 40 MG capsule TAKE 1 CAPSULE (40 MG TOTAL) BY MOUTH DAILY AT 12 NOON. 03/15/19   Nicholas Lose, MD  famotidine (PEPCID) 20 MG tablet Take 20 mg by mouth daily.    [provider]  hydrOXYzine (ATARAX/VISTARIL) 50 MG tablet Take one or two PO HS PRN itching and rash 07/25/19   Kandra Nicolas, MD  levothyroxine (SYNTHROID) 112 MCG tablet Take 112 mcg by mouth daily. 06/23/19   [provider]  LORazepam (ATIVAN) 1 MG tablet One po bid prn 05/27/19   Elby Showers, MD  montelukast (SINGULAIR) 10 MG tablet Take 10 mg by mouth daily. 06/27/19   [provider]  Multiple Vitamin (MULTIVITAMIN PO) Take by mouth. Multivitamin + Omega-3    [provider]  predniSONE (DELTASONE) 20 MG tablet Take one tab by mouth twice daily for 4 days, then one daily for 3 days. Take with food. 07/25/19   Kandra Nicolas, MD  rosuvastatin (CRESTOR) 20 MG tablet TAKE 1 TABLET (20 MG TOTAL) BY MOUTH DAILY. NEEDS AN APPT FOR FURTHER REFILLS 06/07/19   Bensimhon, Shaune Pascal, MD  sucralfate (CARAFATE) 1 g tablet Can take one tablet by mouth three times daily if needed. 07/01/19   Kozlow, Donnamarie Poag, MD  triamcinolone cream (KENALOG) 0.1 % Apply 1 application topically 2 (two) times daily. 60 grams mixed  1:1 with 40% eucerin cream  Apply to rash 3 times a day    [provider]    Family History Family History  Problem Relation Age of Onset  . Lymphoma Father   . Cancer Father        lymphoma  . Diabetes Brother   . Diabetes Mother   . Liver disease Mother        NASH, d. 68  . Asthma Mother   . CAD Maternal Grandmother   . CAD Maternal Grandfather   . Stomach cancer Neg Hx   . Colon cancer Neg Hx     Social History Social History   Tobacco Use  . Smoking status: Former Smoker    Packs/day: 0.25    Years: 6.00    Pack years: 1.50    Types: Cigarettes    Quit date: 03/29/2005    Years since quitting: 14.3  . Smokeless tobacco: Never Used  Substance Use Topics  . Alcohol use: Yes  Comment: socially  . Drug use: No     Allergies   Patient has no known allergies.   Review of Systems Review of Systems  Constitutional: Negative for activity change, appetite change, chills, diaphoresis, fatigue and fever.  Skin: Positive for rash.  Neurological: Negative for dizziness, syncope and light-headedness.  Psychiatric/Behavioral:       Anxiety  All other systems reviewed and are negative.    Physical Exam Triage Vital Signs ED Triage Vitals  Enc Vitals Group     BP 07/25/19 1347 (!) 91/6     Pulse Rate 07/25/19 1347 78     Resp 07/25/19 1347 18     Temp 07/25/19 1347 97.7 F (36.5 C)     Temp Source 07/25/19 1347 Oral     SpO2 07/25/19 1347 96 %     Weight 07/25/19 1349 228 lb (103.4 kg)     Height 07/25/19 1349 5\' 9"  (1.753 m)     Head Circumference --      Peak Flow --      Pain Score --      Pain Loc --      Pain Edu? --      Excl. in [place]? --    No data found.  Updated Vital Signs BP (!) 89/56   Pulse 78   Temp 97.7 F (36.5 C) (Oral)   Resp 18   Ht 5\' 9"  (1.753 m)   Wt 103.4 kg   SpO2 96%   BMI 33.67 kg/m   Visual Acuity Right Eye Distance:   Left Eye Distance:   Bilateral Distance:    Right Eye Near:   Left Eye Near:      Bilateral Near:     Physical Exam Vitals and nursing note reviewed.  Constitutional:      General: She is not in acute distress.    Appearance: She is not ill-appearing.  HENT:     Head: Normocephalic.     Nose: Nose normal.     Mouth/Throat:     Pharynx: Oropharynx is clear.  Eyes:     Conjunctiva/sclera: Conjunctivae normal.     Pupils: Pupils are equal, round, and reactive to light.  Cardiovascular:     Heart sounds: Normal heart sounds.  Pulmonary:     Breath sounds: Normal breath sounds.  Abdominal:     Tenderness: There is no abdominal tenderness.  Musculoskeletal:     Right lower leg: No edema.     Left lower leg: No edema.  Lymphadenopathy:     Cervical: No cervical adenopathy.  Skin:    General: Skin is warm and dry.     Comments: Scattered urticarial lesions, most prominent on back.  Neurological:     Mental Status: She is alert.      UC Treatments / Results  Labs (all labs ordered are listed, but only abnormal results are displayed) Labs Reviewed  POCT CBC W AUTO DIFF ([place]):  WBC 7.9; LY 25.2; MO 1.4; GR 73.4; Hgb 13.4; Platelets 331     EKG   Radiology No results found.  Procedures Procedures (including critical care time)  Medications Ordered in UC Medications  methylPREDNISolone acetate (DEPO-MEDROL) injection 80 mg (80 mg Intramuscular Given 07/25/19 1505)    Initial Impression / Assessment and Plan / UC Course  I have reviewed the triage vital signs and the nursing notes.  Pertinent labs & imaging results that were available during my care of the patient were reviewed by  me and considered in my medical decision making (see chart for details).    Urticaria unknown etiology; patient followed by allergist. Note normal WBC today. Administered Depo Medrol 80mg  IM. Rx for prednisone 20mg  to hold. Trial of Vistaril at bedtime. Followup with PCP and cardiologist as scheduled.   Final Clinical Impressions(s) / UC Diagnoses    Final diagnoses:  Recurrent urticaria     Discharge Instructions     Continue Zyrtec and Pepcid daytime for itching. May begin prednisone if rash recurs after about one week. Followup with cardiologist as scheduled.   ED Prescriptions    Medication Sig Dispense Auth. Provider   predniSONE (DELTASONE) 20 MG tablet Take one tab by mouth twice daily for 4 days, then one daily for 3 days. Take with food. 11 tablet Kandra Nicolas, MD   hydrOXYzine (ATARAX/VISTARIL) 50 MG tablet Take one or two PO HS PRN itching and rash 15 tablet Kandra Nicolas, MD        Kandra Nicolas, MD 07/25/19 2115

## 2019-07-25 NOTE — ED Triage Notes (Signed)
Pt reports diffuse hives intermittently x 2 months. Has seen an allergist. This time she has had hives x 1 week.

## 2019-07-25 NOTE — ED Notes (Signed)
Patient states her BP has low since starting on carvediol; she does not feel dizzy.

## 2019-07-25 NOTE — ED Triage Notes (Signed)
Patient here for recurring hives; past 9 weeks intermittent severity; has taken a few courses of prednisone by mouth and injection; held off taking zyrtec and benadryl today until after evaluation. Spoke with cardiologist today who said to come to [facility] for more prednisone type therapy and later this week will revisit current medications for causative possibilities. Feels she is having heightened anxiety today.

## 2019-07-26 ENCOUNTER — Ambulatory Visit (INDEPENDENT_AMBULATORY_CARE_PROVIDER_SITE_OTHER): Payer: 59 | Admitting: *Deleted

## 2019-07-26 ENCOUNTER — Telehealth: Payer: Self-pay | Admitting: Allergy and Immunology

## 2019-07-26 ENCOUNTER — Encounter: Payer: Self-pay | Admitting: *Deleted

## 2019-07-26 ENCOUNTER — Encounter: Payer: Self-pay | Admitting: Allergy and Immunology

## 2019-07-26 DIAGNOSIS — L501 Idiopathic urticaria: Secondary | ICD-10-CM | POA: Diagnosis not present

## 2019-07-26 MED ORDER — OMALIZUMAB 150 MG ~~LOC~~ SOLR
300.0000 mg | SUBCUTANEOUS | Status: DC
  Administered 2019-07-26 – 2019-12-20 (×6): 300 mg via SUBCUTANEOUS

## 2019-07-26 MED ORDER — EPINEPHRINE 0.3 MG/0.3ML IJ SOAJ: INTRAMUSCULAR | 3 refills | Status: AC

## 2019-07-26 NOTE — Telephone Encounter (Signed)
Please have patient come to [facility] clinic today for an injection of omalizumab and do not use the prednisone prescribed by the urgent care center.

## 2019-07-26 NOTE — Telephone Encounter (Signed)
Patient called and said the she went to urgent care in [facility] yesterday because she had hives . She said that she has had them off and on for 9 weeks now. They gave her a depo medrol shot and she said it did not do any thing. He gave her a rx for predisone but did not know if you want her to take it ? [phone]

## 2019-07-26 NOTE — Progress Notes (Signed)
Rebecca [name] started Xolair today for her urticaria. She will continue to receive 300mg  every 4 weeks. Consent signed and Epipen sent.  Per Dr. Neldon Mc - O.K. to give her Benadryl for her itching/hives - 50 mg of Benadryl administered in clinic.

## 2019-07-26 NOTE — Telephone Encounter (Signed)
Patient scheduled to come in this afternoon. Also informed her not to take the prednisone as prescribed by the urgent care center.

## 2019-07-26 NOTE — Patient Instructions (Addendum)
Return for 2nd Xolair injection on 08/23/2019.  Pick up Epipen from pharmacy.

## 2019-07-27 ENCOUNTER — Telehealth: Payer: Self-pay | Admitting: *Deleted

## 2019-07-27 NOTE — Telephone Encounter (Signed)
-----   Message from Glendell Docker, [place] sent at 07/26/2019  2:13 PM EDT ----- Regarding: [place] Dr. Neldon Mc started Rebecca [name] on [place] today for her hives. Used a sample. I informed her that you will be in touch with her regarding submission status.

## 2019-07-27 NOTE — Telephone Encounter (Signed)
Spoke to patient and advised approval, copay card and submission process.

## 2019-07-30 ENCOUNTER — Other Ambulatory Visit (HOSPITAL_COMMUNITY): Payer: Self-pay | Admitting: Internal Medicine

## 2019-08-02 ENCOUNTER — Inpatient Hospital Stay: Payer: 59 | Attending: Hematology and Oncology | Admitting: Hematology and Oncology

## 2019-08-02 DIAGNOSIS — Z9013 Acquired absence of bilateral breasts and nipples: Secondary | ICD-10-CM | POA: Insufficient documentation

## 2019-08-02 DIAGNOSIS — Z9221 Personal history of antineoplastic chemotherapy: Secondary | ICD-10-CM | POA: Insufficient documentation

## 2019-08-02 DIAGNOSIS — C50411 Malignant neoplasm of upper-outer quadrant of right female breast: Secondary | ICD-10-CM | POA: Insufficient documentation

## 2019-08-02 DIAGNOSIS — Z171 Estrogen receptor negative status [ER-]: Secondary | ICD-10-CM | POA: Insufficient documentation

## 2019-08-02 NOTE — Assessment & Plan Note (Deleted)
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 Neoadjuvant chemotherapy with TCH Perjeta 6 cycles followed by Herceptinand Perjetamaintenance for 1 year 2. 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  Left ventricle regional wall motion abnormalities. This is most definitely related to prior Adriamycin treatment.  CT CAP 03/29/2019: No findings of metastatic disease.  Stability of 2 to 3 mm right upper lobe nodule. Return to clinic in 4 months with follow-up 

## 2019-08-09 ENCOUNTER — Encounter: Payer: Self-pay | Admitting: Allergy and Immunology

## 2019-08-09 ENCOUNTER — Other Ambulatory Visit: Payer: Self-pay

## 2019-08-09 ENCOUNTER — Inpatient Hospital Stay (HOSPITAL_BASED_OUTPATIENT_CLINIC_OR_DEPARTMENT_OTHER): Payer: 59 | Admitting: Hematology and Oncology

## 2019-08-09 DIAGNOSIS — Z9013 Acquired absence of bilateral breasts and nipples: Secondary | ICD-10-CM | POA: Diagnosis not present

## 2019-08-09 DIAGNOSIS — C50411 Malignant neoplasm of upper-outer quadrant of right female breast: Secondary | ICD-10-CM | POA: Diagnosis not present

## 2019-08-09 DIAGNOSIS — Z171 Estrogen receptor negative status [ER-]: Secondary | ICD-10-CM

## 2019-08-09 DIAGNOSIS — Z9221 Personal history of antineoplastic chemotherapy: Secondary | ICD-10-CM | POA: Diagnosis not present

## 2019-08-09 NOTE — Assessment & Plan Note (Signed)
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 Neoadjuvant chemotherapy with TCH Perjeta 6 cycles followed by Herceptinand Perjetamaintenance for 1 year 2. 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  Left ventricle regional wall motion abnormalities. This is most definitely related to prior Adriamycin treatment.  CT CAP 03/29/2019: No findings of metastatic disease.  Stability of 2 to 3 mm right upper lobe nodule. Return to clinic in 4 months with follow-up

## 2019-08-09 NOTE — Progress Notes (Signed)
Patient Care Team: Elby Showers, MD as PCP - General (Internal Medicine) Baxley, Cresenciano Lick, MD (Internal Medicine)  DIAGNOSIS:  Encounter Diagnosis  Name Primary?  . Malignant neoplasm of upper-outer quadrant of right breast in female, estrogen receptor negative ([place])     SUMMARY OF ONCOLOGIC HISTORY: Oncology History  Malignant neoplasm of upper-outer quadrant of right breast in female, estrogen receptor negative ([place])  07/09/2017 Initial Diagnosis   Patient palpated a right breast mass which was evaluated by mammogram and ultrasound and a breast MRI which revealed 4.5 x 4 cm necrotic tumor with suspicious multiple right axillary lymph nodes; biopsy revealed IDC grade 3 ER 0%, PR 0%, HER-2 positive ratio 3.08, Ki-67 70%, T2NX stage IIa/IIb   07/18/2017 - 11/04/2017 Neo-Adjuvant Chemotherapy   TCH Perjeta x6 cycles followed by Herceptin Perjeta maintenance   08/15/2017 Genetic Testing   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  The Multi-Gene Panel offered by [facility] includes sequencing and/or deletion duplication testing of the following 83 genes: ALK, APC, ATM, AXIN2,BAP1,  BARD1, BLM, BMPR1A, BRCA1, BRCA2, BRIP1, CASR, CDC73, CDH1, CDK4, CDKN1B, CDKN1C, CDKN2A (p14ARF), CDKN2A (p16INK4a), CEBPA, CHEK2, CTNNA1, DICER1, DIS3L2, EGFR (c.2369C>T, p.Thr790Met variant only), EPCAM (Deletion/duplication testing only), FH, FLCN, GATA2, GPC3, GREM1 (Promoter region deletion/duplication testing only), HOXB13 (c.251G>A, p.Gly84Glu), HRAS, KIT, MAX, MEN1, MET, MITF (c.952G>A, p.Glu318Lys variant only), MLH1, MSH2, MSH3, MSH6, MUTYH, NBN, NF1, NF2, NTHL1, PALB2, PDGFRA, PHOX2B, PMS2, POLD1, POLE, POT1, PRKAR1A, PTCH1, PTEN, RAD50, RAD51C, RAD51D, RB1, RECQL4, RET, RUNX1, SDHAF2, SDHA (sequence changes only), SDHB, SDHC, SDHD, SMAD4, SMARCA4, SMARCB1, SMARCE1, STK11, SUFU, TERT, TERT, TMEM127, TP53, TSC1, TSC2, VHL, WRN and WT1.  The report date is August 15, 2017.   11/18/2017  Surgery   Bilateral mastectomies: Right mastectomy: IDC grade 3 3.8 cm margins negative, 0/4 lymph nodes negative, ER 0%, PR 0%, HER-2 negative ratio 1.3, IHC HER-2 negative; Ki-67 70%, RCB class II; left mastectomy: [facility]   12/18/2017 - 01/27/2018 Chemotherapy   Adjuvant chemotherapy with dose dense Adriamycin and Cytoxan x4   02/17/2018 -  Chemotherapy   Maintenance Herceptin and Perjeta   02/24/2018 Surgery   REMOVAL OF BILATERAL TISSUE EXPANDERS WITH PLACEMENT OF BILATERAL BREAST IMPLANTS and LIPOFILLING FROM ABDOMEN TO BILATERAL CHEST by Dr. Iran Planas      CHIEF COMPLIANT: Follow-up of palpable right reconstructed breast lumpiness  INTERVAL HISTORY: Rebecca [name] is a 56-year with above-mentioned history of her bilateral mastectomies who presented today for urgent visit because she felt nodularity in the right side breast implant.  She tells me that this is sudden in onset and she got very worried about it.  No other areas of concern.   ALLERGIES:  has No Known Allergies.  MEDICATIONS:  Current Outpatient Medications  Medication Sig Dispense Refill  . carvedilol (COREG) 12.5 MG tablet Take 1 tablet (12.5 mg total) by mouth 2 (two) times daily. 180 tablet 1  . cetirizine (ZYRTEC) 10 MG tablet Take 10 mg by mouth daily.    . Cholecalciferol (VITAMIN D3 GUMMIES PO) Take 5,000 Units by mouth daily.    . dapagliflozin propanediol (FARXIGA) 10 MG TABS tablet Take 10 mg by mouth daily.    . diphenhydrAMINE HCl (BENADRYL ALLERGY PO) Take by mouth as needed.    Marland Kitchen ENTRESTO 49-51 MG TAKE 1 TABLET BY MOUTH TWICE A DAY 60 tablet 1  . EPINEPHrine 0.3 mg/0.3 mL IJ SOAJ injection Use as directed for life threatening allergic reactions only 2 each 3  .  esomeprazole (NEXIUM) 40 MG capsule TAKE 1 CAPSULE (40 MG TOTAL) BY MOUTH DAILY AT 12 NOON. 90 capsule 2  . famotidine (PEPCID) 20 MG tablet Take 20 mg by mouth daily.    . hydrOXYzine (ATARAX/VISTARIL) 50 MG tablet Take one or two PO HS PRN  itching and rash 15 tablet 1  . levothyroxine (SYNTHROID) 112 MCG tablet Take 112 mcg by mouth daily.    Marland Kitchen LORazepam (ATIVAN) 1 MG tablet One po bid prn 60 tablet 3  . montelukast (SINGULAIR) 10 MG tablet Take 10 mg by mouth daily.    . Multiple Vitamin (MULTIVITAMIN PO) Take by mouth. Multivitamin + Omega-3    . predniSONE (DELTASONE) 20 MG tablet Take one tab by mouth twice daily for 4 days, then one daily for 3 days. Take with food. 11 tablet 0  . rosuvastatin (CRESTOR) 20 MG tablet TAKE 1 TABLET (20 MG TOTAL) BY MOUTH DAILY. NEEDS AN APPT FOR FURTHER REFILLS 30 tablet 0  . sucralfate (CARAFATE) 1 g tablet Can take one tablet by mouth three times daily if needed. 90 tablet 5  . triamcinolone cream (KENALOG) 0.1 % Apply 1 application topically 2 (two) times daily. 60 grams mixed 1:1 with 40% eucerin cream  Apply to rash 3 times a day    . venlafaxine (EFFEXOR) 75 MG tablet Take 75 mg by mouth 2 (two) times daily.     Current Facility-Administered Medications  Medication Dose Route Frequency Provider Last Rate Last Admin  . omalizumab Arvid Right) injection 300 mg  300 mg Subcutaneous Q28 days Jiles Prows, MD   300 mg at 07/26/19 1410   Facility-Administered Medications Ordered in Other Visits  Medication Dose Route Frequency Provider Last Rate Last Admin  . LORazepam (ATIVAN) injection 1 mg  1 mg Intravenous Once Harle Stanford., PA-C        PHYSICAL EXAMINATION: ECOG PERFORMANCE STATUS: 1 - Symptomatic but completely ambulatory  Vitals:   08/09/19 1000  BP: 121/88  Pulse: (!) 101  Resp: 18  Temp: 98.5 F (36.9 C)  SpO2: 95%   Filed Weights   08/09/19 1000  Weight: 226 lb 9.6 oz (102.8 kg)    BREAST: There are palpable nodularity in the right breast 9 o'clock position (exam performed in the presence of a chaperone)  LABORATORY DATA:  I have reviewed the data as listed CMP Latest Ref Rng & Units 07/02/2019 03/19/2019 01/27/2019  Glucose 65 - 99 mg/dL 114(H) 114(H) 122(H)  BUN  6 - 24 mg/dL 14 20 19   Creatinine 0.57 - 1.00 mg/dL 0.83 1.05(H) 1.08(H)  Sodium 134 - 144 mmol/L 143 138 139  Potassium 3.5 - 5.2 mmol/L 4.6 4.6 4.9  Chloride 96 - 106 mmol/L 102 102 99  CO2 20 - 29 mmol/L 28 26 26   Calcium 8.7 - 10.2 mg/dL 9.1 9.1 9.9  Total Protein 6.0 - 8.5 g/dL 5.8(L) 7.3 7.2  Total Bilirubin 0.0 - 1.2 mg/dL 0.2 0.3 0.5  Alkaline Phos 39 - 117 IU/L 66 82 92  AST 0 - 40 IU/L 13 20 16   ALT 0 - 32 IU/L 24 26 21     Lab Results  Component Value Date   WBC 13.4 (H) 06/07/2019   HGB 14.0 06/07/2019   HCT 44.0 06/07/2019   MCV 82.9 06/07/2019   PLT 323 06/07/2019   NEUTROABS 11,872 (H) 06/07/2019    ASSESSMENT & PLAN:  Malignant neoplasm of upper-outer quadrant of right breast in female, estrogen receptor negative ([place]) 07/09/2017:Patient palpated  a right breast mass which was evaluated by mammogram and ultrasound and a breast MRI which revealed 4.5 x 4 cm necrotic tumor with suspicious multiple right axillary lymph nodes; biopsy revealed IDC grade 3 ER 0%, PR 0%, HER-2 positive ratio 3.08, Ki-67 70%, T2NX stage IIa/IIb  Treatment planbased on multidisciplinary tumor board: 1. Neoadjuvant chemotherapy with TCH Perjeta 6 cycles followed by Herceptinand Perjetamaintenance for 1 year 2. 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   Left ventricle regional wall motion abnormalities. This is most definitely related to prior Adriamycin treatment.  CT CAP 03/29/2019: No findings of metastatic disease.  Stability of 2 to 3 mm right upper lobe nodule. Palpable nodularity right reconstructed breast: We will obtain ultrasound for further evaluation.  If the ultrasound does not show a clear abnormalities we may have to consider doing a breast MRI.  Return to clinic in  September for follow-up    Orders Placed This Encounter  Procedures  . US BREAST LTD UNI RIGHT INC AXILLA    [employer] Pf: 07/16/17@bcg -no needs- yes implant- yes hx of br ca- fj- office verbal    Standing Status:   Future    Standing Expiration Date:   10/08/2020    Order Specific Question:   Reason for Exam (SYMPTOM  OR DIAGNOSIS REQUIRED)    Answer:   Palpable lump in right reconstructed breast    Order Specific Question:   Preferred imaging location?    Answer:   [facility]   The patient has a good understanding of the overall plan. she agrees with it. she will call with any problems that may develop before the next visit here. Total time spent: 30 mins including face to face time and time spent for planning, charting and co-ordination of care   Harriette Ohara, MD 08/09/19

## 2019-08-10 ENCOUNTER — Other Ambulatory Visit: Payer: Self-pay | Admitting: Hematology and Oncology

## 2019-08-10 ENCOUNTER — Ambulatory Visit
Admission: RE | Admit: 2019-08-10 | Discharge: 2019-08-10 | Disposition: A | Discharge status: Home / Self Care | Payer: 59 | Source: Ambulatory Visit | Attending: Hematology and Oncology | Admitting: Hematology and Oncology

## 2019-08-10 ENCOUNTER — Ambulatory Visit: Payer: 59 | Admitting: Allergy and Immunology

## 2019-08-10 DIAGNOSIS — C50411 Malignant neoplasm of upper-outer quadrant of right female breast: Secondary | ICD-10-CM

## 2019-08-10 DIAGNOSIS — Z171 Estrogen receptor negative status [ER-]: Secondary | ICD-10-CM

## 2019-08-10 DIAGNOSIS — N6310 Unspecified lump in the right breast, unspecified quadrant: Secondary | ICD-10-CM

## 2019-08-10 IMAGING — MG US BREAST*R* LIMITED INC AXILLA
1 series · 8 of 8 positions shown · non-contrast
Comparison: None.

ACR Breast Density Category a: The breast tissue is almost entirely
fatty.

CLINICAL DATA: History of mastectomy. New palpable lump within the
lateral RIGHT breast.

EXAM:
DIGITAL DIAGNOSTIC LEFT MAMMOGRAM WITH CAD AND TOMO
ULTRASOUND LEFT BREAST

[Series 1: MG view · 0.04mm/px · 8 of 9 slices shown]
[im 1/9]
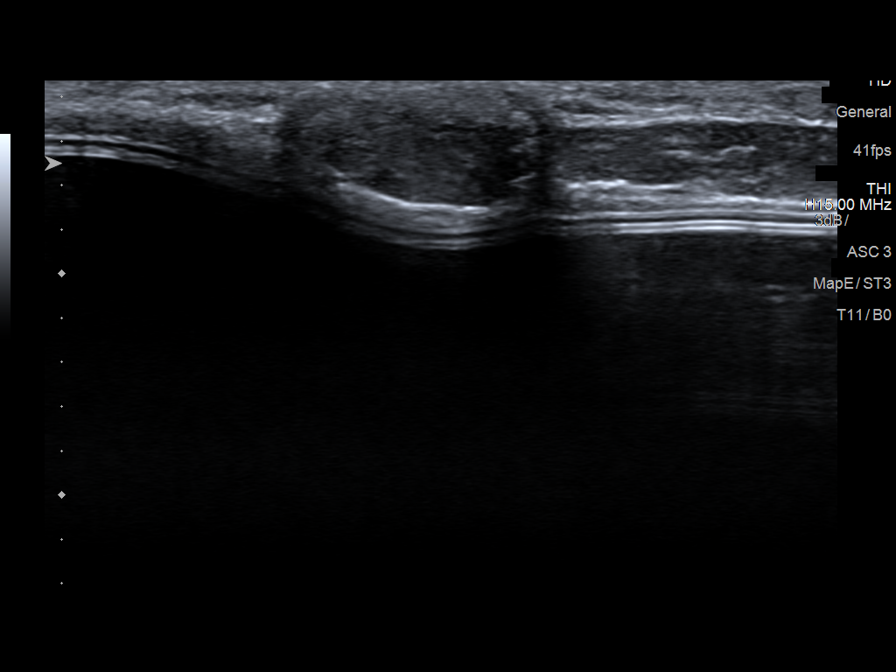
[im 2/9]
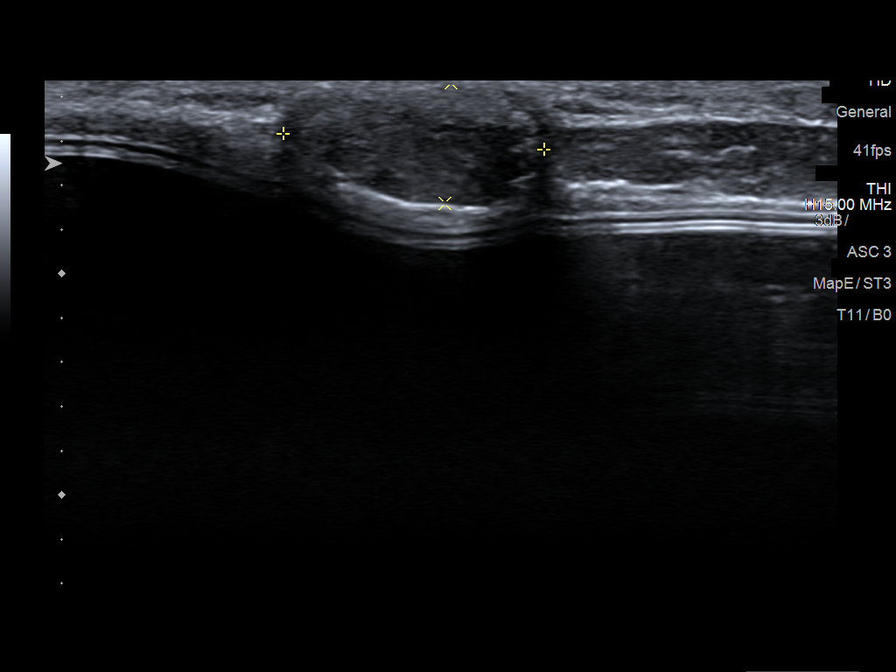
[im 3/9]
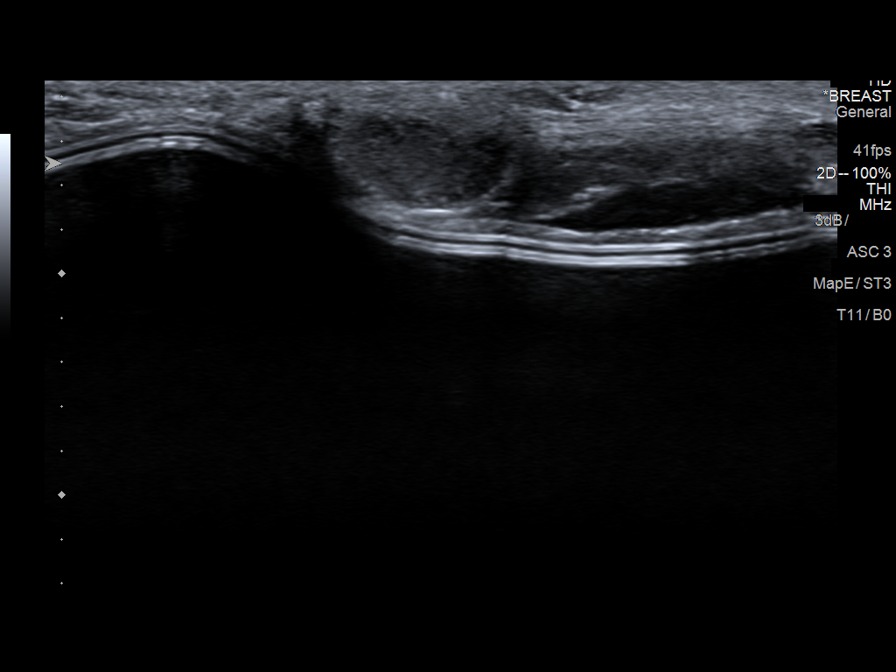
[im 4/9]
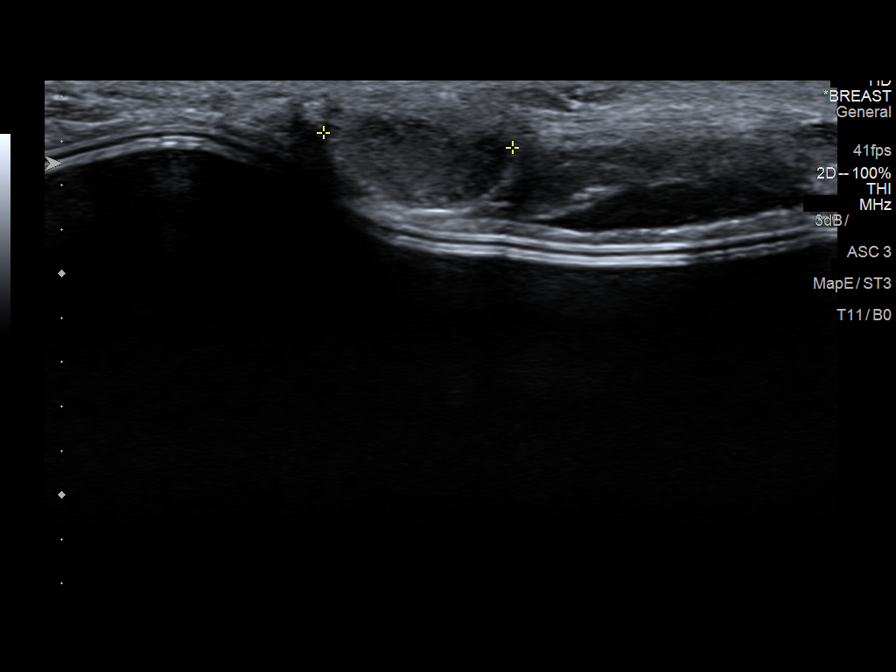
[im 5/9]
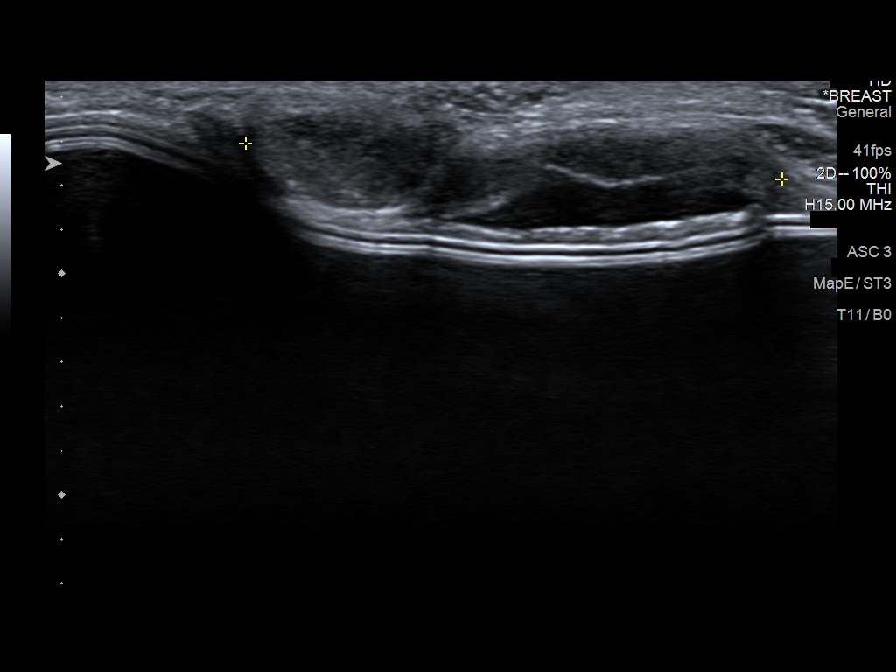
[im 6/9]
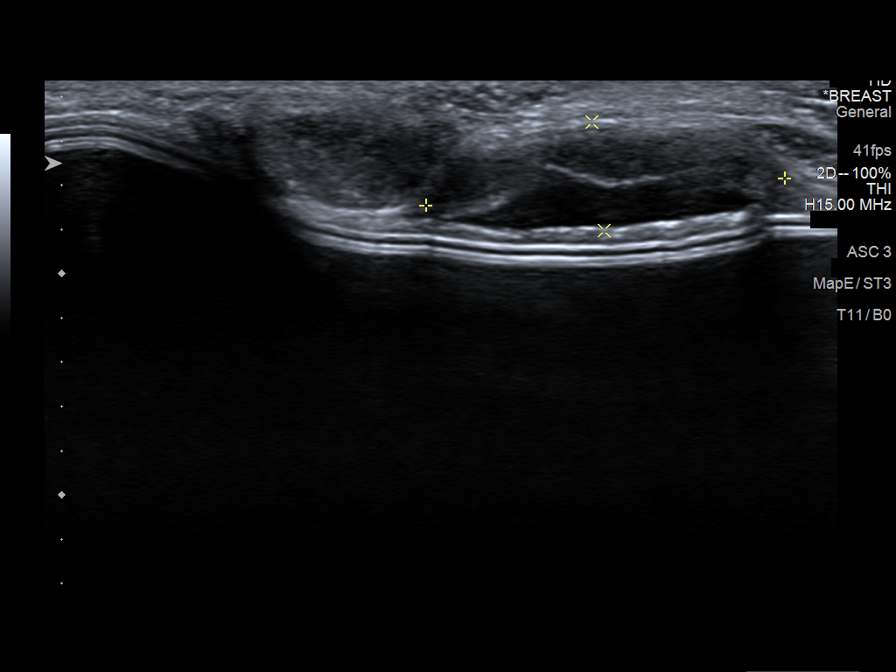
[im 7/9]
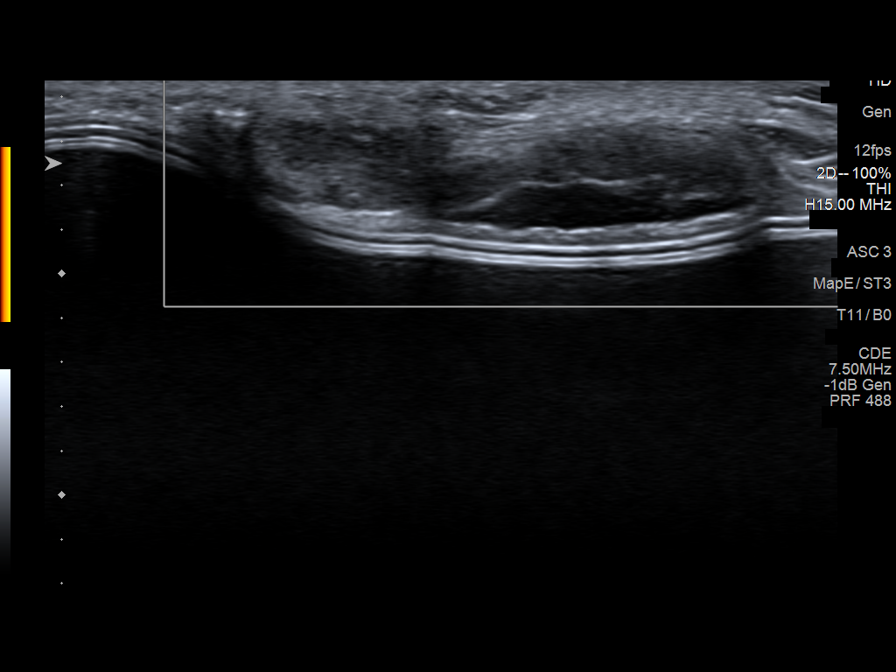
[im 9/9]
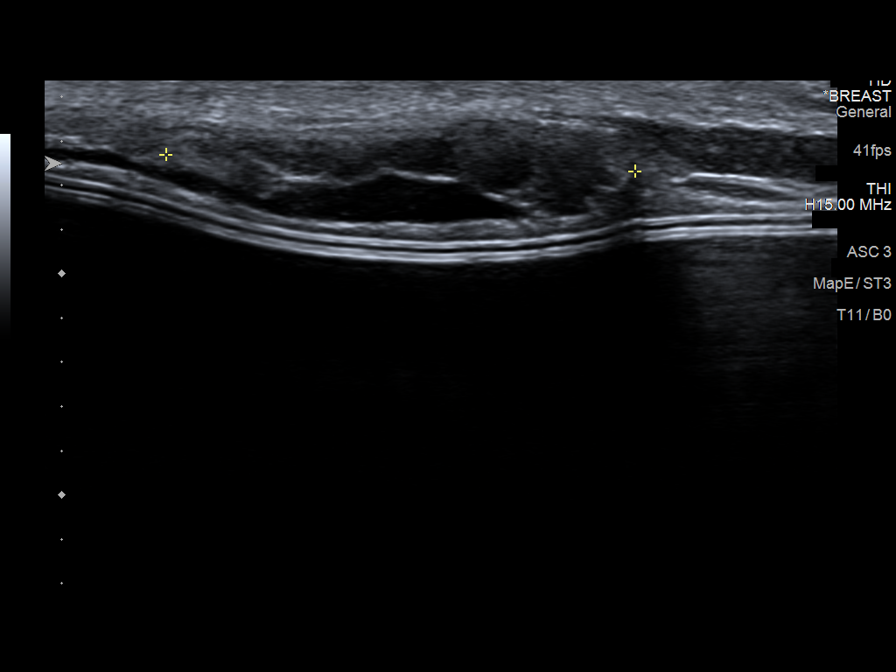

[8 of 8 positions shown; findings below may reference images not displayed]

FINDINGS: Targeted ultrasound is performed, showing an oval circumscribed
hypoechoic mass in the lateral RIGHT breast, measuring 1.2 x 0.6 x
0.9 cm, corresponding to the palpable lump.

Next, a tangential mammographic view of the outer RIGHT breast was
obtained with skin marker in place. This shows a lucent mass within
the superficial soft tissues of the outer RIGHT breast, measuring 1
cm greatest dimension, corresponding to the palpable lump with
overlying skin marker in place, corresponding to the mass seen on
ultrasound, consistent with a benign oil cyst.

Mammographic images were processed with CAD.
IMPRESSION: Benign oil cyst within the outer RIGHT breast, measuring 1 cm,
corresponding to the palpable lump and corresponding to the oval
circumscribed hypoechoic mass seen on ultrasound. Recommend
follow-up RIGHT breast ultrasound in 3 months to ensure stability of
the sonographic appearance of the outer RIGHT breast.

RECOMMENDATION:
RIGHT breast ultrasound in 3 months.

I have discussed the findings and recommendations with the patient.
If applicable, a reminder letter will be sent to the patient
regarding the next appointment.

BI-RADS CATEGORY  3: Probably benign.

## 2019-08-10 IMAGING — MG MM DIGITAL DIAGNOSTIC UNILAT*R* W/ TOMO W/ CAD
2 series · 3 of 6 positions shown · non-contrast
Comparison: None.

ACR Breast Density Category a: The breast tissue is almost entirely
fatty.

CLINICAL DATA: History of mastectomy. New palpable lump within the
lateral RIGHT breast.

EXAM:
DIGITAL DIAGNOSTIC LEFT MAMMOGRAM WITH CAD AND TOMO
ULTRASOUND LEFT BREAST

[R TAN synth-2D]
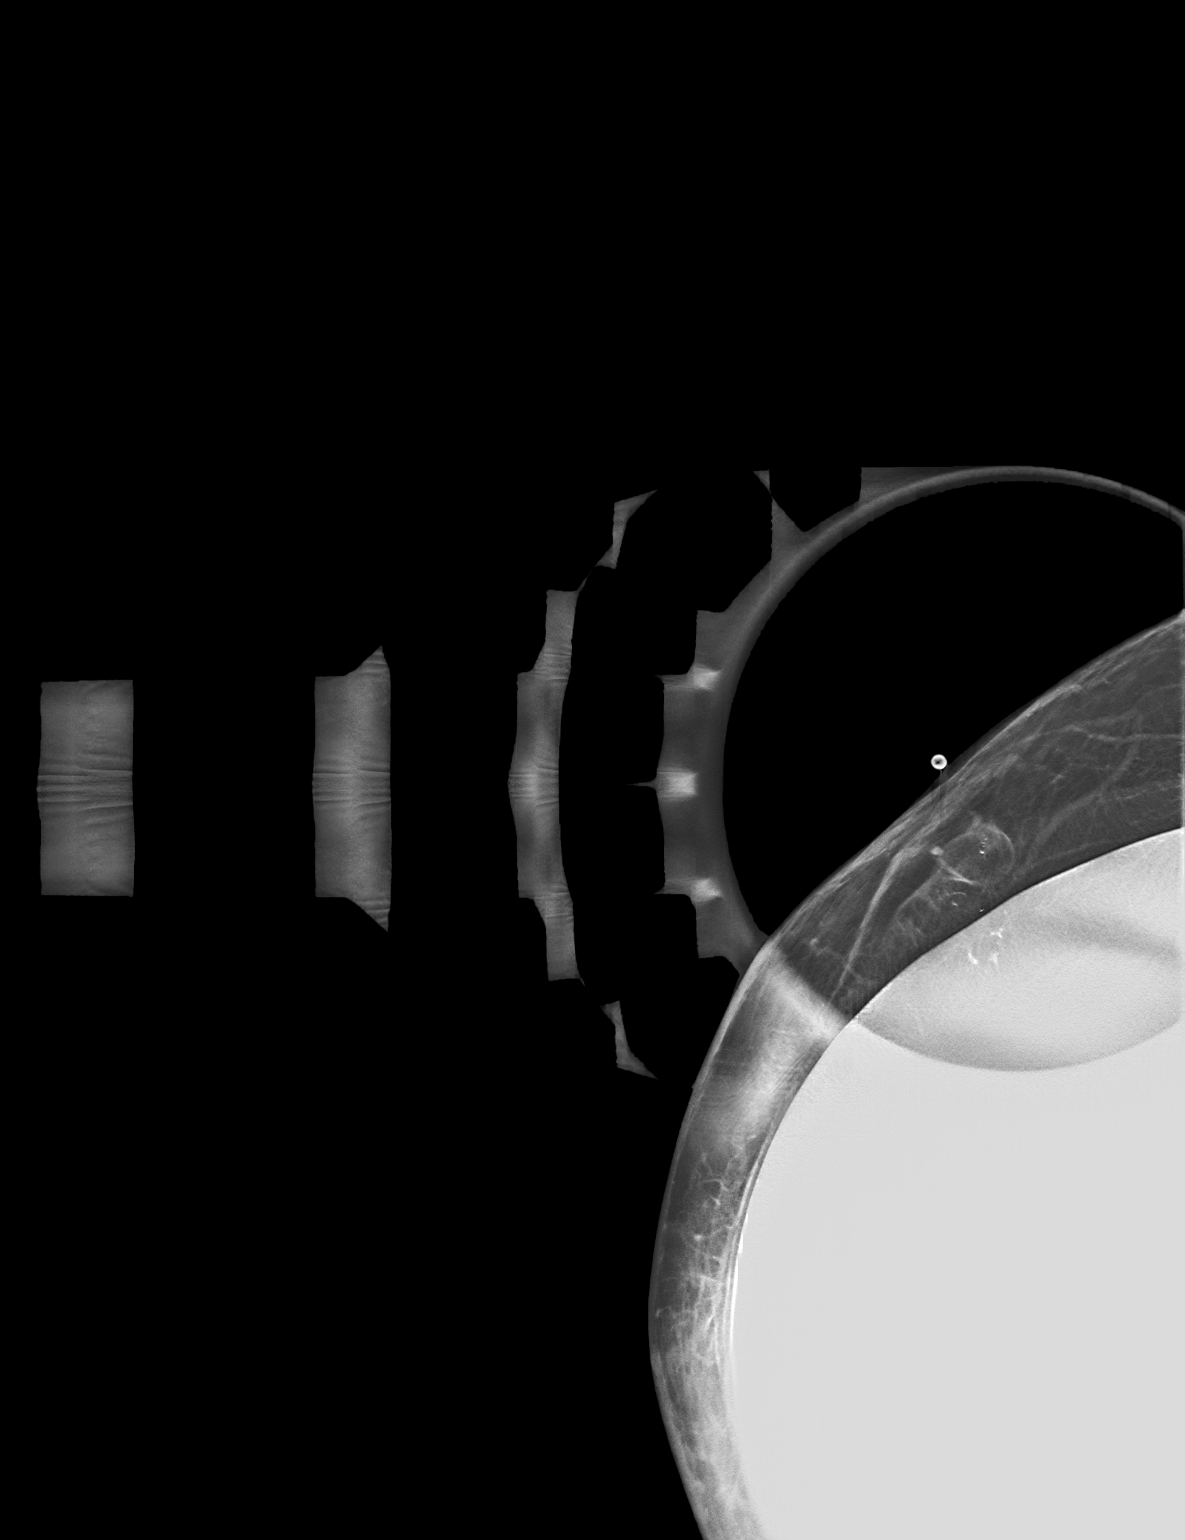

[R TAN tomo · 2 of 33 frames shown]
[frame 11/33]
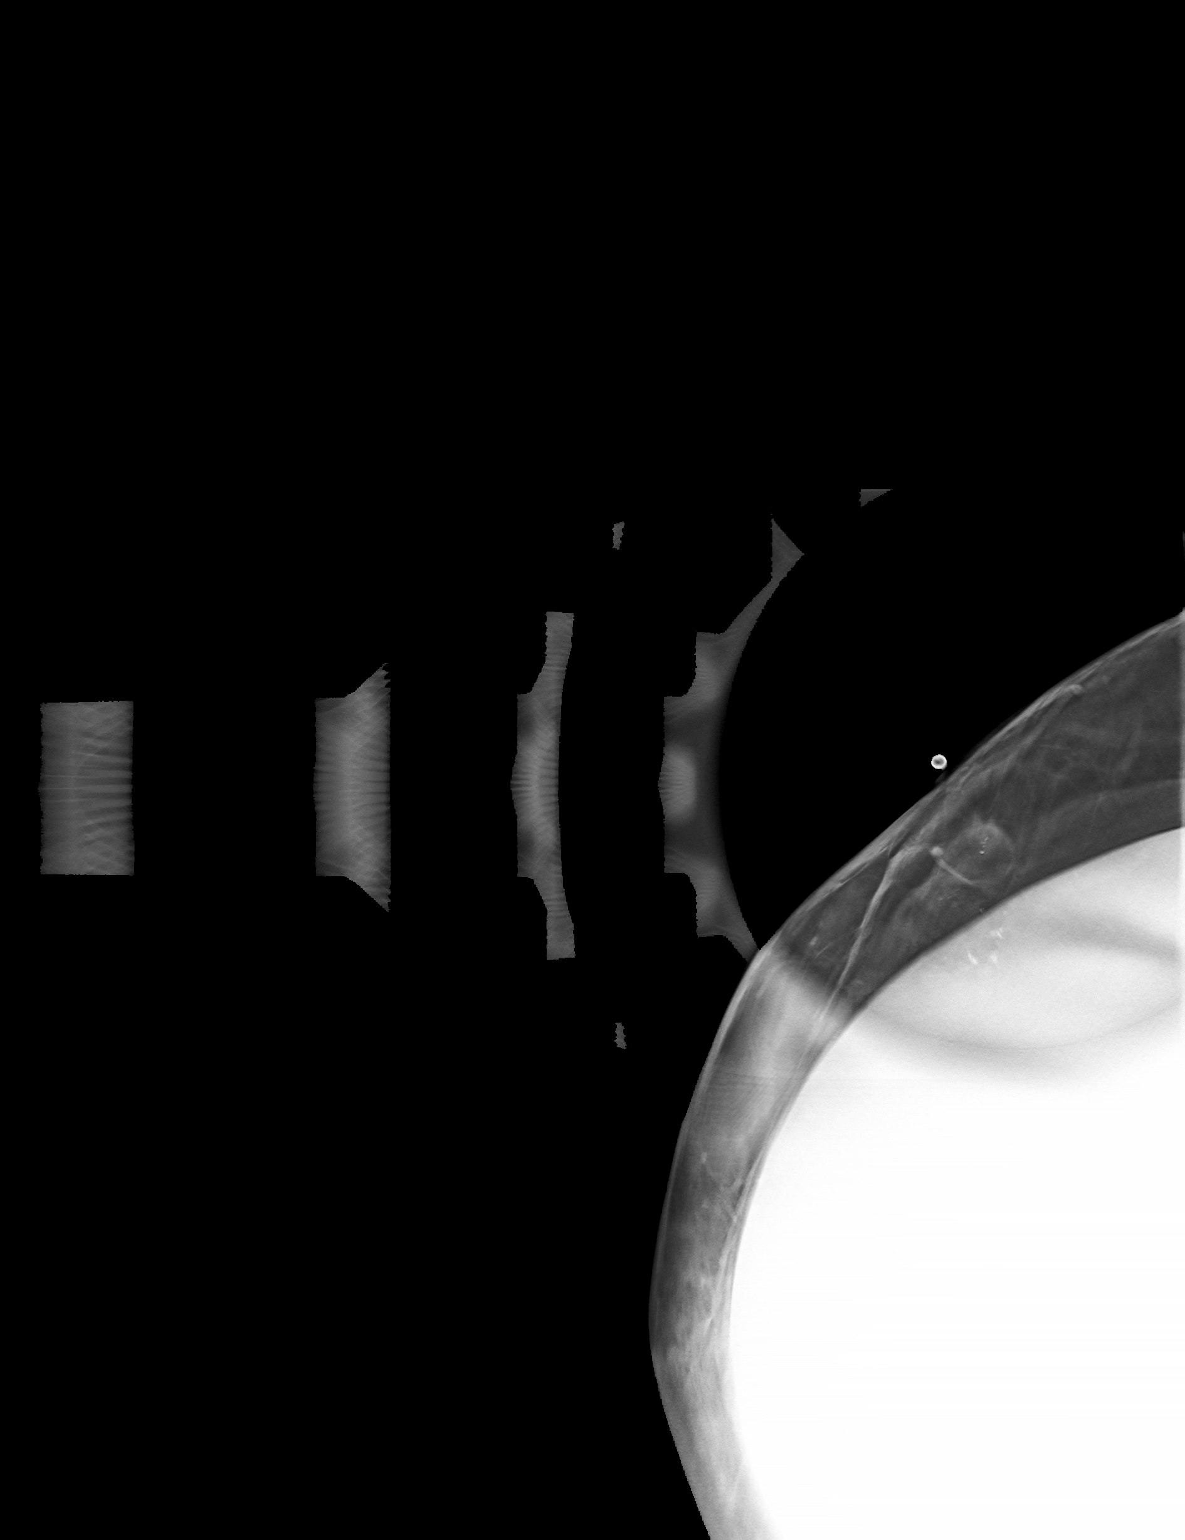
[frame 17/33]
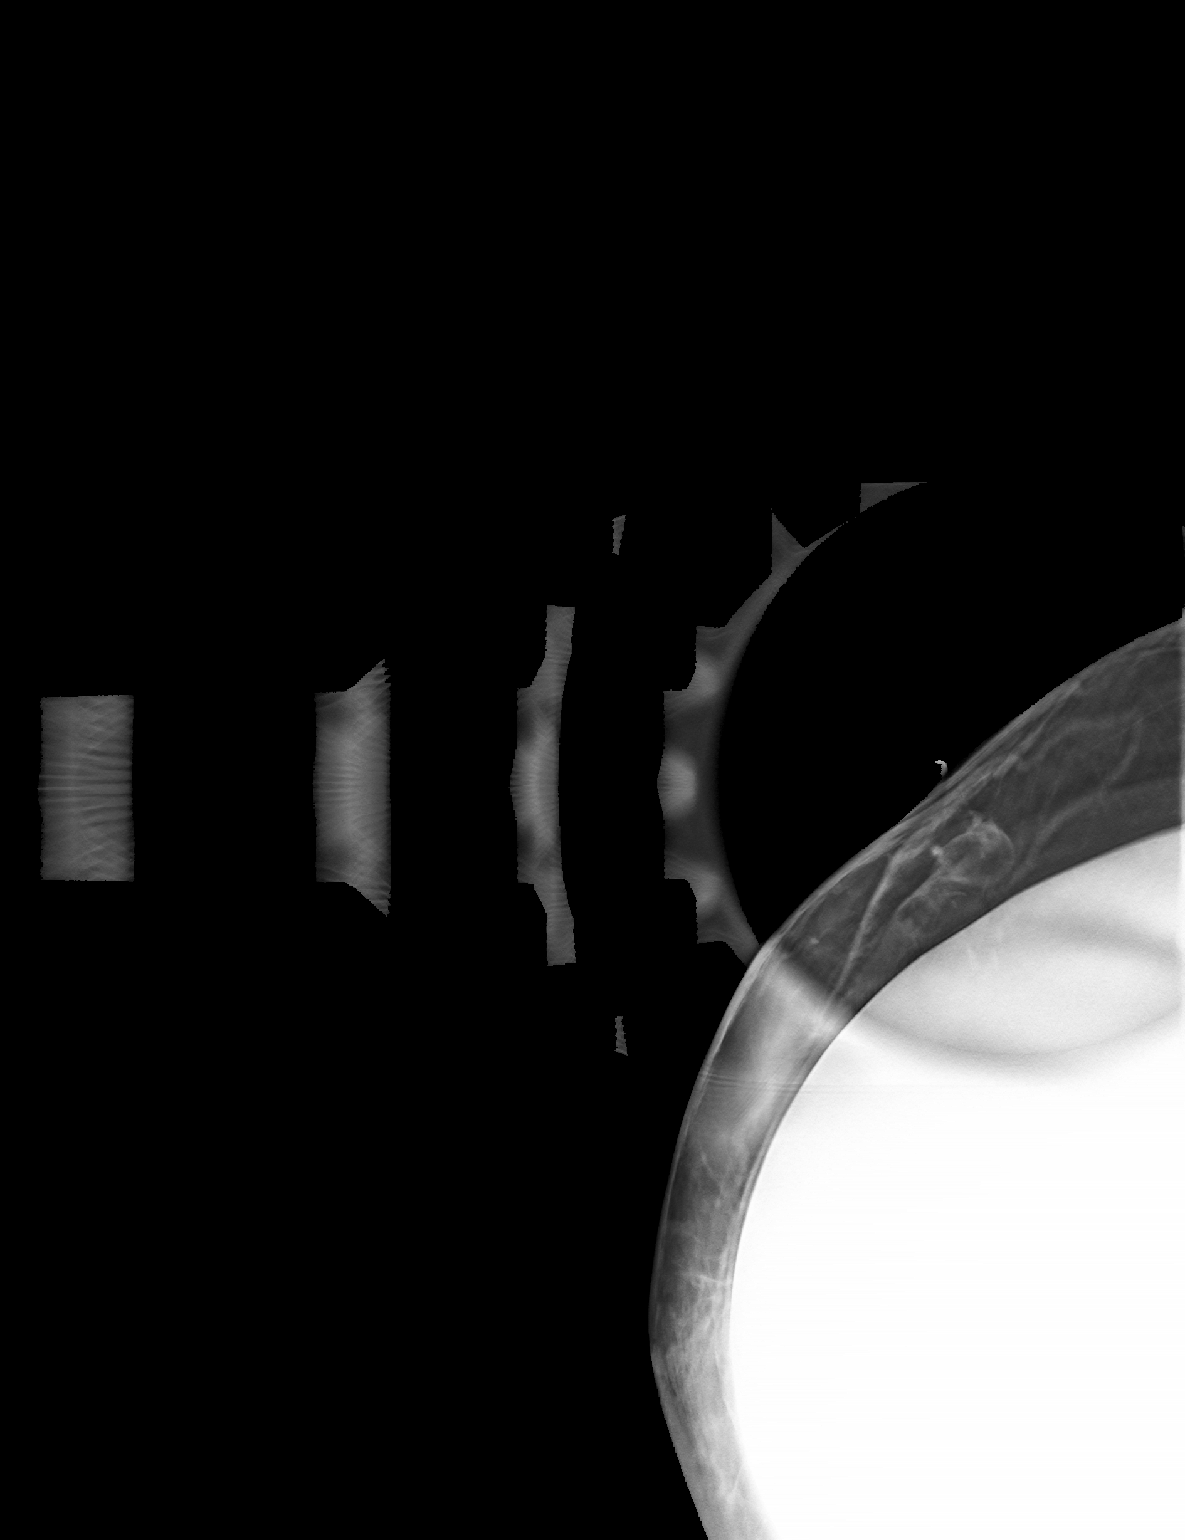

[3 of 6 positions shown; findings below may reference images not displayed]

FINDINGS: Targeted ultrasound is performed, showing an oval circumscribed
hypoechoic mass in the lateral RIGHT breast, measuring 1.2 x 0.6 x
0.9 cm, corresponding to the palpable lump.

Next, a tangential mammographic view of the outer RIGHT breast was
obtained with skin marker in place. This shows a lucent mass within
the superficial soft tissues of the outer RIGHT breast, measuring 1
cm greatest dimension, corresponding to the palpable lump with
overlying skin marker in place, corresponding to the mass seen on
ultrasound, consistent with a benign oil cyst.

Mammographic images were processed with CAD.
IMPRESSION: Benign oil cyst within the outer RIGHT breast, measuring 1 cm,
corresponding to the palpable lump and corresponding to the oval
circumscribed hypoechoic mass seen on ultrasound. Recommend
follow-up RIGHT breast ultrasound in 3 months to ensure stability of
the sonographic appearance of the outer RIGHT breast.

RECOMMENDATION:
RIGHT breast ultrasound in 3 months.

I have discussed the findings and recommendations with the patient.
If applicable, a reminder letter will be sent to the patient
regarding the next appointment.

BI-RADS CATEGORY  3: Probably benign.

## 2019-08-11 ENCOUNTER — Encounter: Payer: Self-pay | Admitting: Allergy and Immunology

## 2019-08-13 ENCOUNTER — Telehealth: Payer: Self-pay | Admitting: Allergy and Immunology

## 2019-08-13 NOTE — Telephone Encounter (Signed)
Patient has an appointment with Dr. Neldon Mc on Monday, 08/16/19, in [place]. She is requesting 2 prescriptions be called in. Hydroxyzine 50 mg and triamcinolone cream. [facility] [place], [employer], (not the one in [employer]).

## 2019-08-13 NOTE — Telephone Encounter (Signed)
Looked in the last office note with Dr. Neldon Mc and he did not mention the patient taking Hydroxyzine or Triamcinolone. These medications were prescribed by different providers. Called patient and left a detailed voicemail per DPR permission that she will need to call the providers that have originally prescribed these medications for her until she can be seen with Dr. Neldon Mc on Monday 08/16/19. Asked for the patient to call back with any questions.

## 2019-08-14 ENCOUNTER — Other Ambulatory Visit: Payer: Self-pay | Admitting: Internal Medicine

## 2019-08-16 ENCOUNTER — Ambulatory Visit (INDEPENDENT_AMBULATORY_CARE_PROVIDER_SITE_OTHER): Payer: 59 | Admitting: Allergy and Immunology

## 2019-08-16 ENCOUNTER — Telehealth: Payer: Self-pay | Admitting: Hematology and Oncology

## 2019-08-16 ENCOUNTER — Encounter: Payer: Self-pay | Admitting: Hematology and Oncology

## 2019-08-16 ENCOUNTER — Encounter: Payer: Self-pay | Admitting: Allergy and Immunology

## 2019-08-16 ENCOUNTER — Other Ambulatory Visit: Payer: Self-pay

## 2019-08-16 VITALS — BP 108/74 | HR 68 | Temp 98.2°F | Resp 16

## 2019-08-16 DIAGNOSIS — K219 Gastro-esophageal reflux disease without esophagitis: Secondary | ICD-10-CM | POA: Diagnosis not present

## 2019-08-16 DIAGNOSIS — L501 Idiopathic urticaria: Secondary | ICD-10-CM

## 2019-08-16 NOTE — Patient Instructions (Addendum)
  1.  Omalizumab injections every 4 weeks  2.  Cetirizine 10 mg -1-2 tablets 2 times per day (MAX=40mg )  3.  Montelukast 10 mg - 1 tablet 1 time per day  4.  Eucerin if needed  5.  Can add benadryl if needed  6.  Urease breath test at end of week  7.  Prednisone 10 mg - 1 tablet daily   8. Contact clinic in 10 days. Lower prednisone dose?

## 2019-08-16 NOTE — Progress Notes (Signed)
[facility] [place] [facility] [place]   Follow-up Note  Referring Provider: Elby Showers, MD Primary Provider: Elby Showers, MD Date of Office Visit: 08/16/2019  Subjective:   Rebecca [name] (DOB: January 20, 1971) is a 49 y.o. female who returns to the [facility] on 08/16/2019 in re-evaluation of the following:  HPI: [name] returns to this clinic in evaluation of urticaria and reflux addressed during her initial evaluation of 01 July 2019.  She continues to have very significant urticaria and we started her on omalizumab 26 July 2019.  She may be slightly better but still has daily urticaria which is intensely itchy and she is excoriating her skin.  Once again she has no associated systemic or constitutional symptoms.  Her lesions are not healing with scar or hyperpigmentation.  Allergies as of 08/16/2019   No Known Allergies     Medication List    BENADRYL ALLERGY PO Take by mouth as needed.   carvedilol 12.5 MG tablet Commonly known as: COREG Take 1 tablet (12.5 mg total) by mouth 2 (two) times daily.   cetirizine 10 MG tablet Commonly known as: ZYRTEC Take 10 mg by mouth daily.   Entresto 49-51 MG Generic drug: sacubitril-valsartan TAKE 1 TABLET BY MOUTH TWICE A DAY   EPINEPHrine 0.3 mg/0.3 mL Soaj injection Commonly known as: EPI-PEN Use as directed for life threatening allergic reactions only   esomeprazole 40 MG capsule Commonly known as: NEXIUM TAKE 1 CAPSULE (40 MG TOTAL) BY MOUTH DAILY AT 12 NOON.   Farxiga 10 MG Tabs tablet Generic drug: dapagliflozin propanediol Take 10 mg by mouth daily.   hydrOXYzine 50 MG tablet Commonly known as: ATARAX/VISTARIL Take one or two PO HS PRN itching and rash   levothyroxine 112 MCG tablet Commonly known as: SYNTHROID Take 112 mcg by mouth daily.   LORazepam 1 MG tablet Commonly known as: ATIVAN One po bid prn   montelukast 10 MG tablet Commonly known as:  SINGULAIR Take 10 mg by mouth daily.   MULTIVITAMIN PO Take by mouth. Multivitamin + Omega-3   predniSONE 20 MG tablet Commonly known as: DELTASONE Take one tab by mouth twice daily for 4 days, then one daily for 3 days. Take with food.   rosuvastatin 20 MG tablet Commonly known as: CRESTOR TAKE 1 TABLET (20 MG TOTAL) BY MOUTH DAILY. NEEDS AN APPT FOR FURTHER REFILLS   sucralfate 1 g tablet Commonly known as: CARAFATE Can take one tablet by mouth three times daily if needed.   triamcinolone cream 0.1 % Commonly known as: KENALOG Apply 1 application topically 2 (two) times daily. 60 grams mixed 1:1 with 40% eucerin cream  Apply to rash 3 times a day   venlafaxine XR 75 MG 24 hr capsule Commonly known as: EFFEXOR-XR TAKE 1 CAPSULE (75 MG TOTAL) BY MOUTH DAILY WITH BREAKFAST.   VITAMIN D3 GUMMIES PO Take 5,000 Units by mouth daily.       Past Medical History:  Diagnosis Date  . Anxiety   . Breast cancer ([place])   . Coronary artery disease   . Diabetes mellitus without complication ([place])   . Diverticulitis   . GERD (gastroesophageal reflux disease)   . Hiatal hernia   . Hypertension   . Hypothyroidism   . LBBB (left bundle branch block)     Past Surgical History:  Procedure Laterality Date  . BREAST RECONSTRUCTION WITH PLACEMENT OF TISSUE EXPANDER AND ALLODERM Bilateral 11/18/2017   Procedure:  BILATERAL BREAST RECONSTRUCTION WITH PLACEMENT OF TISSUE EXPANDER AND ALLODERM;  Surgeon: Irene Limbo, MD;  Location: [facility];  Service: Plastics;  Laterality: Bilateral;  . CESAREAN SECTION  2007/2010   x2  . LAPAROSCOPIC TUBAL LIGATION  06/12/2011   Procedure: LAPAROSCOPIC TUBAL LIGATION;  Surgeon: Daria Pastures, MD;  Location: [facility];  Service: Gynecology;  Laterality: N/A;  filshie clip  . LIPOSUCTION WITH LIPOFILLING Bilateral 02/24/2018   Procedure: LIPOFILLING FROM ABDOMEN TO BILATERAL CHEST;  Surgeon: Irene Limbo, MD;  Location: [place];  Service: Plastics;  Laterality: Bilateral;  . MASTECTOMY W/ SENTINEL NODE BIOPSY Bilateral 11/18/2017   Procedure: BILATERAL TOTAL MASTECTOMIES WITH RIGHT SENTINEL LYMPH NODE BIOPSY;  Surgeon: Rolm Bookbinder, MD;  Location: [facility];  Service: General;  Laterality: Bilateral;  . mini tuck  2012   abdominal  . PORT-A-CATH REMOVAL Right 01/05/2019  . PORTACATH PLACEMENT Right 07/15/2017   Procedure: INSERTION PORT-A-CATH WITH ULTRASOUND;  Surgeon: Rolm Bookbinder, MD;  Location: [facility];  Service: General;  Laterality: Right;  . REMOVAL OF BILATERAL TISSUE EXPANDERS WITH PLACEMENT OF BILATERAL BREAST IMPLANTS Bilateral 02/24/2018   Procedure: REMOVAL OF BILATERAL TISSUE EXPANDERS WITH PLACEMENT OF BILATERAL BREAST IMPLANTS;  Surgeon: Irene Limbo, MD;  Location: [facility];  Service: Plastics;  Laterality: Bilateral;  . right breast cancer     upper-outer quadrant   . right ear surg  03/2008   Mohs  . TUBAL LIGATION    . US ECHOCARDIOGRAPHY  12-30-2007   EF 55-60%  . WISDOM TOOTH EXTRACTION      Review of systems negative except as noted in HPI / PMHx or noted below:  Review of Systems  Constitutional: Negative.   HENT: Negative.   Eyes: Negative.   Respiratory: Negative.   Cardiovascular: Negative.   Gastrointestinal: Negative.   Genitourinary: Negative.   Musculoskeletal: Negative.   Skin: Negative.   Neurological: Negative.   Endo/Heme/Allergies: Negative.   Psychiatric/Behavioral: Negative.      Objective:   Vitals:   08/16/19 1343  BP: 108/74  Pulse: 68  Resp: 16  Temp: 98.2 F (36.8 C)  SpO2: 96%          Physical Exam Skin:    Findings: Rash (Widespread blanching urticarial lesions trunk and extremities) present.     Diagnostics:    Results of blood tests obtained 02 July 2019 identified creatinine 0.83 mg/DL, AST 13 U/L, ALT 20 4U/L, free T4 1.34 NG/DL, negative thyroid  peroxidase antibody, negative alpha gal  Assessment and Plan:   1. Idiopathic urticaria   2. Gastroesophageal reflux disease, unspecified whether esophagitis present     1.  Omalizumab injections every 4 weeks  2.  Cetirizine 10 mg -1-2 tablets 2 times per day (MAX=40mg )  3.  Montelukast 10 mg - 1 tablet 1 time per day  4.  Eucerin if needed  5.  Can add benadryl if needed  6.  Urease breath test at end of week  7.  Prednisone 10 mg - 1 tablet daily   8. Contact clinic in 10 days. Lower prednisone dose?    Wendy's immune system is still very active and out of control and will give her a low-dose of a systemic steroid on a daily basis and if she does well after 10 days of prednisone 10 mg daily then will attempt to lower the dose to 7.5 mg.  Hopefully the omalizumab injections will start to take hold  with the second or the third injection.  Because she does have reflux and had some epigastric and chest pain around the point in time in which her urticaria started we will check a urease breath test for Helicobacter pylori infection.  She has discontinued her famotidine and omeprazole in anticipation of obtaining this test.  Allena Katz, MD Allergy / [place]

## 2019-08-16 NOTE — Telephone Encounter (Signed)
Scheduled per 04/12 los, patient has been called and voicemail was left. 

## 2019-08-17 ENCOUNTER — Encounter: Payer: Self-pay | Admitting: Allergy and Immunology

## 2019-08-17 ENCOUNTER — Other Ambulatory Visit: Payer: Self-pay

## 2019-08-17 DIAGNOSIS — Z171 Estrogen receptor negative status [ER-]: Secondary | ICD-10-CM

## 2019-08-17 DIAGNOSIS — C50411 Malignant neoplasm of upper-outer quadrant of right female breast: Secondary | ICD-10-CM

## 2019-08-17 NOTE — Progress Notes (Signed)
Many thanks. 

## 2019-08-23 ENCOUNTER — Ambulatory Visit: Payer: 59

## 2019-08-23 ENCOUNTER — Encounter: Payer: Self-pay | Admitting: Allergy and Immunology

## 2019-08-25 ENCOUNTER — Ambulatory Visit (INDEPENDENT_AMBULATORY_CARE_PROVIDER_SITE_OTHER): Payer: 59

## 2019-08-25 ENCOUNTER — Other Ambulatory Visit: Payer: Self-pay

## 2019-08-25 DIAGNOSIS — L501 Idiopathic urticaria: Secondary | ICD-10-CM | POA: Diagnosis not present

## 2019-08-25 LAB — H. PYLORI BREATH TEST: H pylori Breath Test: NEGATIVE

## 2019-08-30 ENCOUNTER — Other Ambulatory Visit (HOSPITAL_COMMUNITY): Payer: Self-pay | Admitting: Internal Medicine

## 2019-09-10 ENCOUNTER — Encounter: Payer: Self-pay | Admitting: Hematology and Oncology

## 2019-09-10 ENCOUNTER — Encounter: Payer: Self-pay | Admitting: Nurse Practitioner

## 2019-09-16 ENCOUNTER — Other Ambulatory Visit: Payer: Self-pay | Admitting: Cardiology

## 2019-09-22 ENCOUNTER — Other Ambulatory Visit: Payer: Self-pay

## 2019-09-22 ENCOUNTER — Ambulatory Visit (INDEPENDENT_AMBULATORY_CARE_PROVIDER_SITE_OTHER): Payer: 59

## 2019-09-22 DIAGNOSIS — L501 Idiopathic urticaria: Secondary | ICD-10-CM

## 2019-09-23 ENCOUNTER — Ambulatory Visit
Admission: RE | Admit: 2019-09-23 | Discharge: 2019-09-23 | Disposition: A | Discharge status: Home / Self Care | Payer: 59 | Source: Ambulatory Visit | Attending: Hematology and Oncology | Admitting: Hematology and Oncology

## 2019-09-23 ENCOUNTER — Other Ambulatory Visit: Payer: Self-pay | Admitting: Hematology and Oncology

## 2019-09-23 DIAGNOSIS — Z171 Estrogen receptor negative status [ER-]: Secondary | ICD-10-CM

## 2019-09-23 DIAGNOSIS — C50411 Malignant neoplasm of upper-outer quadrant of right female breast: Secondary | ICD-10-CM

## 2019-09-23 IMAGING — US US BREAST*R* LIMITED INC AXILLA
1 series · 8 of 8 positions shown · non-contrast
Comparison: Previous exam(s).

CLINICAL DATA: Patient was recently evaluated for palpable lump
within the outer aspect of the right breast. Patient has history of
mastectomy and implant reconstruction.

EXAM:
ULTRASOUND OF THE RIGHT BREAST

[Series 1: us breast*right* limited inc axilla · 0.03mm/px · 8 of 8 slices shown]
[im 1/8]
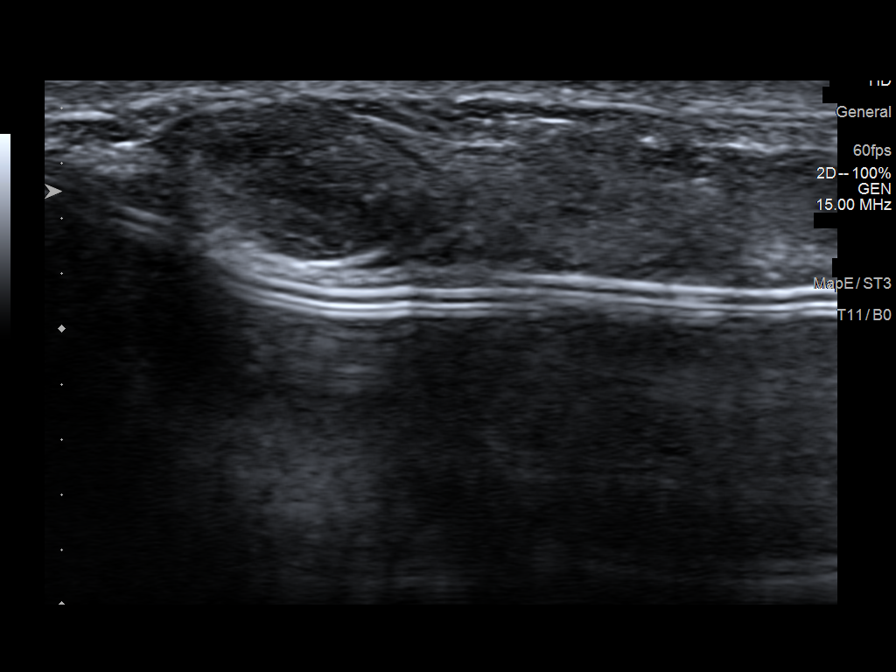
[im 2/8]
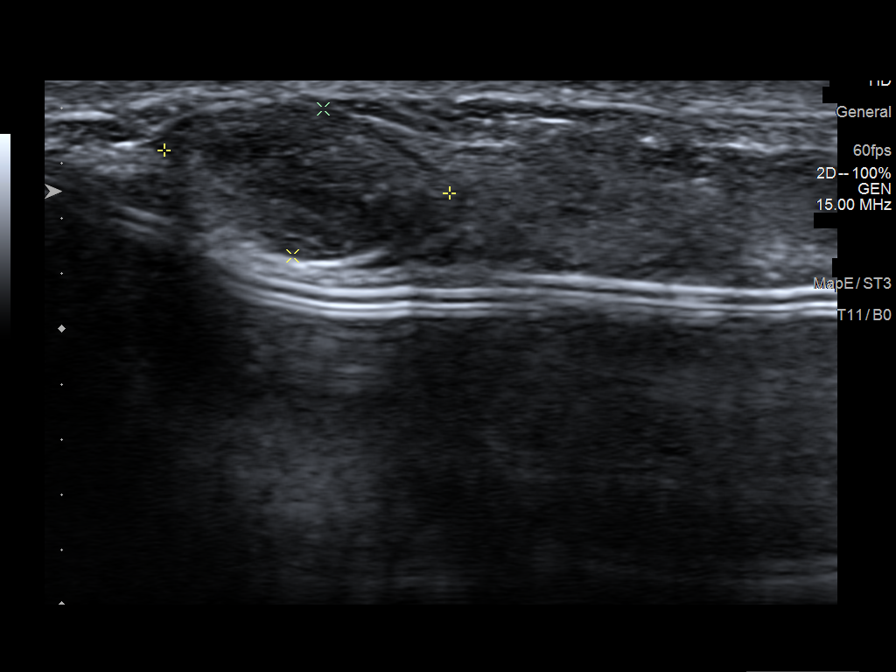
[im 3/8]
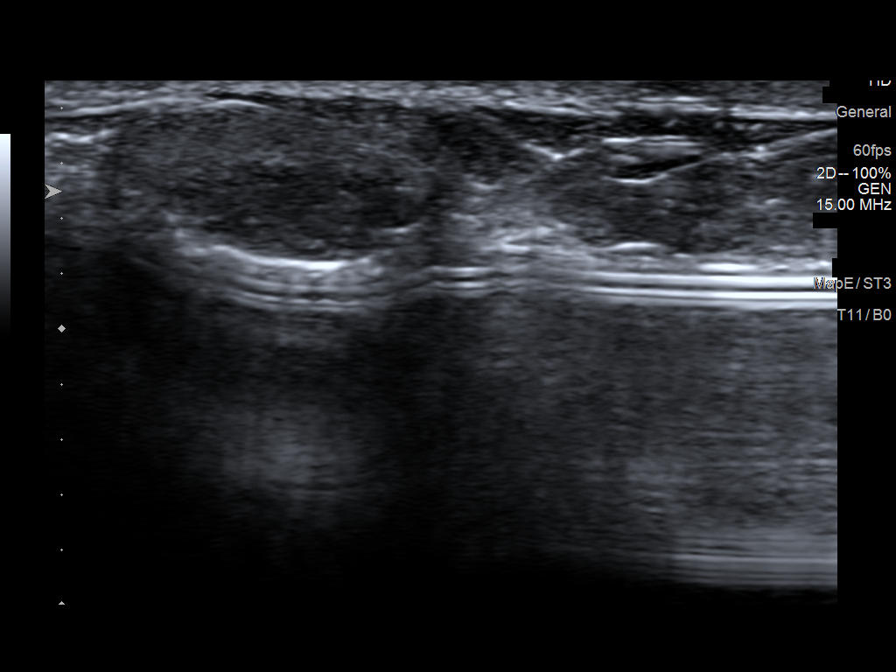
[im 4/8]
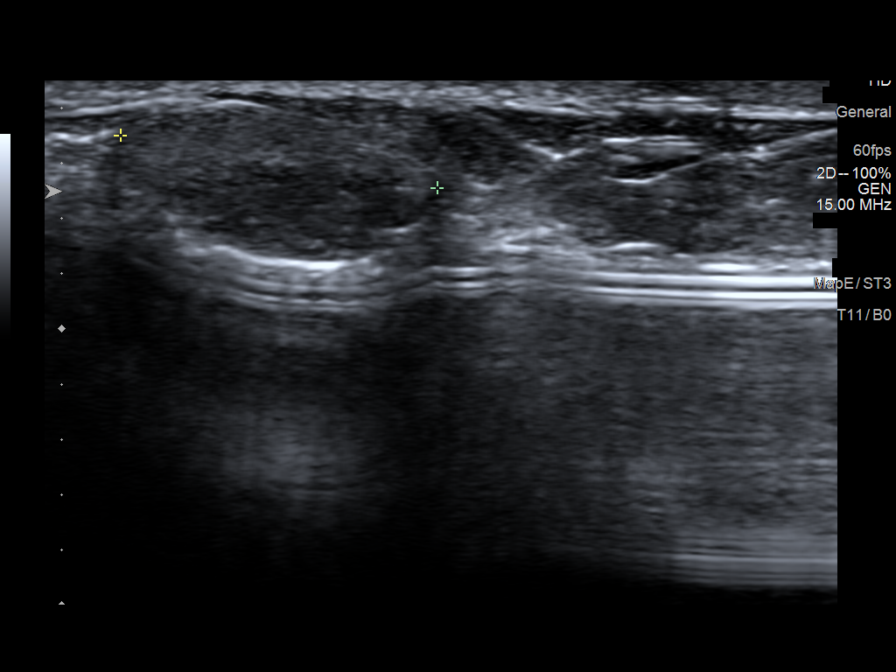
[im 5/8]
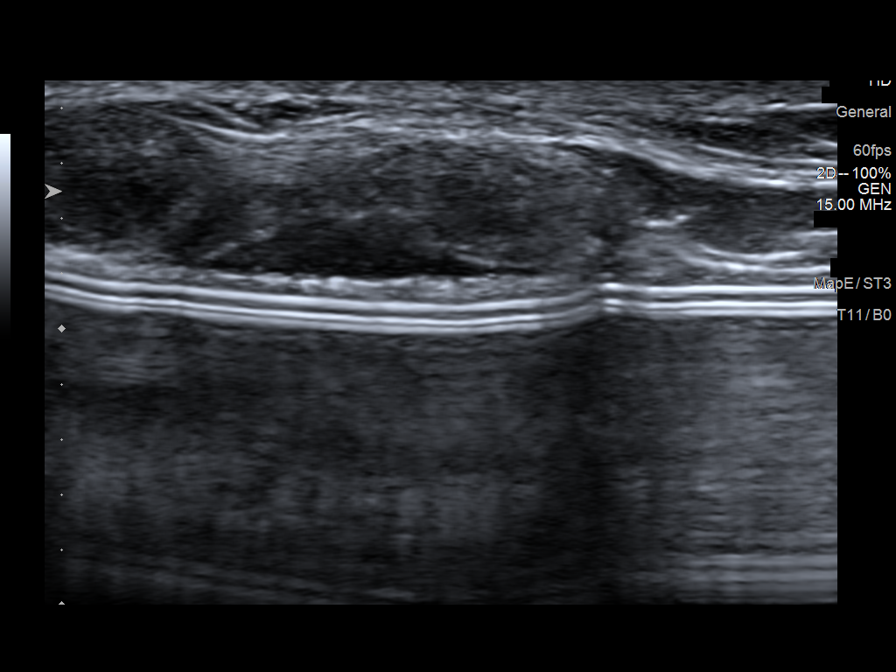
[im 6/8]
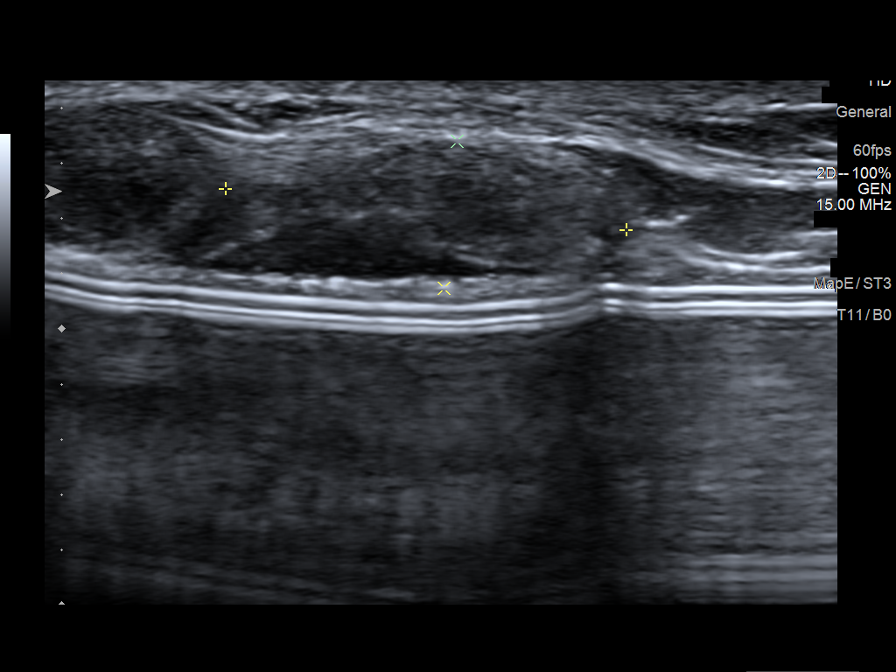
[im 7/8]
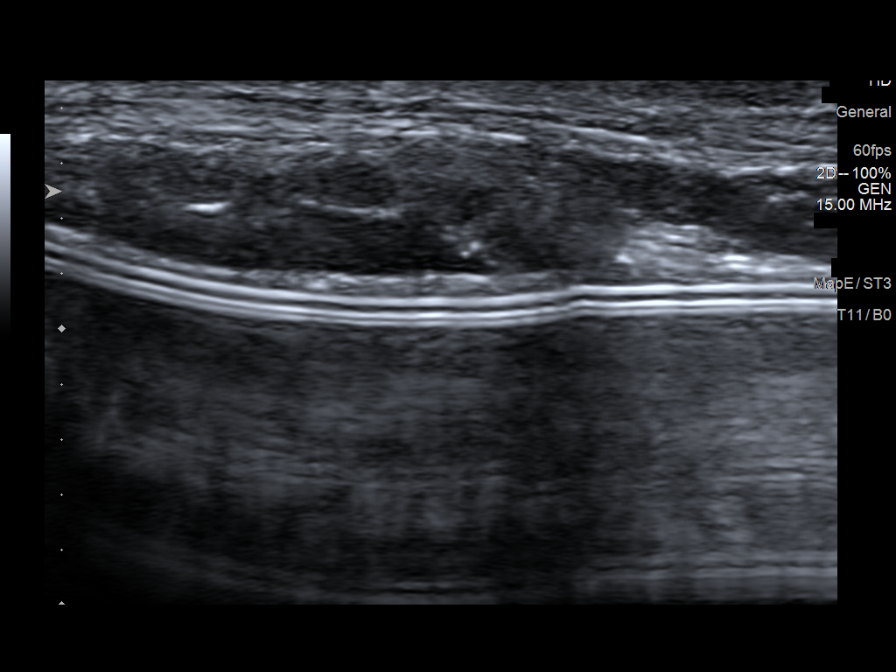
[im 8/8]
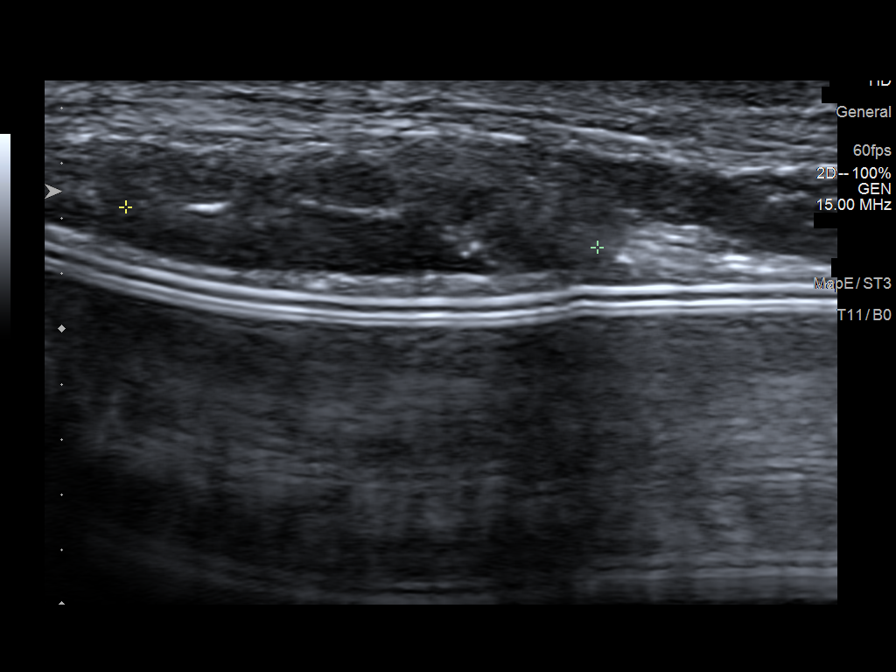

[8 of 8 positions shown; findings below may reference images not displayed]

FINDINGS: On physical exam, there is a small palpable mass within the outer
right breast.

Targeted ultrasound is performed, showing a stable 10 x 5 x 12 mm
oval circumscribed hypoechoic mass at the site of palpable concern
outer right breast. The adjacent 1.5 x 0.5 x 1.7 cm oval
circumscribed hypoechoic mass is also stable. Overall this is
favored to represent a single stable bilobed area of fat necrosis.
The findings suggestive of fat necrosis are most definitive on the
prior mammogram.
IMPRESSION: Findings within the right breast at the site of palpable concern
favored to represent fat necrosis demonstrated best on prior
mammogram. Overall these are stable sonographically.

RECOMMENDATION:
Return at the currently scheduled appointment in 2 months to
reassess the palpable area of concern within the right breast.

I have discussed the findings and recommendations with the patient.
If applicable, a reminder letter will be sent to the patient
regarding the next appointment.

BI-RADS CATEGORY  3: Probably benign.

## 2019-09-29 ENCOUNTER — Ambulatory Visit: Payer: No Typology Code available for payment source

## 2019-09-29 ENCOUNTER — Other Ambulatory Visit: Payer: Self-pay

## 2019-09-29 DIAGNOSIS — I427 Cardiomyopathy due to drug and external agent: Secondary | ICD-10-CM

## 2019-10-01 ENCOUNTER — Telehealth: Payer: Self-pay

## 2019-10-01 ENCOUNTER — Ambulatory Visit (INDEPENDENT_AMBULATORY_CARE_PROVIDER_SITE_OTHER): Payer: No Typology Code available for payment source | Admitting: Nurse Practitioner

## 2019-10-01 ENCOUNTER — Encounter: Payer: Self-pay | Admitting: Nurse Practitioner

## 2019-10-01 ENCOUNTER — Other Ambulatory Visit (INDEPENDENT_AMBULATORY_CARE_PROVIDER_SITE_OTHER): Payer: No Typology Code available for payment source

## 2019-10-01 VITALS — BP 106/80 | HR 89 | Ht 70.0 in | Wt 241.0 lb

## 2019-10-01 DIAGNOSIS — I255 Ischemic cardiomyopathy: Secondary | ICD-10-CM | POA: Diagnosis not present

## 2019-10-01 DIAGNOSIS — R1013 Epigastric pain: Secondary | ICD-10-CM

## 2019-10-01 LAB — CBC WITH DIFFERENTIAL/PLATELET
Basophils Absolute: 0 10*3/uL (ref 0.0–0.1)
Basophils Relative: 0.5 % (ref 0.0–3.0)
Eosinophils Absolute: 0 10*3/uL (ref 0.0–0.7)
Eosinophils Relative: 0 % (ref 0.0–5.0)
HCT: 41 % (ref 36.0–46.0)
Hemoglobin: 13.5 g/dL (ref 12.0–15.0)
Lymphocytes Relative: 23.9 % (ref 12.0–46.0)
Lymphs Abs: 2.3 10*3/uL (ref 0.7–4.0)
MCHC: 32.9 g/dL (ref 30.0–36.0)
MCV: 83.7 fl (ref 78.0–100.0)
Monocytes Absolute: 0.5 10*3/uL (ref 0.1–1.0)
Monocytes Relative: 5.3 % (ref 3.0–12.0)
Neutro Abs: 6.7 10*3/uL (ref 1.4–7.7)
Neutrophils Relative %: 70.3 % (ref 43.0–77.0)
Platelets: 285 10*3/uL (ref 150.0–400.0)
RBC: 4.9 Mil/uL (ref 3.87–5.11)
RDW: 15.7 % — ABNORMAL HIGH (ref 11.5–15.5)
WBC: 9.5 10*3/uL (ref 4.0–10.5)

## 2019-10-01 LAB — C-REACTIVE PROTEIN: CRP: 1.1 mg/dL (ref 0.5–20.0)

## 2019-10-01 LAB — LIPASE: Lipase: 14 U/L (ref 11.0–59.0)

## 2019-10-01 NOTE — Progress Notes (Signed)
10/01/2019 Rebecca [name] [id number] 09/17/1970   CHIEF COMPLAINT: Epigastric pain   HISTORY OF PRESENT ILLNESS:  Rebecca [name] is a 49 year old female with a significant past medical history of hypertension, LBBB, cardiomyopathy,  hypothyroidism, DM II, uticaria,  breast cancer initially diagnosed 06/2017 treated with chemotherapy and a double mastectomy.  She developed cardiomyopathy (EF 30-35%) secondary to her Herceptin chemotherapy.  She complains of having epigastric pain which has been on and off for the past 10 years.  However, she is having worse epigastric pain which occurs daily for the past few weeks.  No associated nausea or vomiting.  Eating does not worsen or improve her pain.  She reports having a high pain tolerance but his pain is severe at times to the point where she is doubled over.  The epigastric pain does not awaken her from sleep.  She is taking Esomeprazole 40 mg once daily.  She has occasional heartburn.  No dysphagia.  She is passing normal brown bowel movement most days.  No rectal bleeding or melena.  He underwent a colonoscopy 03/07/2016 which identified 1 hyperplastic polyp to the rectum and diverticulosis to the sigmoid and descending colon.  She takes Aleve as needed, 1 or 2 tabs once weekly or less.  She drinks 4 cups of coffee daily which she recently changed to 2 cups of decaffeinated coffee daily.  H. Pylori breath test on 08/23/2019 was negative.  She had a repeat echo done 08/20/2018 which showed LV EF 40 to 45%.  Prior ECHO 05/15/2018 showed LVEF 50 to 55%. She denies having any chest pain, palpitations or shortness of breath.   Echo 08/20/2018: 1. The left ventricle has mild-moderately reduced systolic function, with  an ejection fraction of 40-45%. The cavity size was mild to moderately  dilated. Indeterminate diastolic filling due to E-A fusion.  2. Normal GLS -15.3 However GLS was -18.6 in January and EF appears to be  reduced since then.    3. The right ventricle has normal systolic function. The cavity was  normal. There is no increase in right ventricular wall thickness.  4. Left atrial size was mildly dilated.  5. Moderate thickening of the mitral valve leaflet. Mild calcification of  the mitral valve leaflet.  6. The aortic valve is tricuspid. Mild thickening of the aortic valve.  Mild calcification of the aortic valve.     Colonoscopy 03/07/2016 by Dr. Hilarie Fredrickson: - Moderate diverticulosis in the sigmoid colon and in the descending colon. - One 5 mm polyp in the rectum, removed with a cold snare. Resected and retrieved. - Internal hemorrhoids. - The examination was otherwise normal. -10-year recall    Past Medical History:  Diagnosis Date  . Anxiety   . Breast cancer ([place])   . Coronary artery disease   . Diabetes mellitus without complication ([place])   . Diverticulitis   . GERD (gastroesophageal reflux disease)   . Hiatal hernia   . Hypertension   . Hypothyroidism   . LBBB (left bundle branch block)    Past Surgical History:  Procedure Laterality Date  . BREAST RECONSTRUCTION WITH PLACEMENT OF TISSUE EXPANDER AND ALLODERM Bilateral 11/18/2017   Procedure: BILATERAL BREAST RECONSTRUCTION WITH PLACEMENT OF TISSUE EXPANDER AND ALLODERM;  Surgeon: Irene Limbo, MD;  Location: [facility];  Service: Plastics;  Laterality: Bilateral;  . CESAREAN SECTION  2007/2010   x2  . LAPAROSCOPIC TUBAL LIGATION  06/12/2011   Procedure: LAPAROSCOPIC TUBAL LIGATION;  Surgeon: Sharyn Lull  Jinny Sanders, MD;  Location: [facility];  Service: Gynecology;  Laterality: N/A;  filshie clip  . LIPOSUCTION WITH LIPOFILLING Bilateral 02/24/2018   Procedure: LIPOFILLING FROM ABDOMEN TO BILATERAL CHEST;  Surgeon: Irene Limbo, MD;  Location: [facility];  Service: Plastics;  Laterality: Bilateral;  . MASTECTOMY W/ SENTINEL NODE BIOPSY Bilateral 11/18/2017   Procedure: BILATERAL TOTAL MASTECTOMIES WITH RIGHT  SENTINEL LYMPH NODE BIOPSY;  Surgeon: Rolm Bookbinder, MD;  Location: [facility];  Service: General;  Laterality: Bilateral;  . mini tuck  2012   abdominal  . PORT-A-CATH REMOVAL Right 01/05/2019  . PORTACATH PLACEMENT Right 07/15/2017   Procedure: INSERTION PORT-A-CATH WITH ULTRASOUND;  Surgeon: Rolm Bookbinder, MD;  Location: [facility];  Service: General;  Laterality: Right;  . REMOVAL OF BILATERAL TISSUE EXPANDERS WITH PLACEMENT OF BILATERAL BREAST IMPLANTS Bilateral 02/24/2018   Procedure: REMOVAL OF BILATERAL TISSUE EXPANDERS WITH PLACEMENT OF BILATERAL BREAST IMPLANTS;  Surgeon: Irene Limbo, MD;  Location: [facility];  Service: Plastics;  Laterality: Bilateral;  . right breast cancer     upper-outer quadrant   . right ear surg  03/2008   Mohs  . TUBAL LIGATION    . US ECHOCARDIOGRAPHY  12-30-2007   EF 55-60%  . WISDOM TOOTH EXTRACTION     Social History: She quit smoking 14 years ago.  No alcohol or drug use.  Family History: Mother died from cirrhosis at the age of 67 following a complicated gallbladder surgery.  Father with history of lymphoma.    Outpatient Encounter Medications as of 10/01/2019  Medication Sig  . carvedilol (COREG) 12.5 MG tablet Take 1 tablet (12.5 mg total) by mouth 2 (two) times daily.  . cetirizine (ZYRTEC) 10 MG tablet Take 10 mg by mouth daily.  . Cholecalciferol (VITAMIN D3 GUMMIES PO) Take 5,000 Units by mouth daily.  . diphenhydrAMINE HCl (BENADRYL ALLERGY PO) Take by mouth as needed.  Marland Kitchen ENTRESTO 49-51 MG TAKE 1 TABLET BY MOUTH TWICE A DAY  . EPINEPHrine 0.3 mg/0.3 mL IJ SOAJ injection Use as directed for life threatening allergic reactions only  . esomeprazole (NEXIUM) 40 MG capsule TAKE 1 CAPSULE (40 MG TOTAL) BY MOUTH DAILY AT 12 NOON.  . hydrochlorothiazide (HYDRODIURIL) 12.5 MG tablet Take 12.5 mg by mouth daily.  Marland Kitchen levothyroxine (SYNTHROID) 112 MCG tablet Take 112 mcg by mouth daily.  Marland Kitchen  LORazepam (ATIVAN) 1 MG tablet One po bid prn  . meclizine (ANTIVERT) 12.5 MG tablet meclizine 12.5 mg tablet  TAKE 1 TABLET BY MOUTH 3 TIMES DAILY AS NEEDED FOR DIZZINESS.  . metFORMIN (GLUCOPHAGE-XR) 500 MG 24 hr tablet Take 500 mg by mouth 2 (two) times daily.  . Multiple Vitamin (MULTIVITAMIN PO) Take by mouth. Multivitamin + Omega-3  . predniSONE (DELTASONE) 20 MG tablet Take one tab by mouth twice daily for 4 days, then one daily for 3 days. Take with food.  . rosuvastatin (CRESTOR) 20 MG tablet Take 1 tablet (20 mg total) by mouth daily. Must be seen for further refills  . RYBELSUS 3 MG TABS Take 1 tablet by mouth at bedtime.  Marland Kitchen venlafaxine XR (EFFEXOR-XR) 75 MG 24 hr capsule TAKE 1 CAPSULE (75 MG TOTAL) BY MOUTH DAILY WITH BREAKFAST.  . [DISCONTINUED] dapagliflozin propanediol (FARXIGA) 10 MG TABS tablet Take 10 mg by mouth daily.  . [DISCONTINUED] hydrOXYzine (ATARAX/VISTARIL) 50 MG tablet Take one or two PO HS PRN itching and rash (Patient not taking: Reported on 08/16/2019)  . [DISCONTINUED] montelukast (  SINGULAIR) 10 MG tablet Take 10 mg by mouth daily.  . [DISCONTINUED] sucralfate (CARAFATE) 1 g tablet Can take one tablet by mouth three times daily if needed. (Patient not taking: Reported on 08/16/2019)  . [DISCONTINUED] triamcinolone cream (KENALOG) 0.1 % Apply 1 application topically 2 (two) times daily. 60 grams mixed 1:1 with 40% eucerin cream  Apply to rash 3 times a day   Facility-Administered Encounter Medications as of 10/01/2019  Medication  . LORazepam (ATIVAN) injection 1 mg  . omalizumab Arvid Right) injection 300 mg     REVIEW OF SYSTEMS: All other systems reviewed and negative except where noted in the History of Present Illness.   PHYSICAL EXAM: BP 106/80   Pulse 89   Ht 5\' 10"  (1.778 m)   Wt 241 lb (109.3 kg)   BMI 34.58 kg/m  General: Well developed 49 year old female in no acute distress. Head: Normocephalic and atraumatic. Eyes:  Sclerae non-icteric,  conjunctive pink. Ears: Normal auditory acuity. Mouth: Dentition intact. No ulcers or lesions.  Neck: Supple, no lymphadenopathy or thyromegaly.  Lungs: Clear bilaterally to auscultation without wheezes, crackles or rhonchi. Heart: Regular rate and rhythm. No murmur, rub or gallop appreciated.  Abdomen: Soft, nontender, non distended. No masses. No hepatosplenomegaly. Normoactive bowel sounds x 4 quadrants.  Umbilical hernia and right lower abdominal hernia appreciated.  Umbilical and lower abdominal tummy tuck scar intact. Rectal: Deferred. Musculoskeletal: Symmetrical with no gross deformities. Skin: Warm and dry. No rash or lesions on visible extremities. Extremities: No edema. Neurological: Alert oriented x 4, no focal deficits.  Psychological:  Alert and cooperative. Normal mood and affect.  ASSESSMENT AND PLAN:  30.  49 year old female with epigastric pain -CBC, CRP, CMP and lipase -Abdominal sonogram -Increase Esomeprazole 40 mg p.o. twice daily -Further discuss scheduling an EGD after the above results received -Patient to call our office if her symptoms worsen -She is to present to the emergency room if she develops severe abdominal pain  2.  History of breast cancer  E.  History of cardiomyopathy secondary to chemotherapy.  4.  History of a hyperplastic rectal polyp -Next colonoscopy due November 2027    CC:  Elby Showers, MD

## 2019-10-01 NOTE — Patient Instructions (Addendum)
If you are age 50 or older, your body mass index should be between 23-30. Your Body mass index is 34.58 kg/m. If this is out of the aforementioned range listed, please consider follow up with your Primary Care Provider.  If you are age 36 or younger, your body mass index should be between 19-25. Your Body mass index is 34.58 kg/m. If this is out of the aformentioned range listed, please consider follow up with your Primary Care Provider.   Your provider has requested that you go to the basement level for lab work before leaving today. Press "B" on the elevator. The lab is located at the first door on the left as you exit the elevator.  Due to recent changes in healthcare laws, you may see the results of your imaging and laboratory studies on MyChart before your provider has had a chance to review them.  We understand that in some cases there may be results that are confusing or concerning to you. Not all laboratory results come back in the same time frame and the provider may be waiting for multiple results in order to interpret others.  Please give [place] 48 hours in order for your provider to thoroughly review all the results before contacting the office for clarification of your results.   You have been scheduled for an abdominal ultrasound at [facility] (1st floor of hospital) on 10/08/19 at 9:30 am. Please arrive 15 minutes prior to your appointment for registration. Make certain not to have anything to eat or drink starting at midnight. Should you need to reschedule your appointment, please contact radiology at [phone]. This test typically takes about 30 minutes to perform.  INCREASE Nexium to 20 mg 1 capsule twice daily.  We will contact your cardiologist for cardiac clearance to proceed with scheduling an endoscopy.   Please go to the nearest Emergency Room if your symptoms worsen.

## 2019-10-01 NOTE — Telephone Encounter (Signed)
[facility] Pre-operative Risk Assessment     Request for surgical clearance:     Endoscopy Procedure  This patient was seen today, 10/01/19, by Carl Best, [place]. We would like to obtain Cardiac Clearance for her to be scheduled for an Endoscopy.  When is this surgery scheduled?     Pending clearance  What type of clearance is required ?   Pharmacy  Practice name and name of physician performing surgery?      [facility]  What is your office phone and fax number?      Phone- [phone]  [phone]  Anesthesia type (None, local, MAC, general) ?       MAC

## 2019-10-04 ENCOUNTER — Other Ambulatory Visit: Payer: Self-pay | Admitting: Internal Medicine

## 2019-10-04 ENCOUNTER — Ambulatory Visit: Payer: Self-pay | Admitting: Cardiology

## 2019-10-04 ENCOUNTER — Telehealth: Payer: Self-pay | Admitting: Allergy and Immunology

## 2019-10-04 MED ORDER — PREDNISONE 10 MG PO TABS: 10.0000 mg | ORAL_TABLET | Freq: Every day | ORAL | 0 refills | Status: AC

## 2019-10-04 NOTE — Telephone Encounter (Signed)
Patient called and said that her hives are bad and she need some predisone to help with it. She is doing the xolair injection. [facility] [address] [phone]

## 2019-10-04 NOTE — Telephone Encounter (Signed)
Please make sure she is using the following:  1.  Omalizumab injections every 4 weeks   2.  Cetirizine 10 mg -1-2 tablets 2 times per day (MAX=40mg )   3.  Montelukast 10 mg - 1 tablet 1 time per day   4.  Eucerin if needed   5.  Can add benadryl if needed  Can add prednisone 10 mg daily for 10 days only

## 2019-10-04 NOTE — Telephone Encounter (Signed)
Dr. Neldon Mc are you okay with this?

## 2019-10-04 NOTE — Telephone Encounter (Signed)
Spoke with patient and she stated she is taking everything that is prescribed by Dr. Neldon Mc and stated she only uses the prednisone as needed basis. She is atteninding her daughters middle school graduation and didn't want this to disturb her during the ceremony. Confirmed with patient [facility] [address], [place].

## 2019-10-05 NOTE — Progress Notes (Signed)
Addendum: Reviewed and agree with assessment and management plan. Venezia Sargeant M, MD  

## 2019-10-08 ENCOUNTER — Ambulatory Visit (HOSPITAL_COMMUNITY)
Admission: RE | Admit: 2019-10-08 | Discharge: 2019-10-08 | Disposition: A | Discharge status: Home / Self Care | Payer: No Typology Code available for payment source | Source: Ambulatory Visit | Attending: Nurse Practitioner | Admitting: Nurse Practitioner

## 2019-10-08 ENCOUNTER — Other Ambulatory Visit: Payer: Self-pay

## 2019-10-08 DIAGNOSIS — R1013 Epigastric pain: Secondary | ICD-10-CM | POA: Diagnosis not present

## 2019-10-08 DIAGNOSIS — I255 Ischemic cardiomyopathy: Secondary | ICD-10-CM

## 2019-10-08 IMAGING — US US ABDOMEN COMPLETE
1 series · 14 of 25 positions shown · non-contrast
Comparison: None.

CLINICAL DATA: Epigastric pain

EXAM:
ABDOMEN ULTRASOUND COMPLETE

[Series 1: us abdomen complete · 14 of 112 slices shown]
[im 1/112]
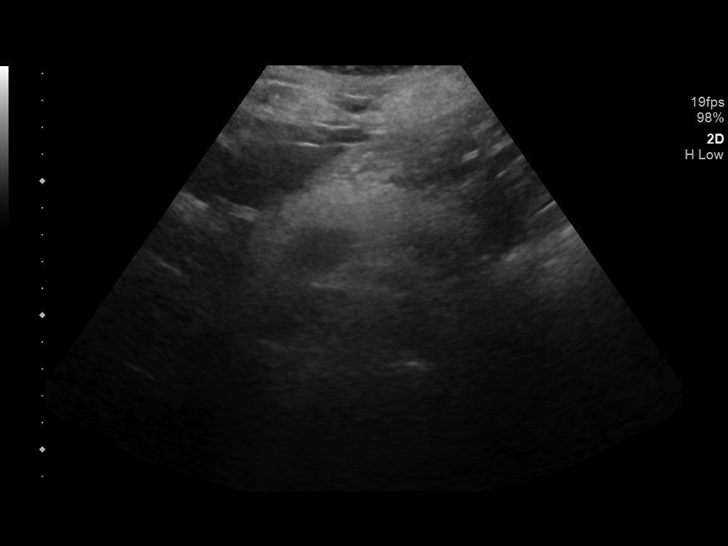
[im 10/112]
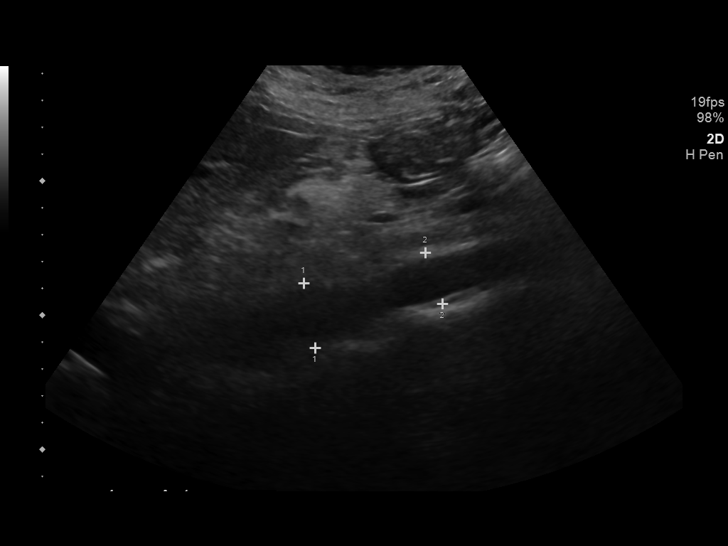
[im 19/112]
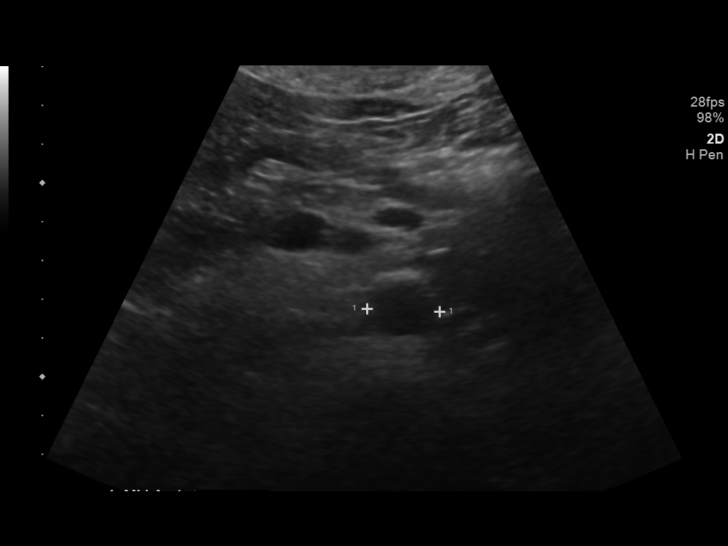
[im 28/112]
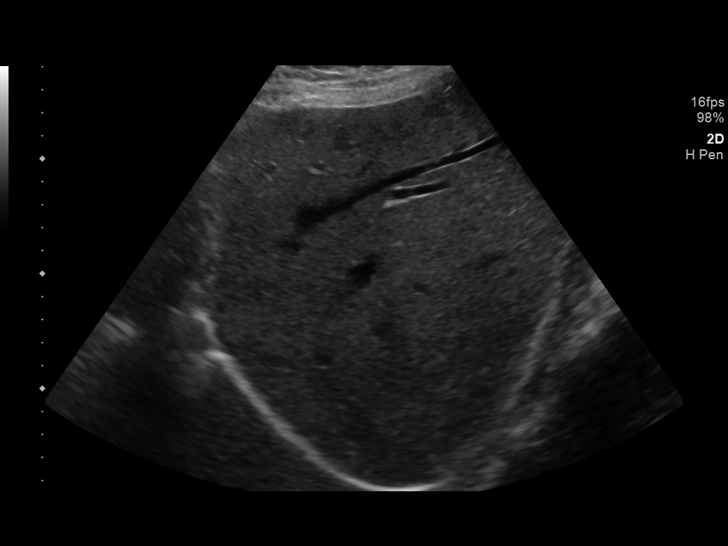
[im 38/112]
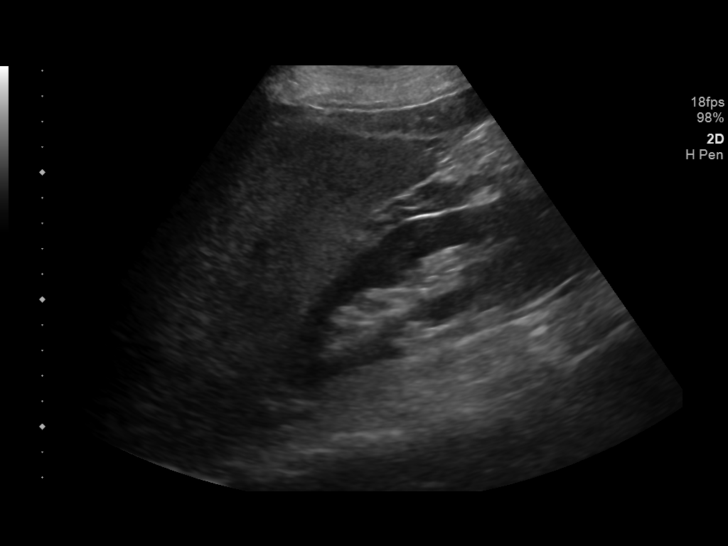
[im 42/112]
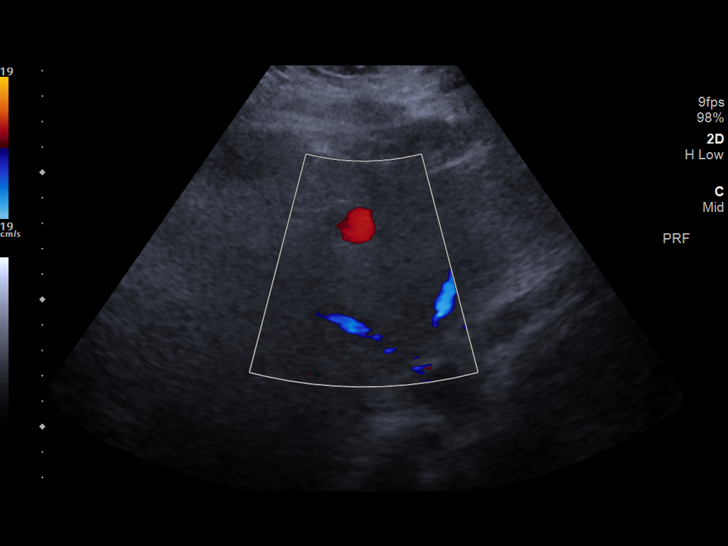
[im 51/112]
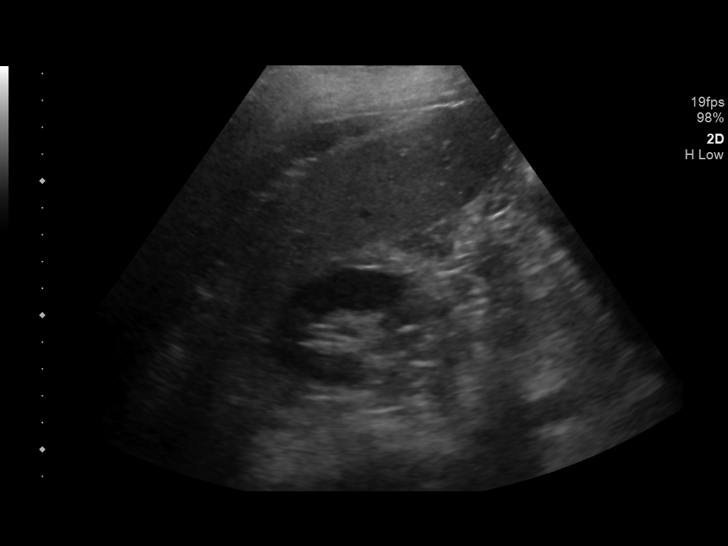
[im 61/112]
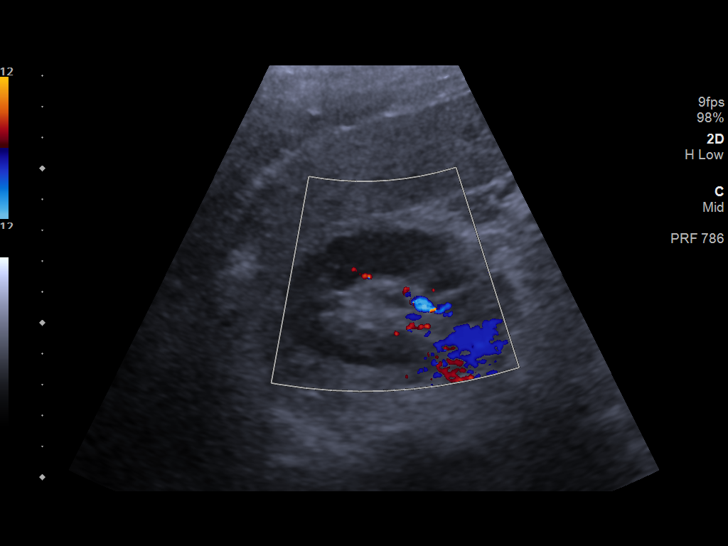
[im 70/112]
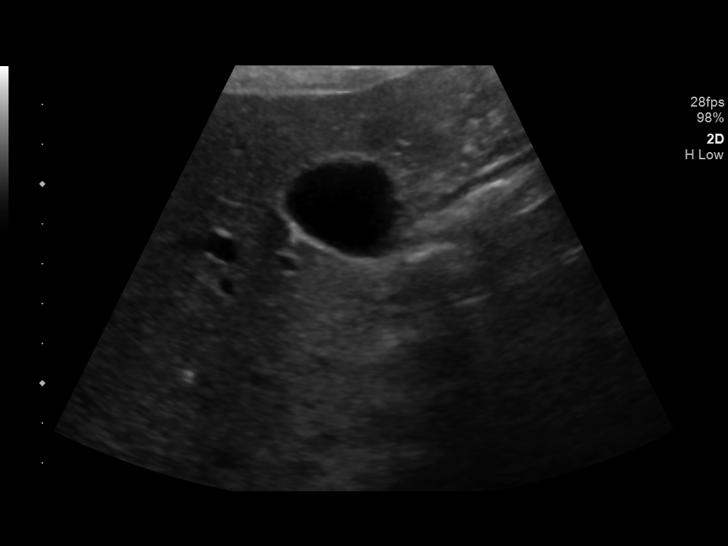
[im 75/112]
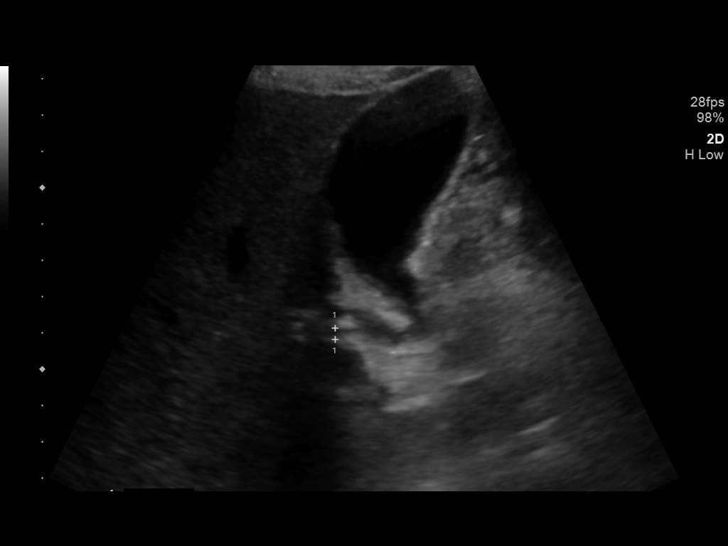
[im 84/112]
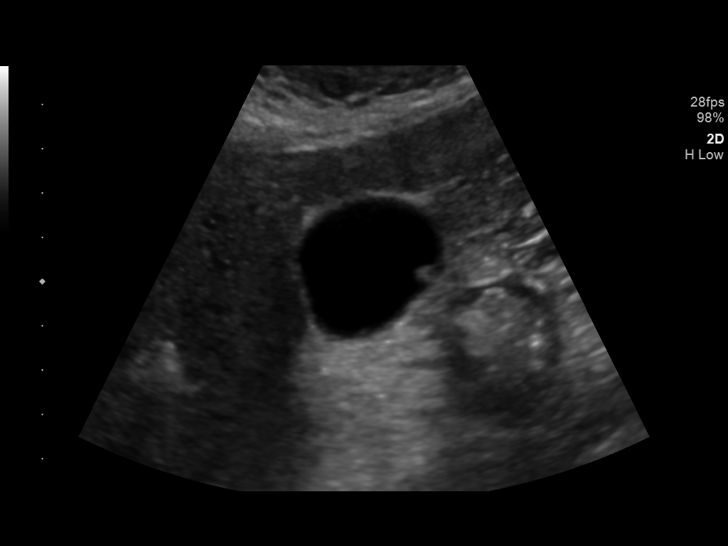
[im 93/112]
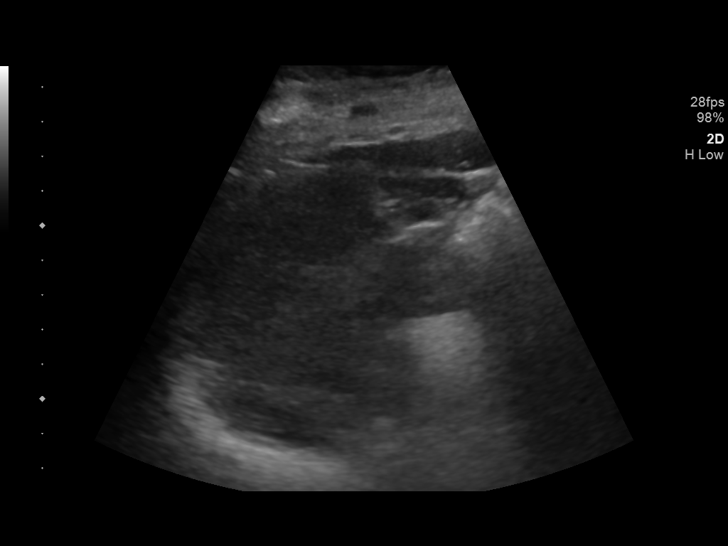
[im 102/112]
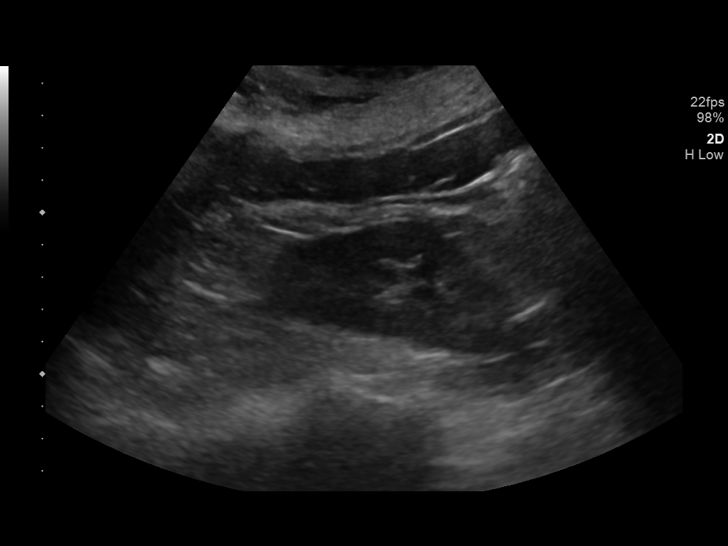
[im 112/112]
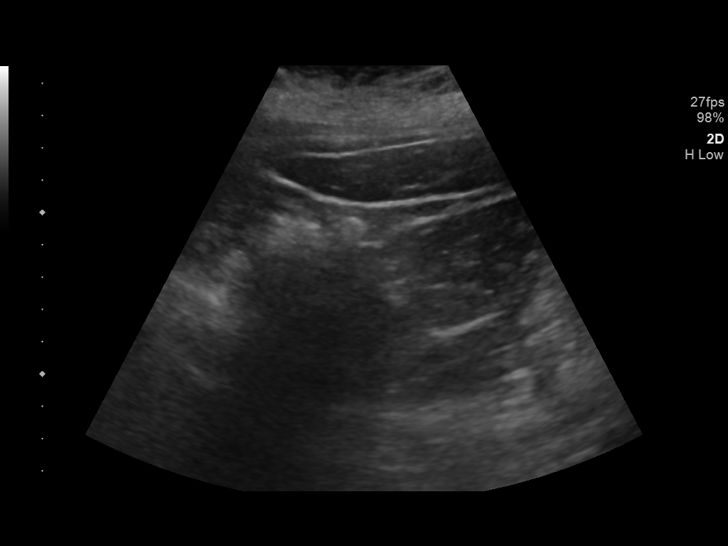

[14 of 25 positions shown; findings below may reference images not displayed]

FINDINGS: Gallbladder: At least 2 gallbladder wall polyps, the largest 6 mm.
No stones or wall thickening.

Common bile duct: Diameter: Normal caliber, 3 mm.

Liver: Slight increased echotexture suggesting early fatty
infiltration. No focal abnormality. Portal vein is patent on color
Doppler imaging with normal direction of blood flow towards the
liver.

IVC: No abnormality visualized.

Pancreas: Visualized portion unremarkable.

Spleen: Size and appearance within normal limits.

Right Kidney: Length: 11.7 cm. Echogenicity within normal limits. No
mass or hydronephrosis visualized.

Left Kidney: Length: 12.0 cm. Echogenicity within normal limits. No
mass or hydronephrosis visualized.

Abdominal aorta: No aneurysm visualized.

Other findings: None.
IMPRESSION: Suspect early fatty infiltration of the liver.

Small gallbladder wall polyps.

## 2019-10-13 NOTE — Telephone Encounter (Signed)
Beth, can you change abdominal sonogram recall to 6 months per the patient's request. Thx

## 2019-10-14 ENCOUNTER — Other Ambulatory Visit: Payer: Self-pay

## 2019-10-14 DIAGNOSIS — K824 Cholesterolosis of gallbladder: Secondary | ICD-10-CM

## 2019-10-20 ENCOUNTER — Ambulatory Visit (AMBULATORY_SURGERY_CENTER): Payer: Self-pay

## 2019-10-20 ENCOUNTER — Encounter: Payer: Self-pay | Admitting: Internal Medicine

## 2019-10-20 ENCOUNTER — Ambulatory Visit (INDEPENDENT_AMBULATORY_CARE_PROVIDER_SITE_OTHER): Payer: No Typology Code available for payment source

## 2019-10-20 ENCOUNTER — Other Ambulatory Visit: Payer: Self-pay

## 2019-10-20 VITALS — Ht 70.0 in | Wt 244.4 lb

## 2019-10-20 DIAGNOSIS — L501 Idiopathic urticaria: Secondary | ICD-10-CM | POA: Diagnosis not present

## 2019-10-20 DIAGNOSIS — R1013 Epigastric pain: Secondary | ICD-10-CM

## 2019-10-20 MED ORDER — MONTELUKAST SODIUM 10 MG PO TABS
10.0000 mg | ORAL_TABLET | Freq: Every day | ORAL | 5 refills | Status: DC
Start: 2019-10-20 — End: 2019-11-15

## 2019-10-20 NOTE — Progress Notes (Signed)
she receives Xolair for hives every 4 weeks. she was given a 10 day course of pred back on 6/7 for a hive flare after her last injection. She was not taking the montelukast as she did not know she was supposed to be taking it. she is going out of town tomorrow and would like to know if she could have something to take with her? Per conversation with Dr. Nelva Bush I am giving patient a Wednesday pack of prednisone.

## 2019-10-20 NOTE — Progress Notes (Signed)
No allergies to soy or egg Pt is not on blood thinners or diet pills Denies issues with sedation/intubation Denies atrial flutter/fib Denies constipation   Pt is aware of Covid safety and care partner requirements.      

## 2019-10-29 ENCOUNTER — Encounter: Payer: Self-pay | Admitting: Allergy and Immunology

## 2019-11-08 ENCOUNTER — Encounter: Payer: No Typology Code available for payment source | Admitting: Internal Medicine

## 2019-11-11 ENCOUNTER — Other Ambulatory Visit: Payer: 59

## 2019-11-14 ENCOUNTER — Other Ambulatory Visit: Payer: Self-pay | Admitting: Allergy

## 2019-11-17 ENCOUNTER — Ambulatory Visit: Payer: Self-pay

## 2019-11-21 ENCOUNTER — Other Ambulatory Visit: Payer: Self-pay | Admitting: Internal Medicine

## 2019-11-21 ENCOUNTER — Other Ambulatory Visit: Payer: Self-pay | Admitting: Hematology and Oncology

## 2019-11-22 ENCOUNTER — Ambulatory Visit (INDEPENDENT_AMBULATORY_CARE_PROVIDER_SITE_OTHER): Payer: No Typology Code available for payment source

## 2019-11-22 ENCOUNTER — Other Ambulatory Visit: Payer: Self-pay

## 2019-11-22 DIAGNOSIS — L501 Idiopathic urticaria: Secondary | ICD-10-CM

## 2019-11-24 ENCOUNTER — Other Ambulatory Visit (HOSPITAL_COMMUNITY): Payer: Self-pay | Admitting: Internal Medicine

## 2019-12-16 ENCOUNTER — Other Ambulatory Visit: Payer: Self-pay | Admitting: *Deleted

## 2019-12-16 DIAGNOSIS — Z171 Estrogen receptor negative status [ER-]: Secondary | ICD-10-CM

## 2019-12-16 DIAGNOSIS — C50411 Malignant neoplasm of upper-outer quadrant of right female breast: Secondary | ICD-10-CM

## 2019-12-20 ENCOUNTER — Telehealth: Payer: Self-pay

## 2019-12-20 ENCOUNTER — Ambulatory Visit (INDEPENDENT_AMBULATORY_CARE_PROVIDER_SITE_OTHER): Payer: No Typology Code available for payment source

## 2019-12-20 ENCOUNTER — Other Ambulatory Visit: Payer: Self-pay

## 2019-12-20 DIAGNOSIS — L501 Idiopathic urticaria: Secondary | ICD-10-CM

## 2019-12-20 NOTE — Telephone Encounter (Signed)
Just spoke with Rebecca [name], she states that she has her own dermatologist that she will go to. I told her to let us know if she changes her mind.

## 2019-12-20 NOTE — Telephone Encounter (Signed)
Please refer patient to [facility] dermatology appointment for chronic urticaria as the plan that we have established for her is not providing optimal control of her hives.

## 2019-12-20 NOTE — Telephone Encounter (Signed)
Patient came in to get her Xolair injection and notified me that she is still breaking out in hives throughout her body. Patient stated she hasn't notice any improvements from her injection. Patient stated she had a few prednisone left from her previous flare ups and use them up to help with her hives, which are now lasting a whole month until her next injection of Xolair. Patient would like to know what else can be done to help prevent her hive breakouts.

## 2019-12-21 ENCOUNTER — Other Ambulatory Visit: Payer: Self-pay | Admitting: Hematology and Oncology

## 2019-12-21 ENCOUNTER — Ambulatory Visit
Admission: RE | Admit: 2019-12-21 | Discharge: 2019-12-21 | Disposition: A | Discharge status: Home / Self Care | Payer: No Typology Code available for payment source | Source: Ambulatory Visit | Attending: Hematology and Oncology | Admitting: Hematology and Oncology

## 2019-12-21 DIAGNOSIS — N6310 Unspecified lump in the right breast, unspecified quadrant: Secondary | ICD-10-CM

## 2019-12-21 IMAGING — US US BREAST*R* LIMITED INC AXILLA
2 series · 10 of 10 positions shown · non-contrast
Comparison: Previous exam(s).

CLINICAL DATA: Patient for short-term follow-up palpable lump outer
right breast. Patient has history of mastectomy and implant
reconstruction.

EXAM:
ULTRASOUND OF THE RIGHT BREAST

[Series 1: us breast*right* limited inc axilla · 0.04mm/px · 8 of 8 slices shown (1 of 2)]
[im 1/8]
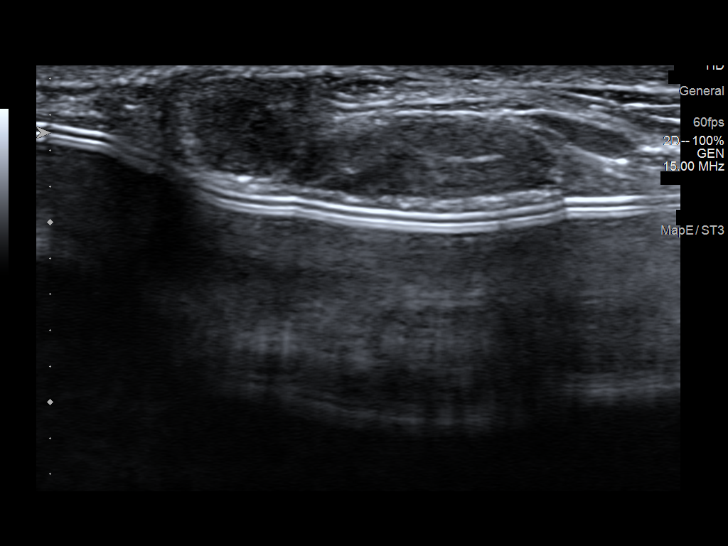
[im 2/8]
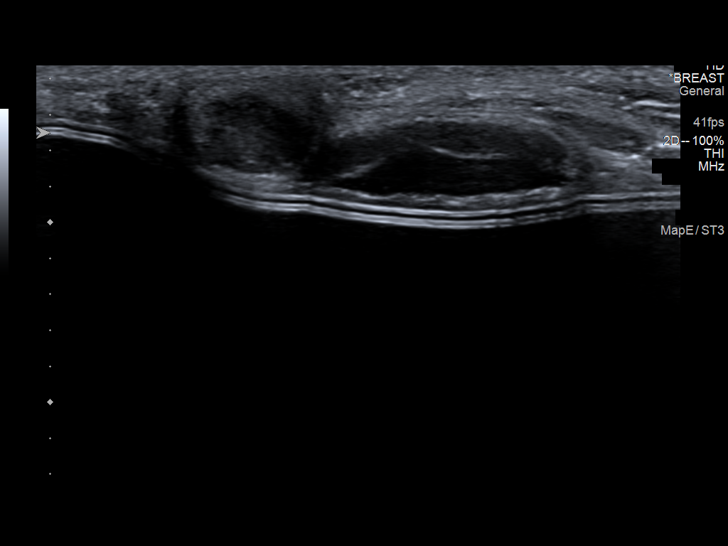
[im 3/8]
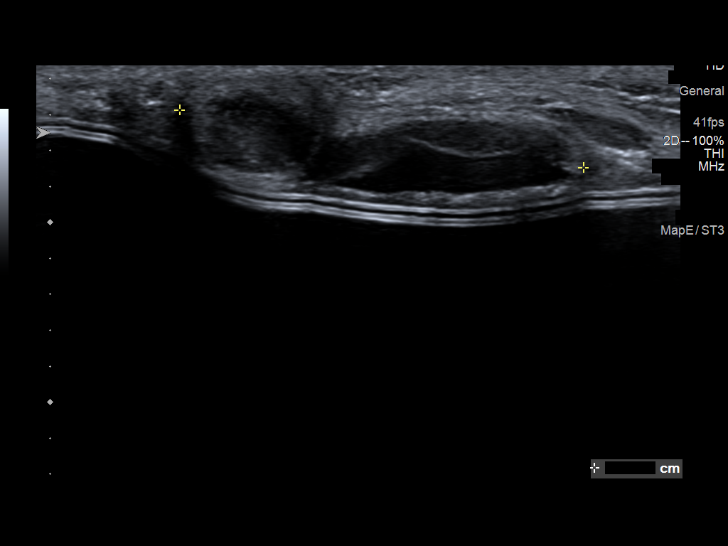
[im 4/8]
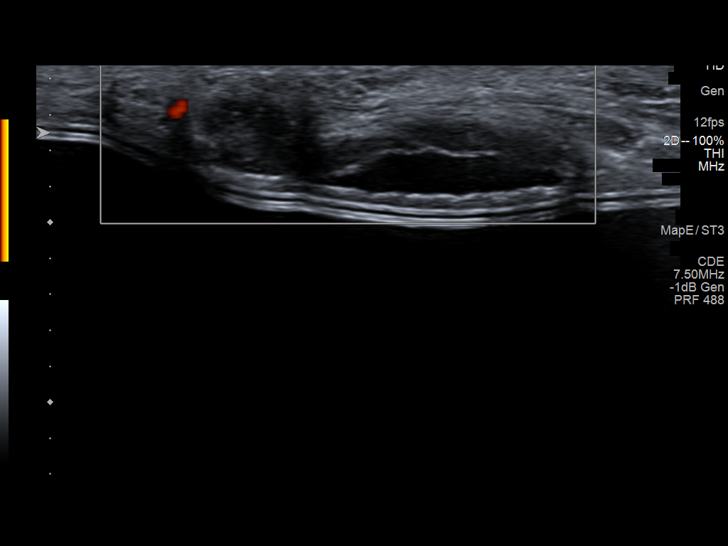
[im 5/8]
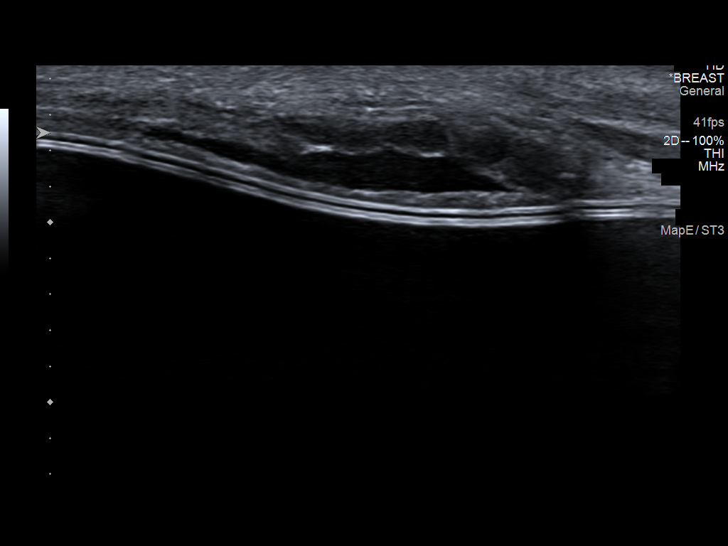
[im 6/8]
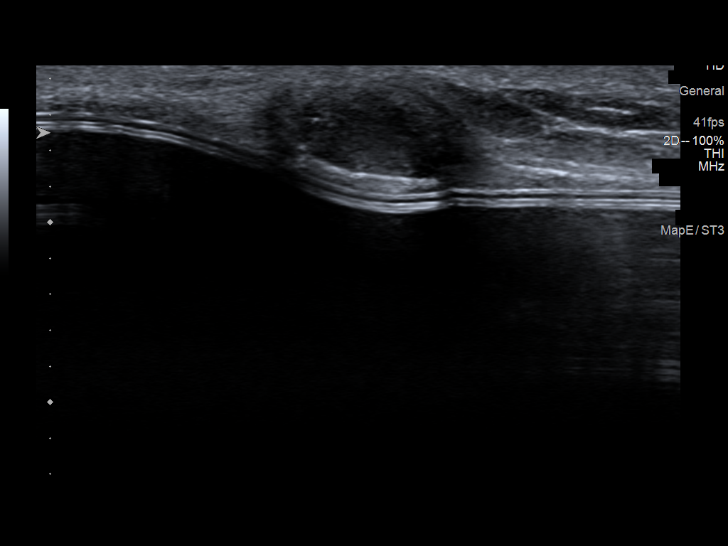
[im 7/8]
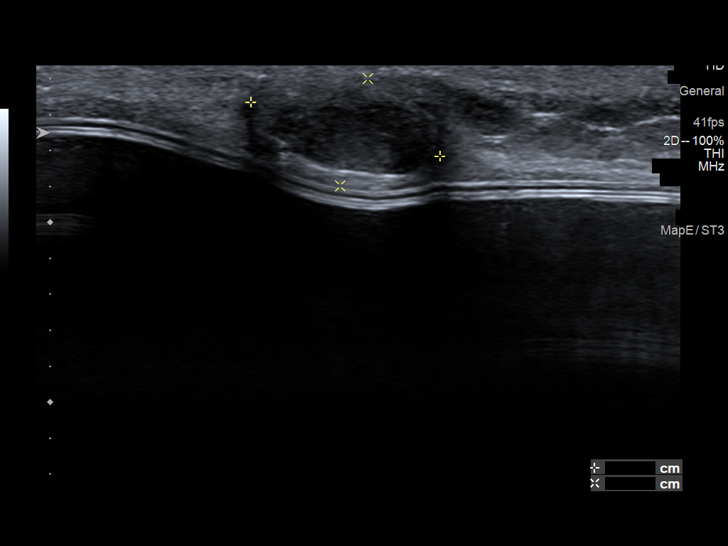
[im 8/8]
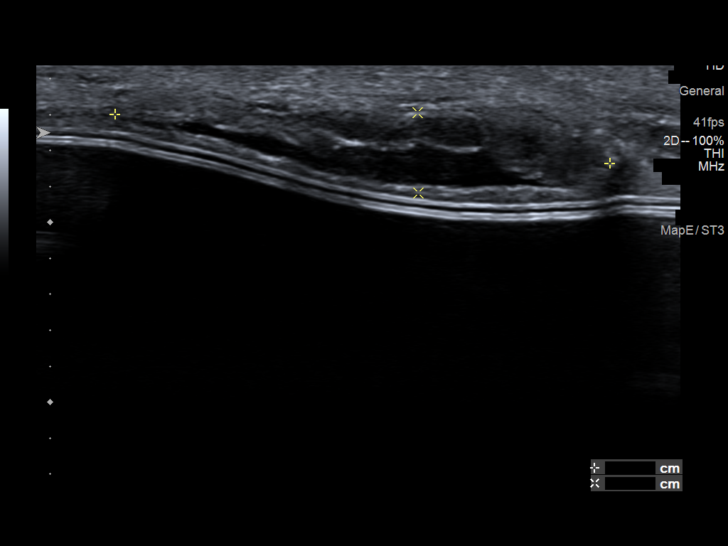

[Series 2: us breast*right* limited inc axilla · 0.04mm/px · 2 of 2 slices shown (2 of 2)]
[im 1/2]
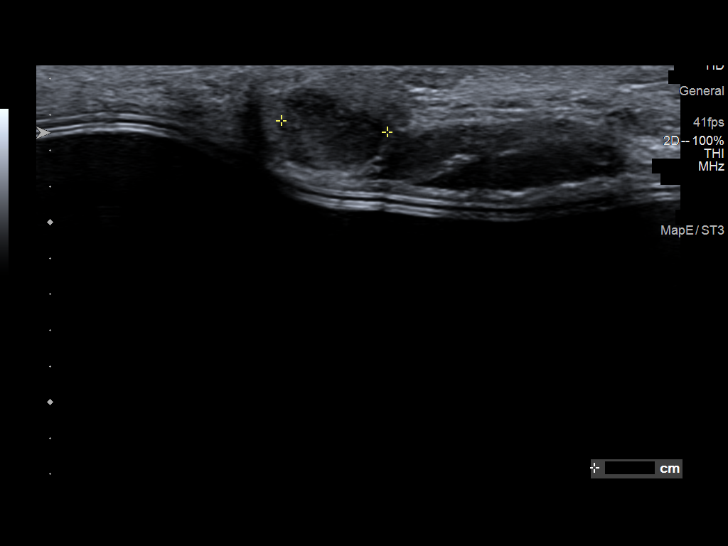
[im 2/2]
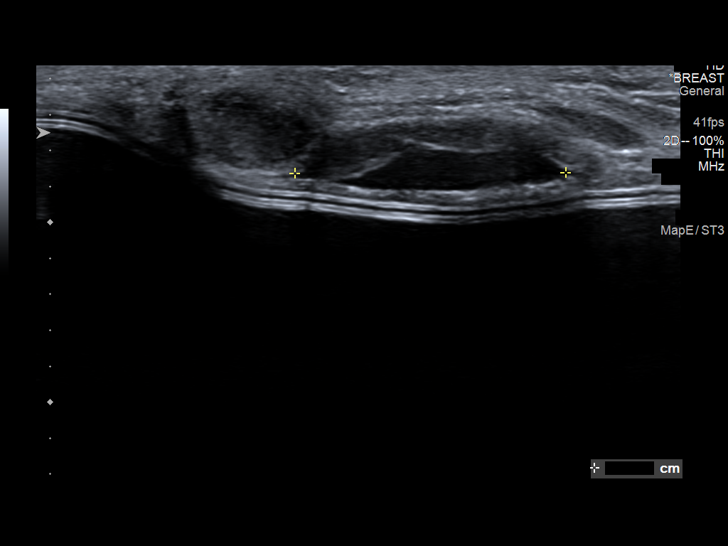

[10 of 10 positions shown; findings below may reference images not displayed]

FINDINGS: Targeted ultrasound is performed, showing a stable 11 x 6 x 6 mm
oval circumscribed hypoechoic mass at the site of palpable concern
right breast 8:30 o'clock 8 cm from nipple.

The adjacent 14 x 5 x 24 mm oval circumscribed hypoechoic mass is
also stable, previously measuring 25 x 6 x 16 mm.
IMPRESSION: Stable findings within the right breast at the site of palpable
concern favored to represent fat necrosis.

RECOMMENDATION:
Patient is instructed to return in 2 months to reassess the palpable
area of concern within the right breast. At this time, patient would
be due for spot compression mammography as well as ultrasound.

I have discussed the findings and recommendations with the patient.
If applicable, a reminder letter will be sent to the patient
regarding the next appointment.

BI-RADS CATEGORY  3: Probably benign.

## 2019-12-21 NOTE — Telephone Encounter (Signed)
Please inform Rebecca [name] that she is welcome to stay on omalizumab.  Hopefully she will obtain some significant benefit with continued use of this medication.

## 2019-12-21 NOTE — Telephone Encounter (Signed)
From pt

## 2019-12-21 NOTE — Telephone Encounter (Signed)
Patient called back and stated she would like to continue being on Xolair injections.

## 2019-12-21 NOTE — Telephone Encounter (Signed)
Rebecca [name] called in and decided she would like to try Xolair a little longer.  Rebecca [name] does want to know if there is a stronger dose of Xolair?  She states she is still breaking out in hives.  Rebecca [name] states she spoke to her dermatologist and they want to put her on medications to try for the hives but they are for gout and leprosy and Rebecca [name] didn't want to start taking those as to why she wants to continue Xolair maybe at a higher dose.  Please advise.

## 2019-12-24 ENCOUNTER — Ambulatory Visit: Payer: No Typology Code available for payment source | Admitting: Internal Medicine

## 2019-12-24 ENCOUNTER — Encounter: Payer: Self-pay | Admitting: Internal Medicine

## 2019-12-24 ENCOUNTER — Other Ambulatory Visit: Payer: Self-pay

## 2019-12-24 ENCOUNTER — Telehealth (INDEPENDENT_AMBULATORY_CARE_PROVIDER_SITE_OTHER): Payer: No Typology Code available for payment source | Admitting: Internal Medicine

## 2019-12-24 ENCOUNTER — Telehealth: Payer: Self-pay | Admitting: Internal Medicine

## 2019-12-24 VITALS — BP 89/69 | HR 82 | Ht 70.0 in

## 2019-12-24 DIAGNOSIS — Z9013 Acquired absence of bilateral breasts and nipples: Secondary | ICD-10-CM

## 2019-12-24 DIAGNOSIS — U071 COVID-19: Secondary | ICD-10-CM

## 2019-12-24 DIAGNOSIS — Z6835 Body mass index (BMI) 35.0-35.9, adult: Secondary | ICD-10-CM

## 2019-12-24 DIAGNOSIS — Z283 Underimmunization status: Secondary | ICD-10-CM

## 2019-12-24 DIAGNOSIS — Z7984 Long term (current) use of oral hypoglycemic drugs: Secondary | ICD-10-CM

## 2019-12-24 DIAGNOSIS — E7439 Other disorders of intestinal carbohydrate absorption: Secondary | ICD-10-CM

## 2019-12-24 DIAGNOSIS — F419 Anxiety disorder, unspecified: Secondary | ICD-10-CM | POA: Diagnosis present

## 2019-12-24 DIAGNOSIS — I427 Cardiomyopathy due to drug and external agent: Secondary | ICD-10-CM | POA: Diagnosis not present

## 2019-12-24 DIAGNOSIS — Z87891 Personal history of nicotine dependence: Secondary | ICD-10-CM

## 2019-12-24 DIAGNOSIS — E669 Obesity, unspecified: Secondary | ICD-10-CM | POA: Diagnosis present

## 2019-12-24 DIAGNOSIS — I5023 Acute on chronic systolic (congestive) heart failure: Secondary | ICD-10-CM | POA: Diagnosis not present

## 2019-12-24 DIAGNOSIS — Z79899 Other long term (current) drug therapy: Secondary | ICD-10-CM

## 2019-12-24 DIAGNOSIS — E1165 Type 2 diabetes mellitus with hyperglycemia: Secondary | ICD-10-CM | POA: Diagnosis present

## 2019-12-24 DIAGNOSIS — Z9221 Personal history of antineoplastic chemotherapy: Secondary | ICD-10-CM

## 2019-12-24 DIAGNOSIS — Z853 Personal history of malignant neoplasm of breast: Secondary | ICD-10-CM | POA: Diagnosis not present

## 2019-12-24 DIAGNOSIS — E785 Hyperlipidemia, unspecified: Secondary | ICD-10-CM | POA: Diagnosis present

## 2019-12-24 DIAGNOSIS — T451X5A Adverse effect of antineoplastic and immunosuppressive drugs, initial encounter: Secondary | ICD-10-CM | POA: Diagnosis present

## 2019-12-24 DIAGNOSIS — F909 Attention-deficit hyperactivity disorder, unspecified type: Secondary | ICD-10-CM | POA: Diagnosis present

## 2019-12-24 DIAGNOSIS — K219 Gastro-esophageal reflux disease without esophagitis: Secondary | ICD-10-CM | POA: Diagnosis present

## 2019-12-24 DIAGNOSIS — I110 Hypertensive heart disease with heart failure: Secondary | ICD-10-CM | POA: Diagnosis present

## 2019-12-24 DIAGNOSIS — J1282 Pneumonia due to coronavirus disease 2019: Secondary | ICD-10-CM | POA: Diagnosis present

## 2019-12-24 DIAGNOSIS — J9601 Acute respiratory failure with hypoxia: Secondary | ICD-10-CM | POA: Diagnosis present

## 2019-12-24 DIAGNOSIS — L508 Other urticaria: Secondary | ICD-10-CM

## 2019-12-24 DIAGNOSIS — Z833 Family history of diabetes mellitus: Secondary | ICD-10-CM

## 2019-12-24 DIAGNOSIS — E890 Postprocedural hypothyroidism: Secondary | ICD-10-CM | POA: Diagnosis present

## 2019-12-24 DIAGNOSIS — Z79890 Hormone replacement therapy (postmenopausal): Secondary | ICD-10-CM

## 2019-12-24 DIAGNOSIS — Z7289 Other problems related to lifestyle: Secondary | ICD-10-CM

## 2019-12-24 DIAGNOSIS — I2510 Atherosclerotic heart disease of native coronary artery without angina pectoris: Secondary | ICD-10-CM | POA: Diagnosis present

## 2019-12-24 MED ORDER — AZITHROMYCIN 250 MG PO TABS: ORAL_TABLET | ORAL | 0 refills | Status: DC

## 2019-12-24 MED ORDER — ONDANSETRON 4 MG PO TBDP: 4.0000 mg | ORAL_TABLET | Freq: Three times a day (TID) | ORAL | 1 refills | Status: DC | PRN

## 2019-12-24 MED ORDER — ONDANSETRON HCL 4 MG PO TABS: 4.0000 mg | ORAL_TABLET | Freq: Three times a day (TID) | ORAL | 0 refills | Status: DC | PRN

## 2019-12-24 MED ORDER — HYDROCODONE-HOMATROPINE 5-1.5 MG/5ML PO SYRP: 5.0000 mL | ORAL_SOLUTION | Freq: Three times a day (TID) | ORAL | 0 refills | Status: DC | PRN

## 2019-12-24 NOTE — Telephone Encounter (Signed)
After speaking with Dr Renold Genta scheduled virtual visit.

## 2019-12-24 NOTE — Progress Notes (Addendum)
   Subjective:    Patient ID: Rebecca [name], female    DOB: [birth year], 49 y.o.   MRN: [id number]  HPI 49 year old Female with history of breast cancer and history of chemotherapy related cardiomyopathy now being treated with Entresto by Dr. Nadyne Coombes seen virtually today for positive Covid-19 home test and respiratory infection symptoms.   Patient became ill on  Sunday,  August 22.Now her husband and children all have Covid-19.  They all 4 have positive home tests and they have sent me a picture of the results.  She has been having an  issue with hives now for several months. Due to recurrent hives Was prescribed Xolair  SQ (omalizumab) q 28 days by Dr. Neldon Mc, allergist. Last dose was given August 23.  Hives and urticaria began in January and or not thought to be related to medications.  She has a history of chronic DOE.   Due to  the Coronavirus pandemic, patient is seen by interactive audio and video telecommunications today.    She is identified using 2 identifiers as Rebecca [name].  [name], a patient in this practice.  She is agreeable to visit in this format today.  Review of Systems patient complaining of shortness of breath but her father-in-law brought a pulse oximetry device to their doorstep and she called back and said her pulse ox was 94%.  She looks pale and fatigued.  She is able to give a clear concise history and does not appear to be extremely short of breath.  She is having myalgias and headache.  Has a cough.     Objective:   Physical Exam  She is seen virtually.  She sounds hoarse and congested and has a congested cough.      Assessment & Plan:  History of chemotherapy induced cardiomyopathy treated with Coreg and Entresto followed by Dr. Einar Gip  History of breast cancer.  Essential HTN followed by Dr. Nadyne Coombes  Chronic recurrent urticaria treated with Xolair which is a monoclonal antibody inhibitor prescribed by Dr. Neldon Mc subcutaneously monthly and last dose was  August 23  COVID-19 virus infection -positive home COVID-19 test and symptoms are compatible with COVID-19 infection. Patient called back and said oxygen sat is 94% on room air.  Plan: I have contacted infusion center to see if patient is a candidate for MAB infusion for COVID-19.  Have called in Zithromax Z-PAK with directions 2 p.o. day 1 followed by 1 p.o. days 2 through 5.  She is to monitor her pulse oximetry at home and call if there is a significant decrease.  She is to stay quarantined for 10 days from onset of symptoms which were August 22.  Addendum: Patient admitted with bilateral pneumonia due to Covid-19 on Saturday August 28.  Spoke with husband on the morning of August 28 and he said her O2 sats decreased to 88% in the night.  She will receive dexamethasone and remdesivir.  Time spent reviewing records interviewing patient, advising patient, contacting infusion center, medical decision making and e-scribing medications is 40 minutes.

## 2019-12-24 NOTE — Patient Instructions (Addendum)
Take Zithromax Z pack as directed. We have called Infusion Center and left your information. They will see if you are a candidate for infusion treatment for Covid-19. Hycodan for cough sparingly.Monitor oxygen saturations. Walk in driveway to keep lungs aerated.I will call you tomorrow.  Addendum Patient admitted to [facility] on August 28 with Covid pneumonia.

## 2019-12-24 NOTE — Telephone Encounter (Signed)
[name] [phone]  Rebecca [name] called to say she has been really sick since Sunday, she took home COVID-19 test today and it was positive, fever, 103,100,101, when she takes deep breath she starts coughing, no taste or smell and runny nose. No Vaccine.

## 2019-12-25 ENCOUNTER — Other Ambulatory Visit: Payer: Self-pay | Admitting: Oncology

## 2019-12-25 ENCOUNTER — Encounter: Payer: Self-pay | Admitting: Internal Medicine

## 2019-12-25 ENCOUNTER — Encounter (HOSPITAL_COMMUNITY): Payer: Self-pay | Admitting: Emergency Medicine

## 2019-12-25 ENCOUNTER — Encounter (HOSPITAL_COMMUNITY): Payer: Self-pay | Admitting: Internal Medicine

## 2019-12-25 ENCOUNTER — Emergency Department (HOSPITAL_COMMUNITY): Payer: No Typology Code available for payment source

## 2019-12-25 ENCOUNTER — Inpatient Hospital Stay (HOSPITAL_COMMUNITY)
Admission: EM | Admit: 2019-12-25 | Discharge: 2020-01-04 | DRG: 177 | Disposition: A | Discharge status: Home / Self Care | Payer: No Typology Code available for payment source | Attending: Internal Medicine | Admitting: Internal Medicine

## 2019-12-25 DIAGNOSIS — J069 Acute upper respiratory infection, unspecified: Secondary | ICD-10-CM

## 2019-12-25 DIAGNOSIS — I427 Cardiomyopathy due to drug and external agent: Secondary | ICD-10-CM | POA: Diagnosis present

## 2019-12-25 DIAGNOSIS — I110 Hypertensive heart disease with heart failure: Secondary | ICD-10-CM | POA: Diagnosis present

## 2019-12-25 DIAGNOSIS — J1282 Pneumonia due to coronavirus disease 2019: Secondary | ICD-10-CM | POA: Diagnosis present

## 2019-12-25 DIAGNOSIS — U071 COVID-19: Secondary | ICD-10-CM | POA: Diagnosis not present

## 2019-12-25 DIAGNOSIS — F909 Attention-deficit hyperactivity disorder, unspecified type: Secondary | ICD-10-CM | POA: Diagnosis present

## 2019-12-25 DIAGNOSIS — F419 Anxiety disorder, unspecified: Secondary | ICD-10-CM | POA: Diagnosis present

## 2019-12-25 DIAGNOSIS — R0902 Hypoxemia: Secondary | ICD-10-CM | POA: Diagnosis present

## 2019-12-25 DIAGNOSIS — I2510 Atherosclerotic heart disease of native coronary artery without angina pectoris: Secondary | ICD-10-CM | POA: Diagnosis present

## 2019-12-25 DIAGNOSIS — Z7984 Long term (current) use of oral hypoglycemic drugs: Secondary | ICD-10-CM | POA: Diagnosis not present

## 2019-12-25 DIAGNOSIS — Z7289 Other problems related to lifestyle: Secondary | ICD-10-CM | POA: Diagnosis not present

## 2019-12-25 DIAGNOSIS — E119 Type 2 diabetes mellitus without complications: Secondary | ICD-10-CM | POA: Diagnosis present

## 2019-12-25 DIAGNOSIS — Z79890 Hormone replacement therapy (postmenopausal): Secondary | ICD-10-CM | POA: Diagnosis not present

## 2019-12-25 DIAGNOSIS — Z853 Personal history of malignant neoplasm of breast: Secondary | ICD-10-CM | POA: Diagnosis not present

## 2019-12-25 DIAGNOSIS — E785 Hyperlipidemia, unspecified: Secondary | ICD-10-CM | POA: Diagnosis present

## 2019-12-25 DIAGNOSIS — Z87891 Personal history of nicotine dependence: Secondary | ICD-10-CM | POA: Diagnosis not present

## 2019-12-25 DIAGNOSIS — Z833 Family history of diabetes mellitus: Secondary | ICD-10-CM | POA: Diagnosis not present

## 2019-12-25 DIAGNOSIS — E1169 Type 2 diabetes mellitus with other specified complication: Secondary | ICD-10-CM | POA: Diagnosis present

## 2019-12-25 DIAGNOSIS — E669 Obesity, unspecified: Secondary | ICD-10-CM

## 2019-12-25 DIAGNOSIS — Z79899 Other long term (current) drug therapy: Secondary | ICD-10-CM | POA: Diagnosis not present

## 2019-12-25 DIAGNOSIS — Z9221 Personal history of antineoplastic chemotherapy: Secondary | ICD-10-CM | POA: Diagnosis not present

## 2019-12-25 DIAGNOSIS — K219 Gastro-esophageal reflux disease without esophagitis: Secondary | ICD-10-CM | POA: Diagnosis present

## 2019-12-25 DIAGNOSIS — E1165 Type 2 diabetes mellitus with hyperglycemia: Secondary | ICD-10-CM | POA: Diagnosis present

## 2019-12-25 DIAGNOSIS — E890 Postprocedural hypothyroidism: Secondary | ICD-10-CM | POA: Diagnosis present

## 2019-12-25 DIAGNOSIS — I5023 Acute on chronic systolic (congestive) heart failure: Secondary | ICD-10-CM | POA: Diagnosis not present

## 2019-12-25 DIAGNOSIS — Z9013 Acquired absence of bilateral breasts and nipples: Secondary | ICD-10-CM | POA: Diagnosis not present

## 2019-12-25 DIAGNOSIS — J9601 Acute respiratory failure with hypoxia: Secondary | ICD-10-CM | POA: Diagnosis present

## 2019-12-25 DIAGNOSIS — Z6835 Body mass index (BMI) 35.0-35.9, adult: Secondary | ICD-10-CM | POA: Diagnosis not present

## 2019-12-25 HISTORY — DX: Acute upper respiratory infection, unspecified: J06.9

## 2019-12-25 LAB — CBC WITH DIFFERENTIAL/PLATELET
Abs Immature Granulocytes: 0.01 10*3/uL (ref 0.00–0.07)
Abs Immature Granulocytes: 0.02 10*3/uL (ref 0.00–0.07)
Basophils Absolute: 0 10*3/uL (ref 0.0–0.1)
Basophils Absolute: 0 10*3/uL (ref 0.0–0.1)
Basophils Relative: 0 %
Basophils Relative: 0 %
Eosinophils Absolute: 0 10*3/uL (ref 0.0–0.5)
Eosinophils Absolute: 0 10*3/uL (ref 0.0–0.5)
Eosinophils Relative: 0 %
Eosinophils Relative: 0 %
HCT: 40.9 % (ref 36.0–46.0)
HCT: 38.9 % (ref 36.0–46.0)
Hemoglobin: 12.8 g/dL (ref 12.0–15.0)
Hemoglobin: 12.2 g/dL (ref 12.0–15.0)
Immature Granulocytes: 0 %
Immature Granulocytes: 1 %
Lymphocytes Relative: 21 %
Lymphocytes Relative: 20 %
Lymphs Abs: 0.9 10*3/uL (ref 0.7–4.0)
Lymphs Abs: 0.8 10*3/uL (ref 0.7–4.0)
MCH: 26.9 pg (ref 26.0–34.0)
MCH: 27 pg (ref 26.0–34.0)
MCHC: 31.3 g/dL (ref 30.0–36.0)
MCHC: 31.4 g/dL (ref 30.0–36.0)
MCV: 86.1 fL (ref 80.0–100.0)
MCV: 86.1 fL (ref 80.0–100.0)
Monocytes Absolute: 0.1 10*3/uL (ref 0.1–1.0)
Monocytes Absolute: 0.1 10*3/uL (ref 0.1–1.0)
Monocytes Relative: 3 %
Monocytes Relative: 3 %
Neutro Abs: 3.3 10*3/uL (ref 1.7–7.7)
Neutro Abs: 3.2 10*3/uL (ref 1.7–7.7)
Neutrophils Relative %: 76 %
Neutrophils Relative %: 76 %
Platelets: 203 10*3/uL (ref 150–400)
Platelets: 195 10*3/uL (ref 150–400)
RBC: 4.75 MIL/uL (ref 3.87–5.11)
RBC: 4.52 MIL/uL (ref 3.87–5.11)
RDW: 15.4 % (ref 11.5–15.5)
RDW: 15.5 % (ref 11.5–15.5)
WBC: 4.3 10*3/uL (ref 4.0–10.5)
WBC: 4.1 10*3/uL (ref 4.0–10.5)
nRBC: 0 % (ref 0.0–0.2)
nRBC: 0 % (ref 0.0–0.2)

## 2019-12-25 LAB — CBG MONITORING, ED
Glucose-Capillary: 215 mg/dL — ABNORMAL HIGH (ref 70–99)
Glucose-Capillary: 326 mg/dL — ABNORMAL HIGH (ref 70–99)
Glucose-Capillary: 351 mg/dL — ABNORMAL HIGH (ref 70–99)
Glucose-Capillary: 314 mg/dL — ABNORMAL HIGH (ref 70–99)

## 2019-12-25 LAB — COMPREHENSIVE METABOLIC PANEL
ALT: 25 U/L (ref 0–44)
AST: 24 U/L (ref 15–41)
Albumin: 3.6 g/dL (ref 3.5–5.0)
Alkaline Phosphatase: 46 U/L (ref 38–126)
Anion gap: 13 (ref 5–15)
BUN: 15 mg/dL (ref 6–20)
CO2: 26 mmol/L (ref 22–32)
Calcium: 8.3 mg/dL — ABNORMAL LOW (ref 8.9–10.3)
Chloride: 99 mmol/L (ref 98–111)
Creatinine, Ser: 0.84 mg/dL (ref 0.44–1.00)
GFR calc Af Amer: >60 mL/min (ref >60)
GFR calc non Af Amer: >60 mL/min (ref >60)
Glucose, Bld: 242 mg/dL — ABNORMAL HIGH (ref 70–99)
Potassium: 4 mmol/L (ref 3.5–5.1)
Sodium: 138 mmol/L (ref 135–145)
Total Bilirubin: 0.5 mg/dL (ref 0.3–1.2)
Total Protein: 6.3 g/dL — ABNORMAL LOW (ref 6.5–8.1)

## 2019-12-25 LAB — TROPONIN I (HIGH SENSITIVITY)
Troponin I (High Sensitivity): 11 ng/L (ref <18)
Troponin I (High Sensitivity): 13 ng/L (ref <18)
Troponin I (High Sensitivity): 12 ng/L (ref <18)

## 2019-12-25 LAB — HIV ANTIBODY (ROUTINE TESTING W REFLEX): HIV Screen 4th Generation wRfx: NONREACTIVE

## 2019-12-25 LAB — I-STAT CHEM 8, ED
BUN: 15 mg/dL (ref 6–20)
Calcium, Ion: 1.11 mmol/L — ABNORMAL LOW (ref 1.15–1.40)
Chloride: 99 mmol/L (ref 98–111)
Creatinine, Ser: 0.9 mg/dL (ref 0.44–1.00)
Glucose, Bld: 207 mg/dL — ABNORMAL HIGH (ref 70–99)
HCT: 39 % (ref 36.0–46.0)
Hemoglobin: 13.3 g/dL (ref 12.0–15.0)
Potassium: 4 mmol/L (ref 3.5–5.1)
Sodium: 138 mmol/L (ref 135–145)
TCO2: 32 mmol/L (ref 22–32)

## 2019-12-25 LAB — ABO/RH: ABO/RH(D): A POS

## 2019-12-25 LAB — I-STAT BETA HCG BLOOD, ED (MC, WL, AP ONLY): I-stat hCG, quantitative: <5 m[IU]/mL (ref <5)

## 2019-12-25 LAB — SARS CORONAVIRUS 2 BY RT PCR (HOSPITAL ORDER, PERFORMED IN ~~LOC~~ HOSPITAL LAB): SARS Coronavirus 2: POSITIVE — AB

## 2019-12-25 LAB — C-REACTIVE PROTEIN: CRP: 6.1 mg/dL — ABNORMAL HIGH (ref <1.0)

## 2019-12-25 LAB — HEMOGLOBIN A1C
Hgb A1c MFr Bld: 8 % — ABNORMAL HIGH (ref 4.8–5.6)
Mean Plasma Glucose: 182.9 mg/dL

## 2019-12-25 LAB — D-DIMER, QUANTITATIVE: D-Dimer, Quant: 0.52 ug/mL-FEU — ABNORMAL HIGH (ref 0.00–0.50)

## 2019-12-25 LAB — GLUCOSE, CAPILLARY: Glucose-Capillary: 235 mg/dL — ABNORMAL HIGH (ref 70–99)

## 2019-12-25 LAB — PROCALCITONIN: Procalcitonin: <0.1 ng/mL

## 2019-12-25 MED ORDER — GUAIFENESIN-DM 100-10 MG/5ML PO SYRP
10.0000 mL | ORAL_SOLUTION | ORAL | Status: DC | PRN
  Administered 2019-12-25 – 2020-01-03 (×2): 10 mL via ORAL
  Filled 2019-12-25 (×2): qty 10

## 2019-12-25 MED ORDER — HYDROCOD POLST-CPM POLST ER 10-8 MG/5ML PO SUER
5.0000 mL | Freq: Two times a day (BID) | ORAL | Status: DC | PRN
  Administered 2019-12-27 – 2020-01-03 (×6): 5 mL via ORAL
  Filled 2019-12-25 (×9): qty 5

## 2019-12-25 MED ORDER — ACETAMINOPHEN 325 MG PO TABS
650.0000 mg | ORAL_TABLET | Freq: Once | ORAL | Status: AC
  Administered 2019-12-25: 650 mg via ORAL
  Filled 2019-12-25: qty 2

## 2019-12-25 MED ORDER — INSULIN ASPART 100 UNIT/ML ~~LOC~~ SOLN
0.0000 [IU] | Freq: Three times a day (TID) | SUBCUTANEOUS | Status: DC
  Administered 2019-12-25: 11 [IU] via SUBCUTANEOUS
  Administered 2019-12-25: 5 [IU] via SUBCUTANEOUS
  Administered 2019-12-25: 15 [IU] via SUBCUTANEOUS
  Administered 2019-12-26: 8 [IU] via SUBCUTANEOUS
  Administered 2019-12-26: 5 [IU] via SUBCUTANEOUS
  Administered 2019-12-26: 15 [IU] via SUBCUTANEOUS
  Administered 2019-12-27: 5 [IU] via SUBCUTANEOUS
  Administered 2019-12-27 (×2): 8 [IU] via SUBCUTANEOUS
  Administered 2019-12-28: 15 [IU] via SUBCUTANEOUS
  Administered 2019-12-28: 5 [IU] via SUBCUTANEOUS
  Administered 2019-12-28: 8 [IU] via SUBCUTANEOUS
  Administered 2019-12-29: 15 [IU] via SUBCUTANEOUS
  Administered 2019-12-29: 8 [IU] via SUBCUTANEOUS
  Administered 2019-12-29: 11 [IU] via SUBCUTANEOUS
  Administered 2019-12-30: 3 [IU] via SUBCUTANEOUS
  Administered 2019-12-30 (×2): 11 [IU] via SUBCUTANEOUS
  Administered 2019-12-31: 5 [IU] via SUBCUTANEOUS
  Administered 2019-12-31 (×2): 8 [IU] via SUBCUTANEOUS
  Administered 2020-01-01: 11 [IU] via SUBCUTANEOUS
  Administered 2020-01-01: 5 [IU] via SUBCUTANEOUS
  Administered 2020-01-01: 3 [IU] via SUBCUTANEOUS
  Administered 2020-01-02: 11 [IU] via SUBCUTANEOUS
  Administered 2020-01-02: 3 [IU] via SUBCUTANEOUS
  Administered 2020-01-02: 8 [IU] via SUBCUTANEOUS
  Administered 2020-01-03: 2 [IU] via SUBCUTANEOUS
  Administered 2020-01-03: 3 [IU] via SUBCUTANEOUS
  Administered 2020-01-04: 5 [IU] via SUBCUTANEOUS
  Administered 2020-01-04: 3 [IU] via SUBCUTANEOUS
  Filled 2019-12-25: qty 0.15

## 2019-12-25 MED ORDER — VITAMIN D 25 MCG (1000 UNIT) PO TABS
5000.0000 [IU] | ORAL_TABLET | Freq: Every day | ORAL | Status: DC
  Administered 2019-12-25 – 2020-01-04 (×11): 5000 [IU] via ORAL
  Filled 2019-12-25 (×11): qty 5

## 2019-12-25 MED ORDER — ENOXAPARIN SODIUM 60 MG/0.6ML ~~LOC~~ SOLN
55.0000 mg | SUBCUTANEOUS | Status: DC
  Administered 2019-12-25 – 2019-12-28 (×4): 55 mg via SUBCUTANEOUS
  Filled 2019-12-25 (×2): qty 0.6
  Filled 2019-12-25: qty 0.55
  Filled 2019-12-25: qty 0.6

## 2019-12-25 MED ORDER — ZINC SULFATE 220 (50 ZN) MG PO CAPS
220.0000 mg | ORAL_CAPSULE | Freq: Every day | ORAL | Status: DC
  Administered 2019-12-25 – 2020-01-04 (×11): 220 mg via ORAL
  Filled 2019-12-25 (×11): qty 1

## 2019-12-25 MED ORDER — CARVEDILOL 12.5 MG PO TABS
12.5000 mg | ORAL_TABLET | Freq: Two times a day (BID) | ORAL | Status: DC
  Administered 2019-12-25 – 2019-12-28 (×7): 12.5 mg via ORAL
  Filled 2019-12-25 (×7): qty 1

## 2019-12-25 MED ORDER — ONDANSETRON HCL 4 MG/2ML IJ SOLN: 4.0000 mg | Freq: Four times a day (QID) | INTRAMUSCULAR | Status: DC | PRN

## 2019-12-25 MED ORDER — METHYLPREDNISOLONE SODIUM SUCC 125 MG IJ SOLR
60.0000 mg | Freq: Two times a day (BID) | INTRAMUSCULAR | Status: DC
  Administered 2019-12-25 – 2019-12-27 (×5): 60 mg via INTRAVENOUS
  Filled 2019-12-25 (×5): qty 2

## 2019-12-25 MED ORDER — SODIUM CHLORIDE 0.9 % IV SOLN
100.0000 mg | Freq: Every day | INTRAVENOUS | Status: AC
  Administered 2019-12-26 – 2019-12-29 (×4): 100 mg via INTRAVENOUS
  Filled 2019-12-25 (×4): qty 20

## 2019-12-25 MED ORDER — PANTOPRAZOLE SODIUM 40 MG PO TBEC
40.0000 mg | DELAYED_RELEASE_TABLET | Freq: Every day | ORAL | Status: DC
  Administered 2019-12-25 – 2020-01-04 (×11): 40 mg via ORAL
  Filled 2019-12-25 (×11): qty 1

## 2019-12-25 MED ORDER — VENLAFAXINE HCL ER 75 MG PO CP24
75.0000 mg | ORAL_CAPSULE | Freq: Every day | ORAL | Status: DC
  Administered 2019-12-25 – 2020-01-04 (×11): 75 mg via ORAL
  Filled 2019-12-25 (×11): qty 1

## 2019-12-25 MED ORDER — ONDANSETRON HCL 4 MG PO TABS: 4.0000 mg | ORAL_TABLET | Freq: Four times a day (QID) | ORAL | Status: DC | PRN

## 2019-12-25 MED ORDER — SODIUM CHLORIDE 0.9 % IV SOLN
200.0000 mg | Freq: Once | INTRAVENOUS | Status: AC
  Administered 2019-12-25: 200 mg via INTRAVENOUS
  Filled 2019-12-25: qty 200

## 2019-12-25 MED ORDER — LEVOTHYROXINE SODIUM 112 MCG PO TABS
112.0000 ug | ORAL_TABLET | Freq: Every day | ORAL | Status: DC
  Administered 2019-12-25 – 2020-01-04 (×11): 112 ug via ORAL
  Filled 2019-12-25 (×11): qty 1

## 2019-12-25 MED ORDER — ASCORBIC ACID 500 MG PO TABS
500.0000 mg | ORAL_TABLET | Freq: Every day | ORAL | Status: DC
  Administered 2019-12-25 – 2020-01-04 (×11): 500 mg via ORAL
  Filled 2019-12-25 (×11): qty 1

## 2019-12-25 MED ORDER — INSULIN GLARGINE 100 UNIT/ML ~~LOC~~ SOLN
15.0000 [IU] | Freq: Every day | SUBCUTANEOUS | Status: DC
  Administered 2019-12-25: 15 [IU] via SUBCUTANEOUS
  Filled 2019-12-25 (×2): qty 0.15

## 2019-12-25 NOTE — Progress Notes (Signed)
PROGRESS NOTE  Jaliah Foody Morrill  DOB: 1971/01/12  PCP: Elby Showers, MD GBT:[id number]  DOA: 12/25/2019  LOS: 0 days   Chief Complaint  Patient presents with  . COVID+  . Chest Pain    Brief narrative: Rebecca [name] is a 49 y.o. female with PMH of DM2, HTN, HLD, CAD, breast cancer status post bilateral mastectomy and chemotherapy in remission, chemotherapy-induced cardiomyopathy, hypothyroidism d/t radioactive iodine ablation. Patient presented to the ED on 12/25/2019 with persistent cough, fatigue and weakness, progressively worsening for last 1 week.  She tested positive for COVID-19 as an outpatient in the interval, did not require hospitalization but with progressive symptoms, patient came to the ED on 8/28.  In the ER, patient had temperature of 102.2, heart rate in 90s, respiratory 26, 94% oxygen saturation on room air, blood pressure normal. Initial labs with unremarkable BMP except for blood sugar elevated to 2 7, unremarkable CBC.  D-dimer slightly elevated to 0.52, CRP elevated to 6.1 Repeat COVID-19 PCR positive. Chest x-ray showed mild bilateral infiltrates. Patient was admitted for COVID-19 pneumonia.  Subjective: Patient was seen and examined this morning. Pleasant middle-aged Caucasian female.  Sitting up in bed.  Not in distress.  Feels better than at presentation. Chart reviewed.  No recurrence of fever.  Hemodynamically remained stable, on room air.  Assessment/Plan: COVID pneumonia -Presented with worsening shortness of breath, cough. -COVID test: Patient was due as an outpatient and on admission -Chest imaging: With mild bilateral infiltrates  -Treatment: Patient has been started on 5-day course of IV remdesivir, IV Solu-Medrol at 60 mg twice daily.  Patient is not requiring Actemra or baricitinib but she is agreeable to get if she gets worse. -Protonix while on steroids. -Supportive care: Vitamin C, Zinc, PRN inhalers, Tylenol, Antitussives  (benzonatate/ Mucinex/Tussionex).   -Encouraged incentive spirometry, out of bed and early mobilization as much as possible  -Oxygen - SpO2: 91 % on room air.  Monitor oxygen requirement on ambulation. -Continue airborne/contact isolation precautions for duration of 3 weeks from the day of diagnosis. -WBC and inflammatory markers trend as below.  Lab Results  Component Value Date   SARSCOV2NAA POSITIVE (A) 12/25/2019    Recent Labs  Lab 12/25/19 0125 12/25/19 0515  WBC 4.3 4.1  DDIMER  --  0.52*  CRP  --  6.1*  ALT  --  25   The treatment plan and use of medications and known side effects were discussed with patient/family. Some of the medications used are based on case reports/anecdotal data.  All other medications being used in the management of COVID-19 based on limited study data.  Complete risks and long-term side effects are unknown, however in the best clinical judgment they seem to be of some benefit. Patient wanted to proceed with treatment options provided.  Accelerated hypertension -Patient's blood pressure is running significantly elevated to this morning to 1702/120s. -Resume carvedilol 12.5 mg daily. -Also on IV hydralazine as needed.  History of chemotherapy-induced cardiomyopathy -Last echocardiogram in system from June 2021 showed EF of 40 to 45% which is an improvement from EF of 30 to 35% in March 2021.  -Per patient, she follows up with cardiologist Dr. Einar Gip.  She says Delene Loll was recently stopped.  She has a follow-up due with Dr. Einar Gip in next few weeks.  Diabetes mellitus type 2 -Home meds include Lab Results  Component Value Date   HGBA1C 6.4 (H) 03/19/2019  -On Metformin 500 mg twice daily at home. -Currently on insulin Lantus  15 units daily along with sliding scale insulin. -With IV steroids, expect rise in blood sugar.  Will adjust accordingly.  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  Breast cancer in  remission.  Mobility: Encourage ambulation Code Status:   Code Status: Full Code  Nutritional status: Body mass index is 35.15 kg/m.     Diet Order            Diet heart healthy/carb modified Room service appropriate? Yes; Fluid consistency: Thin  Diet effective now                 DVT prophylaxis: Lovenox subcu  Antimicrobials:  None Fluid: None  Consultants: None Family Communication:  None at bedside  Status is: Inpatient  Remains inpatient appropriate because:Ongoing diagnostic testing needed not appropriate for outpatient work up and IV treatments appropriate due to intensity of illness or inability to take PO   Dispo: The patient is from: Home              Anticipated d/c is to: Home              Anticipated d/c date is: 3 days              Patient currently is not medically stable to d/c.       Infusions:  . [START ON 12/26/2019] remdesivir 100 mg in NS 100 mL      Scheduled Meds: . vitamin C  500 mg Oral Daily  . cholecalciferol  5,000 Units Oral Daily  . enoxaparin (LOVENOX) injection  55 mg Subcutaneous Q24H  . insulin aspart  0-15 Units Subcutaneous TID WC  . insulin glargine  15 Units Subcutaneous Daily  . levothyroxine  112 mcg Oral Daily  . methylPREDNISolone (SOLU-MEDROL) injection  60 mg Intravenous Q12H  . omalizumab  300 mg Subcutaneous Q28 days  . pantoprazole  40 mg Oral Daily  . venlafaxine XR  75 mg Oral Q breakfast  . zinc sulfate  220 mg Oral Daily    Antimicrobials: Anti-infectives (From admission, onward)   Start     Dose/Rate Route Frequency Ordered Stop   12/26/19 1000  remdesivir 100 mg in sodium chloride 0.9 % 100 mL IVPB       "Followed by" Linked Group Details   100 mg 200 mL/hr over 30 Minutes Intravenous Daily 12/25/19 0412 12/30/19 0959   12/25/19 0500  remdesivir 200 mg in sodium chloride 0.9% 250 mL IVPB       "Followed by" Linked Group Details   200 mg 580 mL/hr over 30 Minutes Intravenous Once 12/25/19 0412  12/25/19 0546      PRN meds: chlorpheniramine-HYDROcodone, guaiFENesin-dextromethorphan, ondansetron **OR** ondansetron (ZOFRAN) IV   Objective: Vitals:   12/25/19 0518 12/25/19 0700  BP: 107/72 109/71  Pulse: 83 86  Resp: 17 19  Temp:    SpO2: 94% 91%   No intake or output data in the 24 hours ending 12/25/19 0741 Filed Weights   12/25/19 0037  Weight: 111.1 kg   Weight change:  Body mass index is 35.15 kg/m.   Physical Exam: General exam: Appears calm and comfortable.  Not in physical distress Skin: No rashes, lesions or ulcers. HEENT: Atraumatic, normocephalic, supple neck, no obvious bleeding Lungs: Clear to auscultation bilaterally  CVS: Regular rate and rhythm, no murmur GI/Abd soft, nontender, nondistended, bowel sound present CNS: Alert, awake, oriented x3 Psychiatry: Mood appropriate Extremities: No pedal edema, no calf tenderness  Data Review: I have personally  reviewed the laboratory data and studies available.  Recent Labs  Lab 12/25/19 0125 12/25/19 0136 12/25/19 0515  WBC 4.3  --  4.1  NEUTROABS 3.3  --  3.2  HGB 12.8 13.3 12.2  HCT 40.9 39.0 38.9  MCV 86.1  --  86.1  PLT 203  --  195   Recent Labs  Lab 12/25/19 0136 12/25/19 0515  NA 138 138  K 4.0 4.0  CL 99 99  CO2  --  26  GLUCOSE 207* 242*  BUN 15 15  CREATININE 0.90 0.84  CALCIUM  --  8.3*   Lab Results  Component Value Date   HGBA1C 6.4 (H) 03/19/2019       Component Value Date/Time   CHOL 154 01/27/2019 0820   TRIG 158 (H) 01/27/2019 0820   HDL 43 01/27/2019 0820   CHOLHDL 3.6 01/27/2019 0820   CHOLHDL 3.8 11/30/2018 0921   VLDL 59 (H) 02/17/2018 1045   LDLCALC 84 01/27/2019 0820   LDLCALC 97 11/30/2018 0921   LABVLDL 27 01/27/2019 0820   Signed, Terrilee Croak, MD [facility] Pager: [phone] (Secure Chat preferred). 12/25/2019

## 2019-12-25 NOTE — ED Triage Notes (Signed)
Patient arrives with complaints of chest pain and worsening cough with shortness of breath. Patient states symptoms began on Monday and have continued to worsen. Patient states she began having chest pain today with her cough. Per patient, her PCP was attempting to set her up with mAb infusions but have not gotten a call back. Patient endorses nausea, cough and shortness of breath with chest pain.

## 2019-12-25 NOTE — H&P (Signed)
History and Physical    Cherlynn Popiel Harbach JME:[id number] DOB: 11-Aug-1970 DOA: 12/25/2019  PCP: Elby Showers, MD  Patient coming from: Home.  Chief Complaint: Cough and fatigue.  HPI: Rebecca [name] is a 49 y.o. female with history of diabetes mellitus type 2, breast cancer status post bilateral mastectomy and chemotherapy in remission, chemotherapy-induced cardiomyopathy, hypothyroidism status post radioactive iodine ablation on Synthroid presents to the ER because of persistent cough fatigue and weakness.  Patient states her symptoms started about a week ago when she was not feeling well and over the last 3 days ago patient started feeling pretty weak and started coughing.  This point patient decided to get checked for COVID-19 infection and she was found to be positive.  Plan was to get monoclonal antibodies.  But because her symptoms worsened patient decided to come to the ER.  ED Course: In the ER patient was febrile with temperature of 102 F hypoxic requiring 2 L oxygen chest x-ray showing infiltrates concerning for pneumonia.  Inflammatory markers are pending.  Blood glucose is 207.  Patient admitted for acute respiratory failure with hypoxia secondary to COVID-19 infection.  Review of Systems: As per HPI, rest all negative.   Past Medical History:  Diagnosis Date  . Allergy    constant hives post chemo therapy.  All allergen tests have been neg.  Marland Kitchen Anxiety   . Breast cancer ([place])   . Coronary artery disease   . Diabetes mellitus without complication ([place])   . Diverticulitis   . GERD (gastroesophageal reflux disease)   . Hiatal hernia   . Hyperlipidemia   . Hypertension   . Hypothyroidism   . LBBB (left bundle branch block)     Past Surgical History:  Procedure Laterality Date  . BREAST RECONSTRUCTION WITH PLACEMENT OF TISSUE EXPANDER AND ALLODERM Bilateral 11/18/2017   Procedure: BILATERAL BREAST RECONSTRUCTION WITH PLACEMENT OF TISSUE EXPANDER AND ALLODERM;   Surgeon: Irene Limbo, MD;  Location: [facility];  Service: Plastics;  Laterality: Bilateral;  . CESAREAN SECTION  2007/2010   x2  . COLONOSCOPY  2017  . LAPAROSCOPIC TUBAL LIGATION  06/12/2011   Procedure: LAPAROSCOPIC TUBAL LIGATION;  Surgeon: Daria Pastures, MD;  Location: [facility];  Service: Gynecology;  Laterality: N/A;  filshie clip  . LIPOSUCTION WITH LIPOFILLING Bilateral 02/24/2018   Procedure: LIPOFILLING FROM ABDOMEN TO BILATERAL CHEST;  Surgeon: Irene Limbo, MD;  Location: [facility];  Service: Plastics;  Laterality: Bilateral;  . MASTECTOMY W/ SENTINEL NODE BIOPSY Bilateral 11/18/2017   Procedure: BILATERAL TOTAL MASTECTOMIES WITH RIGHT SENTINEL LYMPH NODE BIOPSY;  Surgeon: Rolm Bookbinder, MD;  Location: [facility];  Service: General;  Laterality: Bilateral;  . mini tuck  2012   abdominal  . PORT-A-CATH REMOVAL Right 01/05/2019  . PORTACATH PLACEMENT Right 07/15/2017   Procedure: INSERTION PORT-A-CATH WITH ULTRASOUND;  Surgeon: Rolm Bookbinder, MD;  Location: [facility];  Service: General;  Laterality: Right;  . REMOVAL OF BILATERAL TISSUE EXPANDERS WITH PLACEMENT OF BILATERAL BREAST IMPLANTS Bilateral 02/24/2018   Procedure: REMOVAL OF BILATERAL TISSUE EXPANDERS WITH PLACEMENT OF BILATERAL BREAST IMPLANTS;  Surgeon: Irene Limbo, MD;  Location: [facility];  Service: Plastics;  Laterality: Bilateral;  . right breast cancer     upper-outer quadrant   . right ear surg  03/2008   Mohs  . TUBAL LIGATION    . US ECHOCARDIOGRAPHY  12-30-2007   EF 55-60%  . WISDOM TOOTH EXTRACTION  reports that she quit smoking about 14 years ago. Her smoking use included cigarettes. She has a 1.50 pack-year smoking history. She has never used smokeless tobacco. She reports current alcohol use. She reports that she does not use drugs.  No Known Allergies  Family History  Problem Relation Age of  Onset  . Lymphoma Father   . Cancer Father        lymphoma  . Diabetes Brother   . Diabetes Mother   . Liver disease Mother        NASH, d. 19  . Asthma Mother   . CAD Maternal Grandmother   . CAD Maternal Grandfather   . Stomach cancer Neg Hx   . Colon cancer Neg Hx   . Colon polyps Neg Hx   . Esophageal cancer Neg Hx   . Rectal cancer Neg Hx     Prior to Admission medications   Medication Sig Start Date End Date Taking? Authorizing Provider  azithromycin (ZITHROMAX) 250 MG tablet Take 2 tablets on day one and take one tablet po on days 2-5 12/24/19   Elby Showers, MD  carvedilol (COREG) 12.5 MG tablet Take 1 tablet (12.5 mg total) by mouth 2 (two) times daily. 07/14/19   Adrian Prows, MD  cetirizine (ZYRTEC) 10 MG tablet Take 10 mg by mouth daily.    [provider]  Cholecalciferol (VITAMIN D3 GUMMIES PO) Take 5,000 Units by mouth daily.    [provider]  diphenhydrAMINE HCl (BENADRYL ALLERGY PO) Take by mouth as needed.    [provider]  ENTRESTO 49-51 MG TAKE 1 TABLET BY MOUTH TWICE A DAY 07/12/19   Adrian Prows, MD  EPINEPHrine 0.3 mg/0.3 mL IJ SOAJ injection Use as directed for life threatening allergic reactions only 07/26/19   Kozlow, Donnamarie Poag, MD  esomeprazole (NEXIUM) 40 MG capsule TAKE 1 CAPSULE (40 MG TOTAL) BY MOUTH DAILY AT 12 NOON. 11/22/19   Nicholas Lose, MD  hydrochlorothiazide (HYDRODIURIL) 12.5 MG tablet Take 12.5 mg by mouth daily. 09/21/19   [provider]  HYDROcodone-homatropine (HYCODAN) 5-1.5 MG/5ML syrup Take 5 mLs by mouth every 8 (eight) hours as needed for cough. 12/24/19   Elby Showers, MD  levothyroxine (SYNTHROID) 112 MCG tablet Take 112 mcg by mouth daily. 06/23/19   [provider]  LORazepam (ATIVAN) 1 MG tablet One po bid prn 05/27/19   Elby Showers, MD  meclizine (ANTIVERT) 12.5 MG tablet meclizine 12.5 mg tablet  TAKE 1 TABLET BY MOUTH 3 TIMES DAILY AS NEEDED FOR DIZZINESS.    [provider]    metFORMIN (GLUCOPHAGE-XR) 500 MG 24 hr tablet Take 500 mg by mouth 2 (two) times daily. 09/21/19   [provider]  montelukast (SINGULAIR) 10 MG tablet TAKE 1 TABLET BY MOUTH EVERYDAY AT BEDTIME 11/15/19   Kennith Gain, MD  Multiple Vitamin (MULTIVITAMIN PO) Take by mouth. Multivitamin + Omega-3    [provider]  ondansetron (ZOFRAN ODT) 4 MG disintegrating tablet Take 1 tablet (4 mg total) by mouth every 8 (eight) hours as needed for nausea or vomiting. 12/24/19   Elby Showers, MD  ondansetron (ZOFRAN) 4 MG tablet Take 1 tablet (4 mg total) by mouth every 8 (eight) hours as needed for nausea or vomiting. 12/24/19   Elby Showers, MD  rosuvastatin (CRESTOR) 20 MG tablet TAKE 1 TABLET (20 MG TOTAL) BY MOUTH DAILY. MUST BE SEEN FOR FURTHER REFILLS 11/24/19   Bensimhon, Shaune Pascal, MD  RYBELSUS 3 MG TABS Take 1 tablet by mouth at bedtime. 09/21/19   [provider]  venlafaxine XR (EFFEXOR-XR) 75 MG 24 hr capsule TAKE 1 CAPSULE (75 MG TOTAL) BY MOUTH DAILY WITH BREAKFAST. 11/24/19   Elby Showers, MD    Physical Exam: Constitutional: Moderately built and nourished. Vitals:   12/25/19 0036 12/25/19 0037  BP: 116/80   Pulse: 95   Resp: (!) 26   Temp: (!) 102.2 F (39 C)   TempSrc: Oral   SpO2: 94%   Weight:  111.1 kg  Height:  5\' 10"  (1.778 m)   Eyes: Anicteric no pallor. ENMT: No discharge from the ears eyes nose or mouth. Neck: No mass felt.  No neck rigidity. Respiratory: No rhonchi or crepitations. Cardiovascular: S1-S2 heard. Abdomen: Soft nontender bowel sounds present. Musculoskeletal: No edema. Skin: No rash. Neurologic: Alert awake oriented to time place and person.  Moves all extremities. Psychiatric: Appears normal.  Normal affect.   Labs on Admission: I have personally reviewed following labs and imaging studies  CBC: Recent Labs  Lab 12/25/19 0125 12/25/19 0136  WBC 4.3  --   NEUTROABS 3.3  --   HGB 12.8 13.3  HCT 40.9 39.0   MCV 86.1  --   PLT 203  --    Basic Metabolic Panel: Recent Labs  Lab 12/25/19 0136  NA 138  K 4.0  CL 99  GLUCOSE 207*  BUN 15  CREATININE 0.90   GFR: Estimated Creatinine Clearance: 100.9 mL/min (by C-G formula based on SCr of 0.9 mg/dL). Liver Function Tests: No results for input(s): AST, ALT, ALKPHOS, BILITOT, PROT, ALBUMIN in the last 168 hours. No results for input(s): LIPASE, AMYLASE in the last 168 hours. No results for input(s): AMMONIA in the last 168 hours. Coagulation Profile: No results for input(s): INR, PROTIME in the last 168 hours. Cardiac Enzymes: No results for input(s): CKTOTAL, CKMB, CKMBINDEX, TROPONINI in the last 168 hours. BNP (last 3 results) No results for input(s): PROBNP in the last 8760 hours. HbA1C: No results for input(s): HGBA1C in the last 72 hours. CBG: No results for input(s): GLUCAP in the last 168 hours. Lipid Profile: No results for input(s): CHOL, HDL, LDLCALC, TRIG, CHOLHDL, LDLDIRECT in the last 72 hours. Thyroid Function Tests: No results for input(s): TSH, T4TOTAL, FREET4, T3FREE, THYROIDAB in the last 72 hours. Anemia Panel: No results for input(s): VITAMINB12, FOLATE, FERRITIN, TIBC, IRON, RETICCTPCT in the last 72 hours. Urine analysis:    Component Value Date/Time   COLORURINE YELLOW 11/25/2011 [place] 11/25/2011 0843   LABSPEC 1.020 11/25/2011 0843   PHURINE 6.0 11/25/2011 0843   GLUCOSEU NEGATIVE 11/25/2011 0843   HGBUR NEGATIVE 11/25/2011 0843   BILIRUBINUR NEG 12/02/2018 1203   KETONESUR NEGATIVE 11/25/2011 0843   PROTEINUR Negative 12/02/2018 1203   PROTEINUR NEGATIVE 11/25/2011 0843   UROBILINOGEN 0.2 12/02/2018 1203   UROBILINOGEN 0.2 11/25/2011 0843   NITRITE NEG 12/02/2018 1203   NITRITE NEGATIVE 11/25/2011 0843   LEUKOCYTESUR Negative 12/02/2018 1203   Sepsis Labs: @LABRCNTIP (procalcitonin:4,lacticidven:4) ) Recent Results (from the past 240 hour(s))  SARS Coronavirus 2 by RT PCR  (hospital order, performed in [facility] hospital lab) Nasopharyngeal Nasopharyngeal Swab     Status: Abnormal   Collection Time: 12/25/19  1:25 AM   Specimen: Nasopharyngeal Swab  Result Value Ref Range Status   SARS Coronavirus 2 POSITIVE (A) NEGATIVE Final    Comment: CRITICAL RESULT CALLED TO, READ BACK BY AND VERIFIED WITH:  NASH,J.,RN @ [id number] 12/25/19 TURNER,S. (NOTE) SARS-CoV-2 target nucleic acids are DETECTED  SARS-CoV-2 RNA is generally detectable in upper respiratory specimens  during the acute phase of infection.  Positive results are indicative  of the presence of the identified virus, but do not rule out bacterial infection or co-infection with other pathogens not detected by the test.  Clinical correlation with patient history and  other diagnostic information is necessary to determine patient infection status.  The expected result is negative.  Fact Sheet for Patients:   StrictlyIdeas.no   Fact Sheet for Healthcare Providers:   BankingDealers.co.za    This test is not yet approved or cleared by the [place] FDA and  has been authorized for detection and/or diagnosis of SARS-CoV-2 by FDA under an Emergency Use Authorization (EUA).  This EUA will remain in effect (meani ng this test can be used) for the duration of  the COVID-19 declaration under Section 564(b)(1) of the Act, 21 U.S.C. section 360-bbb-3(b)(1), unless the authorization is terminated or revoked sooner.  Performed at [facility], [place] [address] [place], [place] [address]      Radiological Exams on Admission: DG Chest Portable 1 View  Result Date: 12/25/2019 CLINICAL DATA:  Chest pain COVID EXAM: PORTABLE CHEST 1 VIEW COMPARISON:  07/15/2017 FINDINGS: Mild cardiomegaly with central vascular congestion. Streaky bilateral left greater than right opacities. No pleural effusion or pneumothorax IMPRESSION: 1. Streaky left greater than  right lung opacities, probably reflecting bilateral pneumonia 2. Cardiomegaly with mild central congestion Electronically Signed   By: Donavan Foil M.D.   On: 12/25/2019 01:22    EKG: Independently reviewed.  Sinus tachycardia LBBB.  Assessment/Plan Principal Problem:   Acute respiratory disease due to COVID-19 virus Active Problems:   Diabetes mellitus type 2 in obese ([place])    1. Acute respiratory failure with hypoxia secondary to COVID-19 infection for which I have started patient on IV remdesivir and Solu-Medrol.  Presently patient is on 2 L oxygen inflammatory markers are pending.  I have discussed with patient about off label use of Actemra/Bircatinib with side effects and contraindication patient agrees to get it if needed. 2. Diabetes mellitus type 2 with hyperglycemia we will check hemoglobin A1c we will keep patient on Lantus at this time given that patient has hyperglycemia and also is going to use Solu-Medrol.  Moderate dose sliding scale has been ordered. 3. History of chemotherapy-induced cardiomyopathy patient has not been using her cardiac medication and at this time does not want to be placed on it.  Closely monitor respiratory status. 4. History of hyperthyroidism status post radioactive iodine ablation on Synthroid. 5. Breast cancer in remission.  Since patient has acute respiratory failure with hypoxia secondary to COVID-19 infection will need close monitoring for any further worsening in inpatient status.   DVT prophylaxis: Lovenox. Code Status: Full code. Family Communication: Discussed with patient. Disposition Plan: Home. Consults called: None. Admission status: Inpatient.   Rise Patience MD [facility] Pager [phone].  If 7PM-7AM, please contact night-coverage [email or web address] Password [email or web address]  12/25/2019, 4:09 AM

## 2019-12-25 NOTE — ED Provider Notes (Signed)
[facility] DEPT Provider Note   CSN: [id number] Arrival date & time: 12/24/19  2358     History Chief Complaint  Patient presents with  . COVID+  . Chest Pain    Rebecca [name] is a 49 y.o. female.  Patient presents to the emergency department with a chief complaint of cough and shortness of breath.  She states she tested positive for Covid with a home test.  She reports fever to 102.  She is noted to be febrile to 102.2 here.  She states that she feels winded and worn out when she walks.  She reports productive cough.  She states that she has been feeling sick for the past couple of days.  The history is provided by the patient. No language interpreter was used.       Past Medical History:  Diagnosis Date  . Allergy    constant hives post chemo therapy.  All allergen tests have been neg.  Marland Kitchen Anxiety   . Breast cancer ([place])   . Coronary artery disease   . Diabetes mellitus without complication ([place])   . Diverticulitis   . GERD (gastroesophageal reflux disease)   . Hiatal hernia   . Hyperlipidemia   . Hypertension   . Hypothyroidism   . LBBB (left bundle branch block)     Patient Active Problem List   Diagnosis Date Noted  . Abdominal pain, epigastric 10/01/2019  . Breast cancer, right ([place]) 11/18/2017  . Genetic testing 08/19/2017  . Port-A-Cath in place 08/08/2017  . Malignant neoplasm of upper-outer quadrant of right breast in female, estrogen receptor negative ([place]) 07/14/2017  . Dependent edema 10/23/2015  . Episodic paroxysmal anxiety disorder 10/23/2015  . Attention deficit disorder 10/23/2015  . Low TSH level 10/23/2015  . Obesity 10/23/2015  . History of abdominoplasty 10/23/2015  . Chest pain 09/09/2011    Past Surgical History:  Procedure Laterality Date  . BREAST RECONSTRUCTION WITH PLACEMENT OF TISSUE EXPANDER AND ALLODERM Bilateral 11/18/2017   Procedure: BILATERAL BREAST RECONSTRUCTION WITH PLACEMENT OF  TISSUE EXPANDER AND ALLODERM;  Surgeon: Irene Limbo, MD;  Location: [facility];  Service: Plastics;  Laterality: Bilateral;  . CESAREAN SECTION  2007/2010   x2  . COLONOSCOPY  2017  . LAPAROSCOPIC TUBAL LIGATION  06/12/2011   Procedure: LAPAROSCOPIC TUBAL LIGATION;  Surgeon: Daria Pastures, MD;  Location: [facility];  Service: Gynecology;  Laterality: N/A;  filshie clip  . LIPOSUCTION WITH LIPOFILLING Bilateral 02/24/2018   Procedure: LIPOFILLING FROM ABDOMEN TO BILATERAL CHEST;  Surgeon: Irene Limbo, MD;  Location: [facility];  Service: Plastics;  Laterality: Bilateral;  . MASTECTOMY W/ SENTINEL NODE BIOPSY Bilateral 11/18/2017   Procedure: BILATERAL TOTAL MASTECTOMIES WITH RIGHT SENTINEL LYMPH NODE BIOPSY;  Surgeon: Rolm Bookbinder, MD;  Location: [facility];  Service: General;  Laterality: Bilateral;  . mini tuck  2012   abdominal  . PORT-A-CATH REMOVAL Right 01/05/2019  . PORTACATH PLACEMENT Right 07/15/2017   Procedure: INSERTION PORT-A-CATH WITH ULTRASOUND;  Surgeon: Rolm Bookbinder, MD;  Location: [facility];  Service: General;  Laterality: Right;  . REMOVAL OF BILATERAL TISSUE EXPANDERS WITH PLACEMENT OF BILATERAL BREAST IMPLANTS Bilateral 02/24/2018   Procedure: REMOVAL OF BILATERAL TISSUE EXPANDERS WITH PLACEMENT OF BILATERAL BREAST IMPLANTS;  Surgeon: Irene Limbo, MD;  Location: [facility];  Service: Plastics;  Laterality: Bilateral;  . right breast cancer     upper-outer quadrant   . right ear surg  03/2008   Mohs  . TUBAL LIGATION    . US ECHOCARDIOGRAPHY  12-30-2007   EF 55-60%  . WISDOM TOOTH EXTRACTION       OB History   No obstetric history on file.     Family History  Problem Relation Age of Onset  . Lymphoma Father   . Cancer Father        lymphoma  . Diabetes Brother   . Diabetes Mother   . Liver disease Mother        NASH, d. 53  . Asthma Mother   . CAD Maternal  Grandmother   . CAD Maternal Grandfather   . Stomach cancer Neg Hx   . Colon cancer Neg Hx   . Colon polyps Neg Hx   . Esophageal cancer Neg Hx   . Rectal cancer Neg Hx     Social History   Tobacco Use  . Smoking status: Former Smoker    Packs/day: 0.25    Years: 6.00    Pack years: 1.50    Types: Cigarettes    Quit date: 03/29/2005    Years since quitting: 14.7  . Smokeless tobacco: Never Used  Vaping Use  . Vaping Use: Never used  Substance Use Topics  . Alcohol use: Yes    Comment: socially  . Drug use: No    Home Medications Prior to Admission medications   Medication Sig Start Date End Date Taking? Authorizing Provider  azithromycin (ZITHROMAX) 250 MG tablet Take 2 tablets on day one and take one tablet po on days 2-5 12/24/19   Elby Showers, MD  carvedilol (COREG) 12.5 MG tablet Take 1 tablet (12.5 mg total) by mouth 2 (two) times daily. 07/14/19   Adrian Prows, MD  cetirizine (ZYRTEC) 10 MG tablet Take 10 mg by mouth daily.    [provider]  Cholecalciferol (VITAMIN D3 GUMMIES PO) Take 5,000 Units by mouth daily.    [provider]  diphenhydrAMINE HCl (BENADRYL ALLERGY PO) Take by mouth as needed.    [provider]  ENTRESTO 49-51 MG TAKE 1 TABLET BY MOUTH TWICE A DAY 07/12/19   Adrian Prows, MD  EPINEPHrine 0.3 mg/0.3 mL IJ SOAJ injection Use as directed for life threatening allergic reactions only 07/26/19   Kozlow, Donnamarie Poag, MD  esomeprazole (NEXIUM) 40 MG capsule TAKE 1 CAPSULE (40 MG TOTAL) BY MOUTH DAILY AT 12 NOON. 11/22/19   Nicholas Lose, MD  hydrochlorothiazide (HYDRODIURIL) 12.5 MG tablet Take 12.5 mg by mouth daily. 09/21/19   [provider]  HYDROcodone-homatropine (HYCODAN) 5-1.5 MG/5ML syrup Take 5 mLs by mouth every 8 (eight) hours as needed for cough. 12/24/19   Elby Showers, MD  levothyroxine (SYNTHROID) 112 MCG tablet Take 112 mcg by mouth daily. 06/23/19   [provider]  LORazepam (ATIVAN) 1 MG tablet One po  bid prn 05/27/19   Elby Showers, MD  meclizine (ANTIVERT) 12.5 MG tablet meclizine 12.5 mg tablet  TAKE 1 TABLET BY MOUTH 3 TIMES DAILY AS NEEDED FOR DIZZINESS.    [provider]  metFORMIN (GLUCOPHAGE-XR) 500 MG 24 hr tablet Take 500 mg by mouth 2 (two) times daily. 09/21/19   [provider]  montelukast (SINGULAIR) 10 MG tablet TAKE 1 TABLET BY MOUTH EVERYDAY AT BEDTIME 11/15/19   Kennith Gain, MD  Multiple Vitamin (MULTIVITAMIN PO) Take by mouth. Multivitamin + Omega-3    [provider]  ondansetron (ZOFRAN ODT) 4 MG disintegrating tablet Take 1  tablet (4 mg total) by mouth every 8 (eight) hours as needed for nausea or vomiting. 12/24/19   Elby Showers, MD  ondansetron (ZOFRAN) 4 MG tablet Take 1 tablet (4 mg total) by mouth every 8 (eight) hours as needed for nausea or vomiting. 12/24/19   Elby Showers, MD  rosuvastatin (CRESTOR) 20 MG tablet TAKE 1 TABLET (20 MG TOTAL) BY MOUTH DAILY. MUST BE SEEN FOR FURTHER REFILLS 11/24/19   Bensimhon, Shaune Pascal, MD  RYBELSUS 3 MG TABS Take 1 tablet by mouth at bedtime. 09/21/19   [provider]  venlafaxine XR (EFFEXOR-XR) 75 MG 24 hr capsule TAKE 1 CAPSULE (75 MG TOTAL) BY MOUTH DAILY WITH BREAKFAST. 11/24/19   Baxley, Cresenciano Lick, MD    Allergies    Patient has no known allergies.  Review of Systems   Review of Systems  All other systems reviewed and are negative.   Physical Exam Updated Vital Signs BP 116/80 (BP Location: Left Arm)   Pulse 95   Temp (!) 102.2 F (39 C) (Oral)   Resp (!) 26   Ht 5\' 10"  (1.778 m)   Wt 111.1 kg   SpO2 94%   BMI 35.15 kg/m   Physical Exam Vitals and nursing note reviewed.  Constitutional:      General: She is not in acute distress.    Appearance: She is well-developed.  HENT:     Head: Normocephalic and atraumatic.  Eyes:     Conjunctiva/sclera: Conjunctivae normal.  Cardiovascular:     Rate and Rhythm: Normal rate and regular rhythm.     Heart sounds:  No murmur heard.   Pulmonary:     Effort: Pulmonary effort is normal. No respiratory distress.     Breath sounds: Normal breath sounds.  Abdominal:     Palpations: Abdomen is soft.     Tenderness: There is no abdominal tenderness.  Musculoskeletal:        General: Normal range of motion.     Cervical back: Neck supple.  Skin:    General: Skin is warm and dry.  Neurological:     Mental Status: She is alert and oriented to person, place, and time.  Psychiatric:        Mood and Affect: Mood normal.        Behavior: Behavior normal.     ED Results / Procedures / Treatments   Labs (all labs ordered are listed, but only abnormal results are displayed) Labs Reviewed  I-STAT CHEM 8, ED - Abnormal; Notable for the following components:      Result Value   Glucose, Bld 207 (*)    Calcium, Ion 1.11 (*)    All other components within normal limits  SARS CORONAVIRUS 2 BY RT PCR (HOSPITAL ORDER, [facility])  CBC WITH DIFFERENTIAL/PLATELET  I-STAT BETA HCG BLOOD, ED ([facility], [facility], [facility] ONLY)  TROPONIN I (HIGH SENSITIVITY)  TROPONIN I (HIGH SENSITIVITY)    EKG None  Radiology DG Chest Portable 1 View  Result Date: 12/25/2019 CLINICAL DATA:  Chest pain COVID EXAM: PORTABLE CHEST 1 VIEW COMPARISON:  07/15/2017 FINDINGS: Mild cardiomegaly with central vascular congestion. Streaky bilateral left greater than right opacities. No pleural effusion or pneumothorax IMPRESSION: 1. Streaky left greater than right lung opacities, probably reflecting bilateral pneumonia 2. Cardiomegaly with mild central congestion Electronically Signed   By: Donavan Foil M.D.   On: 12/25/2019 01:22    Procedures .Critical Care Performed by: Montine Circle, PA-C Authorized  by: Montine Circle, PA-C   Critical care provider statement:    Critical care time (minutes):  45   Critical care was necessary to treat or prevent imminent or life-threatening deterioration of the following  conditions:  Respiratory failure   Critical care was time spent personally by me on the following activities:  Discussions with consultants, evaluation of patient's response to treatment, examination of patient, ordering and performing treatments and interventions, ordering and review of laboratory studies, ordering and review of radiographic studies, pulse oximetry, re-evaluation of patient's condition, obtaining history from patient or surrogate and review of old charts   (including critical care time)  Medications Ordered in ED Medications  acetaminophen (TYLENOL) tablet 650 mg (has no administration in time range)    ED Course  I have reviewed the triage vital signs and the nursing notes.  Pertinent labs & imaging results that were available during my care of the patient were reviewed by me and considered in my medical decision making (see chart for details).    MDM Rules/Calculators/A&P                          This patient complains of cough and covid, this involves an extensive number of treatment options, and is a complaint that carries with it a high risk of complications and morbidity.    Pertinent Labs I ordered, reviewed, and interpreted labs, which included CBC, chem 8, and troponin which are reassuring.  Imaging Interpretation I ordered imaging studies which included CXR, which showed bilateral pneumonia.   Medications I ordered medication tylenol for fever.  Sources Previous records obtained and reviewed and show advice from PCP to be evaluated for MAB, unfortunately due to her hypoxia, I think she will require admission.  Consultants Dr. Hal Hope - Appreciated for admitting.  Plan Admit     [place] was evaluated in Emergency Department on 12/25/2019 for the symptoms described in the history of present illness. She was evaluated in the context of the global COVID-19 pandemic, which necessitated consideration that the patient might be at risk for  infection with the SARS-CoV-2 virus that causes COVID-19. Institutional protocols and algorithms that pertain to the evaluation of patients at risk for COVID-19 are in a state of rapid change based on information released by regulatory bodies including the CDC and federal and state organizations. These policies and algorithms were followed during the patient's care in the ED.   Final Clinical Impression(s) / ED Diagnoses Final diagnoses:  COVID-19  Hypoxia    Rx / DC Orders ED Discharge Orders    None       Delaine Lame 12/25/19 [place] Charon, April, MD 12/25/19 [id number]

## 2019-12-25 NOTE — ED Notes (Signed)
Dr. Pietro Cassis aware pt BP 177/128.

## 2019-12-25 NOTE — ED Notes (Addendum)
Patient has result on her phone from at home rapid showing positive

## 2019-12-26 ENCOUNTER — Other Ambulatory Visit: Payer: Self-pay

## 2019-12-26 LAB — BASIC METABOLIC PANEL
Anion gap: 13 (ref 5–15)
BUN: 20 mg/dL (ref 6–20)
CO2: 25 mmol/L (ref 22–32)
Calcium: 8.8 mg/dL — ABNORMAL LOW (ref 8.9–10.3)
Chloride: 98 mmol/L (ref 98–111)
Creatinine, Ser: 0.82 mg/dL (ref 0.44–1.00)
GFR calc Af Amer: >60 mL/min (ref >60)
GFR calc non Af Amer: >60 mL/min (ref >60)
Glucose, Bld: 369 mg/dL — ABNORMAL HIGH (ref 70–99)
Potassium: 4.3 mmol/L (ref 3.5–5.1)
Sodium: 136 mmol/L (ref 135–145)

## 2019-12-26 LAB — C-REACTIVE PROTEIN: CRP: 6.4 mg/dL — ABNORMAL HIGH (ref <1.0)

## 2019-12-26 LAB — GLUCOSE, CAPILLARY
Glucose-Capillary: 290 mg/dL — ABNORMAL HIGH (ref 70–99)
Glucose-Capillary: 382 mg/dL — ABNORMAL HIGH (ref 70–99)
Glucose-Capillary: 244 mg/dL — ABNORMAL HIGH (ref 70–99)
Glucose-Capillary: 253 mg/dL — ABNORMAL HIGH (ref 70–99)

## 2019-12-26 MED ORDER — ACETAMINOPHEN 325 MG PO TABS
650.0000 mg | ORAL_TABLET | Freq: Four times a day (QID) | ORAL | Status: DC | PRN
  Administered 2019-12-26: 650 mg via ORAL
  Filled 2019-12-26: qty 2

## 2019-12-26 MED ORDER — SODIUM CHLORIDE 0.9 % IV SOLN
INTRAVENOUS | Status: DC | PRN
  Administered 2019-12-26: 500 mL via INTRAVENOUS

## 2019-12-26 MED ORDER — INSULIN GLARGINE 100 UNIT/ML ~~LOC~~ SOLN
25.0000 [IU] | Freq: Every day | SUBCUTANEOUS | Status: DC
  Administered 2019-12-26 – 2019-12-27 (×2): 25 [IU] via SUBCUTANEOUS
  Filled 2019-12-26 (×3): qty 0.25

## 2019-12-26 NOTE — Progress Notes (Signed)
PROGRESS NOTE  Atiya Yera [name]  DOB: 06/27/1970  PCP: Elby Showers, MD WJX:[id number]  DOA: 12/25/2019  LOS: 1 day   Chief Complaint  Patient presents with  . COVID+  . Chest Pain    Brief narrative: Rebecca [name] is a 49 y.o. female with PMH of DM2, HTN, HLD, CAD, breast cancer status post bilateral mastectomy and chemotherapy in remission, chemotherapy-induced cardiomyopathy, hypothyroidism d/t radioactive iodine ablation. Patient presented to the ED on 12/25/2019 with persistent cough, fatigue and weakness, progressively worsening for last 1 week.  She tested positive for COVID-19 as an outpatient in the interval, did not require hospitalization but with progressive symptoms, patient came to the ED on 8/28.  In the ER, patient had temperature of 102.2, heart rate in 90s, respiratory 26, 94% oxygen saturation on room air, blood pressure normal. Initial labs with unremarkable BMP except for blood sugar elevated to 2 7, unremarkable CBC.  D-dimer slightly elevated to 0.52, CRP elevated to 6.1 Repeat COVID-19 PCR positive. Chest x-ray showed mild bilateral infiltrates. Patient was admitted for COVID-19 pneumonia.  Subjective: Patient was seen and examined this morning.  Lying on bed.  Not in distress.  On 4 L oxygen by nasal cannula at rest.  She states however she was able to walk to the bathroom without supplemental oxygen.  Glucose elevated to 290 this morning.  Hemoglobin A1c 8.  Assessment/Plan: COVID pneumonia -Presented with worsening shortness of breath, cough. -COVID test: Patient was due as an outpatient and on admission -Chest imaging: With mild bilateral infiltrates  -Treatment: Currently on 5-day course of IV remdesivir, IV Solu-Medrol at 60 mg twice daily. Patient is not requiring Actemra or baricitinib but she is agreeable to get if she gets worse. -On 4 L oxygen by nasal cannula at rest.  She states however she was able to walk to the bathroom without  supplemental oxygen. Monitor oxygen requirement on ambulation.  Progression: Feels better.  She thinks he can do better on low-flow oxygen as well.  Continue to wean down oxygen.  CRP remains elevated however at 6.4 this morning. -Continue remdesivir and Solu-Medrol. -Protonix while on steroids. -Supportive care: Vitamin C, Zinc, PRN inhalers, Tylenol, Antitussives (benzonatate/ Mucinex/Tussionex).   -Encouraged incentive spirometry, out of bed and early mobilization as much as possible -Continue airborne/contact isolation precautions for duration of 3 weeks from the day of diagnosis. -WBC and inflammatory markers trend as below.  Lab Results  Component Value Date   SARSCOV2NAA POSITIVE (A) 12/25/2019    Recent Labs  Lab 12/25/19 0125 12/25/19 0515 12/26/19 1044  WBC 4.3 4.1  --   PROCALCITON  --  <0.10  --   DDIMER  --  0.52*  --   CRP  --  6.1* 6.4*  ALT  --  25  --    The treatment plan and use of medications and known side effects were discussed with patient/family. Some of the medications used are based on case reports/anecdotal data.  All other medications being used in the management of COVID-19 based on limited study data.  Complete risks and long-term side effects are unknown, however in the best clinical judgment they seem to be of some benefit. Patient wanted to proceed with treatment options provided.  Accelerated hypertension -Patient's blood pressure was significantly elevated on admission up to 170s. -Improved after Coreg was resumed.  Currently in normal range. -Also on IV hydralazine as needed.  History of chemotherapy-induced cardiomyopathy -Last echocardiogram in system from June 2021 showed  EF of 40 to 45% which is an improvement from EF of 30 to 35% in March 2021.  -Per patient, she follows up with cardiologist Dr. Einar Gip.  She says Delene Loll was recently stopped.  She has a follow-up due with Dr. Einar Gip in next few weeks.  Diabetes mellitus type 2 -Home meds  include Lab Results  Component Value Date   HGBA1C 8.0 (H) 12/25/2019  -On Metformin 500 mg twice daily at home. -Currently on insulin Lantus 15 units daily along with sliding scale insulin. -With IV steroids, blood sugar level is rising up.  I increased her Lantus dose to 25 units today.    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  Breast cancer in remission.  Mobility: Encourage ambulation Code Status:   Code Status: Full Code  Nutritional status: Body mass index is 35.15 kg/m.     Diet Order            Diet heart healthy/carb modified Room service appropriate? Yes; Fluid consistency: Thin  Diet effective now                 DVT prophylaxis: Lovenox subcu  Antimicrobials:  None Fluid: None  Consultants: None Family Communication:  None at bedside  Status is: Inpatient  Remains inpatient appropriate because:Ongoing diagnostic testing needed not appropriate for outpatient work up and IV treatments appropriate due to intensity of illness or inability to take PO   Dispo: The patient is from: Home              Anticipated d/c is to: Home              Anticipated d/c date is: 3 days              Patient currently is not medically stable to d/c.  Infusions:  . sodium chloride Stopped (12/26/19 1215)  . remdesivir 100 mg in NS 100 mL 100 mg (12/26/19 1056)    Scheduled Meds: . vitamin C  500 mg Oral Daily  . carvedilol  12.5 mg Oral BID  . cholecalciferol  5,000 Units Oral Daily  . enoxaparin (LOVENOX) injection  55 mg Subcutaneous Q24H  . insulin aspart  0-15 Units Subcutaneous TID WC  . insulin glargine  25 Units Subcutaneous Daily  . levothyroxine  112 mcg Oral Daily  . methylPREDNISolone (SOLU-MEDROL) injection  60 mg Intravenous Q12H  . pantoprazole  40 mg Oral Daily  . venlafaxine XR  75 mg Oral Q breakfast  . zinc sulfate  220 mg Oral Daily    Antimicrobials: Anti-infectives (From admission, onward)   Start     Dose/Rate  Route Frequency Ordered Stop   12/26/19 1000  remdesivir 100 mg in sodium chloride 0.9 % 100 mL IVPB       "Followed by" Linked Group Details   100 mg 200 mL/hr over 30 Minutes Intravenous Daily 12/25/19 0412 12/30/19 0959   12/25/19 0500  remdesivir 200 mg in sodium chloride 0.9% 250 mL IVPB       "Followed by" Linked Group Details   200 mg 580 mL/hr over 30 Minutes Intravenous Once 12/25/19 0412 12/25/19 0915      PRN meds: sodium chloride, chlorpheniramine-HYDROcodone, guaiFENesin-dextromethorphan, ondansetron **OR** ondansetron (ZOFRAN) IV   Objective: Vitals:   12/26/19 0903 12/26/19 1315  BP: 115/75 106/68  Pulse: 75 71  Resp: 14 20  Temp: 97.6 F (36.4 C) 98.8 F (37.1 C)  SpO2: (!) 89% (!) 84%    Intake/Output Summary (  Last 24 hours) at 12/26/2019 1438 Last data filed at 12/25/2019 2304 Gross per 24 hour  Intake 60 ml  Output --  Net 60 ml   Filed Weights   12/25/19 0037  Weight: 111.1 kg   Weight change:  Body mass index is 35.15 kg/m.   Physical Exam: General exam: Appears calm and comfortable.  Not in physical distress Skin: No rashes, lesions or ulcers. HEENT: Atraumatic, normocephalic, supple neck, no obvious bleeding Lungs: Clear to auscultation bilaterally CVS: Regular rate and rhythm, no murmur GI/Abd soft, nontender, nondistended, bowel sound present CNS: Alert, awake, oriented x3 Psychiatry: Mood appropriate Extremities: No pedal edema, no calf tenderness  Data Review: I have personally reviewed the laboratory data and studies available.  Recent Labs  Lab 12/25/19 0125 12/25/19 0136 12/25/19 0515  WBC 4.3  --  4.1  NEUTROABS 3.3  --  3.2  HGB 12.8 13.3 12.2  HCT 40.9 39.0 38.9  MCV 86.1  --  86.1  PLT 203  --  195   Recent Labs  Lab 12/25/19 0136 12/25/19 0515 12/26/19 1044  NA 138 138 136  K 4.0 4.0 4.3  CL 99 99 98  CO2  --  26 25  GLUCOSE 207* 242* 369*  BUN 15 15 20   CREATININE 0.90 0.84 0.82  CALCIUM  --  8.3* 8.8*    Lab Results  Component Value Date   HGBA1C 8.0 (H) 12/25/2019       Component Value Date/Time   CHOL 154 01/27/2019 0820   TRIG 158 (H) 01/27/2019 0820   HDL 43 01/27/2019 0820   CHOLHDL 3.6 01/27/2019 0820   CHOLHDL 3.8 11/30/2018 0921   VLDL 59 (H) 02/17/2018 1045   LDLCALC 84 01/27/2019 0820   LDLCALC 97 11/30/2018 0921   LABVLDL 27 01/27/2019 0820   Signed, Terrilee Croak, MD [facility] Pager: [phone] (Secure Chat preferred). 12/26/2019

## 2019-12-26 NOTE — Progress Notes (Signed)
Complaints of headache. Paged Jeannette Corpus to request Tylenol.

## 2019-12-26 NOTE — Plan of Care (Signed)
  Problem: Education: Goal: Knowledge of General Education information will improve Description Including pain rating scale, medication(s)/side effects and non-pharmacologic comfort measures Outcome: Progressing   Problem: Health Behavior/Discharge Planning: Goal: Ability to manage health-related needs will improve Outcome: Progressing   

## 2019-12-27 LAB — GLUCOSE, CAPILLARY
Glucose-Capillary: 274 mg/dL — ABNORMAL HIGH (ref 70–99)
Glucose-Capillary: 291 mg/dL — ABNORMAL HIGH (ref 70–99)
Glucose-Capillary: 216 mg/dL — ABNORMAL HIGH (ref 70–99)
Glucose-Capillary: 192 mg/dL — ABNORMAL HIGH (ref 70–99)

## 2019-12-27 LAB — BASIC METABOLIC PANEL
Anion gap: 11 (ref 5–15)
BUN: 24 mg/dL — ABNORMAL HIGH (ref 6–20)
CO2: 25 mmol/L (ref 22–32)
Calcium: 8.6 mg/dL — ABNORMAL LOW (ref 8.9–10.3)
Chloride: 101 mmol/L (ref 98–111)
Creatinine, Ser: 0.76 mg/dL (ref 0.44–1.00)
GFR calc Af Amer: >60 mL/min (ref >60)
GFR calc non Af Amer: >60 mL/min (ref >60)
Glucose, Bld: 264 mg/dL — ABNORMAL HIGH (ref 70–99)
Potassium: 4.4 mmol/L (ref 3.5–5.1)
Sodium: 137 mmol/L (ref 135–145)

## 2019-12-27 LAB — FERRITIN: Ferritin: 233 ng/mL (ref 11–307)

## 2019-12-27 LAB — C-REACTIVE PROTEIN: CRP: 2.6 mg/dL — ABNORMAL HIGH (ref <1.0)

## 2019-12-27 LAB — D-DIMER, QUANTITATIVE: D-Dimer, Quant: 0.4 ug/mL-FEU (ref 0.00–0.50)

## 2019-12-27 MED ORDER — INSULIN ASPART 100 UNIT/ML ~~LOC~~ SOLN
5.0000 [IU] | Freq: Three times a day (TID) | SUBCUTANEOUS | Status: DC
  Administered 2019-12-27: 5 [IU] via SUBCUTANEOUS

## 2019-12-27 MED ORDER — LORAZEPAM 1 MG PO TABS
1.0000 mg | ORAL_TABLET | Freq: Two times a day (BID) | ORAL | Status: AC
  Administered 2019-12-27: 1 mg via ORAL
  Filled 2019-12-27: qty 1

## 2019-12-27 MED ORDER — METHYLPREDNISOLONE SODIUM SUCC 125 MG IJ SOLR
60.0000 mg | Freq: Four times a day (QID) | INTRAMUSCULAR | Status: DC
  Administered 2019-12-27 – 2019-12-29 (×7): 60 mg via INTRAVENOUS
  Filled 2019-12-27 (×7): qty 2

## 2019-12-27 NOTE — Progress Notes (Signed)
O2 sats down 83-84% on 15L NRB, Secure chat with Ardith Dark NP suggested HFNC with NRB.  Changing over to 15 L HFNC pt sats initially up to 89% but then down to 83%, Pt is drowsy from Ativan 1mg  po but stated the sleep is what she needs so badly as she was so tired. Woke her up using IS & sats up to89%.  RT came & added HFNC to the NRB.

## 2019-12-27 NOTE — Progress Notes (Signed)
Pt was requesting a ill for her anxiety, upon further evaluation of pt,  I monitored her O2 on 4lnc for 62mins with O2 sats at 79-82% prior to making an increase to [place]. Very little increase sats. Got up to use BSC and sats dropped below 70% for several minutes with pt looking pale & c/o feeling like she was gonna pass out. Refocused breathing but after a few mins of no improvement I opted to place pt on NRB. O2 sats came up to 91-93%. Gave her 1 x dose of Lorazepam 1mg . Pt verbalizing she feels so much better and more  Comfortable with the mask. Will cont to monitor

## 2019-12-27 NOTE — Progress Notes (Signed)
PROGRESS NOTE  Gracen Ringwald Dockendorf  DOB: 1971-02-05  PCP: Elby Showers, MD GUY:[id number]  DOA: 12/25/2019  LOS: 2 days   Chief Complaint  Patient presents with  . Covid Exposure  . Chest Pain    Brief narrative: Rebecca [name] is a 49 y.o. female with PMH of DM2, HTN, HLD, CAD, breast cancer status post bilateral mastectomy and chemotherapy in remission, chemotherapy-induced cardiomyopathy, hypothyroidism d/t radioactive iodine ablation. Patient presented to the ED on 12/25/2019 with persistent cough, fatigue and weakness, progressively worsening for last 1 week.  She tested positive for COVID-19 as an outpatient in the interval, did not require hospitalization but with progressive symptoms, patient came to the ED on 8/28.  In the ER, patient had temperature of 102.2, heart rate in 90s, respiratory 26, 94% oxygen saturation on room air, blood pressure normal. Initial labs with unremarkable BMP except for blood sugar elevated to 2 7, unremarkable CBC.  D-dimer slightly elevated to 0.52, CRP elevated to 6.1 Repeat COVID-19 PCR positive. Chest x-ray showed mild bilateral infiltrates. Patient was admitted for COVID-19 pneumonia.  Subjective: Patient was seen and examined this morning.  Feels better every day. Sitting up in bed.  Using incentive spirometry. On 4 L oxygen by nasal cannula. Oxygen saturation on bedside monitor is in 80s. CRP level improving.  Assessment/Plan: COVID pneumonia -Presented with worsening shortness of breath, cough. -COVID test: Patient was due as an outpatient and on admission -Chest imaging: With mild bilateral infiltrates -Treatment: Currently on 5-day course of IV remdesivir, IV Solu-Medrol at 60 mg twice daily. Patient is not requiring Actemra or baricitinib but she is agreeable to get if she gets worse. -On 4 L oxygen by nasal cannula at rest with oxygen saturation in 80s..  She states however she was able to walk to the bathroom without  supplemental oxygen. Monitor oxygen requirement on ambulation.  Progression: Feels better every day.  However she is requiring 4 L even on rest and oxygen saturation is less than 90%.  Inflammatory markers is however improving.  -Continue remdesivir.  I would increase the frequency of Solu-Medrol to 60 mg every 6 hours. -Protonix while on steroids. -Supportive care: Vitamin C, Zinc, PRN inhalers, Tylenol, Antitussives (benzonatate/ Mucinex/Tussionex).   -Encouraged incentive spirometry, out of bed and early mobilization as much as possible -Continue airborne/contact isolation precautions for duration of 3 weeks from the day of diagnosis. -WBC and inflammatory markers trend as below.  Lab Results  Component Value Date   SARSCOV2NAA POSITIVE (A) 12/25/2019    Recent Labs  Lab 12/25/19 0125 12/25/19 0515 12/26/19 1044 12/27/19 0435 12/27/19 0922  WBC 4.3 4.1  --   --   --   PROCALCITON  --  <0.10  --   --   --   DDIMER  --  0.52*  --  0.40  --   FERRITIN  --   --   --   --  233  CRP  --  6.1* 6.4* 2.6*  --   ALT  --  25  --   --   --    The treatment plan and use of medications and known side effects were discussed with patient/family. Some of the medications used are based on case reports/anecdotal data.  All other medications being used in the management of COVID-19 based on limited study data.  Complete risks and long-term side effects are unknown, however in the best clinical judgment they seem to be of some benefit. Patient wanted to  proceed with treatment options provided.  Accelerated hypertension -Patient's blood pressure was significantly elevated on admission up to 170s. -Improved after Coreg was resumed. Currently in normal range. -Also on IV hydralazine as needed.  History of chemotherapy-induced cardiomyopathy -Last echocardiogram in system from June 2021 showed EF of 40 to 45% which is an improvement from EF of 30 to 35% in March 2021.  -Per patient, she follows up  with cardiologist Dr. Einar Gip.  She says Delene Loll was recently stopped.  She has a follow-up due with Dr. Einar Gip in next few weeks.  Diabetes mellitus type 2 -Home meds include Lab Results  Component Value Date   HGBA1C 8.0 (H) 12/25/2019  -On Metformin 500 mg twice daily at home. -Currently on insulin Lantus 25 units daily, along with sliding scale insulin.  Blood sugar this morning is elevated to 274.  With escalation in steroid dose, blood sugar will likely trend up again.  -I would add 5 units 3 times daily of scheduled NovoLog Premeal.  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  Breast cancer in remission.  Mobility: Encourage ambulation Code Status:   Code Status: Full Code  Nutritional status: Body mass index is 35.15 kg/m.     Diet Order            Diet heart healthy/carb modified Room service appropriate? Yes; Fluid consistency: Thin  Diet effective now                 DVT prophylaxis: Lovenox subcu  Antimicrobials:  None Fluid: None Consultants: None Family Communication:  None at bedside  Status is: Inpatient  Remains inpatient appropriate because patient is still requiring supplemental oxygen, IV antiviral, IV steroids.  Dispo: The patient is from: Home              Anticipated d/c is to: Home              Anticipated d/c date is: 3 days              Patient currently is not medically stable to d/c.  Infusions:  . sodium chloride Stopped (12/26/19 1215)  . remdesivir 100 mg in NS 100 mL 100 mg (12/27/19 1039)    Scheduled Meds: . vitamin C  500 mg Oral Daily  . carvedilol  12.5 mg Oral BID  . cholecalciferol  5,000 Units Oral Daily  . enoxaparin (LOVENOX) injection  55 mg Subcutaneous Q24H  . insulin aspart  0-15 Units Subcutaneous TID WC  . insulin glargine  25 Units Subcutaneous Daily  . levothyroxine  112 mcg Oral Daily  . methylPREDNISolone (SOLU-MEDROL) injection  60 mg Intravenous Q6H  . pantoprazole  40 mg Oral Daily   . venlafaxine XR  75 mg Oral Q breakfast  . zinc sulfate  220 mg Oral Daily    Antimicrobials: Anti-infectives (From admission, onward)   Start     Dose/Rate Route Frequency Ordered Stop   12/26/19 1000  remdesivir 100 mg in sodium chloride 0.9 % 100 mL IVPB       "Followed by" Linked Group Details   100 mg 200 mL/hr over 30 Minutes Intravenous Daily 12/25/19 0412 12/30/19 0959   12/25/19 0500  remdesivir 200 mg in sodium chloride 0.9% 250 mL IVPB       "Followed by" Linked Group Details   200 mg 580 mL/hr over 30 Minutes Intravenous Once 12/25/19 0412 12/25/19 0915      PRN meds: sodium chloride, acetaminophen, chlorpheniramine-HYDROcodone, guaiFENesin-dextromethorphan, ondansetron **  OR** ondansetron (ZOFRAN) IV   Objective: Vitals:   12/27/19 0510 12/27/19 1341  BP:  116/77  Pulse:  71  Resp:  18  Temp:  98.5 F (36.9 C)  SpO2: (!) 86% (!) 82%    Intake/Output Summary (Last 24 hours) at 12/27/2019 1343 Last data filed at 12/27/2019 1000 Gross per 24 hour  Intake 467.68 ml  Output --  Net 467.68 ml   Filed Weights   12/25/19 0037  Weight: 111.1 kg   Weight change:  Body mass index is 35.15 kg/m.   Physical Exam: General exam: Appears calm and comfortable.  Not in physical distress. Skin: No rashes, lesions or ulcers. HEENT: Atraumatic, normocephalic, supple neck, no obvious bleeding Lungs: Minimal bibasilar crackles in both lung bases CVS: Regular rate and rhythm, no murmur GI/Abd soft, nontender, nondistended, bowel sound present CNS: Alert, awake, oriented x3 Psychiatry: Mood appropriate Extremities: No pedal edema, no calf tenderness  Data Review: I have personally reviewed the laboratory data and studies available.  Recent Labs  Lab 12/25/19 0125 12/25/19 0136 12/25/19 0515  WBC 4.3  --  4.1  NEUTROABS 3.3  --  3.2  HGB 12.8 13.3 12.2  HCT 40.9 39.0 38.9  MCV 86.1  --  86.1  PLT 203  --  195   Recent Labs  Lab 12/25/19 0136 12/25/19 0515  12/26/19 1044 12/27/19 0435  NA 138 138 136 137  K 4.0 4.0 4.3 4.4  CL 99 99 98 101  CO2  --  26 25 25   GLUCOSE 207* 242* 369* 264*  BUN 15 15 20  24*  CREATININE 0.90 0.84 0.82 0.76  CALCIUM  --  8.3* 8.8* 8.6*   Lab Results  Component Value Date   HGBA1C 8.0 (H) 12/25/2019       Component Value Date/Time   CHOL 154 01/27/2019 0820   TRIG 158 (H) 01/27/2019 0820   HDL 43 01/27/2019 0820   CHOLHDL 3.6 01/27/2019 0820   CHOLHDL 3.8 11/30/2018 0921   VLDL 59 (H) 02/17/2018 1045   LDLCALC 84 01/27/2019 0820   LDLCALC 97 11/30/2018 0921   LABVLDL 27 01/27/2019 0820   Signed, Terrilee Croak, MD [facility] Pager: [phone] (Secure Chat preferred). 12/27/2019

## 2019-12-28 ENCOUNTER — Other Ambulatory Visit (HOSPITAL_COMMUNITY): Payer: No Typology Code available for payment source

## 2019-12-28 ENCOUNTER — Inpatient Hospital Stay (HOSPITAL_COMMUNITY): Payer: No Typology Code available for payment source

## 2019-12-28 LAB — BASIC METABOLIC PANEL
Anion gap: 9 (ref 5–15)
BUN: 28 mg/dL — ABNORMAL HIGH (ref 6–20)
CO2: 30 mmol/L (ref 22–32)
Calcium: 8.6 mg/dL — ABNORMAL LOW (ref 8.9–10.3)
Chloride: 99 mmol/L (ref 98–111)
Creatinine, Ser: 0.86 mg/dL (ref 0.44–1.00)
GFR calc Af Amer: >60 mL/min (ref >60)
GFR calc non Af Amer: >60 mL/min (ref >60)
Glucose, Bld: 247 mg/dL — ABNORMAL HIGH (ref 70–99)
Potassium: 4.6 mmol/L (ref 3.5–5.1)
Sodium: 138 mmol/L (ref 135–145)

## 2019-12-28 LAB — C-REACTIVE PROTEIN: CRP: 2 mg/dL — ABNORMAL HIGH (ref <1.0)

## 2019-12-28 LAB — GLUCOSE, CAPILLARY
Glucose-Capillary: 263 mg/dL — ABNORMAL HIGH (ref 70–99)
Glucose-Capillary: 369 mg/dL — ABNORMAL HIGH (ref 70–99)
Glucose-Capillary: 236 mg/dL — ABNORMAL HIGH (ref 70–99)
Glucose-Capillary: 270 mg/dL — ABNORMAL HIGH (ref 70–99)

## 2019-12-28 LAB — FERRITIN: Ferritin: 227 ng/mL (ref 11–307)

## 2019-12-28 LAB — BRAIN NATRIURETIC PEPTIDE: B Natriuretic Peptide: 242.2 pg/mL — ABNORMAL HIGH (ref 0.0–100.0)

## 2019-12-28 LAB — D-DIMER, QUANTITATIVE: D-Dimer, Quant: 0.51 ug/mL-FEU — ABNORMAL HIGH (ref 0.00–0.50)

## 2019-12-28 IMAGING — DX DG CHEST 1V PORT
1 series · 1 of 1 positions shown · non-contrast
Comparison: [DATE].  Chest CT [DATE].

CLINICAL DATA: History of bilateral mastectomies. Chemotherapy
induced cardiomyopathy.

EXAM:
PORTABLE CHEST 1 VIEW

[chest ap]
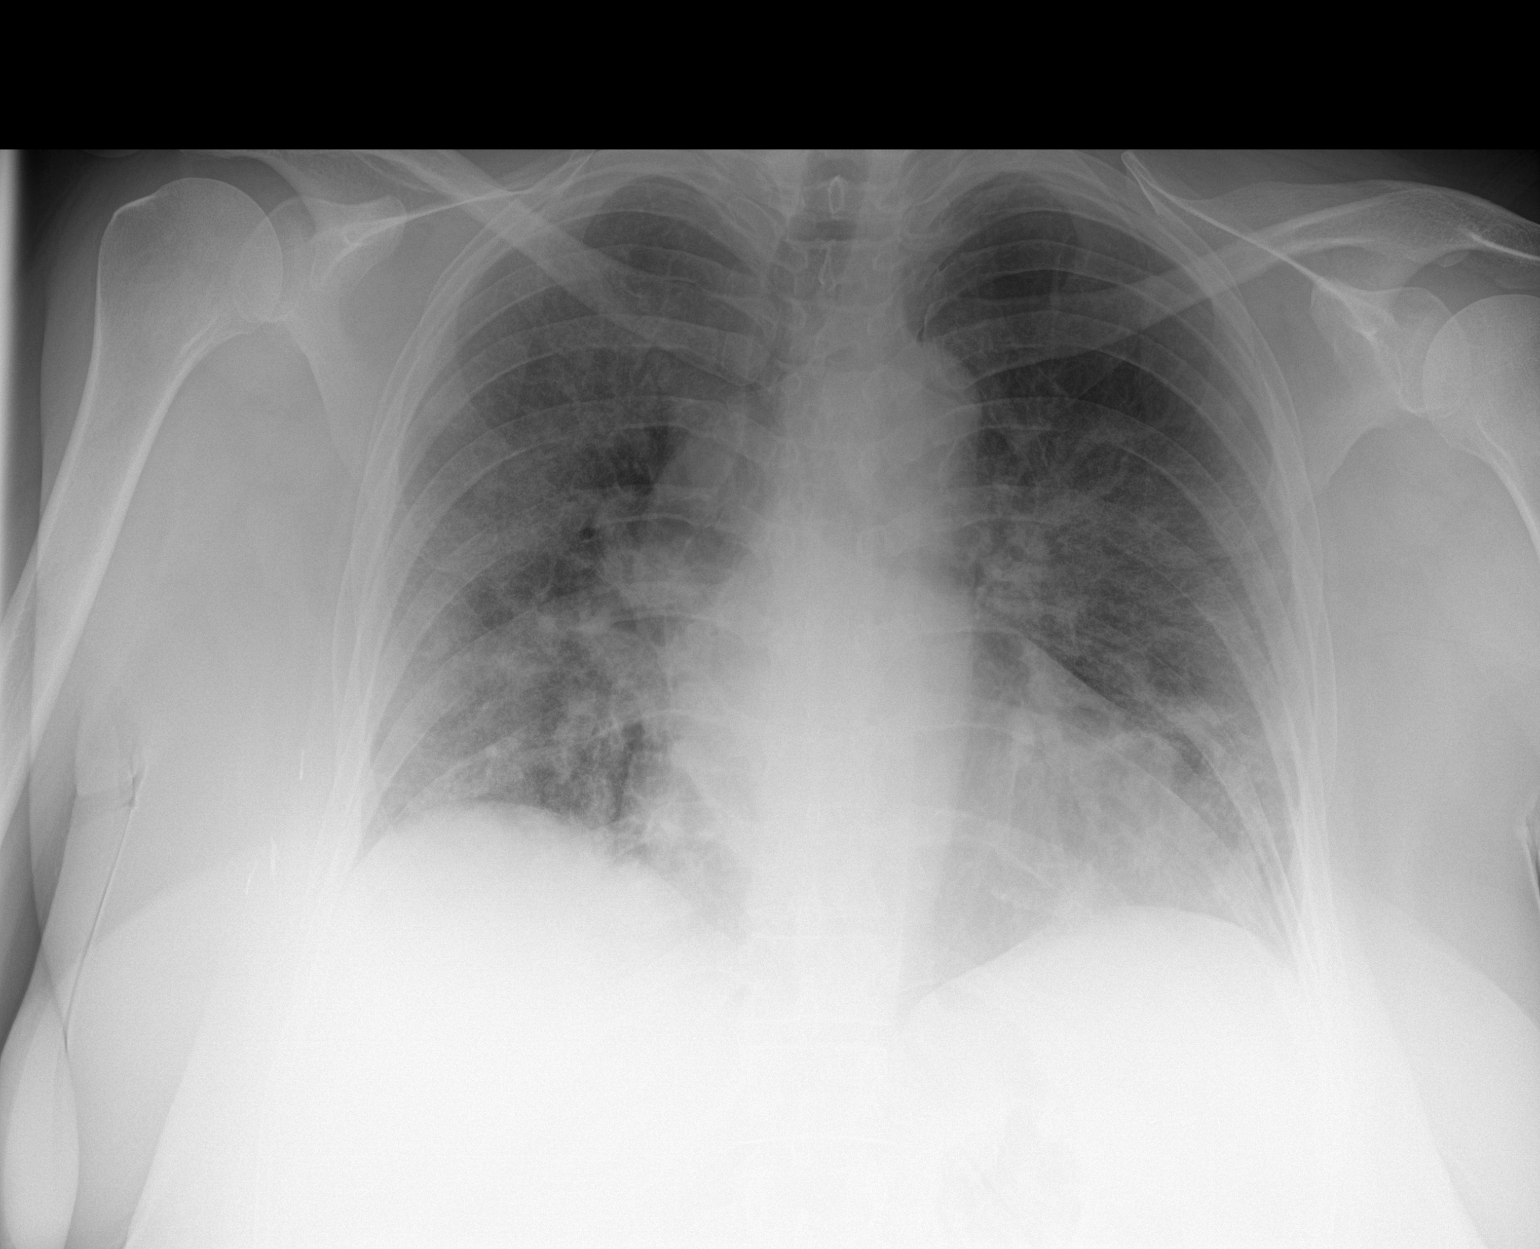

[1 of 1 positions shown; findings below may reference images not displayed]

FINDINGS: Thoracic aorta is again noted be tortuous. Stable cardiomegaly.
Progressive diffuse bilateral interstitial prominence suggesting
CHF. Pneumonitis cannot be excluded. No prominent pleural effusion.
No pneumothorax.
IMPRESSION: 1.  Stable cardiomegaly.

2. Progressive diffuse bilateral interstitial prominence suggesting
CHF. Pneumonitis cannot be excluded.

## 2019-12-28 MED ORDER — FUROSEMIDE 10 MG/ML IJ SOLN
40.0000 mg | Freq: Three times a day (TID) | INTRAMUSCULAR | Status: DC
  Administered 2019-12-28 (×2): 40 mg via INTRAVENOUS
  Filled 2019-12-28 (×2): qty 4

## 2019-12-28 MED ORDER — FUROSEMIDE 10 MG/ML IJ SOLN
40.0000 mg | Freq: Two times a day (BID) | INTRAMUSCULAR | Status: DC
  Administered 2019-12-28: 40 mg via INTRAVENOUS
  Filled 2019-12-28: qty 4

## 2019-12-28 MED ORDER — INSULIN ASPART 100 UNIT/ML ~~LOC~~ SOLN
8.0000 [IU] | Freq: Three times a day (TID) | SUBCUTANEOUS | Status: DC
  Administered 2019-12-28 – 2019-12-30 (×8): 8 [IU] via SUBCUTANEOUS

## 2019-12-28 MED ORDER — LINAGLIPTIN 5 MG PO TABS
5.0000 mg | ORAL_TABLET | Freq: Every day | ORAL | Status: DC
  Administered 2019-12-28 – 2019-12-30 (×3): 5 mg via ORAL
  Filled 2019-12-28 (×3): qty 1

## 2019-12-28 MED ORDER — INSULIN GLARGINE 100 UNIT/ML ~~LOC~~ SOLN
30.0000 [IU] | Freq: Every day | SUBCUTANEOUS | Status: DC
  Administered 2019-12-28 – 2020-01-04 (×8): 30 [IU] via SUBCUTANEOUS
  Filled 2019-12-28 (×8): qty 0.3

## 2019-12-28 MED ORDER — IPRATROPIUM BROMIDE HFA 17 MCG/ACT IN AERS
2.0000 | INHALATION_SPRAY | RESPIRATORY_TRACT | Status: DC
  Administered 2019-12-28 – 2020-01-04 (×39): 2 via RESPIRATORY_TRACT
  Filled 2019-12-28: qty 12.9

## 2019-12-28 MED ORDER — CARVEDILOL 6.25 MG PO TABS
6.2500 mg | ORAL_TABLET | Freq: Two times a day (BID) | ORAL | Status: DC
  Administered 2019-12-28 – 2019-12-31 (×6): 6.25 mg via ORAL
  Filled 2019-12-28 (×6): qty 1

## 2019-12-28 MED ORDER — BARICITINIB 2 MG PO TABS
4.0000 mg | ORAL_TABLET | Freq: Every day | ORAL | Status: DC
  Administered 2019-12-28: 4 mg via ORAL
  Filled 2019-12-28: qty 2

## 2019-12-28 NOTE — Progress Notes (Addendum)
PROGRESS NOTE  Rebecca [name]  DOB: 07-19-1970  PCP: Elby Showers, MD FXT:[id number]  DOA: 12/25/2019  LOS: 3 days   Chief Complaint  Patient presents with  . Covid Exposure  . Chest Pain    Brief narrative: Rebecca [name] is a 49 y.o. female with PMH of DM2, HTN, HLD, CAD, breast cancer status post bilateral mastectomy and chemotherapy in remission, chemotherapy-induced cardiomyopathy, hypothyroidism d/t radioactive iodine ablation. Patient presented to the ED on 12/25/2019 with persistent cough, fatigue and weakness, progressively worsening for last 1 week.  She tested positive for COVID-19 as an outpatient in the interval, did not require hospitalization but with progressive symptoms, patient came to the ED on 8/28.  In the ER, patient had temperature of 102.2, heart rate in 90s, respiratory 26, 94% oxygen saturation on room air, blood pressure normal. Initial labs with unremarkable BMP except for blood sugar elevated to 2 7, unremarkable CBC.  D-dimer slightly elevated to 0.52, CRP elevated to 6.1 Repeat COVID-19 PCR positive. Chest x-ray showed mild bilateral infiltrates. Patient was admitted for COVID-19 pneumonia.  Subjective: Patient was seen and examined this morning.  Events of last night noted.  She was anxious.  Felt better with IV Ativan.  Her oxygen saturation dropped to 70s. O2 requirement bumped up to high flow nasal cannula 15 L this morning. She is trying hard to keep her spirit up. Labs showed CRP level improving.  Assessment/Plan: COVID pneumonia -Presented with worsening shortness of breath, cough. -COVID test: Patient was due as an outpatient and on admission -Chest imaging: With mild bilateral infiltrates  -Treatment: Currently on 5-day course of IV remdesivir, IV Solu-Medrol at 60 mg every 6 hours.  -On 15 L high flow oxygen by nasal cannula.  Progression: In last 24 hours, her oxygen requirement has significantly worsened to 15 L by nasal  cannula despite improving CRP.  Discussed with patient and family.  Start on baricitinib today. -Continue remdesivir and Solu-Medrol IV. -Protonix while on steroids. -Supportive care: Vitamin C, Zinc, PRN inhalers, Tylenol, Antitussives (benzonatate/ Mucinex/Tussionex).   -Encouraged incentive spirometry, out of bed and early mobilization as much as possible -Continue airborne/contact isolation precautions for duration of 3 weeks from the day of diagnosis. -WBC and inflammatory markers trend as below.  Lab Results  Component Value Date   SARSCOV2NAA POSITIVE (A) 12/25/2019    Recent Labs  Lab 12/25/19 0125 12/25/19 0515 12/26/19 1044 12/27/19 0435 12/27/19 0922 12/28/19 0940  WBC 4.3 4.1  --   --   --   --   PROCALCITON  --  <0.10  --   --   --   --   DDIMER  --  0.52*  --  0.40  --  0.51*  FERRITIN  --   --   --   --  233 227  CRP  --  6.1* 6.4* 2.6*  --  2.0*  ALT  --  25  --   --   --   --    The treatment plan and use of medications and known side effects were discussed with patient/family. Some of the medications used are based on case reports/anecdotal data.  All other medications being used in the management of COVID-19 based on limited study data.  Complete risks and long-term side effects are unknown, however in the best clinical judgment they seem to be of some benefit. Patient wanted to proceed with treatment options provided.  Acute exacerbation of systolic CHF. History of chemotherapy-induced cardiomyopathy  Hypertension -In this admission, because of Covid pneumonia patient is on high-dose steroid. Respiratory status worsening.   -Repeat chest x-ray this morning showed possible progressive diffuse bilateral interstitial prominence suggestive of CHF.   -Patient's worsening respiratory status could be from a combination of Covid pneumonia and CHF.  -Discussed with her cardiologist Dr. Einar Gip.  I will start her on IV Lasix 40 mg 3 times daily.  Reduce Coreg to 6.25 mg  twice daily. -Monitor blood pressure, renal function. -Last echocardiogram in system from June 2021 showed EF of 40 to 45% which is an improvement from EF of 30 to 35% in March 2021.  -Obtain repeat echocardiogram today.  Diabetes mellitus type 2 -Home meds include Lab Results  Component Value Date   HGBA1C 8.0 (H) 12/25/2019  -On Metformin 500 mg twice daily at home. -Blood sugar level is running high because of high dose of steroids. -Blood sugar this morning is elevated to 263.   -Insulin dose adjusted to Lantus 30 units daily, NovoLog 8 units 3 times daily.   -Add Tradjenta because of mortality benefit.  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  Breast cancer in remission.  Mobility: Encourage ambulation Code Status:   Code Status: Full Code  Nutritional status: Body mass index is 35.15 kg/m.     Diet Order            Diet heart healthy/carb modified Room service appropriate? Yes; Fluid consistency: Thin  Diet effective now                 DVT prophylaxis: Lovenox subcu  Antimicrobials:  None Fluid: None Consultants: None Family Communication:  Called and updated patient's husband this afternoon.  Status is: Inpatient  Remains inpatient appropriate because patient is still requiring supplemental oxygen, IV antiviral, IV steroids.  Dispo: The patient is from: Home              Anticipated d/c is to: Home              Anticipated d/c date is: 3 days              Patient currently is not medically stable to d/c.  Infusions:  . sodium chloride Stopped (12/26/19 1215)  . remdesivir 100 mg in NS 100 mL 100 mg (12/28/19 0854)    Scheduled Meds: . vitamin C  500 mg Oral Daily  . baricitinib  4 mg Oral Daily  . carvedilol  12.5 mg Oral BID  . cholecalciferol  5,000 Units Oral Daily  . enoxaparin (LOVENOX) injection  55 mg Subcutaneous Q24H  . furosemide  40 mg Intravenous BID  . insulin aspart  0-15 Units Subcutaneous TID WC  .  insulin aspart  8 Units Subcutaneous TID WC  . insulin glargine  30 Units Subcutaneous Daily  . ipratropium  2 puff Inhalation Q4H  . levothyroxine  112 mcg Oral Daily  . linagliptin  5 mg Oral Daily  . methylPREDNISolone (SOLU-MEDROL) injection  60 mg Intravenous Q6H  . pantoprazole  40 mg Oral Daily  . venlafaxine XR  75 mg Oral Q breakfast  . zinc sulfate  220 mg Oral Daily    Antimicrobials: Anti-infectives (From admission, onward)   Start     Dose/Rate Route Frequency Ordered Stop   12/26/19 1000  remdesivir 100 mg in sodium chloride 0.9 % 100 mL IVPB       "Followed by" Linked Group Details   100 mg 200 mL/hr over 30  Minutes Intravenous Daily 12/25/19 0412 12/30/19 0959   12/25/19 0500  remdesivir 200 mg in sodium chloride 0.9% 250 mL IVPB       "Followed by" Linked Group Details   200 mg 580 mL/hr over 30 Minutes Intravenous Once 12/25/19 0412 12/25/19 0915      PRN meds: sodium chloride, acetaminophen, chlorpheniramine-HYDROcodone, guaiFENesin-dextromethorphan, ondansetron **OR** ondansetron (ZOFRAN) IV   Objective: Vitals:   12/28/19 0630 12/28/19 0847  BP:  116/75  Pulse:  70  Resp:    Temp:    SpO2: 90%     Intake/Output Summary (Last 24 hours) at 12/28/2019 1354 Last data filed at 12/28/2019 1306 Gross per 24 hour  Intake 826 ml  Output --  Net 826 ml   Filed Weights   12/25/19 0037  Weight: 111.1 kg   Weight change:  Body mass index is 35.15 kg/m.   Physical Exam: General exam: Anxious, tearful this morning, on 15 L oxygen by nasal cannula Skin: No rashes, lesions or ulcers. HEENT: Atraumatic, normocephalic, supple neck, no obvious bleeding Lungs: Minimal bibasilar crackles in both lung bases CVS: Regular rate and rhythm, no murmur GI/Abd soft, nontender, nondistended, bowel sound present CNS: Alert, awake, oriented x3 Psychiatry: Anxious Extremities: No pedal edema, no calf tenderness  Data Review: I have personally reviewed the laboratory  data and studies available.  Recent Labs  Lab 12/25/19 0125 12/25/19 0136 12/25/19 0515  WBC 4.3  --  4.1  NEUTROABS 3.3  --  3.2  HGB 12.8 13.3 12.2  HCT 40.9 39.0 38.9  MCV 86.1  --  86.1  PLT 203  --  195   Recent Labs  Lab 12/25/19 0136 12/25/19 0515 12/26/19 1044 12/27/19 0435 12/28/19 0349  NA 138 138 136 137 138  K 4.0 4.0 4.3 4.4 4.6  CL 99 99 98 101 99  CO2  --  26 25 25 30   GLUCOSE 207* 242* 369* 264* 247*  BUN 15 15 20  24* 28*  CREATININE 0.90 0.84 0.82 0.76 0.86  CALCIUM  --  8.3* 8.8* 8.6* 8.6*   Lab Results  Component Value Date   HGBA1C 8.0 (H) 12/25/2019       Component Value Date/Time   CHOL 154 01/27/2019 0820   TRIG 158 (H) 01/27/2019 0820   HDL 43 01/27/2019 0820   CHOLHDL 3.6 01/27/2019 0820   CHOLHDL 3.8 11/30/2018 0921   VLDL 59 (H) 02/17/2018 1045   LDLCALC 84 01/27/2019 0820   LDLCALC 97 11/30/2018 0921   LABVLDL 27 01/27/2019 0820   Signed, Terrilee Croak, MD [facility] Pager: [phone] (Secure Chat preferred). 12/28/2019

## 2019-12-28 NOTE — Telephone Encounter (Signed)
From pt

## 2019-12-28 NOTE — Progress Notes (Signed)
Pt arrived in room 1444 from 5W, on 15L HFNC and 15L NRB.

## 2019-12-29 ENCOUNTER — Inpatient Hospital Stay (HOSPITAL_COMMUNITY): Payer: No Typology Code available for payment source

## 2019-12-29 LAB — BASIC METABOLIC PANEL
Anion gap: 15 (ref 5–15)
BUN: 117 mg/dL — ABNORMAL HIGH (ref 6–20)
CO2: 18 mmol/L — ABNORMAL LOW (ref 22–32)
Calcium: 7.1 mg/dL — ABNORMAL LOW (ref 8.9–10.3)
Chloride: 99 mmol/L (ref 98–111)
Creatinine, Ser: 4.33 mg/dL — ABNORMAL HIGH (ref 0.44–1.00)
GFR calc Af Amer: 13 mL/min — ABNORMAL LOW (ref >60)
GFR calc non Af Amer: 11 mL/min — ABNORMAL LOW (ref >60)
Glucose, Bld: 281 mg/dL — ABNORMAL HIGH (ref 70–99)
Potassium: 4.5 mmol/L (ref 3.5–5.1)
Sodium: 132 mmol/L — ABNORMAL LOW (ref 135–145)

## 2019-12-29 LAB — COMPREHENSIVE METABOLIC PANEL
ALT: 22 U/L (ref 0–44)
AST: 18 U/L (ref 15–41)
Albumin: 3.4 g/dL — ABNORMAL LOW (ref 3.5–5.0)
Alkaline Phosphatase: 47 U/L (ref 38–126)
Anion gap: 16 — ABNORMAL HIGH (ref 5–15)
BUN: 35 mg/dL — ABNORMAL HIGH (ref 6–20)
CO2: 27 mmol/L (ref 22–32)
Calcium: 8.8 mg/dL — ABNORMAL LOW (ref 8.9–10.3)
Chloride: 91 mmol/L — ABNORMAL LOW (ref 98–111)
Creatinine, Ser: 0.96 mg/dL (ref 0.44–1.00)
GFR calc Af Amer: >60 mL/min (ref >60)
GFR calc non Af Amer: >60 mL/min (ref >60)
Glucose, Bld: 283 mg/dL — ABNORMAL HIGH (ref 70–99)
Potassium: 4.1 mmol/L (ref 3.5–5.1)
Sodium: 134 mmol/L — ABNORMAL LOW (ref 135–145)
Total Bilirubin: 0.7 mg/dL (ref 0.3–1.2)
Total Protein: 6.9 g/dL (ref 6.5–8.1)

## 2019-12-29 LAB — GLUCOSE, CAPILLARY
Glucose-Capillary: 276 mg/dL — ABNORMAL HIGH (ref 70–99)
Glucose-Capillary: 373 mg/dL — ABNORMAL HIGH (ref 70–99)
Glucose-Capillary: 301 mg/dL — ABNORMAL HIGH (ref 70–99)
Glucose-Capillary: 310 mg/dL — ABNORMAL HIGH (ref 70–99)

## 2019-12-29 LAB — LACTIC ACID, PLASMA: Lactic Acid, Venous: 2.2 mmol/L (ref 0.5–1.9)

## 2019-12-29 LAB — D-DIMER, QUANTITATIVE: D-Dimer, Quant: 0.86 ug/mL-FEU — ABNORMAL HIGH (ref 0.00–0.50)

## 2019-12-29 LAB — FERRITIN: Ferritin: 195 ng/mL (ref 11–307)

## 2019-12-29 LAB — BRAIN NATRIURETIC PEPTIDE: B Natriuretic Peptide: 201.1 pg/mL — ABNORMAL HIGH (ref 0.0–100.0)

## 2019-12-29 MED ORDER — ENOXAPARIN SODIUM 60 MG/0.6ML ~~LOC~~ SOLN
55.0000 mg | SUBCUTANEOUS | Status: DC
  Administered 2019-12-29 – 2020-01-03 (×6): 55 mg via SUBCUTANEOUS
  Filled 2019-12-29 (×6): qty 0.6

## 2019-12-29 MED ORDER — PERFLUTREN LIPID MICROSPHERE
1.0000 mL | INTRAVENOUS | Status: AC | PRN
  Administered 2019-12-29: 2 mL via INTRAVENOUS
  Filled 2019-12-29: qty 10

## 2019-12-29 MED ORDER — BARICITINIB 2 MG PO TABS
2.0000 mg | ORAL_TABLET | Freq: Once | ORAL | Status: AC
  Administered 2019-12-29: 2 mg via ORAL
  Filled 2019-12-29: qty 1

## 2019-12-29 MED ORDER — SPIRONOLACTONE 25 MG PO TABS
25.0000 mg | ORAL_TABLET | Freq: Every day | ORAL | Status: DC
  Administered 2019-12-29 – 2020-01-03 (×6): 25 mg via ORAL
  Filled 2019-12-29 (×6): qty 1

## 2019-12-29 MED ORDER — METHYLPREDNISOLONE SODIUM SUCC 125 MG IJ SOLR
60.0000 mg | Freq: Two times a day (BID) | INTRAMUSCULAR | Status: DC
  Administered 2019-12-29 – 2020-01-02 (×8): 60 mg via INTRAVENOUS
  Filled 2019-12-29 (×8): qty 2

## 2019-12-29 MED ORDER — ENOXAPARIN SODIUM 30 MG/0.3ML ~~LOC~~ SOLN: 30.0000 mg | SUBCUTANEOUS | Status: DC

## 2019-12-29 MED ORDER — INSULIN ASPART 100 UNIT/ML ~~LOC~~ SOLN
3.0000 [IU] | Freq: Once | SUBCUTANEOUS | Status: AC
  Administered 2019-12-29: 3 [IU] via SUBCUTANEOUS

## 2019-12-29 MED ORDER — BARICITINIB 2 MG PO TABS
2.0000 mg | ORAL_TABLET | Freq: Every day | ORAL | Status: DC
  Administered 2019-12-29: 2 mg via ORAL
  Filled 2019-12-29: qty 1

## 2019-12-29 MED ORDER — FUROSEMIDE 40 MG PO TABS
40.0000 mg | ORAL_TABLET | Freq: Two times a day (BID) | ORAL | Status: DC
  Administered 2019-12-29: 40 mg via ORAL
  Filled 2019-12-29: qty 1

## 2019-12-29 MED ORDER — BARICITINIB 2 MG PO TABS
4.0000 mg | ORAL_TABLET | Freq: Every day | ORAL | Status: DC
  Administered 2019-12-30 – 2020-01-04 (×6): 4 mg via ORAL
  Filled 2019-12-29 (×6): qty 2

## 2019-12-29 NOTE — Progress Notes (Signed)
Pt off NRB mask to eat, 02 sat in 70's on 15L HFNC while eating and other minimal activities like using BSC. Pt's 02 sat ranges from mid 80s to 91% with 15L HFNC and 15L NRB mask. Will cont to monitor.

## 2019-12-29 NOTE — Progress Notes (Signed)
Patient's HR dropped to 39 earlier but did not sustain. She was asymptomatic on reassessment. Rebecca Salina NP notified without any new order. Will continue to monitor.

## 2019-12-29 NOTE — Progress Notes (Signed)
Lactic acid is 2.2 this morning. Sharlet Salina NP notified and awaiting new orders. Will continue to monitor.

## 2019-12-29 NOTE — Progress Notes (Signed)
CBG was 310 and no sliding scale. Notified Denny NP and received 3 units of novoLog x one dose. Order already implemented. Will continue to monitor.

## 2019-12-29 NOTE — Progress Notes (Signed)
  Echocardiogram 2D Echocardiogram has been performed.  Fidel Levy 12/29/2019, 4:12 PM

## 2019-12-29 NOTE — Progress Notes (Signed)
PROGRESS NOTE    Rebecca [name]  HWE:[id number] DOB: January 08, 1971 DOA: 12/25/2019 PCP: Elby Showers, MD    Brief Narrative:  Rebecca [name] is a 49 y.o. female with PMH of DM2, HTN, HLD, CAD, breast cancer status post bilateral mastectomy and chemotherapy in remission, chemotherapy-induced cardiomyopathy, hypothyroidism d/t radioactive iodine ablation. Patient presented to the ED on 12/25/2019 with persistent cough, fatigue and weakness, progressively worsening for last 1 week.  She tested positive for COVID-19 as an outpatient in the interval, did not require hospitalization but with progressive symptoms, patient came to the ED on 8/28.  In the ER, patient had temperature of 102.2, heart rate in 90s, respiratory 26, 94% oxygen saturation on room air, blood pressure normal. D-dimer slightly elevated to 0.52, CRP elevated to 6.1 Repeat COVID-19 PCR positive. Chest x-ray showed mild bilateral infiltrates. Patient was admitted for COVID-19 pneumonia.    Assessment & Plan:   Principal Problem:   Acute respiratory disease due to COVID-19 virus Active Problems:   Diabetes mellitus type 2 in obese (HCC)  Acute hypoxemic respiratory failure due to COVID-19 pneumonia: Continue to monitor due to significant symptoms  chest physiotherapy, incentive spirometry, deep breathing exercises, sputum induction, mucolytic's and bronchodilators. Supplemental oxygen to keep saturations more than 90%. Covid directed therapy with , steroids, on high-dose Solu-Medrol remdesivir, day 5/5 Baricitinib, Day 2/14 antibiotics, not indicated Due to severity of symptoms, patient will need daily inflammatory markers, chest x-rays, liver function test to monitor and direct COVID-19 therapies.  [place]    12/27/19 0435 12/27/19 0922 12/28/19 0940 12/29/19 0422  DDIMER 0.40  --  0.51* 0.86*  FERRITIN  --  233 227 195  CRP 2.6*  --  2.0*  --     Lab Results  Component  Value Date   SARSCOV2NAA POSITIVE (A) 12/25/2019   SpO2: (!) 81 % O2 Flow Rate (L/min): 15 L/min FiO2 (%): 100 %  Acute on chronic systolic heart failure: History of chemotherapy-induced cardiomyopathy and hypertension: Started on high-dose Lasix. Discussed with cardiology.  Change to oral Lasix and add Aldactone.  Euvolemic on exam.  Type 2 diabetes: Uncontrolled with hyperglycemia:  Hypothyroidism: Euthyroid on Synthroid replacement.  Breast cancer: In remission.  DVT prophylaxis: Lovenox.  Insulin-dependent type 2 diabetes: With hyperglycemia.  On long-acting insulin and sliding scale insulin.  Also on Tradjenta.  Continue.   Code Status: Full code Family Communication: None.  Patient is communicating. Disposition Plan: Status is: Inpatient  Remains inpatient appropriate because:Inpatient level of care appropriate due to severity of illness   Dispo: The patient is from: Home              Anticipated d/c is to: Home              Anticipated d/c date is: 3 days              Patient currently is not medically stable to d/c.         Consultants:   Cardiology, curbside  Procedures:   None  Antimicrobials:  Anti-infectives (From admission, onward)   Start     Dose/Rate Route Frequency Ordered Stop   12/26/19 1000  remdesivir 100 mg in sodium chloride 0.9 % 100 mL IVPB       "Followed by" Linked Group Details   100 mg 200 mL/hr over 30 Minutes Intravenous Daily 12/25/19 0412 12/29/19 1001   12/25/19 0500  remdesivir 200 mg in sodium chloride 0.9% 250 mL  IVPB       "Followed by" Linked Group Details   200 mg 580 mL/hr over 30 Minutes Intravenous Once 12/25/19 8786 12/25/19 0915         Subjective: Patient seen and examined.  Subjectively feels well but is still on high flow oxygen.  Objective: Vitals:   12/29/19 0641 12/29/19 1049 12/29/19 1151 12/29/19 1200  BP:   121/83   Pulse:   73   Resp:   (!) 24   Temp:   98.1 F (36.7 C)   TempSrc:   Oral    SpO2: 90% 93% 90% (!) 81%  Weight:      Height:        Intake/Output Summary (Last 24 hours) at 12/29/2019 1348 Last data filed at 12/29/2019 1100 Gross per 24 hour  Intake 360 ml  Output --  Net 360 ml   Filed Weights   12/25/19 0037  Weight: 111.1 kg    Examination:  General exam: Appears calm and comfortable  In mild distress with tachypnea on talking. Respiratory system: Clear to auscultation. Respiratory effort normal.  No added sounds. Cardiovascular system: S1 & S2 heard, RRR. No JVD, murmurs, rubs, gallops or clicks. No pedal edema. Gastrointestinal system: Abdomen is nondistended, soft and nontender. No organomegaly or masses felt. Normal bowel sounds heard.  Obese and pendulous. Central nervous system: Alert and oriented. No focal neurological deficits. Extremities: Symmetric 5 x 5 power. Skin: No rashes, lesions or ulcers Psychiatry: Judgement and insight appear normal. Mood & affect appropriate.     Data Reviewed: I have personally reviewed following labs and imaging studies  CBC: Recent Labs  Lab 12/25/19 0125 12/25/19 0136 12/25/19 0515  WBC 4.3  --  4.1  NEUTROABS 3.3  --  3.2  HGB 12.8 13.3 12.2  HCT 40.9 39.0 38.9  MCV 86.1  --  86.1  PLT 203  --  767   Basic Metabolic Panel: Recent Labs  Lab 12/26/19 1044 12/27/19 0435 12/28/19 0349 12/29/19 0422 12/29/19 0830  NA 136 137 138 132* 134*  K 4.3 4.4 4.6 4.5 4.1  CL 98 101 99 99 91*  CO2 25 25 30  18* 27  GLUCOSE 369* 264* 247* 281* 283*  BUN 20 24* 28* 117* 35*  CREATININE 0.82 0.76 0.86 4.33* 0.96  CALCIUM 8.8* 8.6* 8.6* 7.1* 8.8*   GFR: Estimated Creatinine Clearance: 94.6 mL/min (by C-G formula based on SCr of 0.96 mg/dL). Liver Function Tests: Recent Labs  Lab 12/25/19 0515 12/29/19 0830  AST 24 18  ALT 25 22  ALKPHOS 46 47  BILITOT 0.5 0.7  PROT 6.3* 6.9  ALBUMIN 3.6 3.4*   No results for input(s): LIPASE, AMYLASE in the last 168 hours. No results for input(s): AMMONIA in  the last 168 hours. Coagulation Profile: No results for input(s): INR, PROTIME in the last 168 hours. Cardiac Enzymes: No results for input(s): CKTOTAL, CKMB, CKMBINDEX, TROPONINI in the last 168 hours. BNP (last 3 results) No results for input(s): PROBNP in the last 8760 hours. HbA1C: No results for input(s): HGBA1C in the last 72 hours. CBG: Recent Labs  Lab 12/28/19 1123 12/28/19 1651 12/28/19 2207 12/29/19 0742 12/29/19 1146  GLUCAP 369* 236* 270* 276* 373*   Lipid Profile: No results for input(s): CHOL, HDL, LDLCALC, TRIG, CHOLHDL, LDLDIRECT in the last 72 hours. Thyroid Function Tests: No results for input(s): TSH, T4TOTAL, FREET4, T3FREE, THYROIDAB in the last 72 hours. Anemia Panel: Recent Labs    12/28/19 0940 12/29/19  [place]   Sepsis Labs: Recent Labs  Lab 12/25/19 0515 12/29/19 0422  PROCALCITON <0.10  --   LATICACIDVEN  --  2.2*    Recent Results (from the past 240 hour(s))  SARS Coronavirus 2 by RT PCR (hospital order, performed in [facility] hospital lab) Nasopharyngeal Nasopharyngeal Swab     Status: Abnormal   Collection Time: 12/25/19  1:25 AM   Specimen: Nasopharyngeal Swab  Result Value Ref Range Status   SARS Coronavirus 2 POSITIVE (A) NEGATIVE Final    Comment: CRITICAL RESULT CALLED TO, READ BACK BY AND VERIFIED WITH: NASH,J.,RN @ 0329 12/25/19 TURNER,S. (NOTE) SARS-CoV-2 target nucleic acids are DETECTED  SARS-CoV-2 RNA is generally detectable in upper respiratory specimens  during the acute phase of infection.  Positive results are indicative  of the presence of the identified virus, but do not rule out bacterial infection or co-infection with other pathogens not detected by the test.  Clinical correlation with patient history and  other diagnostic information is necessary to determine patient infection status.  The expected result is negative.  Fact Sheet for Patients:    StrictlyIdeas.no   Fact Sheet for Healthcare Providers:   BankingDealers.co.za    This test is not yet approved or cleared by the [place] FDA and  has been authorized for detection and/or diagnosis of SARS-CoV-2 by FDA under an Emergency Use Authorization (EUA).  This EUA will remain in effect (meani ng this test can be used) for the duration of  the COVID-19 declaration under Section 564(b)(1) of the Act, 21 U.S.C. section 360-bbb-3(b)(1), unless the authorization is terminated or revoked sooner.  Performed at [facility], [place] [address] [place],  [address]          Radiology Studies: DG CHEST PORT 1 VIEW  Result Date: 12/28/2019 CLINICAL DATA:  History of bilateral mastectomies. Chemotherapy induced cardiomyopathy. EXAM: PORTABLE CHEST 1 VIEW COMPARISON:  12/25/2019.  Chest CT 03/29/2019. FINDINGS: Thoracic aorta is again noted be tortuous. Stable cardiomegaly. Progressive diffuse bilateral interstitial prominence suggesting CHF. Pneumonitis cannot be excluded. No prominent pleural effusion. No pneumothorax. IMPRESSION: 1.  Stable cardiomegaly. 2. Progressive diffuse bilateral interstitial prominence suggesting CHF. Pneumonitis cannot be excluded. Electronically Signed   By: [place]   On: 12/28/2019 09:55        Scheduled Meds: . vitamin C  500 mg Oral Daily  . baricitinib  2 mg Oral Once  . [START ON 12/30/2019] baricitinib  4 mg Oral Daily  . carvedilol  6.25 mg Oral BID  . cholecalciferol  5,000 Units Oral Daily  . enoxaparin (LOVENOX) injection  55 mg Subcutaneous Q24H  . furosemide  40 mg Oral BID  . insulin aspart  0-15 Units Subcutaneous TID WC  . insulin aspart  8 Units Subcutaneous TID WC  . insulin glargine  30 Units Subcutaneous Daily  . ipratropium  2 puff Inhalation Q4H  . levothyroxine  112 mcg Oral Daily  . linagliptin  5 mg Oral Daily  . methylPREDNISolone  (SOLU-MEDROL) injection  60 mg Intravenous Q12H  . pantoprazole  40 mg Oral Daily  . spironolactone  25 mg Oral Daily  . venlafaxine XR  75 mg Oral Q breakfast  . zinc sulfate  220 mg Oral Daily   Continuous Infusions: . sodium chloride Stopped (12/26/19 1215)     LOS: 4 days    Time spent: 35 minutes    Barb Merino, MD [facility] Pager [phone]

## 2019-12-29 NOTE — TOC Progression Note (Signed)
Transition of Care [facility]) - Progression Note    Patient Details  Name: Rebecca [name] MRN: [id number] Date of Birth: 08-Sep-1970  [facility] of [facility]) CM/SW Contact  Purcell Mouton, RN Phone Number: 12/29/2019, 11:31 AM  Clinical Narrative:    Pt plan to discharge home with spouse. Will continue to follow for discharge needs.    Expected Discharge Plan: Home/Self Care Barriers to Discharge: No Barriers Identified  Expected Discharge Plan and Services Expected Discharge Plan: Home/Self Care       Living arrangements for the past 2 months: Single Family Home                                       Social Determinants of Health (SDOH) Interventions    Readmission Risk Interventions No flowsheet data found.

## 2019-12-30 ENCOUNTER — Ambulatory Visit: Payer: No Typology Code available for payment source | Admitting: Internal Medicine

## 2019-12-30 LAB — GLUCOSE, CAPILLARY
Glucose-Capillary: 177 mg/dL — ABNORMAL HIGH (ref 70–99)
Glucose-Capillary: 330 mg/dL — ABNORMAL HIGH (ref 70–99)
Glucose-Capillary: 322 mg/dL — ABNORMAL HIGH (ref 70–99)
Glucose-Capillary: 155 mg/dL — ABNORMAL HIGH (ref 70–99)

## 2019-12-30 LAB — ECHOCARDIOGRAM COMPLETE
Calc EF: 29.9 %
Height: 70 in
S' Lateral: 3.8 cm
Single Plane A2C EF: 39.5 %
Single Plane A4C EF: 18 %
Weight: 3920 oz

## 2019-12-30 LAB — BASIC METABOLIC PANEL
Anion gap: 11 (ref 5–15)
BUN: 37 mg/dL — ABNORMAL HIGH (ref 6–20)
CO2: 32 mmol/L (ref 22–32)
Calcium: 8.9 mg/dL (ref 8.9–10.3)
Chloride: 95 mmol/L — ABNORMAL LOW (ref 98–111)
Creatinine, Ser: 0.91 mg/dL (ref 0.44–1.00)
GFR calc Af Amer: >60 mL/min (ref >60)
GFR calc non Af Amer: >60 mL/min (ref >60)
Glucose, Bld: 178 mg/dL — ABNORMAL HIGH (ref 70–99)
Potassium: 4.1 mmol/L (ref 3.5–5.1)
Sodium: 138 mmol/L (ref 135–145)

## 2019-12-30 LAB — BRAIN NATRIURETIC PEPTIDE: B Natriuretic Peptide: 197.4 pg/mL — ABNORMAL HIGH (ref 0.0–100.0)

## 2019-12-30 MED ORDER — SACUBITRIL-VALSARTAN 49-51 MG PO TABS
1.0000 | ORAL_TABLET | Freq: Two times a day (BID) | ORAL | Status: DC
  Administered 2019-12-30 – 2020-01-04 (×3): 1 via ORAL
  Filled 2019-12-30 (×10): qty 1

## 2019-12-30 MED ORDER — INSULIN ASPART 100 UNIT/ML ~~LOC~~ SOLN
10.0000 [IU] | Freq: Three times a day (TID) | SUBCUTANEOUS | Status: DC
  Administered 2019-12-30 – 2020-01-04 (×14): 10 [IU] via SUBCUTANEOUS

## 2019-12-30 MED ORDER — FUROSEMIDE 10 MG/ML IJ SOLN
40.0000 mg | Freq: Two times a day (BID) | INTRAMUSCULAR | Status: DC
  Administered 2019-12-30 – 2019-12-31 (×3): 40 mg via INTRAVENOUS
  Filled 2019-12-30 (×7): qty 4

## 2019-12-30 NOTE — Progress Notes (Signed)
I have started her on Entresto 49/51 mg. Previously on 97/103 mg BID until recently.  Consider switching to [place] from [place] in view of CHF and cardiomyopathy.   Adrian Prows, MD, [facility] 12/30/2019, 8:52 PM Office: [phone]

## 2019-12-30 NOTE — Progress Notes (Signed)
Patient is PC and no tele order. She's being monitored without a tele order. Notified Sharlet Salina NP and received an order for it.

## 2019-12-30 NOTE — Progress Notes (Signed)
PROGRESS NOTE    [name]  HDQ:[id number] DOB: [birth year] DOA: 12/25/2019 PCP: Elby Showers, MD    Brief Narrative:  Rebecca [name] is a 49 y.o. female with PMH of DM2, HTN, HLD, CAD, breast cancer status post bilateral mastectomy and chemotherapy in remission, chemotherapy-induced cardiomyopathy, hypothyroidism d/t radioactive iodine ablation. Patient presented to the ED on 12/25/2019 with persistent cough, fatigue and weakness, progressively worsening for last 1 week.  She tested positive for COVID-19 as an outpatient in the interval, did not require hospitalization but with progressive symptoms, patient came to the ED on 8/28.  In the ER, patient had temperature of 102.2, heart rate in 90s, respiratory 26, 94% oxygen saturation on room air, blood pressure normal. D-dimer slightly elevated to 0.52, CRP elevated to 6.1 Repeat COVID-19 PCR positive. Chest x-ray showed mild bilateral infiltrates. Patient was admitted for COVID-19 pneumonia.    Assessment & Plan:   Principal Problem:   Acute respiratory disease due to COVID-19 virus Active Problems:   Diabetes mellitus type 2 in obese (HCC)  Acute hypoxemic respiratory failure due to COVID-19 pneumonia: Continue to monitor due to significant symptoms  chest physiotherapy, incentive spirometry, deep breathing exercises, sputum induction, mucolytic's and bronchodilators. Supplemental oxygen to keep saturations more than 90%. Covid directed therapy with , steroids, on high-dose Solu-Medrol remdesivir, competed therapy. Baricitinib, Day 3/14 antibiotics, not indicated Due to severity of symptoms, patient will need daily inflammatory markers, chest x-rays, liver function test to monitor and direct COVID-19 therapies.  COVID-19 Labs  Recent Labs    12/28/19 0940 12/29/19 0422  DDIMER 0.51* 0.86*  FERRITIN 227 195  CRP 2.0*  --     Lab Results  Component Value Date   SARSCOV2NAA POSITIVE (A) 12/25/2019    SpO2: (!) 88 % O2 Flow Rate (L/min): 14 L/min FiO2 (%): 100 %  Acute on chronic systolic heart failure: History of chemotherapy-induced cardiomyopathy and hypertension: Ejection fraction 25 to 30%. No clinical evidence of peripheral edema. Discussed with cardiology.  Add aldactone. Will try diuresing with iv lasix today.  Hypothyroidism: Euthyroid on Synthroid replacement.  Breast cancer: In remission.  DVT prophylaxis: Lovenox.  Insulin-dependent type 2 diabetes: With hyperglycemia.  On long-acting insulin and sliding scale insulin.  Also on Tradjenta.  Continue.   Code Status: Full code Family Communication: None.  Patient is communicating. Disposition Plan: Status is: Inpatient  Remains inpatient appropriate because:Inpatient level of care appropriate due to severity of illness   Dispo: The patient is from: Home              Anticipated d/c is to: Home              Anticipated d/c date is: 3 days              Patient currently is not medically stable to d/c.         Consultants:   Cardiology, curbside  Procedures:   None  Antimicrobials:  Anti-infectives (From admission, onward)   Start     Dose/Rate Route Frequency Ordered Stop   12/26/19 1000  remdesivir 100 mg in sodium chloride 0.9 % 100 mL IVPB       "Followed by" Linked Group Details   100 mg 200 mL/hr over 30 Minutes Intravenous Daily 12/25/19 0412 12/29/19 1001   12/25/19 0500  remdesivir 200 mg in sodium chloride 0.9% 250 mL IVPB       "Followed by" Linked Group Details   200 mg 580 mL/hr  over 30 Minutes Intravenous Once 12/25/19 0488 12/25/19 0915         Subjective: Patient seen and examined.  She is missing her son and wants to go home tomorrow for his birthday.  Still on 15 L of oxygen.  Denies any chest pain.  Objective: Vitals:   12/30/19 0729 12/30/19 0943 12/30/19 1050 12/30/19 1326  BP:      Pulse:      Resp:      Temp:      TempSrc:      SpO2: (!) 89% (!) 82% 90% (!) 88%   Weight:      Height:       No intake or output data in the 24 hours ending 12/30/19 1330 Filed Weights   12/25/19 0037  Weight: 111.1 kg    Examination:  General exam: Appears calm and comfortable  In mild distress when talking. Respiratory system: Clear to auscultation. Respiratory effort normal.  No added sounds. Cardiovascular system: S1 & S2 heard, RRR. No JVD, murmurs, rubs, gallops or clicks. No pedal edema. Gastrointestinal system: Abdomen is nondistended, soft and nontender. No organomegaly or masses felt. Normal bowel sounds heard.  Obese and pendulous. Central nervous system: Alert and oriented. No focal neurological deficits. Extremities: Symmetric 5 x 5 power. Skin: No rashes, lesions or ulcers Psychiatry: Judgement and insight appear normal. Mood & affect appropriate.     Data Reviewed: I have personally reviewed following labs and imaging studies  CBC: Recent Labs  Lab 12/25/19 0125 12/25/19 0136 12/25/19 0515  WBC 4.3  --  4.1  NEUTROABS 3.3  --  3.2  HGB 12.8 13.3 12.2  HCT 40.9 39.0 38.9  MCV 86.1  --  86.1  PLT 203  --  891   Basic Metabolic Panel: Recent Labs  Lab 12/27/19 0435 12/28/19 0349 12/29/19 0422 12/29/19 0830 12/30/19 0436  NA 137 138 132* 134* 138  K 4.4 4.6 4.5 4.1 4.1  CL 101 99 99 91* 95*  CO2 25 30 18* 27 32  GLUCOSE 264* 247* 281* 283* 178*  BUN 24* 28* 117* 35* 37*  CREATININE 0.76 0.86 4.33* 0.96 0.91  CALCIUM 8.6* 8.6* 7.1* 8.8* 8.9   GFR: Estimated Creatinine Clearance: 99.8 mL/min (by C-G formula based on SCr of 0.91 mg/dL). Liver Function Tests: Recent Labs  Lab 12/25/19 0515 12/29/19 0830  AST 24 18  ALT 25 22  ALKPHOS 46 47  BILITOT 0.5 0.7  PROT 6.3* 6.9  ALBUMIN 3.6 3.4*   No results for input(s): LIPASE, AMYLASE in the last 168 hours. No results for input(s): AMMONIA in the last 168 hours. Coagulation Profile: No results for input(s): INR, PROTIME in the last 168 hours. Cardiac Enzymes: No  results for input(s): CKTOTAL, CKMB, CKMBINDEX, TROPONINI in the last 168 hours. BNP (last 3 results) No results for input(s): PROBNP in the last 8760 hours. HbA1C: No results for input(s): HGBA1C in the last 72 hours. CBG: Recent Labs  Lab 12/29/19 1146 12/29/19 1659 12/29/19 2015 12/30/19 0839 12/30/19 1158  GLUCAP 373* 301* 310* 177* 330*   Lipid Profile: No results for input(s): CHOL, HDL, LDLCALC, TRIG, CHOLHDL, LDLDIRECT in the last 72 hours. Thyroid Function Tests: No results for input(s): TSH, T4TOTAL, FREET4, T3FREE, THYROIDAB in the last 72 hours. Anemia Panel: Recent Labs    12/28/19 0940 12/29/19 0422  FERRITIN 227 195   Sepsis Labs: Recent Labs  Lab 12/25/19 0515 12/29/19 0422  PROCALCITON <0.10  --   LATICACIDVEN  --  2.2*    Recent Results (from the past 240 hour(s))  SARS Coronavirus 2 by RT PCR (hospital order, performed in [facility] hospital lab) Nasopharyngeal Nasopharyngeal Swab     Status: Abnormal   Collection Time: 12/25/19  1:25 AM   Specimen: Nasopharyngeal Swab  Result Value Ref Range Status   SARS Coronavirus 2 POSITIVE (A) NEGATIVE Final    Comment: CRITICAL RESULT CALLED TO, READ BACK BY AND VERIFIED WITH: NASH,J.,RN @ 0329 12/25/19 TURNER,S. (NOTE) SARS-CoV-2 target nucleic acids are DETECTED  SARS-CoV-2 RNA is generally detectable in upper respiratory specimens  during the acute phase of infection.  Positive results are indicative  of the presence of the identified virus, but do not rule out bacterial infection or co-infection with other pathogens not detected by the test.  Clinical correlation with patient history and  other diagnostic information is necessary to determine patient infection status.  The expected result is negative.  Fact Sheet for Patients:   StrictlyIdeas.no   Fact Sheet for Healthcare Providers:   BankingDealers.co.za    This test is not yet approved or cleared  by the [place] FDA and  has been authorized for detection and/or diagnosis of SARS-CoV-2 by FDA under an Emergency Use Authorization (EUA).  This EUA will remain in effect (meani ng this test can be used) for the duration of  the COVID-19 declaration under Section 564(b)(1) of the Act, 21 U.S.C. section 360-bbb-3(b)(1), unless the authorization is terminated or revoked sooner.  Performed at [facility], [place] [address] [place], [place] [address]          Radiology Studies: ECHOCARDIOGRAM COMPLETE  Result Date: 12/30/2019    ECHOCARDIOGRAM REPORT   Patient Name:   Rebecca [name] Date of Exam: 12/29/2019 Medical Rec #:  [id number]            Height:       70.0 in Accession #:    [id number]           Weight:       245.0 lb Date of Birth:  1970-08-17            BSA:          2.275 m Patient Age:    49 years             BP:           121/83 mmHg Patient Gender: F                    HR:           70 bpm. Exam Location:  Inpatient Procedure: 2D Echo, Cardiac Doppler, Color Doppler and Intracardiac            Opacification Agent Indications:     Abnormal ECG 794.31 / R94.31  History:         Patient has prior history of Echocardiogram examinations, most                  recent 09/29/2019. CAD, Arrythmias:LBBB; Risk                  Factors:Hypertension, Dyslipidemia, Diabetes and GERD.  Sonographer:     Bernadene Person RDCS Referring Phys:  [id number] Terrilee Croak Diagnosing Phys: Adrian Prows MD IMPRESSIONS  1. Left ventricular ejection fraction, by estimation, is 25 to 30%. The left ventricle has severely decreased function. The left ventricle demonstrates global hypokinesis. Left ventricular diastolic function could not be evaluated.  2. Right ventricular systolic function is normal. The right ventricular size is normal.  3. Left atrial size was mildly dilated.  4. The mitral valve is normal in structure. No evidence of mitral valve regurgitation. No evidence of mitral stenosis.  5.  The aortic valve is normal in structure. Aortic valve regurgitation is not visualized. No aortic stenosis is present.  6. The inferior vena cava is normal in size with greater than 50% respiratory variability, suggesting right atrial pressure of 3 mmHg. Comparison(s): Prior images unable to be directly viewed, comparison made by report only. Compared to 09/29/2019, EF has decreased from 40%. Findings consistent with cardiomyopathy. FINDINGS  Left Ventricle: Left ventricular ejection fraction, by estimation, is 25 to 30%. The left ventricle has severely decreased function. The left ventricle demonstrates global hypokinesis. Definity contrast agent was given IV to delineate the left ventricular endocardial borders. The left ventricular internal cavity size was normal in size. There is no left ventricular hypertrophy. Abnormal (paradoxical) septal motion, consistent with left bundle branch block. Left ventricular diastolic function could not be evaluated. Right Ventricle: The right ventricular size is normal. No increase in right ventricular wall thickness. Right ventricular systolic function is normal. Left Atrium: Left atrial size was mildly dilated. Right Atrium: Right atrial size was normal in size. Pericardium: There is no evidence of pericardial effusion. Mitral Valve: The mitral valve is normal in structure. Normal mobility of the mitral valve leaflets. No evidence of mitral valve regurgitation. No evidence of mitral valve stenosis. Tricuspid Valve: The tricuspid valve is normal in structure. Tricuspid valve regurgitation is not demonstrated. No evidence of tricuspid stenosis. Aortic Valve: The aortic valve is normal in structure. Aortic valve regurgitation is not visualized. No aortic stenosis is present. Pulmonic Valve: The pulmonic valve was grossly normal. Pulmonic valve regurgitation is not visualized. Aorta: The aortic root is normal in size and structure. Venous: The inferior vena cava is normal in size  with greater than 50% respiratory variability, suggesting right atrial pressure of 3 mmHg. IAS/Shunts: No atrial level shunt detected by color flow Doppler.  LEFT VENTRICLE PLAX 2D LVIDd:         5.20 cm LVIDs:         3.80 cm LV PW:         0.90 cm LV IVS:        1.00 cm LVOT diam:     2.30 cm LV SV:         71 LV SV Index:   31 LVOT Area:     4.15 cm  LV Volumes (MOD) LV vol d, MOD A2C: 210.0 ml LV vol d, MOD A4C: 186.5 ml LV vol s, MOD A2C: 127.0 ml LV vol s, MOD A4C: 153.0 ml LV SV MOD A2C:     83.0 ml LV SV MOD A4C:     186.5 ml LV SV MOD BP:      59.9 ml RIGHT VENTRICLE RV S prime:     7.72 cm/s TAPSE (M-mode): 1.0 cm LEFT ATRIUM             Index       RIGHT ATRIUM          Index LA Vol (A2C):   57.5 ml 25.27 ml/m RA Area:     9.15 cm LA Vol (A4C):   79.2 ml 34.81 ml/m RA Volume:   15.90 ml 6.99 ml/m LA Biplane Vol: 70.1 ml 30.81 ml/m  AORTIC VALVE LVOT Vmax:   81.60 cm/s LVOT  Vmean:  57.700 cm/s LVOT VTI:    0.170 m  AORTA Ao Root diam: 3.20 cm Ao Asc diam:  3.50 cm  SHUNTS Systemic VTI:  0.17 m Systemic Diam: 2.30 cm Adrian Prows MD Electronically signed by Adrian Prows MD Signature Date/Time: 12/30/2019/6:02:30 AM    Final         Scheduled Meds:  vitamin C  500 mg Oral Daily   baricitinib  4 mg Oral Daily   carvedilol  6.25 mg Oral BID   cholecalciferol  5,000 Units Oral Daily   enoxaparin (LOVENOX) injection  55 mg Subcutaneous Q24H   furosemide  40 mg Intravenous BID   insulin aspart  0-15 Units Subcutaneous TID WC   insulin aspart  10 Units Subcutaneous TID WC   insulin glargine  30 Units Subcutaneous Daily   ipratropium  2 puff Inhalation Q4H   levothyroxine  112 mcg Oral Daily   linagliptin  5 mg Oral Daily   methylPREDNISolone (SOLU-MEDROL) injection  60 mg Intravenous Q12H   pantoprazole  40 mg Oral Daily   spironolactone  25 mg Oral Daily   venlafaxine XR  75 mg Oral Q breakfast   zinc sulfate  220 mg Oral Daily   Continuous Infusions:  sodium chloride  Stopped (12/26/19 1215)     LOS: 5 days    Time spent: 35 minutes    Barb Merino, MD [facility] Pager [phone]

## 2019-12-30 NOTE — Progress Notes (Signed)
Inpatient Diabetes Program Recommendations  AACE/ADA: New Consensus Statement on Inpatient Glycemic Control (2015)  Target Ranges:  Prepandial:   less than 140 mg/dL      Peak postprandial:   less than 180 mg/dL (1-2 hours)      Critically ill patients:  140 - 180 mg/dL   Lab Results  Component Value Date   GLUCAP 177 (H) 12/30/2019   HGBA1C 8.0 (H) 12/25/2019    Review of Glycemic Control  Diabetes history: DM2 Outpatient Diabetes medications: metformin 500 mg bid Current orders for Inpatient glycemic control: Lantus 30 units QD, Novolog 0-15 units tidwc + 8 units tidwc, tradjenta 5 mg QD  On Solumedrol 60 mg Q12H HgbA1C - 8%  Inpatient Diabetes Program Recommendations:     Increase Novolog to 10 units tidwc Add Novolog HS correction.  Continue to follow.  Thank you. Lorenda Peck, RD, LDN, CDE Inpatient Diabetes Coordinator [phone]

## 2019-12-31 LAB — BASIC METABOLIC PANEL
Anion gap: 20 — ABNORMAL HIGH (ref 5–15)
BUN: 41 mg/dL — ABNORMAL HIGH (ref 6–20)
CO2: 24 mmol/L (ref 22–32)
Calcium: 8.7 mg/dL — ABNORMAL LOW (ref 8.9–10.3)
Chloride: 93 mmol/L — ABNORMAL LOW (ref 98–111)
Creatinine, Ser: 0.95 mg/dL (ref 0.44–1.00)
GFR calc Af Amer: >60 mL/min (ref >60)
GFR calc non Af Amer: >60 mL/min (ref >60)
Glucose, Bld: 199 mg/dL — ABNORMAL HIGH (ref 70–99)
Potassium: 4 mmol/L (ref 3.5–5.1)
Sodium: 137 mmol/L (ref 135–145)

## 2019-12-31 LAB — GLUCOSE, CAPILLARY
Glucose-Capillary: 228 mg/dL — ABNORMAL HIGH (ref 70–99)
Glucose-Capillary: 271 mg/dL — ABNORMAL HIGH (ref 70–99)
Glucose-Capillary: 245 mg/dL — ABNORMAL HIGH (ref 70–99)
Glucose-Capillary: 220 mg/dL — ABNORMAL HIGH (ref 70–99)

## 2019-12-31 LAB — BRAIN NATRIURETIC PEPTIDE: B Natriuretic Peptide: 284 pg/mL — ABNORMAL HIGH (ref 0.0–100.0)

## 2019-12-31 MED ORDER — DAPAGLIFLOZIN PROPANEDIOL 5 MG PO TABS
5.0000 mg | ORAL_TABLET | Freq: Every day | ORAL | Status: DC
  Administered 2019-12-31 – 2020-01-04 (×5): 5 mg via ORAL
  Filled 2019-12-31 (×5): qty 1

## 2019-12-31 MED ORDER — CARVEDILOL 6.25 MG PO TABS
6.2500 mg | ORAL_TABLET | Freq: Two times a day (BID) | ORAL | Status: DC
  Administered 2020-01-02 – 2020-01-04 (×5): 6.25 mg via ORAL
  Filled 2019-12-31 (×7): qty 1

## 2019-12-31 NOTE — Plan of Care (Signed)

## 2019-12-31 NOTE — Progress Notes (Signed)
PROGRESS NOTE    Rebecca [name]  UKG:[id number] DOB: [birth year] DOA: 12/25/2019 PCP: Elby Showers, MD    Brief Narrative:  Rebecca [name] is a 49 y.o. female with PMH of DM2, HTN, HLD, CAD, breast cancer status post bilateral mastectomy and chemotherapy in remission, chemotherapy-induced cardiomyopathy, hypothyroidism d/t radioactive iodine ablation. Patient presented to the ED on 12/25/2019 with persistent cough, fatigue and weakness, progressively worsening for last 1 week.  She tested positive for COVID-19 as an outpatient in the interval, did not require hospitalization but with progressive symptoms, patient came to the ED on 8/28.  In the ER, patient had temperature of 102.2, heart rate in 90s, respiratory 26, 94% oxygen saturation on room air, blood pressure normal. D-dimer slightly elevated to 0.52, CRP elevated to 6.1 Repeat COVID-19 PCR positive. Chest x-ray showed mild bilateral infiltrates. Patient was admitted for COVID-19 pneumonia.    Assessment & Plan:   Principal Problem:   Acute respiratory disease due to COVID-19 virus Active Problems:   Diabetes mellitus type 2 in obese (HCC)  Acute hypoxemic respiratory failure due to COVID-19 pneumonia: Continue to monitor due to significant symptoms  chest physiotherapy, incentive spirometry, deep breathing exercises, sputum induction, mucolytic's and bronchodilators. Supplemental oxygen to keep saturations more than 85%. Covid directed therapy with , steroids, on high-dose Solu-Medrol remdesivir, competed therapy. Baricitinib, Day 4/14 antibiotics, not indicated Due to severity of symptoms, patient will need daily inflammatory markers, chest x-rays, liver function test to monitor and direct COVID-19 therapies.  COVID-19 Labs  Recent Labs    12/29/19 0422  DDIMER 0.86*  FERRITIN 195    Lab Results  Component Value Date   SARSCOV2NAA POSITIVE (A) 12/25/2019   SpO2: (!) 87 % O2 Flow Rate (L/min):  12 L/min FiO2 (%): 86 %  Acute on chronic systolic heart failure: History of chemotherapy-induced cardiomyopathy and hypertension: Ejection fraction 25 to 30%. No clinical evidence of peripheral edema. IV Lasix 40 mg twice a day Started on [place] at [place] We will add [place] follow-up with cardiology.  Hypothyroidism: Euthyroid on Synthroid replacement.  Breast cancer: In remission.  DVT prophylaxis: Lovenox.  Insulin-dependent type 2 diabetes: With hyperglycemia.  On long-acting insulin and sliding scale insulin.  Also on Tradjenta that we will change to [place].   Code Status: Full code Family Communication: None.  Patient is communicating. Disposition Plan: Status is: Inpatient  Remains inpatient appropriate because:Inpatient level of care appropriate due to severity of illness   Dispo: The patient is from: Home              Anticipated d/c is to: Home              Anticipated d/c date is: 3 days              Patient currently is not medically stable to d/c.         Consultants:   Cardiology, curbside  Procedures:   None  Antimicrobials:  Anti-infectives (From admission, onward)   Start     Dose/Rate Route Frequency Ordered Stop   12/26/19 1000  remdesivir 100 mg in sodium chloride 0.9 % 100 mL IVPB       "Followed by" Linked Group Details   100 mg 200 mL/hr over 30 Minutes Intravenous Daily 12/25/19 0412 12/29/19 1001   12/25/19 0500  remdesivir 200 mg in sodium chloride 0.9% 250 mL IVPB       "Followed by" Linked Group Details   200 mg 580 mL/hr over  30 Minutes Intravenous Once 12/25/19 1062 12/25/19 0915         Subjective:  Patient was seen and examined. No overnight events. She herself feels okay. Still on high flow oxygen. She is very upset about not able to celebrate her son's 11th birthday today and is planning to do FaceTime with them in the evening.  Objective: Vitals:   12/31/19 0549 12/31/19 0606 12/31/19 1032 12/31/19 1140  BP:  (!) 69/51 (!) 84/58    Pulse: 61 68    Resp: 20 20    Temp: 97.7 F (36.5 C) 97.6 F (36.4 C)    TempSrc: Oral Oral    SpO2: (!) 85% (!) 86% (!) 88% (!) 87%  Weight:      Height:       No intake or output data in the 24 hours ending 12/31/19 1314 Filed Weights   12/25/19 0037  Weight: 111.1 kg    Examination:  General exam: Appears calm and comfortable  In mild distress when talking. Respiratory system: Clear to auscultation. Respiratory effort normal.  No added sounds. Cardiovascular system: S1 & S2 heard, RRR. No JVD, murmurs, rubs, gallops or clicks. No pedal edema. Gastrointestinal system: Abdomen is nondistended, soft and nontender. No organomegaly or masses felt. Normal bowel sounds heard.  Obese and pendulous. Central nervous system: Alert and oriented. No focal neurological deficits. Extremities: Symmetric 5 x 5 power. Skin: No rashes, lesions or ulcers Psychiatry: Judgement and insight appear normal. Mood & affect appropriate.     Data Reviewed: I have personally reviewed following labs and imaging studies  CBC: Recent Labs  Lab 12/25/19 0125 12/25/19 0136 12/25/19 0515  WBC 4.3  --  4.1  NEUTROABS 3.3  --  3.2  HGB 12.8 13.3 12.2  HCT 40.9 39.0 38.9  MCV 86.1  --  86.1  PLT 203  --  694   Basic Metabolic Panel: Recent Labs  Lab 12/28/19 0349 12/29/19 0422 12/29/19 0830 12/30/19 0436 12/31/19 0431  NA 138 132* 134* 138 137  K 4.6 4.5 4.1 4.1 4.0  CL 99 99 91* 95* 93*  CO2 30 18* 27 32 24  GLUCOSE 247* 281* 283* 178* 199*  BUN 28* 117* 35* 37* 41*  CREATININE 0.86 4.33* 0.96 0.91 0.95  CALCIUM 8.6* 7.1* 8.8* 8.9 8.7*   GFR: Estimated Creatinine Clearance: 95.6 mL/min (by C-G formula based on SCr of 0.95 mg/dL). Liver Function Tests: Recent Labs  Lab 12/25/19 0515 12/29/19 0830  AST 24 18  ALT 25 22  ALKPHOS 46 47  BILITOT 0.5 0.7  PROT 6.3* 6.9  ALBUMIN 3.6 3.4*   No results for input(s): LIPASE, AMYLASE in the last 168 hours. No  results for input(s): AMMONIA in the last 168 hours. Coagulation Profile: No results for input(s): INR, PROTIME in the last 168 hours. Cardiac Enzymes: No results for input(s): CKTOTAL, CKMB, CKMBINDEX, TROPONINI in the last 168 hours. BNP (last 3 results) No results for input(s): PROBNP in the last 8760 hours. HbA1C: No results for input(s): HGBA1C in the last 72 hours. CBG: Recent Labs  Lab 12/30/19 1158 12/30/19 1635 12/30/19 2223 12/31/19 0811 12/31/19 1247  GLUCAP 330* 322* 155* 228* 271*   Lipid Profile: No results for input(s): CHOL, HDL, LDLCALC, TRIG, CHOLHDL, LDLDIRECT in the last 72 hours. Thyroid Function Tests: No results for input(s): TSH, T4TOTAL, FREET4, T3FREE, THYROIDAB in the last 72 hours. Anemia Panel: Recent Labs    12/29/19 0422  FERRITIN 195   Sepsis Labs:  Recent Labs  Lab 12/25/19 0515 12/29/19 0422  PROCALCITON <0.10  --   LATICACIDVEN  --  2.2*    Recent Results (from the past 240 hour(s))  SARS Coronavirus 2 by RT PCR (hospital order, performed in [facility] hospital lab) Nasopharyngeal Nasopharyngeal Swab     Status: Abnormal   Collection Time: 12/25/19  1:25 AM   Specimen: Nasopharyngeal Swab  Result Value Ref Range Status   SARS Coronavirus 2 POSITIVE (A) NEGATIVE Final    Comment: CRITICAL RESULT CALLED TO, READ BACK BY AND VERIFIED WITH: NASH,J.,RN @ 0329 12/25/19 TURNER,S. (NOTE) SARS-CoV-2 target nucleic acids are DETECTED  SARS-CoV-2 RNA is generally detectable in upper respiratory specimens  during the acute phase of infection.  Positive results are indicative  of the presence of the identified virus, but do not rule out bacterial infection or co-infection with other pathogens not detected by the test.  Clinical correlation with patient history and  other diagnostic information is necessary to determine patient infection status.  The expected result is negative.  Fact Sheet for Patients:     StrictlyIdeas.no   Fact Sheet for Healthcare Providers:   BankingDealers.co.za    This test is not yet approved or cleared by the [place] FDA and  has been authorized for detection and/or diagnosis of SARS-CoV-2 by FDA under an Emergency Use Authorization (EUA).  This EUA will remain in effect (meani ng this test can be used) for the duration of  the COVID-19 declaration under Section 564(b)(1) of the Act, 21 U.S.C. section 360-bbb-3(b)(1), unless the authorization is terminated or revoked sooner.  Performed at [facility], [place] [address] [place], [place] [address]          Radiology Studies: ECHOCARDIOGRAM COMPLETE  Result Date: 12/30/2019    ECHOCARDIOGRAM REPORT   Patient Name:   VALLEY KE Date of Exam: 12/29/2019 Medical Rec #:  [id number]            Height:       70.0 in Accession #:    [id number]           Weight:       245.0 lb Date of Birth:  02/25/1971            BSA:          2.275 m Patient Age:    62 years             BP:           121/83 mmHg Patient Gender: F                    HR:           70 bpm. Exam Location:  Inpatient Procedure: 2D Echo, Cardiac Doppler, Color Doppler and Intracardiac            Opacification Agent Indications:     Abnormal ECG 794.31 / R94.31  History:         Patient has prior history of Echocardiogram examinations, most                  recent 09/29/2019. CAD, Arrythmias:LBBB; Risk                  Factors:Hypertension, Dyslipidemia, Diabetes and GERD.  Sonographer:     Bernadene Person RDCS Referring Phys:  [id number] Terrilee Croak Diagnosing Phys: Adrian Prows MD IMPRESSIONS  1. Left ventricular ejection fraction, by estimation, is 25 to 30%. The  left ventricle has severely decreased function. The left ventricle demonstrates global hypokinesis. Left ventricular diastolic function could not be evaluated.  2. Right ventricular systolic function is normal. The right ventricular size  is normal.  3. Left atrial size was mildly dilated.  4. The mitral valve is normal in structure. No evidence of mitral valve regurgitation. No evidence of mitral stenosis.  5. The aortic valve is normal in structure. Aortic valve regurgitation is not visualized. No aortic stenosis is present.  6. The inferior vena cava is normal in size with greater than 50% respiratory variability, suggesting right atrial pressure of 3 mmHg. Comparison(s): Prior images unable to be directly viewed, comparison made by report only. Compared to 09/29/2019, EF has decreased from 40%. Findings consistent with cardiomyopathy. FINDINGS  Left Ventricle: Left ventricular ejection fraction, by estimation, is 25 to 30%. The left ventricle has severely decreased function. The left ventricle demonstrates global hypokinesis. Definity contrast agent was given IV to delineate the left ventricular endocardial borders. The left ventricular internal cavity size was normal in size. There is no left ventricular hypertrophy. Abnormal (paradoxical) septal motion, consistent with left bundle branch block. Left ventricular diastolic function could not be evaluated. Right Ventricle: The right ventricular size is normal. No increase in right ventricular wall thickness. Right ventricular systolic function is normal. Left Atrium: Left atrial size was mildly dilated. Right Atrium: Right atrial size was normal in size. Pericardium: There is no evidence of pericardial effusion. Mitral Valve: The mitral valve is normal in structure. Normal mobility of the mitral valve leaflets. No evidence of mitral valve regurgitation. No evidence of mitral valve stenosis. Tricuspid Valve: The tricuspid valve is normal in structure. Tricuspid valve regurgitation is not demonstrated. No evidence of tricuspid stenosis. Aortic Valve: The aortic valve is normal in structure. Aortic valve regurgitation is not visualized. No aortic stenosis is present. Pulmonic Valve: The pulmonic  valve was grossly normal. Pulmonic valve regurgitation is not visualized. Aorta: The aortic root is normal in size and structure. Venous: The inferior vena cava is normal in size with greater than 50% respiratory variability, suggesting right atrial pressure of 3 mmHg. IAS/Shunts: No atrial level shunt detected by color flow Doppler.  LEFT VENTRICLE PLAX 2D LVIDd:         5.20 cm LVIDs:         3.80 cm LV PW:         0.90 cm LV IVS:        1.00 cm LVOT diam:     2.30 cm LV SV:         71 LV SV Index:   31 LVOT Area:     4.15 cm  LV Volumes (MOD) LV vol d, MOD A2C: 210.0 ml LV vol d, MOD A4C: 186.5 ml LV vol s, MOD A2C: 127.0 ml LV vol s, MOD A4C: 153.0 ml LV SV MOD A2C:     83.0 ml LV SV MOD A4C:     186.5 ml LV SV MOD BP:      59.9 ml RIGHT VENTRICLE RV S prime:     7.72 cm/s TAPSE (M-mode): 1.0 cm LEFT ATRIUM             Index       RIGHT ATRIUM          Index LA Vol (A2C):   57.5 ml 25.27 ml/m RA Area:     9.15 cm LA Vol (A4C):   79.2 ml 34.81 ml/m RA Volume:  15.90 ml 6.99 ml/m LA Biplane Vol: 70.1 ml 30.81 ml/m  AORTIC VALVE LVOT Vmax:   81.60 cm/s LVOT Vmean:  57.700 cm/s LVOT VTI:    0.170 m  AORTA Ao Root diam: 3.20 cm Ao Asc diam:  3.50 cm  SHUNTS Systemic VTI:  0.17 m Systemic Diam: 2.30 cm Adrian Prows MD Electronically signed by Adrian Prows MD Signature Date/Time: 12/30/2019/6:02:30 AM    Final         Scheduled Meds:  vitamin C  500 mg Oral Daily   baricitinib  4 mg Oral Daily   carvedilol  6.25 mg Oral BID   cholecalciferol  5,000 Units Oral Daily   dapagliflozin propanediol  5 mg Oral Daily   enoxaparin (LOVENOX) injection  55 mg Subcutaneous Q24H   furosemide  40 mg Intravenous BID   insulin aspart  0-15 Units Subcutaneous TID WC   insulin aspart  10 Units Subcutaneous TID WC   insulin glargine  30 Units Subcutaneous Daily   ipratropium  2 puff Inhalation Q4H   levothyroxine  112 mcg Oral Daily   methylPREDNISolone (SOLU-MEDROL) injection  60 mg Intravenous Q12H    pantoprazole  40 mg Oral Daily   sacubitril-valsartan  1 tablet Oral BID   spironolactone  25 mg Oral Daily   venlafaxine XR  75 mg Oral Q breakfast   zinc sulfate  220 mg Oral Daily   Continuous Infusions:  sodium chloride Stopped (12/26/19 1215)     LOS: 6 days    Time spent: 30 minutes    Barb Merino, MD [facility] Pager [phone]

## 2020-01-01 LAB — BASIC METABOLIC PANEL
Anion gap: 14 (ref 5–15)
BUN: 45 mg/dL — ABNORMAL HIGH (ref 6–20)
CO2: 31 mmol/L (ref 22–32)
Calcium: 9 mg/dL (ref 8.9–10.3)
Chloride: 91 mmol/L — ABNORMAL LOW (ref 98–111)
Creatinine, Ser: 0.96 mg/dL (ref 0.44–1.00)
GFR calc Af Amer: >60 mL/min (ref >60)
GFR calc non Af Amer: >60 mL/min (ref >60)
Glucose, Bld: 188 mg/dL — ABNORMAL HIGH (ref 70–99)
Potassium: 3.7 mmol/L (ref 3.5–5.1)
Sodium: 136 mmol/L (ref 135–145)

## 2020-01-01 LAB — GLUCOSE, CAPILLARY
Glucose-Capillary: 220 mg/dL — ABNORMAL HIGH (ref 70–99)
Glucose-Capillary: 304 mg/dL — ABNORMAL HIGH (ref 70–99)
Glucose-Capillary: 199 mg/dL — ABNORMAL HIGH (ref 70–99)
Glucose-Capillary: 189 mg/dL — ABNORMAL HIGH (ref 70–99)

## 2020-01-01 LAB — BRAIN NATRIURETIC PEPTIDE: B Natriuretic Peptide: 25.9 pg/mL (ref 0.0–100.0)

## 2020-01-01 NOTE — Progress Notes (Signed)
Received at 8L/HFNC saturating at 84-92%, not in any form of distress. Pt reminded to use NRB as needed as well as to do flutter valve to help increase oxygen saturation/lung expansion. Further educated to lie prone when sleeping. Pt verbalized understanding. Will continue to educate and monitor patient.

## 2020-01-01 NOTE — Plan of Care (Signed)

## 2020-01-01 NOTE — Progress Notes (Signed)
PROGRESS NOTE    Rebecca [name]  RCB:[id number] DOB: 1970/06/13 DOA: 12/25/2019 PCP: Elby Showers, MD    Brief Narrative:  Rebecca [name] is a 49 y.o. female with PMH of DM2, HTN, HLD, CAD, breast cancer status post bilateral mastectomy and chemotherapy in remission, chemotherapy-induced cardiomyopathy, hypothyroidism d/t radioactive iodine ablation. Patient presented to the ED on 12/25/2019 with persistent cough, fatigue and weakness, progressively worsening for last 1 week.  She tested positive for COVID-19 as an outpatient in the interval, did not require hospitalization but with progressive symptoms, patient came to the ED on 8/28.  In the ER, patient had temperature of 102.2, heart rate in 90s, respiratory 26, 94% oxygen saturation on room air, blood pressure normal. D-dimer slightly elevated to 0.52, CRP elevated to 6.1 Repeat COVID-19 PCR positive. Chest x-ray showed mild bilateral infiltrates. Patient was admitted for COVID-19 pneumonia.    Assessment & Plan:   Principal Problem:   Acute respiratory disease due to COVID-19 virus Active Problems:   Diabetes mellitus type 2 in obese (HCC)  Acute hypoxemic respiratory failure due to COVID-19 pneumonia: Continue to monitor due to significant symptoms  chest physiotherapy, incentive spirometry, deep breathing exercises, sputum induction, mucolytic's and bronchodilators. Supplemental oxygen to keep saturations more than 85%. Covid directed therapy with , steroids, on high-dose Solu-Medrol remdesivir, competed therapy. Baricitinib, Day 5/14 antibiotics, not indicated Due to severity of symptoms, patient will need daily inflammatory markers, chest x-rays, liver function test to monitor and direct COVID-19 therapies.  COVID-19 Labs  No results for input(s): DDIMER, FERRITIN, LDH, CRP in the last 72 hours.  Lab Results  Component Value Date   SARSCOV2NAA POSITIVE (A) 12/25/2019   SpO2: (!) 89 % O2 Flow Rate  (L/min): 11 L/min FiO2 (%): 86 %  Acute on chronic systolic heart failure: History of chemotherapy-induced cardiomyopathy and hypertension: Ejection fraction 25 to 30%. No clinical evidence of peripheral edema. IV Lasix 40 mg twice a day Started on [place] Added [place] follow-up with cardiology.  Hypothyroidism: Euthyroid on Synthroid replacement.  Breast cancer: In remission.  DVT prophylaxis: Lovenox.  Insulin-dependent type 2 diabetes: With hyperglycemia.  On long-acting insulin and sliding scale insulin.  Also on Tradjenta that we will change to [place].   Code Status: Full code Family Communication: None.  Patient is communicating. Disposition Plan: Status is: Inpatient  Remains inpatient appropriate because:Inpatient level of care appropriate due to severity of illness   Dispo: The patient is from: Home              Anticipated d/c is to: Home              Anticipated d/c date is: 3 days              Patient currently is not medically stable to d/c.         Consultants:   Cardiology, curbside  Procedures:   None  Antimicrobials:  Anti-infectives (From admission, onward)   Start     Dose/Rate Route Frequency Ordered Stop   12/26/19 1000  remdesivir 100 mg in sodium chloride 0.9 % 100 mL IVPB       "Followed by" Linked Group Details   100 mg 200 mL/hr over 30 Minutes Intravenous Daily 12/25/19 0412 12/29/19 1001   12/25/19 0500  remdesivir 200 mg in sodium chloride 0.9% 250 mL IVPB       "Followed by" Linked Group Details   200 mg 580 mL/hr over 30 Minutes Intravenous Once  12/25/19 0412 12/25/19 0915         Subjective: Patient seen and examined.  No overnight events.  She feels okay but is still on 11 L of oxygen.  Blood pressure reported less than 90 systolic overnight and Lasix was held.  Does not feel any dizziness or lightheadedness.  Objective: Vitals:   01/01/20 0405 01/01/20 0824 01/01/20 1007 01/01/20 1021  BP: 101/71 (!)  83/59 (!) 84/58   Pulse: 61 69 73   Resp: 16 20    Temp: 97.7 F (36.5 C) 97.7 F (36.5 C)    TempSrc: Oral Oral    SpO2: (!) 88% (!) 81%  (!) 89%  Weight:      Height:       No intake or output data in the 24 hours ending 01/01/20 1324 Filed Weights   12/25/19 0037  Weight: 111.1 kg    Examination:  General exam: Appears calm and comfortable  In mild distress when talking. Respiratory system: Clear to auscultation. Respiratory effort normal.  No added sounds. Cardiovascular system: S1 & S2 heard, RRR. No JVD, murmurs, rubs, gallops or clicks. No pedal edema. Gastrointestinal system: Abdomen is nondistended, soft and nontender. No organomegaly or masses felt. Normal bowel sounds heard.  Obese and pendulous. Central nervous system: Alert and oriented. No focal neurological deficits. Extremities: Symmetric 5 x 5 power. Skin: No rashes, lesions or ulcers Psychiatry: Judgement and insight appear normal. Mood & affect appropriate.     Data Reviewed: I have personally reviewed following labs and imaging studies  CBC: No results for input(s): WBC, NEUTROABS, HGB, HCT, MCV, PLT in the last 168 hours. Basic Metabolic Panel: Recent Labs  Lab 12/29/19 0422 12/29/19 0830 12/30/19 0436 12/31/19 0431 01/01/20 0449  NA 132* 134* 138 137 136  K 4.5 4.1 4.1 4.0 3.7  CL 99 91* 95* 93* 91*  CO2 18* 27 32 24 31  GLUCOSE 281* 283* 178* 199* 188*  BUN 117* 35* 37* 41* 45*  CREATININE 4.33* 0.96 0.91 0.95 0.96  CALCIUM 7.1* 8.8* 8.9 8.7* 9.0   GFR: Estimated Creatinine Clearance: 94.6 mL/min (by C-G formula based on SCr of 0.96 mg/dL). Liver Function Tests: Recent Labs  Lab 12/29/19 0830  AST 18  ALT 22  ALKPHOS 47  BILITOT 0.7  PROT 6.9  ALBUMIN 3.4*   No results for input(s): LIPASE, AMYLASE in the last 168 hours. No results for input(s): AMMONIA in the last 168 hours. Coagulation Profile: No results for input(s): INR, PROTIME in the last 168 hours. Cardiac  Enzymes: No results for input(s): CKTOTAL, CKMB, CKMBINDEX, TROPONINI in the last 168 hours. BNP (last 3 results) No results for input(s): PROBNP in the last 8760 hours. HbA1C: No results for input(s): HGBA1C in the last 72 hours. CBG: Recent Labs  Lab 12/31/19 1247 12/31/19 1704 12/31/19 2120 01/01/20 0815 01/01/20 1212  GLUCAP 271* 245* 220* 220* 304*   Lipid Profile: No results for input(s): CHOL, HDL, LDLCALC, TRIG, CHOLHDL, LDLDIRECT in the last 72 hours. Thyroid Function Tests: No results for input(s): TSH, T4TOTAL, FREET4, T3FREE, THYROIDAB in the last 72 hours. Anemia Panel: No results for input(s): VITAMINB12, FOLATE, FERRITIN, TIBC, IRON, RETICCTPCT in the last 72 hours. Sepsis Labs: Recent Labs  Lab 12/29/19 0422  LATICACIDVEN 2.2*    Recent Results (from the past 240 hour(s))  SARS Coronavirus 2 by RT PCR (hospital order, performed in [facility] hospital lab) Nasopharyngeal Nasopharyngeal Swab     Status: Abnormal   Collection Time:  12/25/19  1:25 AM   Specimen: Nasopharyngeal Swab  Result Value Ref Range Status   SARS Coronavirus 2 POSITIVE (A) NEGATIVE Final    Comment: CRITICAL RESULT CALLED TO, READ BACK BY AND VERIFIED WITH: NASH,J.,RN @ 0329 12/25/19 TURNER,S. (NOTE) SARS-CoV-2 target nucleic acids are DETECTED  SARS-CoV-2 RNA is generally detectable in upper respiratory specimens  during the acute phase of infection.  Positive results are indicative  of the presence of the identified virus, but do not rule out bacterial infection or co-infection with other pathogens not detected by the test.  Clinical correlation with patient history and  other diagnostic information is necessary to determine patient infection status.  The expected result is negative.  Fact Sheet for Patients:   StrictlyIdeas.no   Fact Sheet for Healthcare Providers:   BankingDealers.co.za    This test is not yet approved or cleared  by the [place] FDA and  has been authorized for detection and/or diagnosis of SARS-CoV-2 by FDA under an Emergency Use Authorization (EUA).  This EUA will remain in effect (meani ng this test can be used) for the duration of  the COVID-19 declaration under Section 564(b)(1) of the Act, 21 U.S.C. section 360-bbb-3(b)(1), unless the authorization is terminated or revoked sooner.  Performed at [facility], [place] [address] [place], [place] [address]          Radiology Studies: No results found.      Scheduled Meds: . vitamin C  500 mg Oral Daily  . baricitinib  4 mg Oral Daily  . carvedilol  6.25 mg Oral BID WC  . cholecalciferol  5,000 Units Oral Daily  . dapagliflozin propanediol  5 mg Oral Daily  . enoxaparin (LOVENOX) injection  55 mg Subcutaneous Q24H  . furosemide  40 mg Intravenous BID  . insulin aspart  0-15 Units Subcutaneous TID WC  . insulin aspart  10 Units Subcutaneous TID WC  . insulin glargine  30 Units Subcutaneous Daily  . ipratropium  2 puff Inhalation Q4H  . levothyroxine  112 mcg Oral Daily  . methylPREDNISolone (SOLU-MEDROL) injection  60 mg Intravenous Q12H  . pantoprazole  40 mg Oral Daily  . sacubitril-valsartan  1 tablet Oral BID  . spironolactone  25 mg Oral Daily  . venlafaxine XR  75 mg Oral Q breakfast  . zinc sulfate  220 mg Oral Daily   Continuous Infusions: . sodium chloride Stopped (12/26/19 1215)     LOS: 7 days    Time spent: 30 minutes    Barb Merino, MD [facility] Pager [phone]

## 2020-01-02 LAB — GLUCOSE, CAPILLARY
Glucose-Capillary: 177 mg/dL — ABNORMAL HIGH (ref 70–99)
Glucose-Capillary: 338 mg/dL — ABNORMAL HIGH (ref 70–99)
Glucose-Capillary: 287 mg/dL — ABNORMAL HIGH (ref 70–99)
Glucose-Capillary: 177 mg/dL — ABNORMAL HIGH (ref 70–99)

## 2020-01-02 MED ORDER — METHYLPREDNISOLONE SODIUM SUCC 125 MG IJ SOLR
40.0000 mg | Freq: Every day | INTRAMUSCULAR | Status: DC
  Administered 2020-01-02 – 2020-01-04 (×3): 40 mg via INTRAVENOUS
  Filled 2020-01-02 (×2): qty 2

## 2020-01-02 NOTE — Plan of Care (Signed)
  Problem: Coping: Goal: Level of anxiety will decrease Outcome: Progressing   Problem: Activity: Goal: Risk for activity intolerance will decrease Outcome: Progressing   Problem: Education: Goal: Knowledge of General Education information will improve Description: Including pain rating scale, medication(s)/side effects and non-pharmacologic comfort measures Outcome: Progressing

## 2020-01-02 NOTE — Plan of Care (Signed)
  Problem: Activity: Goal: Risk for activity intolerance will decrease Outcome: Progressing   Problem: Nutrition: Goal: Adequate nutrition will be maintained Outcome: Progressing   Problem: Coping: Goal: Level of anxiety will decrease Outcome: Progressing   Problem: Safety: Goal: Ability to remain free from injury will improve Outcome: Progressing   

## 2020-01-02 NOTE — Progress Notes (Signed)
PROGRESS NOTE    Rebecca [name]  BHA:[id number] DOB: [birth year] DOA: 12/25/2019 PCP: Elby Showers, MD    Brief Narrative:  Rebecca [name] is a 49 y.o. female with PMH of DM2, HTN, HLD, CAD, breast cancer status post bilateral mastectomy and chemotherapy in remission, chemotherapy-induced cardiomyopathy, hypothyroidism d/t radioactive iodine ablation. Patient presented to the ED on 12/25/2019 with persistent cough, fatigue and weakness, progressively worsening for last 1 week.  She tested positive for COVID-19 as an outpatient in the interval, did not require hospitalization but with progressive symptoms, patient came to the ED on 8/28.  In the ER, patient had temperature of 102.2, heart rate in 90s, respiratory 26, 94% oxygen saturation on room air, blood pressure normal. D-dimer slightly elevated to 0.52, CRP elevated to 6.1 Repeat COVID-19 PCR positive. Chest x-ray showed mild bilateral infiltrates. Patient was admitted for COVID-19 pneumonia. Remains in the hospital with persistent high oxygen requirement.    Assessment & Plan:   Principal Problem:   Acute respiratory disease due to COVID-19 virus Active Problems:   Diabetes mellitus type 2 in obese (HCC)  Acute hypoxemic respiratory failure due to COVID-19 pneumonia: Continue to monitor due to significant symptoms  chest physiotherapy, incentive spirometry, deep breathing exercises, sputum induction, mucolytic's and bronchodilators. Supplemental oxygen to keep saturations more than 85%. Covid directed therapy with , steroids, on high-dose Solu-Medrol Will taper off high-dose steroids.  She has improved inflammatory markers. remdesivir, competed therapy. Baricitinib, Day 6/14 antibiotics, not indicated Due to severity of symptoms, patient will need daily inflammatory markers, chest x-rays, liver function test to monitor and direct COVID-19 therapies.  COVID-19 Labs  No results for input(s): DDIMER, FERRITIN,  LDH, CRP in the last 72 hours.  Lab Results  Component Value Date   SARSCOV2NAA POSITIVE (A) 12/25/2019   SpO2: 94 % O2 Flow Rate (L/min): 11 L/min FiO2 (%): 86 %  Acute on chronic systolic heart failure: History of chemotherapy-induced cardiomyopathy and hypertension: Ejection fraction 25 to 30%. No clinical evidence of peripheral edema. IV Lasix 40 mg twice a day Started on Entresto Added [place] follow-up with cardiology.  Hypothyroidism: Euthyroid on Synthroid replacement.  Breast cancer: In remission.  DVT prophylaxis: Lovenox.  Insulin-dependent type 2 diabetes: With hyperglycemia.  On long-acting insulin and sliding scale insulin.  Also on Farxiga.   Code Status: Full code Family Communication: None.  Patient is communicating. Disposition Plan: Status is: Inpatient  Remains inpatient appropriate because:Inpatient level of care appropriate due to severity of illness   Dispo: The patient is from: Home              Anticipated d/c is to: Home              Anticipated d/c date is: 2 to 3 days.  When oxygen requirement improves less than 6 L.              Patient currently is not medically stable to d/c.         Consultants:   Cardiology, curbside  Procedures:   None  Antimicrobials:  Anti-infectives (From admission, onward)   Start     Dose/Rate Route Frequency Ordered Stop   12/26/19 1000  remdesivir 100 mg in sodium chloride 0.9 % 100 mL IVPB       "Followed by" Linked Group Details   100 mg 200 mL/hr over 30 Minutes Intravenous Daily 12/25/19 0412 12/29/19 1001   12/25/19 0500  remdesivir 200 mg in sodium chloride 0.9% 250  mL IVPB       "Followed by" Linked Group Details   200 mg 580 mL/hr over 30 Minutes Intravenous Once 12/25/19 6301 12/25/19 0915         Subjective: Seen and examined.  No overnight events.  No more dizziness.  Motor strength walking around.  Is still on 8 to 9 L of oxygen.  Eager to go  home.  Objective: Vitals:   01/01/20 1007 01/01/20 1021 01/01/20 2139 01/02/20 0552  BP: (!) 84/58  (!) 84/59 118/70  Pulse: 73  82 60  Resp:   18 18  Temp:   98.3 F (36.8 C) 97.6 F (36.4 C)  TempSrc:   Oral Axillary  SpO2:  (!) 89% (!) 87% 94%  Weight:      Height:       No intake or output data in the 24 hours ending 01/02/20 1105 Filed Weights   12/25/19 0037  Weight: 111.1 kg    Examination:  General exam: Appears calm and comfortable  Not in any distress at rest.  Short of breath on walking. Respiratory system: Clear to auscultation. Respiratory effort normal.  No added sounds.  No crackles or wheezing. Cardiovascular system: S1 & S2 heard, RRR. No JVD, murmurs, rubs, gallops or clicks. No pedal edema. Gastrointestinal system: Abdomen is nondistended, soft and nontender. No organomegaly or masses felt. Normal bowel sounds heard.  Obese and pendulous. Central nervous system: Alert and oriented. No focal neurological deficits. Extremities: Symmetric 5 x 5 power. Skin: No rashes, lesions or ulcers Psychiatry: Judgement and insight appear normal. Mood & affect appropriate.     Data Reviewed: I have personally reviewed following labs and imaging studies  CBC: No results for input(s): WBC, NEUTROABS, HGB, HCT, MCV, PLT in the last 168 hours. Basic Metabolic Panel: Recent Labs  Lab 12/29/19 0422 12/29/19 0830 12/30/19 0436 12/31/19 0431 01/01/20 0449  NA 132* 134* 138 137 136  K 4.5 4.1 4.1 4.0 3.7  CL 99 91* 95* 93* 91*  CO2 18* 27 32 24 31  GLUCOSE 281* 283* 178* 199* 188*  BUN 117* 35* 37* 41* 45*  CREATININE 4.33* 0.96 0.91 0.95 0.96  CALCIUM 7.1* 8.8* 8.9 8.7* 9.0   GFR: Estimated Creatinine Clearance: 94.6 mL/min (by C-G formula based on SCr of 0.96 mg/dL). Liver Function Tests: Recent Labs  Lab 12/29/19 0830  AST 18  ALT 22  ALKPHOS 47  BILITOT 0.7  PROT 6.9  ALBUMIN 3.4*   No results for input(s): LIPASE, AMYLASE in the last 168 hours. No  results for input(s): AMMONIA in the last 168 hours. Coagulation Profile: No results for input(s): INR, PROTIME in the last 168 hours. Cardiac Enzymes: No results for input(s): CKTOTAL, CKMB, CKMBINDEX, TROPONINI in the last 168 hours. BNP (last 3 results) No results for input(s): PROBNP in the last 8760 hours. HbA1C: No results for input(s): HGBA1C in the last 72 hours. CBG: Recent Labs  Lab 01/01/20 0815 01/01/20 1212 01/01/20 1915 01/01/20 2139 01/02/20 0739  GLUCAP 220* 304* 199* 189* 177*   Lipid Profile: No results for input(s): CHOL, HDL, LDLCALC, TRIG, CHOLHDL, LDLDIRECT in the last 72 hours. Thyroid Function Tests: No results for input(s): TSH, T4TOTAL, FREET4, T3FREE, THYROIDAB in the last 72 hours. Anemia Panel: No results for input(s): VITAMINB12, FOLATE, FERRITIN, TIBC, IRON, RETICCTPCT in the last 72 hours. Sepsis Labs: Recent Labs  Lab 12/29/19 0422  LATICACIDVEN 2.2*    Recent Results (from the past 240 hour(s))  SARS Coronavirus  2 by RT PCR (hospital order, performed in [facility] hospital lab) Nasopharyngeal Nasopharyngeal Swab     Status: Abnormal   Collection Time: 12/25/19  1:25 AM   Specimen: Nasopharyngeal Swab  Result Value Ref Range Status   SARS Coronavirus 2 POSITIVE (A) NEGATIVE Final    Comment: CRITICAL RESULT CALLED TO, READ BACK BY AND VERIFIED WITH: NASH,J.,RN @ 0329 12/25/19 TURNER,S. (NOTE) SARS-CoV-2 target nucleic acids are DETECTED  SARS-CoV-2 RNA is generally detectable in upper respiratory specimens  during the acute phase of infection.  Positive results are indicative  of the presence of the identified virus, but do not rule out bacterial infection or co-infection with other pathogens not detected by the test.  Clinical correlation with patient history and  other diagnostic information is necessary to determine patient infection status.  The expected result is negative.  Fact Sheet for Patients:    StrictlyIdeas.no   Fact Sheet for Healthcare Providers:   BankingDealers.co.za    This test is not yet approved or cleared by the [place] FDA and  has been authorized for detection and/or diagnosis of SARS-CoV-2 by FDA under an Emergency Use Authorization (EUA).  This EUA will remain in effect (meani ng this test can be used) for the duration of  the COVID-19 declaration under Section 564(b)(1) of the Act, 21 U.S.C. section 360-bbb-3(b)(1), unless the authorization is terminated or revoked sooner.  Performed at [facility], [place] [address] [place], [place] [address]          Radiology Studies: No results found.      Scheduled Meds: . vitamin C  500 mg Oral Daily  . baricitinib  4 mg Oral Daily  . carvedilol  6.25 mg Oral BID WC  . cholecalciferol  5,000 Units Oral Daily  . dapagliflozin propanediol  5 mg Oral Daily  . enoxaparin (LOVENOX) injection  55 mg Subcutaneous Q24H  . furosemide  40 mg Intravenous BID  . insulin aspart  0-15 Units Subcutaneous TID WC  . insulin aspart  10 Units Subcutaneous TID WC  . insulin glargine  30 Units Subcutaneous Daily  . ipratropium  2 puff Inhalation Q4H  . levothyroxine  112 mcg Oral Daily  . methylPREDNISolone (SOLU-MEDROL) injection  40 mg Intravenous Daily  . pantoprazole  40 mg Oral Daily  . sacubitril-valsartan  1 tablet Oral BID  . spironolactone  25 mg Oral Daily  . venlafaxine XR  75 mg Oral Q breakfast  . zinc sulfate  220 mg Oral Daily   Continuous Infusions: . sodium chloride Stopped (12/26/19 1215)     LOS: 8 days    Time spent: 30 minutes    Barb Merino, MD [facility] Pager [phone]

## 2020-01-03 LAB — BASIC METABOLIC PANEL
Anion gap: 11 (ref 5–15)
BUN: 36 mg/dL — ABNORMAL HIGH (ref 6–20)
CO2: 30 mmol/L (ref 22–32)
Calcium: 9.1 mg/dL (ref 8.9–10.3)
Chloride: 95 mmol/L — ABNORMAL LOW (ref 98–111)
Creatinine, Ser: 1.08 mg/dL — ABNORMAL HIGH (ref 0.44–1.00)
GFR calc Af Amer: >60 mL/min (ref >60)
GFR calc non Af Amer: 60 mL/min — ABNORMAL LOW (ref >60)
Glucose, Bld: 137 mg/dL — ABNORMAL HIGH (ref 70–99)
Potassium: 3.9 mmol/L (ref 3.5–5.1)
Sodium: 136 mmol/L (ref 135–145)

## 2020-01-03 LAB — GLUCOSE, CAPILLARY
Glucose-Capillary: 102 mg/dL — ABNORMAL HIGH (ref 70–99)
Glucose-Capillary: 122 mg/dL — ABNORMAL HIGH (ref 70–99)
Glucose-Capillary: 182 mg/dL — ABNORMAL HIGH (ref 70–99)

## 2020-01-03 MED ORDER — FUROSEMIDE 40 MG PO TABS
40.0000 mg | ORAL_TABLET | Freq: Two times a day (BID) | ORAL | Status: DC
  Administered 2020-01-03 – 2020-01-04 (×2): 40 mg via ORAL
  Filled 2020-01-03 (×2): qty 1

## 2020-01-03 NOTE — Progress Notes (Signed)
SATURATION QUALIFICATIONS: (This note is used to comply with regulatory documentation for home oxygen)  Patient Saturations on Room Air at Rest = 84%  Patient Saturations on Room Air while Ambulating = 80%  Patient Saturations on 8 Liters of oxygen while Ambulating =89%  Please briefly explain why patient needs home oxygen:

## 2020-01-03 NOTE — TOC Progression Note (Addendum)
Transition of Care [facility]) - Progression Note    Patient Details  Name: Rebecca [name] MRN: [id number] Date of Birth: [birth year]  Transition of Care [facility]) CM/SW Contact  Purcell Mouton, RN Phone Number: 01/03/2020, 11:02 AM  Clinical Narrative:    Spoke with pt concerning not discharging today related to O2 level when walking Sats in the 80's. Pt states okay. O2 from [employer], referral given to in house rep.    Expected Discharge Plan: Home/Self Care Barriers to Discharge: No Barriers Identified  Expected Discharge Plan and Services Expected Discharge Plan: Home/Self Care       Living arrangements for the past 2 months: Single Family Home                                       Social Determinants of Health (SDOH) Interventions    Readmission Risk Interventions No flowsheet data found.

## 2020-01-03 NOTE — Progress Notes (Signed)
  While nurse tech is in the room assisting pt to use the [facility], pt got emotional and frustrated then fell on the floor while standing. The pt is fine  no injury noted from the fall. She said she was just very frustrated  for being here in the hospital for long time. She said already made plans to go home today and called her husband to pick her up anticipating she will go home. Pt was upset about due to oxygen requirement barriers for discharge. MD notified about the fall and pt call her  And updated her husband about what happened.  Pt was able to calm down and agreeable to stay. She verbalized understanding about not being ready to go home yet.

## 2020-01-03 NOTE — Progress Notes (Signed)
PROGRESS NOTE    Rebecca [name]  DJS:[id number] DOB: 03/21/1971 DOA: 12/25/2019 PCP: Elby Showers, MD    Brief Narrative:  Rebecca [name] is a 49 y.o. female with PMH of DM2, HTN, HLD, CAD, breast cancer status post bilateral mastectomy and chemotherapy in remission, chemotherapy-induced cardiomyopathy, hypothyroidism d/t radioactive iodine ablation. Patient presented to the ED on 12/25/2019 with persistent cough, fatigue and weakness, progressively worsening for last 1 week.  She tested positive for COVID-19 as an outpatient in the interval, did not require hospitalization but with progressive symptoms, patient came to the ED on 8/28.  In the ER, patient had temperature of 102.2, heart rate in 90s, respiratory 26, 94% oxygen saturation on room air, blood pressure normal. D-dimer slightly elevated to 0.52, CRP elevated to 6.1 Repeat COVID-19 PCR positive. Chest x-ray showed mild bilateral infiltrates. Patient was admitted for COVID-19 pneumonia. Remains in the hospital with persistent high oxygen requirement.    Assessment & Plan:   Principal Problem:   Acute respiratory disease due to COVID-19 virus Active Problems:   Diabetes mellitus type 2 in obese (HCC)  Acute hypoxemic respiratory failure due to COVID-19 pneumonia: Continue to monitor due to significant symptoms  chest physiotherapy, incentive spirometry, deep breathing exercises, sputum induction, mucolytic's and bronchodilators. Supplemental oxygen to keep saturations more than 85%. Covid directed therapy with , steroids, on high-dose Solu-Medrol, clinically improving.  Will taper off.  Improved inflammatory markers. remdesivir, competed therapy. Baricitinib, Day 7/14 antibiotics, not indicated Due to severity of symptoms, patient will need daily inflammatory markers, chest x-rays, liver function test to monitor and direct COVID-19 therapies.  COVID-19 Labs  No results for input(s): DDIMER, FERRITIN,  LDH, CRP in the last 72 hours.  Lab Results  Component Value Date   SARSCOV2NAA POSITIVE (A) 12/25/2019   SpO2: (!) 86 % O2 Flow Rate (L/min): 6 L/min FiO2 (%): 86 %  Acute on chronic systolic heart failure: History of chemotherapy-induced cardiomyopathy and hypertension: Ejection fraction 25 to 30%. No clinical evidence of peripheral edema. IV Lasix 40 mg twice a day Change to oral Lasix.  Most of the time not given because of low blood pressures. Started on Entresto Added [place] follow-up with cardiology.  Hypothyroidism: Euthyroid on Synthroid replacement.  Breast cancer: In remission.  DVT prophylaxis: Lovenox.  Insulin-dependent type 2 diabetes: With hyperglycemia.  On long-acting insulin and sliding scale insulin.  Also on Farxiga.   Code Status: Full code Family Communication: None.  Patient is communicating. Disposition Plan: Status is: Inpatient  Remains inpatient appropriate because:Inpatient level of care appropriate due to severity of illness   Dispo: The patient is from: Home              Anticipated d/c is to: Home              Anticipated d/c date is: When oxygen requirement is less than 6 L.              Patient currently is not medically stable to d/c.         Consultants:   Cardiology, curbside  Procedures:   None  Antimicrobials:  Anti-infectives (From admission, onward)   Start     Dose/Rate Route Frequency Ordered Stop   12/26/19 1000  remdesivir 100 mg in sodium chloride 0.9 % 100 mL IVPB       "Followed by" Linked Group Details   100 mg 200 mL/hr over 30 Minutes Intravenous Daily 12/25/19 0412 12/29/19 1001  12/25/19 0500  remdesivir 200 mg in sodium chloride 0.9% 250 mL IVPB       "Followed by" Linked Group Details   200 mg 580 mL/hr over 30 Minutes Intravenous Once 12/25/19 1191 12/25/19 0915         Subjective: Patient seen and examined.  No overnight events.  Still on more than 6 L of oxygen.  She required  more than 8 L on mobility to keep saturation more than 85%. She is very frustrated and desperate to go home.  Objective: Vitals:   01/02/20 2200 01/03/20 0518 01/03/20 0829 01/03/20 1050  BP: 115/79 126/85 (!) 94/54 101/81  Pulse: 67 (!) 57  69  Resp: 20 20    Temp: 98.5 F (36.9 C) 97.7 F (36.5 C)    TempSrc: Oral Oral    SpO2: 92% 91% (!) 85% (!) 86%  Weight:      Height:        Intake/Output Summary (Last 24 hours) at 01/03/2020 1101 Last data filed at 01/02/2020 1700 Gross per 24 hour  Intake 240 ml  Output 800 ml  Net -560 ml   Filed Weights   12/25/19 0037  Weight: 111.1 kg    Examination:  General exam: Appears calm and comfortable  Respiratory system: Clear to auscultation. Respiratory effort normal.  No added sounds.  No crackles or wheezing. Cardiovascular system: S1 & S2 heard, RRR. No JVD, murmurs, rubs, gallops or clicks. No pedal edema. Gastrointestinal system: Abdomen is nondistended, soft and nontender. No organomegaly or masses felt. Normal bowel sounds heard.  Obese and pendulous. Central nervous system: Alert and oriented. No focal neurological deficits. Extremities: Symmetric 5 x 5 power. Skin: No rashes, lesions or ulcers Psychiatry: Judgement and insight appear normal. Mood & affect normal.    Data Reviewed: I have personally reviewed following labs and imaging studies  CBC: No results for input(s): WBC, NEUTROABS, HGB, HCT, MCV, PLT in the last 168 hours. Basic Metabolic Panel: Recent Labs  Lab 12/29/19 0830 12/30/19 0436 12/31/19 0431 01/01/20 0449 01/03/20 0557  NA 134* 138 137 136 136  K 4.1 4.1 4.0 3.7 3.9  CL 91* 95* 93* 91* 95*  CO2 27 32 24 31 30   GLUCOSE 283* 178* 199* 188* 137*  BUN 35* 37* 41* 45* 36*  CREATININE 0.96 0.91 0.95 0.96 1.08*  CALCIUM 8.8* 8.9 8.7* 9.0 9.1   GFR: Estimated Creatinine Clearance: 84.1 mL/min (A) (by C-G formula based on SCr of 1.08 mg/dL (H)). Liver Function Tests: Recent Labs  Lab  12/29/19 0830  AST 18  ALT 22  ALKPHOS 47  BILITOT 0.7  PROT 6.9  ALBUMIN 3.4*   No results for input(s): LIPASE, AMYLASE in the last 168 hours. No results for input(s): AMMONIA in the last 168 hours. Coagulation Profile: No results for input(s): INR, PROTIME in the last 168 hours. Cardiac Enzymes: No results for input(s): CKTOTAL, CKMB, CKMBINDEX, TROPONINI in the last 168 hours. BNP (last 3 results) No results for input(s): PROBNP in the last 8760 hours. HbA1C: No results for input(s): HGBA1C in the last 72 hours. CBG: Recent Labs  Lab 01/02/20 0739 01/02/20 1115 01/02/20 1638 01/02/20 2158 01/03/20 0754  GLUCAP 177* 338* 287* 177* 102*   Lipid Profile: No results for input(s): CHOL, HDL, LDLCALC, TRIG, CHOLHDL, LDLDIRECT in the last 72 hours. Thyroid Function Tests: No results for input(s): TSH, T4TOTAL, FREET4, T3FREE, THYROIDAB in the last 72 hours. Anemia Panel: No results for input(s): VITAMINB12, FOLATE, FERRITIN, TIBC,  IRON, RETICCTPCT in the last 72 hours. Sepsis Labs: Recent Labs  Lab 12/29/19 0422  LATICACIDVEN 2.2*    Recent Results (from the past 240 hour(s))  SARS Coronavirus 2 by RT PCR (hospital order, performed in [facility] hospital lab) Nasopharyngeal Nasopharyngeal Swab     Status: Abnormal   Collection Time: 12/25/19  1:25 AM   Specimen: Nasopharyngeal Swab  Result Value Ref Range Status   SARS Coronavirus 2 POSITIVE (A) NEGATIVE Final    Comment: CRITICAL RESULT CALLED TO, READ BACK BY AND VERIFIED WITH: NASH,J.,RN @ 0329 12/25/19 TURNER,S. (NOTE) SARS-CoV-2 target nucleic acids are DETECTED  SARS-CoV-2 RNA is generally detectable in upper respiratory specimens  during the acute phase of infection.  Positive results are indicative  of the presence of the identified virus, but do not rule out bacterial infection or co-infection with other pathogens not detected by the test.  Clinical correlation with patient history and  other diagnostic  information is necessary to determine patient infection status.  The expected result is negative.  Fact Sheet for Patients:   StrictlyIdeas.no   Fact Sheet for Healthcare Providers:   BankingDealers.co.za    This test is not yet approved or cleared by the [place] FDA and  has been authorized for detection and/or diagnosis of SARS-CoV-2 by FDA under an Emergency Use Authorization (EUA).  This EUA will remain in effect (meani ng this test can be used) for the duration of  the COVID-19 declaration under Section 564(b)(1) of the Act, 21 U.S.C. section 360-bbb-3(b)(1), unless the authorization is terminated or revoked sooner.  Performed at [facility], [place] [address] [place], [place] [address]          Radiology Studies: No results found.      Scheduled Meds: . vitamin C  500 mg Oral Daily  . baricitinib  4 mg Oral Daily  . carvedilol  6.25 mg Oral BID WC  . cholecalciferol  5,000 Units Oral Daily  . dapagliflozin propanediol  5 mg Oral Daily  . enoxaparin (LOVENOX) injection  55 mg Subcutaneous Q24H  . furosemide  40 mg Oral BID  . insulin aspart  0-15 Units Subcutaneous TID WC  . insulin aspart  10 Units Subcutaneous TID WC  . insulin glargine  30 Units Subcutaneous Daily  . ipratropium  2 puff Inhalation Q4H  . levothyroxine  112 mcg Oral Daily  . methylPREDNISolone (SOLU-MEDROL) injection  40 mg Intravenous Daily  . pantoprazole  40 mg Oral Daily  . sacubitril-valsartan  1 tablet Oral BID  . spironolactone  25 mg Oral Daily  . venlafaxine XR  75 mg Oral Q breakfast  . zinc sulfate  220 mg Oral Daily   Continuous Infusions: . sodium chloride Stopped (12/26/19 1215)     LOS: 9 days    Time spent: 30 minutes    Barb Merino, MD [facility] Pager [phone]

## 2020-01-04 LAB — BASIC METABOLIC PANEL
Anion gap: 10 (ref 5–15)
BUN: 29 mg/dL — ABNORMAL HIGH (ref 6–20)
CO2: 32 mmol/L (ref 22–32)
Calcium: 9.1 mg/dL (ref 8.9–10.3)
Chloride: 96 mmol/L — ABNORMAL LOW (ref 98–111)
Creatinine, Ser: 0.94 mg/dL (ref 0.44–1.00)
GFR calc Af Amer: >60 mL/min (ref >60)
GFR calc non Af Amer: >60 mL/min (ref >60)
Glucose, Bld: 208 mg/dL — ABNORMAL HIGH (ref 70–99)
Potassium: 5.3 mmol/L — ABNORMAL HIGH (ref 3.5–5.1)
Sodium: 138 mmol/L (ref 135–145)

## 2020-01-04 LAB — GLUCOSE, CAPILLARY
Glucose-Capillary: 186 mg/dL — ABNORMAL HIGH (ref 70–99)
Glucose-Capillary: 214 mg/dL — ABNORMAL HIGH (ref 70–99)

## 2020-01-04 MED ORDER — IPRATROPIUM BROMIDE HFA 17 MCG/ACT IN AERS: 2.0000 | INHALATION_SPRAY | RESPIRATORY_TRACT | 1 refills | Status: DC

## 2020-01-04 MED ORDER — CARVEDILOL 6.25 MG PO TABS: 12.5000 mg | ORAL_TABLET | Freq: Two times a day (BID) | ORAL | 0 refills | Status: DC

## 2020-01-04 MED ORDER — FUROSEMIDE 40 MG PO TABS
40.0000 mg | ORAL_TABLET | Freq: Every day | ORAL | 0 refills | Status: DC
Start: 2020-01-04 — End: 2020-01-24

## 2020-01-04 MED ORDER — PREDNISONE 10 MG PO TABS: ORAL_TABLET | ORAL | 0 refills | Status: DC

## 2020-01-04 MED ORDER — ENTRESTO 49-51 MG PO TABS: 1.0000 | ORAL_TABLET | Freq: Two times a day (BID) | ORAL | 1 refills | Status: DC

## 2020-01-04 MED ORDER — DAPAGLIFLOZIN PROPANEDIOL 5 MG PO TABS
5.0000 mg | ORAL_TABLET | Freq: Every day | ORAL | Status: DC
Start: 2020-01-05 — End: 2020-01-04

## 2020-01-04 MED ORDER — DAPAGLIFLOZIN PROPANEDIOL 5 MG PO TABS
5.0000 mg | ORAL_TABLET | Freq: Every day | ORAL | Status: DC
Start: 2020-01-05 — End: 2020-01-24

## 2020-01-04 NOTE — Discharge Summary (Signed)
Physician Discharge Summary  Rebecca [name] ZLD:[id number] DOB: 03-23-1971 DOA: 12/25/2019  PCP: Elby Showers, MD  Admit date: 12/25/2019 Discharge date: 01/04/2020  Admitted From: Home Disposition: Home  Recommendations for Outpatient Follow-up:  1. Follow up with PCP in 1-2 weeks 2. Please obtain BMP/CBC in one week  Home Health: None Equipment/Devices: Oxygen, up to 6 L on mobility  Discharge Condition: Stable CODE STATUS: Full code Diet recommendation: Low-salt and low-carb diet  Discharge summary: 49 year old female with history of type 2 diabetes on Metformin, hypertension, hyperlipidemia, breast cancer status post bilateral mastectomy and chemotherapy in remission, chemotherapy-induced cardiomyopathy, hypothyroidism presented to the ER on 8/28 with persistent cough, fatigue weakness for 1 week. Initially in the emergency room, temperature 102.2, 94% on room air, chest x-ray with bilateral infiltrates.  Subsequently she started needing more and more oxygen and was admitted to the hospital.  Patient is not vaccinated against COVID-19 virus.   Acute hypoxemic respiratory failure due to COVID-19 pneumonia, underlying nonischemic cardiomyopathy with chronic systolic failure with known ejection fraction of 25%. -Patient was treated with supportive treatment as well as COVID-19 directed therapy with high-dose steroids, remdesivir for 5 days and baricitinib until discharge. -Patient has clinical recovery and symptomatically improved, however still remains on 6 L of oxygen.  At one point, she was on 15 L of oxygen supplements. -Was also treated with IV diuresis, as per cardiology recommendation, restarted on Entresto and beta-blockers along with Aldactone.  Repeat ejection fraction was 25 to 30%.  Plan: Symptomatically improved, however still hypoxemic and requiring supplemental oxygen.  Has adequate support at home and oxygen could be arranged to go home.  She will go home and  recover. Was on high-dose steroid for last 11 days, will gradually taper off on prednisone for next 3 days. Symptomatic treatment, inhalers prescribed. Has history of steroid-induced hyperglycemia, A1c is 8.  She was covered with insulin in the hospital, however want to watch and monitor off steroids to see how she does.  Metformin was stopped.  Wilder Glade was prescribed due to heart failure.  She will monitor her blood sugars at home and keep a logbook and bring it to follow-up visit at PCP office.  If remains hyperglycemic despite coming off the steroids, she will need additional oral hypoglycemic agent. She was followed by cardiology who recommended to resume Entresto 49-51, decrease Coreg 6.125 twice a day, Lasix 40 mg daily and Farxiga 5 mg daily. Will need repeat BMP in 1 week and I have communicated this with her cardiologist. Patient is on thyroid replacement and is euthyroid.   Discharge Diagnoses:  Principal Problem:   Acute respiratory disease due to COVID-19 virus Active Problems:   Diabetes mellitus type 2 in obese [facility])    Discharge Instructions  Discharge Instructions    Call MD for:  difficulty breathing, headache or visual disturbances   Complete by: As directed    Call MD for:  extreme fatigue   Complete by: As directed    Call MD for:  persistant dizziness or light-headedness   Complete by: As directed    Call MD for:  temperature >100.4   Complete by: As directed    Diet - low sodium heart healthy   Complete by: As directed    Diet Carb Modified   Complete by: As directed    Discharge instructions   Complete by: As directed    Total isolation for 3 weeks since her positive test. Can use over-the-counter cough medications. Keep a  logbook of blood sugars at home and bring it to your doctor's visit. Maintain supplemental oxygen for your comfort and to keep saturations more than 85%.   Increase activity slowly   Complete by: As directed      Allergies as of  01/04/2020   No Known Allergies     Medication List    STOP taking these medications   azithromycin 250 MG tablet Commonly known as: ZITHROMAX   hydrochlorothiazide 12.5 MG tablet Commonly known as: HYDRODIURIL   metFORMIN 500 MG 24 hr tablet Commonly known as: GLUCOPHAGE-XR   naproxen sodium 220 MG tablet Commonly known as: ALEVE     TAKE these medications   carvedilol 6.25 MG tablet Commonly known as: COREG Take 2 tablets (12.5 mg total) by mouth 2 (two) times daily. What changed: medication strength   cetirizine 10 MG tablet Commonly known as: ZYRTEC Take 10 mg by mouth daily.   dapagliflozin propanediol 5 MG Tabs tablet Commonly known as: FARXIGA Take 1 tablet (5 mg total) by mouth daily. Start taking on: January 05, 2020   diphenhydrAMINE 25 MG tablet Commonly known as: BENADRYL Take 25 mg by mouth every 6 (six) hours as needed for itching or allergies.   Entresto 49-51 MG Generic drug: sacubitril-valsartan Take 1 tablet by mouth 2 (two) times daily.   EPINEPHrine 0.3 mg/0.3 mL Soaj injection Commonly known as: EPI-PEN Use as directed for life threatening allergic reactions only What changed:   how much to take  how to take this  when to take this  reasons to take this   esomeprazole 40 MG capsule Commonly known as: NEXIUM TAKE 1 CAPSULE (40 MG TOTAL) BY MOUTH DAILY AT 12 NOON.   furosemide 40 MG tablet Commonly known as: LASIX Take 1 tablet (40 mg total) by mouth daily.   HYDROcodone-homatropine 5-1.5 MG/5ML syrup Commonly known as: HYCODAN Take 5 mLs by mouth every 8 (eight) hours as needed for cough.   ipratropium 17 MCG/ACT inhaler Commonly known as: ATROVENT HFA Inhale 2 puffs into the lungs every 4 (four) hours.   levothyroxine 112 MCG tablet Commonly known as: SYNTHROID Take 112 mcg by mouth daily.   LORazepam 1 MG tablet Commonly known as: ATIVAN One po bid prn What changed:   how much to take  how to take this  when to  take this  reasons to take this  additional instructions   montelukast 10 MG tablet Commonly known as: SINGULAIR TAKE 1 TABLET BY MOUTH EVERYDAY AT BEDTIME What changed: See the new instructions.   MULTIVITAMIN PO Take 1 tablet by mouth daily. Multivitamin + Omega-3   ondansetron 4 MG disintegrating tablet Commonly known as: Zofran ODT Take 1 tablet (4 mg total) by mouth every 8 (eight) hours as needed for nausea or vomiting.   ondansetron 4 MG tablet Commonly known as: Zofran Take 1 tablet (4 mg total) by mouth every 8 (eight) hours as needed for nausea or vomiting.   predniSONE 10 MG tablet Commonly known as: DELTASONE 3 tabs for 1 day 2 tabs for 1 day 1 tabs for 1 day   rosuvastatin 20 MG tablet Commonly known as: CRESTOR TAKE 1 TABLET (20 MG TOTAL) BY MOUTH DAILY. MUST BE SEEN FOR FURTHER REFILLS What changed: additional instructions   venlafaxine XR 75 MG 24 hr capsule Commonly known as: EFFEXOR-XR TAKE 1 CAPSULE (75 MG TOTAL) BY MOUTH DAILY WITH BREAKFAST.   VITAMIN C GUMMIE PO Take 2 tablets by mouth daily.   VITAMIN  D3 GUMMIES PO Take 5,000 Units by mouth daily.            Durable Medical Equipment  (From admission, onward)         Start     Ordered   01/04/20 1315  For home use only DME oxygen  Once       Comments: Patient Saturations on 5L at Rest = 91%  Patient Saturations on 6L while Ambulating = 86%  Patient Saturations on 6L Liters of oxygen while Ambulating = 86%  Please briefly explain why patient needs home oxygen:patient with increased oxygen demand with exertion  Question Answer Comment  Length of Need 6 Months   Mode or (Route) Nasal cannula   Liters per Minute 6   Frequency Continuous (stationary and portable oxygen unit needed)   Oxygen conserving device Yes   Oxygen delivery system Gas      01/04/20 1314          Follow-up Information    Baxley, Cresenciano Lick, MD Follow up in 1 week(s).   Specialty: Internal Medicine Why:  wil need repeat BMP  Contact information: [address] [place] [address] [phone]        Adrian Prows, MD Follow up in 2 week(s).   Specialty: Cardiology Contact information: [place] [address] [phone]              No Known Allergies  Consultations:  Cardiology, curbside   Procedures/Studies: DG CHEST PORT 1 VIEW  Result Date: 12/28/2019 CLINICAL DATA:  History of bilateral mastectomies. Chemotherapy induced cardiomyopathy. EXAM: PORTABLE CHEST 1 VIEW COMPARISON:  12/25/2019.  Chest CT 03/29/2019. FINDINGS: Thoracic aorta is again noted be tortuous. Stable cardiomegaly. Progressive diffuse bilateral interstitial prominence suggesting CHF. Pneumonitis cannot be excluded. No prominent pleural effusion. No pneumothorax. IMPRESSION: 1.  Stable cardiomegaly. 2. Progressive diffuse bilateral interstitial prominence suggesting CHF. Pneumonitis cannot be excluded. Electronically Signed   By: Marcello Moores  Register   On: 12/28/2019 09:55   DG Chest Portable 1 View  Result Date: 12/25/2019 CLINICAL DATA:  Chest pain COVID EXAM: PORTABLE CHEST 1 VIEW COMPARISON:  07/15/2017 FINDINGS: Mild cardiomegaly with central vascular congestion. Streaky bilateral left greater than right opacities. No pleural effusion or pneumothorax IMPRESSION: 1. Streaky left greater than right lung opacities, probably reflecting bilateral pneumonia 2. Cardiomegaly with mild central congestion Electronically Signed   By: Donavan Foil M.D.   On: 12/25/2019 01:22   US BREAST LTD UNI RIGHT INC AXILLA  Result Date: 12/21/2019 CLINICAL DATA:  Patient for short-term follow-up palpable lump outer right breast. Patient has history of mastectomy and implant reconstruction. EXAM: ULTRASOUND OF THE RIGHT BREAST COMPARISON:  Previous exam(s). FINDINGS: Targeted ultrasound is performed, showing a stable 11 x 6 x 6 mm oval circumscribed hypoechoic mass at the site of palpable concern  right breast 8:30 o'clock 8 cm from nipple. The adjacent 14 x 5 x 24 mm oval circumscribed hypoechoic mass is also stable, previously measuring 25 x 6 x 16 mm. IMPRESSION: Stable findings within the right breast at the site of palpable concern favored to represent fat necrosis. RECOMMENDATION: Patient is instructed to return in 2 months to reassess the palpable area of concern within the right breast. At this time, patient would be due for spot compression mammography as well as ultrasound. I have discussed the findings and recommendations with the patient. If applicable, a reminder letter will be sent to the patient regarding the next appointment. BI-RADS CATEGORY  3: Probably benign. Electronically Signed   By: Lovey Newcomer M.D.   On: 12/21/2019 09:17   ECHOCARDIOGRAM COMPLETE  Result Date: 12/30/2019    ECHOCARDIOGRAM REPORT   Patient Name:   Rebecca [name] Date of Exam: 12/29/2019 Medical Rec #:  [id number]            Height:       70.0 in Accession #:    [id number]           Weight:       245.0 lb Date of Birth:  July 08, 1970            BSA:          2.275 m Patient Age:    74 years             BP:           121/83 mmHg Patient Gender: F                    HR:           70 bpm. Exam Location:  Inpatient Procedure: 2D Echo, Cardiac Doppler, Color Doppler and Intracardiac            Opacification Agent Indications:     Abnormal ECG 794.31 / R94.31  History:         Patient has prior history of Echocardiogram examinations, most                  recent 09/29/2019. CAD, Arrythmias:LBBB; Risk                  Factors:Hypertension, Dyslipidemia, Diabetes and GERD.  Sonographer:     Bernadene Person RDCS Referring Phys:  [id number] Terrilee Croak Diagnosing Phys: Adrian Prows MD IMPRESSIONS  1. Left ventricular ejection fraction, by estimation, is 25 to 30%. The left ventricle has severely decreased function. The left ventricle demonstrates global hypokinesis. Left ventricular diastolic function could not be evaluated.  2.  Right ventricular systolic function is normal. The right ventricular size is normal.  3. Left atrial size was mildly dilated.  4. The mitral valve is normal in structure. No evidence of mitral valve regurgitation. No evidence of mitral stenosis.  5. The aortic valve is normal in structure. Aortic valve regurgitation is not visualized. No aortic stenosis is present.  6. The inferior vena cava is normal in size with greater than 50% respiratory variability, suggesting right atrial pressure of 3 mmHg. Comparison(s): Prior images unable to be directly viewed, comparison made by report only. Compared to 09/29/2019, EF has decreased from 40%. Findings consistent with cardiomyopathy. FINDINGS  Left Ventricle: Left ventricular ejection fraction, by estimation, is 25 to 30%. The left ventricle has severely decreased function. The left ventricle demonstrates global hypokinesis. Definity contrast agent was given IV to delineate the left ventricular endocardial borders. The left ventricular internal cavity size was normal in size. There is no left ventricular hypertrophy. Abnormal (paradoxical) septal motion, consistent with left bundle branch block. Left ventricular diastolic function could not be evaluated. Right Ventricle: The right ventricular size is normal. No increase in right ventricular wall thickness. Right ventricular systolic function is normal. Left Atrium: Left atrial size was mildly dilated. Right Atrium: Right atrial size was normal in size. Pericardium: There is no evidence of pericardial effusion. Mitral Valve: The mitral valve is normal in structure. Normal mobility of the mitral valve leaflets. No evidence of mitral valve regurgitation. No evidence of mitral valve stenosis. Tricuspid  Valve: The tricuspid valve is normal in structure. Tricuspid valve regurgitation is not demonstrated. No evidence of tricuspid stenosis. Aortic Valve: The aortic valve is normal in structure. Aortic valve regurgitation is not  visualized. No aortic stenosis is present. Pulmonic Valve: The pulmonic valve was grossly normal. Pulmonic valve regurgitation is not visualized. Aorta: The aortic root is normal in size and structure. Venous: The inferior vena cava is normal in size with greater than 50% respiratory variability, suggesting right atrial pressure of 3 mmHg. IAS/Shunts: No atrial level shunt detected by color flow Doppler.  LEFT VENTRICLE PLAX 2D LVIDd:         5.20 cm LVIDs:         3.80 cm LV PW:         0.90 cm LV IVS:        1.00 cm LVOT diam:     2.30 cm LV SV:         71 LV SV Index:   31 LVOT Area:     4.15 cm  LV Volumes (MOD) LV vol d, MOD A2C: 210.0 ml LV vol d, MOD A4C: 186.5 ml LV vol s, MOD A2C: 127.0 ml LV vol s, MOD A4C: 153.0 ml LV SV MOD A2C:     83.0 ml LV SV MOD A4C:     186.5 ml LV SV MOD BP:      59.9 ml RIGHT VENTRICLE RV S prime:     7.72 cm/s TAPSE (M-mode): 1.0 cm LEFT ATRIUM             Index       RIGHT ATRIUM          Index LA Vol (A2C):   57.5 ml 25.27 ml/m RA Area:     9.15 cm LA Vol (A4C):   79.2 ml 34.81 ml/m RA Volume:   15.90 ml 6.99 ml/m LA Biplane Vol: 70.1 ml 30.81 ml/m  AORTIC VALVE LVOT Vmax:   81.60 cm/s LVOT Vmean:  57.700 cm/s LVOT VTI:    0.170 m  AORTA Ao Root diam: 3.20 cm Ao Asc diam:  3.50 cm  SHUNTS Systemic VTI:  0.17 m Systemic Diam: 2.30 cm Adrian Prows MD Electronically signed by Adrian Prows MD Signature Date/Time: 12/30/2019/6:02:30 AM    Final    (Echo, Carotid, EGD, Colonoscopy, ERCP)    Subjective: Patient was seen and examined.  No overnight events.  She feels better.  Ambulating around.  No more dizziness or shortness of breath.  Still on 6 L of oxygen and saturates anywhere between 86%-90%.  Eager to go home.  She will feel better and recover better at home.  We discussed different options including staying in the hospital for monitoring or monitoring closely at home and she would really like to go home.   Discharge Exam: Vitals:   01/04/20 0538 01/04/20 0814  BP:  99/74   Pulse: 70   Resp:    Temp: 97.8 F (36.6 C)   SpO2: 90% (!) 86%   Vitals:   01/03/20 1802 01/03/20 2131 01/04/20 0538 01/04/20 0814  BP:  98/73 99/74   Pulse:  69 70   Resp:      Temp:  98.3 F (36.8 C) 97.8 F (36.6 C)   TempSrc:  Oral Oral   SpO2: 92% (!) 86% 90% (!) 86%  Weight:      Height:        General: Pt is alert, awake, not in acute distress On 6  L. Cardiovascular: RRR, S1/S2 +, no rubs, no gallops no added sounds. Respiratory: CTA bilaterally, no wheezing, no rhonchi Abdominal: Soft, NT, ND, bowel sounds + Extremities: no edema, no cyanosis    The results of significant diagnostics from this hospitalization (including imaging, microbiology, ancillary and laboratory) are listed below for reference.     Microbiology: No results found for this or any previous visit (from the past 240 hour(s)).   Labs: BNP (last 3 results) Recent Labs    12/30/19 0436 12/31/19 0431 01/01/20 0945  BNP 197.4* 284.0* 99.8   Basic Metabolic Panel: Recent Labs  Lab 12/30/19 0436 12/31/19 0431 01/01/20 0449 01/03/20 0557 01/04/20 0450  NA 138 137 136 136 138  K 4.1 4.0 3.7 3.9 5.3*  CL 95* 93* 91* 95* 96*  CO2 32 24 31 30  32  GLUCOSE 178* 199* 188* 137* 208*  BUN 37* 41* 45* 36* 29*  CREATININE 0.91 0.95 0.96 1.08* 0.94  CALCIUM 8.9 8.7* 9.0 9.1 9.1   Liver Function Tests: Recent Labs  Lab 12/29/19 0830  AST 18  ALT 22  ALKPHOS 47  BILITOT 0.7  PROT 6.9  ALBUMIN 3.4*   No results for input(s): LIPASE, AMYLASE in the last 168 hours. No results for input(s): AMMONIA in the last 168 hours. CBC: No results for input(s): WBC, NEUTROABS, HGB, HCT, MCV, PLT in the last 168 hours. Cardiac Enzymes: No results for input(s): CKTOTAL, CKMB, CKMBINDEX, TROPONINI in the last 168 hours. BNP: Invalid input(s): POCBNP CBG: Recent Labs  Lab 01/03/20 0754 01/03/20 1301 01/03/20 1704 01/04/20 0806 01/04/20 1207  GLUCAP 102* 122* 182* 186* 214*    D-Dimer No results for input(s): DDIMER in the last 72 hours. Hgb A1c No results for input(s): HGBA1C in the last 72 hours. Lipid Profile No results for input(s): CHOL, HDL, LDLCALC, TRIG, CHOLHDL, LDLDIRECT in the last 72 hours. Thyroid function studies No results for input(s): TSH, T4TOTAL, T3FREE, THYROIDAB in the last 72 hours.  Invalid input(s): FREET3 Anemia work up No results for input(s): VITAMINB12, FOLATE, FERRITIN, TIBC, IRON, RETICCTPCT in the last 72 hours. Urinalysis    Component Value Date/Time   COLORURINE YELLOW 11/25/2011 0843   APPEARANCEUR CLEAR 11/25/2011 0843   LABSPEC 1.020 11/25/2011 0843   PHURINE 6.0 11/25/2011 0843   GLUCOSEU NEGATIVE 11/25/2011 0843   HGBUR NEGATIVE 11/25/2011 0843   BILIRUBINUR NEG 12/02/2018 1203   KETONESUR NEGATIVE 11/25/2011 0843   PROTEINUR Negative 12/02/2018 1203   PROTEINUR NEGATIVE 11/25/2011 0843   UROBILINOGEN 0.2 12/02/2018 1203   UROBILINOGEN 0.2 11/25/2011 0843   NITRITE NEG 12/02/2018 1203   NITRITE NEGATIVE 11/25/2011 0843   LEUKOCYTESUR Negative 12/02/2018 1203   Sepsis Labs Invalid input(s): PROCALCITONIN,  WBC,  LACTICIDVEN Microbiology No results found for this or any previous visit (from the past 240 hour(s)).   Time coordinating discharge:  40 minutes  SIGNED:   Barb Merino, MD  [facility] 01/04/2020, 1:33 PM

## 2020-01-04 NOTE — Progress Notes (Addendum)
SATURATION QUALIFICATIONS: (This note is used to comply with regulatory documentation for home oxygen)  Patient Saturations on RA at Rest = 84%  Patient Saturations on 6L while Ambulating = 86%  Patient Saturations on 6L Liters of oxygen while Ambulating = 86%  Please briefly explain why patient needs home oxygen:patient with increased oxygen demand with exertion

## 2020-01-04 NOTE — Plan of Care (Signed)
Discharge instructions reviewed with patient, questions answered, verbalized understanding.  Patient awaiting oxygen for discharge.

## 2020-01-04 NOTE — Progress Notes (Addendum)
SATURATION QUALIFICATIONS: (This note is used to comply with regulatory documentation for home oxygen)  Patient Saturations on 5L at Rest = 86%  Patient Saturations on 6L while Ambulating = 83%  Patient Saturations on 6L Liters of oxygen while Ambulating = 83%  Please briefly explain why patient needs home oxygen: desats with exertion

## 2020-01-06 ENCOUNTER — Telehealth: Payer: Self-pay

## 2020-01-06 NOTE — Telephone Encounter (Signed)
Transition Care Management Follow-up Telephone Call  Date of discharge and from where: 01/04/2020  How have you been since you were released from the hospital? Okay  Any questions or concerns? No  Items Reviewed:  Did the pt receive and understand the discharge instructions provided? Yes   Medications obtained and verified? Yes   Any new allergies since your discharge? No   Dietary orders reviewed? Yes  Do you have support at home? Yes  husband and kids  Functional Questionnaire: (I = Independent and D = Dependent) ADLs: I  Bathing/Dressing- I  Meal Prep- I  Eating- I  Maintaining continence- I  Transferring/Ambulation- I  Managing Meds- I  Follow up appointments reviewed:   PCP Hospital f/u appt confirmed? Yes  Scheduled to see Dr. Renold Genta on 01/12/20 @ 11:00am.  [facility] f/u appt confirmed? Yes  Scheduled to see on 01/24/20 @ 10:45am.  Are transportation arrangements needed? No   If their condition worsens, is the pt aware to call PCP or go to the Emergency Dept.? Yes  Was the patient provided with contact information for the PCP's office or ED? Yes  Was to pt encouraged to call back with questions or concerns? Yes

## 2020-01-07 ENCOUNTER — Telehealth: Payer: Self-pay | Admitting: *Deleted

## 2020-01-07 NOTE — Telephone Encounter (Signed)
Tried to reach patient to advise needs MD appt for documentation for reapproval for Xolair for Ins but voicemail full and unable to leave message. Will put note in next injection appt her approval ends 01/28/20

## 2020-01-10 ENCOUNTER — Telehealth: Payer: Self-pay | Admitting: Hematology and Oncology

## 2020-01-10 NOTE — Telephone Encounter (Signed)
Called pt per 9/13 sch msg - cancelled apt - unable to leave message.

## 2020-01-11 ENCOUNTER — Ambulatory Visit: Payer: 59 | Admitting: Hematology and Oncology

## 2020-01-11 ENCOUNTER — Other Ambulatory Visit: Payer: No Typology Code available for payment source

## 2020-01-12 ENCOUNTER — Other Ambulatory Visit: Payer: Self-pay

## 2020-01-12 ENCOUNTER — Ambulatory Visit: Payer: No Typology Code available for payment source | Admitting: Internal Medicine

## 2020-01-12 ENCOUNTER — Other Ambulatory Visit: Payer: Self-pay | Admitting: Cardiology

## 2020-01-12 ENCOUNTER — Encounter: Payer: Self-pay | Admitting: Internal Medicine

## 2020-01-12 VITALS — BP 102/80 | HR 94 | Temp 98.9°F

## 2020-01-12 DIAGNOSIS — U071 COVID-19: Secondary | ICD-10-CM

## 2020-01-12 DIAGNOSIS — I427 Cardiomyopathy due to drug and external agent: Secondary | ICD-10-CM

## 2020-01-12 DIAGNOSIS — Z9889 Other specified postprocedural states: Secondary | ICD-10-CM

## 2020-01-12 DIAGNOSIS — E1169 Type 2 diabetes mellitus with other specified complication: Secondary | ICD-10-CM | POA: Diagnosis not present

## 2020-01-12 DIAGNOSIS — E669 Obesity, unspecified: Secondary | ICD-10-CM | POA: Diagnosis not present

## 2020-01-12 DIAGNOSIS — Z853 Personal history of malignant neoplasm of breast: Secondary | ICD-10-CM

## 2020-01-12 DIAGNOSIS — J1282 Pneumonia due to coronavirus disease 2019: Secondary | ICD-10-CM | POA: Diagnosis not present

## 2020-01-12 DIAGNOSIS — E039 Hypothyroidism, unspecified: Secondary | ICD-10-CM

## 2020-01-12 LAB — POCT GLUCOSE (DEVICE FOR HOME USE): Glucose Fasting, POC: 306 mg/dL — AB (ref 70–99)

## 2020-01-12 MED ORDER — BUDESONIDE-FORMOTEROL FUMARATE 160-4.5 MCG/ACT IN AERO: 2.0000 | INHALATION_SPRAY | Freq: Two times a day (BID) | RESPIRATORY_TRACT | 3 refills | Status: DC

## 2020-01-12 MED ORDER — DOXYCYCLINE HYCLATE 100 MG PO TABS: 100.0000 mg | ORAL_TABLET | Freq: Two times a day (BID) | ORAL | 0 refills | Status: DC

## 2020-01-12 NOTE — Patient Instructions (Addendum)
It was a pleasure to see you today.  Sliding scale insulin has been ordered but you will need to monitor Accu-Cheks carefully since Dr. Chalmers Cater started Rebecca [name].  Like to keep Accu-Cheks 200 or less.  It was 300 today.  Doxycycline 100 mg twice daily for 10 days.  Continue Atrovent inhaler and Symbicort inhaler has been added as well.  Order given for portable oxygen.  Follow-up in 1 week.

## 2020-01-12 NOTE — Progress Notes (Signed)
Subjective:    Patient ID: Rebecca [name], female    DOB: May 20, 1970, 49 y.o.   MRN: [id number]  HPI  49 year old Female discharged form [facility] September 7th after being admitted with severe Covid-19 infection.  Currently on oxygen 6 L/min.  When we ambulate her here in the office today at a reasonable pace down the hall her O2 sat dropped to 84%.  She would like to have a portable oxygen device and I have given a prescription for lead and they will check into home health oxygen company.  She has a history of type 2 diabetes mellitus, hypertension, hyperlipidemia, breast cancer status post bilateral mastectomy and chemotherapy in remission.  She has a history of chemotherapy induced cardiomyopathy and hypothyroidism.  Emergency department on admission pulse oximetry was 94% on room air, temperature 102.2 degrees and chest x-ray with bilateral infiltrates.  History of chronic systolic failure with known ejection fraction of 25% treated by Dr. Einar Gip.  Patient was treated with high-dose steroids, remdesivir for 5 days and baricitinib rectal discharge.  She was admitted to the ICU after decompensating shortly after admission.  At one point she was on 15 L of oxygen.  However she was never intubated.  Wilder Glade was prescribed due to heart failure.  Her endocrinologist is Dr. Chalmers Cater.  She was covered with insulin in the hospital.  Dr. Chalmers Cater started Tyler Aas yesterday 10 units daily.  However, patient's Accu-Chek in office today is 300.  I think we could probably use  sliding scale insulin for a few days if okay with Dr. Chalmers Cater.  I contacted patient this evening and Accu-Chek was 300 but he can only been an hour and a half since she ate.  She will continue to monitor her Accu-Chek frequently this evening at 11 PM and 2 AM.  A Symbicort inhaler was prescribed today.  An Atrovent inhaler was prescribed in the hospital.  She is to continue the Atrovent inhaler 2 sprays 4 times a day and  Symbicort inhaler 160/4.5 milligrams 2 sprays twice daily.  I would suggest holding off on Xolair injection q. 28 days for hives which have been prescribed by Dr. Neldon Mc prior to her becoming ill with COVID-19.  She is completely off of oral steroids at this point in time.  She has follow-up with Dr. Einar Gip later this month.  Last chest x-ray was portable one on August 31 2 showing cardiomegaly and progressive bilateral interstitial prominence suggesting heart failure but pneumonitis cannot be excluded.  Today she is comfortable at rest wearing her oxygen but by the time she walks down the hall she is winded.  We had a discussion today about her taking short walks on flat surfaces outside of her home.   Review of Systems denies nausea and vomiting.  Able to eat.  Lead to me at home.  Needs FMLA paperwork completed     Objective:   Physical Exam Temperature 98.9 degrees pulse 94 regular blood pressure 102/80 pulse oximetry 94% on 6 L of oxygen  Skin warm and dry.  She is alert and oriented x3.  Chest is clear to auscultation but has some coarse breath sounds right base.  Cardiac exam regular rate and rhythm normal S1 and S2.  No lower extremity pitting edema.  Affect thought and judgment are normal.       Assessment & Plan:  Status post COVID-19 pneumonia-slowly improving requiring oxygen 6 L/min.  Prescribed doxycycline 100 mg twice daily for 10 days.  Is no longer on steroids.  I have prescribed Symbicort.  Continue Atrovent inhaler.  History of chemotherapy induced cardiomyopathy followed by Dr. Einar Gip  History of breast cancer  History of urticaria-has had recent issues with urticaria for a number of months and is seen by Dr. Neldon Mc.  Type 2 diabetes mellitus-endocrinologist has recently started [place].  Her Accu-Chek today is 309 have asked that she take some regular insulin on a sliding scale but I will need to check with Dr. Chalmers Cater to see if that can be continued or not since  she is now on Tresiba.  She is going to need to monitor her Accu-Cheks carefully.  I will see her again in 1 week.  Labs were drawn today as requested by discharging physician.  Monitor Accu-Cheks carefully.  Try to walk a bit but not overdo it.  She has follow-up with Dr. Einar Gip soon.  Call if she has any questions whatsoever.  60 minutes spent with patient as well as reviewing records in the hospital and medical decision making.

## 2020-01-13 LAB — COMPLETE METABOLIC PANEL WITH GFR
AG Ratio: 1.6 (calc) (ref 1.0–2.5)
ALT: 25 U/L (ref 6–29)
AST: 13 U/L (ref 10–35)
Albumin: 3.5 g/dL — ABNORMAL LOW (ref 3.6–5.1)
Alkaline phosphatase (APISO): 55 U/L (ref 37–153)
BUN: 13 mg/dL (ref 7–25)
CO2: 31 mmol/L (ref 20–32)
Calcium: 8.5 mg/dL — ABNORMAL LOW (ref 8.6–10.4)
Chloride: 98 mmol/L (ref 98–110)
Creat: 0.82 mg/dL (ref 0.50–1.05)
GFR, Est African American: 97 mL/min/{1.73_m2} (ref >=60)
GFR, Est Non African American: 83 mL/min/{1.73_m2} (ref >=60)
Globulin: 2.2 g/dL (calc) (ref 1.9–3.7)
Glucose, Bld: 253 mg/dL — ABNORMAL HIGH (ref 65–99)
Potassium: 4.3 mmol/L (ref 3.5–5.3)
Sodium: 138 mmol/L (ref 135–146)
Total Bilirubin: 0.4 mg/dL (ref 0.2–1.2)
Total Protein: 5.7 g/dL — ABNORMAL LOW (ref 6.1–8.1)

## 2020-01-13 LAB — CBC WITH DIFFERENTIAL/PLATELET
Absolute Monocytes: 456 cells/uL (ref 200–950)
Basophils Absolute: 0 cells/uL (ref 0–200)
Basophils Relative: 0 %
Eosinophils Absolute: 7 cells/uL — ABNORMAL LOW (ref 15–500)
Eosinophils Relative: 0.1 %
HCT: 35.9 % (ref 35.0–45.0)
Hemoglobin: 11.4 g/dL — ABNORMAL LOW (ref 11.7–15.5)
Lymphs Abs: 1045 cells/uL (ref 850–3900)
MCH: 27 pg (ref 27.0–33.0)
MCHC: 31.8 g/dL — ABNORMAL LOW (ref 32.0–36.0)
MCV: 85.1 fL (ref 80.0–100.0)
MPV: 10.8 fL (ref 7.5–12.5)
Monocytes Relative: 6.8 %
Neutro Abs: 5193 cells/uL (ref 1500–7800)
Neutrophils Relative %: 77.5 %
Platelets: 250 10*3/uL (ref 140–400)
RBC: 4.22 10*6/uL (ref 3.80–5.10)
RDW: 14.5 % (ref 11.0–15.0)
Total Lymphocyte: 15.6 %
WBC: 6.7 10*3/uL (ref 3.8–10.8)

## 2020-01-13 LAB — BRAIN NATRIURETIC PEPTIDE: Brain Natriuretic Peptide: 72 pg/mL (ref <100)

## 2020-01-18 ENCOUNTER — Ambulatory Visit: Payer: Self-pay

## 2020-01-19 ENCOUNTER — Ambulatory Visit
Admission: RE | Admit: 2020-01-19 | Discharge: 2020-01-19 | Disposition: A | Discharge status: Home / Self Care | Payer: No Typology Code available for payment source | Source: Ambulatory Visit | Attending: Internal Medicine | Admitting: Internal Medicine

## 2020-01-19 ENCOUNTER — Ambulatory Visit: Payer: No Typology Code available for payment source | Admitting: Internal Medicine

## 2020-01-19 ENCOUNTER — Other Ambulatory Visit: Payer: Self-pay

## 2020-01-19 ENCOUNTER — Encounter: Payer: Self-pay | Admitting: Internal Medicine

## 2020-01-19 ENCOUNTER — Ambulatory Visit (INDEPENDENT_AMBULATORY_CARE_PROVIDER_SITE_OTHER): Payer: No Typology Code available for payment source | Admitting: Internal Medicine

## 2020-01-19 VITALS — BP 110/70 | HR 92 | Temp 99.2°F | Ht 70.0 in | Wt 241.0 lb

## 2020-01-19 DIAGNOSIS — E1169 Type 2 diabetes mellitus with other specified complication: Secondary | ICD-10-CM

## 2020-01-19 DIAGNOSIS — Z853 Personal history of malignant neoplasm of breast: Secondary | ICD-10-CM

## 2020-01-19 DIAGNOSIS — I427 Cardiomyopathy due to drug and external agent: Secondary | ICD-10-CM | POA: Diagnosis not present

## 2020-01-19 DIAGNOSIS — R0902 Hypoxemia: Secondary | ICD-10-CM | POA: Diagnosis not present

## 2020-01-19 DIAGNOSIS — J1282 Pneumonia due to coronavirus disease 2019: Secondary | ICD-10-CM

## 2020-01-19 DIAGNOSIS — U071 COVID-19: Secondary | ICD-10-CM | POA: Diagnosis not present

## 2020-01-19 DIAGNOSIS — E669 Obesity, unspecified: Secondary | ICD-10-CM

## 2020-01-19 IMAGING — CT CT CHEST W/ CM
1 of 2 series · 14 of 30 positions shown, 18 images · IV contrast (iopamidol)
Comparison: [DATE] and chest radiograph from [DATE]

CLINICAL DATA: 49-year-old female with history of [AA]
pneumonia and persistent hypoxia.

EXAM:
CT CHEST WITH CONTRAST
TECHNIQUE: Multidetector CT imaging of the chest was performed during
intravenous contrast administration.
CONTRAST:  75mL [AA] IOPAMIDOL ([AA]) INJECTION 61%

[Series 2: chest w/cm · axial · 0.82mm/px · z∈[-110,+152]mm · 14 of 153 slices shown, 18 images]
[im 11/153  mediastinal]
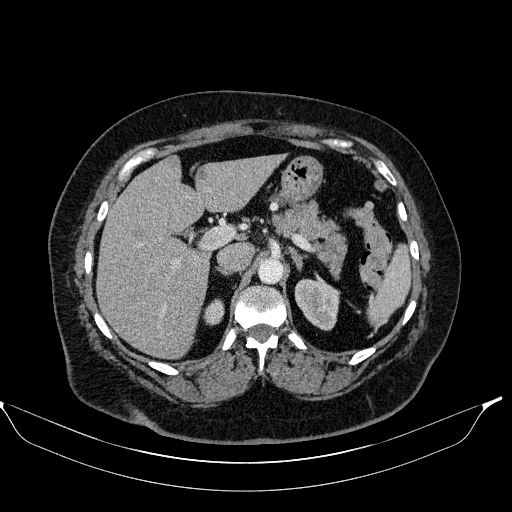
[im 11/153  lung]
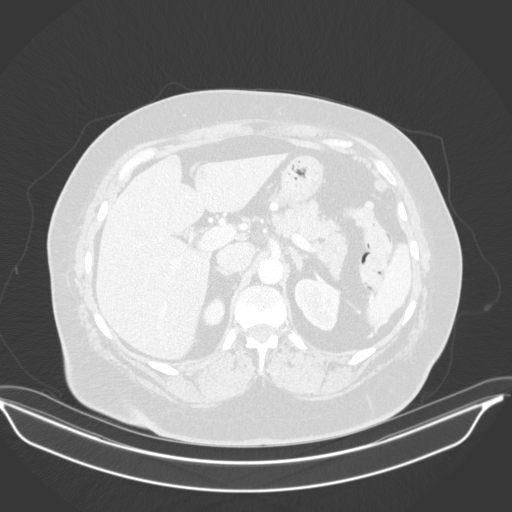
[im 22/153  lung]
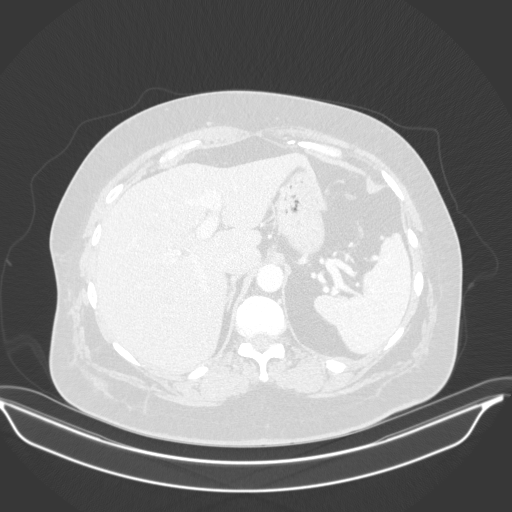
[im 33/153  lung]
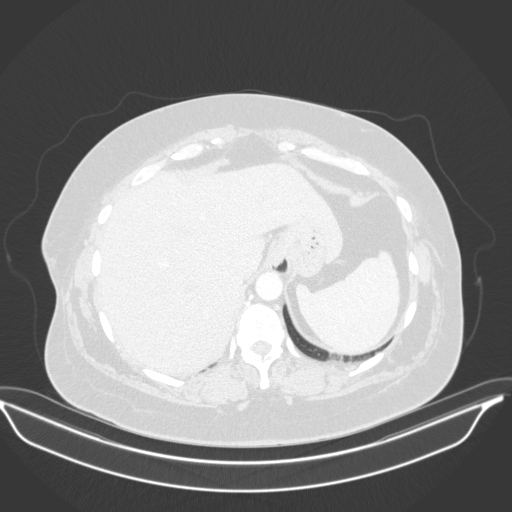
[im 44/153  lung]
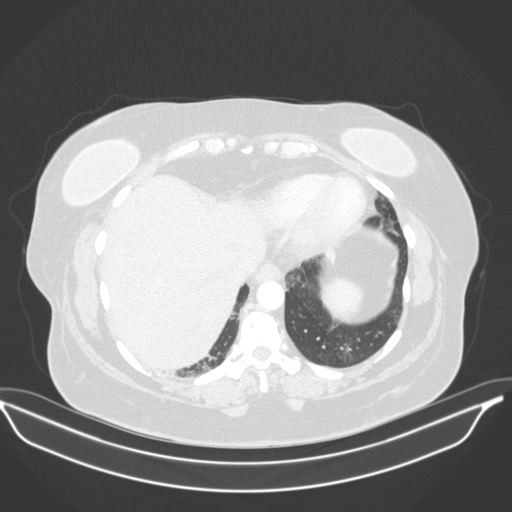
[im 55/153  mediastinal]
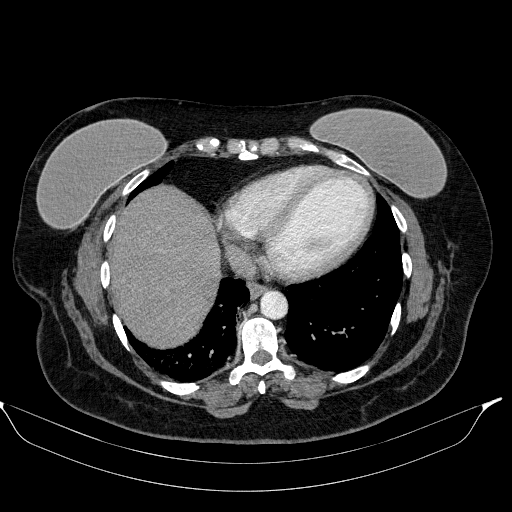
[im 55/153  lung]
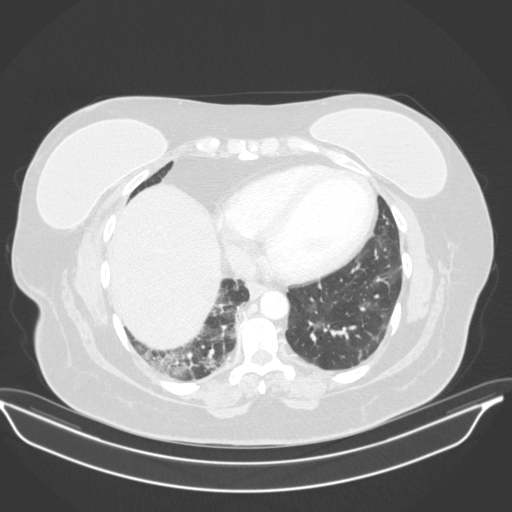
[im 66/153  lung]
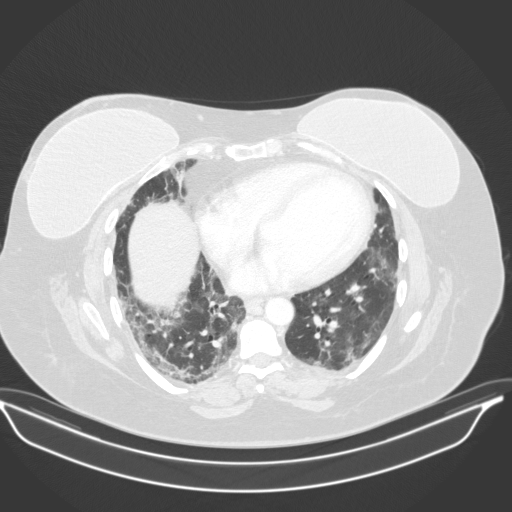
[im 72/153  lung]
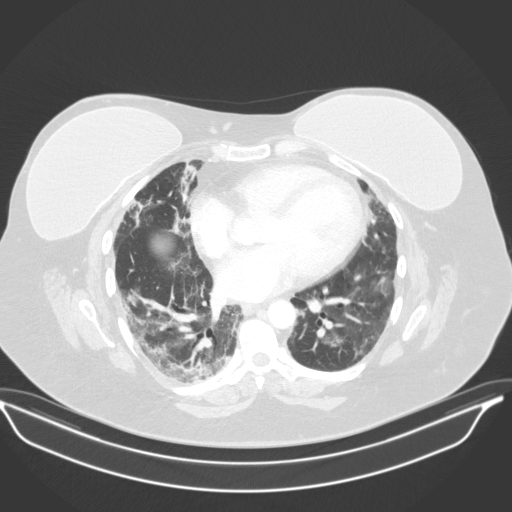
[im 77/153  lung]
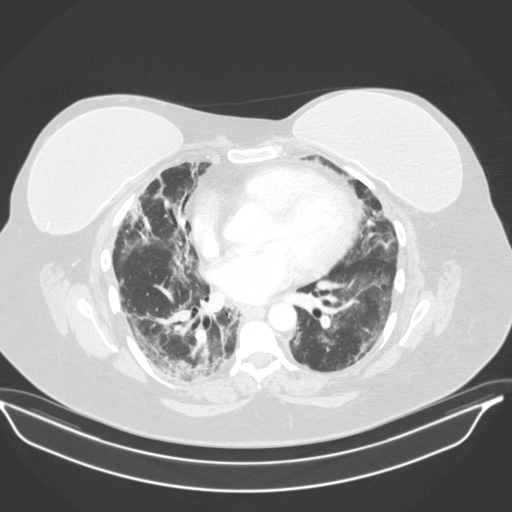
[im 87/153  mediastinal]
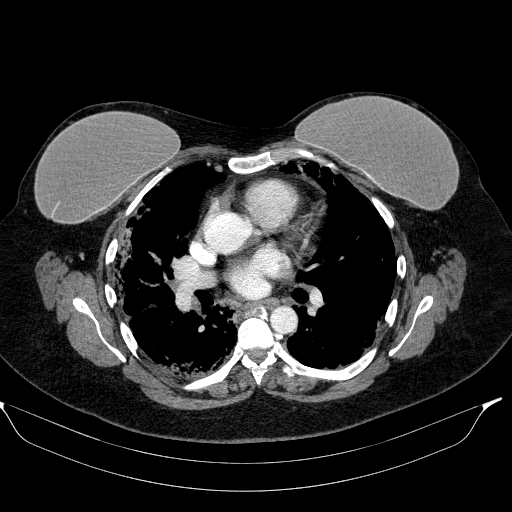
[im 87/153  lung]
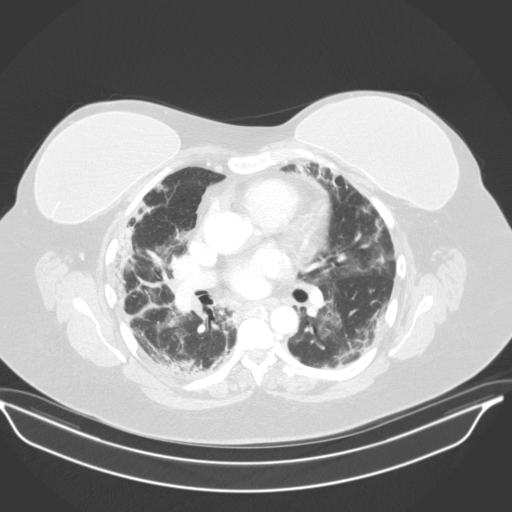
[im 98/153  lung]
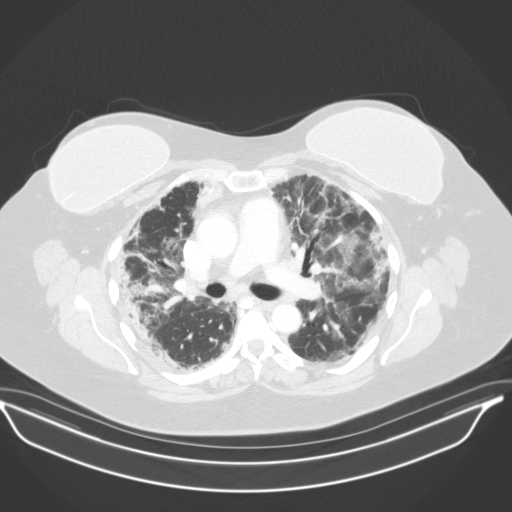
[im 109/153  lung]
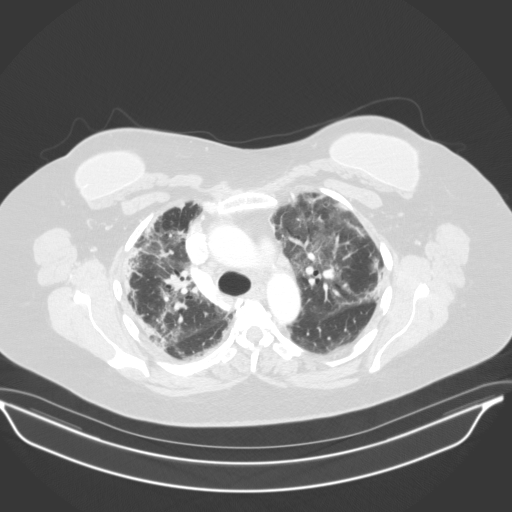
[im 120/153  lung]
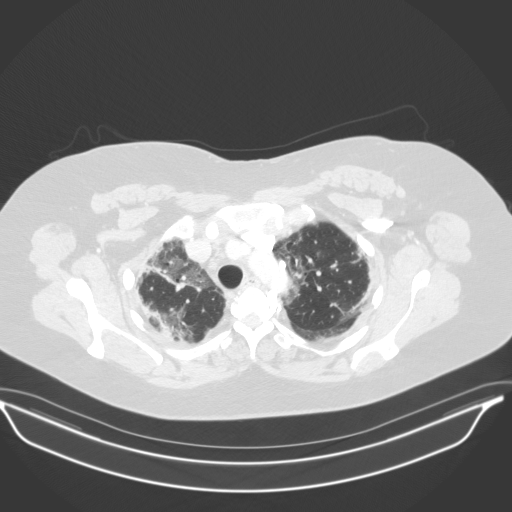
[im 131/153  mediastinal]
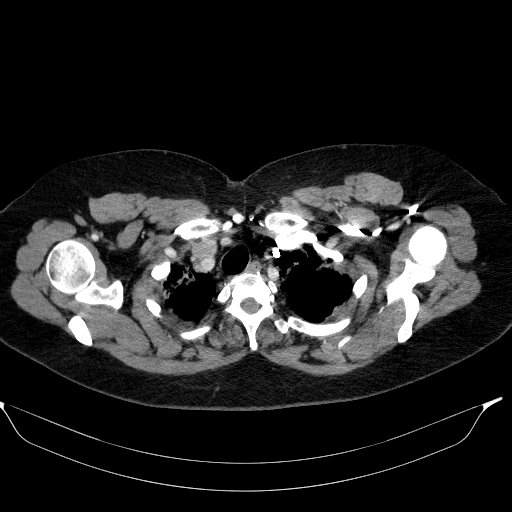
[im 131/153  lung]
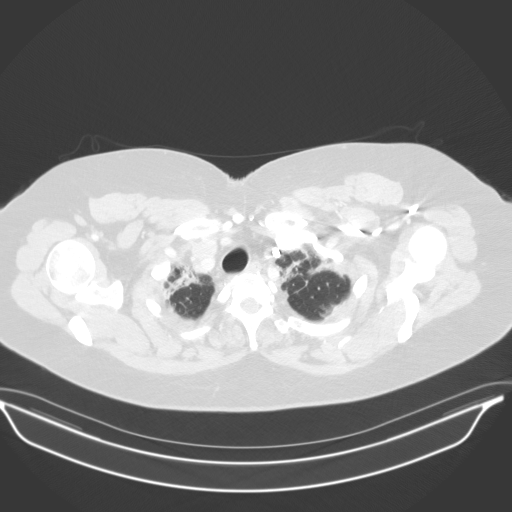
[im 142/153  lung]
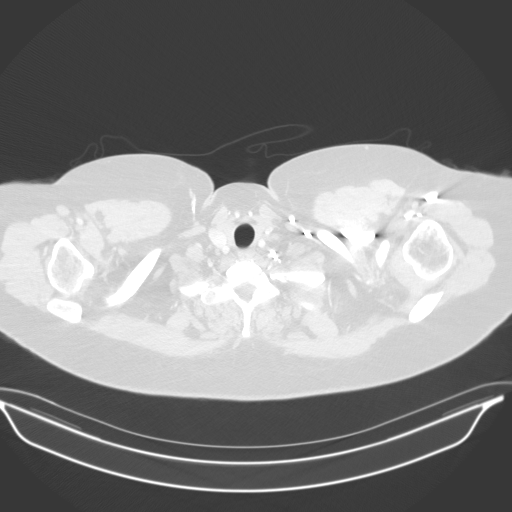

[14 of 30 positions shown; findings below may reference images not displayed]

FINDINGS: Cardiovascular: No significant vascular findings. Normal heart size.
No pericardial effusion.

Mediastinum/Nodes: There are few scattered prominent mediastinal
lymph nodes, none pathologic by size criteria. No enlarged axillary
lymph nodes. Thyroid gland, trachea, and esophagus demonstrate no
significant findings.

Lungs/Pleura: There are diffuse, peripherally predominant
ground-glass and linear consolidative opacities. No pleural effusion
or pneumothorax.

Upper Abdomen: Small hiatal hernia. Decreased attenuation of the
hepatic parenchyma relative to the spleen as can be seen with
hepatic steatosis. The visualized upper abdomen is otherwise within
normal limits.

Musculoskeletal: No chest wall abnormality. No acute or significant
osseous findings. Bilateral breast prostheses are unchanged.
IMPRESSION: 1. Diffuse, peripherally predominant ground-glass and linear
consolidative pulmonary opacities, findings typical of COVID 19
associated pneumonia.
2. Small hiatal hernia.
3. Hepatic steatosis.

## 2020-01-19 MED ORDER — IOPAMIDOL (ISOVUE-300) INJECTION 61%
75.0000 mL | Freq: Once | INTRAVENOUS | Status: AC | PRN
  Administered 2020-01-19: 75 mL via INTRAVENOUS

## 2020-01-19 MED ORDER — PREDNISONE 10 MG PO TABS: ORAL_TABLET | ORAL | 0 refills | Status: DC

## 2020-01-20 ENCOUNTER — Encounter: Payer: Self-pay | Admitting: Hematology and Oncology

## 2020-01-23 NOTE — Patient Instructions (Signed)
Take prednisone in tapering course as directed over 12 days.  Follow-up with Dr. Einar Gip next week.  See me in 2 weeks.  CT of the chest shows persistent bilateral pneumonia.  These findings may take several weeks to clear up.  We will be referring you to Pulmonary for follow-up.

## 2020-01-23 NOTE — Progress Notes (Signed)
   Subjective:    Patient ID: Rebecca [name], female    DOB: 01-11-1971, 49 y.o.   MRN: [id number]  HPI 49 year old Female seen today for follow up of Covid-19 infection which required hospitalization August 28 through September 7.  She remains on 6 L of oxygen by nasal cannula.  She feels better.  Is using sliding scale insulin to control hyperglycemia.  She was admitted August 28 through September 7 with COVID-19 infection and associated pneumonia.  We have ordered a CT of the chest with contrast today for follow-up.  She is finishing up a 10-day course of doxycycline.  She will be placed  on prednisone in a tapering course starting with 60 mg and decreasing by 10 mg every 2 days.  We will make referral for her to see Pulmonologist.  She has an appointment to see Dr. Einar Gip soon.  She feels winded when she walks short distances but feels that she has made little progress from last week.  Has been trying to walk at home.    Review of Systems- is able to eat some without nausea.     Objective:   Physical Exam Vital signs blood pressure 110/70 pulse 92 regular temperature 99.2 degrees pulse oximetry 94% weight 241 pounds BMI 34.58  Skin warm and dry.  Neck is supple.  Chest sounds fairly clear bilaterally without significant rales       Assessment & Plan:  COVID-19 pneumonia-reviewed CT of chest.  Does not have pleural effusions or enlarged heart  History of chemotherapy induced cardiomyopathy-appointment see Dr. Einar Gip soon  Type 2 diabetes mellitus-controlling hyperglycemia with sliding scale.  Plan: We will make referral to Pulmonary for her to see Pulmonologist.  She has upcoming appointment with her Cardiologist, Dr. Einar Gip next week.  I will see her again in 2 weeks.  She knows she can call if she has any questions or problems.  She will continue to monitor her Accu-Cheks closely and use sliding scale insulin.  She will be taking prednisone in tapering course starting with 60 mg and  decreasing by 10 mg every 2 days to complete a 12-day course.  She will continue home oxygen at 6 L/min.  Continue to walk at home and try to regain her strength and endurance.

## 2020-01-24 ENCOUNTER — Encounter: Payer: Self-pay | Admitting: Cardiology

## 2020-01-24 ENCOUNTER — Other Ambulatory Visit: Payer: Self-pay

## 2020-01-24 ENCOUNTER — Ambulatory Visit: Payer: No Typology Code available for payment source | Admitting: Cardiology

## 2020-01-24 VITALS — BP 107/73 | HR 79 | Resp 16 | Ht 70.0 in | Wt 239.0 lb

## 2020-01-24 DIAGNOSIS — E782 Mixed hyperlipidemia: Secondary | ICD-10-CM

## 2020-01-24 DIAGNOSIS — I5022 Chronic systolic (congestive) heart failure: Secondary | ICD-10-CM

## 2020-01-24 DIAGNOSIS — I427 Cardiomyopathy due to drug and external agent: Secondary | ICD-10-CM

## 2020-01-24 DIAGNOSIS — E1169 Type 2 diabetes mellitus with other specified complication: Secondary | ICD-10-CM

## 2020-01-24 DIAGNOSIS — E669 Obesity, unspecified: Secondary | ICD-10-CM

## 2020-01-24 MED ORDER — CARVEDILOL 12.5 MG PO TABS: 12.5000 mg | ORAL_TABLET | Freq: Two times a day (BID) | ORAL | 3 refills | Status: DC

## 2020-01-24 MED ORDER — ENTRESTO 97-103 MG PO TABS: 1.0000 | ORAL_TABLET | Freq: Two times a day (BID) | ORAL | 3 refills | Status: DC

## 2020-01-24 MED ORDER — FUROSEMIDE 40 MG PO TABS: 40.0000 mg | ORAL_TABLET | Freq: Every day | ORAL | 1 refills | Status: DC | PRN

## 2020-01-24 MED ORDER — ROSUVASTATIN CALCIUM 20 MG PO TABS: 20.0000 mg | ORAL_TABLET | Freq: Every day | ORAL | 3 refills | Status: DC

## 2020-01-24 MED ORDER — DAPAGLIFLOZIN PROPANEDIOL 5 MG PO TABS: 10.0000 mg | ORAL_TABLET | Freq: Every day | ORAL | 3 refills | Status: DC

## 2020-01-24 NOTE — Progress Notes (Signed)
Primary Physician/Referring:  Elby Showers, MD  Patient ID: Rebecca [name], female    DOB: 06/04/1970, 49 y.o.   MRN: [id number]  Chief Complaint  Patient presents with  . Cardiomyopathy  . Hospitalization Follow-up   HPI:    Rebecca [name]  is a 49 y.o. Caucasian female initially with history of LBBB at least dating back to 2013. At that time she has had a negative nuclear stress test and echocardiogram revealed low normal LVEF at 45-50%.   Past medical history includes hyperthyroidism S/P RAI therapy in March 2018, depression, chronic low back pain, estrogen negative right breast cancer for which he underwent chemotherapy(Adjuvant chemotherapy with dose dense Adriamycin and Cytoxan x4)  and also bilateral mastectomy with reconstruction surgery on 11/18/2017. Surveillance echocardiogram due to chemotherapy attributed gradual decrease of LVEF to moderate to severe LV systolic dysfunction of 30 to 35%. 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     She was admitted on 12/25/2019 with COVID-19 pneumonia and also had a prolonged hospitalization and discharged on 01/04/2020.  Repeat echocardiogram hospitalization again revealed decreased LVEF.  She also had acute CHF exacerbation along with acute respiratory distress from Covid pneumonia.  She now presents for follow-up.   She is still requiring oxygen with activity but overall feeling well.  She has not had any further leg edema, no PND or orthopnea.  She is also tolerating Delene Loll and Wilder Glade that was started in the hospital.   Past Medical History:  Diagnosis Date  . Allergy    constant hives post chemo therapy.  All allergen tests have been neg.  Marland Kitchen Anxiety   . Breast cancer ([place])   . Coronary artery disease   . Diabetes mellitus without complication ([place])   . Diverticulitis   . GERD (gastroesophageal reflux disease)   . Hiatal hernia   . Hyperlipidemia   . Hypertension   . Hypothyroidism   . LBBB  (left bundle branch block)    Past Surgical History:  Procedure Laterality Date  . BREAST RECONSTRUCTION WITH PLACEMENT OF TISSUE EXPANDER AND ALLODERM Bilateral 11/18/2017   Procedure: BILATERAL BREAST RECONSTRUCTION WITH PLACEMENT OF TISSUE EXPANDER AND ALLODERM;  Surgeon: Irene Limbo, MD;  Location: [facility];  Service: Plastics;  Laterality: Bilateral;  . CESAREAN SECTION  2007/2010   x2  . COLONOSCOPY  2017  . LAPAROSCOPIC TUBAL LIGATION  06/12/2011   Procedure: LAPAROSCOPIC TUBAL LIGATION;  Surgeon: Daria Pastures, MD;  Location: [facility];  Service: Gynecology;  Laterality: N/A;  filshie clip  . LIPOSUCTION WITH LIPOFILLING Bilateral 02/24/2018   Procedure: LIPOFILLING FROM ABDOMEN TO BILATERAL CHEST;  Surgeon: Irene Limbo, MD;  Location: [facility];  Service: Plastics;  Laterality: Bilateral;  . MASTECTOMY W/ SENTINEL NODE BIOPSY Bilateral 11/18/2017   Procedure: BILATERAL TOTAL MASTECTOMIES WITH RIGHT SENTINEL LYMPH NODE BIOPSY;  Surgeon: Rolm Bookbinder, MD;  Location: [facility];  Service: General;  Laterality: Bilateral;  . mini tuck  2012   abdominal  . PORT-A-CATH REMOVAL Right 01/05/2019  . PORTACATH PLACEMENT Right 07/15/2017   Procedure: INSERTION PORT-A-CATH WITH ULTRASOUND;  Surgeon: Rolm Bookbinder, MD;  Location: [facility];  Service: General;  Laterality: Right;  . REMOVAL OF BILATERAL TISSUE EXPANDERS WITH PLACEMENT OF BILATERAL BREAST IMPLANTS Bilateral 02/24/2018   Procedure: REMOVAL OF BILATERAL TISSUE EXPANDERS WITH PLACEMENT OF BILATERAL BREAST IMPLANTS;  Surgeon: Irene Limbo, MD;  Location: [facility];  Service: Plastics;  Laterality:  Bilateral;  . right breast cancer     upper-outer quadrant   . right ear surg  03/2008   Mohs  . TUBAL LIGATION    . US ECHOCARDIOGRAPHY  12-30-2007   EF 55-60%  . WISDOM TOOTH EXTRACTION      Social History   Tobacco Use  .  Smoking status: Former Smoker    Packs/day: 0.25    Years: 6.00    Pack years: 1.50    Types: Cigarettes    Quit date: 03/29/2005    Years since quitting: 14.8  . Smokeless tobacco: Never Used  Substance Use Topics  . Alcohol use: Yes    Comment: socially  Marital Status: Married    ROS  Review of Systems  Cardiovascular: Positive for dyspnea on exertion. Negative for chest pain and leg swelling.  Gastrointestinal: Negative for melena.  Psychiatric/Behavioral: Positive for depression. The patient is nervous/anxious.    Objective   Vitals with BMI 01/24/2020 01/19/2020 01/12/2020  Height 5\' 10"  5\' 10"  -  Weight 239 lbs 241 lbs -  BMI 40.98 11.91 -  Systolic 478 295 621  Diastolic 73 70 80  Pulse 79 92 94    Blood pressure 107/73, pulse 79, resp. rate 16, height 5\' 10"  (1.778 m), weight 239 lb (108.4 kg), SpO2 96 %. Body mass index is 34.29 kg/m.  Vitals with BMI 01/24/2020 01/19/2020 01/12/2020  Height 5\' 10"  5\' 10"  -  Weight 239 lbs 241 lbs -  BMI 30.86 57.84 -  Systolic 696 295 284  Diastolic 73 70 80  Pulse 79 92 94     Physical Exam Constitutional:      Comments: Well built and mildly obese in no acute distress  Cardiovascular:     Rate and Rhythm: Normal rate and regular rhythm.     Pulses: Intact distal pulses.     Heart sounds: Normal heart sounds. No murmur heard.  No gallop.      Comments: NO JVD. NO leg edema Pulmonary:     Effort: Pulmonary effort is normal.     Breath sounds: Normal breath sounds.  Abdominal:     General: Bowel sounds are normal.     Palpations: Abdomen is soft.  Musculoskeletal:     Cervical back: Neck supple.    Radiology: No results found.  Laboratory examination:   Recent Labs    01/03/20 0557 01/04/20 0450 01/12/20 1130  NA 136 138 138  K 3.9 5.3* 4.3  CL 95* 96* 98  CO2 30 32 31  GLUCOSE 137* 208* 253*  BUN 36* 29* 13  CREATININE 1.08* 0.94 0.82  CALCIUM 9.1 9.1 8.5*  GFRNONAA 60* >60 83  GFRAA >60 >60 97   CMP  Latest Ref Rng & Units 01/12/2020 01/04/2020 01/03/2020  Glucose 65 - 99 mg/dL 253(H) 208(H) 137(H)  BUN 7 - 25 mg/dL 13 29(H) 36(H)  Creatinine 0.50 - 1.05 mg/dL 0.82 0.94 1.08(H)  Sodium 135 - 146 mmol/L 138 138 136  Potassium 3.5 - 5.3 mmol/L 4.3 5.3(H) 3.9  Chloride 98 - 110 mmol/L 98 96(L) 95(L)  CO2 20 - 32 mmol/L 31 32 30  Calcium 8.6 - 10.4 mg/dL 8.5(L) 9.1 9.1  Total Protein 6.1 - 8.1 g/dL 5.7(L) - -  Total Bilirubin 0.2 - 1.2 mg/dL 0.4 - -  Alkaline Phos 38 - 126 U/L - - -  AST 10 - 35 U/L 13 - -  ALT 6 - 29 U/L 25 - -   CBC Latest  Ref Rng & Units 01/12/2020 12/25/2019 12/25/2019  WBC 3.8 - 10.8 Thousand/uL 6.7 4.1 -  Hemoglobin 11.7 - 15.5 g/dL 11.4(L) 12.2 13.3  Hematocrit 35 - 45 % 35.9 38.9 39.0  Platelets 140 - 400 Thousand/uL 250 195 -   Lipid Panel Recent Labs    01/27/19 0820  CHOL 154  TRIG 158*  LDLCALC 84  HDL 43  CHOLHDL 3.6     HEMOGLOBIN A1C Lab Results  Component Value Date   HGBA1C 8.0 (H) 12/25/2019   MPG 182.9 12/25/2019   TSH Recent Labs    03/19/19 1242 07/02/19 0811  TSH 0.087* 1.630   Medications   Current Outpatient Medications on File Prior to Visit  Medication Sig Dispense Refill  . Ascorbic Acid (VITAMIN C GUMMIE PO) Take 2 tablets by mouth daily.    . budesonide-formoterol (SYMBICORT) 160-4.5 MCG/ACT inhaler Inhale 2 puffs into the lungs 2 (two) times daily. 1 each 3  . cetirizine (ZYRTEC) 10 MG tablet Take 10 mg by mouth daily.    . Cholecalciferol (VITAMIN D3 GUMMIES PO) Take 5,000 Units by mouth daily.    Marland Kitchen EPINEPHrine 0.3 mg/0.3 mL IJ SOAJ injection Use as directed for life threatening allergic reactions only (Patient taking differently: Inject 0.3 mg into the muscle as needed for anaphylaxis. Use as directed for life threatening allergic reactions only) 2 each 3  . esomeprazole (NEXIUM) 40 MG capsule TAKE 1 CAPSULE (40 MG TOTAL) BY MOUTH DAILY AT 12 NOON. 90 capsule 2  . ipratropium (ATROVENT HFA) 17 MCG/ACT inhaler Inhale 2  puffs into the lungs every 4 (four) hours. 1 each 1  . levothyroxine (SYNTHROID) 112 MCG tablet Take 112 mcg by mouth daily.    Marland Kitchen LORazepam (ATIVAN) 1 MG tablet One po bid prn (Patient taking differently: Take 1 mg by mouth 2 (two) times daily as needed for anxiety or sleep. ) 60 tablet 3  . montelukast (SINGULAIR) 10 MG tablet TAKE 1 TABLET BY MOUTH EVERYDAY AT BEDTIME (Patient taking differently: Take 10 mg by mouth at bedtime. ) 90 tablet 2  . Multiple Vitamin (MULTIVITAMIN PO) Take 1 tablet by mouth daily. Multivitamin + Omega-3     . predniSONE (DELTASONE) 10 MG tablet TAKE IN TAPERING COURSE 6-6-5-5-4-4-3-3-2-2-1-1 42 tablet 0  . venlafaxine XR (EFFEXOR-XR) 75 MG 24 hr capsule TAKE 1 CAPSULE (75 MG TOTAL) BY MOUTH DAILY WITH BREAKFAST. 90 capsule 0  . NOVOLIN R 100 UNIT/ML injection     . TRESIBA FLEXTOUCH 100 UNIT/ML FlexTouch Pen Inject 10 Units into the skin daily.     Current Facility-Administered Medications on File Prior to Visit  Medication Dose Route Frequency Provider Last Rate Last Admin  . LORazepam (ATIVAN) injection 1 mg  1 mg Intravenous Once Harle Stanford., PA-C      . omalizumab Arvid Right) injection 300 mg  300 mg Subcutaneous Q28 days Jiles Prows, MD   300 mg at 12/20/19 3825    Cardiac Studies:   Coronary CTA 01/01/2019: 1. Coronary artery calcium score 50 Agatston units. This places the patient in the 97th percentile for age and gender, suggesting high risk for future cardiac events. 2.  Nonobstructive coronary disease. 3. The RCA originates from the left cusp in close proximity to the left coronary and courses interarterially between the aortic root and proximal PA to reach typical RCA territory. Would consider treadmill stress testing to assess for provocative ischemia. 4. LAD system: Mixed plaque in the proximal LAD with mild (<50%) stenosis. Circumflex  system: Large PLOM, small AV LCx after take-off of PLOM. No plaque or stenosis.  Echocardiogram 07/06/2019:   Moderately depressed LV systolic function with visual EF 30-35%. Left  ventricle cavity is dilated. Left ventricle regional wall motion findings:  Basal anteroseptal, Basal anterior, Basal inferoseptal, Mid anteroseptal,  Mid anterior, Mid inferoseptal, Apical anterior, Apical septal and Apical  cap hypokinesis. Calculated EF 43%.  Mild (Grade I) aortic regurgitation.  Mild (Grade I) mitral regurgitation.  No other significant valvular abnormalities.  Consider limited echo with Definity or MUGA scan to re-evaluate left  ventricular function, if clinically indicated.   No significant change compared to prior study on 03/2019.   Echocardiogram 12/29/2019:  1. Left ventricular ejection fraction, by estimation, is 25 to 30%. The left ventricle has severely decreased function. The left ventricle demonstrates global hypokinesis. Left ventricular diastolic function could not be evaluated. 2. Right ventricular systolic function is normal. The right ventricular size is normal. 3. Left atrial size was mildly dilated. 4. The mitral valve is normal in structure. No evidence of mitral valve regurgitation. No evidence of mitral stenosis. 5. The aortic valve is normal in structure. Aortic valve regurgitation is not visualized. No aortic stenosis is present. 6. The inferior vena cava is normal in size with greater than 50% respiratory variability, suggesting right atrial pressure of 3 mmHg.  Comparison(s): Prior images unable to be directly viewed, comparison made by report only. Compared to 09/29/2019, EF has decreased from 40%. Findings consistent with cardiomyopathy.  EKG: EKG 07/14/2019: Normal sinus rhythm at rate of 71 bpm, normal axis, left bundle branch block.  No further analysis.  QRS duration 150 ms.  Compared to 01/13/2019, no significant change, heart rate has reduced from 83 bpm.  Assessment     ICD-10-CM   1. Essential hypertension  I10 PCV ECHOCARDIOGRAM COMPLETE  2. Coronary artery  calcification  I25.10    I25.84   3. 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 dapagliflozin propanediol (FARXIGA) 5 MG TABS tablet    Sig: Take 2 tablets (10 mg total) by mouth daily.    Dispense:  90 tablet    Refill:  3  . carvedilol (COREG) 12.5 MG tablet    Sig: Take 1 tablet (12.5 mg total) by mouth 2 (two) times daily.    Dispense:  180 tablet    Refill:  3  . rosuvastatin (CRESTOR) 20 MG tablet    Sig: Take 1 tablet (20 mg total) by mouth daily.    Dispense:  90 tablet    Refill:  3  . sacubitril-valsartan (ENTRESTO) 97-103 MG    Sig: Take 1 tablet by mouth 2 (two) times daily.    Dispense:  180 tablet    Refill:  3  . furosemide (LASIX) 40 MG tablet    Sig: Take 1 tablet (40 mg total) by mouth daily as needed for fluid (Shortness of breath).    Dispense:  30 tablet    Refill:  1    Medications Discontinued During This Encounter  Medication Reason  . doxycycline (VIBRA-TABS) 100 MG tablet Completed Course  . ondansetron (ZOFRAN) 4 MG tablet No longer needed (for PRN medications)  . dapagliflozin propanediol (FARXIGA) 5 MG  TABS tablet Reorder  . sacubitril-valsartan (ENTRESTO) 49-51 MG Dose change  . rosuvastatin (CRESTOR) 20 MG tablet   . carvedilol (COREG) 6.25 MG tablet Reorder  . furosemide (LASIX) 40 MG tablet     Recommendations:   PRIA KLOSINSKI  is a 49 y.o. Caucasian female initially with history of LBBB at least dating back to 2013. At that time she has had a negative nuclear stress test and echocardiogram revealed low normal LVEF at 45-50%.   Past medical history includes hyperthyroidism S/P RAI therapy in March 2018, depression, chronic low back pain, estrogen negative right breast cancer for  which he underwent chemotherapy(Adjuvant chemotherapy with dose dense Adriamycin and Cytoxan x4)  and also bilateral mastectomy with reconstruction surgery on 11/18/2017. Surveillance echocardiogram due to chemotherapy attributed gradual decrease of LVEF to moderate to severe LV systolic dysfunction of 30 to 35%. 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     She was admitted on 12/25/2019 with COVID-19 pneumonia and also had a prolonged hospitalization and discharged on 01/04/2020.  Repeat echocardiogram hospitalization again revealed decreased LVEF.  She also had acute CHF exacerbation along with acute respiratory distress from Covid pneumonia.  She now presents for follow-up.  From cardiac standpoint there is no acute decompensated heart failure.  Her LVEF has reduced back to baseline again since COVID-19, with aggressive medical therapy her EF had improved.  Delene Loll was also discontinued as she had developed hives.  She is now tolerating Entresto again and has not had any hives but she is also on steroids for her underlying reactive airway disease.  I will increase Entresto to 97/103 mg twice daily, Farxiga to 10 mg daily with regard to CHF.  Continue carvedilol 12.5 mg twice daily.  Will obtain labs including proBNP, BMP in 2 weeks after increasing Entresto.  I will repeat echocardiogram in 3 months and see her back at that time.  Advised her to change furosemide from daily to as needed use with leg edema or dyspnea. Weight loss again stressed to the patient.  This was a 40-minute encounter with review of hospital records, hospital labs and coordination.   Adrian Prows, MD, [facility] 01/24/2020, 12:43 PM Office: [phone]

## 2020-01-25 ENCOUNTER — Other Ambulatory Visit: Payer: Self-pay

## 2020-01-25 DIAGNOSIS — I427 Cardiomyopathy due to drug and external agent: Secondary | ICD-10-CM

## 2020-01-25 DIAGNOSIS — I5022 Chronic systolic (congestive) heart failure: Secondary | ICD-10-CM

## 2020-01-25 DIAGNOSIS — E1169 Type 2 diabetes mellitus with other specified complication: Secondary | ICD-10-CM

## 2020-01-25 MED ORDER — CARVEDILOL 12.5 MG PO TABS: 12.5000 mg | ORAL_TABLET | Freq: Two times a day (BID) | ORAL | 3 refills | Status: DC

## 2020-01-25 MED ORDER — DAPAGLIFLOZIN PROPANEDIOL 5 MG PO TABS: 10.0000 mg | ORAL_TABLET | Freq: Every day | ORAL | 1 refills | Status: DC

## 2020-01-31 ENCOUNTER — Telehealth: Payer: Self-pay | Admitting: Internal Medicine

## 2020-01-31 NOTE — Telephone Encounter (Signed)
Faxed Completed Leave of Absence forms to [phone] on 01/13/2020,  Claim # [facility] [phone]

## 2020-02-02 ENCOUNTER — Encounter: Payer: Self-pay | Admitting: Internal Medicine

## 2020-02-02 ENCOUNTER — Ambulatory Visit: Payer: No Typology Code available for payment source | Admitting: Internal Medicine

## 2020-02-02 VITALS — BP 110/70 | HR 83 | Temp 98.1°F | Ht 70.0 in | Wt 239.0 lb

## 2020-02-02 DIAGNOSIS — R0902 Hypoxemia: Secondary | ICD-10-CM | POA: Diagnosis not present

## 2020-02-02 DIAGNOSIS — U071 COVID-19: Secondary | ICD-10-CM

## 2020-02-02 DIAGNOSIS — I5022 Chronic systolic (congestive) heart failure: Secondary | ICD-10-CM | POA: Diagnosis not present

## 2020-02-02 DIAGNOSIS — R059 Cough, unspecified: Secondary | ICD-10-CM | POA: Diagnosis not present

## 2020-02-02 DIAGNOSIS — J1282 Pneumonia due to coronavirus disease 2019: Secondary | ICD-10-CM

## 2020-02-02 DIAGNOSIS — Z029 Encounter for administrative examinations, unspecified: Secondary | ICD-10-CM

## 2020-02-02 DIAGNOSIS — Z853 Personal history of malignant neoplasm of breast: Secondary | ICD-10-CM

## 2020-02-02 DIAGNOSIS — I427 Cardiomyopathy due to drug and external agent: Secondary | ICD-10-CM

## 2020-02-02 MED ORDER — BUDESONIDE-FORMOTEROL FUMARATE 160-4.5 MCG/ACT IN AERO
2.0000 | INHALATION_SPRAY | Freq: Two times a day (BID) | RESPIRATORY_TRACT | 3 refills | Status: DC
Start: 2020-02-02 — End: 2020-02-11

## 2020-02-02 MED ORDER — DM-GUAIFENESIN ER 30-600 MG PO TB12: 1.0000 | ORAL_TABLET | Freq: Two times a day (BID) | ORAL | 2 refills | Status: DC

## 2020-02-02 NOTE — Progress Notes (Signed)
   Subjective:    Patient ID: Rebecca [name], female    DOB: August 18, 1970, 49 y.o.   MRN: [id number]  HPI Here today to follow-up on COVID-19 infection with pneumonia requiring hospitalization and now on chronic oxygen therapy.  She was moving around taking dog out yesterday without using oxygen sat was 91 %. Going up stairs with oxygen was 90%. Getting over 4000 steps a day. Still out of work on disability leave.  Beginning to get strength back.  Saw Dr. Einar Gip on September 27.  When she was last here on September 22 we treated her with another round of steroids which seemed to help.  She has history of chronic systolic congestive heart failure from chemotherapy for breast cancer.  History of left bundle branch block dating back at least to 2013.  Had nuclear stress test and echocardiogram at that time.  Left ventricular ejection fraction was 45 to 50%.  Was treated with Adriamycin and Cytoxan for breast cancer in 2019.  Chemotherapy is attributed to decrease in left ventricular ejection fraction of 30 to 35%.  No history of coronary artery disease.  Was hospitalized from August 28 through September 7 with COVID-19 pneumonia and hypoxia.   Review of Systems see above     Objective:   Physical Exam  Blood pressure 110/70 pulse 83 temperature 98.1 degrees pulse oximetry 97% weight 239 pounds BMI 34.29  Skin warm and dry.  Nodes none.  Chest is clear to auscultation without rales or wheezing.  Cardiac exam regular rate and rhythm.  No lower extremity pitting edema.  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 Einar Gip.  Patient is on Entresto.  Also takes carvedilol  Fatigue with exertion  Glucose intolerance-stable  GE reflux treated with Nexium  Hypothyroidism treated with levothyroxine  History of urticaria-seems  to have improved recently  Hyperlipidemia treated with Crestor  Plan: Advised patient to remain out of work and she will return in late October for reevaluation.  She has had some respiratory congestion and a respiratory virus panel was performed today but no viruses were isolated on this panel.  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

## 2020-02-04 ENCOUNTER — Telehealth: Payer: Self-pay | Admitting: Internal Medicine

## 2020-02-04 LAB — BASIC METABOLIC PANEL
BUN: 16 mg/dL (ref 7–25)
CO2: 28 mmol/L (ref 20–32)
Calcium: 9.7 mg/dL (ref 8.6–10.4)
Chloride: 101 mmol/L (ref 98–110)
Creat: 0.96 mg/dL (ref 0.50–1.05)
Glucose, Bld: 92 mg/dL (ref 65–99)
Potassium: 4.3 mmol/L (ref 3.5–5.3)
Sodium: 139 mmol/L (ref 135–146)

## 2020-02-04 LAB — BRAIN NATRIURETIC PEPTIDE: Brain Natriuretic Peptide: 62 pg/mL (ref <100)

## 2020-02-04 LAB — RESPIRATORY VIRUS PANEL

## 2020-02-04 MED ORDER — GUAIFENESIN ER 600 MG PO TB12: 600.0000 mg | ORAL_TABLET | Freq: Two times a day (BID) | ORAL | 2 refills | Status: DC

## 2020-02-04 NOTE — Telephone Encounter (Signed)
Have called in Mucinex 600 mg twice a day. Other expectorant discontinued with Dextromethorphan. The Symbicort inhaler was sent already.

## 2020-02-04 NOTE — Telephone Encounter (Addendum)
Completed new set of Leave of Absence forms and faxed 21 pages to [phone] on 02/02/2020  Claim # [facility] [id number]

## 2020-02-11 ENCOUNTER — Ambulatory Visit (INDEPENDENT_AMBULATORY_CARE_PROVIDER_SITE_OTHER): Payer: No Typology Code available for payment source | Admitting: Internal Medicine

## 2020-02-11 ENCOUNTER — Other Ambulatory Visit: Payer: Self-pay

## 2020-02-11 ENCOUNTER — Encounter: Payer: Self-pay | Admitting: Internal Medicine

## 2020-02-11 VITALS — BP 110/80 | HR 101 | Temp 97.6°F | Ht 70.0 in | Wt 245.0 lb

## 2020-02-11 DIAGNOSIS — R0602 Shortness of breath: Secondary | ICD-10-CM

## 2020-02-11 DIAGNOSIS — Z23 Encounter for immunization: Secondary | ICD-10-CM | POA: Diagnosis not present

## 2020-02-11 DIAGNOSIS — U071 COVID-19: Secondary | ICD-10-CM | POA: Diagnosis not present

## 2020-02-11 MED ORDER — BUDESONIDE-FORMOTEROL FUMARATE 160-4.5 MCG/ACT IN AERO: 2.0000 | INHALATION_SPRAY | Freq: Two times a day (BID) | RESPIRATORY_TRACT | 5 refills | Status: DC

## 2020-02-11 NOTE — Progress Notes (Signed)
Rebecca [name]    [id number]    [birth year]  Primary Care Physician:Baxley, Cresenciano Lick, MD  Referring Physician: Elby Showers, MD [address] [place],   [id number] Reason for Consultation: shortness of breath Date of Consultation: 02/11/2020  Chief complaint:   Chief Complaint  Patient presents with  . Consult    12/24/19 pna, home with oxygen on 02/03/20.  sob with exertion.  dry cough randomly     HPI: Rebecca [name] is a 49 y.o. woman with past medical history of breast cancer s/p adriamycin and cytoxan and bilateral mastectomy and reconstructive surgery in July 2019.  She has had resultant HFrEF EF 30-35% secondary to adriamycin useShe also has a history of tobacco use quitting in 2006 and history of urticaria treated with Xolair, now resolved.  Hospitalized for covid 19 infection for over a week in September 2021.  Discharged on home oxygen 6LNC.  Was also treated for CHF exacerbation. Treated with steroids, remdesevir, MAB.  Since being home she feels a little bit better.  She has dropped her oxygen down to 3 L nasal cannula. With Exertion without oxygen she has dropped down to 83%.. She feels winded with exertion very easily.  She has passed out while not wearing oxygen at home, and finds her self be extremely winded when she goes to [employer] with her oxygen. Occasional cough which is dry and random.    Eating and drinking okay.  Weights have been stable since she left the hospital, no swelling in her legs. She sleeps with one pillow at night. She sleeps slightly elevated on a sleep number bed.   Prior to Covid she has a history of lingering cough after URIs every year.  Never been hospitalized for breathing.  No asthma or bronchitis in the family.    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 Smoking history:Former smoker - started [age] and quit in 2006  0.5 ppd. Passive smoke exposure.    Social History   Occupational History  . Occupation: Civil Service fast streamer  Tobacco Use  . Smoking status: Former Smoker    Packs/day: 0.25    Years: 6.00    Pack years: 1.50    Types: Cigarettes    Quit date: 03/29/2005    Years since quitting: 14.8  . Smokeless tobacco: Never Used  Vaping Use  . Vaping Use: Never used  Substance and Sexual Activity  . Alcohol use: Yes    Comment: socially  . Drug use: No  . Sexual activity: Yes    Birth control/protection: Surgical    Comment: BTL    Relevant family history:  Family History  Problem Relation Age of Onset  . Lymphoma Father   . Cancer Father        lymphoma  . Diabetes Brother   . Diabetes Mother   . Liver disease Mother        NASH, d. 56  . Asthma Mother   . CAD Maternal Grandmother   . CAD Maternal Grandfather   . Stomach cancer Neg Hx   . Colon cancer Neg Hx   . Colon polyps Neg Hx   . Esophageal cancer Neg Hx   . Rectal cancer Neg Hx     Past Medical History:  Diagnosis Date  . Allergy    constant hives post chemo therapy.  All allergen tests have been neg.  Marland Kitchen  Anxiety   . Breast cancer ([place])   . Coronary artery disease   . Diabetes mellitus without complication ([place])   . Diverticulitis   . GERD (gastroesophageal reflux disease)   . Hiatal hernia   . Hyperlipidemia   . Hypertension   . Hypothyroidism   . LBBB (left bundle branch block)   . Pneumonia     Past Surgical History:  Procedure Laterality Date  . BREAST RECONSTRUCTION WITH PLACEMENT OF TISSUE EXPANDER AND ALLODERM Bilateral 11/18/2017   Procedure: BILATERAL BREAST RECONSTRUCTION WITH PLACEMENT OF TISSUE EXPANDER AND ALLODERM;  Surgeon: Irene Limbo, MD;  Location: [place];  Service: Plastics;  Laterality: Bilateral;  . CESAREAN SECTION  2007/2010   x2  . COLONOSCOPY  2017  . LAPAROSCOPIC TUBAL LIGATION  06/12/2011   Procedure: LAPAROSCOPIC TUBAL LIGATION;  Surgeon: Daria Pastures,  MD;  Location: [facility];  Service: Gynecology;  Laterality: N/A;  filshie clip  . LIPOSUCTION WITH LIPOFILLING Bilateral 02/24/2018   Procedure: LIPOFILLING FROM ABDOMEN TO BILATERAL CHEST;  Surgeon: Irene Limbo, MD;  Location: [place];  Service: Plastics;  Laterality: Bilateral;  . MASTECTOMY W/ SENTINEL NODE BIOPSY Bilateral 11/18/2017   Procedure: BILATERAL TOTAL MASTECTOMIES WITH RIGHT SENTINEL LYMPH NODE BIOPSY;  Surgeon: Rolm Bookbinder, MD;  Location: [place];  Service: General;  Laterality: Bilateral;  . mini tuck  2012   abdominal  . PORT-A-CATH REMOVAL Right 01/05/2019  . PORTACATH PLACEMENT Right 07/15/2017   Procedure: INSERTION PORT-A-CATH WITH ULTRASOUND;  Surgeon: Rolm Bookbinder, MD;  Location: [place];  Service: General;  Laterality: Right;  . REMOVAL OF BILATERAL TISSUE EXPANDERS WITH PLACEMENT OF BILATERAL BREAST IMPLANTS Bilateral 02/24/2018   Procedure: REMOVAL OF BILATERAL TISSUE EXPANDERS WITH PLACEMENT OF BILATERAL BREAST IMPLANTS;  Surgeon: Irene Limbo, MD;  Location: [place];  Service: Plastics;  Laterality: Bilateral;  . right breast cancer     upper-outer quadrant   . right ear surg  03/2008   Mohs  . TUBAL LIGATION    . US ECHOCARDIOGRAPHY  12-30-2007   EF 55-60%  . WISDOM TOOTH EXTRACTION       Physical Exam: Blood pressure 110/80, pulse (!) 101, temperature 97.6 F (36.4 C), temperature source Temporal, height 5\' 10"  (1.778 m), weight 245 lb (111.1 kg), SpO2 99 %. Gen:      No acute distress, on oxygen ENT:  no nasal polyps, mucus membranes moist Lungs:    No increased respiratory effort, symmetric chest wall excursion, clear to auscultation bilaterally, no wheezes or crackles CV:         Regular rate and rhythm; no murmurs, rubs, or gallops.  No pedal edema Abd:      + bowel sounds; soft, non-tender; no distension MSK: no acute synovitis of DIP or PIP joints, no mechanics  hands.  Skin:      Warm and dry; no rashes Neuro: normal speech, no focal facial asymmetry Psych: alert and oriented x3, normal mood and affect   Data Reviewed/Medical Decision Making:  Independent interpretation of tests: Imaging: . Review of patient's CT chest images revealed multifocal groundglass opacities consistent with COVID-19 pneumonia. The patient's images have been independently reviewed by me.    PFTs: Labs:  Lab Results  Component Value Date   WBC 6.7 01/12/2020   HGB 11.4 (L) 01/12/2020   HCT 35.9 01/12/2020   MCV 85.1 01/12/2020   PLT 250 01/12/2020   Lab Results  Component Value Date  NA 139 02/02/2020   K 4.3 02/02/2020   CL 101 02/02/2020   CO2 28 02/02/2020    Immunization status:  Immunization History  Administered Date(s) Administered  . Influenza, Quadrivalent, Recombinant, Inj, Pf 01/31/2018  . Influenza,inj,Quad PF,6+ Mos 02/04/2016, 01/23/2019, 02/11/2020  . Influenza,inj,quad, With Preservative 02/10/2017    . I reviewed prior external note(s) from hospital stay . I reviewed the result(s) of the labs and imaging as noted above.  . I have ordered CT chest   Assessment:  Shortness of breath COVID-19 pneumonia Heart failure reduced ejection fraction secondary to Adriamycin toxicity History of breast cancer History of tobacco use, likely COPD  Plan/Recommendations: The patient is a 49 year old woman with a complicated medical history that has had substantial debility following her COVID-19 diagnosis.  Her CT scan shows ongoing groundglass opacities few weeks out from her diagnosis.  At this point I feel we are dealing mostly with inflammation after her Covid infection.  Reasonable to resume taking Symbicort twice daily as since she probably has some smoking-related lung disease and this will also help reduce inflammation from her pneumonia.  She can also keep taking albuterol as needed.  At some point she will need PFTs but I would wait till  she is out of this acute phase and at least 90 days out from her positive Covid test.  She can get pelvic vaccinated at that point as well.  We will get a CT scan of her chest in December towards the end of the month and she will see me again after that.  She can get her flu shot today.  We discussed disease management and progression at length today.   I spent 62 minutes in the care of this patient today including pre-charting, chart review, review of results, face-to-face care, coordination of care and communication with consultants etc.).   Return to Care: Return in about 2 months (around 04/19/2020).  Lenice Llamas, MD Pulmonary and [place]  CC: Baxley, Cresenciano Lick, MD

## 2020-02-11 NOTE — Progress Notes (Deleted)
History of tobacco use Urticaria previously   With Exertion without oxygen she has dropped down to 83%. Left the hospital on [facility] and has weaned herself down to [facility]. She feels winded with exertion very easily.  She has passed out while not wearing oxygen. Occasional cough which is dry and random.    Eating and drinking. Weights have bene stable since she left the hospital, no swelling in her legs. She sleeps with one pillow at night. She sleeps slightly elevated on a sleep number bed.   Former smoker - started [age] and quit in 2006 0.5 ppd. Passive smoke exposure.    No prior  History of pneumonia or bronchitis.  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  No asthma or bronchitis in the family.   Tends to have lingering cough

## 2020-02-11 NOTE — Patient Instructions (Signed)
The patient should have follow up scheduled with myself in 3 months after CT scan.  Prior to next visit patient should have: CT scan end of December.  Continue taking the symbicort.  Take the albuterol rescue inhaler every 4 to 6 hours as needed for wheezing or shortness of breath. You can also take it 15 minutes before exercise or exertional activity. Side effects include heart racing or pounding, jitters or anxiety. If you have a history of an irregular heart rhythm, it can make this worse. Can also give some patients a hard time sleeping.  You can get the covid vaccine 90 days after your positive test.

## 2020-02-14 NOTE — Telephone Encounter (Signed)
Dr. Shearon Stalls, please see pt's mychart message and advise.

## 2020-02-15 ENCOUNTER — Telehealth: Payer: Self-pay | Admitting: Internal Medicine

## 2020-02-15 NOTE — Telephone Encounter (Signed)
Cannot get electronic Rx to go through but have called in Triamcinolone 0.1% 60 grams in 4 oz Eucerin cream to use tid pan itching with one refill. MJB

## 2020-02-16 ENCOUNTER — Other Ambulatory Visit: Payer: Self-pay | Admitting: Internal Medicine

## 2020-02-22 ENCOUNTER — Other Ambulatory Visit: Payer: No Typology Code available for payment source

## 2020-02-24 ENCOUNTER — Other Ambulatory Visit: Payer: Self-pay

## 2020-02-24 ENCOUNTER — Encounter: Payer: Self-pay | Admitting: Internal Medicine

## 2020-02-24 ENCOUNTER — Ambulatory Visit (INDEPENDENT_AMBULATORY_CARE_PROVIDER_SITE_OTHER): Payer: No Typology Code available for payment source | Admitting: Internal Medicine

## 2020-02-24 VITALS — BP 102/78 | HR 84 | Temp 98.6°F | Ht 70.0 in | Wt 245.0 lb

## 2020-02-24 DIAGNOSIS — E7439 Other disorders of intestinal carbohydrate absorption: Secondary | ICD-10-CM

## 2020-02-24 DIAGNOSIS — J1282 Pneumonia due to coronavirus disease 2019: Secondary | ICD-10-CM

## 2020-02-24 DIAGNOSIS — U071 COVID-19: Secondary | ICD-10-CM

## 2020-02-24 DIAGNOSIS — I427 Cardiomyopathy due to drug and external agent: Secondary | ICD-10-CM

## 2020-02-24 DIAGNOSIS — R0902 Hypoxemia: Secondary | ICD-10-CM | POA: Diagnosis not present

## 2020-02-24 MED ORDER — CLOTRIMAZOLE-BETAMETHASONE 1-0.05 % EX CREA: 1.0000 "application " | TOPICAL_CREAM | Freq: Two times a day (BID) | CUTANEOUS | 3 refills | Status: DC

## 2020-02-24 MED ORDER — PREDNISONE 10 MG PO TABS: ORAL_TABLET | ORAL | 0 refills | Status: DC

## 2020-02-24 MED ORDER — LORAZEPAM 1 MG PO TABS
ORAL_TABLET | ORAL | 2 refills | Status: DC
Start: 2020-02-24 — End: 2021-03-12

## 2020-02-24 MED ORDER — DOXYCYCLINE HYCLATE 100 MG PO TABS: 100.0000 mg | ORAL_TABLET | Freq: Two times a day (BID) | ORAL | 0 refills | Status: DC

## 2020-02-24 NOTE — Patient Instructions (Signed)
It was a pleasure to see you today.  Take doxycycline for 10 days twice daily.  Take prednisone in tapering course over 12 days as directed.  Return in 2 weeks.  Continue to use home oxygen when doing physical activity.

## 2020-02-24 NOTE — Progress Notes (Signed)
   Subjective:    Patient ID: Rebecca [name], female    DOB: October 22, 1970, 49 y.o.   MRN: [id number]  HPI 49 year old Female for follow up on Covid-19 pneumonia requiring hospitalization August 28 through September 7.  At time of discharge she was sent home on 6 L of oxygen via nasal cannula.  She has been followed here closely and also by Dr. Einar Gip, her cardiologist.  History of chronic systolic heart failure due to chemotherapy for breast cancer.  She recently saw a pulmonologist.  In September was treated with a prednisone taper starting on September 22 and prior to that was on doxycycline on September 15.  Patient is feeling much better.  Is able to sit without using her oxygen but walking causes hypoxia.  Pulse oximetry dropped to 91% when walking in the hall here today.  She has been able to decrease oxygen to 3 L/min.  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  She previously smoked from age 71 or 72 until 2006 about 1/2 pack/day.  Has had issues with hives but that seems to be better since she has been ill with Covid.  She saw a pulmonologist October 15 who felt there was residual inflammation from COVID-19 infection.  Advised taking Symbicort twice daily as patient probably has some smoking-related lung disease and it would also help reduce inflammation from her pneumonia.  Also advised taking albuterol as needed.  I suggested pulmonary function test in some 90 days from admission.  They also plan to get CT of her chest in December.  She is to be followed up by Pulmonology in December.  Patient feels that antibiotics and steroids helped her a great deal and would like to try another round of this to see if her oxygen level will improve.  She remains out of work and we will likely need to continue FMLA at least another month.  This is frustrating for her because she would like to get back to work.  Was working from home previously due to COVID-19  pandemic.  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  She does this a couple of nights a week.  She wears her oxygen and says O2 sats have been stable without activity.   Review of Systems see above     Objective:   Physical Exam Blood pressure 102/78 pulse 84 temperature 98.6 degrees pulse oximetry 97% weight 245 pounds.  BMI 35.15.  Her weight is increased 6 pounds from last visit.  Skin warm and dry.  No cervical adenopathy.  Chest clear to auscultation.  No lower extremity pitting edema.  Cardiac exam: Regular rate and rhythm.      Assessment & Plan:  COVID-19 pneumonia with persistent oxygen requirement.  Has decreased from 6 L/min to 3 L/min.  O2 sat falls with exertion.  Continue home oxygen.  She was given doxycycline 100 mg twice daily for 10 days and a tapering course of prednisone starting with 60 mg day 1 and decreasing by 1 tablet every 2 days.  She will need to monitor Accu-Cheks closely.  History of Diabetes mellitus-continue current treatment and continue to monitor Accu-Cheks closely  History of chemotherapy induced congestive heart failure followed by Cardiology.    She would like to follow-up here in 2 weeks.  Explained to her it was likely that she would be out of work an additional month at this point.

## 2020-02-27 NOTE — Patient Instructions (Addendum)
Respiratory virus panel obtained.  Continue current medications and follow-up in late October.  Rest and take care of yourself.  Avoid unnecessary exposure at [facility] if possible.  Continue oxygen therapy.  Continue walking and gait strengthening.  Follow-up in late October.  Remain out of work on disability until rechecked.

## 2020-03-09 ENCOUNTER — Other Ambulatory Visit: Payer: Self-pay

## 2020-03-09 ENCOUNTER — Encounter: Payer: Self-pay | Admitting: Internal Medicine

## 2020-03-09 ENCOUNTER — Ambulatory Visit (INDEPENDENT_AMBULATORY_CARE_PROVIDER_SITE_OTHER): Payer: No Typology Code available for payment source | Admitting: Internal Medicine

## 2020-03-09 VITALS — BP 110/80 | HR 80 | Temp 98.8°F | Ht 70.0 in | Wt 250.0 lb

## 2020-03-09 DIAGNOSIS — Z853 Personal history of malignant neoplasm of breast: Secondary | ICD-10-CM

## 2020-03-09 DIAGNOSIS — J1282 Pneumonia due to coronavirus disease 2019: Secondary | ICD-10-CM

## 2020-03-09 DIAGNOSIS — E039 Hypothyroidism, unspecified: Secondary | ICD-10-CM

## 2020-03-09 DIAGNOSIS — R5381 Other malaise: Secondary | ICD-10-CM

## 2020-03-09 DIAGNOSIS — J029 Acute pharyngitis, unspecified: Secondary | ICD-10-CM | POA: Diagnosis not present

## 2020-03-09 DIAGNOSIS — K120 Recurrent oral aphthae: Secondary | ICD-10-CM | POA: Diagnosis not present

## 2020-03-09 DIAGNOSIS — I427 Cardiomyopathy due to drug and external agent: Secondary | ICD-10-CM

## 2020-03-09 DIAGNOSIS — U071 COVID-19: Secondary | ICD-10-CM

## 2020-03-09 DIAGNOSIS — E1169 Type 2 diabetes mellitus with other specified complication: Secondary | ICD-10-CM

## 2020-03-09 DIAGNOSIS — Z8616 Personal history of COVID-19: Secondary | ICD-10-CM

## 2020-03-09 DIAGNOSIS — E669 Obesity, unspecified: Secondary | ICD-10-CM

## 2020-03-09 LAB — POCT RAPID STREP A (OFFICE): Rapid Strep A Screen: NEGATIVE

## 2020-03-09 MED ORDER — VENLAFAXINE HCL ER 150 MG PO CP24: 150.0000 mg | ORAL_CAPSULE | Freq: Every day | ORAL | 0 refills | Status: DC

## 2020-03-09 NOTE — Telephone Encounter (Addendum)
Faxed return to work on 03/20/2020 -- letter to [phone] Ref [phone]

## 2020-03-09 NOTE — Progress Notes (Signed)
   Subjective:    Patient ID: Rebecca [name], female    DOB: [birth year], 49 y.o.   MRN: [id number]  HPI 49 year old Female seen today for follow up on Covid-19 infection with pneumonia requiring hospitalization August 28 through September 7. She still is requiring portable oxygen at times for shortness of breath. She desaturates walking on room air down the hall in my office to 94% and looks tired and short of breath with minimal exertion.  She has made considerable progress and is eager to return to work. However, I am concerned she may become fatigued when she returns to work. I would like for her to take an additional week off work and continue to work on regaining her stamina with walking outside more.  She has upcoming appointment with Cardiologist in late December. It is imperative to note that not only has she had Covid-19 pneumonia with persistent lung abnormalities on CT in September but also has a chemotherapy induced cardiomyopathy that she had prior to contracting Covid. It takes weeks to get over Covid pneumonia alone and she also has a heart condition from chemotherpy due to breast cancer. 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 It will not be easy early on doing all of these activities, and I think it would be wise for her to take an additional week or more but she is anxious to return to work so we will see how it goes. It frequently take 3 months to get over Covid-19 pneumonia and it has not been 3 months yet. She was admitted August 28th to  [facility], deteriorated rapidly to the point she was transferred to the ICU. She has had follow up appt with Pulmonary physician who agrees with this assessment and her Cardiologist does as well    Review of Systems see above- she has a sore throat and has noticed a lesion on her uvula.     Objective:   Physical Exam BP 110/80 pulse 80 T98.8 degrees  weight 250 pounds. Ht 70ft 10 in. Pulse ox resting 95% She has apparent apthous type ulcer on her uvula which may be viral. Walking down the hall on room air, her oxygen sat drops from 96% to 94 % and she looks slightly winded with minimal exercise in this office hallway. Chest is clear without rales or wheezes. Cor:RRR without ectopy No LE edema     Assessment & Plan:  She still has residual effects of Covid-19 pneumonia with desaturation of Oxygen level with minimal exercise  Also has apthous ulcer on uvula - possible Viral syndrome/pharyngitis- rapid strep screen is negative today. No antibiotics given.  She wants to go back to work full time but it may be a challenge for her as she still has deconditioning and oxygen desaturation issues. I would like for her to take an additional week off work to recover from

## 2020-03-13 ENCOUNTER — Telehealth: Payer: Self-pay | Admitting: Internal Medicine

## 2020-03-13 NOTE — Telephone Encounter (Addendum)
Rebecca [name] [phone]  [name] called to say that her job wants a more detailed reason as to why she is out of work this week.

## 2020-03-13 NOTE — Telephone Encounter (Signed)
I am covering this in my progress note

## 2020-03-14 NOTE — Telephone Encounter (Addendum)
Progress Note has been faxed to [place] [phone]  [phone]

## 2020-03-14 NOTE — Patient Instructions (Signed)
Take an additional week off work. Try to walk some more and improve stamina. Sore throat thought to be viral and should resolve. See cardiologist next month.

## 2020-03-19 ENCOUNTER — Other Ambulatory Visit: Payer: Self-pay | Admitting: Cardiology

## 2020-03-19 DIAGNOSIS — I5022 Chronic systolic (congestive) heart failure: Secondary | ICD-10-CM

## 2020-03-29 ENCOUNTER — Other Ambulatory Visit: Payer: Self-pay

## 2020-03-29 DIAGNOSIS — Z171 Estrogen receptor negative status [ER-]: Secondary | ICD-10-CM

## 2020-03-29 DIAGNOSIS — C50411 Malignant neoplasm of upper-outer quadrant of right female breast: Secondary | ICD-10-CM

## 2020-03-29 NOTE — Progress Notes (Signed)
Verbal orders for CT CAP W Contrast given by MD -  RN placed orders, awaiting authorization.

## 2020-03-30 ENCOUNTER — Other Ambulatory Visit: Payer: Self-pay | Admitting: *Deleted

## 2020-03-30 ENCOUNTER — Encounter: Payer: Self-pay | Admitting: Hematology and Oncology

## 2020-04-12 ENCOUNTER — Telehealth: Payer: Self-pay | Admitting: Internal Medicine

## 2020-04-12 NOTE — Telephone Encounter (Signed)
Called to let patient know that her insurance [employer] was recommending that she have an eye exam since she has recent been diagnosed with diabetes.

## 2020-04-14 ENCOUNTER — Ambulatory Visit (HOSPITAL_COMMUNITY): Payer: No Typology Code available for payment source

## 2020-04-17 ENCOUNTER — Ambulatory Visit (HOSPITAL_COMMUNITY): Payer: No Typology Code available for payment source

## 2020-04-18 ENCOUNTER — Ambulatory Visit: Payer: No Typology Code available for payment source

## 2020-04-18 ENCOUNTER — Other Ambulatory Visit: Payer: Self-pay

## 2020-04-18 ENCOUNTER — Other Ambulatory Visit: Payer: No Typology Code available for payment source

## 2020-04-18 DIAGNOSIS — I5022 Chronic systolic (congestive) heart failure: Secondary | ICD-10-CM

## 2020-04-20 ENCOUNTER — Ambulatory Visit: Payer: No Typology Code available for payment source | Admitting: Cardiology

## 2020-04-24 ENCOUNTER — Ambulatory Visit: Payer: Self-pay | Admitting: Cardiology

## 2020-05-08 DIAGNOSIS — E782 Mixed hyperlipidemia: Secondary | ICD-10-CM

## 2020-05-08 DIAGNOSIS — I10 Essential (primary) hypertension: Secondary | ICD-10-CM

## 2020-05-08 DIAGNOSIS — E1169 Type 2 diabetes mellitus with other specified complication: Secondary | ICD-10-CM

## 2020-05-08 DIAGNOSIS — E669 Obesity, unspecified: Secondary | ICD-10-CM

## 2020-05-08 DIAGNOSIS — I5022 Chronic systolic (congestive) heart failure: Secondary | ICD-10-CM

## 2020-05-08 NOTE — Telephone Encounter (Signed)
ICD-10-CM   1. Chronic systolic heart failure (HCC)  I50.22 Brain natriuretic peptide    Brain natriuretic peptide  2. Diabetes mellitus type 2 in obese (HCC)  E11.69 Hgb A1c w/o eAG   E66.9 Hgb A1c w/o eAG  3. Mixed hyperlipidemia  E78.2 Lipid Panel With LDL/HDL Ratio    Lipid Panel With LDL/HDL Ratio  4. Essential hypertension  I10 TSH+T4F+T3Free    CBC    CMP14+EGFR    CMP14+EGFR    CBC    TSH+T4F+T3Free     Jay Ganji, MD, FACC 05/08/2020, 6:06 PM Office: [phone] Pager: [phone]  

## 2020-05-12 LAB — CMP14+EGFR
ALT: 16 IU/L (ref 0–32)
AST: 14 IU/L (ref 0–40)
Albumin/Globulin Ratio: 2 (ref 1.2–2.2)
Albumin: 4.3 g/dL (ref 3.8–4.8)
Alkaline Phosphatase: 68 IU/L (ref 44–121)
BUN/Creatinine Ratio: 18 (ref 9–23)
BUN: 16 mg/dL (ref 6–24)
Bilirubin Total: 0.3 mg/dL (ref 0.0–1.2)
CO2: 25 mmol/L (ref 20–29)
Calcium: 9.5 mg/dL (ref 8.7–10.2)
Chloride: 99 mmol/L (ref 96–106)
Creatinine, Ser: 0.9 mg/dL (ref 0.57–1.00)
GFR calc Af Amer: 86 mL/min/{1.73_m2} (ref >59)
GFR calc non Af Amer: 75 mL/min/{1.73_m2} (ref >59)
Globulin, Total: 2.1 g/dL (ref 1.5–4.5)
Glucose: 98 mg/dL (ref 65–99)
Potassium: 4.6 mmol/L (ref 3.5–5.2)
Sodium: 140 mmol/L (ref 134–144)
Total Protein: 6.4 g/dL (ref 6.0–8.5)

## 2020-05-12 LAB — LIPID PANEL WITH LDL/HDL RATIO
Cholesterol, Total: 151 mg/dL (ref 100–199)
HDL: 47 mg/dL (ref >39)
LDL Chol Calc (NIH): 77 mg/dL (ref 0–99)
LDL/HDL Ratio: 1.6 ratio (ref 0.0–3.2)
Triglycerides: 160 mg/dL — ABNORMAL HIGH (ref 0–149)
VLDL Cholesterol Cal: 27 mg/dL (ref 5–40)

## 2020-05-12 LAB — CBC
Hematocrit: 39.8 % (ref 34.0–46.6)
Hemoglobin: 13.3 g/dL (ref 11.1–15.9)
MCH: 27.9 pg (ref 26.6–33.0)
MCHC: 33.4 g/dL (ref 31.5–35.7)
MCV: 83 fL (ref 79–97)
Platelets: 261 10*3/uL (ref 150–450)
RBC: 4.77 x10E6/uL (ref 3.77–5.28)
RDW: 12.9 % (ref 11.7–15.4)
WBC: 6 10*3/uL (ref 3.4–10.8)

## 2020-05-12 LAB — TSH+T4F+T3FREE
Free T4: 1.77 ng/dL (ref 0.82–1.77)
T3, Free: 4.1 pg/mL (ref 2.0–4.4)
TSH: 0.443 u[IU]/mL — ABNORMAL LOW (ref 0.450–4.500)

## 2020-05-12 LAB — BRAIN NATRIURETIC PEPTIDE: BNP: 32.1 pg/mL (ref 0.0–100.0)

## 2020-05-12 LAB — HGB A1C W/O EAG: Hgb A1c MFr Bld: 7 % — ABNORMAL HIGH (ref 4.8–5.6)

## 2020-05-17 ENCOUNTER — Telehealth: Payer: Self-pay | Admitting: Internal Medicine

## 2020-05-17 NOTE — Telephone Encounter (Signed)
LVM to CB to schedule labs for TSH in 4 weeks we can do this or endocrinologist.

## 2020-05-18 ENCOUNTER — Other Ambulatory Visit: Payer: Self-pay | Admitting: Cardiology

## 2020-05-18 DIAGNOSIS — I5022 Chronic systolic (congestive) heart failure: Secondary | ICD-10-CM

## 2020-05-24 ENCOUNTER — Telehealth: Payer: Self-pay | Admitting: Internal Medicine

## 2020-05-24 NOTE — Telephone Encounter (Signed)
Called and spoke with Rebecca [name] per [place] [employer] [place] message from husband, she stated that she was fine. She said that she had some really good days where she felt like her old self then she had a couple of days where she felt not so good. She told him that she was fine and she needed to wait until she has her CT on 06/05/2020 because there are no new symptoms just what she has been dealing with since she had COVID-19. I ask her to please call us or pulmonary if she started having any symptoms to not wait and to keep an eye on her O2 stats, she said the were 97 at the time of [place] talking. She verbalized understanding.

## 2020-05-24 NOTE — Telephone Encounter (Signed)
Patient had called and left message with endo and they had not called her back so we schedule an appointment for labs for TSH

## 2020-05-31 ENCOUNTER — Other Ambulatory Visit: Payer: Self-pay | Admitting: Internal Medicine

## 2020-06-05 ENCOUNTER — Ambulatory Visit: Payer: No Typology Code available for payment source | Admitting: Cardiology

## 2020-06-07 ENCOUNTER — Encounter (HOSPITAL_COMMUNITY): Payer: Self-pay

## 2020-06-07 ENCOUNTER — Ambulatory Visit (HOSPITAL_COMMUNITY)
Admission: RE | Admit: 2020-06-07 | Discharge: 2020-06-07 | Disposition: A | Discharge status: Home / Self Care | Payer: No Typology Code available for payment source | Source: Ambulatory Visit | Attending: Hematology and Oncology | Admitting: Hematology and Oncology

## 2020-06-07 ENCOUNTER — Encounter: Payer: Self-pay | Admitting: Hematology and Oncology

## 2020-06-07 ENCOUNTER — Other Ambulatory Visit: Payer: Self-pay

## 2020-06-07 DIAGNOSIS — Z171 Estrogen receptor negative status [ER-]: Secondary | ICD-10-CM | POA: Insufficient documentation

## 2020-06-07 DIAGNOSIS — C50411 Malignant neoplasm of upper-outer quadrant of right female breast: Secondary | ICD-10-CM | POA: Insufficient documentation

## 2020-06-07 IMAGING — CT CT CHEST-ABD-PELV W/ CM
3 of 5 series · 15 of 36 positions shown, 17 images · IV contrast (OMNIPAQUE)
Comparison: CT [DATE] and [DATE].

CLINICAL DATA: Restaging breast cancer diagnosed in [VM].
Chemotherapy completed.

EXAM:
CT CHEST, ABDOMEN, AND PELVIS WITH CONTRAST
TECHNIQUE: Multidetector CT imaging of the chest, abdomen and pelvis was
performed following the standard protocol during bolus
administration of intravenous contrast.
CONTRAST:  100mL OMNIPAQUE IOHEXOL 300 MG/ML  SOLN

[Series 2: cap with · axial · 0.80mm/px · z∈[-374,+166]mm · 10 of 134 slices shown, 12 images]
[im 13/134  mediastinal]
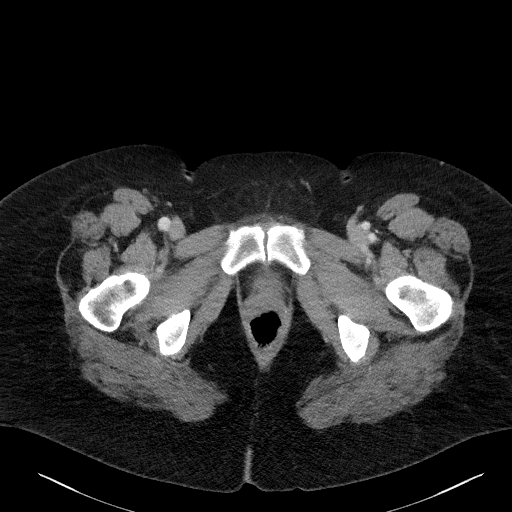
[im 13/134  bone]
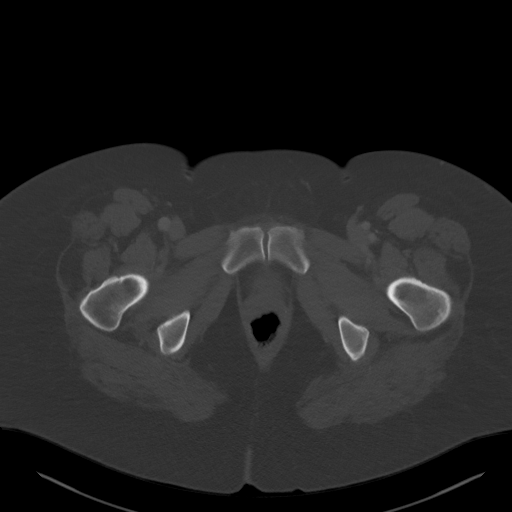
[im 25/134  mediastinal]
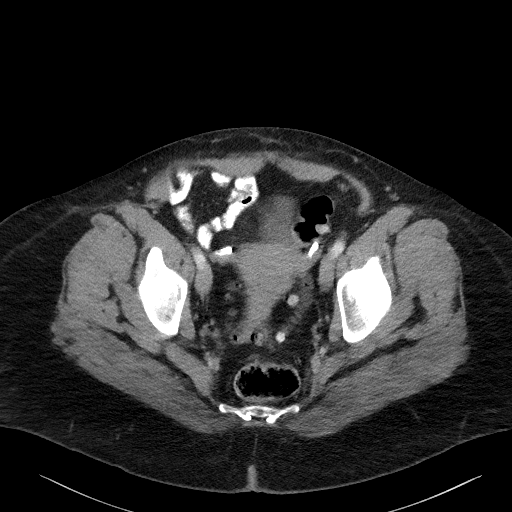
[im 37/134  mediastinal]
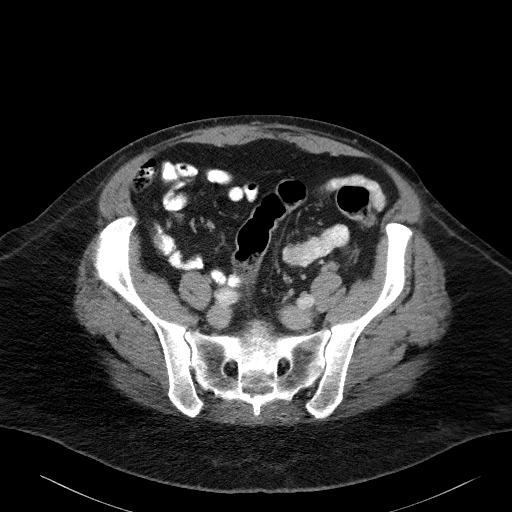
[im 49/134  mediastinal]
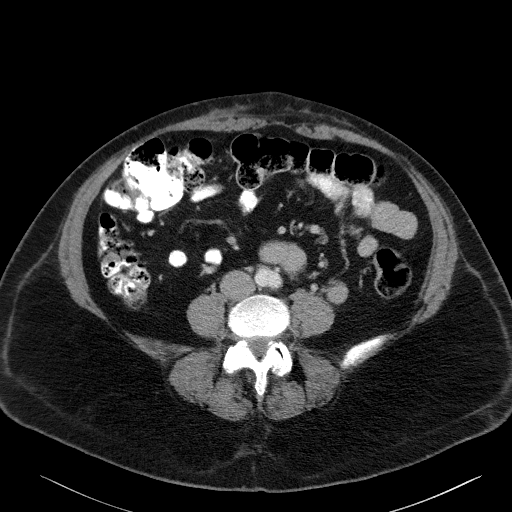
[im 61/134  mediastinal]
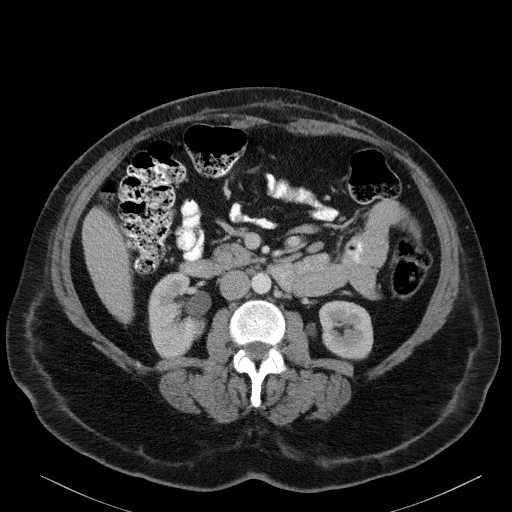
[im 73/134  mediastinal]
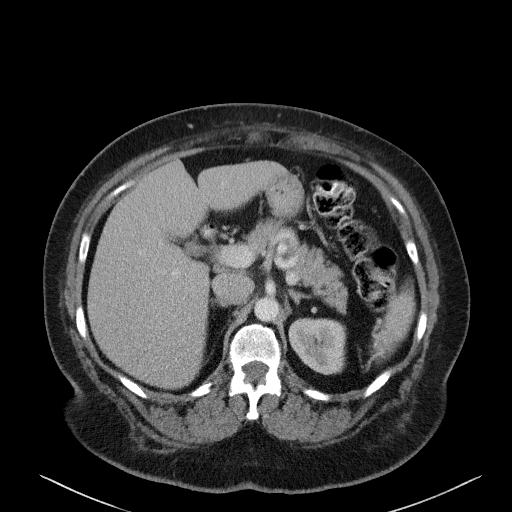
[im 85/134  mediastinal]
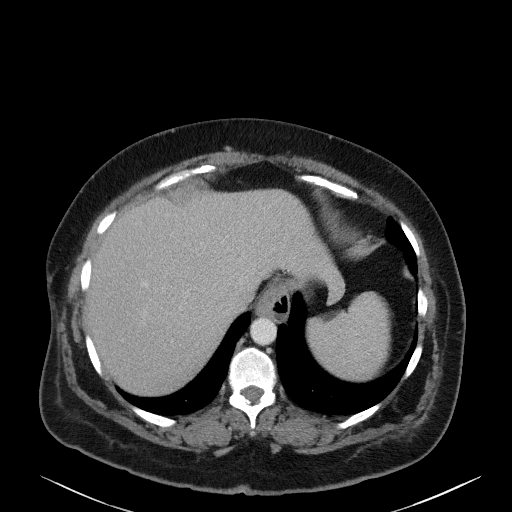
[im 97/134  mediastinal]
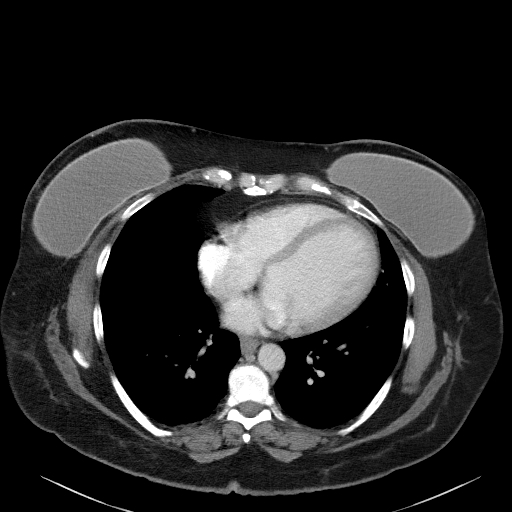
[im 109/134  mediastinal]
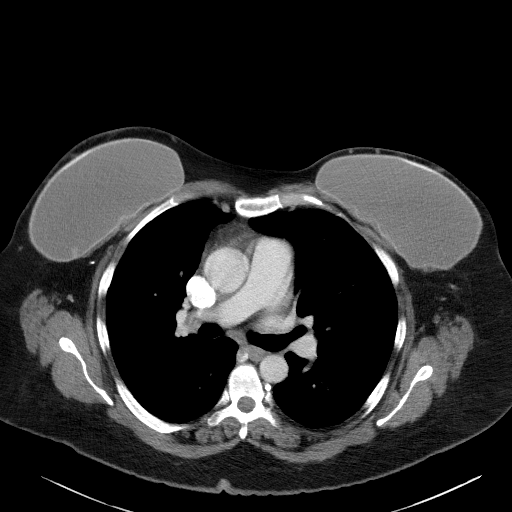
[im 109/134  bone]
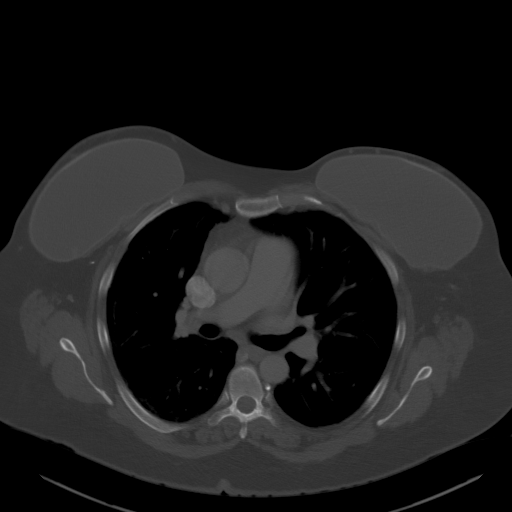
[im 121/134  mediastinal]
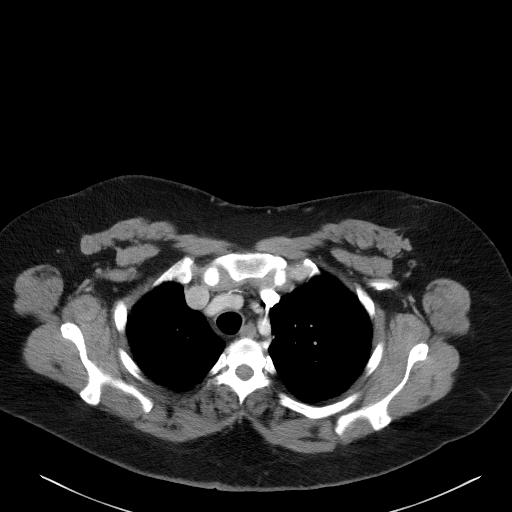

[Series 4: coronals · coronal · 1.10mm/px · 3 of 146 slices shown]
[im 30/146  mediastinal]
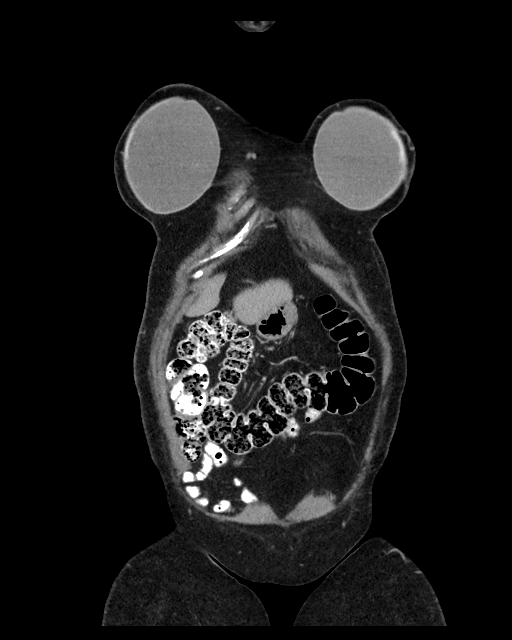
[im 59/146  mediastinal]
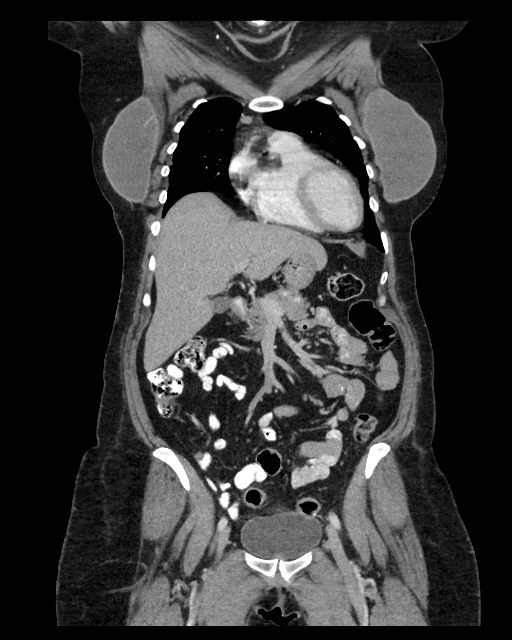
[im 88/146  mediastinal]
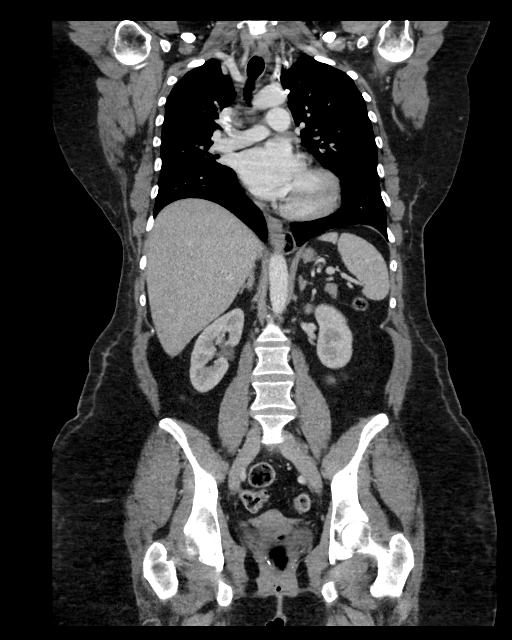

[Series 6: lung · axial · 0.80mm/px · z∈[-64,-12]mm · 2 of 155 slices shown]
[im 13/155  bone]
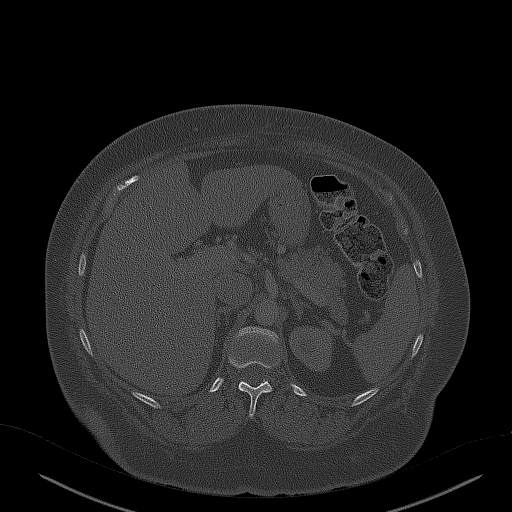
[im 39/155  bone]
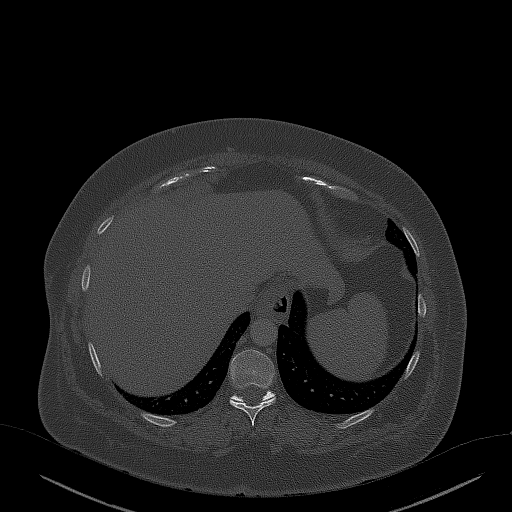

[15 of 36 positions shown; findings below may reference images not displayed]

FINDINGS: CT CHEST FINDINGS

Cardiovascular: Minimal atherosclerosis of the aorta, great vessels
and coronary arteries. No acute vascular findings. The heart size is
normal. There is no pericardial effusion.

Mediastinum/Nodes: There are no enlarged mediastinal, hilar,
axillary or internal mammary lymph nodes. There is a stable small
hiatal hernia. The thyroid gland and trachea demonstrate no
significant findings.

Lungs/Pleura: There is no pleural effusion or pneumothorax. The
patchy ground-glass opacities seen on the most recent study have
partially cleared, consistent with resolving viral pneumonia. There
are mild residual subpleural linear components bilaterally which may
reflect residual inflammation or scarring. No airspace disease or
suspicious pulmonary nodularity.

Musculoskeletal/Chest wall: Stable appearance of bilateral sub
mammary breast implants. No chest wall mass or suspicious osseous
lesions.

CT ABDOMEN AND PELVIS FINDINGS

Hepatobiliary: The liver is normal in density without suspicious
focal abnormality. No evidence of gallstones, gallbladder wall
thickening or biliary dilatation.

Pancreas: Unremarkable. No pancreatic ductal dilatation or
surrounding inflammatory changes.

Spleen: Normal in size without focal abnormality.

Adrenals/Urinary Tract: Both adrenal glands appear normal. The
kidneys appear normal without evidence of urinary tract calculus,
suspicious lesion or hydronephrosis. No bladder abnormalities are
seen.

Stomach/Bowel: The stomach appears unremarkable for its degree of
distension. No evidence of bowel wall thickening, distention or
surrounding inflammatory change. The appendix appears normal. Stable
mild distal colonic diverticulosis.

Vascular/Lymphatic: There are no enlarged abdominal or pelvic lymph
nodes. No significant vascular findings.

Reproductive: The uterus and ovaries appear normal. No adnexal mass.

Other: Postsurgical changes in the anterior abdominal wall related
to breast reconstruction. No ascites or peritoneal nodularity.

Musculoskeletal: No acute or significant osseous findings.
IMPRESSION: 1. Interval partial resolution of previously demonstrated patchy
ground-glass opacities in both lungs, consistent with resolving
viral pneumonia. There are mild residual subpleural linear
components bilaterally which may reflect residual inflammation or
scarring.
2. No evidence of metastatic disease within the chest, abdomen or
pelvis.
3. Aortic Atherosclerosis ([VM]-[VM]).

## 2020-06-07 MED ORDER — IOHEXOL 300 MG/ML  SOLN
100.0000 mL | Freq: Once | INTRAMUSCULAR | Status: AC | PRN
  Administered 2020-06-07: 100 mL via INTRAVENOUS

## 2020-06-07 NOTE — Telephone Encounter (Signed)
Please advise on patient mychart message  Hi Dr Shearon Stalls just wanted you to know I did my Ct scan today and results are up. Thank you.

## 2020-06-12 ENCOUNTER — Other Ambulatory Visit: Payer: Self-pay

## 2020-06-12 ENCOUNTER — Other Ambulatory Visit: Payer: No Typology Code available for payment source | Admitting: Internal Medicine

## 2020-06-12 DIAGNOSIS — E039 Hypothyroidism, unspecified: Secondary | ICD-10-CM

## 2020-06-13 LAB — TSH: TSH: 4.21 mIU/L

## 2020-06-30 ENCOUNTER — Telehealth: Payer: Self-pay | Admitting: Internal Medicine

## 2020-06-30 NOTE — Telephone Encounter (Signed)
LVM to CB, we received a message thru husbands MyChart that Rebecca [name] has broken out in hives, has ulcers and is crying and he was wanting [place] to call her.

## 2020-06-30 NOTE — Telephone Encounter (Signed)
Rebecca [name] called back, she is doing okay, she stated last night she was having some really bad stomach cramps and pain and she thinks she got stressed out with that and it caused the hives to come out. She has taking Benadryl, Singular, Hylandl and lorazepam, the hives are looking much better, she is working and feeling much better. She feels like he was over reacting. She said sometimes you just have to cry and she told him to leave you alone. She would call if she needs [place]. She does not want to take a steroid, wants to keep doing what she is doing, looking forward to a nice peaceful weekend with children, while [place] the race track.

## 2020-07-12 NOTE — Telephone Encounter (Signed)
[name] called office to say she is having hives again wanting to know if she could get prednisone called in or if she could come in for a shot. I let her know Dr Renold Genta was out of office today and ask if she had called the Allergy Doctor that she had been referred to for the hives and she had not, so she was going to call them, or maybe go to an Urgent Care.

## 2020-07-16 ENCOUNTER — Other Ambulatory Visit: Payer: Self-pay | Admitting: Hematology and Oncology

## 2020-07-17 ENCOUNTER — Telehealth: Payer: Self-pay | Admitting: Internal Medicine

## 2020-07-17 NOTE — Telephone Encounter (Signed)
Received fax from [facility] for patients Oxygen usage renewal from [place]. From [employer] Phone [phone] Fax [phone]  We have not seen patient for anything about her oxygen, so I faxed forms onto her pulmonary doctor,   Dr Lenice Llamas Phone [phone] Fax [phone]

## 2020-07-18 ENCOUNTER — Telehealth: Payer: Self-pay | Admitting: Allergy and Immunology

## 2020-07-18 MED ORDER — PREDNISONE 10 MG PO TABS: 10.0000 mg | ORAL_TABLET | Freq: Every day | ORAL | 0 refills | Status: AC

## 2020-07-18 NOTE — Telephone Encounter (Signed)
Please provide Rebecca [name] prednisone 10 mg 1 tablet 1 time per day for 10 days

## 2020-07-18 NOTE — Telephone Encounter (Signed)
Patient states she started breaking out in hives again. She states she has had this issue for at least 2 weeks. She states she has been using benadryl and started taking the montelukast again at night. Nothing seems to be working for her and she states she thinks she needs some prednisone to get them under control and then will make an appointment to see you. Please advice.

## 2020-07-18 NOTE — Telephone Encounter (Signed)
Patient called and has a break out of hives and had them for 2 weeks and been taking benadryl. But would like to have some prednisone. [facility] [address] [place] [phone].

## 2020-07-18 NOTE — Telephone Encounter (Signed)
[facility] [place]. Patient was informed and verbalized understanding. She did make an appointment for April 06.

## 2020-07-20 ENCOUNTER — Other Ambulatory Visit: Payer: Self-pay | Admitting: Cardiology

## 2020-07-20 DIAGNOSIS — I5022 Chronic systolic (congestive) heart failure: Secondary | ICD-10-CM

## 2020-07-21 NOTE — Telephone Encounter (Signed)
Received another copy of oxygen paperwork to fill out tried to call [facility] stayed on phone for 20 minutes with someone helping me, then I got cut off, so I faxed the paperwork back to them explaining that we do not see patient for her oxygen needs and gave them her pulmonary doctors name. While I was on the phone with [place] they let me know that the patient had told them to fax the paperwork to Dr Renold Genta.

## 2020-07-27 ENCOUNTER — Ambulatory Visit: Payer: No Typology Code available for payment source | Admitting: Internal Medicine

## 2020-07-27 ENCOUNTER — Other Ambulatory Visit: Payer: Self-pay

## 2020-07-27 ENCOUNTER — Other Ambulatory Visit: Payer: Self-pay | Admitting: Cardiology

## 2020-07-27 ENCOUNTER — Encounter: Payer: Self-pay | Admitting: Internal Medicine

## 2020-07-27 VITALS — BP 136/84 | HR 71 | Temp 97.4°F | Ht 69.5 in | Wt 256.2 lb

## 2020-07-27 DIAGNOSIS — J9611 Chronic respiratory failure with hypoxia: Secondary | ICD-10-CM

## 2020-07-27 DIAGNOSIS — I5022 Chronic systolic (congestive) heart failure: Secondary | ICD-10-CM

## 2020-07-27 DIAGNOSIS — E119 Type 2 diabetes mellitus without complications: Secondary | ICD-10-CM

## 2020-07-27 DIAGNOSIS — E1169 Type 2 diabetes mellitus with other specified complication: Secondary | ICD-10-CM

## 2020-07-27 DIAGNOSIS — Z6837 Body mass index (BMI) 37.0-37.9, adult: Secondary | ICD-10-CM

## 2020-07-27 DIAGNOSIS — U071 COVID-19: Secondary | ICD-10-CM

## 2020-07-27 DIAGNOSIS — E669 Obesity, unspecified: Secondary | ICD-10-CM

## 2020-07-27 MED ORDER — EMPAGLIFLOZIN 10 MG PO TABS: 10.0000 mg | ORAL_TABLET | Freq: Every day | ORAL | 1 refills | Status: DC

## 2020-07-27 NOTE — Patient Instructions (Addendum)
Keep up with the exercise.  If you have having trouble with your breathing - let me know and we can get you back in to get Pulmonary function testing.

## 2020-07-27 NOTE — Addendum Note (Signed)
Addended by: Kela Millin on: 07/27/2020 01:04 PM   Modules accepted: Orders, Level of Service

## 2020-07-27 NOTE — Progress Notes (Signed)
Rebecca [name]    [id number]    18-Jul-1970  Primary Care Physician:Baxley, Cresenciano Lick, MD Date of Appointment: 07/27/2020 Established Patient Visit  Chief complaint:   Chief Complaint  Patient presents with  . Follow-up    SOB improved     HPI: Rebecca [name] is a 50 y.o. woman with history of covid 19 infection discharged on home oxygen.   Interval Updates: Here for follow up today after CT scan.  She no longer needing or taking any breathing treatments.  Breathing feels much improved. She is staying 96-98% at home on puls oximetery on room air. Has dyspnea on exertion which she attributes to her weight. She is talking about bariatric surgery with her cardiologist, but would like to try increasing her exercise first.   I have reviewed the patient's family social and past medical history and updated as appropriate.   Past Medical History:  Diagnosis Date  . Allergy    constant hives post chemo therapy.  All allergen tests have been neg.  [place] Kitchen Anxiety   . Breast cancer ([place])   . Coronary artery disease   . Diabetes mellitus without complication ([place])   . Diverticulitis   . GERD (gastroesophageal reflux disease)   . Hiatal hernia   . Hyperlipidemia   . Hypertension   . Hypothyroidism   . LBBB (left bundle branch block)   . Pneumonia     Past Surgical History:  Procedure Laterality Date  . BREAST RECONSTRUCTION WITH PLACEMENT OF TISSUE EXPANDER AND ALLODERM Bilateral 11/18/2017   Procedure: BILATERAL BREAST RECONSTRUCTION WITH PLACEMENT OF TISSUE EXPANDER AND ALLODERM;  Surgeon: Irene Limbo, MD;  Location: [facility];  Service: Plastics;  Laterality: Bilateral;  . CESAREAN SECTION  2007/2010   x2  . COLONOSCOPY  2017  . LAPAROSCOPIC TUBAL LIGATION  06/12/2011   Procedure: LAPAROSCOPIC TUBAL LIGATION;  Surgeon: Daria Pastures, MD;  Location: [facility];  Service: Gynecology;  Laterality: N/A;  filshie clip  . LIPOSUCTION WITH  LIPOFILLING Bilateral 02/24/2018   Procedure: LIPOFILLING FROM ABDOMEN TO BILATERAL CHEST;  Surgeon: Irene Limbo, MD;  Location: [facility];  Service: Plastics;  Laterality: Bilateral;  . MASTECTOMY W/ SENTINEL NODE BIOPSY Bilateral 11/18/2017   Procedure: BILATERAL TOTAL MASTECTOMIES WITH RIGHT SENTINEL LYMPH NODE BIOPSY;  Surgeon: Rolm Bookbinder, MD;  Location: [facility];  Service: General;  Laterality: Bilateral;  . mini tuck  2012   abdominal  . PORT-A-CATH REMOVAL Right 01/05/2019  . PORTACATH PLACEMENT Right 07/15/2017   Procedure: INSERTION PORT-A-CATH WITH ULTRASOUND;  Surgeon: Rolm Bookbinder, MD;  Location: [facility];  Service: General;  Laterality: Right;  . REMOVAL OF BILATERAL TISSUE EXPANDERS WITH PLACEMENT OF BILATERAL BREAST IMPLANTS Bilateral 02/24/2018   Procedure: REMOVAL OF BILATERAL TISSUE EXPANDERS WITH PLACEMENT OF BILATERAL BREAST IMPLANTS;  Surgeon: Irene Limbo, MD;  Location: [facility];  Service: Plastics;  Laterality: Bilateral;  . right breast cancer     upper-outer quadrant   . right ear surg  03/2008   Mohs  . TUBAL LIGATION    . US ECHOCARDIOGRAPHY  12-30-2007   EF 55-60%  . WISDOM TOOTH EXTRACTION      Family History  Problem Relation Age of Onset  . Lymphoma Father   . Cancer Father        lymphoma  . Diabetes Brother   . Diabetes Mother   . Liver disease Mother  NASH, d. 40  . Asthma Mother   . CAD Maternal Grandmother   . CAD Maternal Grandfather   . Stomach cancer Neg Hx   . Colon cancer Neg Hx   . Colon polyps Neg Hx   . Esophageal cancer Neg Hx   . Rectal cancer Neg Hx     Social History   Occupational History  . Occupation: [employer]  Tobacco Use  . Smoking status: Former Smoker    Packs/day: 0.25    Years: 6.00    Pack years: 1.50    Types: Cigarettes    Quit date: 03/29/2005    Years since quitting: 15.3  . Smokeless tobacco: Never Used   Vaping Use  . Vaping Use: Never used  Substance and Sexual Activity  . Alcohol use: Yes    Comment: socially  . Drug use: No  . Sexual activity: Yes    Birth control/protection: Surgical    Comment: BTL     Physical Exam: Blood pressure 136/84, pulse 71, temperature (!) 97.4 F (36.3 C), temperature source Temporal, height 5' 9.5" (1.765 m), weight 256 lb 3.2 oz (116.2 kg), SpO2 99 %.  Gen:      No acute distress Lungs:    No increased respiratory effort, symmetric chest wall excursion, clear to auscultation bilaterally, no wheezes or crackles CV:         Regular rate and rhythm; no murmurs, rubs, or gallops.  No pedal edema   Data Reviewed: Imaging: I have personally reviewed the CT Chest in Feb 2022 improved multifocal airspace opacities when compared to CT Chest from 2021.   PFTs: none  Labs:  Immunization status: Immunization History  Administered Date(s) Administered  . Influenza, Quadrivalent, Recombinant, Inj, Pf 01/31/2018  . Influenza,inj,Quad PF,6+ Mos 02/04/2016, 01/23/2019, 02/11/2020  . Influenza,inj,quad, With Preservative 02/10/2017  . PFIZER(Purple Top)SARS-COV-2 Vaccination 03/13/2020, 04/12/2020    Assessment:  COVID 19 infection Chronic hypoxemic respiratory failure  Plan/Recommendations: Performed ambulatory desaturation study. Lowest was 92% with exertion. Will discontinue home oxygen. We discussed obtaining PFTs if she is still having ongoing dyspnea. She will let us know. She will follow up as needed.   Return to Care: Return if symptoms worsen or fail to improve.   Lenice Llamas, MD Pulmonary and [facility]

## 2020-07-27 NOTE — Progress Notes (Addendum)
ICD-10-CM   1. Diabetes mellitus type 2 in obese (HCC)  E11.69 empagliflozin (JARDIANCE) 10 MG TABS tablet   E66.9 Amb Referral to Bariatric Surgery  2. Chronic systolic heart failure (HCC)  I50.22 empagliflozin (JARDIANCE) 10 MG TABS tablet    Amb Referral to Bariatric Surgery  3. Class 2 severe obesity due to excess calories with serious comorbidity and body mass index (BMI) of 37.0 to 37.9 in adult [facility])  E66.01 Amb Referral to Bariatric Surgery   Z68.37    Meds ordered this encounter  Medications  . empagliflozin (JARDIANCE) 10 MG TABS tablet    Sig: Take 1 tablet (10 mg total) by mouth daily before breakfast.    Dispense:  90 tablet    Refill:  1    Medications Discontinued During This Encounter  Medication Reason  . dapagliflozin propanediol (FARXIGA) 5 MG TABS tablet Side effect (s)     Orders Placed This Encounter  Procedures  . Amb Referral to Bariatric Surgery    Referral Priority:   Routine    Referral Type:   Consultation    Referred to Provider:   Johnathan Hausen, MD    Number of Visits Requested:   1    I had discussed over the phone regarding her risk factors for cardiovascular events, patient has tried her best to lose weight, has been unsuccessful.  She could not tolerate Farxiga due to recurrent UTI.  She is willing to try Jardiance.  Samples given and prescription sent.  I will set her up to see me in the office for cardiovascular follow-up.   Adrian Prows, MD, [facility] 07/27/2020, 1:02 PM Office: [phone] Pager: [phone]

## 2020-07-29 ENCOUNTER — Other Ambulatory Visit: Payer: Self-pay | Admitting: Cardiology

## 2020-07-29 DIAGNOSIS — I5022 Chronic systolic (congestive) heart failure: Secondary | ICD-10-CM

## 2020-07-29 DIAGNOSIS — E1169 Type 2 diabetes mellitus with other specified complication: Secondary | ICD-10-CM

## 2020-08-02 ENCOUNTER — Ambulatory Visit: Payer: Self-pay | Admitting: Allergy and Immunology

## 2020-08-15 ENCOUNTER — Other Ambulatory Visit: Payer: Self-pay | Admitting: *Deleted

## 2020-08-15 MED ORDER — PREDNISONE 10 MG PO TABS: 10.0000 mg | ORAL_TABLET | Freq: Every day | ORAL | 0 refills | Status: DC

## 2020-08-15 NOTE — Progress Notes (Signed)
Received call from pt with complaint of whole body hives while on vacation.  Pt denies fever or purulent drainage.  Per MD pt to be prescribed prednisone 20 mg oral tablet daily x5 days and then decrease dose to 10 mg x5 days.  Prescription sent to pharmacy, pt educated and verbalized understanding.

## 2020-08-23 ENCOUNTER — Ambulatory Visit: Payer: Self-pay | Admitting: Allergy and Immunology

## 2020-08-23 DIAGNOSIS — J309 Allergic rhinitis, unspecified: Secondary | ICD-10-CM

## 2020-08-24 NOTE — Telephone Encounter (Signed)
Called [employer] at [phone], once again spent 10 minutes on the phone, to let them know we are not going to be filling out the oxygen forms, that patient sees pulmonary doctor for this. That they need to contact them. I also let them know, that I had already called 5 times and I would not be calling or faxing anymore to try and get this straightened out. She said they would call the patient to get the right information, I let her know the patient is who told them to send paperwork to [place], that so often they do not know who fills out what paperwork. We do not do testing for oxygen in this office that would be her pulmonary doctor.

## 2020-08-24 NOTE — Telephone Encounter (Signed)
LVM to let patient know that if [facility] calls her about oxygen that she should give them her pulmonary doctors name to fill out paperwork, that we do not do that paperwork, that I had try to fix it, but they will not listen to me.

## 2020-08-24 NOTE — Telephone Encounter (Addendum)
Received another copy of oxygen paperwork on 08/09/2020, called [employer], spent 20 minutes on the phone once again explaining to them, we are patients PCP and do not see patient for oxygen that her pulmonary doctor see her for this and they would need to complete this form. She verbalized understanding and said she would forward a e-mail over explaining this.  I also faxed over all of Pulmonary doctors information once again to [phone], phone [phone]  Account [id number]

## 2020-09-04 ENCOUNTER — Other Ambulatory Visit: Payer: Self-pay | Admitting: Internal Medicine

## 2020-09-11 ENCOUNTER — Encounter: Payer: Self-pay | Admitting: Internal Medicine

## 2020-09-11 ENCOUNTER — Telehealth: Payer: Self-pay | Admitting: Internal Medicine

## 2020-09-11 NOTE — Telephone Encounter (Signed)
Continue to receive multiple requests from [facility] about re-ordering home O2. Have reviewed Dr. Mauricio Po notes from March 31st where she recommended discontinuing home oxygen. Have sent a fax to [facility] with records from Dr. Shearon Stalls stating to d/c home oxygen at this time. MJB,MD

## 2020-09-12 ENCOUNTER — Encounter: Payer: Self-pay | Admitting: Cardiology

## 2020-09-12 ENCOUNTER — Ambulatory Visit: Payer: No Typology Code available for payment source | Admitting: Cardiology

## 2020-09-12 ENCOUNTER — Other Ambulatory Visit: Payer: Self-pay

## 2020-09-12 VITALS — BP 126/80 | HR 103 | Temp 98.6°F | Resp 17 | Ht 69.5 in | Wt 254.2 lb

## 2020-09-12 DIAGNOSIS — E119 Type 2 diabetes mellitus without complications: Secondary | ICD-10-CM

## 2020-09-12 DIAGNOSIS — I10 Essential (primary) hypertension: Secondary | ICD-10-CM

## 2020-09-12 DIAGNOSIS — I5022 Chronic systolic (congestive) heart failure: Secondary | ICD-10-CM

## 2020-09-12 DIAGNOSIS — E669 Obesity, unspecified: Secondary | ICD-10-CM

## 2020-09-12 DIAGNOSIS — I427 Cardiomyopathy due to drug and external agent: Secondary | ICD-10-CM

## 2020-09-12 DIAGNOSIS — E782 Mixed hyperlipidemia: Secondary | ICD-10-CM

## 2020-09-12 DIAGNOSIS — I447 Left bundle-branch block, unspecified: Secondary | ICD-10-CM

## 2020-09-12 DIAGNOSIS — E1169 Type 2 diabetes mellitus with other specified complication: Secondary | ICD-10-CM

## 2020-09-12 MED ORDER — LIRAGLUTIDE 18 MG/3ML ~~LOC~~ SOPN: 1.8000 mg | PEN_INJECTOR | Freq: Every day | SUBCUTANEOUS | 2 refills | Status: DC

## 2020-09-12 MED ORDER — VALSARTAN 80 MG PO TABS: 80.0000 mg | ORAL_TABLET | Freq: Every evening | ORAL | 2 refills | Status: DC

## 2020-09-12 NOTE — Progress Notes (Signed)
Primary Physician/Referring:  Elby Showers, MD  Patient ID: Rebecca [name], female    DOB: 1970-12-08, 50 y.o.   MRN: [id number]  Chief Complaint  Patient presents with  . Follow-up    4-6 week  . Cardiomyopathy  . Congestive Heart Failure   HPI:    Rebecca [name]  is a 50 y.o. Caucasian female initially with history of LBBB at least dating back to 2013. At that time she has had a negative nuclear stress test and echocardiogram revealed low normal LVEF at 45-50%.   Past medical history includes hyperthyroidism S/P RAI therapy in March 2018, depression, chronic low back pain, estrogen negative right breast cancer for which he underwent chemotherapy(Adjuvant chemotherapy with dose dense Adriamycin and Cytoxan x4)  and also bilateral mastectomy with reconstruction surgery on 11/18/2017. Surveillance echocardiogram due to chemotherapy attributed gradual decrease of LVEF to moderate to severe LV systolic dysfunction of 30 to 35%. 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     She was admitted on 12/25/2019 with COVID-19 pneumonia and also had a prolonged hospitalization and discharged on 01/04/2020.  She was on outpatient oxygen therapy for a prolonged period.  She is now off of oxygen, feels well, has not noticed any worsening dyspnea, no leg edema, no PND or orthopnea.  Rebecca [name] was discontinued due to severe skin rash.  Past Medical History:  Diagnosis Date  . Allergy    constant hives post chemo therapy.  All allergen tests have been neg.  Marland Kitchen Anxiety   . Breast cancer ([place])   . Coronary artery disease   . Diabetes mellitus without complication ([place])   . Diverticulitis   . GERD (gastroesophageal reflux disease)   . Hiatal hernia   . Hyperlipidemia   . Hypertension   . Hypothyroidism   . LBBB (left bundle branch block)   . Pneumonia    Past Surgical History:  Procedure Laterality Date  . BREAST RECONSTRUCTION WITH PLACEMENT OF TISSUE EXPANDER AND  ALLODERM Bilateral 11/18/2017   Procedure: BILATERAL BREAST RECONSTRUCTION WITH PLACEMENT OF TISSUE EXPANDER AND ALLODERM;  Surgeon: Irene Limbo, MD;  Location: [facility];  Service: Plastics;  Laterality: Bilateral;  . CESAREAN SECTION  2007/2010   x2  . COLONOSCOPY  2017  . LAPAROSCOPIC TUBAL LIGATION  06/12/2011   Procedure: LAPAROSCOPIC TUBAL LIGATION;  Surgeon: Daria Pastures, MD;  Location: [facility];  Service: Gynecology;  Laterality: N/A;  filshie clip  . LIPOSUCTION WITH LIPOFILLING Bilateral 02/24/2018   Procedure: LIPOFILLING FROM ABDOMEN TO BILATERAL CHEST;  Surgeon: Irene Limbo, MD;  Location: [facility];  Service: Plastics;  Laterality: Bilateral;  . MASTECTOMY W/ SENTINEL NODE BIOPSY Bilateral 11/18/2017   Procedure: BILATERAL TOTAL MASTECTOMIES WITH RIGHT SENTINEL LYMPH NODE BIOPSY;  Surgeon: Rolm Bookbinder, MD;  Location: [facility];  Service: General;  Laterality: Bilateral;  . mini tuck  2012   abdominal  . PORT-A-CATH REMOVAL Right 01/05/2019  . PORTACATH PLACEMENT Right 07/15/2017   Procedure: INSERTION PORT-A-CATH WITH ULTRASOUND;  Surgeon: Rolm Bookbinder, MD;  Location: [facility];  Service: General;  Laterality: Right;  . REMOVAL OF BILATERAL TISSUE EXPANDERS WITH PLACEMENT OF BILATERAL BREAST IMPLANTS Bilateral 02/24/2018   Procedure: REMOVAL OF BILATERAL TISSUE EXPANDERS WITH PLACEMENT OF BILATERAL BREAST IMPLANTS;  Surgeon: Irene Limbo, MD;  Location: [facility];  Service: Plastics;  Laterality: Bilateral;  . right breast cancer     upper-outer quadrant   .  right ear surg  03/2008   Mohs  . TUBAL LIGATION    . US ECHOCARDIOGRAPHY  12-30-2007   EF 55-60%  . WISDOM TOOTH EXTRACTION      Social History   Tobacco Use  . Smoking status: Former Smoker    Packs/day: 0.25    Years: 6.00    Pack years: 1.50    Types: Cigarettes    Quit date: 03/29/2005    Years since  quitting: 15.4  . Smokeless tobacco: Never Used  Substance Use Topics  . Alcohol use: Yes    Comment: socially  Marital Status: Married    ROS  Review of Systems  Cardiovascular: Positive for dyspnea on exertion (very mild). Negative for chest pain and leg swelling.  Gastrointestinal: Negative for melena.  Psychiatric/Behavioral: Positive for depression. The patient is nervous/anxious.    Objective   Vitals with BMI 09/12/2020 07/27/2020 03/09/2020  Height 5' 9.5" 5' 9.5" 5\' 10"   Weight 254 lbs 3 oz 256 lbs 3 oz 250 lbs  BMI 37.01 [id number] [id number]  Systolic 123XX123 [id number]  Diastolic 80 84 80  Pulse [id number] 71 80    Blood pressure 126/80, pulse (!) 103, temperature 98.6 F (37 C), temperature source Temporal, resp. rate 17, height 5' 9.5" (1.765 m), weight 254 lb 3.2 oz (115.3 kg), SpO2 93 %. Body mass index is 37 kg/m.  Vitals with BMI 09/12/2020 07/27/2020 03/09/2020  Height 5' 9.5" 5' 9.5" 5\' 10"   Weight 254 lbs 3 oz 256 lbs 3 oz 250 lbs  BMI 37.01 [id number]  Systolic 123XX123 [id number]  Diastolic 80 84 80  Pulse [id number] 71 80     Physical Exam Constitutional:      Comments: Well built and moderately obese in no acute distress  Cardiovascular:     Rate and Rhythm: Normal rate and regular rhythm.     Pulses: Intact distal pulses.     Heart sounds: Normal heart sounds. No murmur heard. No gallop.      Comments: NO JVD. NO leg edema Pulmonary:     Effort: Pulmonary effort is normal.     Breath sounds: Normal breath sounds.  Abdominal:     General: Bowel sounds are normal.     Palpations: Abdomen is soft.  Musculoskeletal:     Cervical back: Neck supple.    Radiology: No results found.  Laboratory examination:   Recent Labs    01/04/20 0450 01/12/20 1130 02/02/20 1156 05/11/20 0816  NA 138 138 139 140  K 5.3* 4.3 4.3 4.6  CL 96* 98 101 99  CO2 32 31 28 25   GLUCOSE 208* 253* 92 98  BUN 29* 13 16 16   CREATININE 0.94 0.82 0.96 0.90  CALCIUM 9.1 8.5* 9.7 9.5  GFRNONAA >60 83   --  75  GFRAA >60 97  --  86   CMP Latest Ref Rng & Units 05/11/2020 02/02/2020 01/12/2020  Glucose 65 - 99 mg/dL 98 92 253(H)  BUN 6 - 24 mg/dL 16 16 13   Creatinine 0.57 - 1.00 mg/dL 0.90 0.96 0.82  Sodium 134 - 144 mmol/L 140 139 138  Potassium 3.5 - 5.2 mmol/L 4.6 4.3 4.3  Chloride 96 - 106 mmol/L 99 101 98  CO2 20 - 29 mmol/L 25 28 31   Calcium 8.7 - 10.2 mg/dL 9.5 9.7 8.5(L)  Total Protein 6.0 - 8.5 g/dL 6.4 - 5.7(L)  Total Bilirubin 0.0 - 1.2 mg/dL 0.3 - 0.4  Alkaline Phos 44 - 121 IU/L  68 - -  AST 0 - 40 IU/L 14 - 13  ALT 0 - 32 IU/L 16 - 25   CBC Latest Ref Rng & Units 05/11/2020 01/12/2020 12/25/2019  WBC 3.4 - 10.8 x10E3/uL 6.0 6.7 4.1  Hemoglobin 11.1 - 15.9 g/dL 13.3 11.4(L) 12.2  Hematocrit 34.0 - 46.6 % 39.8 35.9 38.9  Platelets 150 - 450 x10E3/uL 261 250 195   Lipid Panel Recent Labs    05/11/20 0814  CHOL 151  TRIG 160*  LDLCALC 77  HDL 47     HEMOGLOBIN A1C Lab Results  Component Value Date   HGBA1C 7.0 (H) 05/11/2020   MPG 182.9 12/25/2019   TSH Recent Labs    05/11/20 0815 06/12/20 1126  TSH 0.443* 4.21   Medications   Current Outpatient Medications on File Prior to Visit  Medication Sig Dispense Refill  . Ascorbic Acid (VITAMIN C GUMMIE PO) Take 2 tablets by mouth daily.    . budesonide-formoterol (SYMBICORT) 160-4.5 MCG/ACT inhaler Inhale 2 puffs into the lungs 2 (two) times daily. 1 each 5  . carvedilol (COREG) 12.5 MG tablet Take 1 tablet (12.5 mg total) by mouth 2 (two) times daily. 180 tablet 3  . cetirizine (ZYRTEC) 10 MG tablet Take 10 mg by mouth daily.    . Cholecalciferol (VITAMIN D3 GUMMIES PO) Take 5,000 Units by mouth daily.    . empagliflozin (JARDIANCE) 10 MG TABS tablet Take 1 tablet (10 mg total) by mouth daily before breakfast. 90 tablet 1  . EPINEPHrine 0.3 mg/0.3 mL IJ SOAJ injection Use as directed for life threatening allergic reactions only (Patient taking differently: Inject 0.3 mg into the muscle as needed for anaphylaxis.  Use as directed for life threatening allergic reactions only) 2 each 3  . esomeprazole (NEXIUM) 40 MG capsule TAKE 1 CAPSULE (40 MG TOTAL) BY MOUTH DAILY AT 12 NOON. 90 capsule 2  . furosemide (LASIX) 40 MG tablet TAKE 1 TABLET EVERY DAY AS NEEDED FOR FLUID *SHORTNESS OF BREATH* 30 tablet 1  . ipratropium (ATROVENT HFA) 17 MCG/ACT inhaler Inhale 2 puffs into the lungs every 4 (four) hours. 1 each 1  . levothyroxine (SYNTHROID) 112 MCG tablet Take 112 mcg by mouth daily.    Marland Kitchen LORazepam (ATIVAN) 1 MG tablet One po bid prn 60 tablet 2  . montelukast (SINGULAIR) 10 MG tablet TAKE 1 TABLET BY MOUTH EVERYDAY AT BEDTIME (Patient taking differently: Take 10 mg by mouth at bedtime.) 90 tablet 2  . Multiple Vitamin (MULTIVITAMIN PO) Take 1 tablet by mouth daily. Multivitamin + Omega-3    . NOVOLIN R 100 UNIT/ML injection     . rosuvastatin (CRESTOR) 20 MG tablet Take 1 tablet (20 mg total) by mouth daily. 90 tablet 3  . TRESIBA FLEXTOUCH 100 UNIT/ML FlexTouch Pen Inject 10 Units into the skin daily.    Marland Kitchen venlafaxine XR (EFFEXOR-XR) 150 MG 24 hr capsule TAKE 1 CAPSULE BY MOUTH DAILY WITH BREAKFAST. 90 capsule 0  . guaiFENesin (MUCINEX) 600 MG 12 hr tablet Take 1 tablet (600 mg total) by mouth 2 (two) times daily. (Patient taking differently: Take 600 mg by mouth 2 (two) times daily as needed.) 60 tablet 2   No current facility-administered medications on file prior to visit.    Cardiac Studies:   Coronary CTA 01/01/2019: 1. Coronary artery calcium score 50 Agatston units. This places the patient in the 97th percentile for age and gender, suggesting high risk for future cardiac events. 2.  Nonobstructive coronary disease. 3.  The RCA originates from the left cusp in close proximity to the left coronary and courses interarterially between the aortic root and proximal PA to reach typical RCA territory. Would consider treadmill stress testing to assess for provocative ischemia. 4. LAD system: Mixed plaque in  the proximal LAD with mild (<50%) stenosis. Circumflex system: Large PLOM, small AV LCx after take-off of PLOM. No plaque or stenosis.  Echocardiogram 07/06/2019:  Moderately depressed LV systolic function with visual EF 30-35%. Left  ventricle cavity is dilated. Left ventricle regional wall motion findings:  Basal anteroseptal, Basal anterior, Basal inferoseptal, Mid anteroseptal,  Mid anterior, Mid inferoseptal, Apical anterior, Apical septal and Apical  cap hypokinesis. Calculated EF 43%.  Mild (Grade I) aortic regurgitation.  Mild (Grade I) mitral regurgitation.  No other significant valvular abnormalities.  Consider limited echo with Definity or MUGA scan to re-evaluate left  ventricular function, if clinically indicated.   No significant change compared to prior study on 03/2019.   Echocardiogram 12/29/2019:  1. Left ventricular ejection fraction, by estimation, is 25 to 30%. The left ventricle has severely decreased function. The left ventricle demonstrates global hypokinesis. Left ventricular diastolic function could not be evaluated. 2. Right ventricular systolic function is normal. The right ventricular size is normal. 3. Left atrial size was mildly dilated. 4. The mitral valve is normal in structure. No evidence of mitral valve regurgitation. No evidence of mitral stenosis. 5. The aortic valve is normal in structure. Aortic valve regurgitation is not visualized. No aortic stenosis is present. 6. The inferior vena cava is normal in size with greater than 50% respiratory variability, suggesting right atrial pressure of 3 mmHg.  Comparison(s): Prior images unable to be directly viewed, comparison made by report only. Compared to 09/29/2019, EF has decreased from 40%. Findings consistent with cardiomyopathy.  EKG: EKG 07/14/2019: Normal sinus rhythm at rate of 71 bpm, normal axis, left bundle branch block.  No further analysis.  QRS duration 150 ms.  Compared to 01/13/2019, no  significant change, heart rate has reduced from 83 bpm.  Assessment     ICD-10-CM   1. Chronic systolic heart failure (HCC)  I50.22 PCV ECHOCARDIOGRAM COMPLETE    valsartan (DIOVAN) 80 MG tablet  2. 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 LBBB (left bundle branch block), chronic  I44.7   4. Essential hypertension  I10 EKG 12-Lead  5. Diabetes mellitus type 2 in obese (HCC)  E11.69 liraglutide (VICTOZA) 18 MG/3ML SOPN   E66.9 Hgb A1c w/o eAG  6. Type 2 diabetes mellitus without complication, without long-term current use of insulin (HCC)  E11.9 liraglutide (VICTOZA) 18 MG/3ML SOPN  7. Mixed hyperlipidemia  E78.2 Lipid Panel With LDL/HDL Ratio    Meds ordered this encounter  Medications  . liraglutide (VICTOZA) 18 MG/3ML SOPN    Sig: Inject 1.8 mg into the skin daily.    Dispense:  18 mL    Refill:  2  . valsartan (DIOVAN) 80 MG tablet    Sig: Take 1 tablet (80 mg total) by mouth every evening.    Dispense:  30 tablet    Refill:  2    Medications Discontinued During This Encounter  Medication Reason  . clotrimazole-betamethasone (LOTRISONE) cream Error  . omalizumab Arvid Right) injection 300 mg Error  . predniSONE (DELTASONE) 10 MG tablet Completed Course  . sacubitril-valsartan (ENTRESTO) 97-103 MG Side effect (s)  . sacubitril-valsartan (ENTRESTO) 97-103 MG Error  . sacubitril-valsartan (ENTRESTO) 97-103 MG Side effect (s)  .  predniSONE (DELTASONE) 10 MG tablet Completed Course  . LORazepam (ATIVAN) injection 1 mg     Recommendations:   Rebecca [name]  is a 50 y.o. Caucasian female initially with history of LBBB at least dating back to 2013. At that time she has had a negative nuclear stress test and echocardiogram revealed low normal LVEF at 45-50%.   Past medical history includes hyperthyroidism S/P RAI therapy in March 2018, depression, chronic low back pain, estrogen negative right breast cancer for which he underwent  chemotherapy(Adjuvant chemotherapy with dose dense Adriamycin and Cytoxan x4)  and also bilateral mastectomy with reconstruction surgery on 11/18/2017. Surveillance echocardiogram due to chemotherapy attributed gradual decrease of LVEF to moderate to severe LV systolic dysfunction of 30 to 35%. 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     She was admitted on 12/25/2019 with COVID-19 pneumonia and also had a prolonged hospitalization and discharged on 01/04/2020.  She had very prolonged recovery time and was on home oxygen for a period of time.  Presently doing well, dyspnea is very minimal, she has resumed most of her activities.  Obesity continues to be an issue. From cardiac standpoint there is no acute decompensated heart failure.  Rebecca [name] has been discontinued due to severe rash.  I will start her on valsartan 80 mg daily.  We will check CMP, lipids and also A1c.  In view of moderate obesity, in presence of diabetes would be considered morbidly obese.  Diabetes is still not well controlled as per patient sugars have been high and she has also gained weight since last office visit.  I will start her on Victoza 1.8 mg subcu daily and will inform Dr. Tommie Ard Baxley regarding this, hopefully this will also help with weight loss.  She is not using any furosemide, she is presently on Jardiance which she is tolerating.  I would like to see her back in 3 months for follow-up.  I will repeat echocardiogram.  Thi is a 40 minute encounter.    Rebecca Prows, MD, [facility] 09/12/2020, 9:53 PM Office: [phone]

## 2020-09-14 LAB — LIPID PANEL WITH LDL/HDL RATIO
Cholesterol, Total: 186 mg/dL (ref 100–199)
HDL: 41 mg/dL (ref >39)
LDL Chol Calc (NIH): 75 mg/dL (ref 0–99)
LDL/HDL Ratio: 1.8 ratio (ref 0.0–3.2)
Triglycerides: 440 mg/dL — ABNORMAL HIGH (ref 0–149)
VLDL Cholesterol Cal: 70 mg/dL — ABNORMAL HIGH (ref 5–40)

## 2020-09-14 LAB — HGB A1C W/O EAG: Hgb A1c MFr Bld: 7.3 % — ABNORMAL HIGH (ref 4.8–5.6)

## 2020-09-14 NOTE — Progress Notes (Signed)
Diabetes remains uncontrolled at 7.3 A1c.  Triglycerides are markedly increased due to uncontrolled diabetes.  Hopefully Victoza will help with diabetes control.  I have discussed with Dr. Renold Genta, please make an appointment to see her.

## 2020-09-14 NOTE — Telephone Encounter (Signed)
Have made numerous phone calls and placed numerous faxes to this company concerning Oxygen, they refuse to listen. Have faxed them Dr Shearon Stalls notes several times along with their information. Do not know what else to do. Patient has sent oxygen back.

## 2020-09-18 ENCOUNTER — Other Ambulatory Visit: Payer: Self-pay | Admitting: Cardiology

## 2020-09-18 ENCOUNTER — Telehealth: Payer: Self-pay | Admitting: Cardiology

## 2020-09-18 ENCOUNTER — Other Ambulatory Visit: Payer: Self-pay | Admitting: Internal Medicine

## 2020-09-18 DIAGNOSIS — E119 Type 2 diabetes mellitus without complications: Secondary | ICD-10-CM

## 2020-09-18 DIAGNOSIS — E669 Obesity, unspecified: Secondary | ICD-10-CM

## 2020-09-18 DIAGNOSIS — E1169 Type 2 diabetes mellitus with other specified complication: Secondary | ICD-10-CM

## 2020-09-18 MED ORDER — LIRAGLUTIDE 18 MG/3ML ~~LOC~~ SOPN: 1.8000 mg | PEN_INJECTOR | Freq: Every day | SUBCUTANEOUS | 2 refills | Status: DC

## 2020-09-18 NOTE — Telephone Encounter (Signed)
Please call and see if she is still using insulin intermittently

## 2020-09-18 NOTE — Telephone Encounter (Signed)
ICD-10-CM   1. Diabetes mellitus type 2 in obese (HCC)  E11.69 liraglutide (VICTOZA) 18 MG/3ML SOPN   E66.9   2. Type 2 diabetes mellitus without complication, without long-term current use of insulin (HCC)  E11.9 liraglutide (VICTOZA) 18 MG/3ML SOPN   Meds ordered this encounter  Medications  . liraglutide (VICTOZA) 18 MG/3ML SOPN    Sig: Inject 1.8 mg into the skin daily.    Dispense:  18 mL    Refill:  2    3 month supply    Medications Discontinued During This Encounter  Medication Reason  . liraglutide (VICTOZA) 18 MG/3ML SOPN   Refill   Adrian Prows, MD, [facility] 09/18/2020, 8:26 PM Office: [phone] Fax: [phone] Pager: [phone]

## 2020-09-19 NOTE — Telephone Encounter (Signed)
She said she is using it intermittently.

## 2020-09-20 ENCOUNTER — Ambulatory Visit: Payer: No Typology Code available for payment source

## 2020-09-20 ENCOUNTER — Other Ambulatory Visit: Payer: Self-pay

## 2020-09-20 DIAGNOSIS — I427 Cardiomyopathy due to drug and external agent: Secondary | ICD-10-CM

## 2020-09-20 DIAGNOSIS — I5022 Chronic systolic (congestive) heart failure: Secondary | ICD-10-CM

## 2020-09-23 NOTE — Progress Notes (Signed)
Echocardiogram 09/20/2020: Left ventricle cavity is normal in size and wall thickness. Abnormal septal wall motion due to left bundle branch block. Mildly depressed LV systolic function with EF 47%. Doppler evidence of grade I (impaired) diastolic dysfunction, normal LAP.  Mild to moderate mitral regurgitation. Mild tricuspid regurgitation.  No evidence of pulmonary hypertension. Marginal improvement in LVEF Compared to previous study on 04/18/2020, that noted EF as 42%.

## 2020-10-03 ENCOUNTER — Other Ambulatory Visit: Payer: Self-pay | Admitting: Cardiology

## 2020-10-03 DIAGNOSIS — I5022 Chronic systolic (congestive) heart failure: Secondary | ICD-10-CM

## 2020-10-11 ENCOUNTER — Other Ambulatory Visit: Payer: Self-pay

## 2020-10-11 DIAGNOSIS — K824 Cholesterolosis of gallbladder: Secondary | ICD-10-CM

## 2020-10-17 ENCOUNTER — Other Ambulatory Visit: Payer: Self-pay

## 2020-10-17 ENCOUNTER — Encounter: Payer: Self-pay | Admitting: Internal Medicine

## 2020-10-17 ENCOUNTER — Ambulatory Visit: Payer: No Typology Code available for payment source | Admitting: Internal Medicine

## 2020-10-17 VITALS — BP 100/60 | HR 98 | Temp 98.8°F | Ht 70.0 in | Wt 242.0 lb

## 2020-10-17 DIAGNOSIS — E669 Obesity, unspecified: Secondary | ICD-10-CM | POA: Diagnosis not present

## 2020-10-17 DIAGNOSIS — E1169 Type 2 diabetes mellitus with other specified complication: Secondary | ICD-10-CM | POA: Diagnosis not present

## 2020-10-17 MED ORDER — SAXENDA 18 MG/3ML ~~LOC~~ SOPN: PEN_INJECTOR | SUBCUTANEOUS | 3 refills | Status: DC

## 2020-10-17 NOTE — Patient Instructions (Addendum)
Continue Liraglutide 1.8 mg injected subcutaneously daily.  Will need HGB AIC in July if Liraglutide approved. Plan to follow up then.  It was a pleasure to see you today.  Addendum: additional Info including this note faxed to [facility] October 18, 2020 as requested for approval of Liraglutide.MJB,MD

## 2020-10-17 NOTE — Progress Notes (Addendum)
   Subjective:    Patient ID: Rebecca [name], female    DOB: 09-22-1970, 50 y.o.   MRN: [id number]  HPI Patient here to discuss glucose management. Dr. Nadyne Coombes gave her a sample of Liraglutide for diabetes and she liked it very much was taking 1.8 mg  by injection daily. Is now out of this medication. Dr. Nadyne Coombes called and asked that I prescribe this for patient. He thought it would help her lose weight in the setting of Type 2 Diabetes mellitus.Hgb AIC in May 18 was 7.3%  She also has history of breast cancer in remission and chronic systolic heart failure.    Review of Systems She had a coronary calcium score in January 16, 2049 Agatston units placing her in the 61 percentile for age and gender.  This was concerning to cardiologist.  She had an echocardiogram in March 2021 showing moderately depressed LV function with ejection fraction 30 to 35%.  She has mild grade 1 aortic regurgitation.  She has a history of left bundle branch block dating back to at least 2013.  Had negative nuclear stress test.  Left ventricular ejection fraction 45 to 50% in 2013.  More recent echocardiogram showed severe left ventricular systolic dysfunction of 30 to 35% in 2019.  Delene Loll has been discontinued due to severe rash.  She was started on valsartan 80 mg daily by cardiologist.  She was started by cardiologist on Victoza 1.8 mg subcu daily on May 17th,2022.  History of radioactive iodine therapy in 2018 for hyperthyroidism.  History of breast cancer.     Objective:   Physical Exam Blood pressure 100/60 pulse 98 temperature 98.8 degrees pulse oximetry 97% weight 242 pounds BMI 34.72  Skin warm and dry.  Affect thought and judgment are normal.     Assessment & Plan:   Type 2 diabetes mellitus- last Hgb AIC before starting Liraglutide was 7.3%  BMI 34.72  History of breast cancer  History of COVID-19  Decreased left ventricular ejection fraction 34% followed by Cardiology  Plan: Prescribed  Victoza 1.8 mg subcu daily.  Follow-up in August.  She feels well and looks great.  Addendum: Patient was started was started on Liraglutide by her cardiologist, Dr. Einar Gip on Sep 12, 2020.  At that time she weighed 254 pounds 3.2 ounces (115.3 kg) her BMI at that time was 37.    Her BMI now is 34.72 and she weighs 242 pounds.  This is been slightly over 1 month since she started this medication.  This information will be provided to her insurance company for consideration of payment of Liraglutide.  Hgb AIC will be checked in about 4 weeks if Liraglutide approved.

## 2020-10-18 ENCOUNTER — Other Ambulatory Visit: Payer: Self-pay | Admitting: Hematology and Oncology

## 2020-10-20 ENCOUNTER — Telehealth: Payer: Self-pay | Admitting: Hematology and Oncology

## 2020-10-20 NOTE — Telephone Encounter (Signed)
Scheduled appts per 6/23 sch msg. Called pt, no answer. Left msg with appt date and time.

## 2020-10-24 ENCOUNTER — Ambulatory Visit (HOSPITAL_COMMUNITY)
Admission: RE | Admit: 2020-10-24 | Discharge: 2020-10-24 | Disposition: A | Discharge status: Home / Self Care | Payer: No Typology Code available for payment source | Source: Ambulatory Visit | Attending: Internal Medicine | Admitting: Internal Medicine

## 2020-10-24 ENCOUNTER — Other Ambulatory Visit: Payer: Self-pay

## 2020-10-24 DIAGNOSIS — K824 Cholesterolosis of gallbladder: Secondary | ICD-10-CM | POA: Diagnosis present

## 2020-10-24 IMAGING — US US ABDOMEN COMPLETE
1 series · 13 of 25 positions shown · non-contrast
Comparison: [DATE]

CLINICAL DATA: Follow-up gallbladder polyp

EXAM:
ABDOMEN ULTRASOUND COMPLETE

[Series 1: us abdomen complete · 13 of 115 slices shown]
[im 1/115]
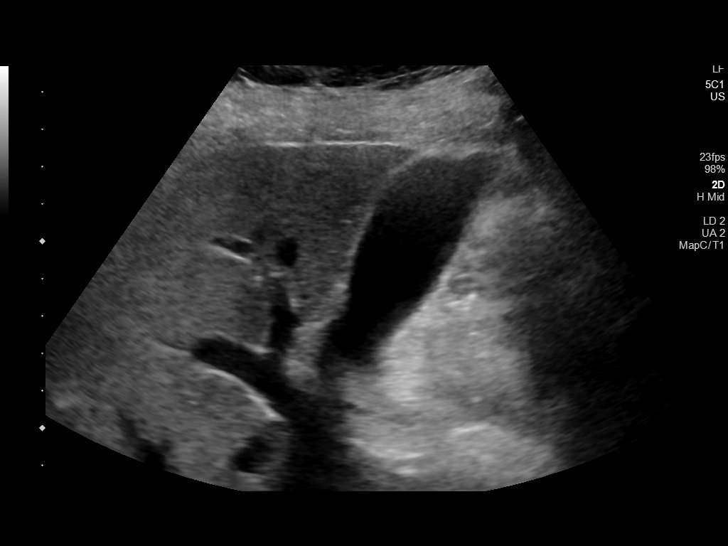
[im 10/115]
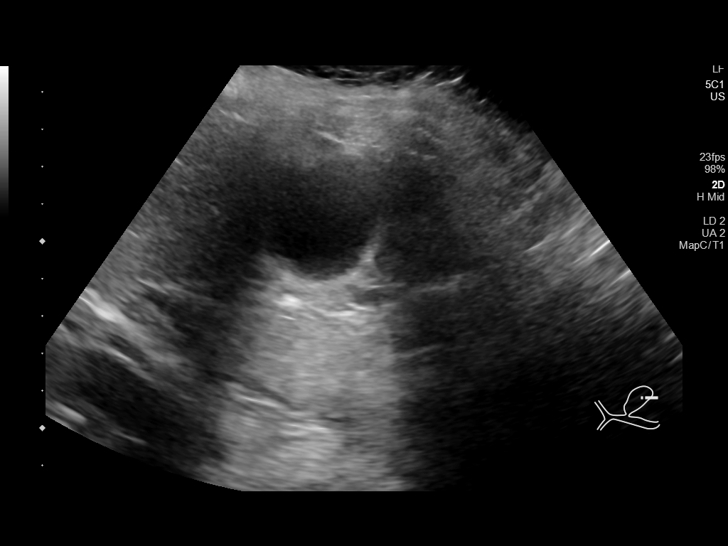
[im 20/115]
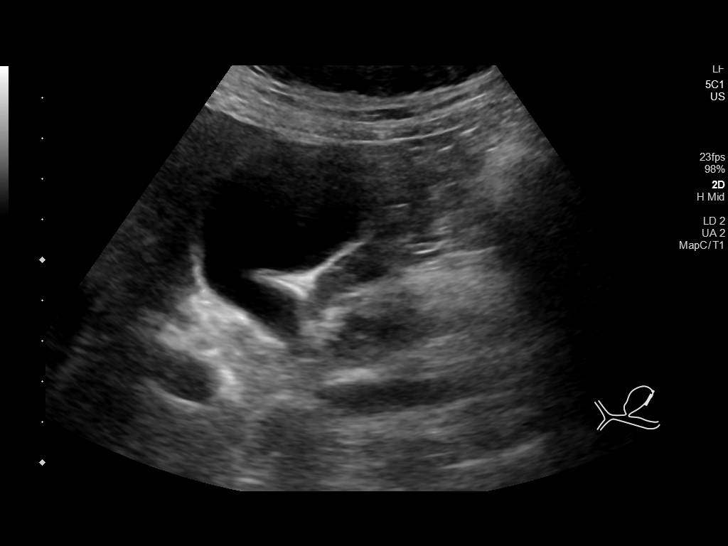
[im 29/115]
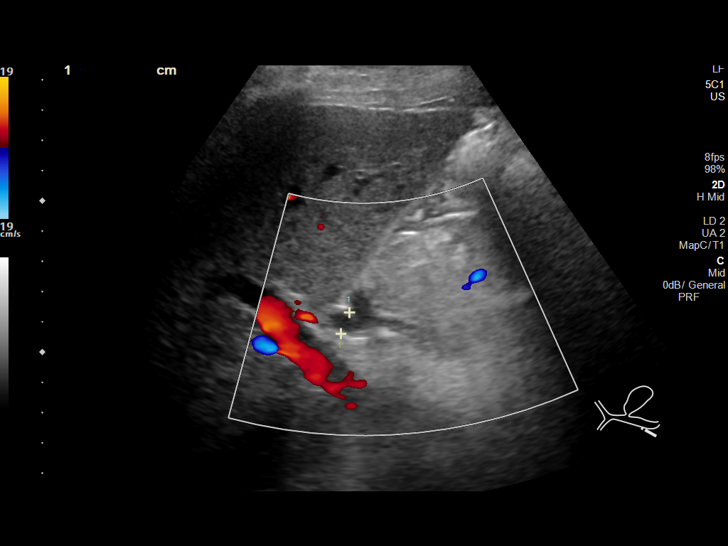
[im 39/115]
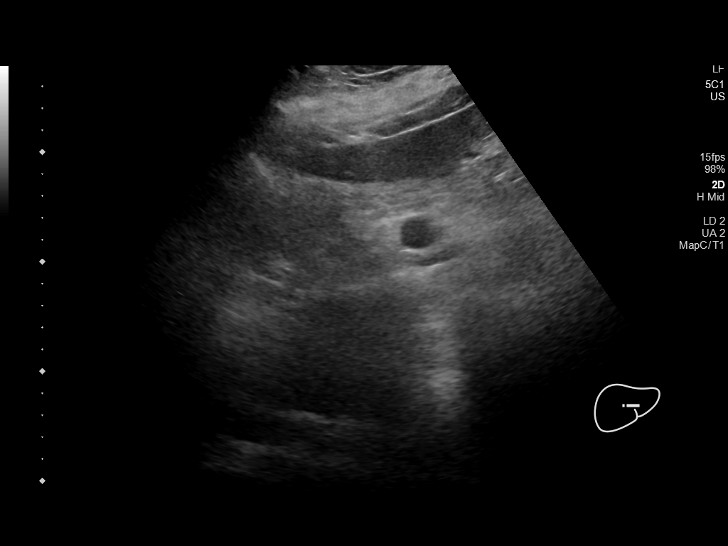
[im 48/115]
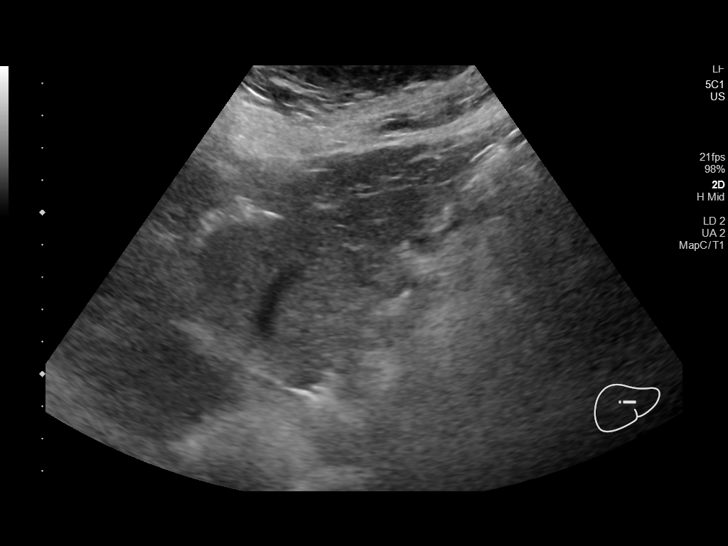
[im 58/115]
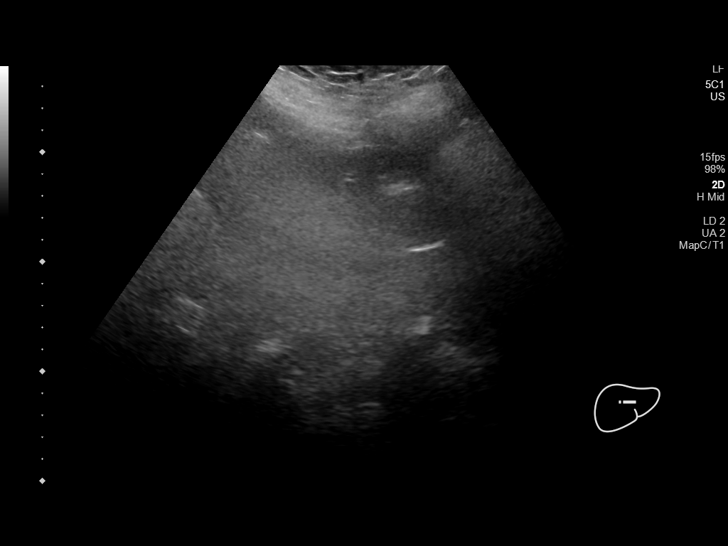
[im 67/115]
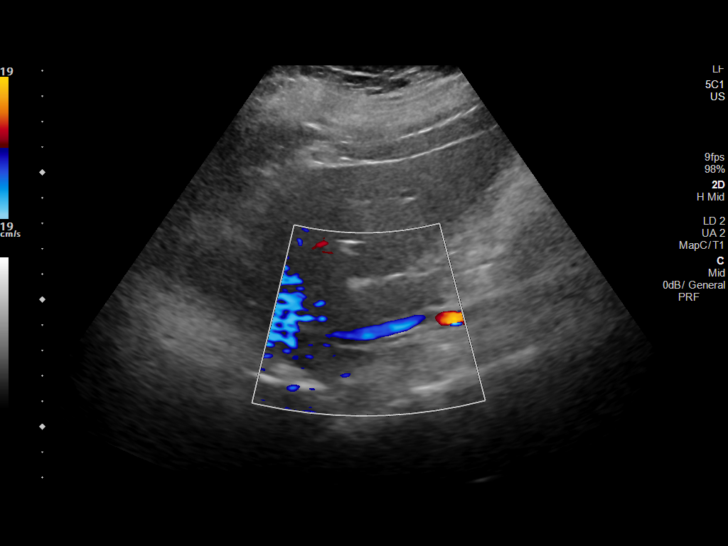
[im 77/115]
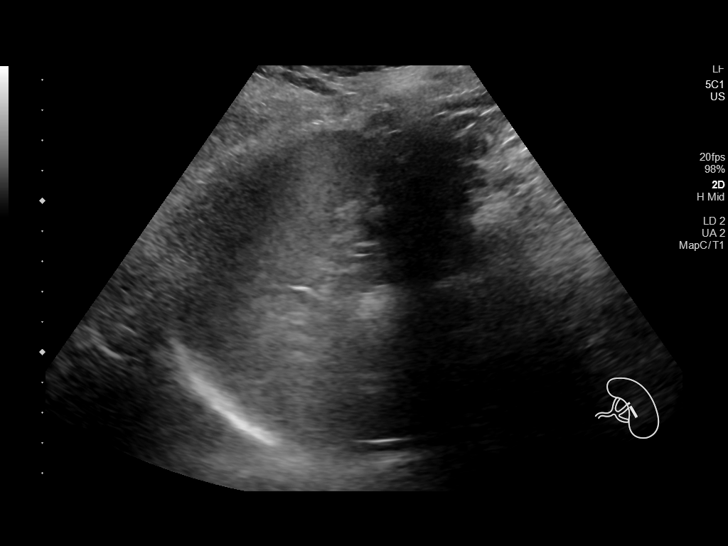
[im 86/115]
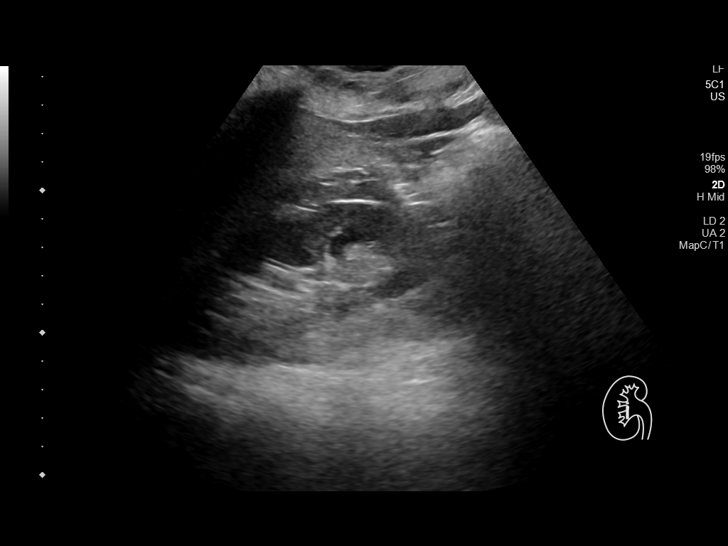
[im 96/115]
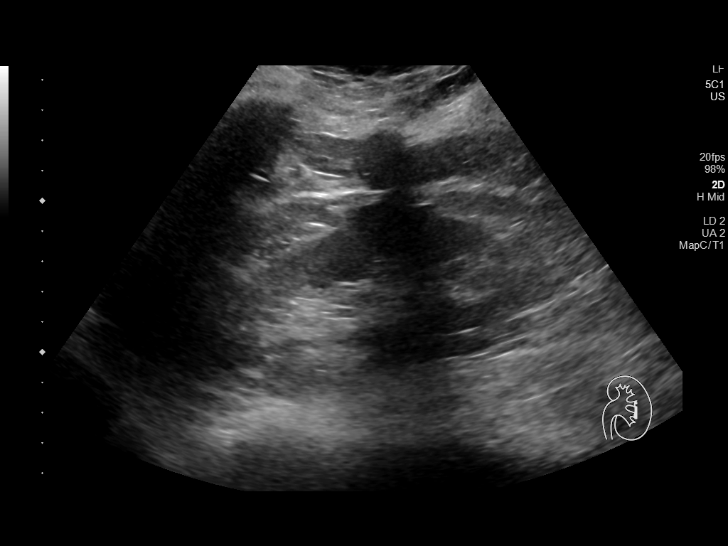
[im 105/115]
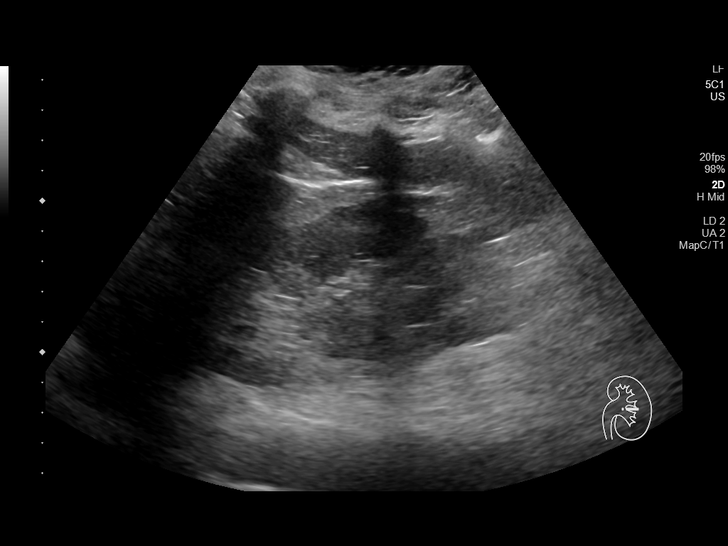
[im 115/115]
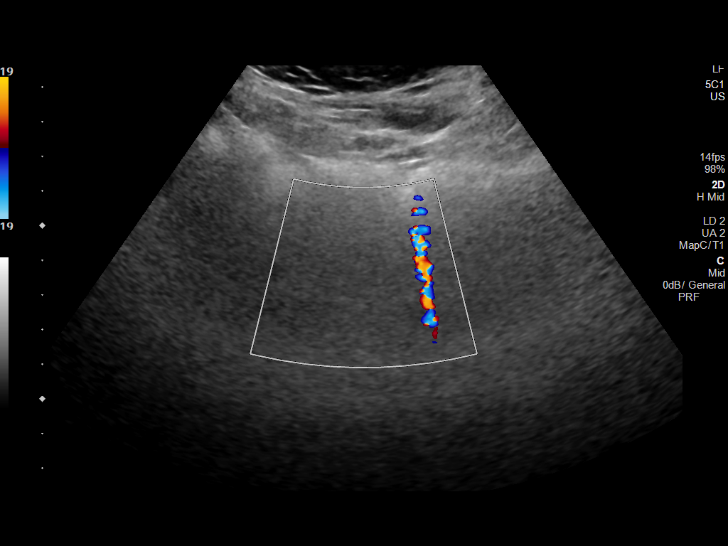

[13 of 25 positions shown; findings below may reference images not displayed]

FINDINGS: Gallbladder: 5 mm gallbladder polyp. No gallstones or wall
thickening visualized. No sonographic Murphy sign noted by
sonographer.

Common bile duct: Diameter: 8 mm

Liver: No focal lesion identified. Diffusely increased parenchymal
echogenicity. Portal vein is patent on color Doppler imaging with
normal direction of blood flow towards the liver.

IVC: No abnormality visualized.

Pancreas: Visualized portion unremarkable.

Spleen: Size and appearance within normal limits.

Right Kidney: Length: 11.7 cm. Echogenicity within normal limits. No
mass or hydronephrosis visualized.

Left Kidney: Length: 11.5 cm. Echogenicity within normal limits. No
mass or hydronephrosis visualized.

Abdominal aorta: No aneurysm visualized.

Other findings: None.
IMPRESSION: 1. The common bile duct is mildly dilated measuring 8 mm. Recommend
correlation with liver function tests to assess for biliary
obstruction and if clinical concern for biliary obstruction MRCP
with and without contrast could be utilized to assess for an
etiology of obstruction.
2. Diffusely increased parenchymal echogenicity, nonspecific but
suggestive of hepatic steatosis.
3. Stable gallbladder polyp measuring 5 mm. No further evaluation or
follow-up necessary based on size criteria. Per consensus
guidelines, this requires no additional evaluation or specific
follow-up. This recommendation follows ACR consensus guidelines:
White Paper of the ACR Incidental findings Committee II on
Gallbladder and Biliary Findings. [HOSPITAL] [PS]:;[DATE].

## 2020-10-25 ENCOUNTER — Other Ambulatory Visit: Payer: Self-pay | Admitting: Internal Medicine

## 2020-10-25 DIAGNOSIS — K838 Other specified diseases of biliary tract: Secondary | ICD-10-CM

## 2020-10-26 ENCOUNTER — Other Ambulatory Visit: Payer: Self-pay

## 2020-10-26 DIAGNOSIS — K838 Other specified diseases of biliary tract: Secondary | ICD-10-CM

## 2020-10-27 ENCOUNTER — Other Ambulatory Visit (INDEPENDENT_AMBULATORY_CARE_PROVIDER_SITE_OTHER): Payer: No Typology Code available for payment source

## 2020-10-27 DIAGNOSIS — K838 Other specified diseases of biliary tract: Secondary | ICD-10-CM | POA: Diagnosis not present

## 2020-10-27 LAB — HEPATIC FUNCTION PANEL
ALT: 24 U/L (ref 0–35)
AST: 22 U/L (ref 0–37)
Albumin: 4.3 g/dL (ref 3.5–5.2)
Alkaline Phosphatase: 64 U/L (ref 39–117)
Bilirubin, Direct: 0.1 mg/dL (ref 0.0–0.3)
Total Bilirubin: 0.3 mg/dL (ref 0.2–1.2)
Total Protein: 6.7 g/dL (ref 6.0–8.3)

## 2020-11-02 ENCOUNTER — Other Ambulatory Visit: Payer: Self-pay | Admitting: Internal Medicine

## 2020-11-02 DIAGNOSIS — K838 Other specified diseases of biliary tract: Secondary | ICD-10-CM

## 2020-11-03 ENCOUNTER — Ambulatory Visit (HOSPITAL_COMMUNITY): Admission: RE | Admit: 2020-11-03 | Payer: No Typology Code available for payment source | Source: Ambulatory Visit

## 2020-11-04 ENCOUNTER — Other Ambulatory Visit: Payer: Self-pay

## 2020-11-04 ENCOUNTER — Ambulatory Visit (HOSPITAL_COMMUNITY)
Admission: RE | Admit: 2020-11-04 | Discharge: 2020-11-04 | Disposition: A | Discharge status: Home / Self Care | Payer: No Typology Code available for payment source | Source: Ambulatory Visit | Attending: Internal Medicine | Admitting: Internal Medicine

## 2020-11-04 DIAGNOSIS — K838 Other specified diseases of biliary tract: Secondary | ICD-10-CM | POA: Insufficient documentation

## 2020-11-04 IMAGING — MR MR ABDOMEN WO/W CM MRCP
21 of 22 series · 46 of 48 positions shown · IV contrast (gadavist)
Comparison: [DATE] abdominal ultrasound.  CT [DATE].

CLINICAL DATA: Biliary duct dilatation on prior ultrasound. Breast
cancer 3 years ago.

EXAM:
MRI ABDOMEN WITHOUT AND WITH CONTRAST (INCLUDING MRCP)
TECHNIQUE: Multiplanar multisequence MR imaging of the abdomen was performed
both before and after the administration of intravenous contrast.
Heavily T2-weighted images of the biliary and pancreatic ducts were
obtained, and three-dimensional MRCP images were rendered by post
processing.
CONTRAST:  10mL GADAVIST GADOBUTROL 1 MMOL/ML IV SOLN

[Series 3: T2 fat-sat · 1 of 3 slices shown (1 of 2)]
[im 1/3]
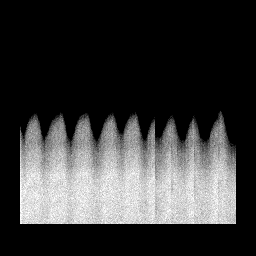

[Series 4: T2 fat-sat · axial · 6.0mm · 1.41mm/px · 1 of 40 slices shown (2 of 2)]
[im 1/40]
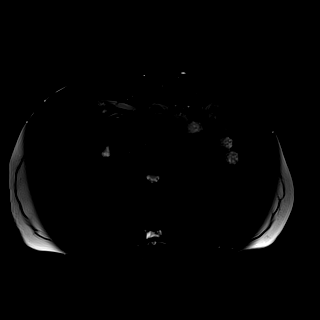

[Series 5: T1 · axial · 3.0mm · 1.41mm/px · z∈[-78,+183]mm · 3 of 88 slices shown (1 of 2)]
[im 1/88]
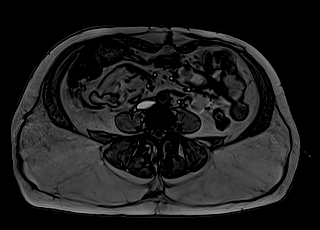
[im 44/88]
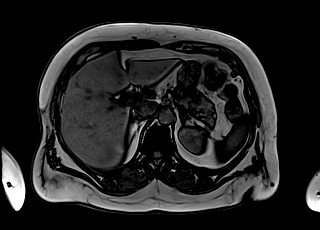
[im 88/88]
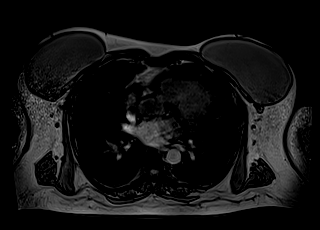

[Series 6: T1 · axial · 3.0mm · 1.41mm/px · z∈[-78,+183]mm · 3 of 88 slices shown (2 of 2)]
[im 1/88]
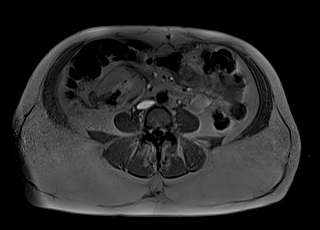
[im 44/88]
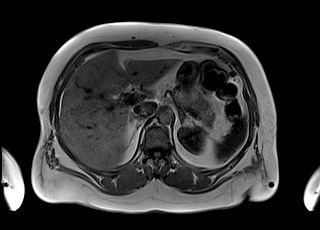
[im 88/88]
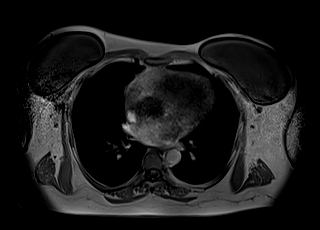

[Series 8: T2 · coronal · 6.0mm · 1.56mm/px · 1 of 40 slices shown (1 of 2)]
[im 1/40]
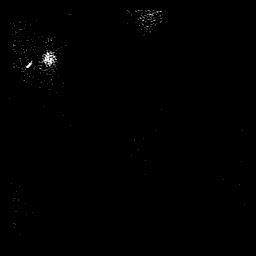

[Series 9: DWI · axial · 6.0mm · 1.68mm/px · z∈[-103,+149]mm · 2 of 72 slices shown (1 of 2)]
[im 1/72]
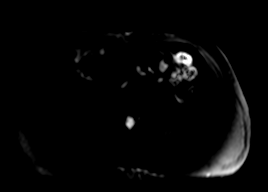
[im 72/72]
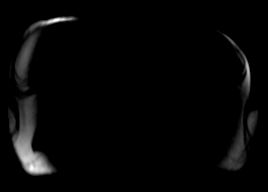

[Series 10: DWI · axial · 6.0mm · 1.68mm/px · 1 of 36 slices shown (2 of 2)]
[im 1/36]
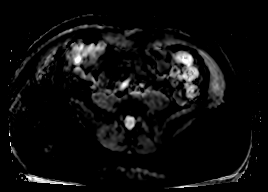

[Series 11: cor obl thk · sagittal · 50.0mm · 0.78mm/px · 1 of 9 slices shown]
[im 1/9]
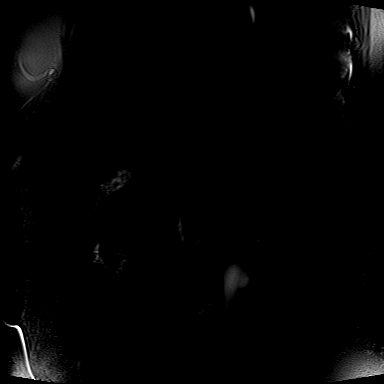

[Series 12: cor_3d_spc_trig-resp · 1 of 7 slices shown]
[im 1/7]
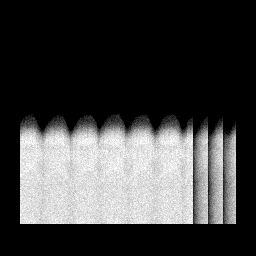

[Series 13: cor_3d_spc_trig · coronal · 1.0mm · 0.49mm/px · 2 of 72 slices shown]
[im 1/72]
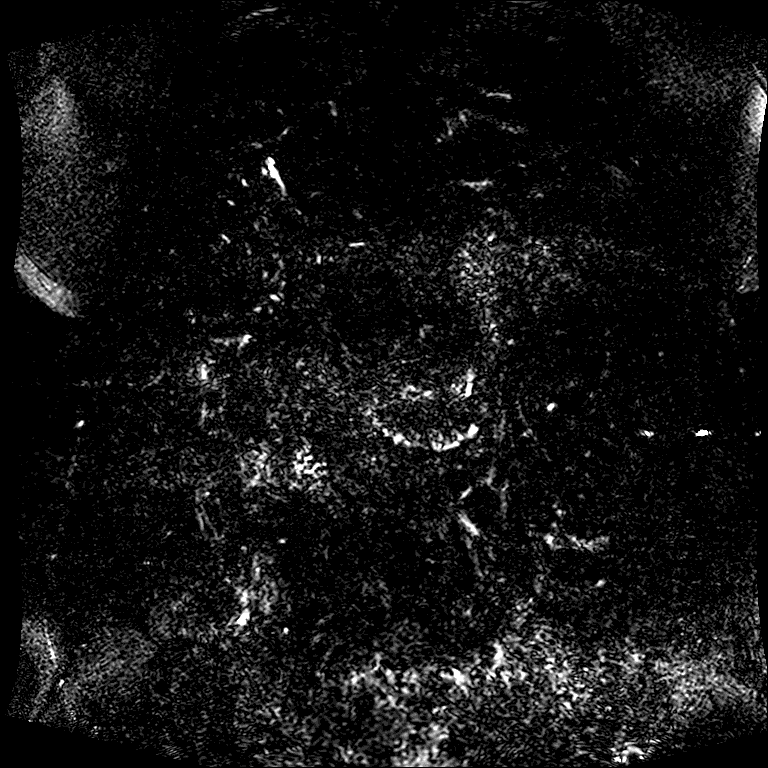
[im 72/72]
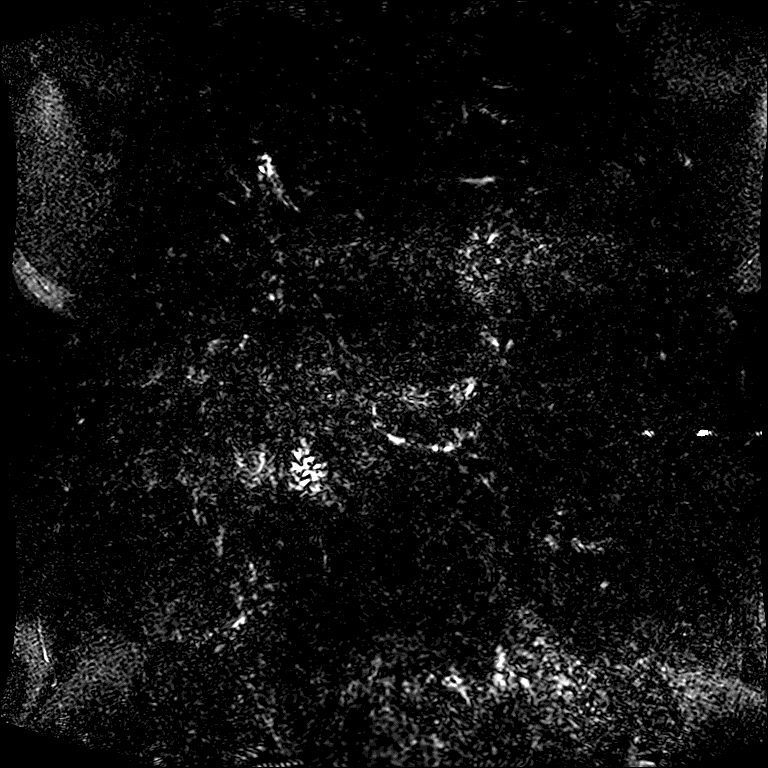

[Series 15: T2 · axial · 6.0mm · 1.76mm/px · 1 of 37 slices shown (2 of 2)]
[im 1/37]
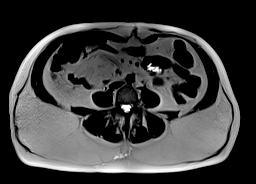

[Series 17: T1 dynamic · axial · 3.0mm · 1.41mm/px · z∈[-62,+199]mm · 3 of 88 slices shown (1 of 10)]
[im 1/88]
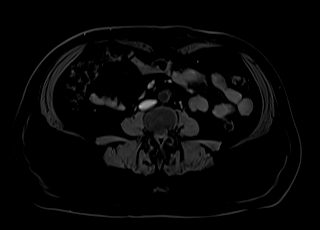
[im 44/88]
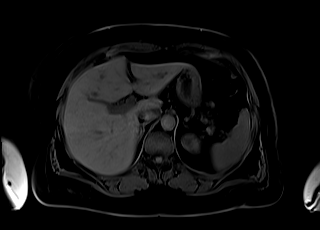
[im 88/88]
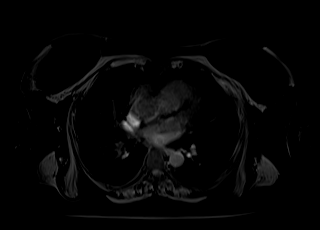

[Series 21: T1 dynamic · axial · 3.0mm · 1.41mm/px · z∈[-62,+199]mm · 3 of 88 slices shown (2 of 10)]
[im 1/88]
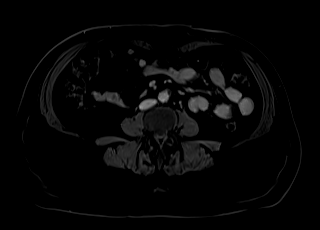
[im 44/88]
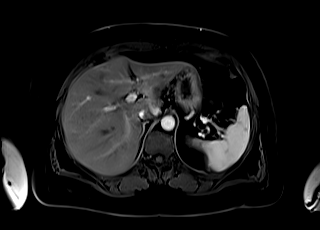
[im 88/88]
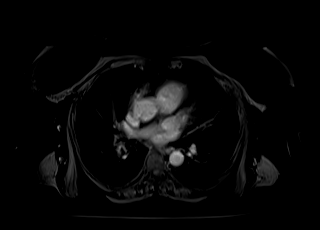

[Series 22: T1 dynamic · axial · 3.0mm · 1.41mm/px · z∈[-62,+199]mm · 3 of 88 slices shown (3 of 10)]
[im 1/88]
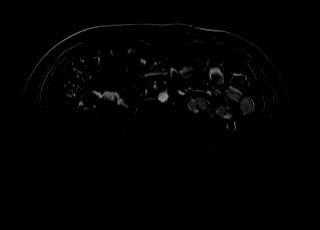
[im 44/88]
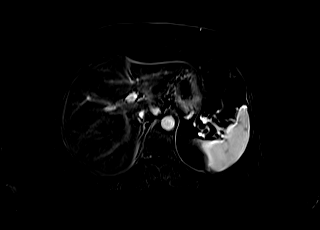
[im 88/88]
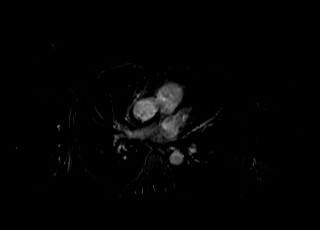

[Series 25: T1 dynamic · axial · 3.0mm · 1.41mm/px · z∈[-62,+199]mm · 3 of 88 slices shown (4 of 10)]
[im 1/88]
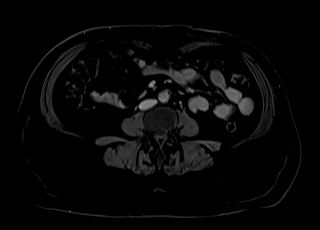
[im 44/88]
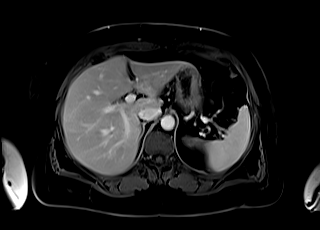
[im 88/88]
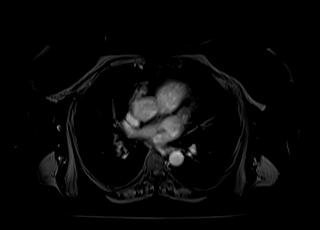

[Series 26: T1 dynamic · axial · 3.0mm · 1.41mm/px · z∈[-62,+199]mm · 3 of 88 slices shown (5 of 10)]
[im 1/88]
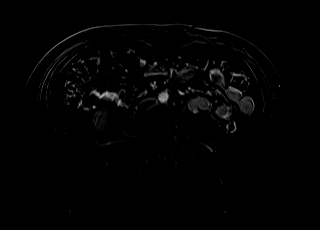
[im 44/88]
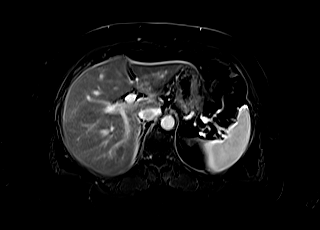
[im 88/88]
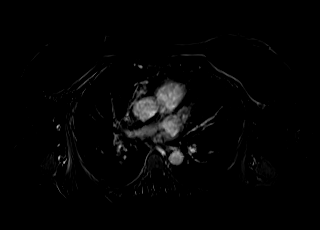

[Series 29: T1 dynamic · axial · 3.0mm · 1.41mm/px · z∈[-62,+199]mm · 3 of 88 slices shown (6 of 10)]
[im 1/88]
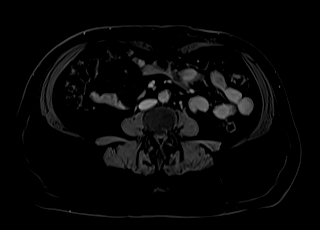
[im 44/88]
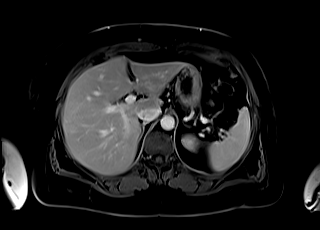
[im 88/88]
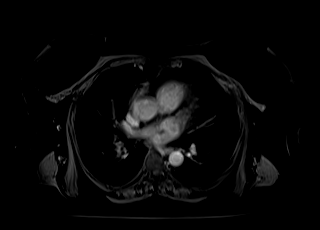

[Series 30: T1 dynamic · axial · 3.0mm · 1.41mm/px · z∈[-62,+199]mm · 3 of 88 slices shown (7 of 10)]
[im 1/88]
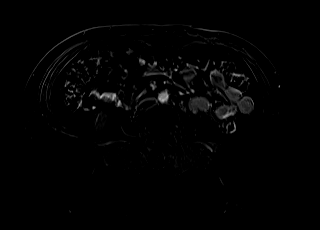
[im 44/88]
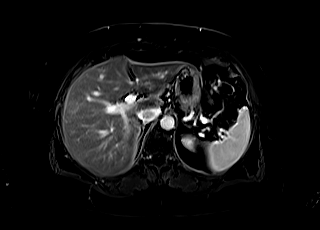
[im 88/88]
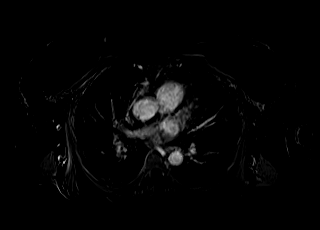

[Series 32: T1 dynamic · coronal · 5.0mm · 1.41mm/px · 2 of 52 slices shown (8 of 10)]
[im 1/52]
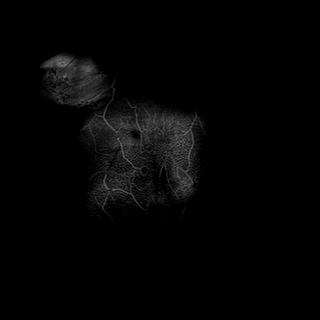
[im 52/52]
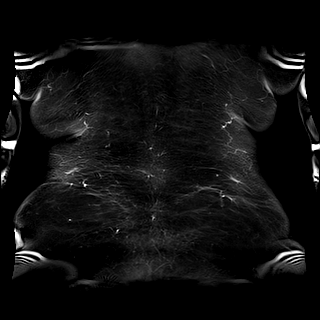

[Series 35: T1 dynamic · axial · 3.0mm · 1.41mm/px · z∈[-62,+199]mm · 3 of 88 slices shown (9 of 10)]
[im 1/88]
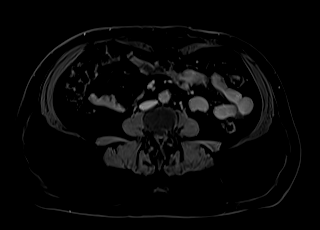
[im 44/88]
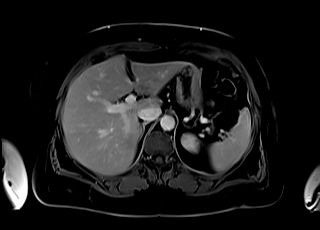
[im 88/88]
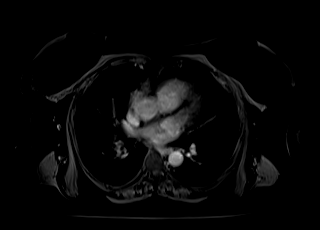

[Series 36: T1 dynamic · axial · 3.0mm · 1.41mm/px · z∈[-62,+199]mm · 3 of 88 slices shown (10 of 10)]
[im 1/88]
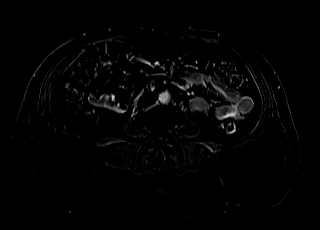
[im 44/88]
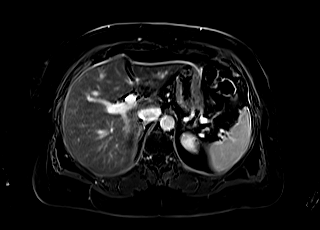
[im 88/88]
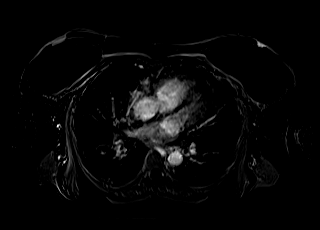

[46 of 48 positions shown; findings below may reference images not displayed]

FINDINGS: Lower chest: Bilateral breast implants. Mild cardiomegaly. Tiny
hiatal hernia. No pericardial or pleural effusion.

Hepatobiliary: Hepatomegaly at 19.3 cm craniocaudal. Normal
gallbladder. No intra or extrahepatic biliary duct dilatation. The
common duct measures maximally 4 mm on 51/13. No
choledocholithiasis.

Pancreas:  Normal, without mass or ductal dilatation.

Spleen:  Normal in size, without focal abnormality.

Adrenals/Urinary Tract: Normal adrenal glands. Normal kidneys,
without hydronephrosis.

Stomach/Bowel: Normal remainder of the stomach and abdominal bowel
loops.

Vascular/Lymphatic: Aortic atherosclerosis. No retroperitoneal or
retrocrural adenopathy.

Other:  No ascites.

Musculoskeletal: No acute osseous abnormality.
IMPRESSION: 1. No evidence of biliary duct dilatation or choledocholithiasis.
2. No evidence of metastatic disease.
3. Mild hepatomegaly.
4.  Tiny hiatal hernia.
5.  Aortic Atherosclerosis ([ER]-[ER]).

## 2020-11-04 MED ORDER — GADOBUTROL 1 MMOL/ML IV SOLN
10.0000 mL | Freq: Once | INTRAVENOUS | Status: AC | PRN
  Administered 2020-11-04: 10 mL via INTRAVENOUS

## 2020-11-13 ENCOUNTER — Other Ambulatory Visit: Payer: Self-pay | Admitting: Cardiology

## 2020-11-13 DIAGNOSIS — E1169 Type 2 diabetes mellitus with other specified complication: Secondary | ICD-10-CM

## 2020-11-13 DIAGNOSIS — I5022 Chronic systolic (congestive) heart failure: Secondary | ICD-10-CM

## 2020-11-20 ENCOUNTER — Other Ambulatory Visit (HOSPITAL_COMMUNITY): Payer: Self-pay | Admitting: Neurological Surgery

## 2020-11-20 DIAGNOSIS — M5416 Radiculopathy, lumbar region: Secondary | ICD-10-CM

## 2020-12-02 ENCOUNTER — Other Ambulatory Visit: Payer: Self-pay | Admitting: Allergy

## 2020-12-04 NOTE — Telephone Encounter (Signed)
Patient was last seen in 2021 and a refill was sent in July of 2021. She cancelled her appointment and no showed for 2 appointment for Dr. Neldon Mc in 2022.

## 2020-12-05 NOTE — Telephone Encounter (Signed)
Please advise 

## 2020-12-06 ENCOUNTER — Other Ambulatory Visit: Payer: Self-pay

## 2020-12-06 MED ORDER — MONTELUKAST SODIUM 10 MG PO TABS: 10.0000 mg | ORAL_TABLET | Freq: Every day | ORAL | 0 refills | Status: DC

## 2020-12-06 NOTE — Telephone Encounter (Signed)
Called patient to let her know a 30 day courtesy refill has been sent in to the pharmacy. In the voicemail I did let her know no further refills will be sent until she come in for a follow up appointment.

## 2020-12-09 ENCOUNTER — Other Ambulatory Visit: Payer: Self-pay | Admitting: Cardiology

## 2020-12-09 DIAGNOSIS — I5022 Chronic systolic (congestive) heart failure: Secondary | ICD-10-CM

## 2020-12-18 ENCOUNTER — Other Ambulatory Visit: Payer: Self-pay

## 2020-12-18 ENCOUNTER — Ambulatory Visit: Payer: No Typology Code available for payment source | Admitting: Internal Medicine

## 2020-12-18 ENCOUNTER — Encounter: Payer: Self-pay | Admitting: Internal Medicine

## 2020-12-18 VITALS — BP 100/70 | HR 85 | Ht 70.0 in | Wt 240.0 lb

## 2020-12-18 DIAGNOSIS — E669 Obesity, unspecified: Secondary | ICD-10-CM

## 2020-12-18 DIAGNOSIS — Z9889 Other specified postprocedural states: Secondary | ICD-10-CM

## 2020-12-18 DIAGNOSIS — Z8616 Personal history of COVID-19: Secondary | ICD-10-CM

## 2020-12-18 DIAGNOSIS — E1169 Type 2 diabetes mellitus with other specified complication: Secondary | ICD-10-CM

## 2020-12-18 DIAGNOSIS — E039 Hypothyroidism, unspecified: Secondary | ICD-10-CM | POA: Diagnosis not present

## 2020-12-18 DIAGNOSIS — Z6834 Body mass index (BMI) 34.0-34.9, adult: Secondary | ICD-10-CM

## 2020-12-18 DIAGNOSIS — I427 Cardiomyopathy due to drug and external agent: Secondary | ICD-10-CM

## 2020-12-18 DIAGNOSIS — Z853 Personal history of malignant neoplasm of breast: Secondary | ICD-10-CM

## 2020-12-18 DIAGNOSIS — L508 Other urticaria: Secondary | ICD-10-CM

## 2020-12-19 LAB — TSH: TSH: 2.79 mIU/L

## 2020-12-19 LAB — HEMOGLOBIN A1C
Hgb A1c MFr Bld: 6 % of total Hgb — ABNORMAL HIGH (ref <5.7)
Mean Plasma Glucose: 126 mg/dL
eAG (mmol/L): 7 mmol/L

## 2021-01-03 ENCOUNTER — Other Ambulatory Visit: Payer: Self-pay | Admitting: Allergy and Immunology

## 2021-01-03 ENCOUNTER — Other Ambulatory Visit: Payer: Self-pay

## 2021-01-03 DIAGNOSIS — I5022 Chronic systolic (congestive) heart failure: Secondary | ICD-10-CM

## 2021-01-03 DIAGNOSIS — E039 Hypothyroidism, unspecified: Secondary | ICD-10-CM

## 2021-01-03 MED ORDER — VALSARTAN 80 MG PO TABS: 80.0000 mg | ORAL_TABLET | Freq: Every evening | ORAL | 1 refills | Status: DC

## 2021-01-03 MED ORDER — LEVOTHYROXINE SODIUM 112 MCG PO TABS: 112.0000 ug | ORAL_TABLET | Freq: Every day | ORAL | 0 refills | Status: DC

## 2021-01-04 ENCOUNTER — Other Ambulatory Visit: Payer: Self-pay | Admitting: *Deleted

## 2021-01-04 DIAGNOSIS — Z171 Estrogen receptor negative status [ER-]: Secondary | ICD-10-CM

## 2021-01-06 NOTE — Progress Notes (Signed)
Patient Care Team: Elby Showers, MD as PCP - General (Internal Medicine) Baxley, Cresenciano Lick, MD (Internal Medicine)  DIAGNOSIS:    ICD-10-CM   1. Malignant neoplasm of upper-outer quadrant of right breast in female, estrogen receptor negative ([place])  C50.411    Z17.1       SUMMARY OF ONCOLOGIC HISTORY: Oncology History  Malignant neoplasm of upper-outer quadrant of right breast in female, estrogen receptor negative ([place])  07/09/2017 Initial Diagnosis   Patient palpated a right breast mass which was evaluated by mammogram and ultrasound and a breast MRI which revealed 4.5 x 4 cm necrotic tumor with suspicious multiple right axillary lymph nodes; biopsy revealed IDC grade 3 ER 0%, PR 0%, HER-2 positive ratio 3.08, Ki-67 70%, T2NX stage IIa/IIb   07/18/2017 - 11/04/2017 Neo-Adjuvant Chemotherapy   TCH Perjeta x6 cycles followed by Herceptin Perjeta maintenance   08/15/2017 Genetic Testing   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  The Multi-Gene Panel offered by [facility] includes sequencing and/or deletion duplication testing of the following 83 genes: ALK, APC, ATM, AXIN2,BAP1,  BARD1, BLM, BMPR1A, BRCA1, BRCA2, BRIP1, CASR, CDC73, CDH1, CDK4, CDKN1B, CDKN1C, CDKN2A (p14ARF), CDKN2A (p16INK4a), CEBPA, CHEK2, CTNNA1, DICER1, DIS3L2, EGFR (c.2369C>T, p.Thr790Met variant only), EPCAM (Deletion/duplication testing only), FH, FLCN, GATA2, GPC3, GREM1 (Promoter region deletion/duplication testing only), HOXB13 (c.251G>A, p.Gly84Glu), HRAS, KIT, MAX, MEN1, MET, MITF (c.952G>A, p.Glu318Lys variant only), MLH1, MSH2, MSH3, MSH6, MUTYH, NBN, NF1, NF2, NTHL1, PALB2, PDGFRA, PHOX2B, PMS2, POLD1, POLE, POT1, PRKAR1A, PTCH1, PTEN, RAD50, RAD51C, RAD51D, RB1, RECQL4, RET, RUNX1, SDHAF2, SDHA (sequence changes only), SDHB, SDHC, SDHD, SMAD4, SMARCA4, SMARCB1, SMARCE1, STK11, SUFU, TERT, TERT, TMEM127, TP53, TSC1, TSC2, VHL, WRN and WT1.  The report date is August 15, 2017.   11/18/2017  Surgery   Bilateral mastectomies: Right mastectomy: IDC grade 3 3.8 cm margins negative, 0/4 lymph nodes negative, ER 0%, PR 0%, HER-2 negative ratio 1.3, IHC HER-2 negative; Ki-67 70%, RCB class II; left mastectomy: [facility]   12/18/2017 - 01/27/2018 Chemotherapy   Adjuvant chemotherapy with dose dense Adriamycin and Cytoxan x4   02/17/2018 -  Chemotherapy   Maintenance Herceptin and Perjeta   02/24/2018 Surgery   REMOVAL OF BILATERAL TISSUE EXPANDERS WITH PLACEMENT OF BILATERAL BREAST IMPLANTS and LIPOFILLING FROM ABDOMEN TO BILATERAL CHEST by Dr. Iran Planas      CHIEF COMPLIANT: Follow-up of right breast cancer   INTERVAL HISTORY: Rebecca [name] is a 50 y.o. with above-mentioned history of right breast cancer having undergone bilateral lumpectomies. CT CAP on 06/07/2020 showed no evidence of metastatic disease within the chest, abdomen, or pelvis. She presents to the clinic today for follow-up.  She has recovered very well from the COVID infection.  She had a really rough time with that during the hospitalization and for couple of months after discharge.  She was able to wean off oxygen.  She denies any pain or discomfort in the chest.  ALLERGIES:  is allergic to entresto [sacubitril-valsartan].  MEDICATIONS:  Current Outpatient Medications  Medication Sig Dispense Refill   Ascorbic Acid (VITAMIN C GUMMIE PO) Take 2 tablets by mouth daily.     budesonide-formoterol (SYMBICORT) 160-4.5 MCG/ACT inhaler Inhale 2 puffs into the lungs 2 (two) times daily. 1 each 5   carvedilol (COREG) 12.5 MG tablet Take 1 tablet (12.5 mg total) by mouth 2 (two) times daily. 180 tablet 3   cetirizine (ZYRTEC) 10 MG tablet Take 10 mg by mouth daily.     Cholecalciferol (VITAMIN D3 GUMMIES  PO) Take 5,000 Units by mouth daily.     EPINEPHrine 0.3 mg/0.3 mL IJ SOAJ injection Use as directed for life threatening allergic reactions only (Patient taking differently: Inject 0.3 mg into the muscle as needed for  anaphylaxis. Use as directed for life threatening allergic reactions only) 2 each 3   esomeprazole (NEXIUM) 40 MG capsule TAKE 1 CAPSULE (40 MG TOTAL) BY MOUTH DAILY AT 12 NOON. 90 capsule 2   furosemide (LASIX) 40 MG tablet TAKE 1 TABLET EVERY DAY AS NEEDED FOR FLUID *SHORTNESS OF BREATH* 30 tablet 1   ipratropium (ATROVENT HFA) 17 MCG/ACT inhaler Inhale 2 puffs into the lungs every 4 (four) hours. 1 each 1   JARDIANCE 10 MG TABS tablet TAKE 1 TABLET BY MOUTH DAILY BEFORE BREAKFAST. 90 tablet 1   levothyroxine (SYNTHROID) 112 MCG tablet Take 1 tablet (112 mcg total) by mouth daily. 90 tablet 0   liraglutide (VICTOZA) 18 MG/3ML SOPN Inject 1.8 mg into the skin daily. 18 mL 2   Liraglutide -Weight Management (SAXENDA) 18 MG/3ML SOPN Inject 1.8 mg SQ daily (Patient not taking: Reported on 12/18/2020) 3 mL 3   LORazepam (ATIVAN) 1 MG tablet One po bid prn 60 tablet 2   montelukast (SINGULAIR) 10 MG tablet Take 1 tablet (10 mg total) by mouth at bedtime. 30 tablet 0   Multiple Vitamin (MULTIVITAMIN PO) Take 1 tablet by mouth daily. Multivitamin + Omega-3     rosuvastatin (CRESTOR) 20 MG tablet Take 1 tablet (20 mg total) by mouth daily. 90 tablet 3   TRESIBA FLEXTOUCH 100 UNIT/ML FlexTouch Pen Inject 10 Units into the skin daily.     valsartan (DIOVAN) 80 MG tablet Take 1 tablet (80 mg total) by mouth every evening. 90 tablet 1   venlafaxine XR (EFFEXOR-XR) 150 MG 24 hr capsule TAKE 1 CAPSULE BY MOUTH DAILY WITH BREAKFAST. 90 capsule 0   No current facility-administered medications for this visit.    PHYSICAL EXAMINATION: ECOG PERFORMANCE STATUS: 1 - Symptomatic but completely ambulatory  Vitals:   01/08/21 1450  BP: 127/84  Pulse: 87  Resp: 18  Temp: (!) 97.3 F (36.3 C)  SpO2: 97%   Filed Weights   01/08/21 1450  Weight: 236 lb 8 oz (107.3 kg)    BREAST: No palpable masses or nodules in either right or left breasts. (exam performed in the presence of a chaperone)  LABORATORY DATA:   I have reviewed the data as listed CMP Latest Ref Rng & Units 01/08/2021 10/27/2020 05/11/2020  Glucose 70 - 99 mg/dL 97 - 98  BUN 6 - 20 mg/dL 19 - 16  Creatinine 0.44 - 1.00 mg/dL 1.12(H) - 0.90  Sodium 135 - 145 mmol/L 141 - 140  Potassium 3.5 - 5.1 mmol/L 3.9 - 4.6  Chloride 98 - 111 mmol/L 98 - 99  CO2 22 - 32 mmol/L 32 - 25  Calcium 8.9 - 10.3 mg/dL 9.8 - 9.5  Total Protein 6.5 - 8.1 g/dL 7.4 6.7 6.4  Total Bilirubin 0.3 - 1.2 mg/dL 0.3 0.3 0.3  Alkaline Phos 38 - 126 U/L 75 64 68  AST 15 - 41 U/L 20 22 14   ALT 0 - 44 U/L 21 24 16     Lab Results  Component Value Date   WBC 11.2 (H) 01/08/2021   HGB 14.0 01/08/2021   HCT 44.8 01/08/2021   MCV 82.1 01/08/2021   PLT 310 01/08/2021   NEUTROABS 7.1 01/08/2021    ASSESSMENT & PLAN:  Malignant neoplasm  of upper-outer quadrant of right breast in female, estrogen receptor negative ([place]) 07/09/2017:Patient palpated a right breast mass which was evaluated by mammogram and ultrasound and a breast MRI which revealed 4.5 x 4 cm necrotic tumor with suspicious multiple right axillary lymph nodes; biopsy revealed IDC grade 3 ER 0%, PR 0%, HER-2 positive ratio 3.08, Ki-67 70%, T2NX stage IIa/IIb   Treatment plan based on multidisciplinary tumor board: 1. Neoadjuvant chemotherapy with TCH Perjeta 6 cycles followed by Herceptin and Perjeta maintenance for 1 year 2. Followed by bilateral mastectomies with targeted node dissection on the right 11/18/2017 3.  Due to significant residual disease, additional adjuvant chemo with Adriamycin and Cytoxan 12/18/2017-01/27/2018 4.  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  Left ventricle regional wall motion abnormalities. This is most definitely related to prior Adriamycin treatment.   CT CAP 03/29/2019: No findings of metastatic disease.  Stability of 2 to 3 mm right upper lobe nodule. Palpable nodularity right reconstructed breast: We will obtain ultrasound for further evaluation.  If the ultrasound does not show a clear abnormalities we may have to consider doing a breast MRI.   Abd MRI 11/05/20: No biliary duct dil. No evidence of Met disease We will plan to do CT chest abdomen pelvis in February 2023.  Patient tells me that her heart function is slowly improving with a EF close to 50%. We discussed different options for weight loss and I suggested treatment with Monjuvi.  She will read about it and inform us of her decision.  We may be able to start her on the treatment after next week.  Return to clinic in 1 year for follow-up    No orders of the defined types were placed in this encounter.  The patient has a good understanding of the overall plan. she agrees with it. she will call with any problems that may develop before the next visit here.  Total time spent: 20 mins including face to face time and time spent for planning, charting and coordination of care  Rulon Eisenmenger, MD, MPH 01/08/2021  I, Thana Ates, am acting as scribe for Dr. Nicholas Lose.  I have reviewed the above documentation for accuracy and completeness, and I agree with the above.

## 2021-01-08 ENCOUNTER — Inpatient Hospital Stay: Payer: No Typology Code available for payment source | Attending: Hematology and Oncology

## 2021-01-08 ENCOUNTER — Inpatient Hospital Stay: Payer: No Typology Code available for payment source | Admitting: Hematology and Oncology

## 2021-01-08 ENCOUNTER — Other Ambulatory Visit: Payer: Self-pay

## 2021-01-08 DIAGNOSIS — Z79899 Other long term (current) drug therapy: Secondary | ICD-10-CM | POA: Diagnosis not present

## 2021-01-08 DIAGNOSIS — Z171 Estrogen receptor negative status [ER-]: Secondary | ICD-10-CM | POA: Insufficient documentation

## 2021-01-08 DIAGNOSIS — Z9221 Personal history of antineoplastic chemotherapy: Secondary | ICD-10-CM | POA: Diagnosis not present

## 2021-01-08 DIAGNOSIS — C50411 Malignant neoplasm of upper-outer quadrant of right female breast: Secondary | ICD-10-CM

## 2021-01-08 DIAGNOSIS — Z853 Personal history of malignant neoplasm of breast: Secondary | ICD-10-CM | POA: Diagnosis present

## 2021-01-08 DIAGNOSIS — Z7951 Long term (current) use of inhaled steroids: Secondary | ICD-10-CM | POA: Insufficient documentation

## 2021-01-08 DIAGNOSIS — Z9013 Acquired absence of bilateral breasts and nipples: Secondary | ICD-10-CM | POA: Insufficient documentation

## 2021-01-08 DIAGNOSIS — G620 Drug-induced polyneuropathy: Secondary | ICD-10-CM | POA: Insufficient documentation

## 2021-01-08 LAB — CBC WITH DIFFERENTIAL (CANCER CENTER ONLY)
Abs Immature Granulocytes: 0.03 10*3/uL (ref 0.00–0.07)
Basophils Absolute: 0 10*3/uL (ref 0.0–0.1)
Basophils Relative: 0 %
Eosinophils Absolute: 0 10*3/uL (ref 0.0–0.5)
Eosinophils Relative: 0 %
HCT: 44.8 % (ref 36.0–46.0)
Hemoglobin: 14 g/dL (ref 12.0–15.0)
Immature Granulocytes: 0 %
Lymphocytes Relative: 26 %
Lymphs Abs: 3 10*3/uL (ref 0.7–4.0)
MCH: 25.6 pg — ABNORMAL LOW (ref 26.0–34.0)
MCHC: 31.3 g/dL (ref 30.0–36.0)
MCV: 82.1 fL (ref 80.0–100.0)
Monocytes Absolute: 1.1 10*3/uL — ABNORMAL HIGH (ref 0.1–1.0)
Monocytes Relative: 10 %
Neutro Abs: 7.1 10*3/uL (ref 1.7–7.7)
Neutrophils Relative %: 64 %
Platelet Count: 310 10*3/uL (ref 150–400)
RBC: 5.46 MIL/uL — ABNORMAL HIGH (ref 3.87–5.11)
RDW: 14.9 % (ref 11.5–15.5)
WBC Count: 11.2 10*3/uL — ABNORMAL HIGH (ref 4.0–10.5)
nRBC: 0 % (ref 0.0–0.2)

## 2021-01-08 LAB — CMP (CANCER CENTER ONLY)
ALT: 21 U/L (ref 0–44)
AST: 20 U/L (ref 15–41)
Albumin: 4.3 g/dL (ref 3.5–5.0)
Alkaline Phosphatase: 75 U/L (ref 38–126)
Anion gap: 11 (ref 5–15)
BUN: 19 mg/dL (ref 6–20)
CO2: 32 mmol/L (ref 22–32)
Calcium: 9.8 mg/dL (ref 8.9–10.3)
Chloride: 98 mmol/L (ref 98–111)
Creatinine: 1.12 mg/dL — ABNORMAL HIGH (ref 0.44–1.00)
GFR, Estimated: 60 mL/min — ABNORMAL LOW (ref >60)
Glucose, Bld: 97 mg/dL (ref 70–99)
Potassium: 3.9 mmol/L (ref 3.5–5.1)
Sodium: 141 mmol/L (ref 135–145)
Total Bilirubin: 0.3 mg/dL (ref 0.3–1.2)
Total Protein: 7.4 g/dL (ref 6.5–8.1)

## 2021-01-08 NOTE — Assessment & Plan Note (Addendum)
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 Neoadjuvant chemotherapy with TCH Perjeta 6 cycles followed by Herceptinand Perjetamaintenance for 1 year 2. 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  Left ventricle regional wall motion abnormalities. This is most definitely related to prior Adriamycin treatment.  CT CAP 03/29/2019: No findings of metastatic disease. Stability of 2 to 3 mm right upper lobe nodule. Palpable nodularity right reconstructed breast: We will obtain ultrasound for further evaluation.  If the ultrasound does not show a clear abnormalities we may have to consider doing a breast MRI.  Abd MRI 11/05/20: No biliary duct dil. No evidence of Met disease We will plan to do CT chest abdomen pelvis in February 2023.  Patient tells me that her heart function is slowly improving with a EF close to 50%. Return to clinic in1 year for follow-up

## 2021-01-09 ENCOUNTER — Telehealth: Payer: Self-pay | Admitting: *Deleted

## 2021-01-09 NOTE — Telephone Encounter (Signed)
Spoke with patient to give her contact information for nipple tattoos. Patient requested that I email her the information.  Verified email address and sent information via email.

## 2021-01-12 ENCOUNTER — Other Ambulatory Visit: Payer: Self-pay | Admitting: Cardiology

## 2021-01-12 DIAGNOSIS — E782 Mixed hyperlipidemia: Secondary | ICD-10-CM

## 2021-01-15 ENCOUNTER — Other Ambulatory Visit: Payer: Self-pay

## 2021-01-15 ENCOUNTER — Ambulatory Visit (HOSPITAL_COMMUNITY)
Admission: RE | Admit: 2021-01-15 | Discharge: 2021-01-15 | Disposition: A | Discharge status: Home / Self Care | Payer: No Typology Code available for payment source | Source: Ambulatory Visit | Attending: Neurological Surgery | Admitting: Neurological Surgery

## 2021-01-15 DIAGNOSIS — M5416 Radiculopathy, lumbar region: Secondary | ICD-10-CM | POA: Insufficient documentation

## 2021-01-20 NOTE — Progress Notes (Signed)
   Subjective:    Patient ID: Rebecca [name], female    DOB: April 23, 1971, 50 y.o.   MRN: [id number]  HPI 50 year old Female seen today for follow up on Type 2 diabetes mellitus.  We were able to get Saxenda approved by her insurance But for some reason, she never got noticed that had been approved.  The prescription is at the pharmacy and she can go ahead and pick it up.  This should help with weight loss.  In May, her hemoglobin A1c was 7.3% and is now 6% which is great.  TSH is normal at 2.79 on levothyroxine 0.112 mg daily.  She remains on Nexium for GE reflux.  Continues to work from home.  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  Some days it is a bit difficult but she manages that well.  Says children are doing well.  She has a history of urticaria and has been seen by Dr. Neldon Mc.  History of chemotherapy-induced cardiomyopathy.  Review of Systems denies chest pain, shortness of breath     Objective:   Physical Exam Blood pressure 100/70 pulse 85 pulse oximetry 96% weight 240 pounds BMI 34.44  Weight in June was 242 pounds  Chest is clear to auscultation.  Cardiac exam: Regular rate and rhythm without ectopy.  Trace edema of the lower extremities.  Affect is normal.     Assessment & Plan:  BMI 34  History of COVID-19-patient should consider getting booster for BA5 variant which is highly contagious this Fall.  She should also get flu vaccine.  Type 2 diabetes mellitus-we have been able to get Saxenda approved and she will start that right away.  She will return in early October for follow-up.

## 2021-01-20 NOTE — Patient Instructions (Signed)
It was a pleasure to see you today.  You can start on Saxenda right away and we will follow-up again in early October.  We apologize for the confusion.

## 2021-01-26 ENCOUNTER — Other Ambulatory Visit: Payer: Self-pay | Admitting: Cardiology

## 2021-01-26 DIAGNOSIS — I427 Cardiomyopathy due to drug and external agent: Secondary | ICD-10-CM

## 2021-01-26 DIAGNOSIS — I5022 Chronic systolic (congestive) heart failure: Secondary | ICD-10-CM

## 2021-01-29 ENCOUNTER — Ambulatory Visit: Payer: No Typology Code available for payment source | Admitting: Internal Medicine

## 2021-02-12 MED ORDER — TRESIBA FLEXTOUCH 100 UNIT/ML ~~LOC~~ SOPN: 10.0000 [IU] | PEN_INJECTOR | Freq: Every day | SUBCUTANEOUS | 1 refills | Status: DC

## 2021-02-12 MED ORDER — INSULIN PEN NEEDLE 32G X 4 MM MISC: 1.0000 | Freq: Every day | 3 refills | Status: DC

## 2021-03-12 ENCOUNTER — Other Ambulatory Visit: Payer: Self-pay | Admitting: Internal Medicine

## 2021-04-08 ENCOUNTER — Other Ambulatory Visit: Payer: Self-pay | Admitting: Internal Medicine

## 2021-04-08 DIAGNOSIS — E039 Hypothyroidism, unspecified: Secondary | ICD-10-CM

## 2021-04-09 ENCOUNTER — Other Ambulatory Visit: Payer: Self-pay | Admitting: Internal Medicine

## 2021-04-10 ENCOUNTER — Encounter: Payer: Self-pay | Admitting: Internal Medicine

## 2021-04-10 ENCOUNTER — Other Ambulatory Visit: Payer: Self-pay | Admitting: Hematology and Oncology

## 2021-04-10 MED ORDER — MONTELUKAST SODIUM 10 MG PO TABS: 10.0000 mg | ORAL_TABLET | Freq: Every day | ORAL | 1 refills | Status: DC

## 2021-04-17 ENCOUNTER — Other Ambulatory Visit: Payer: Self-pay | Admitting: Hematology and Oncology

## 2021-04-17 ENCOUNTER — Encounter: Payer: Self-pay | Admitting: Hematology and Oncology

## 2021-04-17 DIAGNOSIS — N63 Unspecified lump in unspecified breast: Secondary | ICD-10-CM

## 2021-04-19 ENCOUNTER — Telehealth: Payer: Self-pay

## 2021-04-19 NOTE — Telephone Encounter (Signed)
Called pt to confirm her new appt on 12/29, pt aware and verbalized thanks.

## 2021-04-26 ENCOUNTER — Ambulatory Visit
Admission: RE | Admit: 2021-04-26 | Discharge: 2021-04-26 | Disposition: A | Discharge status: Home / Self Care | Payer: No Typology Code available for payment source | Source: Ambulatory Visit | Attending: Hematology and Oncology | Admitting: Hematology and Oncology

## 2021-04-26 ENCOUNTER — Encounter: Payer: Self-pay | Admitting: Hematology and Oncology

## 2021-04-26 DIAGNOSIS — N63 Unspecified lump in unspecified breast: Secondary | ICD-10-CM

## 2021-04-26 IMAGING — MG MM DIGITAL DIAGNOSTIC UNILAT*R* W/ TOMO W/ CAD
4 series · 4 of 12 positions shown · non-contrast
Comparison: Previous exam(s).

CLINICAL DATA: Patient with history of bilateral mastectomies for
follow-up of a palpable abnormality within the residual right breast
overlying the implant which had imaging appearance suggestive of fat
necrosis.

EXAM:
DIGITAL DIAGNOSTIC UNILATERAL RIGHT MAMMOGRAM WITH TOMOSYNTHESIS AND
CAD; ULTRASOUND RIGHT BREAST LIMITED
TECHNIQUE: Right digital diagnostic mammography and breast tomosynthesis was
performed. The images were evaluated with computer-aided detection.;
Targeted ultrasound examination of the right breast was performed

[R TAN synth-2D (1 of 2)]
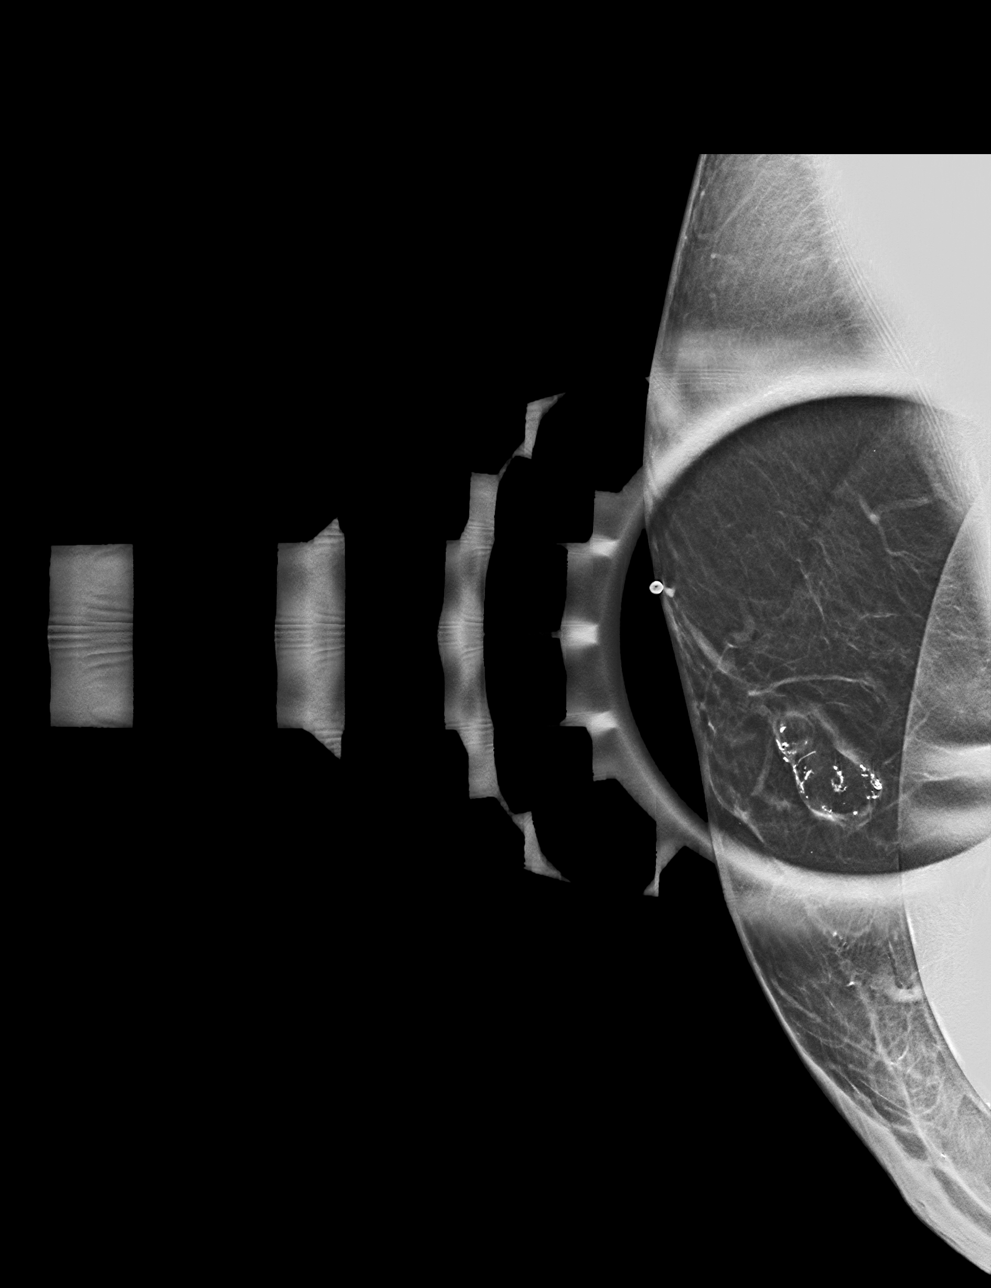

[R TAN synth-2D (2 of 2)]
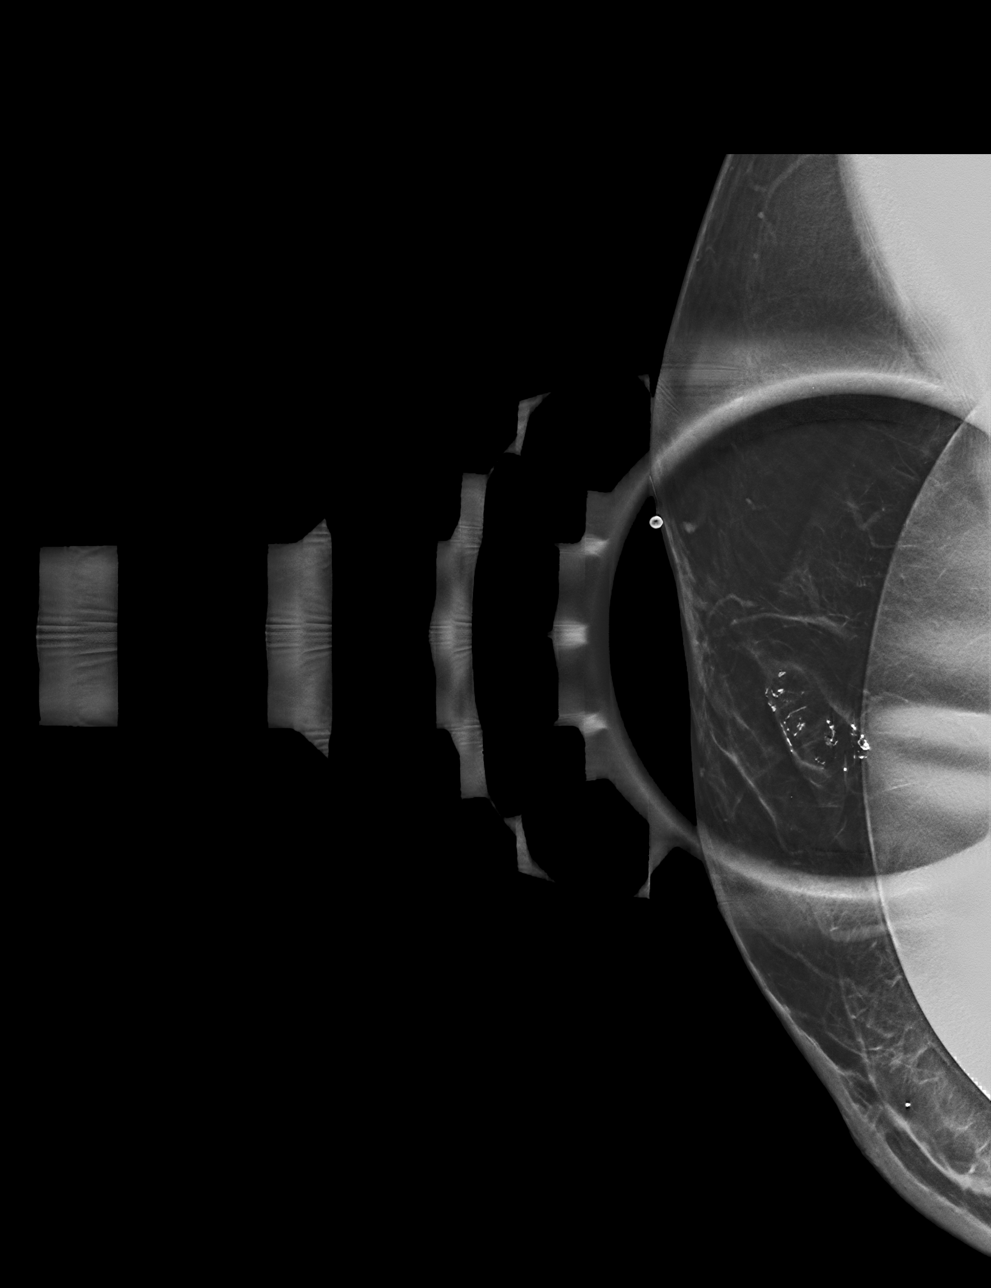

[R TAN tomo (1 of 2) · tomo slice 13/25.0]
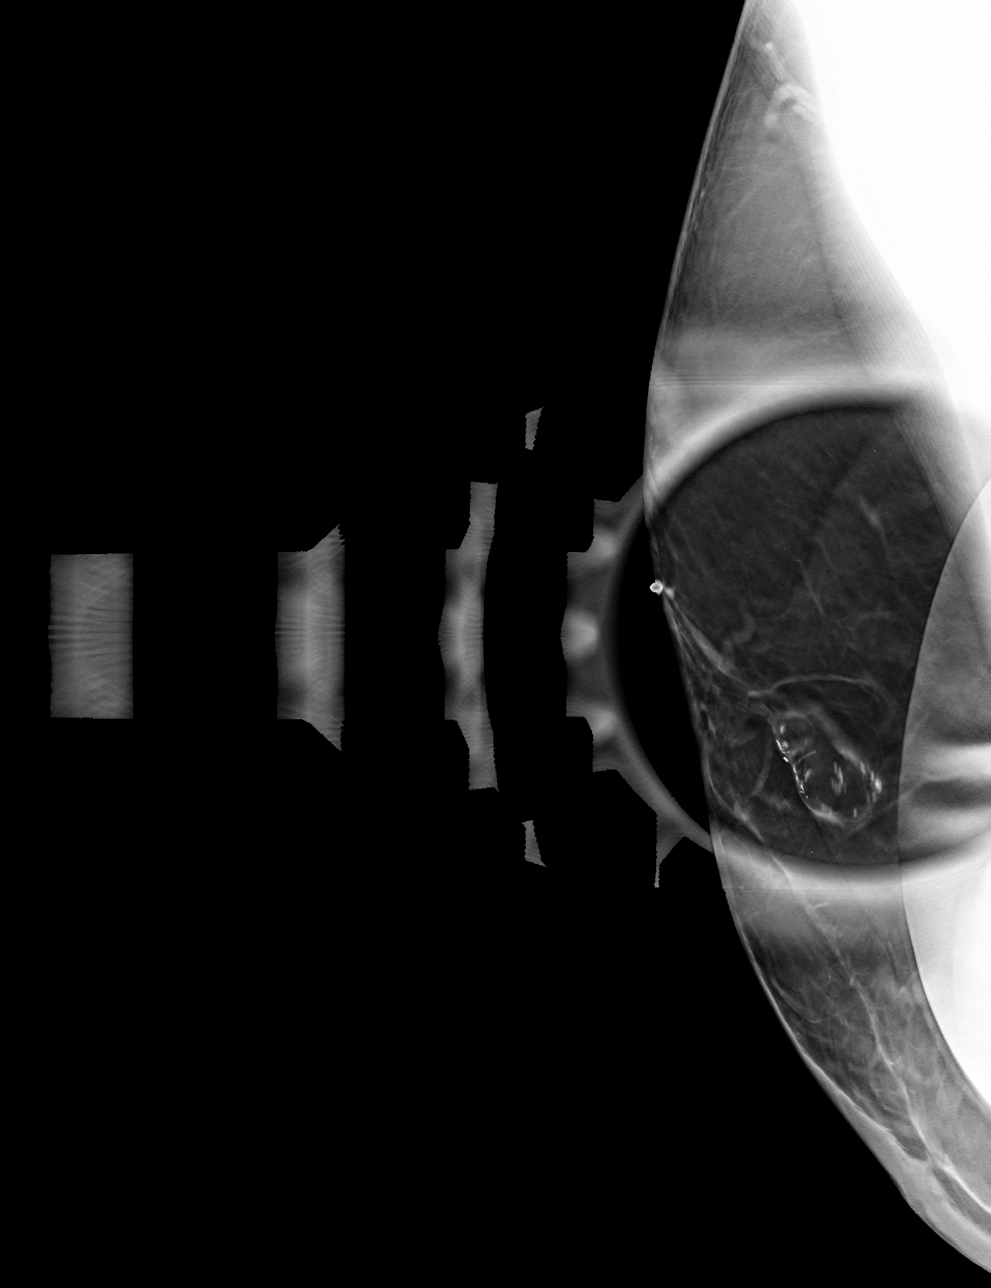

[R TAN tomo (2 of 2) · tomo slice 16/31.0]
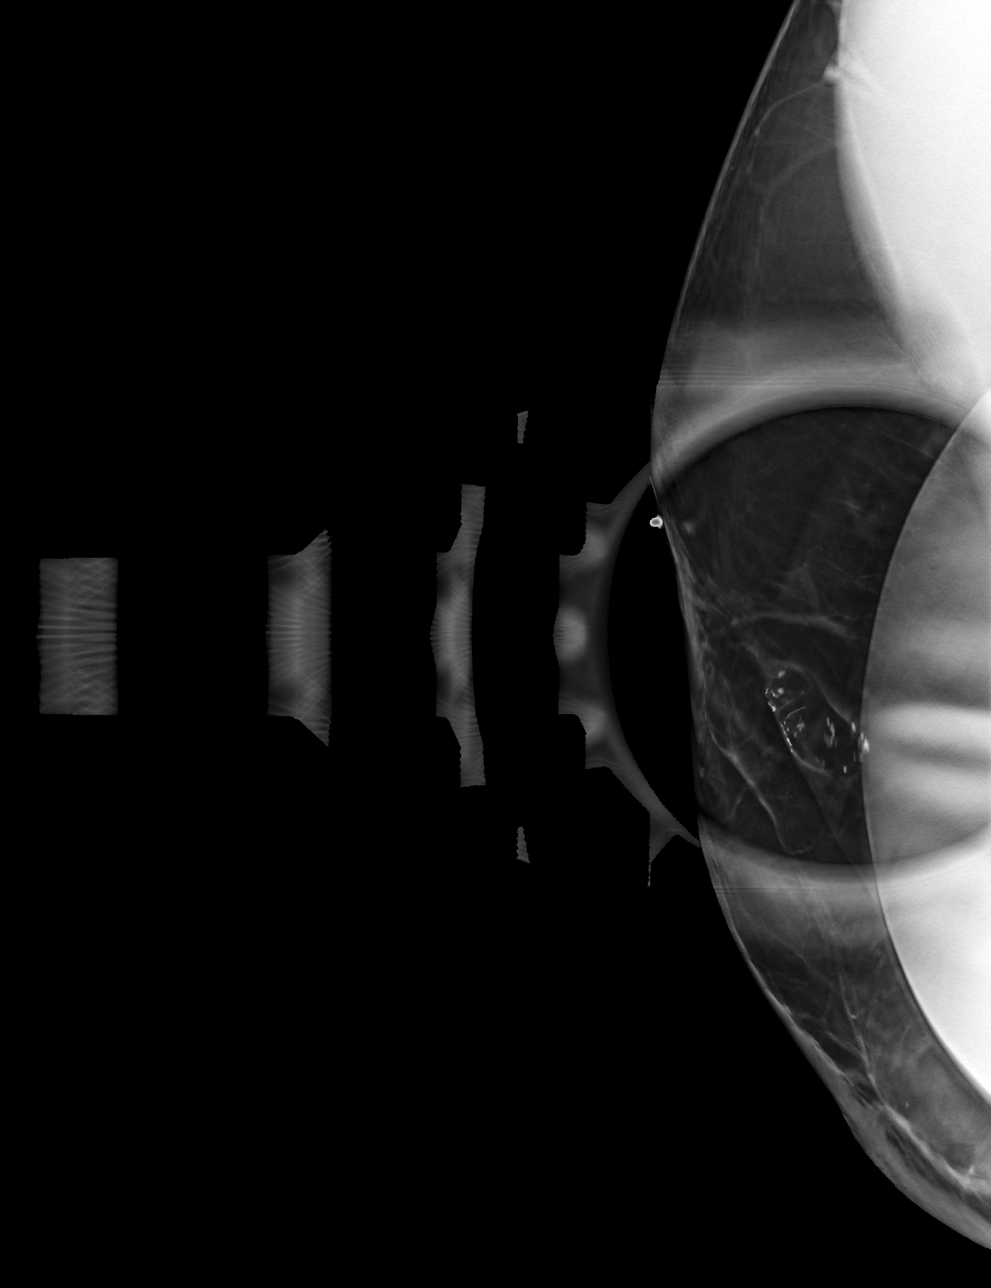

[4 of 12 positions shown; findings below may reference images not displayed]

ACR Breast Density Category b: There are scattered areas of
fibroglandular density.
FINDINGS: Grossly unchanged fat containing mass within the outer right breast
with peripheral rim shaped calcifications, overall suggestive of fat
necrosis. No concerning findings identified within the visualized
aspect of the right breast.

Targeted ultrasound is performed, showing grossly stable oval
circumscribed hypoechoic mass right breast 8:30 o'clock 8 cm at the
site of palpable concern measuring 10 x 5 x 6 mm and adjacent oval
circumscribed mass measuring 18 x 17 x 4 mm.
IMPRESSION: Stable to mild interval decrease in size of palpable masses within
the right breast suggestive of fat necrosis.

RECOMMENDATION:
Right breast spot tangential mammogram and ultrasound in 12 months
to demonstrate greater than 2 years of stability of palpable area of
concern in the right breast favored to represent fat necrosis.
Patient was instructed to perform interval clinical exams and return
sooner should any change be noted.

I have discussed the findings and recommendations with the patient.
If applicable, a reminder letter will be sent to the patient
regarding the next appointment.

BI-RADS CATEGORY  3: Probably benign.

## 2021-04-26 IMAGING — US US BREAST*R* LIMITED INC AXILLA
1 series · 8 of 8 positions shown · non-contrast
Comparison: Previous exam(s).

CLINICAL DATA: Patient with history of bilateral mastectomies for
follow-up of a palpable abnormality within the residual right breast
overlying the implant which had imaging appearance suggestive of fat
necrosis.

EXAM:
DIGITAL DIAGNOSTIC UNILATERAL RIGHT MAMMOGRAM WITH TOMOSYNTHESIS AND
CAD; ULTRASOUND RIGHT BREAST LIMITED
TECHNIQUE: Right digital diagnostic mammography and breast tomosynthesis was
performed. The images were evaluated with computer-aided detection.;
Targeted ultrasound examination of the right breast was performed

[Series 1: us breast*right* limited inc axilla · 0.05mm/px · 8 of 8 slices shown]
[im 1/8]
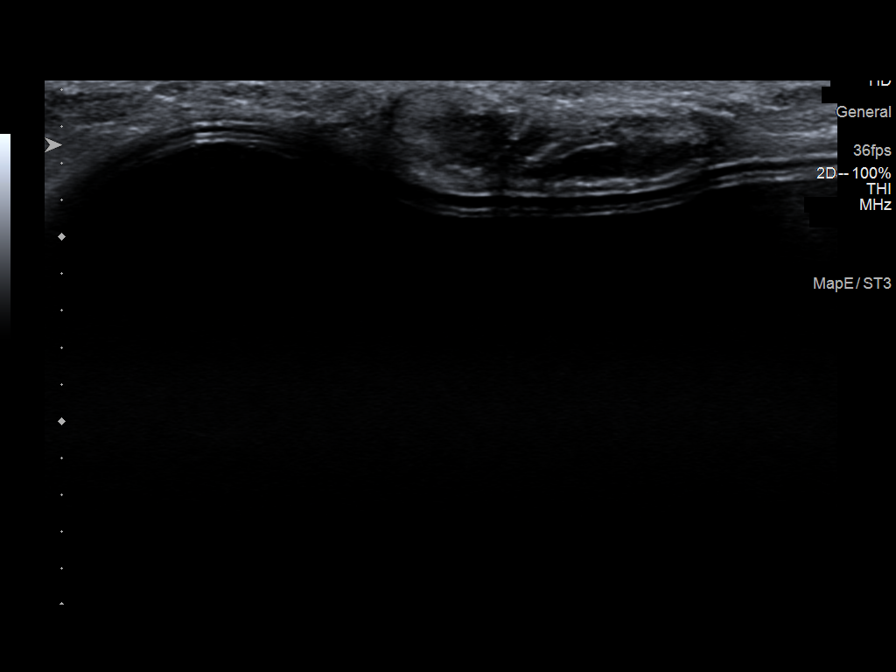
[im 2/8]
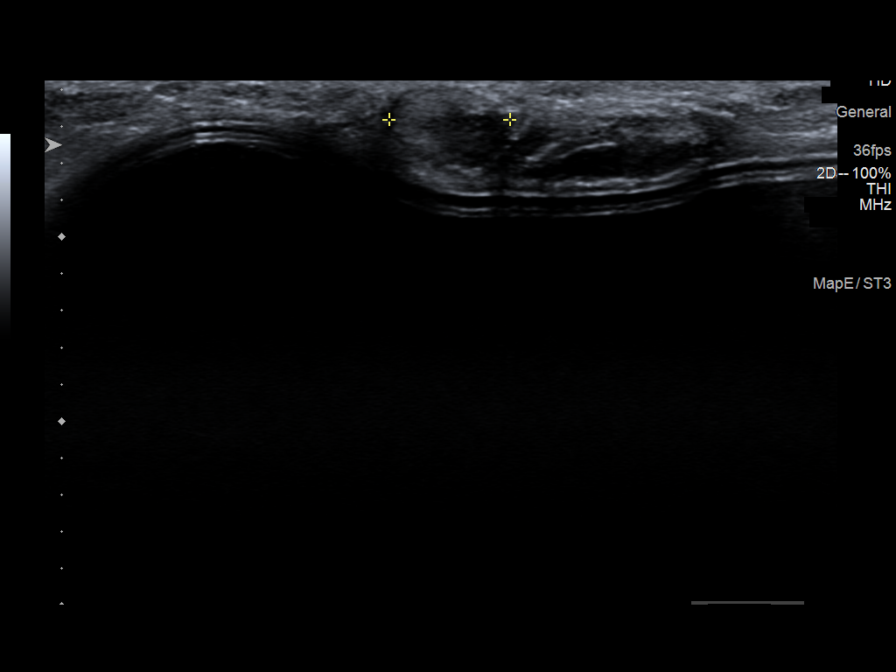
[im 3/8]
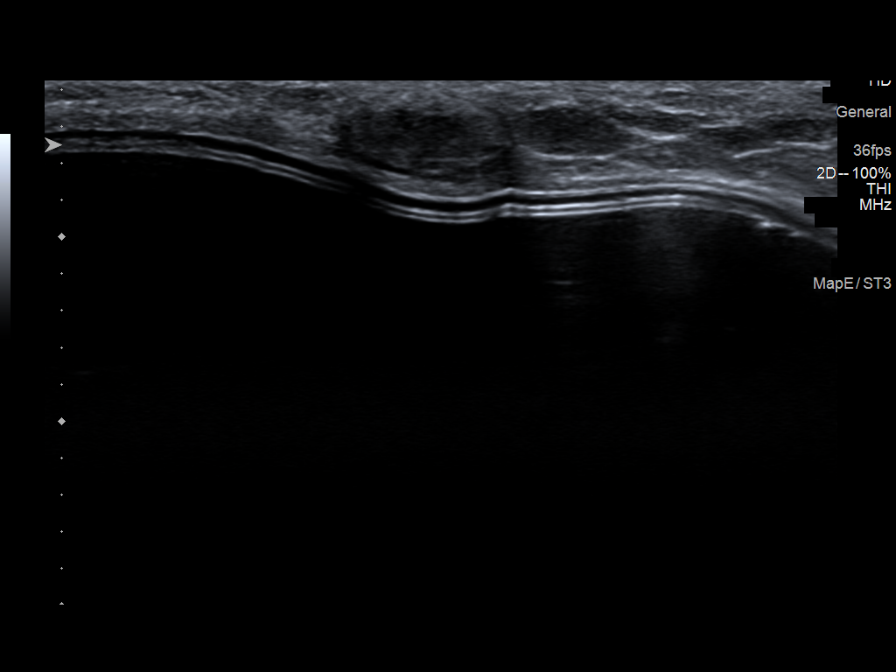
[im 4/8]
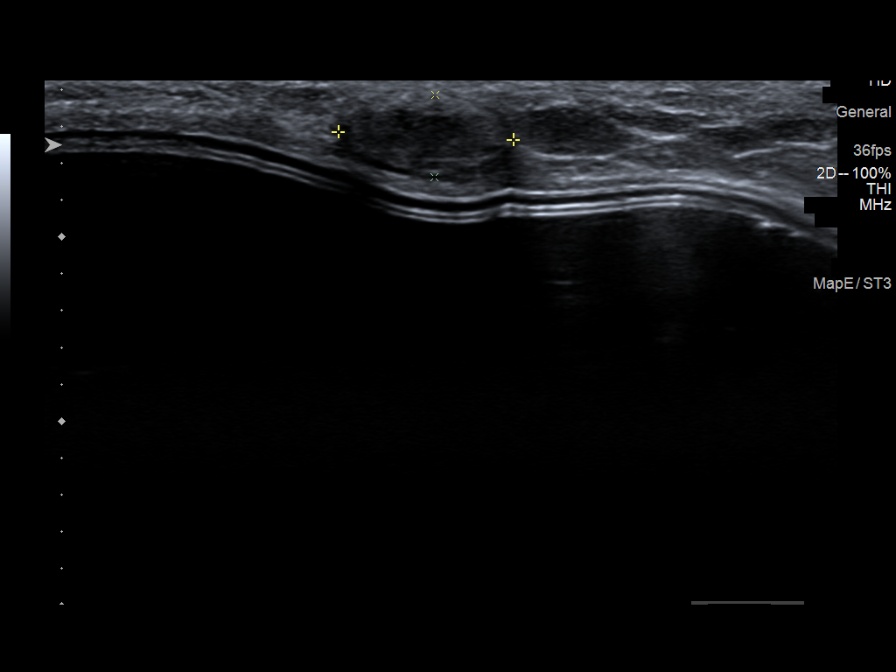
[im 5/8]
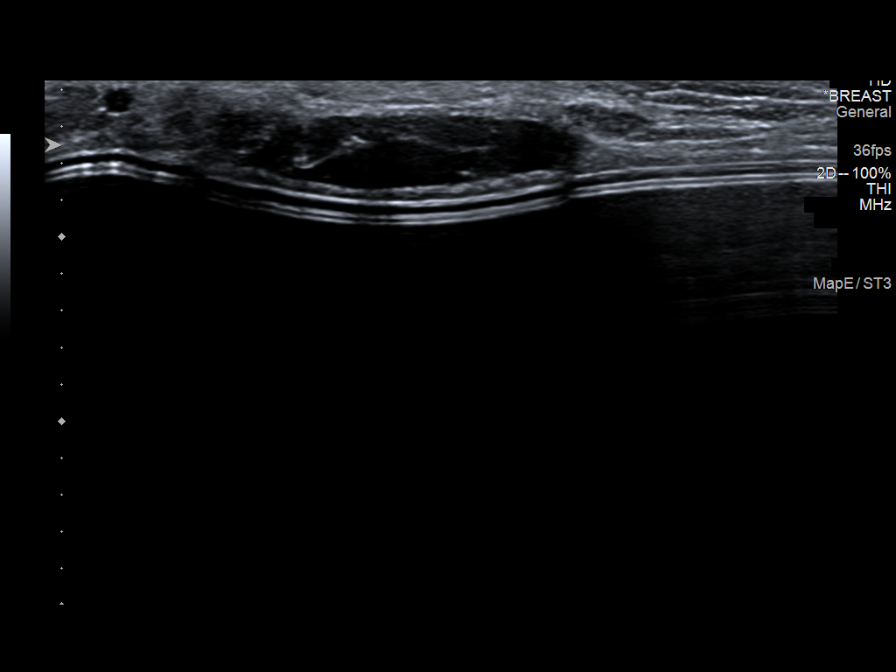
[im 6/8]
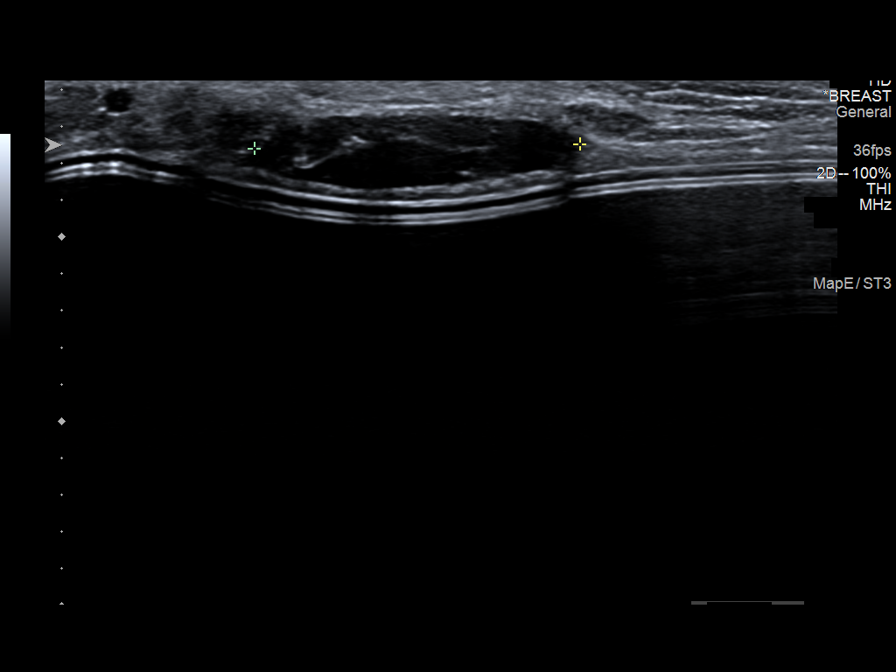
[im 7/8]
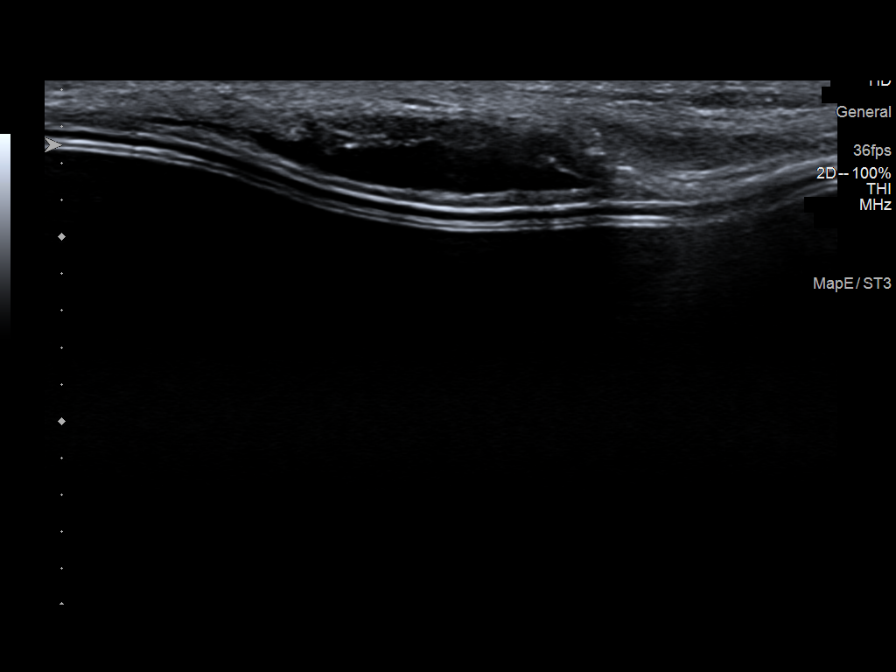
[im 8/8]
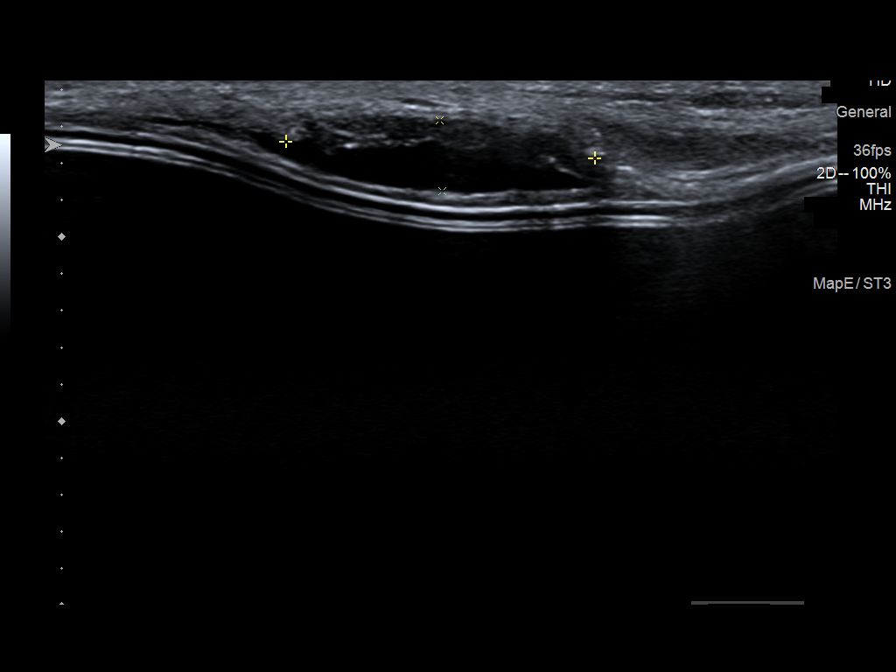

[8 of 8 positions shown; findings below may reference images not displayed]

ACR Breast Density Category b: There are scattered areas of
fibroglandular density.
FINDINGS: Grossly unchanged fat containing mass within the outer right breast
with peripheral rim shaped calcifications, overall suggestive of fat
necrosis. No concerning findings identified within the visualized
aspect of the right breast.

Targeted ultrasound is performed, showing grossly stable oval
circumscribed hypoechoic mass right breast 8:30 o'clock 8 cm at the
site of palpable concern measuring 10 x 5 x 6 mm and adjacent oval
circumscribed mass measuring 18 x 17 x 4 mm.
IMPRESSION: Stable to mild interval decrease in size of palpable masses within
the right breast suggestive of fat necrosis.

RECOMMENDATION:
Right breast spot tangential mammogram and ultrasound in 12 months
to demonstrate greater than 2 years of stability of palpable area of
concern in the right breast favored to represent fat necrosis.
Patient was instructed to perform interval clinical exams and return
sooner should any change be noted.

I have discussed the findings and recommendations with the patient.
If applicable, a reminder letter will be sent to the patient
regarding the next appointment.

BI-RADS CATEGORY  3: Probably benign.

## 2021-05-09 ENCOUNTER — Encounter: Payer: Self-pay | Admitting: Internal Medicine

## 2021-05-09 LAB — HM DIABETES EYE EXAM

## 2021-05-11 ENCOUNTER — Other Ambulatory Visit: Payer: Self-pay

## 2021-05-11 ENCOUNTER — Ambulatory Visit (INDEPENDENT_AMBULATORY_CARE_PROVIDER_SITE_OTHER): Payer: No Typology Code available for payment source

## 2021-05-11 ENCOUNTER — Encounter: Payer: Self-pay | Admitting: Podiatry

## 2021-05-11 ENCOUNTER — Ambulatory Visit (INDEPENDENT_AMBULATORY_CARE_PROVIDER_SITE_OTHER): Payer: No Typology Code available for payment source | Admitting: Podiatry

## 2021-05-11 DIAGNOSIS — M722 Plantar fascial fibromatosis: Secondary | ICD-10-CM

## 2021-05-11 DIAGNOSIS — M79672 Pain in left foot: Secondary | ICD-10-CM

## 2021-05-11 IMAGING — DX DG FOOT COMPLETE 3+V*L*
3 series · 3 of 3 positions shown · non-contrast
Comparison: None.

CLINICAL DATA: Chronic left heel pain.

EXAM:
LEFT FOOT - COMPLETE 3+ VIEW, weight-bearing.

[foot ap wb]
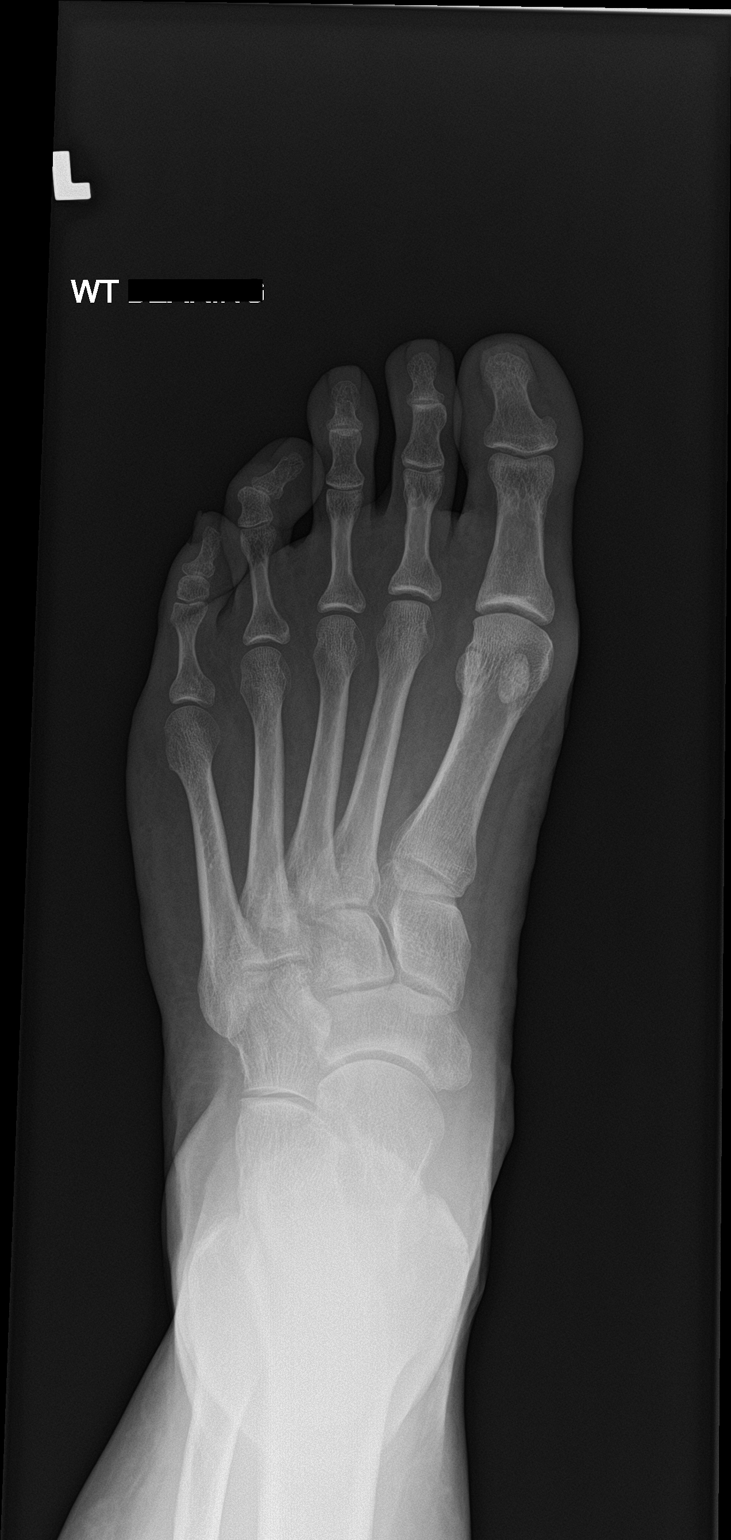

[foot obl wb]
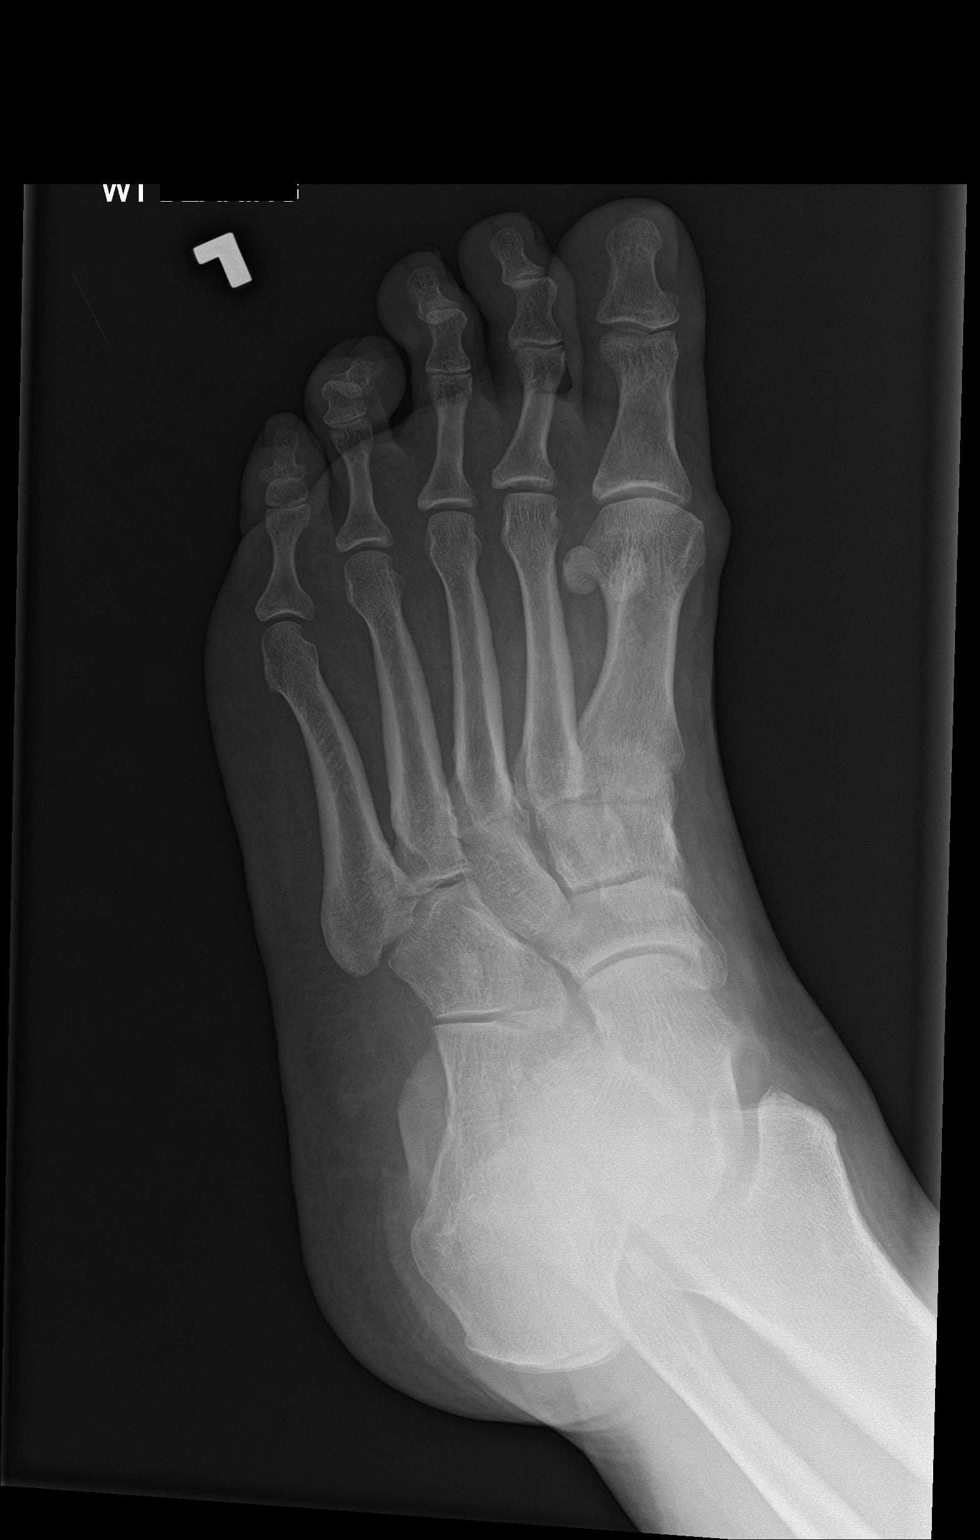

[foot lat wb]
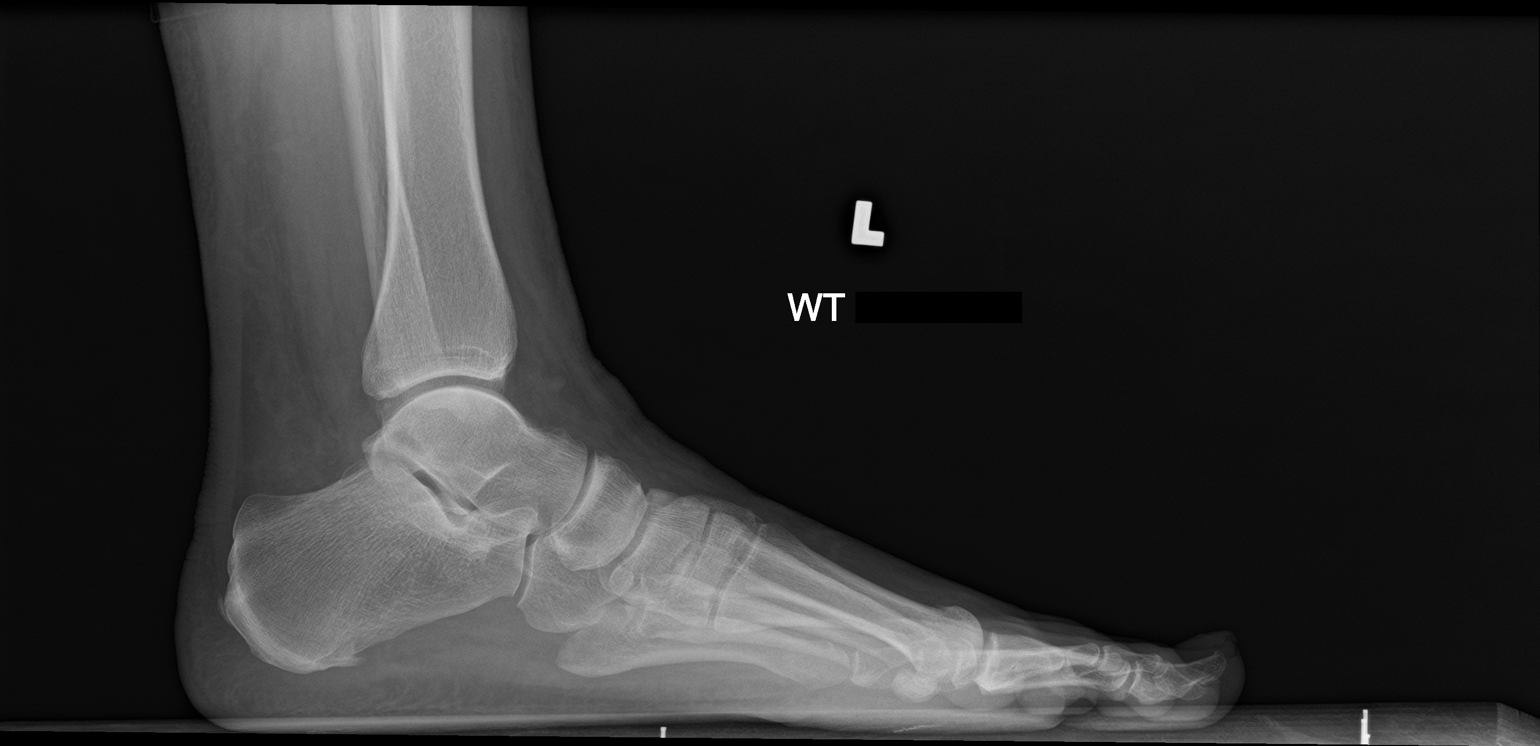

[3 of 3 positions shown; findings below may reference images not displayed]

FINDINGS: There is no evidence of fracture or dislocation. There is no
evidence of arthropathy. There are small plantar and posterior
calcaneal enthesopathic spurs without erosive changes or other focal
bone abnormality. Soft tissues are unremarkable.
IMPRESSION: Small plantar and posterior calcaneal spurs. No other significant
findings.

## 2021-05-11 NOTE — Progress Notes (Signed)
°  Subjective:  Patient ID: Rebecca [name], female    DOB: 1970-07-26,   MRN: [id number]  Chief Complaint  Patient presents with   Foot Pain    Pt reports L heel pain.     51 y.o. female presents for left heel pain that has been going on for about a year. Relates she has seen a podiatrist in the past and has had an injection that helped  for about 5 days. Here today to discuss other options. She has breast cancer and has been worried about that and relates she has neglected her heel pain . Denies any other pedal complaints. Denies n/v/f/c.   Past Medical History:  Diagnosis Date   Allergy    constant hives post chemo therapy.  All allergen tests have been neg.   Anxiety    Breast cancer ([place])    Coronary artery disease    Diabetes mellitus without complication ([place])    Diverticulitis    GERD (gastroesophageal reflux disease)    Hiatal hernia    Hyperlipidemia    Hypertension    Hypothyroidism    LBBB (left bundle branch block)    Pneumonia     Objective:  Physical Exam: Vascular: DP/PT pulses 2/4 bilateral. CFT <3 seconds. Normal hair growth on digits. No edema.  Skin. No lacerations or abrasions bilateral feet.  Musculoskeletal: MMT 5/5 bilateral lower extremities in DF, PF, Inversion and Eversion. Deceased ROM in DF of ankle joint. Tender to medial calcaneal tubercle on the left. No pain with medial calcaneal squeeze. No pain along achilles of PT tendon.  Neurological: Sensation intact to light touch.   Assessment:   1. Plantar fasciitis of left foot      Plan:  Patient was evaluated and treated and all questions answered. Discussed plantar fasciitis with patient.  X-rays reviewed and discussed with patient. No acute fractures or dislocations noted. Mild spurring noted at inferior calcaneus.  Discussed treatment options including, ice, NSAIDS, supportive shoes, bracing, and stretching. Stretching exercises provided to be done on a daily basis.   Will try a second  injection today.  Procedure note below.   Discussed inserts and supportive shoes. Continue with bracing.  Discussed if no improvement in next 6 weeks can try EPAT/MRI.  Follow-up 6 weeks or sooner if any problems arise. In the meantime, encouraged to call the office with any questions, concerns, change in symptoms.   Procedure:  Discussed etiology, pathology, conservative vs. surgical therapies. At this time a plantar fascial injection was recommended.  The patient agreed and a sterile skin prep was applied.  An injection consisting of  dexamethasone and marcaine mixture was infiltrated at the point of maximal tenderness on the left Heel.  Bandaid applied. The patient tolerated this well and was given instructions for aftercare.      Lorenda Peck, DPM

## 2021-05-11 NOTE — Patient Instructions (Signed)

## 2021-05-21 ENCOUNTER — Encounter: Payer: Self-pay | Admitting: Internal Medicine

## 2021-05-25 ENCOUNTER — Other Ambulatory Visit: Payer: Self-pay

## 2021-05-25 ENCOUNTER — Telehealth (INDEPENDENT_AMBULATORY_CARE_PROVIDER_SITE_OTHER): Payer: No Typology Code available for payment source | Admitting: Internal Medicine

## 2021-05-25 ENCOUNTER — Other Ambulatory Visit: Payer: No Typology Code available for payment source

## 2021-05-25 ENCOUNTER — Encounter: Payer: Self-pay | Admitting: Internal Medicine

## 2021-05-25 DIAGNOSIS — J0100 Acute maxillary sinusitis, unspecified: Secondary | ICD-10-CM | POA: Diagnosis not present

## 2021-05-25 DIAGNOSIS — I427 Cardiomyopathy due to drug and external agent: Secondary | ICD-10-CM

## 2021-05-25 DIAGNOSIS — E039 Hypothyroidism, unspecified: Secondary | ICD-10-CM

## 2021-05-25 DIAGNOSIS — E1169 Type 2 diabetes mellitus with other specified complication: Secondary | ICD-10-CM | POA: Diagnosis not present

## 2021-05-25 DIAGNOSIS — Z853 Personal history of malignant neoplasm of breast: Secondary | ICD-10-CM

## 2021-05-25 DIAGNOSIS — E669 Obesity, unspecified: Secondary | ICD-10-CM

## 2021-05-25 MED ORDER — AZITHROMYCIN 250 MG PO TABS: ORAL_TABLET | ORAL | 0 refills | Status: AC

## 2021-05-25 MED ORDER — HYDROCOD POLI-CHLORPHE POLI ER 10-8 MG/5ML PO SUER: 5.0000 mL | Freq: Two times a day (BID) | ORAL | 0 refills | Status: DC | PRN

## 2021-05-25 NOTE — Telephone Encounter (Signed)
Scheduled video visit 

## 2021-05-25 NOTE — Progress Notes (Signed)
° °  Subjective:    Patient ID: Rebecca [name], female    DOB: 1970/07/11, 51 y.o.   MRN: [id number]  HPI 51 year old Female seen today via interactive audio and video telecommunications for an acute respiratory infection. Had sore throat yesterday but today has developed bilateral maxillary sinus pressure and nasal congestion. Sore throat has gone away. Has green nasal discharge that is thick. SOB with minimal exertion.  She is agreeable to visit in this format today.  She is identified using 2 identifiers as Rebecca [name], a patient in this practice.  She is at her home and I am at my office.  Patient last seen in-person in August 2022. Hx of Covid-19 requiring hospitalization August 2021.  Was discharged home on home oxygen which she used for several weeks. Records indicate 2 Covid vaccines in 2021 and no recent flu vaccine. 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 Daughter has her driving permit now.  History of breast cancer in remission and followed by Dr. Lindi Adie.  History of right breast cancer for which she underwent chemotherapy including Adriamycin and Cytoxan.  She had bilateral mastectomy with reconstruction surgery in 2019.  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  History of moderate to severe left ventricular systolic dysfunction of 30 to 35%.  History of chronic systolic heart failure followed by Dr. Einar Gip.  History of type 2 diabetes mellitus.  History of radioactive iodine therapy in 2018 for hyperthyroidism.  History of urticaria.    Review of Systems no nausea vomiting fever or chills     Objective:   Physical Exam  Patient did not obtain VS prior to visit.  Patient seen virtually and appears to be tachypneic.  Initially was seen getting out of her car and then at her home. SOB with minimal exertion. No audible wheezing. No sputum production. Has thick maxillary sinus drainage she says that is  discolored. No sore throat today but had one yesterday.       Assessment & Plan:  Acute maxillary sinusitis  Shortness of breath-history of reactive airways treated with Atrovent and Symbicort after COVID-19 infection in 2021.  She should use these inhalers at the present time.  She also has been prescribed Singulair 10 mg daily which she should be taking.  History of right breast cancer followed by Dr. Lindi Adie  History of cardiomyopathy secondary to chemotherapy  History of COVID-16 December 2019  BMI 34  Type 2 diabetes mellitus-currently on Saxenda.  Hemoglobin A1c in August 2022 was excellent at 6.0%  Hypertriglyceridemia treated with statin  History of hyperthyroidism status post radioactive iodine therapy in 2018 now on thyroid replacement medication  Time spent with this visit is 20 minutes

## 2021-06-05 ENCOUNTER — Encounter: Payer: Self-pay | Admitting: Hematology and Oncology

## 2021-06-05 ENCOUNTER — Telehealth: Payer: Self-pay | Admitting: Podiatry

## 2021-06-05 ENCOUNTER — Other Ambulatory Visit: Payer: Self-pay | Admitting: Podiatry

## 2021-06-05 DIAGNOSIS — M722 Plantar fascial fibromatosis: Secondary | ICD-10-CM

## 2021-06-05 NOTE — Telephone Encounter (Signed)
Patient stated that the injection that she received on lasted a few days. She is still in pain and would like a referral for a MRI

## 2021-06-14 ENCOUNTER — Telehealth: Payer: Self-pay | Admitting: *Deleted

## 2021-06-14 NOTE — Telephone Encounter (Signed)
Thank you :)

## 2021-06-14 NOTE — Telephone Encounter (Signed)
Patient is calling for the status of MRI @[facility]  in [place]. Called and an prior Aut. Is required before scheduling. [employer] and spoke w/ Kathreen Cornfield , stated that no prior is required, ref # [id number]. Called [facility] and gave information and they will call patient to schedule. Patient notified.

## 2021-06-18 ENCOUNTER — Other Ambulatory Visit: Payer: Self-pay

## 2021-06-18 ENCOUNTER — Ambulatory Visit (INDEPENDENT_AMBULATORY_CARE_PROVIDER_SITE_OTHER): Payer: No Typology Code available for payment source

## 2021-06-18 DIAGNOSIS — M722 Plantar fascial fibromatosis: Secondary | ICD-10-CM | POA: Diagnosis not present

## 2021-06-18 IMAGING — MR MR HEEL *L* W/O CM
4 of 5 series · 30 of 40 positions shown · non-contrast
Comparison: Left foot radiographs [DATE]

CLINICAL DATA: Heel pain, chronic. Plantar fasciitis surgical
planning.

EXAM:
MR OF THE LEFT HEEL WITHOUT CONTRAST
TECHNIQUE: Multiplanar, multisequence MR imaging of the left hindfoot was
performed. No intravenous contrast was administered.

[Series 3: PD fat-sat · axial · 3.0mm · 0.62mm/px · z∈[-113,+15]mm · 8 of 40 slices shown]
[im 1/40]
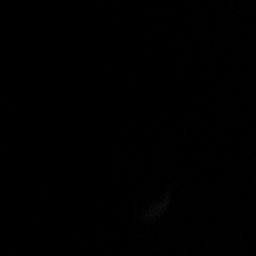
[im 5/40]
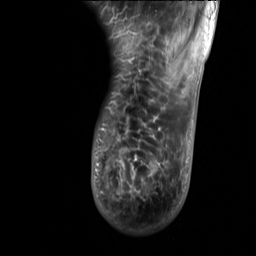
[im 14/40]
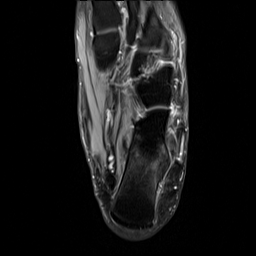
[im 18/40]
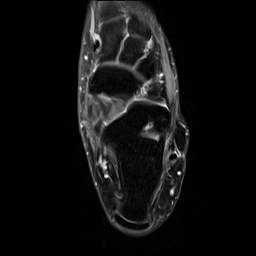
[im 22/40]
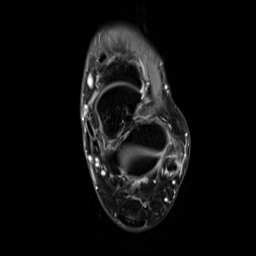
[im 27/40]
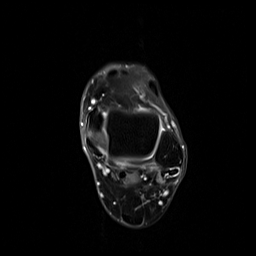
[im 35/40]
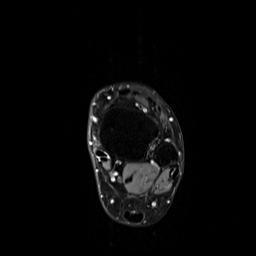
[im 40/40]
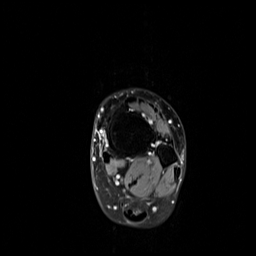

[Series 4: T2 fat-sat · axial · 3.0mm · 0.62mm/px · z∈[-113,+15]mm · 9 of 39 slices shown (1 of 2)]
[im 1/39]
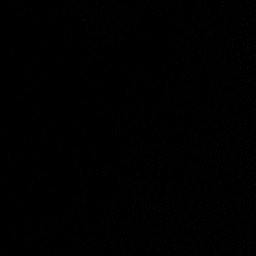
[im 5/39]
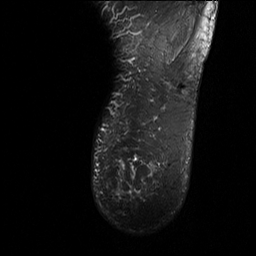
[im 10/39]
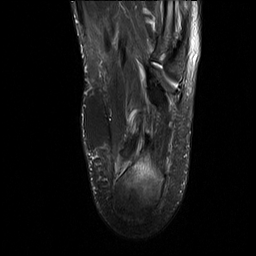
[im 15/39]
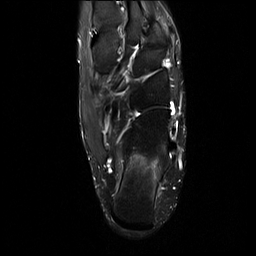
[im 20/39]
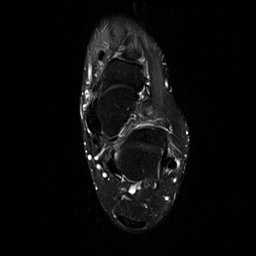
[im 24/39]
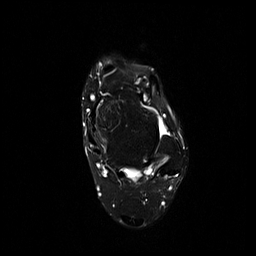
[im 29/39]
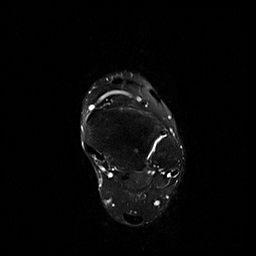
[im 34/39]
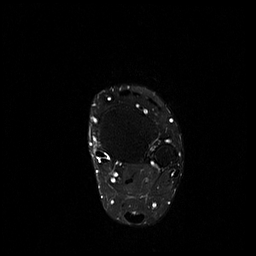
[im 39/39]
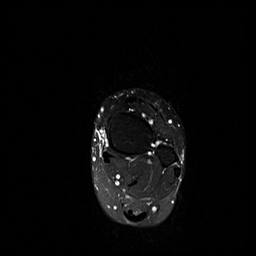

[Series 5: T1 · sagittal · 3.0mm · 0.31mm/px · 4 of 26 slices shown]
[im 1/26]
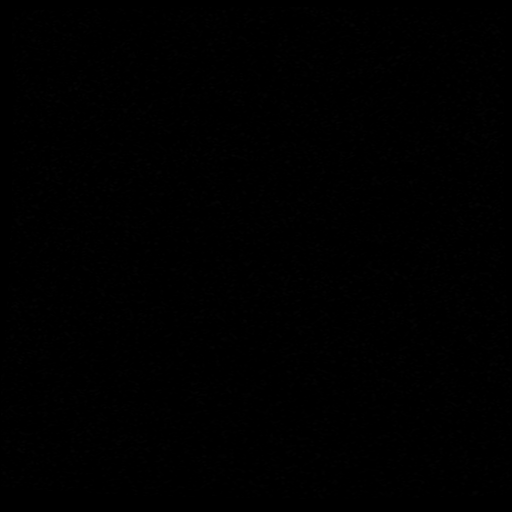
[im 6/26]
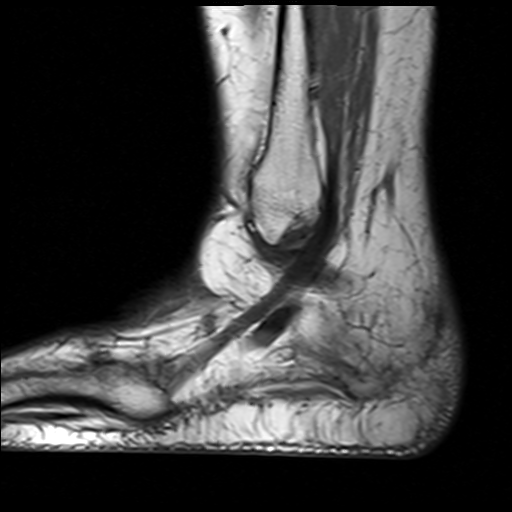
[im 16/26]
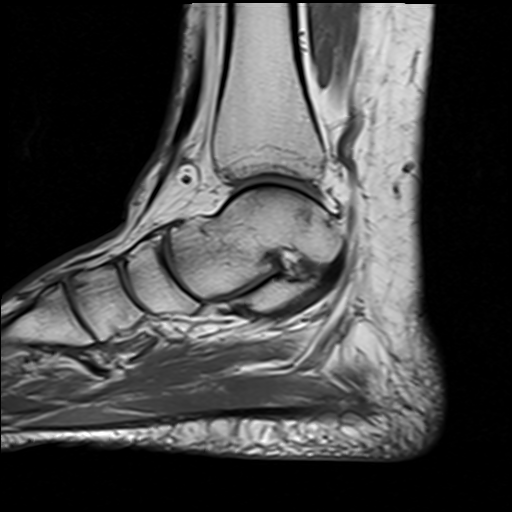
[im 26/26]
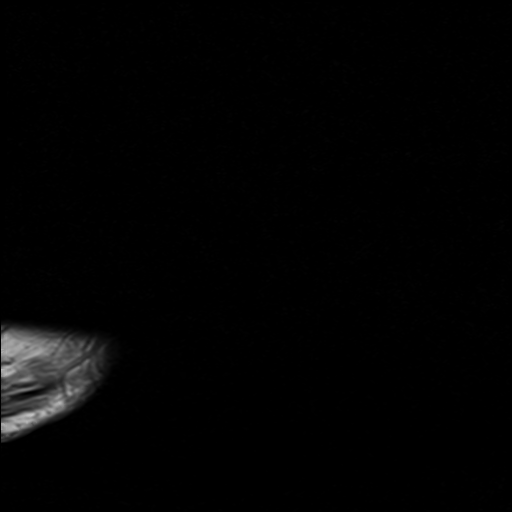

[Series 7: T2 fat-sat · coronal · 3.0mm · 0.31mm/px · 9 of 36 slices shown (2 of 2)]
[im 1/36]
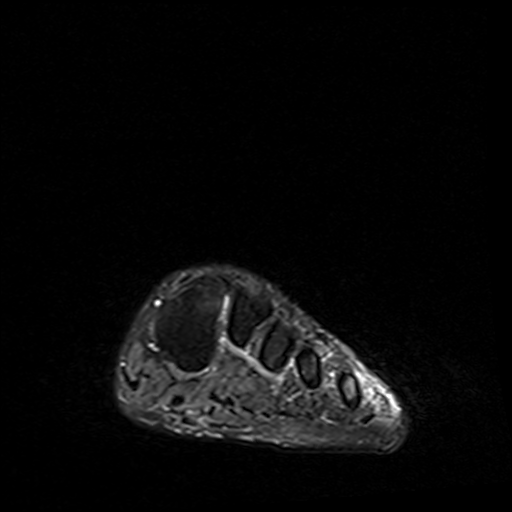
[im 5/36]
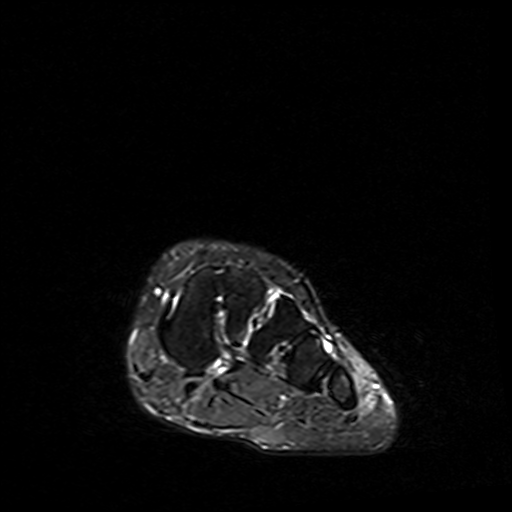
[im 9/36]
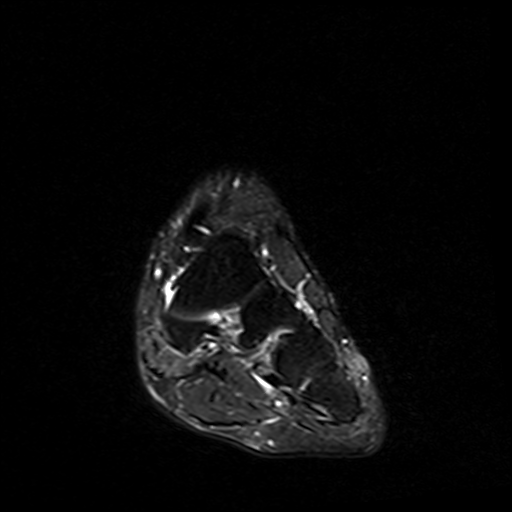
[im 14/36]
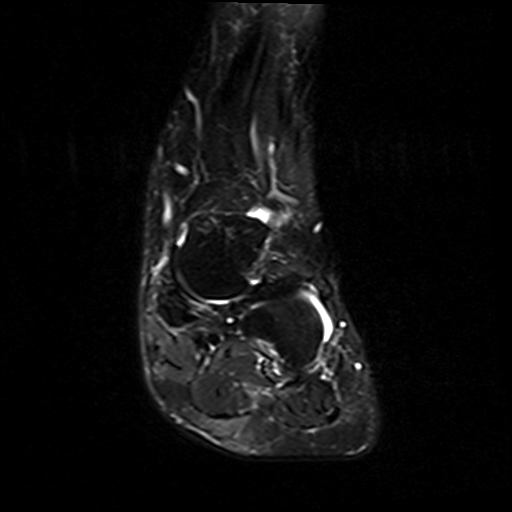
[im 18/36]
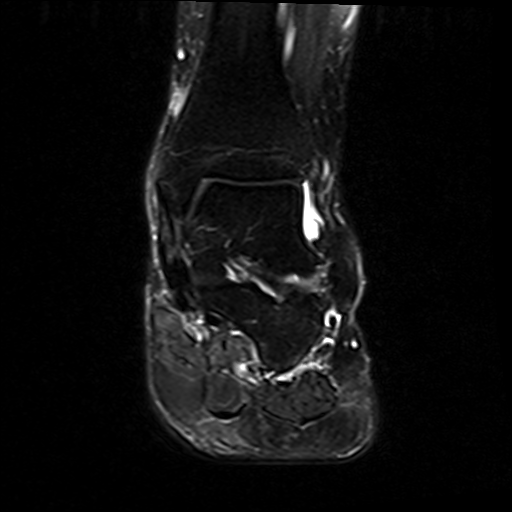
[im 22/36]
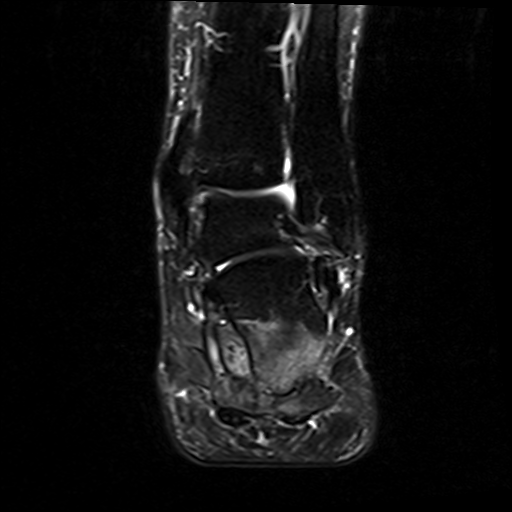
[im 27/36]
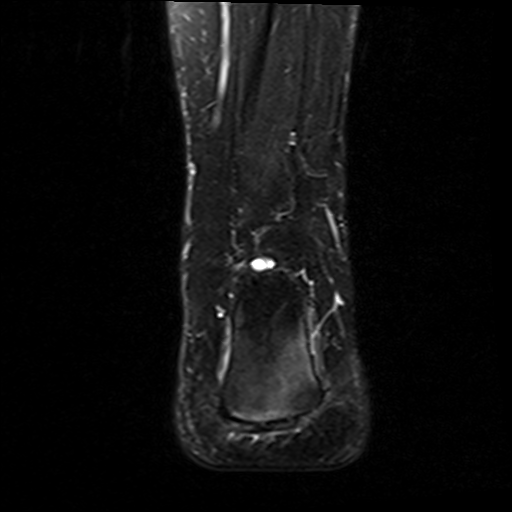
[im 31/36]
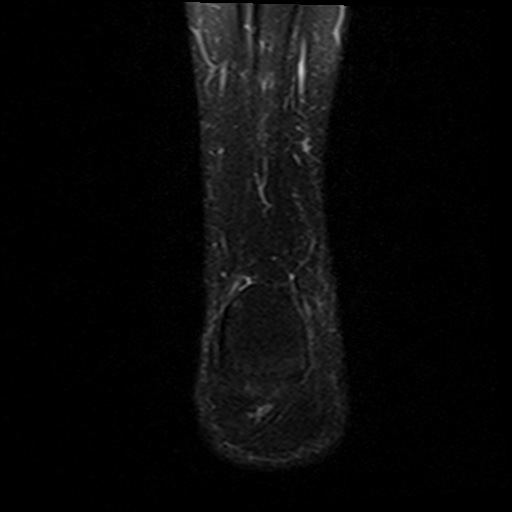
[im 36/36]
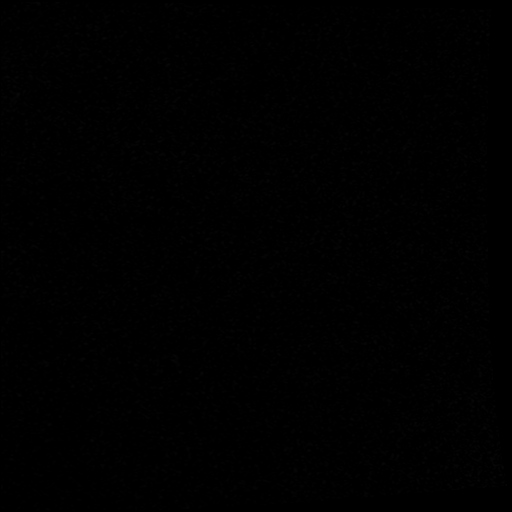

[30 of 40 positions shown; findings below may reference images not displayed]

FINDINGS: TENDONS

Peroneal: Mild peroneus longus and brevis tenosynovitis. The tendons
appear intact.

Posteromedial: Intact tibialis posterior, flexor digitorum longus,
and flexor hallucis longus tendons.

Anterior: Minimal distal tibialis anterior tenosynovitis. The
extensor hallucis longus and extensor digitorum longus tendons are
intact.

Achilles: Intact.

Plantar Fascia: There is a mild-to-moderate calcaneal heel spur with
moderate marrow edema extending through the adjacent lateral greater
than medial aspect of the posteroinferior calcaneus and adjacent
anterior process of the calcaneus. There are at least 2 areas of
minimal fluid bright signal at the plantar fascia origin measuring
up to mm in AP dimension (sagittal images 15 through 17) and 1 mm in
craniocaudal dimension. Largest small fluid focus measures up to 5
mm in transverse dimension (coronal series 7, image 14). These
indicate tiny tears without plantar fascia retraction. There is also
moderate intermediate T2 signal and thickening of the origin of the
medial band of the plantar fascia (coronal image 12).

LIGAMENTS

Lateral: The anterior and posterior talofibular, anterior and
posterior tibiofibular, and calcaneofibular ligaments are intact.

Medial: The tibiotalar deep deltoid and tibial spring ligaments are
intact.

CARTILAGE

Ankle Joint: The talar dome cartilage is intact.

Subtalar Joints/Sinus Tarsi: Fat is preserved within sinus tarsi.

Bones: No acute fracture.

Other: The Lisfranc ligament complex is intact.
IMPRESSION: :
IMPRESSION: 1. Multiple small partial-thickness tears of the plantar fascia
origin with moderate medial band of the plantar fascia tendinosis.
Moderate marrow edema within the adjacent posteroinferior aspect of
the calcaneus.
2. Mild peroneus longus and brevis tenosynovitis.

## 2021-06-22 ENCOUNTER — Other Ambulatory Visit: Payer: Self-pay

## 2021-06-22 ENCOUNTER — Ambulatory Visit (INDEPENDENT_AMBULATORY_CARE_PROVIDER_SITE_OTHER): Payer: No Typology Code available for payment source | Admitting: Podiatry

## 2021-06-22 ENCOUNTER — Encounter: Payer: Self-pay | Admitting: Podiatry

## 2021-06-22 DIAGNOSIS — M722 Plantar fascial fibromatosis: Secondary | ICD-10-CM

## 2021-06-22 NOTE — Progress Notes (Signed)
°  Subjective:  Patient ID: Rebecca [name], female    DOB: 1970-08-21,   MRN: [id number]  Chief Complaint  Patient presents with   Follow-up    MRI follow up- pt reports there is no improvement with the pain- injection did not helped- further evaluation     51 y.o. female presents follow-up of left heel pain. Relates the injection did not help and is here to review MRI results. States she is having a lot of pain and would like some relief today. . Denies any other pedal complaints. Denies n/v/f/c.   Past Medical History:  Diagnosis Date   Allergy    constant hives post chemo therapy.  All allergen tests have been neg.   Anxiety    Breast cancer ([place])    Coronary artery disease    Diabetes mellitus without complication ([place])    Diverticulitis    GERD (gastroesophageal reflux disease)    Hiatal hernia    Hyperlipidemia    Hypertension    Hypothyroidism    LBBB (left bundle branch block)    Pneumonia     Objective:  Physical Exam: Vascular: DP/PT pulses 2/4 bilateral. CFT <3 seconds. Normal hair growth on digits. No edema.  Skin. No lacerations or abrasions bilateral feet.  Musculoskeletal: MMT 5/5 bilateral lower extremities in DF, PF, Inversion and Eversion. Deceased ROM in DF of ankle joint. Tender to medial calcaneal tubercle on the left. No pain with medial calcaneal squeeze. No pain along achilles of PT tendon.  Neurological: Sensation intact to light touch.   Assessment:   1. Plantar fasciitis of left foot      Plan:  Patient was evaluated and treated and all questions answered. Discussed plantar fasciitis with patient.  X-rays reviewed and discussed with patient. No acute fractures or dislocations noted. Mild spurring noted at inferior calcaneus.  Discussed treatment options including, ice, NSAIDS, supportive shoes, bracing, and stretching.  Continue stretching  Will try a third injection today.  Procedure note below.   MRI reviewed which showed plantar  fascitis and some peroneal tenosynovitis.  Discussed inserts and supportive shoes. Continue with bracing.  Will schedule for EPAT to help with PF pain.  Follow-up 6 weeks or sooner if any problems arise. In the meantime, encouraged to call the office with any questions, concerns, change in symptoms.   Procedure:  Discussed etiology, pathology, conservative vs. surgical therapies. At this time a plantar fascial injection was recommended.  The patient agreed and a sterile skin prep was applied.  An injection consisting of  dexamethasone and marcaine mixture was infiltrated at the point of maximal tenderness on the left Heel.  Bandaid applied. The patient tolerated this well and was given instructions for aftercare.      Lorenda Peck, DPM

## 2021-07-05 ENCOUNTER — Other Ambulatory Visit: Payer: Self-pay | Admitting: Cardiology

## 2021-07-05 ENCOUNTER — Other Ambulatory Visit: Payer: Self-pay | Admitting: Internal Medicine

## 2021-07-05 DIAGNOSIS — I5022 Chronic systolic (congestive) heart failure: Secondary | ICD-10-CM

## 2021-07-05 DIAGNOSIS — E1169 Type 2 diabetes mellitus with other specified complication: Secondary | ICD-10-CM

## 2021-07-05 DIAGNOSIS — E039 Hypothyroidism, unspecified: Secondary | ICD-10-CM

## 2021-07-06 ENCOUNTER — Other Ambulatory Visit: Payer: Self-pay | Admitting: Cardiology

## 2021-07-06 DIAGNOSIS — I5022 Chronic systolic (congestive) heart failure: Secondary | ICD-10-CM

## 2021-07-10 ENCOUNTER — Ambulatory Visit: Payer: No Typology Code available for payment source

## 2021-07-12 ENCOUNTER — Encounter: Payer: Self-pay | Admitting: Internal Medicine

## 2021-08-02 ENCOUNTER — Encounter: Payer: Self-pay | Admitting: Cardiology

## 2021-08-02 DIAGNOSIS — I10 Essential (primary) hypertension: Secondary | ICD-10-CM

## 2021-08-02 DIAGNOSIS — I427 Cardiomyopathy due to drug and external agent: Secondary | ICD-10-CM

## 2021-08-02 DIAGNOSIS — E782 Mixed hyperlipidemia: Secondary | ICD-10-CM

## 2021-08-02 DIAGNOSIS — E119 Type 2 diabetes mellitus without complications: Secondary | ICD-10-CM

## 2021-08-02 NOTE — Telephone Encounter (Signed)
From patient.

## 2021-08-02 NOTE — Telephone Encounter (Signed)
ICD-10-CM   ?1. Type 2 diabetes mellitus without complication, without long-term current use of insulin (HCC)  E11.9 Hgb A1c w/o eAG  ?  ?2. 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  ?3. Essential hypertension  I10 CBC  ?  TSH  ?  COMPLETE METABOLIC PANEL WITH GFR  ?  ?4. Mixed hyperlipidemia  E78.2 Lipid Panel With LDL/HDL Ratio  ?  ? ? ?Adrian Prows, MD, [facility] ?08/02/2021, 5:59 PM ?Office: [phone] ?Fax: [phone] ?Pager: [phone]  ?

## 2021-08-03 ENCOUNTER — Other Ambulatory Visit: Payer: Self-pay | Admitting: Internal Medicine

## 2021-08-03 DIAGNOSIS — E039 Hypothyroidism, unspecified: Secondary | ICD-10-CM

## 2021-08-06 ENCOUNTER — Other Ambulatory Visit: Payer: Self-pay | Admitting: Internal Medicine

## 2021-08-21 ENCOUNTER — Telehealth: Payer: Self-pay | Admitting: Podiatry

## 2021-08-21 NOTE — Telephone Encounter (Signed)
Left message for pt to call to schedule an appt for her epat procedure if she is still interested... ?

## 2021-08-24 ENCOUNTER — Telehealth: Payer: Self-pay

## 2021-08-24 LAB — CBC
Hematocrit: 42.2 % (ref 34.0–46.6)
Hemoglobin: 13.8 g/dL (ref 11.1–15.9)
MCH: 26.4 pg — ABNORMAL LOW (ref 26.6–33.0)
MCHC: 32.7 g/dL (ref 31.5–35.7)
MCV: 81 fL (ref 79–97)
Platelets: 264 10*3/uL (ref 150–450)
RBC: 5.22 x10E6/uL (ref 3.77–5.28)
RDW: 15.2 % (ref 11.7–15.4)
WBC: 7.4 10*3/uL (ref 3.4–10.8)

## 2021-08-24 LAB — LIPID PANEL WITH LDL/HDL RATIO
Cholesterol, Total: 173 mg/dL (ref 100–199)
HDL: 49 mg/dL (ref >39)
LDL Chol Calc (NIH): 93 mg/dL (ref 0–99)
LDL/HDL Ratio: 1.9 ratio (ref 0.0–3.2)
Triglycerides: 181 mg/dL — ABNORMAL HIGH (ref 0–149)
VLDL Cholesterol Cal: 31 mg/dL (ref 5–40)

## 2021-08-24 LAB — TSH: TSH: 4.85 u[IU]/mL — ABNORMAL HIGH (ref 0.450–4.500)

## 2021-08-24 LAB — HGB A1C W/O EAG: Hgb A1c MFr Bld: 6.7 % — ABNORMAL HIGH (ref 4.8–5.6)

## 2021-08-24 NOTE — Progress Notes (Signed)
Just FYI. Labs appear stable and normal with minor abnormality. TSH mildly elevated

## 2021-08-24 NOTE — Telephone Encounter (Signed)
Patient would like to have an appointment to discuss her cardiology labs. She is asking for the week of the o8thof May since she does have a FU with cardio on the 1st.  ?

## 2021-08-27 ENCOUNTER — Ambulatory Visit: Payer: No Typology Code available for payment source

## 2021-08-27 ENCOUNTER — Ambulatory Visit: Payer: No Typology Code available for payment source | Admitting: Cardiology

## 2021-08-27 ENCOUNTER — Inpatient Hospital Stay: Payer: No Typology Code available for payment source

## 2021-08-27 ENCOUNTER — Encounter: Payer: Self-pay | Admitting: Cardiology

## 2021-08-27 VITALS — BP 127/89 | HR 82 | Temp 97.3°F | Resp 16 | Ht 70.0 in | Wt 239.0 lb

## 2021-08-27 DIAGNOSIS — I427 Cardiomyopathy due to drug and external agent: Secondary | ICD-10-CM

## 2021-08-27 DIAGNOSIS — I447 Left bundle-branch block, unspecified: Secondary | ICD-10-CM

## 2021-08-27 DIAGNOSIS — I10 Essential (primary) hypertension: Secondary | ICD-10-CM

## 2021-08-27 DIAGNOSIS — I493 Ventricular premature depolarization: Secondary | ICD-10-CM

## 2021-08-27 DIAGNOSIS — I5022 Chronic systolic (congestive) heart failure: Secondary | ICD-10-CM

## 2021-08-27 DIAGNOSIS — E1169 Type 2 diabetes mellitus with other specified complication: Secondary | ICD-10-CM

## 2021-08-27 MED ORDER — TIRZEPATIDE 15 MG/0.5ML ~~LOC~~ SOAJ: 15.0000 mg | SUBCUTANEOUS | 6 refills | Status: DC

## 2021-08-27 MED ORDER — MOUNJARO 5 MG/0.5ML ~~LOC~~ SOAJ: 5.0000 mg | SUBCUTANEOUS | 0 refills | Status: AC

## 2021-08-27 MED ORDER — ENTRESTO 49-51 MG PO TABS: 1.0000 | ORAL_TABLET | Freq: Two times a day (BID) | ORAL | 0 refills | Status: DC

## 2021-08-27 MED ORDER — MOUNJARO 10 MG/0.5ML ~~LOC~~ SOAJ: 10.0000 mg | SUBCUTANEOUS | 0 refills | Status: AC

## 2021-08-27 NOTE — Telephone Encounter (Signed)
Left message for patient to return call to office at [phone].  

## 2021-08-27 NOTE — Progress Notes (Signed)
? ?Primary Physician/Referring:  Elby Showers, MD ? ?Patient ID: Rebecca [name], female    DOB: April 10, 1971, 51 y.o.   MRN: [id number] ? ?Chief Complaint  ?Patient presents with  ? Congestive Heart Failure  ? Follow-up  ?  1 year  ? Results  ? ?HPI:   ? ?Rebecca [name]  is a 51 y.o. Caucasian female initially with history of LBBB at least dating back to 2013.  At that time she has had a negative nuclear stress test and echocardiogram revealed low normal LVEF at 45-50%.  ? ?Past medical history includes hyperthyroidism S/P RAI therapy in March 2018, depression, chronic low back pain, estrogen negative right breast cancer for which he underwent chemotherapy(Adjuvant chemotherapy with dose dense Adriamycin and Cytoxan x4)  and also bilateral mastectomy with reconstruction surgery on 11/18/2017. Surveillance echocardiogram due to chemotherapy attributed gradual decrease of LVEF to moderate to severe LV systolic dysfunction of 30 to 35%. 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    ? ?She now presents for follow-up, underwent echocardiogram and also lab testing.  States that she is doing well and denies any chest pain or dyspnea, dizziness or syncope or leg edema.  No PND or orthopnea. ? ?Past Medical History:  ?Diagnosis Date  ? Allergy   ? constant hives post chemo therapy.  All allergen tests have been neg.  ? Anxiety   ? Breast cancer ([place])   ? Coronary artery disease   ? Diabetes mellitus without complication ([place])   ? Diverticulitis   ? GERD (gastroesophageal reflux disease)   ? Hiatal hernia   ? Hyperlipidemia   ? Hypertension   ? Hypothyroidism   ? LBBB (left bundle branch block)   ? Pneumonia   ? ? ?Social History  ? ?Tobacco Use  ? Smoking status: Former  ?  Packs/day: 0.25  ?  Years: 6.00  ?  Pack years: 1.50  ?  Types: Cigarettes  ?  Quit date: 03/29/2005  ?  Years since quitting: 16.4  ? Smokeless tobacco: Never  ?Substance Use Topics  ? Alcohol use: Yes  ?  Comment: socially   ?Marital Status: Married   ? ?ROS  ?Review of Systems  ?Cardiovascular:  Negative for chest pain, dyspnea on exertion and leg swelling.  ?Objective  ? ? ?  08/27/2021  ?  1:59 PM 01/08/2021  ?  2:50 PM 12/18/2020  ? 11:52 AM  ?Vitals with BMI  ?Height '5\' 10"'$   '5\' 10"'$   ?Weight 239 lbs 236 lbs 8 oz 240 lbs  ?BMI 34.29 33.93 34.44  ?Systolic 599 357 017  ?Diastolic 89 84 70  ?Pulse 82 87 85  ?  ?Blood pressure 127/89, pulse 82, temperature (!) 97.3 ?F (36.3 ?C), resp. rate 16, height '5\' 10"'$  (1.778 m), weight 239 lb (108.4 kg), SpO2 97 %. Body mass index is 34.29 kg/m?.  ? ?  08/27/2021  ?  1:59 PM 01/08/2021  ?  2:50 PM 12/18/2020  ? 11:52 AM  ?Vitals with BMI  ?Height '5\' 10"'$   '5\' 10"'$   ?Weight 239 lbs 236 lbs 8 oz 240 lbs  ?BMI 34.29 33.93 34.44  ?Systolic 793 903 009  ?Diastolic 89 84 70  ?Pulse 82 87 85  ?   ?Physical Exam ?Constitutional:   ?   Appearance: She is obese.  ?Neck:  ?   Vascular: No JVD.  ?Cardiovascular:  ?   Rate and Rhythm: Normal rate and regular rhythm.  ?  Pulses: Intact distal pulses.  ?   Heart sounds: Normal heart sounds. No murmur heard. ?  No gallop.  ?Pulmonary:  ?   Effort: Pulmonary effort is normal.  ?   Breath sounds: Normal breath sounds.  ?Abdominal:  ?   General: Bowel sounds are normal.  ?   Palpations: Abdomen is soft.  ?Musculoskeletal:  ?   Right lower leg: No edema.  ?   Left lower leg: No edema.  ? ?Radiology: ?Laboratory examination:  ? ?Recent Labs  ?  01/08/21 ?1436  ?NA 141  ?K 3.9  ?CL 98  ?CO2 32  ?GLUCOSE 97  ?BUN 19  ?CREATININE 1.12*  ?CALCIUM 9.8  ?GFRNONAA 60*  ? ? ?  Latest Ref Rng & Units 01/08/2021  ?  2:36 PM 10/27/2020  ?  3:03 PM 05/11/2020  ?  8:16 AM  ?CMP  ?Glucose 70 - 99 mg/dL 97    98    ?BUN 6 - 20 mg/dL 19    16    ?Creatinine 0.44 - 1.00 mg/dL 1.12    0.90    ?Sodium 135 - 145 mmol/L 141    140    ?Potassium 3.5 - 5.1 mmol/L 3.9    4.6    ?Chloride 98 - 111 mmol/L 98    99    ?CO2 22 - 32 mmol/L 32    25    ?Calcium 8.9 - 10.3 mg/dL 9.8    9.5    ?Total Protein 6.5  - 8.1 g/dL 7.4   6.7   6.4    ?Total Bilirubin 0.3 - 1.2 mg/dL 0.3   0.3   0.3    ?Alkaline Phos 38 - 126 U/L 75   64   68    ?AST 15 - 41 U/L '20   22   14    '$ ?ALT 0 - 44 U/L '21   24   16    '$ ? ? ?  Latest Ref Rng & Units 08/23/2021  ?  8:05 AM 01/08/2021  ?  2:36 PM 05/11/2020  ?  8:14 AM  ?CBC  ?WBC 3.4 - 10.8 x10E3/uL 7.4   11.2   6.0    ?Hemoglobin 11.1 - 15.9 g/dL 13.8   14.0   13.3    ?Hematocrit 34.0 - 46.6 % 42.2   44.8   39.8    ?Platelets 150 - 450 x10E3/uL 264   310   261    ? ?Lipid Panel ?Recent Labs  ?  09/13/20 ?1549 08/23/21 ?0805  ?CHOL 186 173  ?TRIG 440* 181*  ?[place] 75 93  ?HDL 41 49  ?  ? ?HEMOGLOBIN A1C ?Lab Results  ?Component Value Date  ? HGBA1C 6.7 (H) 08/23/2021  ? MPG 126 12/18/2020  ? ?TSH ?Recent Labs  ?  12/18/20 ?1219 08/23/21 ?0805  ?TSH 2.79 4.850*  ? ?Medications  ? ?Current Outpatient Medications:  ?  Ascorbic Acid (VITAMIN C GUMMIE PO), Take 2 tablets by mouth daily., Disp: , Rfl:  ?  BD INSULIN SYRINGE U/F 31G X 5/16" 0.5 ML MISC, USE AS DIRECTED, Disp: 300 each, Rfl: 3 ?  carvedilol (COREG) 12.5 MG tablet, TAKE 1 TABLET BY MOUTH 2 TIMES DAILY., Disp: 180 tablet, Rfl: 3 ?  cetirizine (ZYRTEC) 10 MG tablet, Take 10 mg by mouth daily., Disp: , Rfl:  ?  EPINEPHrine 0.3 mg/0.3 mL IJ SOAJ injection, Use as directed for life threatening allergic reactions only (Patient taking differently: Inject 0.3 mg into  the muscle as needed for anaphylaxis. Use as directed for life threatening allergic reactions only), Disp: 2 each, Rfl: 3 ?  esomeprazole (NEXIUM) 40 MG capsule, TAKE 1 CAPSULE (40 MG TOTAL) BY MOUTH DAILY AT 12 NOON., Disp: 90 capsule, Rfl: 2 ?  furosemide (LASIX) 40 MG tablet, TAKE 1 TABLET EVERY DAY AS NEEDED FOR FLUID *SHORTNESS OF BREATH*, Disp: 30 tablet, Rfl: 1 ?  Insulin Pen Needle 32G X 4 MM MISC, 1 each by Does not apply route daily., Disp: 100 each, Rfl: 3 ?  JARDIANCE 10 MG TABS tablet, TAKE 1 TABLET BY MOUTH EVERY DAY BEFORE BREAKFAST, Disp: 90 tablet, Rfl: 1 ?   levothyroxine (SYNTHROID) 112 MCG tablet, TAKE 1 TABLET BY MOUTH EVERY DAY. NEEDS AN APPOINTMENT FOR REFILLS, Disp: 30 tablet, Rfl: 0 ?  LORazepam (ATIVAN) 1 MG tablet, TAKE 1 TABLET BY MOUTH TWICE A DAY AS NEEDED, Disp: 60 tablet, Rfl: 3 ?  montelukast (SINGULAIR) 10 MG tablet, Take 1 tablet (10 mg total) by mouth at bedtime., Disp: 90 tablet, Rfl: 1 ?  Multiple Vitamin (MULTIVITAMIN PO), Take 1 tablet by mouth daily. Multivitamin + Omega-3, Disp: , Rfl:  ?  rosuvastatin (CRESTOR) 20 MG tablet, TAKE 1 TABLET BY MOUTH EVERY DAY, Disp: 90 tablet, Rfl: 3 ?  sacubitril-valsartan (ENTRESTO) 49-51 MG, Take 1 tablet by mouth 2 (two) times daily., Disp: 60 tablet, Rfl: 0 ?  [START ON 09/24/2021] tirzepatide (MOUNJARO) 10 MG/0.5ML Pen, Inject 10 mg into the skin once a week for 28 days., Disp: 2 mL, Rfl: 0 ?  [START ON 10/22/2021] tirzepatide (MOUNJARO) 15 MG/0.5ML Pen, Inject 15 mg into the skin once a week., Disp: 2 mL, Rfl: 6 ?  tirzepatide (MOUNJARO) 5 MG/0.5ML Pen, Inject 5 mg into the skin once a week for 28 days., Disp: 2 mL, Rfl: 0 ?  TRESIBA FLEXTOUCH 100 UNIT/ML FlexTouch Pen, Inject 10 Units into the skin daily., Disp: 9 mL, Rfl: 1 ?  venlafaxine XR (EFFEXOR-XR) 150 MG 24 hr capsule, TAKE 1 CAPSULE BY MOUTH DAILY WITH BREAKFAST., Disp: 90 capsule, Rfl: 0 ?  meloxicam (MOBIC) 15 MG tablet, Take 15 mg by mouth every morning., Disp: , Rfl:   ?  ?Cardiac Studies:  ? ?Coronary CTA 01/01/2019: ?1. Coronary artery calcium score 50 Agatston units. This places the patient in the 97th percentile for age and gender, suggesting high risk for future cardiac events. ?2.  Nonobstructive coronary disease. ?3. The RCA originates from the left cusp in close proximity to the left coronary and courses interarterially between the aortic root and proximal PA to reach typical RCA territory. Would consider treadmill stress testing to assess for provocative ischemia. ?4. LAD system: Mixed plaque in the proximal LAD with mild (<50%)  stenosis. Circumflex system: Large PLOM, small AV LCx after take-off of PLOM. No plaque or stenosis. ? ?Echocardiogram 08/27/2021:  ?Severely depressed LV systolic function with visual EF 25-30%. Left ventricle ca

## 2021-08-28 ENCOUNTER — Encounter: Payer: Self-pay | Admitting: Cardiology

## 2021-08-28 NOTE — Telephone Encounter (Signed)
scheduled

## 2021-09-01 ENCOUNTER — Other Ambulatory Visit: Payer: Self-pay | Admitting: Cardiology

## 2021-09-01 DIAGNOSIS — I5022 Chronic systolic (congestive) heart failure: Secondary | ICD-10-CM

## 2021-09-02 ENCOUNTER — Other Ambulatory Visit: Payer: Self-pay | Admitting: Internal Medicine

## 2021-09-02 DIAGNOSIS — E039 Hypothyroidism, unspecified: Secondary | ICD-10-CM

## 2021-09-04 ENCOUNTER — Encounter: Payer: Self-pay | Admitting: Internal Medicine

## 2021-09-04 ENCOUNTER — Encounter: Payer: Self-pay | Admitting: Cardiology

## 2021-09-04 ENCOUNTER — Ambulatory Visit (INDEPENDENT_AMBULATORY_CARE_PROVIDER_SITE_OTHER): Payer: No Typology Code available for payment source | Admitting: Internal Medicine

## 2021-09-04 VITALS — BP 118/72 | HR 85 | Temp 97.7°F | Ht 70.0 in | Wt 242.8 lb

## 2021-09-04 DIAGNOSIS — E039 Hypothyroidism, unspecified: Secondary | ICD-10-CM

## 2021-09-04 DIAGNOSIS — E1169 Type 2 diabetes mellitus with other specified complication: Secondary | ICD-10-CM

## 2021-09-04 DIAGNOSIS — E669 Obesity, unspecified: Secondary | ICD-10-CM

## 2021-09-04 NOTE — Progress Notes (Incomplete)
   Subjective:    Patient ID: Rebecca [name], female    DOB: [birth year], 51 y.o.   MRN: [id number]  HPI Here for follow up on several medical issues.  She sees Dr. Nadyne Coombes for nonobstructive coronary disease, cardiomyopathy with left ventricular ejection fraction 25 to 30%.  Was tried on Entresto but caused a severe rash.  This is being tried again at a low dose.  She is on carvedilol all.  History of breast cancer treated with Adriamycin and Cytoxan for estrogen negative right breast cancer.  Also underwent bilateral mastectomy with reconstruction in 2019.  History of frequent PVCs.  Extended monitoring recommended by Dr. Nicolette Bang.  She has history of diabetes mellitus.  She wants to try [facility].  History of mixed hyperlipidemia.  Dr. Einar Gip hopes Rebecca [name] and weight loss would help this.  He will see her again in 6 months.  History of hypothyroidism treated with thyroid replacement medication consisting of levothyroxine 0.112 mg daily.  TSH in April was 4.850 and is now 3.63.  I am increasing levothyroxine to 0.150 mg daily with follow-up in a couple of months.  Would like to see TSH daily at 1.00.  History of musculoskeletal pain treated with Mobic 15 mg daily.  Currently on Entresto 97-103 mg taking 1 tablet twice daily  Review of Systems generalized fatigue but overall doing fairly well.  No more issues with hives.     Objective:   Physical Exam         Assessment & Plan:

## 2021-09-05 ENCOUNTER — Encounter: Payer: Self-pay | Admitting: Hematology and Oncology

## 2021-09-05 LAB — COMPLETE METABOLIC PANEL WITH GFR
AG Ratio: 1.8 (calc) (ref 1.0–2.5)
ALT: 14 U/L (ref 6–29)
AST: 13 U/L (ref 10–35)
Albumin: 4.4 g/dL (ref 3.6–5.1)
Alkaline phosphatase (APISO): 60 U/L (ref 37–153)
BUN: 23 mg/dL (ref 7–25)
CO2: 23 mmol/L (ref 20–32)
Calcium: 9.4 mg/dL (ref 8.6–10.4)
Chloride: 104 mmol/L (ref 98–110)
Creat: 0.96 mg/dL (ref 0.50–1.03)
Globulin: 2.4 g/dL (calc) (ref 1.9–3.7)
Glucose, Bld: 91 mg/dL (ref 65–99)
Potassium: 5 mmol/L (ref 3.5–5.3)
Sodium: 141 mmol/L (ref 135–146)
Total Bilirubin: 0.3 mg/dL (ref 0.2–1.2)
Total Protein: 6.8 g/dL (ref 6.1–8.1)
eGFR: 72 mL/min/{1.73_m2} (ref >=60)

## 2021-09-05 LAB — TSH: TSH: 3.63 mIU/L

## 2021-09-05 NOTE — Telephone Encounter (Signed)
From patient.

## 2021-09-07 ENCOUNTER — Other Ambulatory Visit: Payer: Self-pay

## 2021-09-07 MED ORDER — LEVOTHYROXINE SODIUM 150 MCG PO TABS: 150.0000 ug | ORAL_TABLET | Freq: Every day | ORAL | 0 refills | Status: DC

## 2021-09-07 NOTE — Telephone Encounter (Signed)
From patient.

## 2021-09-11 ENCOUNTER — Other Ambulatory Visit: Payer: Self-pay

## 2021-09-11 ENCOUNTER — Encounter: Payer: Self-pay | Admitting: Cardiology

## 2021-09-11 DIAGNOSIS — I427 Cardiomyopathy due to drug and external agent: Secondary | ICD-10-CM

## 2021-09-11 DIAGNOSIS — I5022 Chronic systolic (congestive) heart failure: Secondary | ICD-10-CM

## 2021-09-11 MED ORDER — ENTRESTO 49-51 MG PO TABS: 1.0000 | ORAL_TABLET | Freq: Two times a day (BID) | ORAL | 3 refills | Status: DC

## 2021-09-12 ENCOUNTER — Other Ambulatory Visit: Payer: Self-pay | Admitting: Cardiology

## 2021-09-12 DIAGNOSIS — I5022 Chronic systolic (congestive) heart failure: Secondary | ICD-10-CM

## 2021-09-12 NOTE — Telephone Encounter (Signed)
From patient.

## 2021-09-13 ENCOUNTER — Other Ambulatory Visit: Payer: Self-pay

## 2021-09-13 DIAGNOSIS — I5022 Chronic systolic (congestive) heart failure: Secondary | ICD-10-CM

## 2021-09-13 DIAGNOSIS — I427 Cardiomyopathy due to drug and external agent: Secondary | ICD-10-CM

## 2021-09-13 MED ORDER — ENTRESTO 49-51 MG PO TABS: 1.0000 | ORAL_TABLET | Freq: Two times a day (BID) | ORAL | 3 refills | Status: DC

## 2021-09-14 ENCOUNTER — Other Ambulatory Visit: Payer: Self-pay | Admitting: Internal Medicine

## 2021-09-14 ENCOUNTER — Other Ambulatory Visit: Payer: Self-pay | Admitting: Cardiology

## 2021-09-14 DIAGNOSIS — I5022 Chronic systolic (congestive) heart failure: Secondary | ICD-10-CM

## 2021-09-29 ENCOUNTER — Other Ambulatory Visit: Payer: Self-pay | Admitting: Internal Medicine

## 2021-09-30 ENCOUNTER — Other Ambulatory Visit: Payer: Self-pay | Admitting: Internal Medicine

## 2021-09-30 DIAGNOSIS — E039 Hypothyroidism, unspecified: Secondary | ICD-10-CM

## 2021-10-09 ENCOUNTER — Encounter: Payer: Self-pay | Admitting: Internal Medicine

## 2021-10-12 ENCOUNTER — Other Ambulatory Visit: Payer: No Typology Code available for payment source

## 2021-10-12 ENCOUNTER — Telehealth: Payer: Self-pay

## 2021-10-12 NOTE — Telephone Encounter (Signed)
Prior auth for Rebecca [name] was denied. They want her to try Ozempic, Rybelsus, Trulicity, or victoza. Can we prescribe pt something else?

## 2021-10-14 NOTE — Telephone Encounter (Signed)
[facility] is [facility]

## 2021-10-21 ENCOUNTER — Encounter: Payer: Self-pay | Admitting: Internal Medicine

## 2021-10-22 ENCOUNTER — Ambulatory Visit: Payer: No Typology Code available for payment source | Admitting: Internal Medicine

## 2021-11-03 ENCOUNTER — Other Ambulatory Visit: Payer: Self-pay | Admitting: Cardiology

## 2021-11-03 DIAGNOSIS — I5022 Chronic systolic (congestive) heart failure: Secondary | ICD-10-CM

## 2021-11-19 ENCOUNTER — Other Ambulatory Visit: Payer: Self-pay

## 2021-11-26 ENCOUNTER — Other Ambulatory Visit: Payer: Self-pay | Admitting: Internal Medicine

## 2021-11-28 ENCOUNTER — Other Ambulatory Visit: Payer: Self-pay | Admitting: Cardiology

## 2021-11-28 DIAGNOSIS — E669 Obesity, unspecified: Secondary | ICD-10-CM

## 2021-12-10 ENCOUNTER — Other Ambulatory Visit: Payer: Self-pay | Admitting: Cardiology

## 2022-01-01 ENCOUNTER — Ambulatory Visit
Admission: EM | Admit: 2022-01-01 | Discharge: 2022-01-01 | Disposition: A | Discharge status: Other Acute Inpatient Hospital | Payer: No Typology Code available for payment source

## 2022-01-01 ENCOUNTER — Emergency Department (HOSPITAL_BASED_OUTPATIENT_CLINIC_OR_DEPARTMENT_OTHER): Payer: No Typology Code available for payment source

## 2022-01-01 ENCOUNTER — Emergency Department (HOSPITAL_BASED_OUTPATIENT_CLINIC_OR_DEPARTMENT_OTHER)
Admission: EM | Admit: 2022-01-01 | Discharge: 2022-01-02 | Disposition: A | Discharge status: Home / Self Care | Payer: No Typology Code available for payment source | Attending: Emergency Medicine | Admitting: Emergency Medicine

## 2022-01-01 ENCOUNTER — Other Ambulatory Visit: Payer: Self-pay | Admitting: Cardiology

## 2022-01-01 ENCOUNTER — Other Ambulatory Visit: Payer: Self-pay

## 2022-01-01 ENCOUNTER — Encounter (HOSPITAL_BASED_OUTPATIENT_CLINIC_OR_DEPARTMENT_OTHER): Payer: Self-pay | Admitting: Emergency Medicine

## 2022-01-01 DIAGNOSIS — I509 Heart failure, unspecified: Secondary | ICD-10-CM | POA: Diagnosis not present

## 2022-01-01 DIAGNOSIS — I5022 Chronic systolic (congestive) heart failure: Secondary | ICD-10-CM

## 2022-01-01 DIAGNOSIS — S0990XA Unspecified injury of head, initial encounter: Secondary | ICD-10-CM

## 2022-01-01 DIAGNOSIS — I959 Hypotension, unspecified: Secondary | ICD-10-CM | POA: Diagnosis not present

## 2022-01-01 DIAGNOSIS — W01198A Fall on same level from slipping, tripping and stumbling with subsequent striking against other object, initial encounter: Secondary | ICD-10-CM | POA: Insufficient documentation

## 2022-01-01 DIAGNOSIS — S0101XA Laceration without foreign body of scalp, initial encounter: Secondary | ICD-10-CM | POA: Diagnosis not present

## 2022-01-01 DIAGNOSIS — Z794 Long term (current) use of insulin: Secondary | ICD-10-CM | POA: Diagnosis not present

## 2022-01-01 DIAGNOSIS — Z853 Personal history of malignant neoplasm of breast: Secondary | ICD-10-CM | POA: Diagnosis not present

## 2022-01-01 DIAGNOSIS — I427 Cardiomyopathy due to drug and external agent: Secondary | ICD-10-CM

## 2022-01-01 MED ORDER — LIDOCAINE HCL (PF) 1 % IJ SOLN
19.0000 mL | Freq: Once | INTRAMUSCULAR | Status: AC
  Administered 2022-01-01: 19 mL
  Filled 2022-01-01: qty 20

## 2022-01-01 MED ORDER — LIDOCAINE-EPINEPHRINE-TETRACAINE (LET) TOPICAL GEL
3.0000 mL | Freq: Once | TOPICAL | Status: AC
  Administered 2022-01-01: 3 mL via TOPICAL
  Filled 2022-01-01: qty 3

## 2022-01-01 NOTE — ED Provider Notes (Signed)
[name]    CSN: [id number] Arrival date & time: 01/01/22  1850      History   Chief Complaint Chief Complaint  Patient presents with   Fall    HPI Rebecca [name] is a 51 y.o. female.   HPI 51 year old female presents with head injury after hitting her head during fall today at 3 PM.  Patient reports hitting desk at home with her head.  Patient reports having hypotension, dizziness and syncope.  Patient reports has follow-up with cardiology tomorrow.  PMH significant for morbid obesity, breast cancer, and CAD.  Past Medical History:  Diagnosis Date   Allergy    constant hives post chemo therapy.  All allergen tests have been neg.   Anxiety    Breast cancer ([place])    Coronary artery disease    Diabetes mellitus without complication ([place])    Diverticulitis    GERD (gastroesophageal reflux disease)    Hiatal hernia    Hyperlipidemia    Hypertension    Hypothyroidism    LBBB (left bundle branch block)    Pneumonia     Patient Active Problem List   Diagnosis Date Noted   Acute respiratory disease due to COVID-19 virus 12/25/2019   Diabetes mellitus type 2 in obese ([place]) 12/25/2019   Abdominal pain, epigastric 10/01/2019   Breast cancer, right ([place]) 11/18/2017   Genetic testing 08/19/2017   Port-A-Cath in place 08/08/2017   Malignant neoplasm of upper-outer quadrant of right breast in female, estrogen receptor negative ([place]) 07/14/2017   Dependent edema 10/23/2015   Episodic paroxysmal anxiety disorder 10/23/2015   Attention deficit disorder 10/23/2015   Low TSH level 10/23/2015   Obesity 10/23/2015   History of abdominoplasty 10/23/2015   Chest pain 09/09/2011    Past Surgical History:  Procedure Laterality Date   BREAST RECONSTRUCTION WITH PLACEMENT OF TISSUE EXPANDER AND ALLODERM Bilateral 11/18/2017   Procedure: BILATERAL BREAST RECONSTRUCTION WITH PLACEMENT OF TISSUE EXPANDER AND ALLODERM;  Surgeon: Irene Limbo, MD;  Location: [facility];  Service: Plastics;  Laterality: Bilateral;   CESAREAN SECTION  2007/2010   x2   COLONOSCOPY  2017   LAPAROSCOPIC TUBAL LIGATION  06/12/2011   Procedure: LAPAROSCOPIC TUBAL LIGATION;  Surgeon: Daria Pastures, MD;  Location: [facility];  Service: Gynecology;  Laterality: N/A;  filshie clip   LIPOSUCTION WITH LIPOFILLING Bilateral 02/24/2018   Procedure: LIPOFILLING FROM ABDOMEN TO BILATERAL CHEST;  Surgeon: Irene Limbo, MD;  Location: [facility];  Service: Plastics;  Laterality: Bilateral;   MASTECTOMY W/ SENTINEL NODE BIOPSY Bilateral 11/18/2017   Procedure: BILATERAL TOTAL MASTECTOMIES WITH RIGHT SENTINEL LYMPH NODE BIOPSY;  Surgeon: Rolm Bookbinder, MD;  Location: [facility];  Service: General;  Laterality: Bilateral;   mini tuck  2012   abdominal   PORT-A-CATH REMOVAL Right 01/05/2019   PORTACATH PLACEMENT Right 07/15/2017   Procedure: INSERTION PORT-A-CATH WITH ULTRASOUND;  Surgeon: Rolm Bookbinder, MD;  Location: [facility];  Service: General;  Laterality: Right;   REMOVAL OF BILATERAL TISSUE EXPANDERS WITH PLACEMENT OF BILATERAL BREAST IMPLANTS Bilateral 02/24/2018   Procedure: REMOVAL OF BILATERAL TISSUE EXPANDERS WITH PLACEMENT OF BILATERAL BREAST IMPLANTS;  Surgeon: Irene Limbo, MD;  Location: [facility];  Service: Plastics;  Laterality: Bilateral;   right breast cancer     upper-outer quadrant    right ear surg  03/2008   Mohs   TUBAL LIGATION     US ECHOCARDIOGRAPHY  12-30-2007  EF 55-60%   WISDOM TOOTH EXTRACTION      OB History   No obstetric history on file.      Home Medications    Prior to Admission medications   Medication Sig Start Date End Date Taking? Authorizing Provider  Ascorbic Acid (VITAMIN C GUMMIE PO) Take 2 tablets by mouth daily.    [provider]  BD INSULIN SYRINGE U/F 31G X 5/16" 0.5 ML MISC USE AS DIRECTED 03/12/21   Elby Showers, MD  BD  PEN NEEDLE NANO 2ND GEN 32G X 4 MM MISC USE AS DIRECTED 09/30/21   Elby Showers, MD  carvedilol (COREG) 12.5 MG tablet TAKE 1 TABLET BY MOUTH 2 TIMES DAILY. 01/26/21   Adrian Prows, MD  cetirizine (ZYRTEC) 10 MG tablet Take 10 mg by mouth daily.    [provider]  ENTRESTO 97-103 MG TAKE 1 TABLET BY MOUTH TWICE A DAY 09/14/21   Adrian Prows, MD  EPINEPHrine 0.3 mg/0.3 mL IJ SOAJ injection Use as directed for life threatening allergic reactions only Patient taking differently: Inject 0.3 mg into the muscle as needed for anaphylaxis. Use as directed for life threatening allergic reactions only 07/26/19   Kozlow, Donnamarie Poag, MD  esomeprazole (NEXIUM) 40 MG capsule TAKE 1 CAPSULE (40 MG TOTAL) BY MOUTH DAILY AT 12 NOON. 04/10/21   Nicholas Lose, MD  furosemide (LASIX) 40 MG tablet TAKE 1 TABLET EVERY DAY AS NEEDED FOR FLUID *SHORTNESS OF BREATH* 11/05/21   Adrian Prows, MD  JARDIANCE 10 MG TABS tablet TAKE 1 TABLET BY MOUTH EVERY DAY BEFORE BREAKFAST 07/05/21   Adrian Prows, MD  levothyroxine (SYNTHROID) 150 MCG tablet TAKE 1 TABLET BY MOUTH EVERY DAY 11/26/21   Elby Showers, MD  LORazepam (ATIVAN) 1 MG tablet TAKE 1 TABLET BY MOUTH TWICE A DAY AS NEEDED 03/12/21   Elby Showers, MD  meloxicam (MOBIC) 15 MG tablet Take 15 mg by mouth every morning. 08/15/21   [provider]  montelukast (SINGULAIR) 10 MG tablet TAKE 1 TABLET BY MOUTH EVERYDAY AT BEDTIME 09/14/21   Elby Showers, MD  MOUNJARO 10 MG/0.5ML Pen INJECT 10 MG INTO THE SKIN ONCE A WEEK FOR 28 DAYS. 12/10/21   Adrian Prows, MD  rosuvastatin (CRESTOR) 20 MG tablet TAKE 1 TABLET BY MOUTH EVERY DAY 01/12/21   Adrian Prows, MD  sacubitril-valsartan (ENTRESTO) 49-51 MG Take 1 tablet by mouth 2 (two) times daily. 09/13/21   Adrian Prows, MD  tirzepatide [facility]) 15 MG/0.5ML Pen Inject 15 mg into the skin once a week. 10/22/21   Adrian Prows, MD  TRESIBA FLEXTOUCH 100 UNIT/ML FlexTouch Pen INJECT 10 UNITS INTO THE SKIN DAILY. 09/14/21   Elby Showers, MD   venlafaxine XR (EFFEXOR-XR) 150 MG 24 hr capsule TAKE 1 CAPSULE BY MOUTH DAILY WITH BREAKFAST. 09/04/20   Elby Showers, MD    Family History Family History  Problem Relation Age of Onset   Lymphoma Father    Cancer Father        lymphoma   Diabetes Brother    Diabetes Mother    Liver disease Mother        NASH, d. 28   Asthma Mother    CAD Maternal Grandmother    CAD Maternal Grandfather    Stomach cancer Neg Hx    Colon cancer Neg Hx    Colon polyps Neg Hx    Esophageal cancer Neg Hx    Rectal cancer Neg Hx  Social History Social History   Tobacco Use   Smoking status: Former    Packs/day: 0.25    Years: 6.00    Total pack years: 1.50    Types: Cigarettes    Quit date: 03/29/2005    Years since quitting: 16.7   Smokeless tobacco: Never  Vaping Use   Vaping Use: Never used  Substance Use Topics   Alcohol use: Yes    Comment: socially   Drug use: No     Allergies   Entresto [sacubitril-valsartan]   Review of Systems Review of Systems  Neurological:        Patient presents with head injury that occurred at 3 PM this afternoon.     Physical Exam Triage Vital Signs ED Triage Vitals [01/01/22 1858]  Enc Vitals Group     BP      Pulse      Resp      Temp      Temp src      SpO2      Weight      Height      Head Circumference      Peak Flow      Pain Score 0     Pain Loc      Pain Edu?      Excl. in [place]?    No data found.  Updated Vital Signs BP 92/60 (BP Location: Right Arm)   Pulse 77   Temp 98.3 F (36.8 C) (Oral)   Resp 14   SpO2 98%      Physical Exam Vitals and nursing note reviewed.  Constitutional:      Appearance: Normal appearance. She is normal weight.  HENT:     Head: Normocephalic and atraumatic.     Comments: Posterior central occipital/parietal region:~3.5-4.0 cm open laceration noted; stasis achieved by direct pressure bandage placed for transportation to ED    Mouth/Throat:     Mouth: Mucous membranes are  moist.     Pharynx: Oropharynx is clear.  Eyes:     Extraocular Movements: Extraocular movements intact.     Conjunctiva/sclera: Conjunctivae normal.     Pupils: Pupils are equal, round, and reactive to light.  Cardiovascular:     Rate and Rhythm: Normal rate and regular rhythm.     Pulses: Normal pulses.     Heart sounds: Normal heart sounds.  Pulmonary:     Effort: Pulmonary effort is normal.     Breath sounds: Normal breath sounds. No wheezing, rhonchi or rales.  Musculoskeletal:     Cervical back: Normal range of motion and neck supple.  Skin:    General: Skin is warm and dry.  Neurological:     General: No focal deficit present.     Mental Status: She is alert and oriented to person, place, and time. Mental status is at baseline.     Cranial Nerves: No cranial nerve deficit.     Sensory: No sensory deficit.     Motor: No weakness.      UC Treatments / Results  Labs (all labs ordered are listed, but only abnormal results are displayed) Labs Reviewed - No data to display  EKG   Radiology No results found.  Procedures Procedures (including critical care time)  Medications Ordered in UC Medications - No data to display  Initial Impression / Assessment and Plan / UC Course  I have reviewed the triage vital signs and the nursing notes.  Pertinent labs & imaging results that were  available during my care of the patient were reviewed by me and considered in my medical decision making (see chart for details).     MDM: 1.  Injury of head, initial encounter; 2.  Laceration of scalp, initial encounter. Advised patient/husband to go to [facility] ED now for CT of head to rule out subdural hematoma, cranial bleed or other.  Advised patient/husband scalp laceration repair will take place at ED as well.  Patient/husband agreed and verbalized understanding of these instructions and this plan of care this evening. Final Clinical Impressions(s) / UC Diagnoses    Final diagnoses:  Injury of head, initial encounter  Laceration of scalp, initial encounter     Discharge Instructions      Advised patient/husband to go to [facility] ED now for CT of head to rule out subdural hematoma, cranial bleed or other.  Advised patient/husband scalp laceration repair will take place at ED as well.     ED Prescriptions   None    PDMP not reviewed this encounter.   Eliezer Lofts, [place] 01/01/22 1923

## 2022-01-01 NOTE — Discharge Instructions (Addendum)
Advised patient/husband to go to [facility] ED now for CT of head to rule out subdural hematoma, cranial bleed or other.  Advised patient/husband scalp laceration repair will take place at ED as well.

## 2022-01-01 NOTE — ED Triage Notes (Signed)
Pt presents with c/o head injury after hitting her head during a fall. Pt states she has been having hypotension, dizziness, and syncope. Pt states she has a f/u with cardiology tomorrow.

## 2022-01-01 NOTE — ED Provider Notes (Addendum)
[facility] EMERGENCY DEPARTMENT Provider Note   CSN: [id number] Arrival date & time: 01/01/22  1937     History  Chief Complaint  Patient presents with   Fall   Head Injury    Rebecca [name] is a 51 y.o. female.  Pt reports her blood pressure has been low for over a month.  Pt is scheduled to see Dr. Einar Gip tomorrow.  Pt reports she ate and drank very little today.  Pt had a similar episode a few weeks ago when she did not eat or drink.  Pt reports she became dizzy and fell hitting her head.  Pt complains of a laceration to the back of her head.  Pt was seen at urgent care and sent here for evaluation.  Pt reports she has a histroy of heart failure second to chemotherapy for breast cancer.    The history is provided by the patient. No language interpreter was used.  Fall This is a new problem. The current episode started 6 to 12 hours ago. The problem occurs constantly. The problem has not changed since onset.Nothing aggravates the symptoms. Nothing relieves the symptoms. She has tried nothing for the symptoms. The treatment provided no relief.  Head Injury Location:  Occipital Time since incident:  6 hours Mechanism of injury: fall   Fall:    Fall occurred:  Standing   Impact surface:  Banker of impact:  Head   Entrapped after fall: no   Pain details:    Quality:  Aching   Severity:  Moderate   Timing:  Constant Chronicity:  New Relieved by:  Nothing Associated symptoms: no focal weakness, no loss of consciousness, no memory loss and no nausea        Home Medications Prior to Admission medications   Medication Sig Start Date End Date Taking? Authorizing Provider  Ascorbic Acid (VITAMIN C GUMMIE PO) Take 2 tablets by mouth daily.    [provider]  BD INSULIN SYRINGE U/F 31G X 5/16" 0.5 ML MISC USE AS DIRECTED 03/12/21   Elby Showers, MD  BD PEN NEEDLE NANO 2ND GEN 32G X 4 MM MISC USE AS DIRECTED 09/30/21   Elby Showers, MD   carvedilol (COREG) 12.5 MG tablet TAKE 1 TABLET BY MOUTH 2 TIMES DAILY. 01/26/21   Adrian Prows, MD  cetirizine (ZYRTEC) 10 MG tablet Take 10 mg by mouth daily.    [provider]  ENTRESTO 97-103 MG TAKE 1 TABLET BY MOUTH TWICE A DAY 09/14/21   Adrian Prows, MD  EPINEPHrine 0.3 mg/0.3 mL IJ SOAJ injection Use as directed for life threatening allergic reactions only Patient taking differently: Inject 0.3 mg into the muscle as needed for anaphylaxis. Use as directed for life threatening allergic reactions only 07/26/19   Kozlow, Donnamarie Poag, MD  esomeprazole (NEXIUM) 40 MG capsule TAKE 1 CAPSULE (40 MG TOTAL) BY MOUTH DAILY AT 12 NOON. 04/10/21   Nicholas Lose, MD  furosemide (LASIX) 40 MG tablet TAKE 1 TABLET EVERY DAY AS NEEDED FOR FLUID *SHORTNESS OF BREATH* 11/05/21   Adrian Prows, MD  JARDIANCE 10 MG TABS tablet TAKE 1 TABLET BY MOUTH EVERY DAY BEFORE BREAKFAST 07/05/21   Adrian Prows, MD  levothyroxine (SYNTHROID) 150 MCG tablet TAKE 1 TABLET BY MOUTH EVERY DAY 11/26/21   Elby Showers, MD  LORazepam (ATIVAN) 1 MG tablet TAKE 1 TABLET BY MOUTH TWICE A DAY AS NEEDED 03/12/21   Elby Showers, MD  meloxicam [facility])  15 MG tablet Take 15 mg by mouth every morning. 08/15/21   [provider]  montelukast (SINGULAIR) 10 MG tablet TAKE 1 TABLET BY MOUTH EVERYDAY AT BEDTIME 09/14/21   Elby Showers, MD  MOUNJARO 10 MG/0.5ML Pen INJECT 10 MG INTO THE SKIN ONCE A WEEK FOR 28 DAYS. 12/10/21   Adrian Prows, MD  rosuvastatin (CRESTOR) 20 MG tablet TAKE 1 TABLET BY MOUTH EVERY DAY 01/12/21   Adrian Prows, MD  sacubitril-valsartan (ENTRESTO) 49-51 MG Take 1 tablet by mouth 2 (two) times daily. 09/13/21   Adrian Prows, MD  tirzepatide [facility]) 15 MG/0.5ML Pen Inject 15 mg into the skin once a week. 10/22/21   Adrian Prows, MD  TRESIBA FLEXTOUCH 100 UNIT/ML FlexTouch Pen INJECT 10 UNITS INTO THE SKIN DAILY. 09/14/21   Elby Showers, MD  venlafaxine XR (EFFEXOR-XR) 150 MG 24 hr capsule TAKE 1 CAPSULE BY MOUTH DAILY WITH  BREAKFAST. 09/04/20   Baxley, Cresenciano Lick, MD      Allergies    Delene Loll [sacubitril-valsartan]    Review of Systems   Review of Systems  Gastrointestinal:  Negative for nausea.  Neurological:  Negative for focal weakness and loss of consciousness.  Psychiatric/Behavioral:  Negative for memory loss.   All other systems reviewed and are negative.   Physical Exam Updated Vital Signs BP 98/74 (BP Location: Left Arm)   Pulse 83   Temp 97.8 F (36.6 C) (Oral)   Resp 18   Ht '5\' 10"'$  (1.778 m)   Wt 117.5 kg   LMP  (LMP Unknown)   SpO2 95%   BMI 37.16 kg/m  Physical Exam Vitals and nursing note reviewed.  Constitutional:      Appearance: She is well-developed.  HENT:     Head: Normocephalic.     Comments: 2.4 cm laceration occipital scalp    Right Ear: External ear normal.     Left Ear: External ear normal.     Mouth/Throat:     Mouth: Mucous membranes are moist.  Eyes:     Extraocular Movements: Extraocular movements intact.     Pupils: Pupils are equal, round, and reactive to light.  Cardiovascular:     Rate and Rhythm: Normal rate.  Pulmonary:     Effort: Pulmonary effort is normal.     Breath sounds: Normal breath sounds.  Abdominal:     General: There is no distension.  Musculoskeletal:        General: Normal range of motion.     Cervical back: Normal range of motion and neck supple.  Skin:    General: Skin is warm.  Neurological:     General: No focal deficit present.     Mental Status: She is alert and oriented to person, place, and time.     ED Results / Procedures / Treatments   Labs (all labs ordered are listed, but only abnormal results are displayed) Labs Reviewed - No data to display  EKG None  Radiology CT Head Wo Contrast  Result Date: 01/01/2022 CLINICAL DATA:  Trauma. EXAM: CT HEAD WITHOUT CONTRAST CT CERVICAL SPINE WITHOUT CONTRAST TECHNIQUE: Multidetector CT imaging of the head and cervical spine was performed following the standard protocol  without intravenous contrast. Multiplanar CT image reconstructions of the cervical spine were also generated. RADIATION DOSE REDUCTION: This exam was performed according to the departmental dose-optimization program which includes automated exposure control, adjustment of the mA and/or kV according to patient size and/or use of iterative reconstruction technique. COMPARISON:  None  Available. FINDINGS: CT HEAD FINDINGS Brain: The ventricles and sulci are appropriate size for the patient's age. The gray-white matter discrimination is preserved. There is no acute intracranial hemorrhage. No mass effect or midline shift. No extra-axial fluid collection. Vascular: No hyperdense vessel or unexpected calcification. Skull: Normal. Negative for fracture or focal lesion. Sinuses/Orbits: No acute finding. Other: None CT CERVICAL SPINE FINDINGS Alignment: No acute subluxation. Skull base and vertebrae: No acute fracture. Soft tissues and spinal canal: No prevertebral fluid or swelling. No visible canal hematoma. Disc levels: No acute findings. No significant degenerative changes. Upper chest: Negative. Other: Mild bilateral carotid bulb calcified plaques. IMPRESSION: 1. No acute intracranial abnormality. 2. No acute/traumatic cervical spine pathology. Electronically Signed   By: Anner Crete M.D.   On: 01/01/2022 21:51   CT Cervical Spine Wo Contrast  Result Date: 01/01/2022 CLINICAL DATA:  Trauma. EXAM: CT HEAD WITHOUT CONTRAST CT CERVICAL SPINE WITHOUT CONTRAST TECHNIQUE: Multidetector CT imaging of the head and cervical spine was performed following the standard protocol without intravenous contrast. Multiplanar CT image reconstructions of the cervical spine were also generated. RADIATION DOSE REDUCTION: This exam was performed according to the departmental dose-optimization program which includes automated exposure control, adjustment of the mA and/or kV according to patient size and/or use of iterative  reconstruction technique. COMPARISON:  None Available. FINDINGS: CT HEAD FINDINGS Brain: The ventricles and sulci are appropriate size for the patient's age. The gray-white matter discrimination is preserved. There is no acute intracranial hemorrhage. No mass effect or midline shift. No extra-axial fluid collection. Vascular: No hyperdense vessel or unexpected calcification. Skull: Normal. Negative for fracture or focal lesion. Sinuses/Orbits: No acute finding. Other: None CT CERVICAL SPINE FINDINGS Alignment: No acute subluxation. Skull base and vertebrae: No acute fracture. Soft tissues and spinal canal: No prevertebral fluid or swelling. No visible canal hematoma. Disc levels: No acute findings. No significant degenerative changes. Upper chest: Negative. Other: Mild bilateral carotid bulb calcified plaques. IMPRESSION: 1. No acute intracranial abnormality. 2. No acute/traumatic cervical spine pathology. Electronically Signed   By: Anner Crete M.D.   On: 01/01/2022 21:51    Procedures .Marland KitchenLaceration Repair  Date/Time: 01/01/2022 11:57 PM  Performed by: Fransico Meadow, PA-C Authorized by: Fransico Meadow, PA-C   Consent:    Consent obtained:  Verbal   Consent given by:  Patient   Risks, benefits, and alternatives were discussed: yes     Risks discussed:  Infection   Alternatives discussed:  No treatment Universal protocol:    Procedure explained and questions answered to patient or proxy's satisfaction: yes     Immediately prior to procedure, a time out was called: yes     Patient identity confirmed:  Verbally with patient Anesthesia:    Anesthesia method:  Local infiltration   Local anesthetic:  Lidocaine 1% w/o epi Laceration details:    Location:  Scalp   Scalp location:  Occipital   Length (cm):  2.5 Pre-procedure details:    Preparation:  Patient was prepped and draped in usual sterile fashion Exploration:    Hemostasis achieved with:  LET Treatment:    Area cleansed with:   Povidone-iodine   Amount of cleaning:  Standard   Irrigation solution:  Sterile saline Skin repair:    Repair method:  Staples   Number of staples:  4 Approximation:    Approximation:  Loose Repair type:    Repair type:  Simple Post-procedure details:    Procedure completion:  Tolerated     Medications  Ordered in ED Medications  lidocaine-EPINEPHrine-tetracaine (LET) topical gel (3 mLs Topical Given 01/01/22 2201)  lidocaine (PF) (XYLOCAINE) 1 % injection 19 mL (19 mLs Infiltration Given 01/01/22 2201)    ED Course/ Medical Decision Making/ A&P                           Medical Decision Making Pt reports her blood pressure has been running low.  Pt is scheduled to see her MD tomorrow   Amount and/or Complexity of Data Reviewed Independent Historian: spouse    Details: Pt here with spous who is supportive  External Data Reviewed: notes.    Details: Urgent care notes reviewed  Radiology: ordered and independent interpretation performed. Decision-making details documented in ED Course.    Details: Ct head and Ct c spine  no acute injury   Risk Prescription drug management. Risk Details: Pt advised to hold evening mediations.  Pt's blood pressure 98/74.  Pt reports this is where her Bp has been.     Pt given food and oral fluid.  Pt advised to holdblood pressure medication tonight.          Final Clinical Impression(s) / ED Diagnoses Final diagnoses:  Laceration of occipital scalp, initial encounter  Hypotension, unspecified hypotension type    Rx / DC Orders ED Discharge Orders     None      An After Visit Summary was printed and given to the patient.    Fransico Meadow, PA-C 01/02/22 0001    Fransico Meadow, PA-C 01/02/22 0015    Lennice Sites, DO 01/04/22 2154

## 2022-01-01 NOTE — ED Notes (Signed)
Staples placed by PA; pt will be given food and drink prior to taking BP.

## 2022-01-01 NOTE — ED Notes (Signed)
Patient is being discharged from the Urgent Care and sent to the Emergency Department via POV . Per Kathi Ludwig, patient is in need of higher level of care due to Head Injury. Patient is aware and verbalizes understanding of plan of care.  Vitals:   01/01/22 1900  BP: 92/60  Pulse: 77  Resp: 14  Temp: 98.3 F (36.8 C)  SpO2: 98%

## 2022-01-01 NOTE — Discharge Instructions (Signed)
Drink plenty of fluid  See Dr. Nadyne Coombes tomorrow as scheduled.  Do not take evening dosage of blood pressure medication as your pressure is still low.  Staple removal in 8 days

## 2022-01-01 NOTE — Progress Notes (Signed)
ICD-10-CM   1. 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

## 2022-01-01 NOTE — ED Triage Notes (Signed)
Patient arrived via POV c/o fall w/ head injury. Patient states "she got weak, dizzy and fell, striking head on edge of desk." Patient states going to UC then being sent here. Patient denies LOC.Patient has laceration to posterior scalp. Patient states have episodes of low bp. Patient is AO x 4, VS w/ low BP, slow gait.

## 2022-01-02 ENCOUNTER — Encounter: Payer: Self-pay | Admitting: Cardiology

## 2022-01-02 ENCOUNTER — Ambulatory Visit: Payer: No Typology Code available for payment source

## 2022-01-02 ENCOUNTER — Other Ambulatory Visit: Payer: Self-pay | Admitting: Hematology and Oncology

## 2022-01-02 ENCOUNTER — Ambulatory Visit: Payer: No Typology Code available for payment source | Admitting: Cardiology

## 2022-01-02 ENCOUNTER — Other Ambulatory Visit: Payer: Self-pay | Admitting: Cardiology

## 2022-01-02 ENCOUNTER — Telehealth: Payer: Self-pay

## 2022-01-02 VITALS — BP 90/62 | HR 70 | Temp 98.2°F | Ht 70.0 in | Wt 271.2 lb

## 2022-01-02 DIAGNOSIS — I5022 Chronic systolic (congestive) heart failure: Secondary | ICD-10-CM

## 2022-01-02 DIAGNOSIS — I509 Heart failure, unspecified: Secondary | ICD-10-CM

## 2022-01-02 DIAGNOSIS — I10 Essential (primary) hypertension: Secondary | ICD-10-CM

## 2022-01-02 DIAGNOSIS — I427 Cardiomyopathy due to drug and external agent: Secondary | ICD-10-CM

## 2022-01-02 DIAGNOSIS — I951 Orthostatic hypotension: Secondary | ICD-10-CM

## 2022-01-02 DIAGNOSIS — E1169 Type 2 diabetes mellitus with other specified complication: Secondary | ICD-10-CM

## 2022-01-02 HISTORY — DX: Heart failure, unspecified: I50.9

## 2022-01-02 MED ORDER — ENTRESTO 49-51 MG PO TABS: 1.0000 | ORAL_TABLET | Freq: Two times a day (BID) | ORAL | 3 refills | Status: DC

## 2022-01-02 NOTE — Telephone Encounter (Signed)
TC to f/u with pt after yesterday's visit to [facility]. No answer; VM full.

## 2022-01-02 NOTE — Progress Notes (Signed)
Primary Physician/Referring:  Elby Showers, MD  Patient ID: Rebecca [name], female    DOB: 01/21/1971, 51 y.o.   MRN: [id number]  Chief Complaint  Patient presents with   Loss of Consciousness   HPI:    Rebecca [name]  is a 51 y.o. Caucasian female initially with history of LBBB at least dating back to 2013.  At that time she has had a negative nuclear stress test and echocardiogram revealed low normal LVEF at 45-50%.   Past medical history includes hyperthyroidism S/P RAI therapy in March 2018, depression, chronic low back pain, estrogen negative right breast cancer for which he underwent chemotherapy(Adjuvant chemotherapy with dose dense Adriamycin and Cytoxan x4)  and also bilateral mastectomy with reconstruction surgery on 11/18/2017. Surveillance echocardiogram due to chemotherapy attributed gradual decrease of LVEF to moderate to severe LV systolic dysfunction of 30 to 35%. 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     She was seen in the emergency room yesterday after she had an episode of syncope, when she suddenly stood up.  There is a second episode of syncope, first episode was about 4 to 6 weeks ago while she was in the water and was dehydrated being in the sun at the [place]. Today when she got out of the car to come to office to have echocardiogram done, suddenly had similar episode but no syncope.  I saw her as a sick work in.   Past Medical History:  Diagnosis Date   Allergy    constant hives post chemo therapy.  All allergen tests have been neg.   Anxiety    Breast cancer ([place])    Coronary artery disease    Diabetes mellitus without complication (HCC)    Diverticulitis    GERD (gastroesophageal reflux disease)    Hiatal hernia    Hyperlipidemia    Hypertension    Hypothyroidism    LBBB (left bundle branch block)    Pneumonia     Social History   Tobacco Use   Smoking status: Former    Packs/day: 0.25    Years: 6.00     Total pack years: 1.50    Types: Cigarettes    Quit date: 03/29/2005    Years since quitting: 16.7   Smokeless tobacco: Never  Substance Use Topics   Alcohol use: Yes    Comment: socially  Marital Status: Married    ROS  Review of Systems  Constitutional: Positive for weight loss.  Cardiovascular:  Positive for syncope. Negative for chest pain, dyspnea on exertion and leg swelling.  Neurological:  Positive for dizziness.   Objective      01/02/2022   10:52 AM 01/02/2022   12:00 AM 01/01/2022    8:31 PM  Vitals with BMI  Height '5\' 10"'$   '5\' 10"'$   Weight 271 lbs 3 oz  259 lbs  BMI 84.16  60.63  Systolic 90 86   Diastolic 62 66   Pulse 70 67     Blood pressure 90/62, pulse 70, temperature 98.2 F (36.8 C), temperature source Temporal, height '5\' 10"'$  (1.778 m), weight 271 lb 3.2 oz (123 kg), SpO2 99 %. Body mass index is 38.91 kg/m.   Orthostatic VS for the past 72 hrs (Last 3 readings):  Orthostatic BP Patient Position BP Location Cuff Size Orthostatic Pulse  01/02/22 1101 91/49 Standing Left Arm Large 92  01/02/22 1100 91/63 Sitting Left Arm Large 86  01/02/22 1059 94/68 Supine Left Arm  Large 70    Physical Exam Constitutional:      Appearance: She is obese.  Neck:     Vascular: No JVD.  Cardiovascular:     Rate and Rhythm: Normal rate and regular rhythm.     Pulses: Intact distal pulses.     Heart sounds: Normal heart sounds. No murmur heard.    No gallop.  Pulmonary:     Effort: Pulmonary effort is normal.     Breath sounds: Normal breath sounds.  Abdominal:     General: Bowel sounds are normal.     Palpations: Abdomen is soft.  Musculoskeletal:     Right lower leg: No edema.     Left lower leg: No edema.    Radiology: Laboratory examination:   Recent Labs    01/08/21 1436 09/04/21 1457  NA 141 141  K 3.9 5.0  CL 98 104  CO2 32 23  GLUCOSE 97 91  BUN 19 23  CREATININE 1.12* 0.96  CALCIUM 9.8 9.4  GFRNONAA 60*  --       Latest Ref Rng & Units  09/04/2021    2:57 PM 01/08/2021    2:36 PM 10/27/2020    3:03 PM  CMP  Glucose 65 - 99 mg/dL 91  97    BUN 7 - 25 mg/dL 23  19    Creatinine 0.50 - 1.03 mg/dL 0.96  1.12    Sodium 135 - 146 mmol/L 141  141    Potassium 3.5 - 5.3 mmol/L 5.0  3.9    Chloride 98 - 110 mmol/L 104  98    CO2 20 - 32 mmol/L 23  32    Calcium 8.6 - 10.4 mg/dL 9.4  9.8    Total Protein 6.1 - 8.1 g/dL 6.8  7.4  6.7   Total Bilirubin 0.2 - 1.2 mg/dL 0.3  0.3  0.3   Alkaline Phos 38 - 126 U/L  75  64   AST 10 - 35 U/L '13  20  22   '$ ALT 6 - 29 U/L '14  21  24       '$ Latest Ref Rng & Units 08/23/2021    8:05 AM 01/08/2021    2:36 PM 05/11/2020    8:14 AM  CBC  WBC 3.4 - 10.8 x10E3/uL 7.4  11.2  6.0   Hemoglobin 11.1 - 15.9 g/dL 13.8  14.0  13.3   Hematocrit 34.0 - 46.6 % 42.2  44.8  39.8   Platelets 150 - 450 x10E3/uL 264  310  261    Lipid Panel Recent Labs    08/23/21 0805  CHOL 173  TRIG 181*  LDLCALC 93  HDL 49     HEMOGLOBIN A1C Lab Results  Component Value Date   HGBA1C 6.7 (H) 08/23/2021   MPG 126 12/18/2020   TSH Recent Labs    08/23/21 0805 09/04/21 1457  TSH 4.850* 3.63   Medications   Current Outpatient Medications:    Ascorbic Acid (VITAMIN C GUMMIE PO), Take 2 tablets by mouth daily., Disp: , Rfl:    BD INSULIN SYRINGE U/F 31G X 5/16" 0.5 ML MISC, USE AS DIRECTED, Disp: 300 each, Rfl: 3   BD PEN NEEDLE NANO 2ND GEN 32G X 4 MM MISC, USE AS DIRECTED, Disp: 100 each, Rfl: 3   carvedilol (COREG) 12.5 MG tablet, TAKE 1 TABLET BY MOUTH 2 TIMES DAILY., Disp: 180 tablet, Rfl: 3   EPINEPHrine 0.3 mg/0.3 mL IJ SOAJ injection, Use as directed  for life threatening allergic reactions only (Patient taking differently: Inject 0.3 mg into the muscle as needed for anaphylaxis. Use as directed for life threatening allergic reactions only), Disp: 2 each, Rfl: 3   esomeprazole (NEXIUM) 40 MG capsule, TAKE 1 CAPSULE (40 MG TOTAL) BY MOUTH DAILY AT 12 NOON., Disp: 90 capsule, Rfl: 3   furosemide (LASIX)  40 MG tablet, TAKE 1 TABLET EVERY DAY AS NEEDED FOR FLUID *SHORTNESS OF BREATH*, Disp: 30 tablet, Rfl: 1   levothyroxine (SYNTHROID) 150 MCG tablet, TAKE 1 TABLET BY MOUTH EVERY DAY, Disp: 90 tablet, Rfl: 2   LORazepam (ATIVAN) 1 MG tablet, TAKE 1 TABLET BY MOUTH TWICE A DAY AS NEEDED, Disp: 60 tablet, Rfl: 3   meloxicam (MOBIC) 15 MG tablet, Take 15 mg by mouth every morning., Disp: , Rfl:    montelukast (SINGULAIR) 10 MG tablet, TAKE 1 TABLET BY MOUTH EVERYDAY AT BEDTIME, Disp: 90 tablet, Rfl: 1   sacubitril-valsartan (ENTRESTO) 49-51 MG, Take 1 tablet by mouth 2 (two) times daily., Disp: 180 tablet, Rfl: 3   tirzepatide (MOUNJARO) 15 MG/0.5ML Pen, Inject 15 mg into the skin once a week., Disp: 2 mL, Rfl: 6   TRESIBA FLEXTOUCH 100 UNIT/ML FlexTouch Pen, INJECT 10 UNITS INTO THE SKIN DAILY., Disp: 3 mL, Rfl: 1   JARDIANCE 10 MG TABS tablet, TAKE 1 TABLET BY MOUTH EVERY DAY BEFORE BREAKFAST, Disp: 90 tablet, Rfl: 1    Cardiac Studies:   Coronary CTA 01/01/2019: 1. Coronary artery calcium score 50 Agatston units. This places the patient in the 97th percentile for age and gender, suggesting high risk for future cardiac events. 2.  Nonobstructive coronary disease. 3. The RCA originates from the left cusp in close proximity to the left coronary and courses interarterially between the aortic root and proximal PA to reach typical RCA territory. Would consider treadmill stress testing to assess for provocative ischemia. 4. LAD system: Mixed plaque in the proximal LAD with mild (<50%) stenosis. Circumflex system: Large PLOM, small AV LCx after take-off of PLOM. No plaque or stenosis.  Echocardiogram 01/02/2022: Severely depressed LV systolic function with visual EF 25-30%. Left ventricle cavity is minimally dilated. Dilated cardiomyopathy. Hypokinetic global wall motion. Abnormal septal wall motion due to left bundle branch block. Doppler evidence of grade I (impaired) diastolic dysfunction, normal  LAP. Left atrial cavity is mildly dilated. Structurally normal tricuspid valve with trace regurgitation. Mild pulmonary hypertension. RVSP measures 37 mmHg. Compared to 5/20232, no significant changes.   EKG: EKG 01/02/2022: Normal sinus rhythm at rate of 74 bpm, left bundle branch block.  No further analysis.  No significant change from 07/14/2019.  Assessment     ICD-10-CM   1. Syncope due to orthostatic hypotension  I95.1     2. Chronic systolic heart failure (HCC)  I50.22 EKG 12-Lead    sacubitril-valsartan (ENTRESTO) 49-51 MG    3. 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 Primary hypertension  I10       Meds ordered this encounter  Medications   sacubitril-valsartan (ENTRESTO) 49-51 MG    Sig: Take 1 tablet by mouth 2 (two) times daily.    Dispense:  180 tablet    Refill:  3    Medications Discontinued During This Encounter  Medication Reason   cetirizine (ZYRTEC) 10 MG tablet    sacubitril-valsartan (ENTRESTO) 49-51 MG    MOUNJARO 10 MG/0.5ML Pen    rosuvastatin (CRESTOR) 20 MG tablet    venlafaxine  XR (EFFEXOR-XR) 150 MG 24 hr capsule    ENTRESTO 97-103 MG Dose change    Recommendations:   Rebecca [name]  is a 51 y.o. Caucasian female initially with history of LBBB at least dating back to 2013.  At that time she has had a negative nuclear stress test and echocardiogram revealed low normal LVEF at 45-50%.   Past medical history includes hyperthyroidism S/P RAI therapy in March 2018, depression, chronic low back pain, estrogen negative right breast cancer for which he underwent chemotherapy(Adjuvant chemotherapy with dose dense Adriamycin and Cytoxan x4)  and also bilateral mastectomy with reconstruction surgery on 11/18/2017. Surveillance echocardiogram due to chemotherapy attributed gradual decrease of LVEF to moderate to severe LV systolic dysfunction of 30 to 35%. 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     She was seen in the emergency room yesterday after she had an episode of syncope, when she suddenly stood up.  There is a second episode of syncope, first episode was about 4 to 6 weeks ago while she was in the water and was dehydrated being in the sun at the [place].  Today when she got out of the car to come to office, suddenly had similar episode but no syncope.  I saw her as a sick work in. I suspect this is related to low blood pressure as she has lost about 30 pounds in weight since being on Mounjaro.  Blood pressure is very soft.  Do not suspect cardiac etiology for her syncope as she clearly has premonitory symptoms.  We will reduce the dose of Entresto from max dose to medium dose.  Advised her to hold the medication if her systolic blood pressure is 90 mmHg or less.  For now we will continue beta-blocker therapy as previously she had frequent PVCs on lower dose.  Reviewed her echocardiogram, no change in LVEF.  Unfortunately, no change in LVEF in spite of guideline directed medical therapy.  However discussed with her that she has not had any hospitalization and there is no acute decompensated heart failure by either physical exam or by her clinical presentation.  Hence continue guideline directed medical therapy.  Her husband is present and all questions answered.  I would like to see him back in 3 months for follow-up.  No indication for an ICD.   I spent 39 minutes with the patient and her husband.   Adrian Prows, MD, [facility] 01/02/2022, 9:17 PM Office: [phone]

## 2022-01-03 ENCOUNTER — Encounter: Payer: Self-pay | Admitting: Cardiology

## 2022-01-03 NOTE — Telephone Encounter (Signed)
From patient.

## 2022-01-13 ENCOUNTER — Ambulatory Visit
Admission: EM | Admit: 2022-01-13 | Discharge: 2022-01-13 | Disposition: A | Discharge status: Home / Self Care | Payer: No Typology Code available for payment source | Attending: Family Medicine | Admitting: Family Medicine

## 2022-01-13 ENCOUNTER — Encounter: Payer: Self-pay | Admitting: Emergency Medicine

## 2022-01-13 DIAGNOSIS — Z4802 Encounter for removal of sutures: Secondary | ICD-10-CM

## 2022-01-13 NOTE — ED Notes (Signed)
Pt given a glass of water prior to discharge Denies any dizziness

## 2022-01-13 NOTE — ED Triage Notes (Signed)
Here for  staple removal  to head

## 2022-02-04 LAB — HEPATIC FUNCTION PANEL
ALT: 18 U/L (ref 7–35)
AST: 17 (ref 13–35)
Alkaline Phosphatase: 59 (ref 25–125)

## 2022-02-04 LAB — TSH: TSH: 0.85 (ref 0.41–5.90)

## 2022-02-04 LAB — HM PAP SMEAR: HM Pap smear: NEGATIVE

## 2022-02-04 LAB — HEMOGLOBIN A1C: Hemoglobin A1C: 5.8

## 2022-02-04 LAB — BASIC METABOLIC PANEL
BUN: 17 (ref 4–21)
CO2: 22 (ref 13–22)
Chloride: 101 (ref 99–108)
Creatinine: 1.1 (ref 0.5–1.1)
Glucose: 76
Potassium: 4.5 mEq/L (ref 3.5–5.1)
Sodium: 142 (ref 137–147)

## 2022-02-04 LAB — CBC AND DIFFERENTIAL
HCT: 44 (ref 36–46)
Hemoglobin: 13.9 (ref 12.0–16.0)
Neutrophils Absolute: 64
Platelets: 220 10*3/uL (ref 150–400)
WBC: 6.1

## 2022-02-04 LAB — LIPID PANEL
Cholesterol: 222 — AB (ref 0–200)
HDL: 49 (ref 35–70)
LDL Cholesterol: 142
Triglycerides: 170 — AB (ref 40–160)

## 2022-02-04 LAB — CBC: RBC: 5.41 — AB (ref 3.87–5.11)

## 2022-02-04 LAB — COMPREHENSIVE METABOLIC PANEL
Albumin: 4.6 (ref 3.5–5.0)
Calcium: 9.7 (ref 8.7–10.7)
Globulin: 1.9
eGFR: 61

## 2022-02-04 LAB — RESULTS CONSOLE HPV: CHL HPV: NEGATIVE

## 2022-02-05 ENCOUNTER — Encounter: Payer: Self-pay | Admitting: Internal Medicine

## 2022-02-06 ENCOUNTER — Other Ambulatory Visit: Payer: Self-pay | Admitting: Neurological Surgery

## 2022-02-06 ENCOUNTER — Encounter: Payer: Self-pay | Admitting: Cardiology

## 2022-02-07 ENCOUNTER — Encounter (HOSPITAL_COMMUNITY): Payer: Self-pay | Admitting: Neurological Surgery

## 2022-02-07 ENCOUNTER — Other Ambulatory Visit: Payer: No Typology Code available for payment source

## 2022-02-07 ENCOUNTER — Encounter: Payer: Self-pay | Admitting: Internal Medicine

## 2022-02-07 ENCOUNTER — Other Ambulatory Visit: Payer: Self-pay

## 2022-02-07 DIAGNOSIS — E039 Hypothyroidism, unspecified: Secondary | ICD-10-CM

## 2022-02-07 DIAGNOSIS — E7439 Other disorders of intestinal carbohydrate absorption: Secondary | ICD-10-CM

## 2022-02-07 DIAGNOSIS — E1169 Type 2 diabetes mellitus with other specified complication: Secondary | ICD-10-CM

## 2022-02-07 DIAGNOSIS — E782 Mixed hyperlipidemia: Secondary | ICD-10-CM

## 2022-02-07 NOTE — Progress Notes (Signed)
Anesthesia Chart Review: Rebecca [name]  Case: [id number] Date/Time: 02/08/22 0715   Procedure: Left L5-S1 Microdiscectomy w/Metrx (Left: Back) - 3C/RM 20   Anesthesia type: General   Pre-op diagnosis: Lumbar herniated disc   Location: [facility] OR ROOM 20 / [place] [facility]   Surgeons: Kristeen Miss, MD       DISCUSSION: Patient is a 51 year old female scheduled for the above procedure.   History includes former smoker (quit 03/29/05), CAD, HFrEF (attributed to to Adriamycin), left BBB (diagnosed ~ 2013), HLD, HTN, DM2, GERD, hiatal hernia, breast cancer (right IJ [employer] 07/15/17-01/05/19 for chemotherapy, right total mastectomy and left risk reducing mastectomy 11/18/17; bilateral breast reconstruction 06/19/23, silicone implants 42/70/62), hyperthyroidism (s/p RAI therapy 06/2016), hypothyroidism (on levothyroxine).  Last cardiology visit with Dr. Einar Gip on 01/02/22 following an ED visit for syncope on standing in the setting of little oral intake and orthostatic hypotension. Echo repeated and showed persistent severely depressed LVSF with EF 25-30%, dilated, CM, global hypokinesis, grade 1 DD, RVSP 37 mmHg. He reviewed her echo and noted, "Unfortunately, no change in LVEF in spite of guideline directed medical therapy.  However discussed with her that she has not had any hospitalization and there is no acute decompensated heart failure by either physical exam or by her clinical presentation.  Hence continue guideline directed medical therapy."  He did not feel there was any indication for an ICD at that time. 3 month follow-up planned.  Her last few SBP readings has been in the 80-90's, including at her recent visit with Dr. Einar Gip. Dr. Einar Gip composed a letter on 02/06/22 stating, "Thressa Sheller is at low risk, from a cardiac standpoint, for her upcoming procedure: Microdiscectomy (lumbar).  It is ok to proceed without further cardiac testing.  No medications to hold. I will inform patient to hold her Bp  medications and cardiac medications on the day of surgery (done)".    She is a same day work-up. She in on Mounjaro 15 mg weekly injections. She did not receive instructions from surgeon to hold, so her last dose was on Sunday 02/03/22. Surgery is posted for general anesthesia.   VS:  BP Readings from Last 3 Encounters:  01/13/22 (!) 88/60  01/02/22 90/62  01/02/22 (!) 86/66   Pulse Readings from Last 3 Encounters:  01/13/22 86  01/02/22 70  01/02/22 67     PROVIDERS: Elby Showers, MD is PCP  Adrian Prows, MD is cardiologist Nicholas Lose, MD is HEM-ONC   LABS: Last lab results in [facility] include:  Lab Results  Component Value Date   WBC 6.1 02/04/2022   HGB 13.9 02/04/2022   HCT 44 02/04/2022   PLT 220 02/04/2022   GLUCOSE 91 09/04/2021   ALT 18 02/04/2022   AST 17 02/04/2022   NA 142 02/04/2022   K 4.5 02/04/2022   CL 101 02/04/2022   CREATININE 1.1 02/04/2022   BUN 17 02/04/2022   CO2 22 02/04/2022   TSH 0.85 02/04/2022   HGBA1C 5.8 02/04/2022    IMAGES: CT Head/C-spine 01/01/22: IMPRESSION: 1. No acute intracranial abnormality. 2. No acute/traumatic cervical spine pathology.  MRI L-spine 01/15/21:  IMPRESSION: - Lumbar spondylosis, as outlined and with findings most notably as follows. - At L4-L5, there is 2 mm grade 1 anterolisthesis with slight disc uncovering. Moderate facet arthrosis with ligamentum flavum hypertrophy. Bilateral subarticular narrowing (with potential to affect either descending L5 nerve root). Mild bilateral neural foraminal narrowing. - At L5-S1, there is a disc  bulge. Superimposed broad-based central disc protrusion, eccentric to the left. Mild facet arthrosis. The disc protrusion contributes to mild left subarticular stenosis, and likely contacts the descending left S1 nerve root. - No significant spinal canal or foraminal stenosis at the remaining levels. - Mild edema within the posterior elements on the left at L5,  likely degenerative and related to facet arthrosis.   EKG: 01/02/22:  Sinus Rhythm  -Left bundle branch block. No significant change from 07/14/2019.  CV: Echocardiogram 01/02/2022:  Severely depressed LV systolic function with visual EF 25-30%. Left  ventricle cavity is minimally dilated. Dilated cardiomyopathy. Hypokinetic  global wall motion. Abnormal septal wall motion due to left bundle branch  block. Doppler evidence of grade I (impaired) diastolic dysfunction,  normal LAP.  Left atrial cavity is mildly dilated.  Structurally normal tricuspid valve with trace regurgitation. Mild  pulmonary hypertension. RVSP measures 37 mmHg.  Compared to 5/20232, no significant changes.  - Comparison 08/27/21: LVEF 25-30%, dilated CM, hypokinetic global wall motion, abnormal septal wall motion due to LBBB, grade 1 DD, trace TR, mild pulmonary hypertension, RVSP 37 mmHg, 09/20/20 LVEF 47%   Extended EKG monitoring by Zio patch 2 days and 22 hours starting 08/27/2021: Predominant rhythm is normal sinus rhythm with bundle branch block.  There were 2 patient activated events correlating with supraventricular and ventricular ectopics.  Rare PACs and rare PVCs, PVC burden <1%.   Coronary CTA 01/21/2019: 1. Coronary artery calcium score 50 Agatston units. This places the patient in the 97th percentile for age and gender, suggesting high risk for future cardiac events. 2.  Nonobstructive coronary disease. 3. The RCA originates from the left cusp in close proximity to the left coronary and courses interarterially between the aortic root and proximal PA to reach typical RCA territory. Would consider treadmill stress testing to assess for provocative ischemia. 4. LAD system: Mixed plaque in the proximal LAD with mild (<50%) stenosis. Circumflex system: Large PLOM, small AV LCx after take-off of PLOM. No plaque or stenosis.  Past Medical History:  Diagnosis Date   Allergy    constant hives post chemo therapy.   All allergen tests have been neg.   Anxiety    Breast cancer ([place])    Coronary artery disease    Diabetes mellitus without complication ([place])    Diverticulitis    GERD (gastroesophageal reflux disease)    Hiatal hernia    Hyperlipidemia    Hypertension    Hypothyroidism    LBBB (left bundle branch block)    Pneumonia     Past Surgical History:  Procedure Laterality Date   BREAST RECONSTRUCTION WITH PLACEMENT OF TISSUE EXPANDER AND ALLODERM Bilateral 11/18/2017   Procedure: BILATERAL BREAST RECONSTRUCTION WITH PLACEMENT OF TISSUE EXPANDER AND ALLODERM;  Surgeon: Irene Limbo, MD;  Location: [place];  Service: Plastics;  Laterality: Bilateral;   CESAREAN SECTION  2007/2010   x2   COLONOSCOPY  2017   LAPAROSCOPIC TUBAL LIGATION  06/12/2011   Procedure: LAPAROSCOPIC TUBAL LIGATION;  Surgeon: Daria Pastures, MD;  Location: [facility];  Service: Gynecology;  Laterality: N/A;  filshie clip   LIPOSUCTION WITH LIPOFILLING Bilateral 02/24/2018   Procedure: LIPOFILLING FROM ABDOMEN TO BILATERAL CHEST;  Surgeon: Irene Limbo, MD;  Location: [place];  Service: Plastics;  Laterality: Bilateral;   MASTECTOMY W/ SENTINEL NODE BIOPSY Bilateral 11/18/2017   Procedure: BILATERAL TOTAL MASTECTOMIES WITH RIGHT SENTINEL LYMPH NODE BIOPSY;  Surgeon: Rolm Bookbinder, MD;  Location: [place];  Service: General;  Laterality: Bilateral;   mini tuck  2012   abdominal   PORT-A-CATH REMOVAL Right 01/05/2019   PORTACATH PLACEMENT Right 07/15/2017   Procedure: INSERTION PORT-A-CATH WITH ULTRASOUND;  Surgeon: Rolm Bookbinder, MD;  Location: [place];  Service: General;  Laterality: Right;   REMOVAL OF BILATERAL TISSUE EXPANDERS WITH PLACEMENT OF BILATERAL BREAST IMPLANTS Bilateral 02/24/2018   Procedure: REMOVAL OF BILATERAL TISSUE EXPANDERS WITH PLACEMENT OF BILATERAL BREAST IMPLANTS;  Surgeon: Irene Limbo, MD;  Location: [place];  Service: Plastics;  Laterality: Bilateral;   right breast cancer     upper-outer quadrant    right ear surg  03/2008   Mohs   TUBAL LIGATION     US ECHOCARDIOGRAPHY  12-30-2007   EF 55-60%   WISDOM TOOTH EXTRACTION      MEDICATIONS: No current facility-administered medications for this encounter.    Ascorbic Acid (VITAMIN C PO)   BD INSULIN SYRINGE U/F 31G X 5/16" 0.5 ML MISC   BD PEN NEEDLE NANO 2ND GEN 32G X 4 MM MISC   carvedilol (COREG) 12.5 MG tablet   ENTRESTO 97-103 MG   EPINEPHrine 0.3 mg/0.3 mL IJ SOAJ injection   esomeprazole (NEXIUM) 40 MG capsule   furosemide (LASIX) 40 MG tablet   ibuprofen (ADVIL) 200 MG tablet   levothyroxine (SYNTHROID) 150 MCG tablet   LORazepam (ATIVAN) 1 MG tablet   montelukast (SINGULAIR) 10 MG tablet   Naproxen Sodium (ALEVE) 220 MG CAPS   pregabalin (LYRICA) 75 MG capsule   testosterone cypionate (DEPO-TESTOSTERONE) 200 MG/ML injection   tirzepatide (MOUNJARO) 15 MG/0.5ML Pen   TRESIBA FLEXTOUCH 100 UNIT/ML FlexTouch Pen   VAGIFEM 10 MCG TABS vaginal tablet   JARDIANCE 10 MG TABS tablet    Myra Gianotti, PA-C Surgical Short Stay/Anesthesiology [facility] Phone [phone] [facility] Phone [phone] 02/07/2022 1:55 PM

## 2022-02-07 NOTE — Anesthesia Preprocedure Evaluation (Addendum)
Anesthesia Evaluation  Patient identified by MRN, date of birth, ID band Patient awake    Reviewed: Allergy & Precautions, Patient's Chart, lab work & pertinent test results  History of Anesthesia Complications Negative for: history of anesthetic complications  Airway Mallampati: II  TM Distance: >3 FB Neck ROM: Full    Dental no notable dental hx.    Pulmonary former smoker   Pulmonary exam normal        Cardiovascular hypertension, Pt. on medications and Pt. on home beta blockers + CAD   Rhythm:Regular Rate:Normal  Echocardiogram 01/02/2022:  Severely depressed LV systolic function with visual EF 25-30%. Left  ventricle cavity is minimally dilated. Dilated cardiomyopathy. Hypokinetic  global wall motion. Abnormal septal wall motion due to left bundle branch  block. Doppler evidence of grade I (impaired) diastolic dysfunction,  normal LAP.  Left atrial cavity is mildly dilated.  Structurally normal tricuspid valve with trace regurgitation. Mild  pulmonary hypertension. RVSP measures 37 mmHg.  Compared to 5/20232, no significant changes.  - Comparison 08/27/21: LVEF 25-30%, dilated CM, hypokinetic global wall motion, abnormal septal wall motion due to LBBB, grade 1 DD, trace TR, mild pulmonary hypertension, RVSP 37 mmHg, 09/20/20 LVEF 47%   Extended EKG monitoring by Zio patch 2 days and 22 hours starting 08/27/2021: Predominant rhythm is normal sinus rhythm with bundle branch block. There were 2 patient activated events correlating with supraventricular and ventricular ectopics. Rare PACs and rare PVCs, PVC burden <1%.   Coronary CTA 01/21/2019: 1. Coronary artery calcium score 50 Agatston units. This places the patient in the 97th percentile for age and gender, suggesting high risk for future cardiac events. 2. Nonobstructive coronary disease. 3. The RCA originates from the left cusp in close proximity to the left  coronary and courses interarterially between the aortic root and proximal PA to reach typical RCA territory. Would consider treadmill stress testing to assess for provocative ischemia. 4. LAD system: Mixed plaque in the proximal LAD with mild (<50%) stenosis. Circumflex system: Large PLOM, small AV LCx after take-off of PLOM. No plaque or stenosis.    Neuro/Psych  PSYCHIATRIC DISORDERS Anxiety     negative neurological ROS     GI/Hepatic Neg liver ROS, hiatal hernia,GERD  Medicated,,  Endo/Other  diabetesHypothyroidism    Renal/GU negative Renal ROS  negative genitourinary   Musculoskeletal negative musculoskeletal ROS (+)    Abdominal Normal abdominal exam  (+)   Peds  Hematology negative hematology ROS (+)   Anesthesia Other Findings Dr. Einar Gip composed a letter on 02/06/22 stating, "Rebecca [name] is at low risk, from a cardiac standpoint, for her upcoming procedure: Microdiscectomy (lumbar).  It is ok to proceed without further cardiac testing.   Reproductive/Obstetrics                             Anesthesia Physical Anesthesia Plan  ASA: 4  Anesthesia Plan: General   Post-op Pain Management: Celebrex PO (pre-op)* and Tylenol PO (pre-op)*   Induction: Intravenous  PONV Risk Score and Plan: 4 or greater and Ondansetron, Dexamethasone, Midazolam and Scopolamine patch - Pre-op  Airway Management Planned: Oral ETT and Mask  Additional Equipment: Arterial line  Intra-op Plan:   Post-operative Plan: Extubation in OR  Informed Consent: I have reviewed the patients History and Physical, chart, labs and discussed the procedure including the risks, benefits and alternatives for the proposed anesthesia with the patient or authorized representative who has indicated his/her understanding  and acceptance.     Dental advisory given  Plan Discussed with: Anesthesiologist  Anesthesia Plan Comments: (PAT note written 02/07/2022 by Myra Gianotti, PA-C. SAME DAY WORK-UP. Last [facility] 02/03/22. Cardiologist told her to hold BP medication on the morning of surgery (recent hypotension).   Lab Results      Component                Value               Date                      WBC                      6.1                 02/04/2022                HGB                      13.9                02/04/2022                HCT                      44                  02/04/2022                MCV                      81                  08/23/2021                PLT                      220                 02/04/2022            Lab Results      Component                Value               Date                      NA                       142                 02/04/2022                K                        4.5                 02/04/2022                CO2                      22                  02/04/2022  GLUCOSE                  91                  09/04/2021                BUN                      17                  02/04/2022                CREATININE               1.1                 02/04/2022                CALCIUM                  9.7                 02/04/2022                EGFR                     61                  02/04/2022                GFRNONAA                 60 (L)              01/08/2021            )        Anesthesia Quick Evaluation

## 2022-02-07 NOTE — Progress Notes (Signed)
PCP - Tedra Senegal, MD Cardiologist - Adrian Prows, MD  PPM/ICD - denies  Chest x-ray - 12/28/19 EKG - 01/02/22 ECHO - 01/02/22 Cardiac Cath - denies  CPAP - n/a  Fasting Blood Sugar - patient is checking CBG randomly  Blood Thinner Instructions: n/a  Aspirin Instructions: Patient was instructed: As of today, STOP taking any Aspirin (unless otherwise instructed by your surgeon) Aleve, Naproxen, Ibuprofen, Motrin, Advil, Goody's, BC's, all herbal medications, fish oil, and all vitamins.  Patient verbalized that the last dose of Mounjaro was on 02/03/22  ERAS Protcol - n/a  COVID TEST- n/a  Anesthesia review: yes - cardiac history  Patient verbally denies any shortness of breath, fever, cough and chest pain during phone call   -------------  SDW INSTRUCTIONS given:  Your procedure is scheduled on Friday, October 13th, 2023.  Report to [facility] Main Entrance "A" at 0530 A.M., and check in at the Admitting office.  Call this number if you have problems the morning of surgery:  [phone]   Remember:  Do not eat or drink after midnight the night before your surgery    Take these medicines the morning of surgery with A SIP OF WATER Synthroid, Lyrica PRN: Epinephrine.   Per Dr. Einar Gip - I will inform patient to hold her Bp medications and cardiac medications on the day of surgery (done)  The night before surgery take TRESHIBA 50 % of your regular dose.  How do I manage my blood sugar before surgery? Check your blood sugar at least 4 times a day, starting 2 days before surgery, to make sure that the level is not too high or low.  Check your blood sugar the morning of your surgery when you wake up and every 2 hours until you get to the Short Stay unit.  If your blood sugar is less than 70 mg/dL, you will need to treat for low blood sugar: Do not take insulin. Treat a low blood sugar (less than 70 mg/dL) with  cup of clear juice (cranberry or apple), 4 glucose tablets, OR  glucose gel. Recheck blood sugar in 15 minutes after treatment (to make sure it is greater than 70 mg/dL). If your blood sugar is not greater than 70 mg/dL on recheck, call [phone] for further instructions. Report your blood sugar to the short stay nurse when you get to Short Stay.   The day of surgery:                     Do not wear jewelry, make up, or nail polish            Do not wear lotions, powders, perfumes, or deodorant.            Do not shave 48 hours prior to surgery.              Do not bring valuables to the hospital.            [facility] is not responsible for any belongings or valuables.  Do NOT Smoke (Tobacco/Vaping) 24 hours prior to your procedure If you use a CPAP at night, you may bring all equipment for your overnight stay.   Contacts, glasses, dentures or bridgework may not be worn into surgery.      For patients admitted to the hospital, discharge time will be determined by your treatment team.   Patients discharged the day of surgery will not be allowed to drive home, and someone needs to  stay with them for 24 hours.    Special instructions:   - Preparing For Surgery  Before surgery, you can play an important role. Because skin is not sterile, your skin needs to be as free of germs as possible. You can reduce the number of germs on your skin by washing with CHG (chlorahexidine gluconate) Soap before surgery.  CHG is an antiseptic cleaner which kills germs and bonds with the skin to continue killing germs even after washing.    Oral Hygiene is also important to reduce your risk of infection.  Remember - BRUSH YOUR TEETH THE MORNING OF SURGERY WITH YOUR REGULAR TOOTHPASTE  Please do not use if you have an allergy to CHG or antibacterial soaps. If your skin becomes reddened/irritated stop using the CHG.  Do not shave (including legs and underarms) for at least 48 hours prior to first CHG shower. It is OK to shave your face.  Please follow  these instructions carefully.   Shower the NIGHT BEFORE SURGERY and the MORNING OF SURGERY with DIAL Soap.   Pat yourself dry with a CLEAN TOWEL.  Wear CLEAN PAJAMAS to bed the night before surgery  Place CLEAN SHEETS on your bed the night of your first shower and DO NOT SLEEP WITH PETS.   Day of Surgery: Please shower morning of surgery  Wear Clean/Comfortable clothing the morning of surgery Do not apply any deodorants/lotions.   Remember to brush your teeth WITH YOUR REGULAR TOOTHPASTE.   Questions were answered. Patient verbalized understanding of instructions.

## 2022-02-08 ENCOUNTER — Ambulatory Visit (HOSPITAL_BASED_OUTPATIENT_CLINIC_OR_DEPARTMENT_OTHER): Payer: No Typology Code available for payment source | Admitting: Vascular Surgery

## 2022-02-08 ENCOUNTER — Encounter (HOSPITAL_COMMUNITY)
Admission: RE | Disposition: A | Discharge status: Home / Self Care | Payer: Self-pay | Source: Home / Self Care | Attending: Neurological Surgery

## 2022-02-08 ENCOUNTER — Ambulatory Visit (HOSPITAL_COMMUNITY): Payer: No Typology Code available for payment source

## 2022-02-08 ENCOUNTER — Ambulatory Visit: Payer: No Typology Code available for payment source | Admitting: Internal Medicine

## 2022-02-08 ENCOUNTER — Ambulatory Visit (HOSPITAL_COMMUNITY): Payer: No Typology Code available for payment source | Admitting: Vascular Surgery

## 2022-02-08 ENCOUNTER — Observation Stay (HOSPITAL_COMMUNITY)
Admission: RE | Admit: 2022-02-08 | Discharge: 2022-02-09 | Disposition: A | Discharge status: Home / Self Care | Payer: No Typology Code available for payment source | Attending: Neurological Surgery | Admitting: Neurological Surgery

## 2022-02-08 ENCOUNTER — Encounter (HOSPITAL_COMMUNITY): Payer: Self-pay | Admitting: Neurological Surgery

## 2022-02-08 ENCOUNTER — Other Ambulatory Visit: Payer: Self-pay

## 2022-02-08 DIAGNOSIS — Z87891 Personal history of nicotine dependence: Secondary | ICD-10-CM | POA: Diagnosis not present

## 2022-02-08 DIAGNOSIS — M5416 Radiculopathy, lumbar region: Secondary | ICD-10-CM | POA: Diagnosis not present

## 2022-02-08 DIAGNOSIS — E119 Type 2 diabetes mellitus without complications: Secondary | ICD-10-CM | POA: Insufficient documentation

## 2022-02-08 DIAGNOSIS — I110 Hypertensive heart disease with heart failure: Secondary | ICD-10-CM | POA: Insufficient documentation

## 2022-02-08 DIAGNOSIS — I509 Heart failure, unspecified: Secondary | ICD-10-CM | POA: Insufficient documentation

## 2022-02-08 DIAGNOSIS — I2510 Atherosclerotic heart disease of native coronary artery without angina pectoris: Secondary | ICD-10-CM | POA: Insufficient documentation

## 2022-02-08 DIAGNOSIS — Z79899 Other long term (current) drug therapy: Secondary | ICD-10-CM | POA: Diagnosis not present

## 2022-02-08 DIAGNOSIS — Z794 Long term (current) use of insulin: Secondary | ICD-10-CM | POA: Insufficient documentation

## 2022-02-08 DIAGNOSIS — E039 Hypothyroidism, unspecified: Secondary | ICD-10-CM | POA: Diagnosis not present

## 2022-02-08 DIAGNOSIS — Z853 Personal history of malignant neoplasm of breast: Secondary | ICD-10-CM | POA: Insufficient documentation

## 2022-02-08 DIAGNOSIS — M5126 Other intervertebral disc displacement, lumbar region: Secondary | ICD-10-CM | POA: Diagnosis present

## 2022-02-08 DIAGNOSIS — M5117 Intervertebral disc disorders with radiculopathy, lumbosacral region: Secondary | ICD-10-CM

## 2022-02-08 DIAGNOSIS — I10 Essential (primary) hypertension: Secondary | ICD-10-CM

## 2022-02-08 DIAGNOSIS — M5127 Other intervertebral disc displacement, lumbosacral region: Secondary | ICD-10-CM | POA: Diagnosis present

## 2022-02-08 HISTORY — PX: LUMBAR LAMINECTOMY/ DECOMPRESSION WITH MET-RX: SHX5959

## 2022-02-08 LAB — GLUCOSE, CAPILLARY
Glucose-Capillary: 96 mg/dL (ref 70–99)
Glucose-Capillary: 108 mg/dL — ABNORMAL HIGH (ref 70–99)
Glucose-Capillary: 117 mg/dL — ABNORMAL HIGH (ref 70–99)
Glucose-Capillary: 129 mg/dL — ABNORMAL HIGH (ref 70–99)

## 2022-02-08 LAB — SURGICAL PCR SCREEN
MRSA, PCR: NEGATIVE
Staphylococcus aureus: NEGATIVE

## 2022-02-08 SURGERY — LUMBAR LAMINECTOMY/ DECOMPRESSION WITH MET-RX
Anesthesia: General | Site: Back | Laterality: Left

## 2022-02-08 MED ORDER — ONDANSETRON HCL 4 MG PO TABS
4.0000 mg | ORAL_TABLET | Freq: Four times a day (QID) | ORAL | Status: DC | PRN
  Administered 2022-02-08: 4 mg via ORAL
  Filled 2022-02-08: qty 1

## 2022-02-08 MED ORDER — DOCUSATE SODIUM 100 MG PO CAPS
100.0000 mg | ORAL_CAPSULE | Freq: Two times a day (BID) | ORAL | Status: DC
  Administered 2022-02-08 (×2): 100 mg via ORAL
  Filled 2022-02-08 (×2): qty 1

## 2022-02-08 MED ORDER — LACTATED RINGERS IV SOLN: INTRAVENOUS | Status: DC

## 2022-02-08 MED ORDER — SODIUM CHLORIDE 0.9% FLUSH
3.0000 mL | Freq: Two times a day (BID) | INTRAVENOUS | Status: DC
  Administered 2022-02-08 (×2): 3 mL via INTRAVENOUS

## 2022-02-08 MED ORDER — MONTELUKAST SODIUM 10 MG PO TABS
10.0000 mg | ORAL_TABLET | Freq: Every day | ORAL | Status: DC
  Administered 2022-02-08: 10 mg via ORAL
  Filled 2022-02-08: qty 1

## 2022-02-08 MED ORDER — KETAMINE HCL 10 MG/ML IJ SOLN
INTRAMUSCULAR | Status: DC | PRN
  Administered 2022-02-08 (×2): 25 mg via INTRAVENOUS

## 2022-02-08 MED ORDER — INSULIN ASPART 100 UNIT/ML IJ SOLN: 0.0000 [IU] | Freq: Three times a day (TID) | INTRAMUSCULAR | Status: DC

## 2022-02-08 MED ORDER — PROPOFOL 10 MG/ML IV BOLUS
INTRAVENOUS | Status: AC
  Filled 2022-02-08: qty 20

## 2022-02-08 MED ORDER — EPHEDRINE 5 MG/ML INJ
INTRAVENOUS | Status: AC
  Filled 2022-02-08: qty 5

## 2022-02-08 MED ORDER — PANTOPRAZOLE SODIUM 40 MG PO TBEC
40.0000 mg | DELAYED_RELEASE_TABLET | Freq: Every day | ORAL | Status: DC
  Administered 2022-02-08: 40 mg via ORAL
  Filled 2022-02-08: qty 1

## 2022-02-08 MED ORDER — PREGABALIN 75 MG PO CAPS
75.0000 mg | ORAL_CAPSULE | Freq: Two times a day (BID) | ORAL | Status: DC
  Administered 2022-02-08 (×2): 75 mg via ORAL
  Filled 2022-02-08 (×2): qty 1

## 2022-02-08 MED ORDER — BUPIVACAINE HCL (PF) 0.5 % IJ SOLN
INTRAMUSCULAR | Status: AC
  Filled 2022-02-08: qty 30

## 2022-02-08 MED ORDER — SODIUM CHLORIDE 0.9% FLUSH: 3.0000 mL | INTRAVENOUS | Status: DC | PRN

## 2022-02-08 MED ORDER — THROMBIN 5000 UNITS EX SOLR
OROMUCOSAL | Status: DC | PRN
  Administered 2022-02-08: 5 mL via TOPICAL

## 2022-02-08 MED ORDER — ROCURONIUM BROMIDE 10 MG/ML (PF) SYRINGE
PREFILLED_SYRINGE | INTRAVENOUS | Status: AC
  Filled 2022-02-08: qty 10

## 2022-02-08 MED ORDER — CARVEDILOL 12.5 MG PO TABS
12.5000 mg | ORAL_TABLET | Freq: Once | ORAL | Status: AC
  Administered 2022-02-08: 12.5 mg via ORAL
  Filled 2022-02-08: qty 1

## 2022-02-08 MED ORDER — PROMETHAZINE HCL 25 MG/ML IJ SOLN: 6.2500 mg | INTRAMUSCULAR | Status: DC | PRN

## 2022-02-08 MED ORDER — SACUBITRIL-VALSARTAN 97-103 MG PO TABS
1.0000 | ORAL_TABLET | Freq: Two times a day (BID) | ORAL | Status: DC
  Administered 2022-02-08 (×2): 1 via ORAL
  Filled 2022-02-08 (×3): qty 1

## 2022-02-08 MED ORDER — CHLORHEXIDINE GLUCONATE CLOTH 2 % EX PADS: 6.0000 | MEDICATED_PAD | Freq: Once | CUTANEOUS | Status: DC

## 2022-02-08 MED ORDER — ACETAMINOPHEN 325 MG PO TABS: 650.0000 mg | ORAL_TABLET | ORAL | Status: DC | PRN

## 2022-02-08 MED ORDER — FENTANYL CITRATE (PF) 250 MCG/5ML IJ SOLN
INTRAMUSCULAR | Status: DC | PRN
  Administered 2022-02-08: 50 ug via INTRAVENOUS
  Administered 2022-02-08 (×2): 100 ug via INTRAVENOUS

## 2022-02-08 MED ORDER — CEFAZOLIN SODIUM-DEXTROSE 2-4 GM/100ML-% IV SOLN
2.0000 g | INTRAVENOUS | Status: AC
  Administered 2022-02-08: 2 g via INTRAVENOUS
  Filled 2022-02-08: qty 100

## 2022-02-08 MED ORDER — MENTHOL 3 MG MT LOZG: 1.0000 | LOZENGE | OROMUCOSAL | Status: DC | PRN

## 2022-02-08 MED ORDER — DIAZEPAM 5 MG PO TABS: 5.0000 mg | ORAL_TABLET | Freq: Four times a day (QID) | ORAL | 0 refills | Status: DC | PRN

## 2022-02-08 MED ORDER — KETAMINE HCL 50 MG/5ML IJ SOSY
PREFILLED_SYRINGE | INTRAMUSCULAR | Status: AC
  Filled 2022-02-08: qty 5

## 2022-02-08 MED ORDER — DEXAMETHASONE SODIUM PHOSPHATE 10 MG/ML IJ SOLN
INTRAMUSCULAR | Status: DC | PRN
  Administered 2022-02-08: 6 mg via INTRAVENOUS

## 2022-02-08 MED ORDER — GLYCOPYRROLATE PF 0.2 MG/ML IJ SOSY
PREFILLED_SYRINGE | INTRAMUSCULAR | Status: AC
  Filled 2022-02-08: qty 1

## 2022-02-08 MED ORDER — ACETAMINOPHEN 650 MG RE SUPP: 650.0000 mg | RECTAL | Status: DC | PRN

## 2022-02-08 MED ORDER — ACETAMINOPHEN 500 MG PO TABS: 1000.0000 mg | ORAL_TABLET | Freq: Once | ORAL | Status: DC

## 2022-02-08 MED ORDER — THROMBIN 5000 UNITS EX SOLR
CUTANEOUS | Status: AC
  Filled 2022-02-08: qty 5000

## 2022-02-08 MED ORDER — METHOCARBAMOL 1000 MG/10ML IJ SOLN
500.0000 mg | Freq: Four times a day (QID) | INTRAVENOUS | Status: DC | PRN
  Filled 2022-02-08: qty 5

## 2022-02-08 MED ORDER — PHENYLEPHRINE HCL-NACL 20-0.9 MG/250ML-% IV SOLN
INTRAVENOUS | Status: DC | PRN
  Administered 2022-02-08: 40 ug/min via INTRAVENOUS

## 2022-02-08 MED ORDER — CARVEDILOL 12.5 MG PO TABS
12.5000 mg | ORAL_TABLET | Freq: Two times a day (BID) | ORAL | Status: DC
  Filled 2022-02-08: qty 1

## 2022-02-08 MED ORDER — BISACODYL 10 MG RE SUPP: 10.0000 mg | Freq: Every day | RECTAL | Status: DC | PRN

## 2022-02-08 MED ORDER — POLYETHYLENE GLYCOL 3350 17 G PO PACK: 17.0000 g | PACK | Freq: Every day | ORAL | Status: DC | PRN

## 2022-02-08 MED ORDER — FENTANYL CITRATE (PF) 100 MCG/2ML IJ SOLN: 25.0000 ug | INTRAMUSCULAR | Status: DC | PRN

## 2022-02-08 MED ORDER — 0.9 % SODIUM CHLORIDE (POUR BTL) OPTIME
TOPICAL | Status: DC | PRN
  Administered 2022-02-08: 1000 mL

## 2022-02-08 MED ORDER — FLEET ENEMA 7-19 GM/118ML RE ENEM: 1.0000 | ENEMA | Freq: Once | RECTAL | Status: DC | PRN

## 2022-02-08 MED ORDER — SODIUM CHLORIDE 0.9 % IV SOLN
250.0000 mL | INTRAVENOUS | Status: DC
  Administered 2022-02-08: 250 mL via INTRAVENOUS

## 2022-02-08 MED ORDER — METHOCARBAMOL 500 MG PO TABS
500.0000 mg | ORAL_TABLET | Freq: Four times a day (QID) | ORAL | Status: DC | PRN
  Administered 2022-02-08 (×2): 500 mg via ORAL
  Filled 2022-02-08 (×2): qty 1

## 2022-02-08 MED ORDER — AMISULPRIDE (ANTIEMETIC) 5 MG/2ML IV SOLN
10.0000 mg | Freq: Once | INTRAVENOUS | Status: AC | PRN
  Administered 2022-02-08: 10 mg via INTRAVENOUS

## 2022-02-08 MED ORDER — EMPAGLIFLOZIN 10 MG PO TABS: 10.0000 mg | ORAL_TABLET | Freq: Every day | ORAL | Status: DC

## 2022-02-08 MED ORDER — OXYCODONE-ACETAMINOPHEN 5-325 MG PO TABS
1.0000 | ORAL_TABLET | ORAL | Status: DC | PRN
  Administered 2022-02-08 – 2022-02-09 (×4): 2 via ORAL
  Filled 2022-02-08 (×4): qty 2

## 2022-02-08 MED ORDER — ALUM & MAG HYDROXIDE-SIMETH 200-200-20 MG/5ML PO SUSP: 30.0000 mL | Freq: Four times a day (QID) | ORAL | Status: DC | PRN

## 2022-02-08 MED ORDER — OXYCODONE-ACETAMINOPHEN 5-325 MG PO TABS: 1.0000 | ORAL_TABLET | ORAL | 0 refills | Status: DC | PRN

## 2022-02-08 MED ORDER — LIDOCAINE-EPINEPHRINE 1 %-1:100000 IJ SOLN
INTRAMUSCULAR | Status: DC | PRN
  Administered 2022-02-08: 7 mL

## 2022-02-08 MED ORDER — BUPIVACAINE HCL (PF) 0.5 % IJ SOLN
INTRAMUSCULAR | Status: DC | PRN
  Administered 2022-02-08: 20 mL

## 2022-02-08 MED ORDER — ONDANSETRON HCL 4 MG/2ML IJ SOLN
INTRAMUSCULAR | Status: AC
  Filled 2022-02-08: qty 2

## 2022-02-08 MED ORDER — MIDAZOLAM HCL 2 MG/2ML IJ SOLN
INTRAMUSCULAR | Status: AC
  Filled 2022-02-08: qty 2

## 2022-02-08 MED ORDER — SUCCINYLCHOLINE CHLORIDE 200 MG/10ML IV SOSY
PREFILLED_SYRINGE | INTRAVENOUS | Status: DC | PRN
  Administered 2022-02-08: 100 mg via INTRAVENOUS

## 2022-02-08 MED ORDER — LIDOCAINE 2% (20 MG/ML) 5 ML SYRINGE
INTRAMUSCULAR | Status: DC | PRN
  Administered 2022-02-08: 40 mg via INTRAVENOUS

## 2022-02-08 MED ORDER — ONDANSETRON HCL 4 MG/2ML IJ SOLN
INTRAMUSCULAR | Status: DC | PRN
  Administered 2022-02-08: 4 mg via INTRAVENOUS

## 2022-02-08 MED ORDER — ORAL CARE MOUTH RINSE: 15.0000 mL | Freq: Once | OROMUCOSAL | Status: AC

## 2022-02-08 MED ORDER — SUGAMMADEX SODIUM 200 MG/2ML IV SOLN
INTRAVENOUS | Status: DC | PRN
  Administered 2022-02-08 (×2): 200 mg via INTRAVENOUS

## 2022-02-08 MED ORDER — ONDANSETRON HCL 4 MG/2ML IJ SOLN: 4.0000 mg | Freq: Four times a day (QID) | INTRAMUSCULAR | Status: DC | PRN

## 2022-02-08 MED ORDER — PHENOL 1.4 % MT LIQD: 1.0000 | OROMUCOSAL | Status: DC | PRN

## 2022-02-08 MED ORDER — EPHEDRINE SULFATE-NACL 50-0.9 MG/10ML-% IV SOSY
PREFILLED_SYRINGE | INTRAVENOUS | Status: DC | PRN
  Administered 2022-02-08: 10 mg via INTRAVENOUS

## 2022-02-08 MED ORDER — LIDOCAINE-EPINEPHRINE 1 %-1:100000 IJ SOLN
INTRAMUSCULAR | Status: AC
  Filled 2022-02-08: qty 1

## 2022-02-08 MED ORDER — DEXAMETHASONE SODIUM PHOSPHATE 10 MG/ML IJ SOLN
INTRAMUSCULAR | Status: AC
  Filled 2022-02-08: qty 1

## 2022-02-08 MED ORDER — LIDOCAINE 2% (20 MG/ML) 5 ML SYRINGE
INTRAMUSCULAR | Status: AC
  Filled 2022-02-08: qty 5

## 2022-02-08 MED ORDER — LIDOCAINE-EPINEPHRINE (PF) 1 %-1:200000 IJ SOLN
INTRAMUSCULAR | Status: DC | PRN
  Administered 2022-02-08: 3 mL via INTRAMUSCULAR

## 2022-02-08 MED ORDER — SCOPOLAMINE 1 MG/3DAYS TD PT72
1.0000 | MEDICATED_PATCH | TRANSDERMAL | Status: DC
  Administered 2022-02-08: 1.5 mg via TRANSDERMAL
  Filled 2022-02-08: qty 1

## 2022-02-08 MED ORDER — CARVEDILOL 12.5 MG PO TABS
ORAL_TABLET | ORAL | Status: AC
  Filled 2022-02-08: qty 1

## 2022-02-08 MED ORDER — LEVOTHYROXINE SODIUM 75 MCG PO TABS
150.0000 ug | ORAL_TABLET | Freq: Every day | ORAL | Status: DC
  Administered 2022-02-09: 150 ug via ORAL
  Filled 2022-02-08: qty 2

## 2022-02-08 MED ORDER — ROCURONIUM BROMIDE 10 MG/ML (PF) SYRINGE
PREFILLED_SYRINGE | INTRAVENOUS | Status: DC | PRN
  Administered 2022-02-08: 50 mg via INTRAVENOUS
  Administered 2022-02-08: 10 mg via INTRAVENOUS

## 2022-02-08 MED ORDER — GLYCOPYRROLATE PF 0.2 MG/ML IJ SOSY
PREFILLED_SYRINGE | INTRAMUSCULAR | Status: DC | PRN
  Administered 2022-02-08: .2 mg via INTRAVENOUS

## 2022-02-08 MED ORDER — SENNA 8.6 MG PO TABS
1.0000 | ORAL_TABLET | Freq: Two times a day (BID) | ORAL | Status: DC
  Administered 2022-02-08 (×2): 8.6 mg via ORAL
  Filled 2022-02-08 (×2): qty 1

## 2022-02-08 MED ORDER — LORAZEPAM 0.5 MG PO TABS: 1.0000 mg | ORAL_TABLET | Freq: Every evening | ORAL | Status: DC | PRN

## 2022-02-08 MED ORDER — MORPHINE SULFATE (PF) 2 MG/ML IV SOLN: 2.0000 mg | INTRAVENOUS | Status: DC | PRN

## 2022-02-08 MED ORDER — FENTANYL CITRATE (PF) 250 MCG/5ML IJ SOLN
INTRAMUSCULAR | Status: AC
  Filled 2022-02-08: qty 5

## 2022-02-08 MED ORDER — CELECOXIB 200 MG PO CAPS
200.0000 mg | ORAL_CAPSULE | Freq: Once | ORAL | Status: AC
  Administered 2022-02-08: 200 mg via ORAL
  Filled 2022-02-08: qty 1

## 2022-02-08 MED ORDER — MIDAZOLAM HCL 2 MG/2ML IJ SOLN
INTRAMUSCULAR | Status: DC | PRN
  Administered 2022-02-08: 2 mg via INTRAVENOUS

## 2022-02-08 MED ORDER — PROPOFOL 10 MG/ML IV BOLUS
INTRAVENOUS | Status: DC | PRN
  Administered 2022-02-08 (×2): 50 mg via INTRAVENOUS

## 2022-02-08 MED ORDER — FUROSEMIDE 40 MG PO TABS: 40.0000 mg | ORAL_TABLET | Freq: Every day | ORAL | Status: DC | PRN

## 2022-02-08 MED ORDER — AMISULPRIDE (ANTIEMETIC) 5 MG/2ML IV SOLN
INTRAVENOUS | Status: AC
  Filled 2022-02-08: qty 4

## 2022-02-08 MED ORDER — SUCCINYLCHOLINE CHLORIDE 200 MG/10ML IV SOSY
PREFILLED_SYRINGE | INTRAVENOUS | Status: AC
  Filled 2022-02-08: qty 10

## 2022-02-08 MED ORDER — CEFAZOLIN SODIUM-DEXTROSE 2-4 GM/100ML-% IV SOLN
2.0000 g | Freq: Three times a day (TID) | INTRAVENOUS | Status: AC
  Administered 2022-02-08 – 2022-02-09 (×2): 2 g via INTRAVENOUS
  Filled 2022-02-08 (×2): qty 100

## 2022-02-08 MED ORDER — CHLORHEXIDINE GLUCONATE 0.12 % MT SOLN
15.0000 mL | Freq: Once | OROMUCOSAL | Status: AC
  Administered 2022-02-08: 15 mL via OROMUCOSAL
  Filled 2022-02-08: qty 15

## 2022-02-08 MED ORDER — CELECOXIB 200 MG PO CAPS: 200.0000 mg | ORAL_CAPSULE | Freq: Once | ORAL | Status: DC

## 2022-02-08 MED ORDER — ACETAMINOPHEN 500 MG PO TABS
1000.0000 mg | ORAL_TABLET | Freq: Once | ORAL | Status: AC
  Administered 2022-02-08: 1000 mg via ORAL
  Filled 2022-02-08: qty 2

## 2022-02-08 SURGICAL SUPPLY — 47 items
ADH SKN CLS APL DERMABOND .7 (GAUZE/BANDAGES/DRESSINGS) ×1
BAG COUNTER SPONGE SURGICOUNT (BAG) ×1 IMPLANT
BAG SPNG CNTER NS LX DISP (BAG) ×2
BAND INSRT 18 STRL LF DISP RB (MISCELLANEOUS) ×2
BAND RUBBER #18 3X1/16 STRL (MISCELLANEOUS) ×2 IMPLANT
BLADE CLIPPER SURG (BLADE) IMPLANT
BLADE SURG 15 STRL LF DISP TIS (BLADE) ×1 IMPLANT
BLADE SURG 15 STRL SS (BLADE) ×1
BUR PRECISION MATCH 2.5 (BURR) ×1 IMPLANT
BUR SURG MATCH HEAD 2.5X12.5 (BUR) IMPLANT
BURR SURG MATCH HEAD 2.5X12.5 (BUR) ×1
CANISTER SUCT 3000ML PPV (MISCELLANEOUS) ×1 IMPLANT
DERMABOND ADVANCED .7 DNX12 (GAUZE/BANDAGES/DRESSINGS) ×1 IMPLANT
DRAPE C-ARM 42X72 X-RAY (DRAPES) ×2 IMPLANT
DRAPE LAPAROTOMY 100X72X124 (DRAPES) ×1 IMPLANT
DRAPE MICROSCOPE SLANT 54X150 (MISCELLANEOUS) ×1 IMPLANT
DURAPREP 6ML APPLICATOR 50/CS (WOUND CARE) ×1 IMPLANT
ELECT BLADE 6.5 EXT (BLADE) ×1 IMPLANT
ELECT REM PT RETURN 9FT ADLT (ELECTROSURGICAL) ×1
ELECTRODE REM PT RTRN 9FT ADLT (ELECTROSURGICAL) ×1 IMPLANT
GAUZE 4X4 16PLY ~~LOC~~+RFID DBL (SPONGE) IMPLANT
GLOVE BIOGEL PI IND STRL 8.5 (GLOVE) ×1 IMPLANT
GLOVE ECLIPSE 8.5 STRL (GLOVE) ×1 IMPLANT
GLOVE EXAM NITRILE XL STR (GLOVE) IMPLANT
GOWN STRL REUS W/ TWL LRG LVL3 (GOWN DISPOSABLE) IMPLANT
GOWN STRL REUS W/ TWL XL LVL3 (GOWN DISPOSABLE) IMPLANT
GOWN STRL REUS W/TWL 2XL LVL3 (GOWN DISPOSABLE) ×1 IMPLANT
GOWN STRL REUS W/TWL LRG LVL3 (GOWN DISPOSABLE)
GOWN STRL REUS W/TWL XL LVL3 (GOWN DISPOSABLE)
HEMOSTAT POWDER KIT SURGIFOAM (HEMOSTASIS) ×1 IMPLANT
KIT BASIN OR (CUSTOM PROCEDURE TRAY) ×1 IMPLANT
KIT TURNOVER KIT B (KITS) ×1 IMPLANT
NDL HYPO 18GX1.5 BLUNT FILL (NEEDLE) IMPLANT
NDL SPNL 20GX3.5 QUINCKE YW (NEEDLE) IMPLANT
NEEDLE HYPO 18GX1.5 BLUNT FILL (NEEDLE) IMPLANT
NEEDLE HYPO 22GX1.5 SAFETY (NEEDLE) ×1 IMPLANT
NEEDLE SPNL 20GX3.5 QUINCKE YW (NEEDLE) IMPLANT
NS IRRIG 1000ML POUR BTL (IV SOLUTION) ×1 IMPLANT
PACK LAMINECTOMY NEURO (CUSTOM PROCEDURE TRAY) ×1 IMPLANT
PAD ARMBOARD 7.5X6 YLW CONV (MISCELLANEOUS) ×3 IMPLANT
SPIKE FLUID TRANSFER (MISCELLANEOUS) ×1 IMPLANT
SUT VIC AB 3-0 SH 8-18 (SUTURE) ×1 IMPLANT
SUT VIC AB 4-0 RB1 18 (SUTURE) ×1 IMPLANT
SYR 5ML LL (SYRINGE) IMPLANT
TOWEL GREEN STERILE (TOWEL DISPOSABLE) ×1 IMPLANT
TOWEL GREEN STERILE FF (TOWEL DISPOSABLE) ×1 IMPLANT
WATER STERILE IRR 1000ML POUR (IV SOLUTION) ×1 IMPLANT

## 2022-02-08 NOTE — Discharge Summary (Signed)
Physician Discharge Summary  Patient ID: Decklyn Hornik MRN: [id number] DOB/AGE: [birth year] 51 y.o.  Admit date: 02/08/2022 Discharge date: 02/09/2022  Admission Diagnoses: Herniated nucleus pulposus L5-S1 left  Discharge Diagnoses: Needed nucleus pulposus L5-S1 left.  Congestive heart failure. Principal Problem:   Herniated nucleus pulposus, lumbar   Discharged Condition: good  Hospital Course: Patient was admitted to undergo surgical decompression which she tolerated well.  She has had good relief of her preoperative pain.  Consults: None  Significant Diagnostic Studies: None  Treatments: surgery: See op note  Discharge Exam: Blood pressure 109/79, pulse 77, temperature 97.8 F (36.6 C), temperature source Oral, resp. rate 19, height '5\' 10"'$  (1.778 m), weight 93.4 kg, SpO2 99 %. Incision is clean and dry motor function is intact.  Disposition: Discharge disposition: 01-Home or Self Care       Discharge Instructions     Call MD for:  redness, tenderness, or signs of infection (pain, swelling, redness, odor or green/yellow discharge around incision site)   Complete by: As directed    Call MD for:  severe uncontrolled pain   Complete by: As directed    Call MD for:  temperature >100.4   Complete by: As directed    Diet - low sodium heart healthy   Complete by: As directed    Discharge wound care:   Complete by: As directed    Okay to shower. Do not apply salves or appointments to incision. No heavy lifting with the upper extremities greater than 10 pounds. May resume driving when not requiring pain medication and patient feels comfortable with doing so.   Incentive spirometry RT   Complete by: As directed    Increase activity slowly   Complete by: As directed       Allergies as of 02/08/2022   No Known Allergies      Medication List     TAKE these medications    Aleve 220 MG Caps Generic drug: Naproxen Sodium Take 440 mg by mouth daily as needed  (back pain).   BD Insulin Syringe U/F 31G X 5/16" 0.5 ML Misc Generic drug: Insulin Syringe-Needle U-100 USE AS DIRECTED   BD Pen Needle Nano 2nd Gen 32G X 4 MM Misc Generic drug: Insulin Pen Needle USE AS DIRECTED   carvedilol 12.5 MG tablet Commonly known as: COREG TAKE 1 TABLET BY MOUTH 2 TIMES DAILY.   Depo-Testosterone 200 MG/ML injection Generic drug: testosterone cypionate Inject 200 mg into the muscle every 3 (three) months.   diazepam 5 MG tablet Commonly known as: Valium Take 1 tablet (5 mg total) by mouth every 6 (six) hours as needed for muscle spasms.   Entresto 97-103 MG Generic drug: sacubitril-valsartan Take 1 tablet by mouth 2 (two) times daily.   EPINEPHrine 0.3 mg/0.3 mL Soaj injection Commonly known as: EPI-PEN Use as directed for life threatening allergic reactions only What changed:  how much to take how to take this when to take this reasons to take this   esomeprazole 40 MG capsule Commonly known as: NEXIUM TAKE 1 CAPSULE (40 MG TOTAL) BY MOUTH DAILY AT 12 NOON.   furosemide 40 MG tablet Commonly known as: LASIX TAKE 1 TABLET EVERY DAY AS NEEDED FOR FLUID *SHORTNESS OF BREATH*   ibuprofen 200 MG tablet Commonly known as: ADVIL Take 400 mg by mouth every 6 (six) hours as needed for mild pain or moderate pain.   Jardiance 10 MG Tabs tablet Generic drug: empagliflozin TAKE 1 TABLET BY MOUTH EVERY  DAY BEFORE BREAKFAST   levothyroxine 150 MCG tablet Commonly known as: SYNTHROID TAKE 1 TABLET BY MOUTH EVERY DAY   LORazepam 1 MG tablet Commonly known as: ATIVAN TAKE 1 TABLET BY MOUTH TWICE A DAY AS NEEDED What changed:  how much to take how to take this when to take this reasons to take this additional instructions   montelukast 10 MG tablet Commonly known as: SINGULAIR TAKE 1 TABLET BY MOUTH EVERYDAY AT BEDTIME   oxyCODONE-acetaminophen 5-325 MG tablet Commonly known as: PERCOCET/ROXICET Take 1 tablet by mouth every 4 (four)  hours as needed for moderate pain or severe pain.   pregabalin 75 MG capsule Commonly known as: LYRICA Take 75 mg by mouth 2 (two) times daily.   tirzepatide 15 MG/0.5ML Pen Commonly known as: MOUNJARO Inject 15 mg into the skin once a week.   Tyler Aas FlexTouch 100 UNIT/ML FlexTouch Pen Generic drug: insulin degludec INJECT 10 UNITS INTO THE SKIN DAILY. What changed:  how much to take when to take this additional instructions   Vagifem 10 MCG Tabs vaginal tablet Generic drug: Estradiol Place 1 tablet vaginally 2 (two) times a week.   VITAMIN C PO Take 1 tablet by mouth daily. gummy               Discharge Care Instructions  (From admission, onward)           Start     Ordered   02/08/22 0000  Discharge wound care:       Comments: Okay to shower. Do not apply salves or appointments to incision. No heavy lifting with the upper extremities greater than 10 pounds. May resume driving when not requiring pain medication and patient feels comfortable with doing so.   02/08/22 1727             Signed: Blanchie Dessert Aaleigha Bozza 02/08/2022, 5:28 PM

## 2022-02-08 NOTE — Anesthesia Postprocedure Evaluation (Signed)
Anesthesia Post Note  Patient: Rebecca [name]  Procedure(s) Performed: Left Lumbar Five-Sacral One Microdiscectomy with Metrex (Left: Back)     Patient location during evaluation: PACU Anesthesia Type: General Level of consciousness: awake and alert Pain management: pain level controlled Vital Signs Assessment: post-procedure vital signs reviewed and stable Respiratory status: spontaneous breathing, nonlabored ventilation, respiratory function stable and patient connected to nasal cannula oxygen Cardiovascular status: blood pressure returned to baseline and stable Postop Assessment: no apparent nausea or vomiting Anesthetic complications: no   No notable events documented.  Last Vitals:  Vitals:   02/08/22 1201 02/08/22 1557  BP: 108/76 109/79  Pulse: 74 77  Resp: 18 19  Temp: (!) 36.3 C 36.6 C  SpO2: 99% 99%    Last Pain:  Vitals:   02/08/22 1557  TempSrc: Oral  PainSc:                  March Rummage Khang Hannum

## 2022-02-08 NOTE — Progress Notes (Signed)
Patient ID: Rebecca [name], female   DOB: 11-24-1970, 51 y.o.   MRN: [id number] Vital signs are stable.  Patient has no pain and no numbness in her left lower extremity.  He is quite pleased about this.  She still has some moderate back soreness.  Is not been ambulating to any significant degree.  Her incision is clean and dry.  Like to stay overnight and we will plan to discharge for the morning.

## 2022-02-08 NOTE — Progress Notes (Signed)
Notified Dr. Tobias Alexander that pt had not taken her Coreg this morning and her BP was 100/64 and heart rate 78. Told him that pt said she had not taken the Coreg at Dr. Irven Shelling direction. Dr. Tobias Alexander gave order for patient to take home dose of Coreg this am. Order placed and med administered.

## 2022-02-08 NOTE — Op Note (Signed)
Date of surgery: 02/08/2022 Preoperative diagnosis: Herniated nucleus pulposus L5-S1 left with left lumbar radiculopathy Postoperative diagnosis: Same Procedure: Microdiscectomy L5-S1 left with operating microscope Metrix retractor Surgeon: Kristeen Miss Anesthesia: General endotracheal Indications: 1 clinical Rebecca [name] is a 51 year old individual whose had significant back and left lower extremity pain she had a disc herniation diagnosed over a year ago and seemed to be tolerating this well about 3 months ago the pain became severely more acute and a repeat MRI demonstrated that she had a recurrence with a much larger extruded fragment of disc at L5-S1 she is advised regarding surgery.  Procedure the patient was brought to the operating room supine on the stretcher.  After the smooth induction of general endotracheal anesthesia with placement of appropriate arterial monitoring line for her condition she was turned prone the back was prepped with alcohol DuraPrep and draped in sterile fashion midline incision was created and carried down over the L5-S1 space that was localized with fluoroscopy a K wire was then passed to the laminar arch of L5 on the left side and a winding technique was used to expand the space to allow placement of a series of dilators to 20 mm size a 20 mm diameter cannula with 6 cm of depth was used and secured to the operating table with a clamp.  Then the microscope was brought into view and through this aperture the fascial attachments over the laminar arch of L5 at the L5-S1 interspace was cleared on the left side.  Laminotomy was created removing the inferior margin lamina of L5 out to the the mesial wall the facet and the partial medial facetectomy was performed through this aperture the L ligament was taken up and underlying this the common dural tube and particularly the path of the S1 nerve root was identified being both dorsally and laterally this was carefully dissected and the S1  nerve root could be retracted medially off of a significant glistening mass of the disc that was covered with a thin veil of ligamentous tissue this was then incised and several fragments of disc material were removed from within the space further exploration yielded a direct opening into the disc space and several fragments of disc were loose in the opening and this was further explored and's a number of disc fragments were removed from within the disc space itself the space was explored further to relieve any loose material that was in the disc space and perform a good decompression in the region of the posterior longitudinal ligament.  In the end of the S1 nerve root was lying flat across the disc space was well decompressed.  Hemostasis in the soft tissues was obtained meticulously and then 2 cc of half percent Marcaine was placed in the epidural space and into the disc space.  An additional 15 cc of Marcaine was injected into the paraspinous musculature the retractor was gradually removed under direct visualization a couple of deep stitches with 3-0 Vicryl were placed to secure the deep tissues and then 3-0 Vicryl and 4-0 Vicryl was used to close the subcuticular skin blood loss for the procedure was less than 20 cc.  Patient was returned to recovery room in stable condition.

## 2022-02-08 NOTE — H&P (Signed)
Rebecca [name] is an 51 y.o. female.   Chief Complaint: Buttock and left lower extremity pain, herniated nucleus pulposus L5-S1 left HPI: Patient is a 51 year old right-handed individual who has had buttock and left lower extremity pain for over a years period of time I saw her in the summer 2022 and noted that she had a herniated nucleus pulposus at the L5-S1 level we discussed conservative efforts as she was not keen on surgery and she seemed to tolerate the pain reasonably well she notes that it never completely went away but approximately 3 months ago it became much more severe and intense.  Feels that left leg is weak.  Recent MRI demonstrates that there has been enlargement of the herniated nucleus pulposus at the L5-S1 level.  Having failed efforts at conservative management including extensive physical therapy she has been advised regarding surgical decompression of the L5-S1 space on the left side.  Her past medical history is notable for having had treatment for breast cancer which created some cardiac issues for her.  She has a low cardiac output which is monitored by her cardiologist.  Is cancer free at the current time.  Past Medical History:  Diagnosis Date   Allergy    constant hives post chemo therapy.  All allergen tests have been neg.   Anxiety    Breast cancer ([place])    Coronary artery disease    Diabetes mellitus without complication ([place])    Diverticulitis    GERD (gastroesophageal reflux disease)    Hiatal hernia    Hyperlipidemia    Hypertension    Hypothyroidism    LBBB (left bundle branch block)    Pneumonia     Past Surgical History:  Procedure Laterality Date   BREAST RECONSTRUCTION WITH PLACEMENT OF TISSUE EXPANDER AND ALLODERM Bilateral 11/18/2017   Procedure: BILATERAL BREAST RECONSTRUCTION WITH PLACEMENT OF TISSUE EXPANDER AND ALLODERM;  Surgeon: Irene Limbo, MD;  Location: [facility];  Service: Plastics;  Laterality: Bilateral;    CESAREAN SECTION  2007/2010   x2   COLONOSCOPY  2017   LAPAROSCOPIC TUBAL LIGATION  06/12/2011   Procedure: LAPAROSCOPIC TUBAL LIGATION;  Surgeon: Daria Pastures, MD;  Location: [facility];  Service: Gynecology;  Laterality: N/A;  filshie clip   LIPOSUCTION WITH LIPOFILLING Bilateral 02/24/2018   Procedure: LIPOFILLING FROM ABDOMEN TO BILATERAL CHEST;  Surgeon: Irene Limbo, MD;  Location: [facility];  Service: Plastics;  Laterality: Bilateral;   MASTECTOMY W/ SENTINEL NODE BIOPSY Bilateral 11/18/2017   Procedure: BILATERAL TOTAL MASTECTOMIES WITH RIGHT SENTINEL LYMPH NODE BIOPSY;  Surgeon: Rolm Bookbinder, MD;  Location: [facility];  Service: General;  Laterality: Bilateral;   mini tuck  2012   abdominal   PORT-A-CATH REMOVAL Right 01/05/2019   PORTACATH PLACEMENT Right 07/15/2017   Procedure: INSERTION PORT-A-CATH WITH ULTRASOUND;  Surgeon: Rolm Bookbinder, MD;  Location: [facility];  Service: General;  Laterality: Right;   REMOVAL OF BILATERAL TISSUE EXPANDERS WITH PLACEMENT OF BILATERAL BREAST IMPLANTS Bilateral 02/24/2018   Procedure: REMOVAL OF BILATERAL TISSUE EXPANDERS WITH PLACEMENT OF BILATERAL BREAST IMPLANTS;  Surgeon: Irene Limbo, MD;  Location: [facility];  Service: Plastics;  Laterality: Bilateral;   right breast cancer     upper-outer quadrant    right ear surg  03/2008   Mohs   TUBAL LIGATION     US ECHOCARDIOGRAPHY  12-30-2007   EF 55-60%   WISDOM TOOTH EXTRACTION      Family History  Problem Relation Age of Onset   Lymphoma Father    Cancer Father        lymphoma   Diabetes Brother    Diabetes Mother    Liver disease Mother        NASH, d. 53   Asthma Mother    CAD Maternal Grandmother    CAD Maternal Grandfather    Stomach cancer Neg Hx    Colon cancer Neg Hx    Colon polyps Neg Hx    Esophageal cancer Neg Hx    Rectal cancer Neg Hx    Social History:  reports that she quit smoking  about 16 years ago. Her smoking use included cigarettes. She has a 1.50 pack-year smoking history. She has never used smokeless tobacco. She reports current alcohol use. She reports that she does not use drugs.  Allergies: No Known Allergies  Medications Prior to Admission  Medication Sig Dispense Refill   BD INSULIN SYRINGE U/F 31G X 5/16" 0.5 ML MISC USE AS DIRECTED 300 each 3   BD PEN NEEDLE NANO 2ND GEN 32G X 4 MM MISC USE AS DIRECTED 100 each 3   carvedilol (COREG) 12.5 MG tablet TAKE 1 TABLET BY MOUTH 2 TIMES DAILY. 180 tablet 3   ENTRESTO 97-103 MG Take 1 tablet by mouth 2 (two) times daily.     esomeprazole (NEXIUM) 40 MG capsule TAKE 1 CAPSULE (40 MG TOTAL) BY MOUTH DAILY AT 12 NOON. 90 capsule 3   furosemide (LASIX) 40 MG tablet TAKE 1 TABLET EVERY DAY AS NEEDED FOR FLUID *SHORTNESS OF BREATH* 30 tablet 1   ibuprofen (ADVIL) 200 MG tablet Take 400 mg by mouth every 6 (six) hours as needed for mild pain or moderate pain.     levothyroxine (SYNTHROID) 150 MCG tablet TAKE 1 TABLET BY MOUTH EVERY DAY 90 tablet 2   LORazepam (ATIVAN) 1 MG tablet TAKE 1 TABLET BY MOUTH TWICE A DAY AS NEEDED (Patient taking differently: Take 1 mg by mouth at bedtime as needed for sleep.) 60 tablet 3   montelukast (SINGULAIR) 10 MG tablet TAKE 1 TABLET BY MOUTH EVERYDAY AT BEDTIME 90 tablet 1   Naproxen Sodium (ALEVE) 220 MG CAPS Take 440 mg by mouth daily as needed (back pain).     pregabalin (LYRICA) 75 MG capsule Take 75 mg by mouth 2 (two) times daily.     testosterone cypionate (DEPO-TESTOSTERONE) 200 MG/ML injection Inject 200 mg into the muscle every 3 (three) months.     tirzepatide (MOUNJARO) 15 MG/0.5ML Pen Inject 15 mg into the skin once a week. 2 mL 6   TRESIBA FLEXTOUCH 100 UNIT/ML FlexTouch Pen INJECT 10 UNITS INTO THE SKIN DAILY. (Patient taking differently: Inject 0-8 Units into the skin at bedtime. Sliding scale) 3 mL 1   VAGIFEM 10 MCG TABS vaginal tablet Place 1 tablet vaginally 2 (two)  times a week.     Ascorbic Acid (VITAMIN C PO) Take 1 tablet by mouth daily. gummy     EPINEPHrine 0.3 mg/0.3 mL IJ SOAJ injection Use as directed for life threatening allergic reactions only (Patient taking differently: Inject 0.3 mg into the muscle as needed for anaphylaxis. Use as directed for life threatening allergic reactions only) 2 each 3   JARDIANCE 10 MG TABS tablet TAKE 1 TABLET BY MOUTH EVERY DAY BEFORE BREAKFAST (Patient not taking: Reported on 02/07/2022) 90 tablet 1    Results for orders placed or performed during the hospital encounter of 02/08/22 (from the past  48 hour(s))  Glucose, capillary     Status: None   Collection Time: 02/08/22  6:12 AM  Result Value Ref Range   Glucose-Capillary 96 70 - 99 mg/dL    Comment: Glucose reference range applies only to samples taken after fasting for at least 8 hours.   No results found.  Review of Systems  Constitutional:  Positive for activity change.  Musculoskeletal:  Positive for back pain, gait problem and myalgias.  Neurological:  Positive for weakness and numbness.  All other systems reviewed and are negative.   Blood pressure 100/64, pulse 78, temperature (!) 97.5 F (36.4 C), temperature source Oral, resp. rate 17, height '5\' 10"'$  (1.778 m), weight 93.4 kg, SpO2 99 %. Physical Exam Constitutional:      Appearance: Normal appearance. She is normal weight.  HENT:     Head: Normocephalic and atraumatic.     Right Ear: Tympanic membrane, ear canal and external ear normal.     Left Ear: Tympanic membrane, ear canal and external ear normal.     Nose: Nose normal.     Mouth/Throat:     Mouth: Mucous membranes are moist.     Pharynx: Oropharynx is clear.  Eyes:     Extraocular Movements: Extraocular movements intact.     Conjunctiva/sclera: Conjunctivae normal.     Pupils: Pupils are equal, round, and reactive to light.  Cardiovascular:     Rate and Rhythm: Normal rate and regular rhythm.  Pulmonary:     Effort:  Pulmonary effort is normal.     Breath sounds: Normal breath sounds.  Abdominal:     General: Abdomen is flat.     Palpations: Abdomen is soft.  Musculoskeletal:        General: Normal range of motion.     Cervical back: Normal range of motion and neck supple.     Comments: Positive straight leg raising on the left side at 30 degrees.  Skin:    General: Skin is warm and dry.     Capillary Refill: Capillary refill takes less than 2 seconds.  Neurological:     Mental Status: She is alert.     Comments: Alert and oriented motor strength is intact in the upper extremities and lower extremity she has some weakness in the gastroc on the left side at 4 out of 5.  Absent Achilles reflex.  Straight leg raising is markedly positive on the left side.  Proximal strength in the tibialis anterior iliopsoas quadricep is all intact.      Assessment/Plan Herniated nucleus pulposus L5-S1 left with left lumbar radiculopathy.  Plan microdiscectomy L5-S1 left  Earleen Newport, MD 02/08/2022, 7:45 AM

## 2022-02-08 NOTE — Transfer of Care (Signed)
Immediate Anesthesia Transfer of Care Note  Patient: Rebecca [name]  Procedure(s) Performed: Left Lumbar Five-Sacral One Microdiscectomy with Metrex (Left: Back)  Patient Location: PACU  Anesthesia Type:General  Level of Consciousness: awake, drowsy, patient cooperative and responds to stimulation  Airway & Oxygen Therapy: Patient Spontanous Breathing and Patient connected to face mask oxygen  Post-op Assessment: Report given to RN, Post -op Vital signs reviewed and stable and Patient moving all extremities X 4  Post vital signs: Reviewed and stable  Last Vitals:  Vitals Value Taken Time  BP 100/73 02/08/22 1023  Temp    Pulse 79 02/08/22 1025  Resp 22 02/08/22 1025  SpO2 95 % 02/08/22 1025  Vitals shown include unvalidated device data.  Last Pain:  Vitals:   02/08/22 0642  TempSrc:   PainSc: 6          Complications: No notable events documented.

## 2022-02-08 NOTE — Anesthesia Procedure Notes (Signed)
Procedure Name: Intubation Date/Time: 02/08/2022 8:33 AM  Performed by: Cathren Harsh, CRNAPre-anesthesia Checklist: Patient identified, Emergency Drugs available, Suction available and Patient being monitored Patient Re-evaluated:Patient Re-evaluated prior to induction Oxygen Delivery Method: Circle System Utilized Preoxygenation: Pre-oxygenation with 100% oxygen Induction Type: IV induction Ventilation: Mask ventilation without difficulty Laryngoscope Size: Mac and 3 Grade View: Grade I Tube type: Oral Tube size: 7.0 mm Number of attempts: 1 Airway Equipment and Method: Stylet and Oral airway Placement Confirmation: ETT inserted through vocal cords under direct vision, positive ETCO2 and breath sounds checked- equal and bilateral Secured at: 22 cm Tube secured with: Tape Dental Injury: Teeth and Oropharynx as per pre-operative assessment

## 2022-02-08 NOTE — Anesthesia Procedure Notes (Signed)
Arterial Line Insertion Start/End10/13/2023 7:00 AM, 02/08/2022 7:12 AM Performed by: Roslynn Amble, CRNA, CRNA  Patient location: Pre-op. Preanesthetic checklist: patient identified, IV checked, site marked, risks and benefits discussed, surgical consent, monitors and equipment checked, pre-op evaluation, timeout performed and anesthesia consent Lidocaine 1% used for infiltration radial was placed Catheter size: 20 G Hand hygiene performed , maximum sterile barriers used  and Seldinger technique used Allen's test indicative of satisfactory collateral circulation Attempts: 1 Procedure performed using ultrasound guided technique. Ultrasound Notes:anatomy identified, needle tip was noted to be adjacent to the nerve/plexus identified and no ultrasound evidence of intravascular and/or intraneural injection Following insertion, dressing applied. Post procedure assessment: normal and unchanged  Patient tolerated the procedure well with no immediate complications.

## 2022-02-09 ENCOUNTER — Encounter (HOSPITAL_COMMUNITY): Payer: Self-pay | Admitting: Neurological Surgery

## 2022-02-09 DIAGNOSIS — M5127 Other intervertebral disc displacement, lumbosacral region: Secondary | ICD-10-CM | POA: Diagnosis not present

## 2022-02-09 LAB — GLUCOSE, CAPILLARY: Glucose-Capillary: 90 mg/dL (ref 70–99)

## 2022-02-09 NOTE — Progress Notes (Signed)
Patient discharged home as ordered. Patient stated understanding of discharge instructions given

## 2022-02-09 NOTE — Evaluation (Signed)
Physical Therapy Evaluation Patient Details Name: Rebecca [name] MRN: [id number] DOB: 06/10/1970 Today's Date: 02/09/2022  History of Present Illness  51 yo F s/p surgical decompression for HNP at L5-S1.  PMH significant for but not limited to : Breast cancer, LBBB (left bundle branch block), CHF.  Clinical Impression  Patient is s/p above surgery resulting in the deficits listed below (see PT Problem List).  Patient mobilizing well and anticipating DC ASAP.  I have answered all patient's question regarding PT and mobility.    I have encouraged the patient to gradually increase activity daily to tolerance.   Pt feels ready for DC home today.         Recommendations for follow up therapy are one component of a multi-disciplinary discharge planning process, led by the attending physician.  Recommendations may be updated based on patient status, additional functional criteria and insurance authorization.  Follow Up Recommendations No PT follow up      Assistance Recommended at Discharge PRN  Patient can return home with the following       Equipment Recommendations None recommended by PT  Recommendations for Other Services       Functional Status Assessment Patient has had a recent decline in their functional status and/or demonstrates limited ability to make significant improvements in function in a reasonable and predictable amount of time     Precautions / Restrictions Precautions Precautions: Back Precaution Booklet Issued: Yes (comment) Precaution Comments: Instructed pt and answered questions realted to back precautions Restrictions Weight Bearing Restrictions: No      Mobility  Bed Mobility Overal bed mobility: Modified Independent (Reviewed log rolling)                  Transfers Overall transfer level: Modified independent Equipment used: None               General transfer comment: No assist needed. Pt reports she stands slowly due to low BP     Ambulation/Gait Ambulation/Gait assistance: Independent Gait Distance (Feet): 200 Feet Assistive device: None Gait Pattern/deviations: Step-through pattern       General Gait Details: no balance losses or difficulty with ambulation.  Stairs Stairs:  (Verbally discussed stair navigation and use of rail)          Wheelchair Mobility    Modified Rankin (Stroke Patients Only)       Balance Overall balance assessment: No apparent balance deficits (not formally assessed) (Fall at home related to BP, not balance)                                           Pertinent Vitals/Pain Pain Assessment Pain Assessment: 0-10 Pain Score: 4  Pain Location: low back and buttocks Pain Intervention(s): Monitored during session, Premedicated before session    Home Living Family/patient expects to be discharged to:: Private residence Living Arrangements: Spouse/significant other;Children Available Help at Discharge: Family Type of Home: House Home Access: Level entry       Home Layout: One level Home Equipment: None      Prior Function Prior Level of Function : Independent/Modified Independent;History of Falls (last six months) (Had a fall related to low BP)                     Hand Dominance        Extremity/Trunk Assessment   Upper Extremity Assessment  Upper Extremity Assessment: Overall WFL for tasks assessed    Lower Extremity Assessment Lower Extremity Assessment: Overall WFL for tasks assessed    Cervical / Trunk Assessment Cervical / Trunk Assessment: Back Surgery  Communication   Communication: No difficulties  Cognition Arousal/Alertness: Awake/alert Behavior During Therapy: WFL for tasks assessed/performed Overall Cognitive Status: Within Functional Limits for tasks assessed                                          General Comments      Exercises     Assessment/Plan    PT Assessment Patient does not  need any further PT services  PT Problem List         PT Treatment Interventions      PT Goals (Current goals can be found in the Care Plan section)       Frequency       Co-evaluation               AM-PAC PT "6 Clicks" Mobility  Outcome Measure Help needed turning from your back to your side while in a flat bed without using bedrails?: None Help needed moving from lying on your back to sitting on the side of a flat bed without using bedrails?: None Help needed moving to and from a bed to a chair (including a wheelchair)?: None Help needed standing up from a chair using your arms (e.g., wheelchair or bedside chair)?: None Help needed to walk in hospital room?: None Help needed climbing 3-5 steps with a railing? : A Little 6 Click Score: 23    End of Session Equipment Utilized During Treatment: Gait belt Activity Tolerance: Patient tolerated treatment well Patient left: in bed;with call bell/phone within reach Nurse Communication: Mobility status PT Visit Diagnosis: Unsteadiness on feet (R26.81)    Time: 5686-1683 PT Time Calculation (min) (ACUTE ONLY): 15 min   Charges:   PT Evaluation $PT Eval Low Complexity: [place]  Office [phone] 02/09/2022   Melvern Banker 02/09/2022, 8:25 AM

## 2022-02-09 NOTE — Plan of Care (Signed)

## 2022-02-09 NOTE — Progress Notes (Signed)
OT Cancellation Note  Patient Details Name: Rebecca [name] MRN: [id number] DOB: [birth year]   Cancelled Treatment:    Reason Eval/Treat Not Completed: OT screened, no needs identified, will sign off. PT saw this pt who had already performed routine ADLs on his arrival. PT provided education regarding precautions, and states appropriate to screen. Based on PT and nursing report of current independence level, no acute OT needs identified. OT to sign off.   Shanda Howells, OTR/L [facility] Acute Rehabilitation Office: [phone].    [name] 02/09/2022, 8:53 AM

## 2022-02-12 ENCOUNTER — Other Ambulatory Visit: Payer: Self-pay | Admitting: Cardiology

## 2022-02-12 DIAGNOSIS — I427 Cardiomyopathy due to drug and external agent: Secondary | ICD-10-CM

## 2022-02-12 DIAGNOSIS — I5022 Chronic systolic (congestive) heart failure: Secondary | ICD-10-CM

## 2022-02-18 ENCOUNTER — Encounter: Payer: Self-pay | Admitting: Internal Medicine

## 2022-02-18 ENCOUNTER — Ambulatory Visit: Payer: No Typology Code available for payment source | Admitting: Internal Medicine

## 2022-02-18 VITALS — BP 102/78 | HR 92 | Temp 98.6°F | Ht 70.0 in | Wt 205.4 lb

## 2022-02-18 DIAGNOSIS — Z853 Personal history of malignant neoplasm of breast: Secondary | ICD-10-CM

## 2022-02-18 DIAGNOSIS — E039 Hypothyroidism, unspecified: Secondary | ICD-10-CM

## 2022-02-18 DIAGNOSIS — Z09 Encounter for follow-up examination after completed treatment for conditions other than malignant neoplasm: Secondary | ICD-10-CM

## 2022-02-18 DIAGNOSIS — I427 Cardiomyopathy due to drug and external agent: Secondary | ICD-10-CM | POA: Diagnosis not present

## 2022-02-18 DIAGNOSIS — Z7185 Encounter for immunization safety counseling: Secondary | ICD-10-CM | POA: Diagnosis not present

## 2022-02-18 DIAGNOSIS — Z8616 Personal history of COVID-19: Secondary | ICD-10-CM

## 2022-02-18 DIAGNOSIS — Z23 Encounter for immunization: Secondary | ICD-10-CM

## 2022-02-18 DIAGNOSIS — E669 Obesity, unspecified: Secondary | ICD-10-CM

## 2022-02-18 DIAGNOSIS — Z872 Personal history of diseases of the skin and subcutaneous tissue: Secondary | ICD-10-CM

## 2022-02-18 DIAGNOSIS — E1169 Type 2 diabetes mellitus with other specified complication: Secondary | ICD-10-CM

## 2022-02-18 DIAGNOSIS — Z9889 Other specified postprocedural states: Secondary | ICD-10-CM

## 2022-02-18 DIAGNOSIS — Z6829 Body mass index (BMI) 29.0-29.9, adult: Secondary | ICD-10-CM

## 2022-02-18 NOTE — Patient Instructions (Addendum)
Flu vaccine given. Labs are stable. CPE is due in 6 months.

## 2022-02-20 ENCOUNTER — Encounter: Payer: Self-pay | Admitting: Cardiology

## 2022-02-20 ENCOUNTER — Other Ambulatory Visit: Payer: Self-pay | Admitting: Hematology and Oncology

## 2022-02-20 DIAGNOSIS — Z171 Estrogen receptor negative status [ER-]: Secondary | ICD-10-CM

## 2022-02-20 NOTE — Progress Notes (Signed)
Patient is experiencing back pain as well as abdominal pain. Given her history of high risk of breast cancer we will obtain CT chest abdomen pelvis with contrast. We would like to do this on 03/04/2022 Follow-up with me on 03/06/2022. She will do labs on the day of her scan.

## 2022-02-21 ENCOUNTER — Other Ambulatory Visit: Payer: Self-pay

## 2022-02-21 MED ORDER — ENTRESTO 97-103 MG PO TABS: 1.0000 | ORAL_TABLET | Freq: Two times a day (BID) | ORAL | 1 refills | Status: DC

## 2022-02-26 NOTE — Progress Notes (Signed)
Subjective:    Patient ID: Rebecca [name], female    DOB: [birth year], 51 y.o.   MRN: [id number]  HPI 51 year old Female with history of type 2 diabetes mellitus, history of breast cancer, hypothyroidism, chronic systolic heart failure followed by Dr. Nadyne Coombes, history of urticaria seen by Dr. Neldon Mc.  History of chemotherapy-induced cardiomyopathy.  She is on Entresto.  She has a history of GE reflux treated with Nexium.  She is currently on Jardiance per Dr. Nadyne Coombes for diabetes mellitus.  History of anxiety and takes lorazepam twice daily as needed.    She recently learned that her job with [employer] will be terminated sometime in the future.  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  This was a bit of a shock to her.  We discussed this at length today.  We discussed future plans as well.  Echo cardiogram done by Dr. Nadyne Coombes in September showed an ejection fraction of 25 to 30%.  She gets short of breath at times.  She has decreased exercise tolerance due to cardiomyopathy from chemotherapy.  She recently had lumbar spine surgery by Dr. Ellene Route.  In September she had a head injury after a fall.  She apparently had hypotension dizziness and syncope.  She struck a desk in her home with her head.  She went to [facility] urgent care and had laceration 3.5 to 4 cm noted.  Was sent to [facility] for evaluation.  Had CT of the head without contrast showing no acute intracranial abnormality.  She also had CT of the cervical spine without contrast showing no spine pathology.  Had scalp laceration repaired at the urgent care.  Subsequently was having back pain and was found to have herniated disc L5-S1 and underwent surgery at [facility] in October 13 and was hospitalized overnight.  Was discharged home on October 14.  He lipid A1c October 9 was excellent at 5.8% and TSH was normal.  CBC was stable and TSH was normal.  Total cholesterol 222 and triglycerides 170 with HDL of 49  and LDL of 142.  Is followed by Dr. Lindi Adie for history of breast cancer.  Agrees to high-dose flu vaccine.  Does not want other vaccines at this time including COVID booster.  Review of Systems see above-regarding symptoms     Objective:   Physical Exam blood pressure 102/78, pulse 92, pulse oximetry 99% on room air weight 205 pounds 6.4 ounces BMI 29.47 Skin: Warm and dry.  Nodes none.  No thyromegaly.  No carotid bruits.  Chest clear.  Cardiac exam: Regular rate and rhythm.  Trace lower extremity edema.  Affect thought and judgment appear to be normal.       Assessment & Plan:  Head injury secondary to fall requiring sutures and close observation  Lumbar radiculopathy underwent recent surgery for herniated disc L5-S1 and has done well  History of breast cancer follow by Dr. Lindi Adie.  History of bilateral mastectomy and chemotherapy.  Has a chemotherapy-induced cardiomyopathy  Diabetes mellitus treated with Dr. Nadyne Coombes  Cardiomyopathy secondary to chemotherapy followed by Dr. Nadyne Coombes  Essential hypertension stable  Hyperlipidemia stable  History of COVID-19 requiring hospitalization in August 2021.  Had acute hypoxemic respiratory failure requiring hospitalization.  Patient treated supportively with high-dose steroids, remdesivir and baricitnib.  Was discharged home on 6 L of oxygen and has made good recovery  Hypothyroidism stable on thyroid replacement  Plan: Continue current medications.  Flu vaccine given.  Follow-up  in 6 months.

## 2022-02-27 ENCOUNTER — Encounter: Payer: Self-pay | Admitting: Cardiology

## 2022-02-27 ENCOUNTER — Ambulatory Visit: Payer: No Typology Code available for payment source | Admitting: Cardiology

## 2022-02-27 VITALS — BP 99/63 | HR 74 | Resp 16 | Ht 70.0 in | Wt 202.8 lb

## 2022-02-27 DIAGNOSIS — I427 Cardiomyopathy due to drug and external agent: Secondary | ICD-10-CM

## 2022-02-27 DIAGNOSIS — I10 Essential (primary) hypertension: Secondary | ICD-10-CM

## 2022-02-27 DIAGNOSIS — I5022 Chronic systolic (congestive) heart failure: Secondary | ICD-10-CM

## 2022-02-27 DIAGNOSIS — E782 Mixed hyperlipidemia: Secondary | ICD-10-CM

## 2022-02-27 MED ORDER — ROSUVASTATIN CALCIUM 20 MG PO TABS: 20.0000 mg | ORAL_TABLET | Freq: Every day | ORAL | 3 refills | Status: DC

## 2022-02-27 NOTE — Progress Notes (Signed)
Patient Care Team: Elby Showers, MD as PCP - General (Internal Medicine) Baxley, Cresenciano Lick, MD (Internal Medicine)  DIAGNOSIS: No diagnosis found.  SUMMARY OF ONCOLOGIC HISTORY: Oncology History  Malignant neoplasm of upper-outer quadrant of right breast in female, estrogen receptor negative ([place])  07/09/2017 Initial Diagnosis   Patient palpated a right breast mass which was evaluated by mammogram and ultrasound and a breast MRI which revealed 4.5 x 4 cm necrotic tumor with suspicious multiple right axillary lymph nodes; biopsy revealed IDC grade 3 ER 0%, PR 0%, HER-2 positive ratio 3.08, Ki-67 70%, T2NX stage IIa/IIb   07/18/2017 - 11/04/2017 Neo-Adjuvant Chemotherapy   TCH Perjeta x6 cycles followed by Herceptin Perjeta maintenance   08/15/2017 Genetic Testing   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  The Multi-Gene Panel offered by [facility] includes sequencing and/or deletion duplication testing of the following 83 genes: ALK, APC, ATM, AXIN2,BAP1,  BARD1, BLM, BMPR1A, BRCA1, BRCA2, BRIP1, CASR, CDC73, CDH1, CDK4, CDKN1B, CDKN1C, CDKN2A (p14ARF), CDKN2A (p16INK4a), CEBPA, CHEK2, CTNNA1, DICER1, DIS3L2, EGFR (c.2369C>T, p.Thr790Met variant only), EPCAM (Deletion/duplication testing only), FH, FLCN, GATA2, GPC3, GREM1 (Promoter region deletion/duplication testing only), HOXB13 (c.251G>A, p.Gly84Glu), HRAS, KIT, MAX, MEN1, MET, MITF (c.952G>A, p.Glu318Lys variant only), MLH1, MSH2, MSH3, MSH6, MUTYH, NBN, NF1, NF2, NTHL1, PALB2, PDGFRA, PHOX2B, PMS2, POLD1, POLE, POT1, PRKAR1A, PTCH1, PTEN, RAD50, RAD51C, RAD51D, RB1, RECQL4, RET, RUNX1, SDHAF2, SDHA (sequence changes only), SDHB, SDHC, SDHD, SMAD4, SMARCA4, SMARCB1, SMARCE1, STK11, SUFU, TERT, TERT, TMEM127, TP53, TSC1, TSC2, VHL, WRN and WT1.  The report date is August 15, 2017.   11/18/2017 Surgery   Bilateral mastectomies: Right mastectomy: IDC grade 3 3.8 cm margins negative, 0/4 lymph nodes negative, ER 0%, PR 0%,  HER-2 negative ratio 1.3, IHC HER-2 negative; Ki-67 70%, RCB class II; left mastectomy: [facility]   12/18/2017 - 01/27/2018 Chemotherapy   Adjuvant chemotherapy with dose dense Adriamycin and Cytoxan x4   02/17/2018 -  Chemotherapy   Maintenance Herceptin and Perjeta   02/24/2018 Surgery   REMOVAL OF BILATERAL TISSUE EXPANDERS WITH PLACEMENT OF BILATERAL BREAST IMPLANTS and LIPOFILLING FROM ABDOMEN TO BILATERAL CHEST by Dr. Iran Planas      CHIEF COMPLIANT: Follow-up of right breast cancer    INTERVAL HISTORY: Rebecca [name] is a 51 y.o. with above-mentioned history of right breast cancer having undergone bilateral lumpectomies. CT CAP on 06/07/2020 showed no evidence of metastatic disease within the chest, abdomen, or pelvis. She presents to the clinic today for follow-up.    ALLERGIES:  has No Known Allergies.  MEDICATIONS:  Current Outpatient Medications  Medication Sig Dispense Refill   Ascorbic Acid (VITAMIN C PO) Take 1 tablet by mouth daily. gummy     BD INSULIN SYRINGE U/F 31G X 5/16" 0.5 ML MISC USE AS DIRECTED 300 each 3   BD PEN NEEDLE NANO 2ND GEN 32G X 4 MM MISC USE AS DIRECTED 100 each 3   carvedilol (COREG) 12.5 MG tablet TAKE 1 TABLET BY MOUTH TWICE A DAY 180 tablet 3   diazepam (VALIUM) 5 MG tablet Take 1 tablet (5 mg total) by mouth every 6 (six) hours as needed for muscle spasms. 30 tablet 0   ENTRESTO 97-103 MG Take 1 tablet by mouth 2 (two) times daily. 180 tablet 1   EPINEPHrine 0.3 mg/0.3 mL IJ SOAJ injection Use as directed for life threatening allergic reactions only (Patient taking differently: Inject 0.3 mg into the muscle as needed for anaphylaxis. Use as directed for life threatening allergic  reactions only) 2 each 3   esomeprazole (NEXIUM) 40 MG capsule TAKE 1 CAPSULE (40 MG TOTAL) BY MOUTH DAILY AT 12 NOON. 90 capsule 3   furosemide (LASIX) 40 MG tablet TAKE 1 TABLET EVERY DAY AS NEEDED FOR FLUID *SHORTNESS OF BREATH* 30 tablet 1   ibuprofen (ADVIL) 200 MG  tablet Take 400 mg by mouth every 6 (six) hours as needed for mild pain or moderate pain.     JARDIANCE 10 MG TABS tablet TAKE 1 TABLET BY MOUTH EVERY DAY BEFORE BREAKFAST 90 tablet 1   levothyroxine (SYNTHROID) 150 MCG tablet TAKE 1 TABLET BY MOUTH EVERY DAY 90 tablet 2   LORazepam (ATIVAN) 1 MG tablet TAKE 1 TABLET BY MOUTH TWICE A DAY AS NEEDED (Patient taking differently: Take 1 mg by mouth at bedtime as needed for sleep.) 60 tablet 3   montelukast (SINGULAIR) 10 MG tablet TAKE 1 TABLET BY MOUTH EVERYDAY AT BEDTIME 90 tablet 1   Naproxen Sodium (ALEVE) 220 MG CAPS Take 440 mg by mouth daily as needed (back pain).     oxyCODONE-acetaminophen (PERCOCET/ROXICET) 5-325 MG tablet Take 1 tablet by mouth every 4 (four) hours as needed for moderate pain or severe pain. 30 tablet 0   pregabalin (LYRICA) 75 MG capsule Take 75 mg by mouth 2 (two) times daily.     testosterone cypionate (DEPO-TESTOSTERONE) 200 MG/ML injection Inject 200 mg into the muscle every 3 (three) months.     tirzepatide (MOUNJARO) 15 MG/0.5ML Pen Inject 15 mg into the skin once a week. 2 mL 6   TRESIBA FLEXTOUCH 100 UNIT/ML FlexTouch Pen INJECT 10 UNITS INTO THE SKIN DAILY. (Patient taking differently: Inject 0-8 Units into the skin at bedtime. Sliding scale) 3 mL 1   VAGIFEM 10 MCG TABS vaginal tablet Place 1 tablet vaginally 2 (two) times a week.     No current facility-administered medications for this visit.    PHYSICAL EXAMINATION: ECOG PERFORMANCE STATUS: {CHL ONC ECOG PS:[phone]}  There were no vitals filed for this visit. There were no vitals filed for this visit.  BREAST:*** No palpable masses or nodules in either right or left breasts. No palpable axillary supraclavicular or infraclavicular adenopathy no breast tenderness or nipple discharge. (exam performed in the presence of a chaperone)  LABORATORY DATA:  I have reviewed the data as listed    Latest Ref Rng & Units 02/04/2022   12:00 AM 09/04/2021    2:57 PM  01/08/2021    2:36 PM  CMP  Glucose 65 - 99 mg/dL  91  97   BUN 4 - _0 Creatinine 0.5 - 1.1 1.1     0.96  1.12   Sodium 137 - 147 142     141  141   Potassium 3.5 - 5.1 mEq/L 4.5     5.0  3.9   Chloride 99 - 108 101     104  98   CO2 13 - _1 32   Calcium 8.7 - 10.7 9.7     9.4  9.8   Total Protein 6.1 - 8.1 g/dL  6.8  7.4   Total Bilirubin 0.2 - 1.2 mg/dL  0.3  0.3   Alkaline Phos 25 - 125 59      75   AST 13 - 35 _2 ALT 7 - 35 U/L 18  14  21      This result is from an external source.    Lab Results  Component Value Date   WBC 6.1 02/04/2022   HGB 13.9 02/04/2022   HCT 44 02/04/2022   MCV 81 08/23/2021   PLT 220 02/04/2022   NEUTROABS 64.00 02/04/2022    ASSESSMENT & PLAN:  No problem-specific Assessment & Plan notes found for this encounter.    No orders of the defined types were placed in this encounter.  The patient has a good understanding of the overall plan. she agrees with it. she will call with any problems that may develop before the next visit here. Total time spent: 30 mins including face to face time and time spent for planning, charting and co-ordination of care   Suzzette Righter, [place] 02/27/22    I Gardiner Coins am scribing for Dr. Lindi Adie  ***

## 2022-02-27 NOTE — Progress Notes (Signed)
Primary Physician/Referring:  Elby Showers, MD  Patient ID: Rebecca [name], female    DOB: 26-Aug-1970, 51 y.o.   MRN: [id number]  Chief Complaint  Patient presents with   Cardiomyopathy   LBBB   HPI:    Rebecca [name]  is a 51 y.o. Caucasian female initially with history of LBBB at least dating back to 2013.  At that time she has had a negative nuclear stress test and echocardiogram revealed low normal LVEF at 45-50%.   Past medical history includes hyperthyroidism S/P RAI therapy in March 2018, depression, estrogen negative right breast cancer for which he underwent chemotherapy(Adjuvant chemotherapy with dose dense Adriamycin and Cytoxan x4)  and also bilateral mastectomy with reconstruction surgery on 11/18/2017. Surveillance echocardiogram due to chemotherapy attributed gradual decrease of LVEF to moderate to severe LV systolic dysfunction of 30 to 35%. 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  She is on guideline directed medical therapy.  Patient presents for a 4-week follow-up visit, overall since dietary changes and also starting [facility], she has lost 50 pounds in weight. Patient underwent lumbar scratch that lumbosacral microdiscectomy on 02/08/2022 without periprocedural complications.  Past Medical History:  Diagnosis Date   Allergy    constant hives post chemo therapy.  All allergen tests have been neg.   Anxiety    Breast cancer ([place])    Coronary artery disease    Diabetes mellitus without complication (HCC)    Diverticulitis    GERD (gastroesophageal reflux disease)    Hiatal hernia    Hyperlipidemia    Hypertension    Hypothyroidism    LBBB (left bundle branch block)    Pneumonia     Social History   Tobacco Use   Smoking status: Former    Packs/day: 0.25    Years: 6.00    Total pack years: 1.50    Types: Cigarettes    Quit date: 03/29/2005    Years since quitting: 16.9   Smokeless tobacco: Never  Substance Use Topics    Alcohol use: Yes    Comment: socially  Marital Status: Married    ROS  Review of Systems  Constitutional: Positive for weight loss (intentional).  Cardiovascular:  Negative for chest pain, dyspnea on exertion and leg swelling.   Objective      02/27/2022    2:21 PM 02/27/2022    2:04 PM 02/18/2022   10:31 AM  Vitals with BMI  Height  '5\' 10"'$  '5\' 10"'$   Weight  202 lbs 13 oz 205 lbs 6 oz  BMI  86.7 67.20  Systolic 99 89 947  Diastolic 63 66 78  Pulse 74 79 92    Blood pressure 99/63, pulse 74, resp. rate 16, height '5\' 10"'$  (1.778 m), weight 202 lb 12.8 oz (92 kg), SpO2 98 %. Body mass index is 29.1 kg/m.   Orthostatic VS for the past 72 hrs (Last 3 readings):  Patient Position BP Location Cuff Size  02/27/22 1421 Sitting Left Arm Large  02/27/22 1404 Sitting Left Arm Large    Physical Exam Neck:     Vascular: No JVD.  Cardiovascular:     Rate and Rhythm: Normal rate and regular rhythm.     Pulses: Intact distal pulses.     Heart sounds: Normal heart sounds. No murmur heard.    No gallop.  Pulmonary:     Effort: Pulmonary effort is normal.     Breath sounds: Normal breath sounds.  Abdominal:  General: Bowel sounds are normal.     Palpations: Abdomen is soft.  Musculoskeletal:     Right lower leg: No edema.     Left lower leg: No edema.    Radiology: Laboratory examination:   Recent Labs    09/04/21 1457 02/04/22 0000  NA 141 142  K 5.0 4.5  CL 104 101  CO2 23 22  GLUCOSE 91  --   BUN 23 17  CREATININE 0.96 1.1  CALCIUM 9.4 9.7      Latest Ref Rng & Units 02/04/2022   12:00 AM 09/04/2021    2:57 PM 01/08/2021    2:36 PM  CMP  Glucose 65 - 99 mg/dL  91  97   BUN 4 - '21 17     23  19   '$ Creatinine 0.5 - 1.1 1.1     0.96  1.12   Sodium 137 - 147 142     141  141   Potassium 3.5 - 5.1 mEq/L 4.5     5.0  3.9   Chloride 99 - 108 101     104  98   CO2 13 - '22 22     23  '$ 32   Calcium 8.7 - 10.7 9.7     9.4  9.8   Total Protein 6.1 - 8.1 g/dL  6.8  7.4    Total Bilirubin 0.2 - 1.2 mg/dL  0.3  0.3   Alkaline Phos 25 - 125 59      75   AST 13 - 35 '17     13  20   '$ ALT 7 - 35 U/L '18     14  21      '$ This result is from an external source.      Latest Ref Rng & Units 02/04/2022   12:00 AM 08/23/2021    8:05 AM 01/08/2021    2:36 PM  CBC  WBC  6.1     7.4  11.2   Hemoglobin 12.0 - 16.0 13.9     13.8  14.0   Hematocrit 36 - 46 44     42.2  44.8   Platelets 150 - 400 K/uL 220     264  310      This result is from an external source.   Lipid Panel Recent Labs    08/23/21 0805 02/04/22 0000  CHOL 173 222*  TRIG 181* 170*  LDLCALC 93 142  HDL 49 49     HEMOGLOBIN A1C Lab Results  Component Value Date   HGBA1C 5.8 02/04/2022   MPG 126 12/18/2020   TSH Recent Labs    08/23/21 0805 09/04/21 1457 02/04/22 0000  TSH 4.850* 3.63 0.85   Medications   Current Outpatient Medications:    Ascorbic Acid (VITAMIN C PO), Take 1 tablet by mouth daily. gummy, Disp: , Rfl:    BD INSULIN SYRINGE U/F 31G X 5/16" 0.5 ML MISC, USE AS DIRECTED, Disp: 300 each, Rfl: 3   BD PEN NEEDLE NANO 2ND GEN 32G X 4 MM MISC, USE AS DIRECTED, Disp: 100 each, Rfl: 3   carvedilol (COREG) 12.5 MG tablet, TAKE 1 TABLET BY MOUTH TWICE A DAY, Disp: 180 tablet, Rfl: 3   diazepam (VALIUM) 5 MG tablet, Take 1 tablet (5 mg total) by mouth every 6 (six) hours as needed for muscle spasms., Disp: 30 tablet, Rfl: 0   ENTRESTO 97-103 MG, Take 1 tablet by mouth 2 (two) times daily., Disp: 180  tablet, Rfl: 1   EPINEPHrine 0.3 mg/0.3 mL IJ SOAJ injection, Use as directed for life threatening allergic reactions only (Patient taking differently: Inject 0.3 mg into the muscle as needed for anaphylaxis. Use as directed for life threatening allergic reactions only), Disp: 2 each, Rfl: 3   esomeprazole (NEXIUM) 40 MG capsule, TAKE 1 CAPSULE (40 MG TOTAL) BY MOUTH DAILY AT 12 NOON., Disp: 90 capsule, Rfl: 3   furosemide (LASIX) 40 MG tablet, TAKE 1 TABLET EVERY DAY AS NEEDED FOR FLUID  *SHORTNESS OF BREATH*, Disp: 30 tablet, Rfl: 1   ibuprofen (ADVIL) 200 MG tablet, Take 400 mg by mouth every 6 (six) hours as needed for mild pain or moderate pain., Disp: , Rfl:    levothyroxine (SYNTHROID) 150 MCG tablet, TAKE 1 TABLET BY MOUTH EVERY DAY, Disp: 90 tablet, Rfl: 2   LORazepam (ATIVAN) 1 MG tablet, TAKE 1 TABLET BY MOUTH TWICE A DAY AS NEEDED (Patient taking differently: Take 1 mg by mouth at bedtime as needed for sleep.), Disp: 60 tablet, Rfl: 3   montelukast (SINGULAIR) 10 MG tablet, TAKE 1 TABLET BY MOUTH EVERYDAY AT BEDTIME, Disp: 90 tablet, Rfl: 1   testosterone cypionate (DEPO-TESTOSTERONE) 200 MG/ML injection, Inject 200 mg into the muscle every 3 (three) months., Disp: , Rfl:    tirzepatide (MOUNJARO) 15 MG/0.5ML Pen, Inject 15 mg into the skin once a week., Disp: 2 mL, Rfl: 6   VAGIFEM 10 MCG TABS vaginal tablet, Place 1 tablet vaginally 2 (two) times a week., Disp: , Rfl:    oxyCODONE-acetaminophen (PERCOCET/ROXICET) 5-325 MG tablet, Take 1 tablet by mouth every 4 (four) hours as needed for moderate pain or severe pain. (Patient not taking: Reported on 02/27/2022), Disp: 30 tablet, Rfl: 0   pregabalin (LYRICA) 75 MG capsule, Take 75 mg by mouth 2 (two) times daily. (Patient not taking: Reported on 02/27/2022), Disp: , Rfl:    rosuvastatin (CRESTOR) 20 MG tablet, Take 1 tablet (20 mg total) by mouth daily., Disp: 90 tablet, Rfl: 3   TRESIBA FLEXTOUCH 100 UNIT/ML FlexTouch Pen, INJECT 10 UNITS INTO THE SKIN DAILY. (Patient not taking: Reported on 02/27/2022), Disp: 3 mL, Rfl: 1    Cardiac Studies:   Coronary CTA 01/01/2019: 1. Coronary artery calcium score 50 Agatston units. This places the patient in the 97th percentile for age and gender, suggesting high risk for future cardiac events. 2.  Nonobstructive coronary disease. 3. The RCA originates from the left cusp in close proximity to the left coronary and courses interarterially between the aortic root and proximal PA to  reach typical RCA territory. Would consider treadmill stress testing to assess for provocative ischemia. 4. LAD system: Mixed plaque in the proximal LAD with mild (<50%) stenosis. Circumflex system: Large PLOM, small AV LCx after take-off of PLOM. No plaque or stenosis.  Echocardiogram 01/02/2022: Severely depressed LV systolic function with visual EF 25-30%. Left ventricle cavity is minimally dilated. Dilated cardiomyopathy. Hypokinetic global wall motion. Abnormal septal wall motion due to left bundle branch block. Doppler evidence of grade I (impaired) diastolic dysfunction, normal LAP. Left atrial cavity is mildly dilated. Structurally normal tricuspid valve with trace regurgitation. Mild pulmonary hypertension. RVSP measures 37 mmHg. Compared to 5/20232, no significant changes.   Extended EKG monitoring by Zio patch 2 days and 22 hours starting 08/27/2021: Predominant rhythm is normal sinus rhythm with bundle branch block.  There were 2 patient activated events correlating with supraventricular and ventricular ectopics.  Rare PACs and rare PVCs, PVC burden <1%.  EKG: EKG 02/27/2022: Normal sinus rhythm at rate of 74 bpm, left bundle branch block.  No further analysis.  Compared to 01/02/2022, no significant change.  Assessment     ICD-10-CM   1. Chronic systolic heart failure (HCC)  I50.22 EKG 12-Lead    Ambulatory referral to Cardiac Electrophysiology    2. 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 Mixed hyperlipidemia  E78.2 rosuvastatin (CRESTOR) 20 MG tablet      Meds ordered this encounter  Medications   rosuvastatin (CRESTOR) 20 MG tablet    Sig: Take 1 tablet (20 mg total) by mouth daily.    Dispense:  90 tablet    Refill:  3    Medications Discontinued During This Encounter  Medication Reason   JARDIANCE 10 MG TABS tablet    Naproxen Sodium (ALEVE) 220 MG CAPS No longer needed (for PRN medications)     Orders Placed This  Encounter  Procedures   Ambulatory referral to Cardiac Electrophysiology    Referral Priority:   Routine    Referral Type:   Consultation    Referral Reason:   Specialty Services Required    Requested Specialty:   Cardiology    Number of Visits Requested:   1   EKG 12-Lead   Recommendations:   Rebecca [name]  is a 51 y.o. Caucasian female initially with history of LBBB at least dating back to 2013.  At that time she has had a negative nuclear stress test and echocardiogram revealed low normal LVEF at 45-50%.   Past medical history includes hyperthyroidism S/P RAI therapy in March 2018, depression, estrogen negative right breast cancer for which he underwent chemotherapy(Adjuvant chemotherapy with dose dense Adriamycin and Cytoxan x4)  and also bilateral mastectomy with reconstruction surgery on 11/18/2017. Surveillance echocardiogram due to chemotherapy attributed gradual decrease of LVEF to moderate to severe LV systolic dysfunction of 30 to 35%. 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  She is on guideline directed medical therapy.  Patient presents for a 4-week follow-up visit, overall since dietary changes and also starting [employer], she has lost 50 pounds in weight.   1. Chronic systolic heart failure ([place]) Patient has class II-III symptoms of fatigue and dyspnea, no overt heart failure findings on physical exam.  Unfortunately in spite of guideline directed medical therapy, she continues to have low LVEF.  I have recommended consideration for BiV pacemaker/left bundle branch block pacing versus ICD and referral made for electrophysiology.  2. Primary hypertension Blood pressure is well controlled in fact is very soft.  She is also losing weight and this is contributed significantly for improving blood pressure control.  Continue present medications.  3. 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  4.  Mixed hyperlipidemia Previously lipids were well controlled with LDL <100, however she has discontinued taking Crestor, I have prescribed a medication back.   In view of multiple medical comorbidity including but not limited to nonischemic cardiomyopathy, chronic systolic heart failure, class III symptoms related to cardiomyopathy, history of breast cancer, patient is planning to apply for permanent disability.  In view of the above, it would be appropriate to do so from cardiac standpoint.  I would like to see her back in 3 months for follow-up.   I spent 35 minutes with the patient.   Adrian Prows, MD, [facility] 02/27/2022, 6:09 PM Office: [phone]

## 2022-02-28 ENCOUNTER — Other Ambulatory Visit: Payer: Self-pay

## 2022-03-04 ENCOUNTER — Other Ambulatory Visit: Payer: Self-pay | Admitting: Cardiology

## 2022-03-04 ENCOUNTER — Ambulatory Visit (HOSPITAL_COMMUNITY)
Admission: RE | Admit: 2022-03-04 | Discharge: 2022-03-04 | Disposition: A | Discharge status: Home / Self Care | Payer: No Typology Code available for payment source | Source: Ambulatory Visit | Attending: Hematology and Oncology | Admitting: Hematology and Oncology

## 2022-03-04 ENCOUNTER — Inpatient Hospital Stay: Payer: No Typology Code available for payment source

## 2022-03-04 ENCOUNTER — Other Ambulatory Visit: Payer: Self-pay | Admitting: Internal Medicine

## 2022-03-04 ENCOUNTER — Other Ambulatory Visit: Payer: Self-pay

## 2022-03-04 ENCOUNTER — Inpatient Hospital Stay: Payer: No Typology Code available for payment source | Attending: Hematology and Oncology

## 2022-03-04 DIAGNOSIS — C50411 Malignant neoplasm of upper-outer quadrant of right female breast: Secondary | ICD-10-CM | POA: Diagnosis present

## 2022-03-04 DIAGNOSIS — Z171 Estrogen receptor negative status [ER-]: Secondary | ICD-10-CM | POA: Insufficient documentation

## 2022-03-04 DIAGNOSIS — Z79899 Other long term (current) drug therapy: Secondary | ICD-10-CM | POA: Insufficient documentation

## 2022-03-04 DIAGNOSIS — I5022 Chronic systolic (congestive) heart failure: Secondary | ICD-10-CM

## 2022-03-04 DIAGNOSIS — Z9013 Acquired absence of bilateral breasts and nipples: Secondary | ICD-10-CM | POA: Insufficient documentation

## 2022-03-04 LAB — CBC WITH DIFFERENTIAL (CANCER CENTER ONLY)
Abs Immature Granulocytes: 0.01 10*3/uL (ref 0.00–0.07)
Basophils Absolute: 0 10*3/uL (ref 0.0–0.1)
Basophils Relative: 0 %
Eosinophils Absolute: 0 10*3/uL (ref 0.0–0.5)
Eosinophils Relative: 0 %
HCT: 40.5 % (ref 36.0–46.0)
Hemoglobin: 13.4 g/dL (ref 12.0–15.0)
Immature Granulocytes: 0 %
Lymphocytes Relative: 34 %
Lymphs Abs: 2 10*3/uL (ref 0.7–4.0)
MCH: 27 pg (ref 26.0–34.0)
MCHC: 33.1 g/dL (ref 30.0–36.0)
MCV: 81.7 fL (ref 80.0–100.0)
Monocytes Absolute: 0.5 10*3/uL (ref 0.1–1.0)
Monocytes Relative: 9 %
Neutro Abs: 3.3 10*3/uL (ref 1.7–7.7)
Neutrophils Relative %: 57 %
Platelet Count: 226 10*3/uL (ref 150–400)
RBC: 4.96 MIL/uL (ref 3.87–5.11)
RDW: 14.7 % (ref 11.5–15.5)
WBC Count: 5.8 10*3/uL (ref 4.0–10.5)
nRBC: 0 % (ref 0.0–0.2)

## 2022-03-04 LAB — CMP (CANCER CENTER ONLY)
ALT: 13 U/L (ref 0–44)
AST: 14 U/L — ABNORMAL LOW (ref 15–41)
Albumin: 4.3 g/dL (ref 3.5–5.0)
Alkaline Phosphatase: 58 U/L (ref 38–126)
Anion gap: 6 (ref 5–15)
BUN: 12 mg/dL (ref 6–20)
CO2: 31 mmol/L (ref 22–32)
Calcium: 9.7 mg/dL (ref 8.9–10.3)
Chloride: 105 mmol/L (ref 98–111)
Creatinine: 1.07 mg/dL — ABNORMAL HIGH (ref 0.44–1.00)
GFR, Estimated: >60 mL/min (ref >60)
Glucose, Bld: 88 mg/dL (ref 70–99)
Potassium: 4.6 mmol/L (ref 3.5–5.1)
Sodium: 142 mmol/L (ref 135–145)
Total Bilirubin: 0.5 mg/dL (ref 0.3–1.2)
Total Protein: 6.9 g/dL (ref 6.5–8.1)

## 2022-03-04 MED ORDER — IOHEXOL 300 MG/ML  SOLN
100.0000 mL | Freq: Once | INTRAMUSCULAR | Status: AC | PRN
  Administered 2022-03-04: 100 mL via INTRAVENOUS

## 2022-03-06 ENCOUNTER — Inpatient Hospital Stay (HOSPITAL_BASED_OUTPATIENT_CLINIC_OR_DEPARTMENT_OTHER): Payer: No Typology Code available for payment source | Admitting: Hematology and Oncology

## 2022-03-06 ENCOUNTER — Other Ambulatory Visit: Payer: Self-pay

## 2022-03-06 VITALS — BP 96/65 | HR 84 | Temp 97.8°F | Resp 18 | Ht 70.0 in | Wt 201.3 lb

## 2022-03-06 DIAGNOSIS — C50411 Malignant neoplasm of upper-outer quadrant of right female breast: Secondary | ICD-10-CM

## 2022-03-06 DIAGNOSIS — Z79899 Other long term (current) drug therapy: Secondary | ICD-10-CM | POA: Diagnosis not present

## 2022-03-06 DIAGNOSIS — Z171 Estrogen receptor negative status [ER-]: Secondary | ICD-10-CM

## 2022-03-06 DIAGNOSIS — Z9013 Acquired absence of bilateral breasts and nipples: Secondary | ICD-10-CM | POA: Diagnosis not present

## 2022-03-06 NOTE — Assessment & Plan Note (Signed)
07/09/2017:Patient palpated a right breast mass which was evaluated by mammogram and ultrasound and a breast MRI which revealed 4.5 x 4 cm necrotic tumor with suspicious multiple right axillary lymph nodes; biopsy revealed IDC grade 3 ER 0%, PR 0%, HER-2 positive ratio 3.08, Ki-67 70%, T2NX stage IIa/IIb   Treatment plan based on multidisciplinary tumor board: 1. Neoadjuvant chemotherapy with TCH Perjeta 6 cycles followed by Herceptin and Perjeta maintenance for 1 year 2. Followed by bilateral mastectomies with targeted node dissection on the right 11/18/2017 3.  Due to significant residual disease, additional adjuvant chemo with Adriamycin and Cytoxan 12/18/2017-01/27/2018 4.  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  Stability of 2 to 3 mm right upper lobe nodule. Abd MRI 11/05/20: No biliary duct dil. No evidence of Met disease CT CAP 03/04/22:  ECHO 12/2021: EF 25-30%

## 2022-03-07 ENCOUNTER — Telehealth: Payer: Self-pay | Admitting: Hematology and Oncology

## 2022-03-07 ENCOUNTER — Other Ambulatory Visit: Payer: Self-pay

## 2022-03-07 ENCOUNTER — Telehealth: Payer: Self-pay

## 2022-03-07 DIAGNOSIS — R9389 Abnormal findings on diagnostic imaging of other specified body structures: Secondary | ICD-10-CM

## 2022-03-07 MED ORDER — NA SULFATE-K SULFATE-MG SULF 17.5-3.13-1.6 GM/177ML PO SOLN: ORAL | 0 refills | Status: DC

## 2022-03-07 NOTE — Telephone Encounter (Signed)
Spoke with pt and she is aware of Dr. Vena Rua recommendations. Pt scheduled for colon in the [facility] with Dr. Norman Herrlich 03/19/22 at 3pm. Pt to arrive in the [facility] at 2pm. Amb ref in epic. Instructions sent to pt via mychart. Script sent to pharmacy.

## 2022-03-07 NOTE — Telephone Encounter (Signed)
-----   Message from Jerene Bears, MD sent at 03/06/2022  5:57 PM EST ----- Regarding: RE: CT showing colon ST thickening Will do, thanks for the note. Ether Griffins, Please let patient know I got word from Dr. Lindi Adie after recent CT scan suggested possible "fullness" in the ascending colon near the ileocecal valve.  This may be secondary to "underdistention" or the fact that this portion of the colon was not fully distended.  This may not be a real finding.  That said, given this possible abnormality I would recommend that we repeat colonoscopy.  If she is agreeable this can be scheduled, if she wishes to discuss please make her an office visit. Colonoscopy indication: abnormal CT colon Thanks JMP  ----- Message ----- From: Nicholas Lose, MD Sent: 03/06/2022   2:53 PM EST To: Jerene Bears, MD Subject: CT showing colon ST thickening                 Ulice Dash, Please review her CT scan. It suggests a ST abnormality in the colon but it isnt clear that it is real or artifact. She had seen you a while ago and had a normal colonoscopy . Can you please recommend if she needs to see you or have another scope? Thanks [employer]

## 2022-03-07 NOTE — Telephone Encounter (Signed)
Scheduled appointment per 11/8 los. Left voicemail.

## 2022-03-08 ENCOUNTER — Other Ambulatory Visit: Payer: Self-pay

## 2022-03-10 ENCOUNTER — Other Ambulatory Visit: Payer: Self-pay

## 2022-03-12 ENCOUNTER — Encounter: Payer: Self-pay | Admitting: Certified Registered Nurse Anesthetist

## 2022-03-12 ENCOUNTER — Encounter: Payer: Self-pay | Admitting: Internal Medicine

## 2022-03-19 ENCOUNTER — Other Ambulatory Visit: Payer: Self-pay

## 2022-03-19 ENCOUNTER — Encounter: Payer: Self-pay | Admitting: Internal Medicine

## 2022-03-19 ENCOUNTER — Ambulatory Visit: Payer: No Typology Code available for payment source | Admitting: Internal Medicine

## 2022-03-19 ENCOUNTER — Encounter (HOSPITAL_COMMUNITY)
Admission: RE | Disposition: A | Discharge status: Home / Self Care | Payer: Self-pay | Source: Ambulatory Visit | Attending: Internal Medicine

## 2022-03-19 ENCOUNTER — Encounter (HOSPITAL_COMMUNITY): Payer: Self-pay | Admitting: Internal Medicine

## 2022-03-19 ENCOUNTER — Ambulatory Visit (HOSPITAL_COMMUNITY)
Admission: RE | Admit: 2022-03-19 | Discharge: 2022-03-19 | Disposition: A | Discharge status: Home / Self Care | Payer: No Typology Code available for payment source | Source: Ambulatory Visit | Attending: Internal Medicine | Admitting: Internal Medicine

## 2022-03-19 VITALS — BP 87/53 | HR 87 | Temp 97.3°F | Ht 70.0 in | Wt 201.0 lb

## 2022-03-19 DIAGNOSIS — R933 Abnormal findings on diagnostic imaging of other parts of digestive tract: Secondary | ICD-10-CM

## 2022-03-19 DIAGNOSIS — K5730 Diverticulosis of large intestine without perforation or abscess without bleeding: Secondary | ICD-10-CM | POA: Insufficient documentation

## 2022-03-19 DIAGNOSIS — I429 Cardiomyopathy, unspecified: Secondary | ICD-10-CM | POA: Insufficient documentation

## 2022-03-19 HISTORY — PX: COLONOSCOPY WITH PROPOFOL: SHX5780

## 2022-03-19 LAB — GLUCOSE, CAPILLARY: Glucose-Capillary: 83 mg/dL (ref 70–99)

## 2022-03-19 SURGERY — COLONOSCOPY WITH PROPOFOL
Anesthesia: Moderate Sedation

## 2022-03-19 MED ORDER — MIDAZOLAM HCL (PF) 5 MG/ML IJ SOLN
INTRAMUSCULAR | Status: AC
  Filled 2022-03-19: qty 2

## 2022-03-19 MED ORDER — SODIUM CHLORIDE 0.9 % IV SOLN: 500.0000 mL | INTRAVENOUS | Status: DC

## 2022-03-19 MED ORDER — FENTANYL CITRATE (PF) 100 MCG/2ML IJ SOLN
INTRAMUSCULAR | Status: AC
  Filled 2022-03-19: qty 4

## 2022-03-19 MED ORDER — MIDAZOLAM HCL (PF) 5 MG/ML IJ SOLN
INTRAMUSCULAR | Status: DC | PRN
  Administered 2022-03-19 (×2): 2 mg via INTRAVENOUS

## 2022-03-19 SURGICAL SUPPLY — 22 items

## 2022-03-19 NOTE — Progress Notes (Unsigned)
  The note originally documented on this encounter has been moved the the encounter in which it belongs.  

## 2022-03-19 NOTE — H&P (Unsigned)
GASTROENTEROLOGY PROCEDURE H&P NOTE   Primary Care Physician: Elby Showers, MD    Reason for Procedure:  Abnormal CT scan of the abdomen suggesting ascending colon thickening near the junction with ileocecal valve  Plan:    Colonoscopy  Patient was initially felt appropriate for endoscopic procedures in the [place] however we determined that she had a recent echocardiogram with EF 25 to 30%.  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  She is not having chest pain or shortness of breath however the decision was made to move the patient to the outpatient setting for unsedated colonoscopy versus minimal sedation.  The nature of the procedure, as well as the risks, benefits, and alternatives were carefully and thoroughly reviewed with the patient. Ample time for discussion and questions allowed. The patient understood, was satisfied, and agreed to proceed.     HPI: Mckay Brandt is a 51 y.o. female who presents for colonoscopy.  Medical history as below.  Tolerated the prep.  No recent chest pain or shortness of breath.  No abdominal pain today.  Past Medical History:  Diagnosis Date   Allergy    constant hives post chemo therapy.  All allergen tests have been neg.   Anxiety    Breast cancer ([place])    Coronary artery disease    Diabetes mellitus without complication ([place])    Diverticulitis    GERD (gastroesophageal reflux disease)    Hiatal hernia    Hyperlipidemia    Hypertension    Hypothyroidism    LBBB (left bundle branch block)    Pneumonia     Past Surgical History:  Procedure Laterality Date   BREAST RECONSTRUCTION WITH PLACEMENT OF TISSUE EXPANDER AND ALLODERM Bilateral 11/18/2017   Procedure: BILATERAL BREAST RECONSTRUCTION WITH PLACEMENT OF TISSUE EXPANDER AND ALLODERM;  Surgeon: Irene Limbo, MD;  Location: [place];  Service: Plastics;  Laterality: Bilateral;   CESAREAN SECTION  2007/2010   x2   COLONOSCOPY  2017    LAPAROSCOPIC TUBAL LIGATION  06/12/2011   Procedure: LAPAROSCOPIC TUBAL LIGATION;  Surgeon: Daria Pastures, MD;  Location: [facility];  Service: Gynecology;  Laterality: N/A;  filshie clip   LIPOSUCTION WITH LIPOFILLING Bilateral 02/24/2018   Procedure: LIPOFILLING FROM ABDOMEN TO BILATERAL CHEST;  Surgeon: Irene Limbo, MD;  Location: [place];  Service: Plastics;  Laterality: Bilateral;   LUMBAR LAMINECTOMY/ DECOMPRESSION WITH MET-RX Left 02/08/2022   Procedure: Left Lumbar Five-Sacral One Microdiscectomy with Metrex;  Surgeon: Kristeen Miss, MD;  Location: [place];  Service: Neurosurgery;  Laterality: Left;  3C/RM 20   MASTECTOMY W/ SENTINEL NODE BIOPSY Bilateral 11/18/2017   Procedure: BILATERAL TOTAL MASTECTOMIES WITH RIGHT SENTINEL LYMPH NODE BIOPSY;  Surgeon: Rolm Bookbinder, MD;  Location: [place];  Service: General;  Laterality: Bilateral;   mini tuck  2012   abdominal   PORT-A-CATH REMOVAL Right 01/05/2019   PORTACATH PLACEMENT Right 07/15/2017   Procedure: INSERTION PORT-A-CATH WITH ULTRASOUND;  Surgeon: Rolm Bookbinder, MD;  Location: [place];  Service: General;  Laterality: Right;   REMOVAL OF BILATERAL TISSUE EXPANDERS WITH PLACEMENT OF BILATERAL BREAST IMPLANTS Bilateral 02/24/2018   Procedure: REMOVAL OF BILATERAL TISSUE EXPANDERS WITH PLACEMENT OF BILATERAL BREAST IMPLANTS;  Surgeon: Irene Limbo, MD;  Location: [place];  Service: Plastics;  Laterality: Bilateral;   right breast cancer     upper-outer quadrant    right ear surg  03/2008   Mohs   TUBAL LIGATION  US ECHOCARDIOGRAPHY  12-30-2007   EF 55-60%   WISDOM TOOTH EXTRACTION      Prior to Admission medications   Medication Sig Start Date End Date Taking? Authorizing Provider  Na Sulfate-K Sulfate-Mg Sulf 17.5-3.13-1.6 GM/177ML SOLN Take as directed 03/07/22   Brendia Dampier, Lajuan Lines, MD  Ascorbic Acid (VITAMIN C PO) Take 1 tablet by mouth daily.  gummy    [provider]  BD INSULIN SYRINGE U/F 31G X 5/16" 0.5 ML MISC USE AS DIRECTED 03/12/21   Elby Showers, MD  BD PEN NEEDLE NANO 2ND GEN 32G X 4 MM MISC USE AS DIRECTED 09/30/21   Elby Showers, MD  carvedilol (COREG) 12.5 MG tablet TAKE 1 TABLET BY MOUTH TWICE A DAY 02/13/22   Adrian Prows, MD  diazepam (VALIUM) 5 MG tablet Take 1 tablet (5 mg total) by mouth every 6 (six) hours as needed for muscle spasms. 02/08/22 02/08/23  Kristeen Miss, MD  ENTRESTO 97-103 MG Take 1 tablet by mouth 2 (two) times daily. 02/21/22   Adrian Prows, MD  EPINEPHrine 0.3 mg/0.3 mL IJ SOAJ injection Use as directed for life threatening allergic reactions only Patient taking differently: Inject 0.3 mg into the muscle as needed for anaphylaxis. Use as directed for life threatening allergic reactions only 07/26/19   Kozlow, Donnamarie Poag, MD  esomeprazole (NEXIUM) 40 MG capsule TAKE 1 CAPSULE (40 MG TOTAL) BY MOUTH DAILY AT 12 NOON. 01/02/22   Nicholas Lose, MD  furosemide (LASIX) 40 MG tablet TAKE 1 TABLET BY MOUTH EVERY DAY AS NEEDED FOR FLUID *SHORTNESS OF BREATH* 03/04/22   Adrian Prows, MD  ibuprofen (ADVIL) 200 MG tablet Take 400 mg by mouth every 6 (six) hours as needed for mild pain or moderate pain.    [provider]  levothyroxine (SYNTHROID) 150 MCG tablet TAKE 1 TABLET BY MOUTH EVERY DAY 11/26/21   Elby Showers, MD  LORazepam (ATIVAN) 1 MG tablet TAKE 1 TABLET BY MOUTH TWICE A DAY AS NEEDED 03/04/22   Elby Showers, MD  montelukast (SINGULAIR) 10 MG tablet TAKE 1 TABLET BY MOUTH EVERYDAY AT BEDTIME 09/14/21   Elby Showers, MD  pregabalin (LYRICA) 75 MG capsule Take 75 mg by mouth 2 (two) times daily. Patient not taking: Reported on 02/27/2022 01/30/22   [provider]  rosuvastatin (CRESTOR) 20 MG tablet Take 1 tablet (20 mg total) by mouth daily. 02/27/22   Adrian Prows, MD  testosterone cypionate (DEPO-TESTOSTERONE) 200 MG/ML injection Inject 200 mg into the muscle every 3 (three) months.  02/04/22   [provider]  tirzepatide Darcel Bayley) 15 MG/0.5ML Pen Inject 15 mg into the skin once a week. 10/22/21   Adrian Prows, MD  TRESIBA FLEXTOUCH 100 UNIT/ML FlexTouch Pen INJECT 10 UNITS INTO THE SKIN DAILY. Patient not taking: Reported on 02/27/2022 09/14/21   Elby Showers, MD  VAGIFEM 10 MCG TABS vaginal tablet Place 1 tablet vaginally 2 (two) times a week. 02/06/22   [provider]    Current Outpatient Medications  Medication Sig Dispense Refill   Na Sulfate-K Sulfate-Mg Sulf 17.5-3.13-1.6 GM/177ML SOLN Take as directed 354 mL 0   Ascorbic Acid (VITAMIN C PO) Take 1 tablet by mouth daily. gummy     BD INSULIN SYRINGE U/F 31G X 5/16" 0.5 ML MISC USE AS DIRECTED 300 each 3   BD PEN NEEDLE NANO 2ND GEN 32G X 4 MM MISC USE AS DIRECTED 100 each 3   carvedilol (COREG) 12.5 MG tablet TAKE  1 TABLET BY MOUTH TWICE A DAY 180 tablet 3   diazepam (VALIUM) 5 MG tablet Take 1 tablet (5 mg total) by mouth every 6 (six) hours as needed for muscle spasms. 30 tablet 0   ENTRESTO 97-103 MG Take 1 tablet by mouth 2 (two) times daily. 180 tablet 1   EPINEPHrine 0.3 mg/0.3 mL IJ SOAJ injection Use as directed for life threatening allergic reactions only (Patient taking differently: Inject 0.3 mg into the muscle as needed for anaphylaxis. Use as directed for life threatening allergic reactions only) 2 each 3   esomeprazole (NEXIUM) 40 MG capsule TAKE 1 CAPSULE (40 MG TOTAL) BY MOUTH DAILY AT 12 NOON. 90 capsule 3   furosemide (LASIX) 40 MG tablet TAKE 1 TABLET BY MOUTH EVERY DAY AS NEEDED FOR FLUID *SHORTNESS OF BREATH* 30 tablet 1   ibuprofen (ADVIL) 200 MG tablet Take 400 mg by mouth every 6 (six) hours as needed for mild pain or moderate pain.     levothyroxine (SYNTHROID) 150 MCG tablet TAKE 1 TABLET BY MOUTH EVERY DAY 90 tablet 2   LORazepam (ATIVAN) 1 MG tablet TAKE 1 TABLET BY MOUTH TWICE A DAY AS NEEDED 60 tablet 5   montelukast (SINGULAIR) 10 MG tablet TAKE 1 TABLET BY MOUTH  EVERYDAY AT BEDTIME 90 tablet 1   pregabalin (LYRICA) 75 MG capsule Take 75 mg by mouth 2 (two) times daily. (Patient not taking: Reported on 02/27/2022)     rosuvastatin (CRESTOR) 20 MG tablet Take 1 tablet (20 mg total) by mouth daily. 90 tablet 3   testosterone cypionate (DEPO-TESTOSTERONE) 200 MG/ML injection Inject 200 mg into the muscle every 3 (three) months.     tirzepatide (MOUNJARO) 15 MG/0.5ML Pen Inject 15 mg into the skin once a week. 2 mL 6   TRESIBA FLEXTOUCH 100 UNIT/ML FlexTouch Pen INJECT 10 UNITS INTO THE SKIN DAILY. (Patient not taking: Reported on 02/27/2022) 3 mL 1   VAGIFEM 10 MCG TABS vaginal tablet Place 1 tablet vaginally 2 (two) times a week.     Current Facility-Administered Medications  Medication Dose Route Frequency Provider Last Rate Last Admin   0.9 %  sodium chloride infusion  500 mL Intravenous Continuous Blessen Kimbrough, Lajuan Lines, MD        Allergies as of 03/19/2022   (No Known Allergies)    Family History  Problem Relation Age of Onset   Lymphoma Father    Cancer Father        lymphoma   Diabetes Brother    Diabetes Mother    Liver disease Mother        NASH, d. 76   Asthma Mother    CAD Maternal Grandmother    CAD Maternal Grandfather    Stomach cancer Neg Hx    Colon cancer Neg Hx    Colon polyps Neg Hx    Esophageal cancer Neg Hx    Rectal cancer Neg Hx     Social History   Socioeconomic History   Marital status: Married    Spouse name: Not on file   Number of children: 2   Years of education: Not on file   Highest education level: Not on file  Occupational History   Occupation: [employer]  Tobacco Use   Smoking status: Former    Packs/day: 0.25    Years: 6.00    Total pack years: 1.50    Types: Cigarettes    Quit date: 03/29/2005    Years since quitting: 16.9   Smokeless  tobacco: Never  Vaping Use   Vaping Use: Never used  Substance and Sexual Activity   Alcohol use: Yes    Comment: socially   Drug use: No   Sexual activity: Yes     Birth control/protection: Surgical    Comment: BTL  Other Topics Concern   Not on file  Social History Narrative   ** Merged History Encounter **       Social Determinants of Health   Financial Resource Strain: Not on file  Food Insecurity: Not on file  Transportation Needs: Not on file  Physical Activity: Not on file  Stress: Not on file  Social Connections: Not on file  Intimate Partner Violence: Not on file    Physical Exam: Vital signs in last 24 hours: _0   (LMP Unknown)  GEN: NAD EYE: Sclerae anicteric ENT: MMM CV: Non-tachycardic Pulm: CTA b/l GI: Soft, NT/ND NEURO:  Alert & Oriented x 3   Zenovia Jarred, MD [facility]  03/19/2022 2:28 PM

## 2022-03-19 NOTE — Discharge Instructions (Signed)

## 2022-03-19 NOTE — Op Note (Signed)
[facility] Patient Name: Rebecca [name] Procedure Date: 03/19/2022 MRN: [id number] Attending MD: Jerene Bears , MD, [id number] Date of Birth: 1970-09-12 CSN: [id number] Age: 51 Admit Type: Outpatient Procedure:                Colonoscopy Indications:              Abnormal CT of the GI tract suggestive of ascending                            colonic thickening, no GI complaints Providers:                Lajuan Lines. Hilarie Fredrickson, MD, Benetta Spar, Technician,                            Jeanella Cara, RN Referring MD:             Nicholas Lose Medicines:                Midazolam 4 mg IV Complications:            No immediate complications. Estimated Blood Loss:     Estimated blood loss: none. Procedure:                Pre-Anesthesia Assessment:                           - Prior to the procedure, a History and Physical                            was performed, and patient medications and                            allergies were reviewed. The patient's tolerance of                            previous anesthesia was also reviewed. The risks                            and benefits of the procedure and the sedation                            options and risks were discussed with the patient.                            All questions were answered, and informed consent                            was obtained. Prior Anticoagulants: The patient has                            taken no anticoagulant or antiplatelet agents. ASA                            Grade Assessment: III - A patient with severe  systemic disease. After reviewing the risks and                            benefits, the patient was deemed in satisfactory                            condition to undergo the procedure.                           After obtaining informed consent, the colonoscope                            was passed under direct vision. Throughout the                             procedure, the patient's blood pressure, pulse, and                            oxygen saturations were monitored continuously. The                            PCF-HQ190L ([id number]) Olympus colonoscope was                            introduced through the anus and advanced to the                            terminal ileum. The colonoscopy was performed                            without difficulty. The patient tolerated the                            procedure well. The quality of the bowel                            preparation was good. The terminal ileum, ileocecal                            valve, appendiceal orifice, and rectum were                            photographed. Scope In: 4:43:47 PM Scope Out: 4:59:36 PM Scope Withdrawal Time: 0 hours 8 minutes 2 seconds  Total Procedure Duration: 0 hours 15 minutes 49 seconds  Findings:      The digital rectal exam was normal.      The terminal ileum appeared normal.      Multiple medium-mouthed and small-mouthed diverticula were found in the       sigmoid colon and descending colon.      The exam was otherwise without abnormality on direct and retroflexion       views. Impression:               - The examined portion of the ileum was normal.                           -  Moderate diverticulosis in the sigmoid colon and                            in the descending colon.                           - The examination was otherwise normal on direct                            and retroflexion views.                           - No cecal or ascending colonic abnormalities to                            correspond with recent CT abdomen.                           - No specimens collected. Moderate Sedation:      N/A Recommendation:           - Patient has a contact number available for                            emergencies. The signs and symptoms of potential                            delayed complications were discussed with the                             patient. Return to normal activities tomorrow.                            Written discharge instructions were provided to the                            patient.                           - Resume previous diet.                           - Continue present medications.                           - Repeat colonoscopy in 10 years for screening                            purposes (replace previous 10 year recall). Procedure Code(s):        --- Professional ---                           [phone], Colonoscopy, flexible; diagnostic, including                            collection of specimen(s) by brushing or washing,  when performed (separate procedure) Diagnosis Code(s):        --- Professional ---                           R93.3, Abnormal findings on diagnostic imaging of                            other parts of digestive tract CPT copyright 2022 American Medical Association. All rights reserved. The codes documented in this report are preliminary and upon coder review may  be revised to meet current compliance requirements. Jerene Bears, MD 03/19/2022 5:03:15 PM This report has been signed electronically. Number of Addenda: 0

## 2022-03-22 ENCOUNTER — Encounter (HOSPITAL_COMMUNITY): Payer: Self-pay | Admitting: Internal Medicine

## 2022-04-02 ENCOUNTER — Encounter: Payer: Self-pay | Admitting: Internal Medicine

## 2022-04-02 ENCOUNTER — Ambulatory Visit: Payer: No Typology Code available for payment source | Attending: Internal Medicine | Admitting: Internal Medicine

## 2022-04-02 VITALS — BP 98/64 | HR 65 | Ht 69.0 in | Wt 199.0 lb

## 2022-04-02 DIAGNOSIS — I10 Essential (primary) hypertension: Secondary | ICD-10-CM

## 2022-04-02 DIAGNOSIS — I5022 Chronic systolic (congestive) heart failure: Secondary | ICD-10-CM | POA: Insufficient documentation

## 2022-04-02 NOTE — Patient Instructions (Addendum)
Medication Instructions:  Your physician recommends that you continue on your current medications as directed. Please refer to the Current Medication list given to you today.  *If you need a refill on your cardiac medications before your next appointment, please call your pharmacy*  Lab Work: You will have blood drawn today; CBC BMET  Testing/Procedures: None ordered.  Follow-Up: Dr. Lovena Le is working on trying to schedule a MDT BiV ICD before your insurance ends on Apr 12, 2022.   We will reach out to patient if / when a date becomes available.  Open dates in January include:  January - 3, 4, 8, 15, 16, 22, 29, 31  See ICD Device booklet;

## 2022-04-02 NOTE — Progress Notes (Signed)
HPI Rebecca [name] is referred today by Dr. Einar Gip for consideration for ICD insertion. She is a pleasant 51 yo woman with a long h/o LBBB who then was found to have breast CA and was treated with chemotherapy and then developed worsening LV dysfunction and progressively worse CHF despite being treated with GDMT. She has not had syncope. She has a h/o obesity but has been losing weight. She has class 3 CHF symptoms. She has an EF of 25% by echo. She admits to some medical non-compliance remotely but none recently.  No Known Allergies   Current Outpatient Medications  Medication Sig Dispense Refill   carvedilol (COREG) 12.5 MG tablet TAKE 1 TABLET BY MOUTH TWICE A DAY 180 tablet 3   diazepam (VALIUM) 5 MG tablet Take 1 tablet (5 mg total) by mouth every 6 (six) hours as needed for muscle spasms. 30 tablet 0   ENTRESTO 97-103 MG Take 1 tablet by mouth 2 (two) times daily. 180 tablet 1   EPINEPHrine 0.3 mg/0.3 mL IJ SOAJ injection Use as directed for life threatening allergic reactions only (Patient taking differently: Inject 0.3 mg into the muscle as needed for anaphylaxis. Use as directed for life threatening allergic reactions only) 2 each 3   esomeprazole (NEXIUM) 40 MG capsule TAKE 1 CAPSULE (40 MG TOTAL) BY MOUTH DAILY AT 12 NOON. 90 capsule 3   furosemide (LASIX) 40 MG tablet TAKE 1 TABLET BY MOUTH EVERY DAY AS NEEDED FOR FLUID *SHORTNESS OF BREATH* 30 tablet 1   levothyroxine (SYNTHROID) 150 MCG tablet TAKE 1 TABLET BY MOUTH EVERY DAY 90 tablet 2   LORazepam (ATIVAN) 1 MG tablet TAKE 1 TABLET BY MOUTH TWICE A DAY AS NEEDED 60 tablet 5   montelukast (SINGULAIR) 10 MG tablet TAKE 1 TABLET BY MOUTH EVERYDAY AT BEDTIME 90 tablet 1   pregabalin (LYRICA) 75 MG capsule Take 75 mg by mouth 2 (two) times daily.     rosuvastatin (CRESTOR) 20 MG tablet Take 1 tablet (20 mg total) by mouth daily. 90 tablet 3   testosterone cypionate (DEPO-TESTOSTERONE) 200 MG/ML injection Inject 200 mg into the  muscle every 3 (three) months.     tirzepatide (MOUNJARO) 15 MG/0.5ML Pen Inject 15 mg into the skin once a week. 2 mL 6   VAGIFEM 10 MCG TABS vaginal tablet Place 1 tablet vaginally 2 (two) times a week.     Current Facility-Administered Medications  Medication Dose Route Frequency Provider Last Rate Last Admin   0.9 %  sodium chloride infusion  500 mL Intravenous Continuous Pyrtle, Lajuan Lines, MD         Past Medical History:  Diagnosis Date   Allergy    constant hives post chemo therapy.  All allergen tests have been neg.   Anxiety    Breast cancer ([place])    Coronary artery disease    Diabetes mellitus without complication ([place])    Diverticulitis    GERD (gastroesophageal reflux disease)    Heart failure ([place]) 01/02/2022   EF 25 to 30%   Hiatal hernia    Hyperlipidemia    Hypertension    Hypothyroidism    LBBB (left bundle branch block)    Pneumonia     ROS:   All systems reviewed and negative except as noted in the HPI.   Past Surgical History:  Procedure Laterality Date   BREAST RECONSTRUCTION WITH PLACEMENT OF TISSUE EXPANDER AND ALLODERM Bilateral 11/18/2017   Procedure: BILATERAL BREAST RECONSTRUCTION WITH PLACEMENT  OF TISSUE EXPANDER AND ALLODERM;  Surgeon: Irene Limbo, MD;  Location: [place];  Service: Plastics;  Laterality: Bilateral;   CESAREAN SECTION  2007/2010   x2   COLONOSCOPY  2017   COLONOSCOPY WITH PROPOFOL N/A 03/19/2022   Procedure: COLONOSCOPY WITH PROPOFOL;  Surgeon: Jerene Bears, MD;  Location: [facility];  Service: Gastroenterology;  Laterality: N/A;   LAPAROSCOPIC TUBAL LIGATION  06/12/2011   Procedure: LAPAROSCOPIC TUBAL LIGATION;  Surgeon: Daria Pastures, MD;  Location: [facility];  Service: Gynecology;  Laterality: N/A;  filshie clip   LIPOSUCTION WITH LIPOFILLING Bilateral 02/24/2018   Procedure: LIPOFILLING FROM ABDOMEN TO BILATERAL CHEST;  Surgeon: Irene Limbo, MD;  Location: [place];  Service:  Plastics;  Laterality: Bilateral;   LUMBAR LAMINECTOMY/ DECOMPRESSION WITH MET-RX Left 02/08/2022   Procedure: Left Lumbar Five-Sacral One Microdiscectomy with Metrex;  Surgeon: Kristeen Miss, MD;  Location: [place];  Service: Neurosurgery;  Laterality: Left;  3C/RM 20   MASTECTOMY W/ SENTINEL NODE BIOPSY Bilateral 11/18/2017   Procedure: BILATERAL TOTAL MASTECTOMIES WITH RIGHT SENTINEL LYMPH NODE BIOPSY;  Surgeon: Rolm Bookbinder, MD;  Location: [place];  Service: General;  Laterality: Bilateral;   mini tuck  2012   abdominal   PORT-A-CATH REMOVAL Right 01/05/2019   PORTACATH PLACEMENT Right 07/15/2017   Procedure: INSERTION PORT-A-CATH WITH ULTRASOUND;  Surgeon: Rolm Bookbinder, MD;  Location: [place];  Service: General;  Laterality: Right;   REMOVAL OF BILATERAL TISSUE EXPANDERS WITH PLACEMENT OF BILATERAL BREAST IMPLANTS Bilateral 02/24/2018   Procedure: REMOVAL OF BILATERAL TISSUE EXPANDERS WITH PLACEMENT OF BILATERAL BREAST IMPLANTS;  Surgeon: Irene Limbo, MD;  Location: [place];  Service: Plastics;  Laterality: Bilateral;   right breast cancer     upper-outer quadrant    right ear surg  03/2008   Mohs   TUBAL LIGATION     US ECHOCARDIOGRAPHY  12-30-2007   EF 55-60%   WISDOM TOOTH EXTRACTION       Family History  Problem Relation Age of Onset   Lymphoma Father    Cancer Father        lymphoma   Diabetes Brother    Diabetes Mother    Liver disease Mother        NASH, d. 28   Asthma Mother    CAD Maternal Grandmother    CAD Maternal Grandfather    Stomach cancer Neg Hx    Colon cancer Neg Hx    Colon polyps Neg Hx    Esophageal cancer Neg Hx    Rectal cancer Neg Hx      Social History   Socioeconomic History   Marital status: Married    Spouse name: Not on file   Number of children: 2   Years of education: Not on file   Highest education level: Not on file  Occupational History   Occupation:  [employer] fast [employer]er  Tobacco Use   Smoking status: Former    Packs/day: 0.25    Years: 6.00    Total pack years: 1.50    Types: Cigarettes    Quit date: 03/29/2005    Years since quitting: 17.0   Smokeless tobacco: Never  Vaping Use   Vaping Use: Never used  Substance and Sexual Activity   Alcohol use: Yes    Comment: socially   Drug use: No   Sexual activity: Yes    Birth control/protection: Surgical    Comment: BTL  Other Topics Concern  Not on file  Social History Narrative   ** Merged History Encounter **       Social Determinants of Health   Financial Resource Strain: Not on file  Food Insecurity: Not on file  Transportation Needs: Not on file  Physical Activity: Not on file  Stress: Not on file  Social Connections: Not on file  Intimate Partner Violence: Not on file     BP 98/64   Pulse 65   Ht _0  (1.753 m)   Wt 199 lb (90.3 kg)   LMP  (LMP Unknown)   SpO2 96%   BMI 29.39 kg/m   Physical Exam:  Well appearing middle aged woman, NAD HEENT: Unremarkable Neck:  No JVD, no thyromegally Lymphatics:  No adenopathy Back:  No CVA tenderness Lungs:  Clear with no wheezes, port o cath incision on right chest. HEART:  Regular rate rhythm, no murmurs, no rubs, no clicks Abd:  soft, positive bowel sounds, no organomegally, no rebound, no guarding Ext:  2 plus pulses, no edema, no cyanosis, no clubbing Skin:  No rashes no nodules Neuro:  CN II through XII intact, motor grossly intact  EKG - nsr with LBBB, QRS 150 ms   Assess/Plan: Chronic systolic heart failure - we have discussed the pro's and con's and risks/benefits/ of biv ICD insertion. She would like to proceed. She has been on maximal medical therapy with Entresto and coreg and lasix LBBB - with above, I would recommend insertion of a  biv ICD.  Carleene Overlie Hilda Wexler,MD

## 2022-04-02 NOTE — H&P (View-Only) (Signed)
HPI Rebecca [name] is referred today by Dr. Einar Gip for consideration for ICD insertion. She is a pleasant 51 yo woman with a long h/o LBBB who then was found to have breast CA and was treated with chemotherapy and then developed worsening LV dysfunction and progressively worse CHF despite being treated with GDMT. She has not had syncope. She has a h/o obesity but has been losing weight. She has class 3 CHF symptoms. She has an EF of 25% by echo. She admits to some medical non-compliance remotely but none recently.  No Known Allergies   Current Outpatient Medications  Medication Sig Dispense Refill   carvedilol (COREG) 12.5 MG tablet TAKE 1 TABLET BY MOUTH TWICE A DAY 180 tablet 3   diazepam (VALIUM) 5 MG tablet Take 1 tablet (5 mg total) by mouth every 6 (six) hours as needed for muscle spasms. 30 tablet 0   ENTRESTO 97-103 MG Take 1 tablet by mouth 2 (two) times daily. 180 tablet 1   EPINEPHrine 0.3 mg/0.3 mL IJ SOAJ injection Use as directed for life threatening allergic reactions only (Patient taking differently: Inject 0.3 mg into the muscle as needed for anaphylaxis. Use as directed for life threatening allergic reactions only) 2 each 3   esomeprazole (NEXIUM) 40 MG capsule TAKE 1 CAPSULE (40 MG TOTAL) BY MOUTH DAILY AT 12 NOON. 90 capsule 3   furosemide (LASIX) 40 MG tablet TAKE 1 TABLET BY MOUTH EVERY DAY AS NEEDED FOR FLUID *SHORTNESS OF BREATH* 30 tablet 1   levothyroxine (SYNTHROID) 150 MCG tablet TAKE 1 TABLET BY MOUTH EVERY DAY 90 tablet 2   LORazepam (ATIVAN) 1 MG tablet TAKE 1 TABLET BY MOUTH TWICE A DAY AS NEEDED 60 tablet 5   montelukast (SINGULAIR) 10 MG tablet TAKE 1 TABLET BY MOUTH EVERYDAY AT BEDTIME 90 tablet 1   pregabalin (LYRICA) 75 MG capsule Take 75 mg by mouth 2 (two) times daily.     rosuvastatin (CRESTOR) 20 MG tablet Take 1 tablet (20 mg total) by mouth daily. 90 tablet 3   testosterone cypionate (DEPO-TESTOSTERONE) 200 MG/ML injection Inject 200 mg into the  muscle every 3 (three) months.     tirzepatide (MOUNJARO) 15 MG/0.5ML Pen Inject 15 mg into the skin once a week. 2 mL 6   VAGIFEM 10 MCG TABS vaginal tablet Place 1 tablet vaginally 2 (two) times a week.     Current Facility-Administered Medications  Medication Dose Route Frequency Provider Last Rate Last Admin   0.9 %  sodium chloride infusion  500 mL Intravenous Continuous Pyrtle, Lajuan Lines, MD         Past Medical History:  Diagnosis Date   Allergy    constant hives post chemo therapy.  All allergen tests have been neg.   Anxiety    Breast cancer ([place])    Coronary artery disease    Diabetes mellitus without complication ([place])    Diverticulitis    GERD (gastroesophageal reflux disease)    Heart failure ([place]) 01/02/2022   EF 25 to 30%   Hiatal hernia    Hyperlipidemia    Hypertension    Hypothyroidism    LBBB (left bundle branch block)    Pneumonia     ROS:   All systems reviewed and negative except as noted in the HPI.   Past Surgical History:  Procedure Laterality Date   BREAST RECONSTRUCTION WITH PLACEMENT OF TISSUE EXPANDER AND ALLODERM Bilateral 11/18/2017   Procedure: BILATERAL BREAST RECONSTRUCTION WITH PLACEMENT  OF TISSUE EXPANDER AND ALLODERM;  Surgeon: Irene Limbo, MD;  Location: [facility];  Service: Plastics;  Laterality: Bilateral;   CESAREAN SECTION  2007/2010   x2   COLONOSCOPY  2017   COLONOSCOPY WITH PROPOFOL N/A 03/19/2022   Procedure: COLONOSCOPY WITH PROPOFOL;  Surgeon: Jerene Bears, MD;  Location: [facility];  Service: Gastroenterology;  Laterality: N/A;   LAPAROSCOPIC TUBAL LIGATION  06/12/2011   Procedure: LAPAROSCOPIC TUBAL LIGATION;  Surgeon: Daria Pastures, MD;  Location: [facility];  Service: Gynecology;  Laterality: N/A;  filshie clip   LIPOSUCTION WITH LIPOFILLING Bilateral 02/24/2018   Procedure: LIPOFILLING FROM ABDOMEN TO BILATERAL CHEST;  Surgeon: Irene Limbo, MD;  Location: [facility];  Service:  Plastics;  Laterality: Bilateral;   LUMBAR LAMINECTOMY/ DECOMPRESSION WITH MET-RX Left 02/08/2022   Procedure: Left Lumbar Five-Sacral One Microdiscectomy with Metrex;  Surgeon: Kristeen Miss, MD;  Location: [facility];  Service: Neurosurgery;  Laterality: Left;  3C/RM 20   MASTECTOMY W/ SENTINEL NODE BIOPSY Bilateral 11/18/2017   Procedure: BILATERAL TOTAL MASTECTOMIES WITH RIGHT SENTINEL LYMPH NODE BIOPSY;  Surgeon: Rolm Bookbinder, MD;  Location: [facility];  Service: General;  Laterality: Bilateral;   mini tuck  2012   abdominal   PORT-A-CATH REMOVAL Right 01/05/2019   PORTACATH PLACEMENT Right 07/15/2017   Procedure: INSERTION PORT-A-CATH WITH ULTRASOUND;  Surgeon: Rolm Bookbinder, MD;  Location: [facility];  Service: General;  Laterality: Right;   REMOVAL OF BILATERAL TISSUE EXPANDERS WITH PLACEMENT OF BILATERAL BREAST IMPLANTS Bilateral 02/24/2018   Procedure: REMOVAL OF BILATERAL TISSUE EXPANDERS WITH PLACEMENT OF BILATERAL BREAST IMPLANTS;  Surgeon: Irene Limbo, MD;  Location: [facility];  Service: Plastics;  Laterality: Bilateral;   right breast cancer     upper-outer quadrant    right ear surg  03/2008   Mohs   TUBAL LIGATION     US ECHOCARDIOGRAPHY  12-30-2007   EF 55-60%   WISDOM TOOTH EXTRACTION       Family History  Problem Relation Age of Onset   Lymphoma Father    Cancer Father        lymphoma   Diabetes Brother    Diabetes Mother    Liver disease Mother        NASH, d. 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  Tobacco Use   Smoking status: Former    Packs/day: 0.25    Years: 6.00    Total pack years: 1.50    Types: Cigarettes    Quit date: 03/29/2005    Years since quitting: 17.0   Smokeless tobacco: Never  Vaping Use   Vaping Use: Never used  Substance and Sexual Activity   Alcohol use: Yes    Comment: socially   Drug use: No   Sexual activity: Yes    Birth control/protection: Surgical    Comment: BTL  Other Topics Concern  Not on file  Social History Narrative   ** Merged History Encounter **       Social Determinants of Health   Financial Resource Strain: Not on file  Food Insecurity: Not on file  Transportation Needs: Not on file  Physical Activity: Not on file  Stress: Not on file  Social Connections: Not on file  Intimate Partner Violence: Not on file     BP 98/64   Pulse 65   Ht _0  (1.753 m)   Wt 199 lb (90.3 kg)   LMP  (LMP Unknown)   SpO2 96%   BMI 29.39 kg/m   Physical Exam:  Well appearing middle aged woman, NAD HEENT: Unremarkable Neck:  No JVD, no thyromegally Lymphatics:  No adenopathy Back:  No CVA tenderness Lungs:  Clear with no wheezes, port o cath incision on right chest. HEART:  Regular rate rhythm, no murmurs, no rubs, no clicks Abd:  soft, positive bowel sounds, no organomegally, no rebound, no guarding Ext:  2 plus pulses, no edema, no cyanosis, no clubbing Skin:  No rashes no nodules Neuro:  CN II through XII intact, motor grossly intact  EKG - nsr with LBBB, QRS 150 ms   Assess/Plan: Chronic systolic heart failure - we have discussed the pro's and con's and risks/benefits/ of biv ICD insertion. She would like to proceed. She has been on maximal medical therapy with Entresto and coreg and lasix LBBB - with above, I would recommend insertion of a  biv ICD.  Rebecca Overlie Jannah Guardiola,MD

## 2022-04-03 ENCOUNTER — Encounter: Payer: Self-pay | Admitting: Hematology and Oncology

## 2022-04-03 ENCOUNTER — Telehealth: Payer: Self-pay

## 2022-04-03 LAB — CBC WITH DIFFERENTIAL/PLATELET
Basophils Absolute: 0 10*3/uL (ref 0.0–0.2)
Basos: 0 %
EOS (ABSOLUTE): 0 10*3/uL (ref 0.0–0.4)
Eos: 0 %
Hematocrit: 41.5 % (ref 34.0–46.6)
Hemoglobin: 13.4 g/dL (ref 11.1–15.9)
Immature Grans (Abs): 0 10*3/uL (ref 0.0–0.1)
Immature Granulocytes: 0 %
Lymphocytes Absolute: 1.8 10*3/uL (ref 0.7–3.1)
Lymphs: 36 %
MCH: 26.6 pg (ref 26.6–33.0)
MCHC: 32.3 g/dL (ref 31.5–35.7)
MCV: 83 fL (ref 79–97)
Monocytes Absolute: 0.5 10*3/uL (ref 0.1–0.9)
Monocytes: 9 %
Neutrophils Absolute: 2.8 10*3/uL (ref 1.4–7.0)
Neutrophils: 55 %
Platelets: 209 10*3/uL (ref 150–450)
RBC: 5.03 x10E6/uL (ref 3.77–5.28)
RDW: 13.6 % (ref 11.7–15.4)
WBC: 5.1 10*3/uL (ref 3.4–10.8)

## 2022-04-03 LAB — BASIC METABOLIC PANEL
BUN/Creatinine Ratio: 23 (ref 9–23)
BUN: 19 mg/dL (ref 6–24)
CO2: 24 mmol/L (ref 20–29)
Calcium: 9.8 mg/dL (ref 8.7–10.2)
Chloride: 103 mmol/L (ref 96–106)
Creatinine, Ser: 0.81 mg/dL (ref 0.57–1.00)
Glucose: 99 mg/dL (ref 70–99)
Potassium: 4.7 mmol/L (ref 3.5–5.2)
Sodium: 140 mmol/L (ref 134–144)
eGFR: 87 mL/min/{1.73_m2} (ref >59)

## 2022-04-03 NOTE — Telephone Encounter (Signed)
Pt called per Dr. Lovena Le request.    Pt has been scheduled for MDT BiV ICD placement with Dr. Lovena Le at 730 am, 04/05/2022.    Pt was called and told to check in at [facility] entrance A / Admitting at 530 am.  Pt told Npo after midnight, no medications the morning of procedure.  Pt told to use surgical scrub the night before and morning of the procedure.    Pt told she will receive a pre-procedure letter from [place] with all the details regarding the BiV ICD implant with Dr. Lovena Le.    Pt verbalized understanding of all orders.  Pt stated she will check in at 530 am 04/05/2022.

## 2022-04-04 ENCOUNTER — Telehealth: Payer: Self-pay

## 2022-04-04 ENCOUNTER — Telehealth: Payer: Self-pay | Admitting: Internal Medicine

## 2022-04-04 NOTE — Telephone Encounter (Signed)
Patient called stating she was returning a call regarding providing an updated list of her medications prior to her procedure tomorrow.

## 2022-04-04 NOTE — Telephone Encounter (Signed)
Pt called back to answer question received in MyChart.    Pt wanted to know device, and manufacturer, but stated she will confirm this with Cath lab team.    Pt made aware of clinic time this morning, and I apologized for the late afternoon reply to her question.  Pt will have questions for the pre-procedure team.  Follow up required.

## 2022-04-04 NOTE — Pre-Procedure Instructions (Signed)
Instructed patient on the following items: Arrival time 0515 Nothing to eat or drink after midnight No meds AM of procedure Responsible person to drive you home and stay with you for 24 hrs Wash with special soap night before and morning of procedure  

## 2022-04-05 ENCOUNTER — Ambulatory Visit (HOSPITAL_COMMUNITY)
Admission: RE | Admit: 2022-04-05 | Discharge: 2022-04-05 | Disposition: A | Discharge status: Home / Self Care | Payer: No Typology Code available for payment source | Source: Ambulatory Visit | Attending: Internal Medicine | Admitting: Internal Medicine

## 2022-04-05 ENCOUNTER — Telehealth: Payer: Self-pay

## 2022-04-05 ENCOUNTER — Ambulatory Visit (HOSPITAL_COMMUNITY): Payer: No Typology Code available for payment source

## 2022-04-05 ENCOUNTER — Encounter (HOSPITAL_COMMUNITY)
Admission: RE | Disposition: A | Discharge status: Home / Self Care | Payer: Self-pay | Source: Ambulatory Visit | Attending: Internal Medicine

## 2022-04-05 ENCOUNTER — Other Ambulatory Visit: Payer: Self-pay

## 2022-04-05 DIAGNOSIS — I428 Other cardiomyopathies: Secondary | ICD-10-CM | POA: Diagnosis present

## 2022-04-05 DIAGNOSIS — I5022 Chronic systolic (congestive) heart failure: Secondary | ICD-10-CM | POA: Insufficient documentation

## 2022-04-05 DIAGNOSIS — Z79899 Other long term (current) drug therapy: Secondary | ICD-10-CM | POA: Diagnosis not present

## 2022-04-05 DIAGNOSIS — Z6829 Body mass index (BMI) 29.0-29.9, adult: Secondary | ICD-10-CM | POA: Insufficient documentation

## 2022-04-05 DIAGNOSIS — E669 Obesity, unspecified: Secondary | ICD-10-CM | POA: Insufficient documentation

## 2022-04-05 DIAGNOSIS — Z87891 Personal history of nicotine dependence: Secondary | ICD-10-CM | POA: Diagnosis not present

## 2022-04-05 DIAGNOSIS — Z95810 Presence of automatic (implantable) cardiac defibrillator: Secondary | ICD-10-CM | POA: Insufficient documentation

## 2022-04-05 DIAGNOSIS — Z853 Personal history of malignant neoplasm of breast: Secondary | ICD-10-CM | POA: Diagnosis not present

## 2022-04-05 DIAGNOSIS — I429 Cardiomyopathy, unspecified: Secondary | ICD-10-CM | POA: Diagnosis not present

## 2022-04-05 DIAGNOSIS — I110 Hypertensive heart disease with heart failure: Secondary | ICD-10-CM | POA: Insufficient documentation

## 2022-04-05 DIAGNOSIS — I447 Left bundle-branch block, unspecified: Secondary | ICD-10-CM | POA: Diagnosis not present

## 2022-04-05 HISTORY — DX: Presence of automatic (implantable) cardiac defibrillator: Z95.810

## 2022-04-05 HISTORY — PX: BIV ICD INSERTION CRT-D: EP1195

## 2022-04-05 SURGERY — BIV ICD INSERTION CRT-D

## 2022-04-05 MED ORDER — HEPARIN (PORCINE) IN NACL 1000-0.9 UT/500ML-% IV SOLN
INTRAVENOUS | Status: DC | PRN
  Administered 2022-04-05: 500 mL

## 2022-04-05 MED ORDER — FENTANYL CITRATE (PF) 100 MCG/2ML IJ SOLN
INTRAMUSCULAR | Status: DC | PRN
  Administered 2022-04-05 (×2): 25 ug via INTRAVENOUS
  Administered 2022-04-05: 12.5 ug via INTRAVENOUS
  Administered 2022-04-05: 25 ug via INTRAVENOUS
  Administered 2022-04-05: 12.5 ug via INTRAVENOUS

## 2022-04-05 MED ORDER — ONDANSETRON HCL 4 MG/2ML IJ SOLN: 4.0000 mg | Freq: Four times a day (QID) | INTRAMUSCULAR | Status: DC | PRN

## 2022-04-05 MED ORDER — SODIUM CHLORIDE 0.9 % IV SOLN
INTRAVENOUS | Status: AC
  Filled 2022-04-05: qty 2

## 2022-04-05 MED ORDER — CEFAZOLIN SODIUM-DEXTROSE 2-4 GM/100ML-% IV SOLN
INTRAVENOUS | Status: AC
  Filled 2022-04-05: qty 100

## 2022-04-05 MED ORDER — FENTANYL CITRATE (PF) 100 MCG/2ML IJ SOLN
INTRAMUSCULAR | Status: AC
  Filled 2022-04-05: qty 2

## 2022-04-05 MED ORDER — MIDAZOLAM HCL 5 MG/5ML IJ SOLN
INTRAMUSCULAR | Status: DC | PRN
  Administered 2022-04-05 (×2): 2 mg via INTRAVENOUS
  Administered 2022-04-05 (×7): 1 mg via INTRAVENOUS
  Administered 2022-04-05: 2 mg via INTRAVENOUS
  Administered 2022-04-05 (×2): 1 mg via INTRAVENOUS
  Administered 2022-04-05: 2 mg via INTRAVENOUS
  Administered 2022-04-05: 1 mg via INTRAVENOUS

## 2022-04-05 MED ORDER — MIDAZOLAM HCL 5 MG/5ML IJ SOLN
INTRAMUSCULAR | Status: AC
  Filled 2022-04-05: qty 5

## 2022-04-05 MED ORDER — LIDOCAINE HCL (PF) 1 % IJ SOLN
INTRAMUSCULAR | Status: DC | PRN
  Administered 2022-04-05: 90 mL

## 2022-04-05 MED ORDER — CEFAZOLIN SODIUM-DEXTROSE 2-4 GM/100ML-% IV SOLN: 2.0000 g | INTRAVENOUS | Status: DC

## 2022-04-05 MED ORDER — CEFAZOLIN SODIUM-DEXTROSE 1-4 GM/50ML-% IV SOLN: 1.0000 g | Freq: Once | INTRAVENOUS | Status: DC

## 2022-04-05 MED ORDER — POVIDONE-IODINE 10 % EX SWAB
2.0000 | Freq: Once | CUTANEOUS | Status: AC
  Administered 2022-04-05: 2 via TOPICAL

## 2022-04-05 MED ORDER — CEFAZOLIN SODIUM-DEXTROSE 2-3 GM-%(50ML) IV SOLR
INTRAVENOUS | Status: DC | PRN
  Administered 2022-04-05: 2 g via INTRAVENOUS

## 2022-04-05 MED ORDER — IODIXANOL 320 MG/ML IV SOLN
INTRAVENOUS | Status: DC | PRN
  Administered 2022-04-05: 25 mL

## 2022-04-05 MED ORDER — LIDOCAINE HCL (PF) 1 % IJ SOLN
INTRAMUSCULAR | Status: AC
  Filled 2022-04-05: qty 30

## 2022-04-05 MED ORDER — LIDOCAINE HCL (PF) 1 % IJ SOLN
INTRAMUSCULAR | Status: AC
  Filled 2022-04-05: qty 60

## 2022-04-05 MED ORDER — CHLORHEXIDINE GLUCONATE 4 % EX LIQD: 4.0000 | Freq: Once | CUTANEOUS | Status: DC

## 2022-04-05 MED ORDER — HEPARIN (PORCINE) IN NACL 1000-0.9 UT/500ML-% IV SOLN
INTRAVENOUS | Status: AC
  Filled 2022-04-05: qty 500

## 2022-04-05 MED ORDER — ACETAMINOPHEN 325 MG PO TABS: 325.0000 mg | ORAL_TABLET | ORAL | Status: DC | PRN

## 2022-04-05 MED ORDER — SODIUM CHLORIDE 0.9 % IV SOLN
80.0000 mg | INTRAVENOUS | Status: AC
  Administered 2022-04-05: 80 mg

## 2022-04-05 MED ORDER — SODIUM CHLORIDE 0.9 % IV SOLN: INTRAVENOUS | Status: DC

## 2022-04-05 SURGICAL SUPPLY — 27 items
BALLN ATTAIN 80 (BALLOONS) ×1
BALLOON ATTAIN 80 (BALLOONS) IMPLANT
CABLE SURGICAL S-101-97-12 (CABLE) ×1 IMPLANT
CATH ATTAIN COM SURV 6250V-MB2 (CATHETERS) IMPLANT
CATH ATTAIN COMMAND 6250-MB2 (CATHETERS) IMPLANT
CATH ATTAIN SEL SURV 6248V-130 (CATHETERS) IMPLANT
CATH ATTAIN SEL SURV 6248V-90 (CATHETERS) IMPLANT
CATH CPS LOCATOR 3D LG (CATHETERS) IMPLANT
CATH JOSEPH QUAD ALLRED 6F REP (CATHETERS) IMPLANT
CATH RIGHTSITE C315HIS02 (CATHETERS) IMPLANT
GUIDEWIRE ANGLED .035X150CM (WIRE) IMPLANT
HELIX LOCKING TOOL (MISCELLANEOUS) ×1
ICD COBALT XT CRT DTPA2D1 (ICD Generator) IMPLANT
KIT ESSENTIALS PG (KITS) IMPLANT
KIT MICROPUNCTURE NIT STIFF (SHEATH) IMPLANT
LEAD CAPSURE NOVUS 5076-52CM (Lead) IMPLANT
LEAD SELECT SECURE 3830 383069 (Lead) IMPLANT
LEAD SPRINT QUAT SEC 6935-65CM (Lead) IMPLANT
LEAD ULTIPACE 65 LPA1231/65 (Lead) IMPLANT
PAD DEFIB RADIO PHYSIO CONN (PAD) ×1 IMPLANT
SELECT SECURE 3830 383069 (Lead) ×2 IMPLANT
SHEATH 9FR PRELUDE SNAP 13 (SHEATH) IMPLANT
SLITTER 6232ADJ (MISCELLANEOUS) IMPLANT
SLITTER AGILIS HISPRO (INSTRUMENTS) IMPLANT
TOOL HELIX LOCKING (MISCELLANEOUS) IMPLANT
TRAY PACEMAKER INSERTION (PACKS) ×1 IMPLANT
WIRE ACUITY WHISPER EDS 4648 (WIRE) IMPLANT

## 2022-04-05 NOTE — Telephone Encounter (Signed)
-----   Message from Evans Lance, MD sent at 04/03/2022  6:37 PM EST ----- Normal labs.

## 2022-04-05 NOTE — Telephone Encounter (Signed)
MyChart message sent to Pt normal pre-procedure lab results

## 2022-04-05 NOTE — Interval H&P Note (Signed)
History and Physical Interval Note:  04/05/2022 7:48 AM  [name]  has presented today for surgery, with the diagnosis of eri.  The various methods of treatment have been discussed with the patient and family. After consideration of risks, benefits and other options for treatment, the patient has consented to  Procedure(s): BIV ICD INSERTION CRT-D (N/A) as a surgical intervention.  The patient's history has been reviewed, patient examined, no change in status, stable for surgery.  I have reviewed the patient's chart and labs.  Questions were answered to the patient's satisfaction.     Rebecca [name]

## 2022-04-05 NOTE — Progress Notes (Signed)
Patient was given discharge instructions. She verbalized understanding. 

## 2022-04-05 NOTE — Discharge Instructions (Addendum)
     Supplemental Discharge Instructions for  Pacemaker/Defibrillator Patients  Tomorrow, 04/06/22, send in a device transmission  Activity No heavy lifting or vigorous activity with your left/right arm for 6 to 8 weeks.  Do not raise your left/right arm above your head for one week.  Gradually raise your affected arm as drawn below.             04/10/22                    04/11/22                    04/12/22                  04/13/22 __  NO DRIVING for  1 week   ; you may begin driving on   10/22/92  .  WOUND CARE Keep the wound area clean and dry.  Do not get this area wet , no showers for one week; you may shower on  04/13/22   . Tomorrow, 04/07/11, remove the arm sling Tomorrow, 04/07/11 remove the outer plastic bandage.  Underneath the plastic bandage there are steri strips (paper tapes), DO NOT remove these. The tape/steri-strips on your wound will fall off; do not pull them off.  No bandage is needed on the site.  DO  NOT apply any creams, oils, or ointments to the wound area. If you notice any drainage or discharge from the wound, any swelling or bruising at the site, or you develop a fever > 101? F after you are discharged home, call the office at once. [phone]  Special Instructions You are still able to use cellular telephones; use the ear opposite the side where you have your pacemaker/defibrillator.  Avoid carrying your cellular phone near your device. When traveling through airports, show security personnel your identification card to avoid being screened in the metal detectors.  Ask the security personnel to use the hand wand. Avoid arc welding equipment, MRI testing (magnetic resonance imaging), TENS units (transcutaneous nerve stimulators).  Call the office for questions about other devices. Avoid electrical appliances that are in poor condition or are not properly grounded. Microwave ovens are safe to be near or to operate.  Additional information for defibrillator  patients should your device go off: If your device goes off ONCE and you feel fine afterward, notify the device clinic nurses. If your device goes off ONCE and you do not feel well afterward, call 911. If your device goes off TWICE, call 911. If your device goes off THREE times in one day, call 911.  DO NOT DRIVE YOURSELF OR A FAMILY MEMBER WITH A DEFIBRILLATOR TO THE HOSPITAL--CALL 911.

## 2022-04-06 ENCOUNTER — Encounter: Payer: Self-pay | Admitting: Cardiology

## 2022-04-06 DIAGNOSIS — Z4502 Encounter for adjustment and management of automatic implantable cardiac defibrillator: Secondary | ICD-10-CM

## 2022-04-06 HISTORY — DX: Encounter for adjustment and management of automatic implantable cardiac defibrillator: Z45.02

## 2022-04-08 ENCOUNTER — Encounter (HOSPITAL_COMMUNITY): Payer: Self-pay | Admitting: Internal Medicine

## 2022-04-09 ENCOUNTER — Telehealth: Payer: Self-pay

## 2022-04-09 ENCOUNTER — Other Ambulatory Visit: Payer: Self-pay

## 2022-04-09 NOTE — Telephone Encounter (Signed)
-----   Message from [place], [place] sent at 04/05/2022 11:36 AM EST ----- Same day d/c MDT Bive ICD  GT No a/c

## 2022-04-10 ENCOUNTER — Other Ambulatory Visit: Payer: Self-pay

## 2022-04-16 ENCOUNTER — Ambulatory Visit: Payer: No Typology Code available for payment source | Attending: Internal Medicine

## 2022-04-16 DIAGNOSIS — I5022 Chronic systolic (congestive) heart failure: Secondary | ICD-10-CM | POA: Diagnosis not present

## 2022-04-16 DIAGNOSIS — I429 Cardiomyopathy, unspecified: Secondary | ICD-10-CM

## 2022-04-16 DIAGNOSIS — Z95810 Presence of automatic (implantable) cardiac defibrillator: Secondary | ICD-10-CM

## 2022-04-16 LAB — CUP PACEART INCLINIC DEVICE CHECK
Brady Statistic RA Percent Paced: 0.2 %
Date Time Interrogation Session: 20231219122752
HighPow Impedance: 53 Ohm
Implantable Lead Connection Status: 753985
Implantable Lead Connection Status: 753985
Implantable Lead Connection Status: 753985
Implantable Lead Implant Date: 20231208
Implantable Lead Implant Date: 20231208
Implantable Lead Implant Date: 20231208
Implantable Lead Location: 753858
Implantable Lead Location: 753859
Implantable Lead Location: 753860
Implantable Lead Model: 5076
Implantable Lead Model: 6935
Implantable Pulse Generator Implant Date: 20231208
Lead Channel Impedance Value: 323 Ohm
Lead Channel Impedance Value: 361 Ohm
Lead Channel Impedance Value: 532 Ohm
Lead Channel Pacing Threshold Amplitude: 0.5 V
Lead Channel Pacing Threshold Amplitude: 0.75 V
Lead Channel Pacing Threshold Amplitude: 1 V
Lead Channel Pacing Threshold Pulse Width: 0.4 ms
Lead Channel Pacing Threshold Pulse Width: 0.4 ms
Lead Channel Pacing Threshold Pulse Width: 0.4 ms
Lead Channel Sensing Intrinsic Amplitude: 11.3 mV
Lead Channel Sensing Intrinsic Amplitude: 3.1 mV

## 2022-04-16 NOTE — Progress Notes (Signed)
CRT-D/Wound check in office.  Wound check appointment. Steri-strips removed. Wound without redness or edema. Incision edges approximated, wound well healed. Normal device function. Thresholds, sensing, and impedances consistent with implant measurements. Device programmed at 3.5V for extra safety margin until 3 month visit. Histogram distribution appropriate for patient and level of activity. No mode switches or ventricular arrhythmias noted.   Patient educated about wound care, arm mobility, lifting restrictions, shock plan. ROV in 3 months with implanting physician. . Patient bi-ventricularly pacing 99.8% of the time. Device programmed with appropriate safety margins. Heart failure diagnostics reviewed and trends are stable for patient. Audible/vibratory alerts demonstrated for patient. No changes made this session. Patient education completed including shock plan.

## 2022-04-16 NOTE — Patient Instructions (Signed)

## 2022-04-17 ENCOUNTER — Other Ambulatory Visit (HOSPITAL_COMMUNITY): Payer: Self-pay | Admitting: Neurological Surgery

## 2022-04-17 DIAGNOSIS — M5416 Radiculopathy, lumbar region: Secondary | ICD-10-CM

## 2022-04-18 ENCOUNTER — Encounter: Payer: Self-pay | Admitting: Hematology and Oncology

## 2022-04-18 ENCOUNTER — Encounter: Payer: Self-pay | Admitting: Internal Medicine

## 2022-04-18 ENCOUNTER — Ambulatory Visit: Payer: No Typology Code available for payment source

## 2022-04-18 ENCOUNTER — Ambulatory Visit: Payer: 59 | Admitting: Internal Medicine

## 2022-04-18 ENCOUNTER — Telehealth: Payer: Self-pay | Admitting: Internal Medicine

## 2022-04-18 VITALS — BP 102/64 | HR 92 | Temp 98.7°F | Ht 70.0 in | Wt 190.0 lb

## 2022-04-18 DIAGNOSIS — E1169 Type 2 diabetes mellitus with other specified complication: Secondary | ICD-10-CM | POA: Diagnosis not present

## 2022-04-18 DIAGNOSIS — E669 Obesity, unspecified: Secondary | ICD-10-CM

## 2022-04-18 MED ORDER — OXYCODONE-ACETAMINOPHEN 10-325 MG PO TABS: 1.0000 | ORAL_TABLET | ORAL | 0 refills | Status: DC | PRN

## 2022-04-18 MED ORDER — VENLAFAXINE HCL ER 75 MG PO CP24: 75.0000 mg | ORAL_CAPSULE | Freq: Every day | ORAL | 0 refills | Status: DC

## 2022-04-18 MED ORDER — DIAZEPAM 10 MG PO TABS: 10.0000 mg | ORAL_TABLET | Freq: Two times a day (BID) | ORAL | 1 refills | Status: DC | PRN

## 2022-04-18 NOTE — Progress Notes (Signed)
   Subjective:    Patient ID: Rebecca [name], female    DOB: 02/01/1971, 51 y.o.   MRN: [id number]  HPI 51 year old Female with history of breast cancer,chronic systolic heart failure with ICD inserted by Dr. Lovena Le in early December, and Diabetes mellitus seen for anxiety and situational stress, her job with [employer] ended recently and was a bit of a surprise for her. She had planned to work a bit longer. Husband operates an Designer, industrial/product. She has a son and a daughter.She also recently had lumbar disc surgery by Dr. Ellene Route.In September, she had a laceration of occipital scalp as a result of a fall seen at [facility].  Had colonoscopy in November.  Review of Systems Had left lumbar radiculopathy with herniated disc L5-S1 on MRI July 2023. Had left radiculopathy/pain due to herniated disc and underwent microdiscectomy at L5-S1 level to decompress the S1 nerve root.  Recently has been having more back pain and is upset with that as well.  Has considered applying for disability benefits given her history of breast cancer, heart condition and nail back issue.  I would support her in that effort.  Also some situational stress within walls at times.  She would benefit from counseling.  She is considering that.  She has been prescribed Ativan for anxiety 1 mg twice daily.     Objective:   Physical Exam  She is having considerable low back pain.  Her muscle strength is normal in the lower extremities.  She is moving slowly.  She is anxious and tearful.      Assessment & Plan:    Musculoskeletal pain lumbar spine status post microdiscectomy L5-S1 on the left to decompress the S1 nerve root.  She is on Mobic  Anxiety state treated with lorazepam twice daily  History of breast cancer  Situational stress discussed including recent job loss  History of orthostatic hypotension with syncope September 9169  Chronic systolic heart failure followed by Dr. Nadyne Coombes.  She is  on Entresto.  She is also on Coreg  Essential hypertension-under good control with current regimen  Type 2 diabetes mellitus-stable.  She is on Mounjaro  GE reflux treated with Nexium  Hypothyroidism treated with levothyroxine 150 mcg daily  Depression treated with Effexor  Plan: Dr. Ellene Route plans to contact patient regarding her persistent back issue.  We were able to get in touch with his office staff today and they will relay message to him.  In the meantime, she needs some pain relief acutely and I have prescribed oxycodone APAP 10/325  # 30 tablets to take sparingly every 4 hours for pain.  Encourage patient to apply for disability benefits.  Encourage patient to consider counseling.  May take Valium 10 mg by mouth every 12 hours for musculoskeletal pain and anxiety.  This may help relax her back.  Time spent with patient today with evaluation of back pain and situational stress is 40 minutes including time counseling her regarding options going forward

## 2022-04-18 NOTE — Telephone Encounter (Signed)
Rebecca [name] [phone]  [name] called and wanted to come in and be seen, she has stopped taking her anxiety medication and she thinks she needs to go back on it, she has recently lost her job. She was really trying not to cry while talking with me. I scheduled her for 2:30 today.

## 2022-04-19 LAB — MICROALBUMIN / CREATININE URINE RATIO
Creatinine, Urine: 229 mg/dL (ref 20–275)
Microalb Creat Ratio: 7 mcg/mg creat (ref <30)
Microalb, Ur: 1.5 mg/dL

## 2022-04-20 ENCOUNTER — Other Ambulatory Visit: Payer: Self-pay | Admitting: Internal Medicine

## 2022-05-07 ENCOUNTER — Encounter: Payer: Self-pay | Admitting: Hematology and Oncology

## 2022-05-07 ENCOUNTER — Other Ambulatory Visit: Payer: Self-pay | Admitting: Internal Medicine

## 2022-05-07 ENCOUNTER — Other Ambulatory Visit: Payer: Self-pay | Admitting: Cardiology

## 2022-05-07 DIAGNOSIS — I5022 Chronic systolic (congestive) heart failure: Secondary | ICD-10-CM

## 2022-05-08 ENCOUNTER — Other Ambulatory Visit: Payer: Self-pay | Admitting: Cardiology

## 2022-05-08 DIAGNOSIS — I5022 Chronic systolic (congestive) heart failure: Secondary | ICD-10-CM

## 2022-05-14 ENCOUNTER — Encounter: Payer: Self-pay | Admitting: Cardiology

## 2022-05-14 ENCOUNTER — Other Ambulatory Visit: Payer: Self-pay

## 2022-05-14 MED ORDER — ENTRESTO 97-103 MG PO TABS: 1.0000 | ORAL_TABLET | Freq: Two times a day (BID) | ORAL | 3 refills | Status: DC

## 2022-05-15 ENCOUNTER — Other Ambulatory Visit: Payer: Self-pay

## 2022-05-15 DIAGNOSIS — E1169 Type 2 diabetes mellitus with other specified complication: Secondary | ICD-10-CM

## 2022-05-15 MED ORDER — TIRZEPATIDE 15 MG/0.5ML ~~LOC~~ SOAJ: 15.0000 mg | SUBCUTANEOUS | 6 refills | Status: DC

## 2022-05-16 ENCOUNTER — Other Ambulatory Visit: Payer: Self-pay | Admitting: Internal Medicine

## 2022-05-20 ENCOUNTER — Encounter: Payer: Self-pay | Admitting: Internal Medicine

## 2022-05-20 DIAGNOSIS — Z0289 Encounter for other administrative examinations: Secondary | ICD-10-CM

## 2022-05-21 ENCOUNTER — Encounter: Payer: Self-pay | Admitting: Hematology and Oncology

## 2022-05-22 ENCOUNTER — Encounter: Payer: Self-pay | Admitting: Hematology and Oncology

## 2022-05-25 NOTE — Patient Instructions (Addendum)
Oxycodone APAP 10/325 (30 tablets) prescribed to take sparingly every 4 hours for back pain.  Dr. Clarice Pole office will be in contact with her regarding her persistent back issues/pain.  She has lorazepam for anxiety but I prefer her to try Valium 10 mg every 12 hours which may help back pain in addition to anxiety.  Continue to monitor blood sugars closely.  Encourage patient to apply for disability benefits.

## 2022-05-28 ENCOUNTER — Ambulatory Visit (HOSPITAL_COMMUNITY): Admission: RE | Admit: 2022-05-28 | Payer: 59 | Source: Ambulatory Visit

## 2022-05-28 ENCOUNTER — Encounter (HOSPITAL_COMMUNITY): Payer: Self-pay

## 2022-05-28 ENCOUNTER — Encounter: Payer: Self-pay | Admitting: Internal Medicine

## 2022-05-28 NOTE — Telephone Encounter (Signed)
Rebecca [name] called today to say she had gone to get her MRI at [facility] and they said they could not do it because of the 4th lead in her defibrillator and she would have to go to [facility] for that. She said she would call back if she needed to come in for an office visit for the pain medication. She was really confused as to why she had not been told ahead of time about not being able to get MRI, she understood by the surgeon this was not going to be a problem, She has sent message to them.

## 2022-05-29 ENCOUNTER — Other Ambulatory Visit: Payer: Self-pay | Admitting: Neurological Surgery

## 2022-05-29 ENCOUNTER — Other Ambulatory Visit (HOSPITAL_COMMUNITY): Payer: Self-pay | Admitting: Neurological Surgery

## 2022-05-29 DIAGNOSIS — M5416 Radiculopathy, lumbar region: Secondary | ICD-10-CM

## 2022-05-29 NOTE — Progress Notes (Signed)
Primary Physician/Referring:  Elby Showers, MD  Patient ID: Rebecca [name], female    DOB: 08/11/1970, 52 y.o.   MRN: [id number]  Appointment date changed  HPI:    Rebecca [name]  is a 52 y.o. Caucasian female initially with history of LBBB at least dating back to 2013.   Past medical history includes hyperthyroidism S/P RAI therapy in March 2018, depression, DM, estrogen negative right breast cancer S/P Bilateral mastectomy and adjuvant chemotherapy(Adriamycin and Cytoxan x4)  and also breast reconstruction surgery on 11/18/2017.   She has since developed severe LV systolic dysfunction and due to persistent low EF and dyspnea underwent BV ICD implantation with Medtronic CRT-D on 04/05/2022.  This is a 3 month OV.  Past Medical History:  Diagnosis Date   Acute respiratory disease due to COVID-19 virus 12/25/2019   Allergy    constant hives post chemo therapy.  All allergen tests have been neg.   Anxiety    Breast cancer ([place])    Coronary artery disease    CRTD Medtronic COBALT XT CRT D 04/05/2022 04/05/2022   Diabetes mellitus without complication ([place])    Diverticulitis    Encounter for assessment of implantable cardioverter-defibrillator (ICD) 04/06/2022   GERD (gastroesophageal reflux disease)    Heart failure ([place]) 01/02/2022   EF 25 to 30%   Hiatal hernia    Hyperlipidemia    Hypertension    Hypothyroidism    LBBB (left bundle branch block)    Pneumonia     Social History   Tobacco Use   Smoking status: Former    Packs/day: 0.25    Years: 6.00    Total pack years: 1.50    Types: Cigarettes    Quit date: 03/29/2005    Years since quitting: 17.1   Smokeless tobacco: Never  Substance Use Topics   Alcohol use: Yes    Comment: socially  Marital Status: Married    ROS  Review of Systems  Constitutional: Positive for weight loss (intentional).  Cardiovascular:  Negative for chest pain, dyspnea on exertion and leg swelling.   Objective       04/18/2022    2:29 PM 04/05/2022    2:00 PM 04/05/2022    1:00 PM  Vitals with BMI  Height '5\' 10"'$     Weight 190 lbs    BMI 0000000    Systolic [id number] 91 92  Diastolic 64 56 67  Pulse 92 97 99    There were no vitals taken for this visit. There is no height or weight on file to calculate BMI.   No data found.   Physical Exam Neck:     Vascular: No JVD.  Cardiovascular:     Rate and Rhythm: Normal rate and regular rhythm.     Pulses: Intact distal pulses.     Heart sounds: Normal heart sounds. No murmur heard.    No gallop.  Pulmonary:     Effort: Pulmonary effort is normal.     Breath sounds: Normal breath sounds.  Abdominal:     General: Bowel sounds are normal.     Palpations: Abdomen is soft.  Musculoskeletal:     Right lower leg: No edema.     Left lower leg: No edema.    Radiology: Laboratory examination:   Recent Labs    09/04/21 1457 02/04/22 0000 03/04/22 1506 04/02/22 1008  NA 141 142 142 140  K 5.0 4.5 4.6 4.7  CL 104 101 105 103  CO2 23  $'22 31 24  'x$ GLUCOSE 91  --  88 99  BUN '23 17 12 19  '$ CREATININE 0.96 1.1 1.07* 0.81  CALCIUM 9.4 9.7 9.7 9.8  GFRNONAA  --   --  >60  --        Latest Ref Rng & Units 04/02/2022   10:08 AM 03/04/2022    3:06 PM 02/04/2022   12:00 AM  CMP  Glucose 70 - 99 mg/dL 99  88    BUN 6 - 24 mg/dL '19  12  17      '$ Creatinine 0.57 - 1.00 mg/dL 0.81  1.07  1.1      Sodium 134 - 144 mmol/L 140  142  142      Potassium 3.5 - 5.2 mmol/L 4.7  4.6  4.5      Chloride 96 - 106 mmol/L 103  105  101      CO2 20 - 29 mmol/L '24  31  22      '$ Calcium 8.7 - 10.2 mg/dL 9.8  9.7  9.7      Total Protein 6.5 - 8.1 g/dL  6.9    Total Bilirubin 0.3 - 1.2 mg/dL  0.5    Alkaline Phos 38 - 126 U/L  58  59      AST 15 - 41 U/L  14  17      ALT 0 - 44 U/L  13  18         This result is from an external source.       Latest Ref Rng & Units 04/02/2022   10:08 AM 03/04/2022    3:06 PM 02/04/2022   12:00 AM  CBC  WBC 3.4 - 10.8 x10E3/uL 5.1  5.8   6.1      Hemoglobin 11.1 - 15.9 g/dL 13.4  13.4  13.9      Hematocrit 34.0 - 46.6 % 41.5  40.5  44      Platelets 150 - 450 x10E3/uL 209  226  220         This result is from an external source.    Lipid Panel Recent Labs    08/23/21 0805 02/04/22 0000  CHOL 173 222*  TRIG 181* 170*  LDLCALC 93 142  HDL 49 49      HEMOGLOBIN A1C Lab Results  Component Value Date   HGBA1C 5.8 02/04/2022   MPG 126 12/18/2020   TSH Recent Labs    08/23/21 0805 09/04/21 1457 02/04/22 0000  TSH 4.850* 3.63 0.85    Medications   Current Outpatient Medications:    Aspirin-Caffeine (BAYER BACK & BODY) 500-32.5 MG TABS, Take 2 tablets by mouth daily as needed (Pain)., Disp: , Rfl:    carvedilol (COREG) 12.5 MG tablet, TAKE 1 TABLET BY MOUTH TWICE A DAY, Disp: 180 tablet, Rfl: 3   diazepam (VALIUM) 10 MG tablet, Take 1 tablet (10 mg total) by mouth every 12 (twelve) hours as needed for anxiety., Disp: 30 tablet, Rfl: 1   ENTRESTO 97-103 MG, Take 1 tablet by mouth 2 (two) times daily., Disp: 180 tablet, Rfl: 3   EPINEPHrine 0.3 mg/0.3 mL IJ SOAJ injection, Use as directed for life threatening allergic reactions only (Patient taking differently: Inject 0.3 mg into the muscle as needed for anaphylaxis. Use as directed for life threatening allergic reactions only), Disp: 2 each, Rfl: 3   esomeprazole (NEXIUM) 40 MG capsule, TAKE 1 CAPSULE (40 MG TOTAL) BY MOUTH DAILY AT 12 NOON., Disp:  90 capsule, Rfl: 3   furosemide (LASIX) 40 MG tablet, TAKE 1 TABLET BY MOUTH EVERY DAY AS NEEDED FOR FLUID *SHORTNESS OF BREATH*, Disp: 30 tablet, Rfl: 1   ibuprofen (MOTRIN IB) 200 MG tablet, Take 400 mg by mouth every 6 (six) hours as needed for mild pain or moderate pain., Disp: , Rfl:    levothyroxine (SYNTHROID) 150 MCG tablet, TAKE 1 TABLET BY MOUTH EVERY DAY, Disp: 90 tablet, Rfl: 2   LORazepam (ATIVAN) 1 MG tablet, TAKE 1 TABLET BY MOUTH TWICE A DAY AS NEEDED (Patient taking differently: Take 1 mg by mouth  daily as needed for sleep or anxiety.), Disp: 60 tablet, Rfl: 5   meloxicam (MOBIC) 15 MG tablet, Take 15 mg by mouth daily., Disp: , Rfl:    montelukast (SINGULAIR) 10 MG tablet, TAKE ONE TABLET BY MOUTH DAILY, Disp: 90 tablet, Rfl: 3   oxyCODONE-acetaminophen (PERCOCET) 10-325 MG tablet, Take 1 tablet by mouth every 4 (four) hours as needed for pain., Disp: 30 tablet, Rfl: 0   pregabalin (LYRICA) 75 MG capsule, Take 75 mg by mouth 2 (two) times daily., Disp: , Rfl:    rosuvastatin (CRESTOR) 20 MG tablet, Take 1 tablet (20 mg total) by mouth daily., Disp: 90 tablet, Rfl: 3   testosterone cypionate (DEPO-TESTOSTERONE) 200 MG/ML injection, Inject 200 mg into the muscle every 3 (three) months., Disp: , Rfl:    tirzepatide (MOUNJARO) 15 MG/0.5ML Pen, Inject 15 mg into the skin once a week., Disp: 2 mL, Rfl: 6   VAGIFEM 10 MCG TABS vaginal tablet, Place 1 tablet vaginally 2 (two) times a week., Disp: , Rfl:    venlafaxine XR (EFFEXOR-XR) 75 MG 24 hr capsule, Take 1 capsule (75 mg total) by mouth daily with breakfast., Disp: 90 capsule, Rfl: 0  Current Facility-Administered Medications:    0.9 %  sodium chloride infusion, 500 mL, Intravenous, Continuous, Pyrtle, Lajuan Lines, MD    Cardiac Studies:   Coronary CTA 01/01/2019: 1. Coronary artery calcium score 50 Agatston units. This places the patient in the 97th percentile for age and gender, suggesting high risk for future cardiac events. 2.  Nonobstructive coronary disease. 3. The RCA originates from the left cusp in close proximity to the left coronary and courses interarterially between the aortic root and proximal PA to reach typical RCA territory. Would consider treadmill stress testing to assess for provocative ischemia. 4. LAD system: Mixed plaque in the proximal LAD with mild (<50%) stenosis. Circumflex system: Large PLOM, small AV LCx after take-off of PLOM. No plaque or stenosis.  Echocardiogram 01/02/2022: Severely depressed LV systolic  function with visual EF 25-30%. Left ventricle cavity is minimally dilated. Dilated cardiomyopathy. Hypokinetic global wall motion. Abnormal septal wall motion due to left bundle branch block. Doppler evidence of grade I (impaired) diastolic dysfunction, normal LAP. Left atrial cavity is mildly dilated. Structurally normal tricuspid valve with trace regurgitation. Mild pulmonary hypertension. RVSP measures 37 mmHg. Compared to 5/20232, no significant changes.   Extended EKG monitoring by Zio patch 2 days and 22 hours starting 08/27/2021: Predominant rhythm is normal sinus rhythm with bundle branch block.  There were 2 patient activated events correlating with supraventricular and ventricular ectopics.  Rare PACs and rare PVCs, PVC burden <1%.  EKG: EKG 02/27/2022: Normal sinus rhythm at rate of 74 bpm, left bundle branch block.  No further analysis.  Compared to 01/02/2022, no significant change.  Assessment     ICD-10-CM   1. Chronic systolic heart failure (HCC)  I50.22  2. 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 Type 2 diabetes mellitus without complication, without long-term current use of insulin (HCC)  E11.9     4. CRTD Medtronic COBALT XT CRT D 04/05/2022  Z95.810       No orders of the defined types were placed in this encounter.   There are no discontinued medications.    No orders of the defined types were placed in this encounter.  Recommendations:   Rebecca [name]  is a 52 y.o. Caucasian female initially with history of LBBB at least dating back to 2013.   Past medical history includes hyperthyroidism S/P RAI therapy in March 2018, depression, DM, estrogen negative right breast cancer S/P Bilateral mastectomy and adjuvant chemotherapy(Adriamycin and Cytoxan x4)  and also breast reconstruction surgery on 11/18/2017, NICM with severe LV systolic dysfunction S/P BV ICD implantation with Medtronic CRT-D on 04/05/2022.   Patient presents for a 3 month  follow-up visit, overall since dietary changes and also starting [facility], she has lost 50 pounds in weight.    1. Chronic systolic heart failure ([place]) Patient has class II-III symptoms of fatigue and dyspnea, no overt heart failure findings on physical exam.  Unfortunately in spite of guideline directed medical therapy, she continues to have low LVEF.  I have recommended consideration for BiV pacemaker/left bundle branch block pacing versus ICD and referral made for electrophysiology.  2. Primary hypertension Blood pressure is well controlled in fact is very soft.  She is also losing weight and this is contributed significantly for improving blood pressure control.  Continue present medications.  3. 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  4. Mixed hyperlipidemia Previously lipids were well controlled with LDL <100, however she has discontinued taking Crestor, I have prescribed a medication back.   In view of multiple medical comorbidity including but not limited to nonischemic cardiomyopathy, chronic systolic heart failure, class III symptoms related to cardiomyopathy, history of breast cancer, patient is planning to apply for permanent disability.  In view of the above, it would be appropriate to do so from cardiac standpoint.  I would like to see her back in 3 months for follow-up.   I spent 35 minutes with the patient.   Adrian Prows, MD, [facility] 05/29/2022, 8:03 PM Office: [phone]

## 2022-05-30 ENCOUNTER — Ambulatory Visit: Payer: 59 | Admitting: Cardiology

## 2022-05-30 ENCOUNTER — Other Ambulatory Visit: Payer: Self-pay | Admitting: Neurological Surgery

## 2022-05-30 DIAGNOSIS — E119 Type 2 diabetes mellitus without complications: Secondary | ICD-10-CM

## 2022-05-30 DIAGNOSIS — I427 Cardiomyopathy due to drug and external agent: Secondary | ICD-10-CM

## 2022-05-30 DIAGNOSIS — Z95810 Presence of automatic (implantable) cardiac defibrillator: Secondary | ICD-10-CM

## 2022-05-30 DIAGNOSIS — I5022 Chronic systolic (congestive) heart failure: Secondary | ICD-10-CM

## 2022-05-31 ENCOUNTER — Encounter (HOSPITAL_COMMUNITY)
Admission: RE | Disposition: A | Discharge status: Home / Self Care | Payer: Self-pay | Source: Ambulatory Visit | Attending: Neurological Surgery

## 2022-05-31 ENCOUNTER — Ambulatory Visit (HOSPITAL_COMMUNITY): Payer: 59 | Admitting: Certified Registered Nurse Anesthetist

## 2022-05-31 ENCOUNTER — Ambulatory Visit (HOSPITAL_COMMUNITY)
Admission: RE | Admit: 2022-05-31 | Discharge: 2022-05-31 | Disposition: A | Discharge status: Home / Self Care | Payer: 59 | Source: Ambulatory Visit | Attending: Neurological Surgery | Admitting: Neurological Surgery

## 2022-05-31 ENCOUNTER — Other Ambulatory Visit: Payer: Self-pay

## 2022-05-31 ENCOUNTER — Ambulatory Visit (HOSPITAL_COMMUNITY)
Admission: RE | Admit: 2022-05-31 | Discharge: 2022-06-01 | Disposition: A | Discharge status: Home / Self Care | Payer: 59 | Source: Ambulatory Visit | Attending: Neurological Surgery | Admitting: Neurological Surgery

## 2022-05-31 ENCOUNTER — Other Ambulatory Visit: Payer: Self-pay | Admitting: Neurological Surgery

## 2022-05-31 ENCOUNTER — Ambulatory Visit (HOSPITAL_BASED_OUTPATIENT_CLINIC_OR_DEPARTMENT_OTHER): Payer: 59 | Admitting: Certified Registered Nurse Anesthetist

## 2022-05-31 ENCOUNTER — Ambulatory Visit (HOSPITAL_COMMUNITY): Payer: 59

## 2022-05-31 ENCOUNTER — Ambulatory Visit: Admit: 2022-05-31 | Payer: 59 | Admitting: Neurological Surgery

## 2022-05-31 ENCOUNTER — Encounter (HOSPITAL_COMMUNITY): Payer: Self-pay

## 2022-05-31 VITALS — BP 117/82 | HR 66 | Temp 98.2°F | Resp 18 | Ht 69.0 in | Wt 174.0 lb

## 2022-05-31 DIAGNOSIS — M5117 Intervertebral disc disorders with radiculopathy, lumbosacral region: Secondary | ICD-10-CM | POA: Insufficient documentation

## 2022-05-31 DIAGNOSIS — I272 Pulmonary hypertension, unspecified: Secondary | ICD-10-CM | POA: Insufficient documentation

## 2022-05-31 DIAGNOSIS — Z87891 Personal history of nicotine dependence: Secondary | ICD-10-CM | POA: Diagnosis not present

## 2022-05-31 DIAGNOSIS — E119 Type 2 diabetes mellitus without complications: Secondary | ICD-10-CM | POA: Insufficient documentation

## 2022-05-31 DIAGNOSIS — M4726 Other spondylosis with radiculopathy, lumbar region: Secondary | ICD-10-CM | POA: Diagnosis not present

## 2022-05-31 DIAGNOSIS — I420 Dilated cardiomyopathy: Secondary | ICD-10-CM | POA: Diagnosis not present

## 2022-05-31 DIAGNOSIS — E039 Hypothyroidism, unspecified: Secondary | ICD-10-CM | POA: Insufficient documentation

## 2022-05-31 DIAGNOSIS — M5416 Radiculopathy, lumbar region: Secondary | ICD-10-CM

## 2022-05-31 DIAGNOSIS — I2510 Atherosclerotic heart disease of native coronary artery without angina pectoris: Secondary | ICD-10-CM | POA: Diagnosis not present

## 2022-05-31 DIAGNOSIS — I509 Heart failure, unspecified: Secondary | ICD-10-CM | POA: Insufficient documentation

## 2022-05-31 DIAGNOSIS — I110 Hypertensive heart disease with heart failure: Secondary | ICD-10-CM | POA: Diagnosis not present

## 2022-05-31 DIAGNOSIS — R933 Abnormal findings on diagnostic imaging of other parts of digestive tract: Secondary | ICD-10-CM

## 2022-05-31 DIAGNOSIS — M4316 Spondylolisthesis, lumbar region: Secondary | ICD-10-CM | POA: Insufficient documentation

## 2022-05-31 DIAGNOSIS — F419 Anxiety disorder, unspecified: Secondary | ICD-10-CM | POA: Insufficient documentation

## 2022-05-31 DIAGNOSIS — M5127 Other intervertebral disc displacement, lumbosacral region: Secondary | ICD-10-CM | POA: Insufficient documentation

## 2022-05-31 DIAGNOSIS — Z01818 Encounter for other preprocedural examination: Secondary | ICD-10-CM

## 2022-05-31 DIAGNOSIS — I10 Essential (primary) hypertension: Secondary | ICD-10-CM

## 2022-05-31 DIAGNOSIS — K219 Gastro-esophageal reflux disease without esophagitis: Secondary | ICD-10-CM | POA: Diagnosis not present

## 2022-05-31 DIAGNOSIS — E785 Hyperlipidemia, unspecified: Secondary | ICD-10-CM | POA: Insufficient documentation

## 2022-05-31 HISTORY — PX: LUMBAR LAMINECTOMY/ DECOMPRESSION WITH MET-RX: SHX5959

## 2022-05-31 LAB — BASIC METABOLIC PANEL
Anion gap: 5 (ref 5–15)
BUN: 9 mg/dL (ref 6–20)
CO2: 29 mmol/L (ref 22–32)
Calcium: 9.3 mg/dL (ref 8.9–10.3)
Chloride: 105 mmol/L (ref 98–111)
Creatinine, Ser: 0.92 mg/dL (ref 0.44–1.00)
GFR, Estimated: >60 mL/min (ref >60)
Glucose, Bld: 122 mg/dL — ABNORMAL HIGH (ref 70–99)
Potassium: 4.8 mmol/L (ref 3.5–5.1)
Sodium: 139 mmol/L (ref 135–145)

## 2022-05-31 LAB — CBC
HCT: 39.9 % (ref 36.0–46.0)
Hemoglobin: 12.8 g/dL (ref 12.0–15.0)
MCH: 27.4 pg (ref 26.0–34.0)
MCHC: 32.1 g/dL (ref 30.0–36.0)
MCV: 85.3 fL (ref 80.0–100.0)
Platelets: 173 10*3/uL (ref 150–400)
RBC: 4.68 MIL/uL (ref 3.87–5.11)
RDW: 14.1 % (ref 11.5–15.5)
WBC: 4 10*3/uL (ref 4.0–10.5)
nRBC: 0 % (ref 0.0–0.2)

## 2022-05-31 LAB — MRSA NEXT GEN BY PCR, NASAL: MRSA by PCR Next Gen: NOT DETECTED

## 2022-05-31 LAB — GLUCOSE, CAPILLARY
Glucose-Capillary: 74 mg/dL (ref 70–99)
Glucose-Capillary: 234 mg/dL — ABNORMAL HIGH (ref 70–99)
Glucose-Capillary: 82 mg/dL (ref 70–99)
Glucose-Capillary: 83 mg/dL (ref 70–99)
Glucose-Capillary: 101 mg/dL — ABNORMAL HIGH (ref 70–99)

## 2022-05-31 SURGERY — LUMBAR LAMINECTOMY/ DECOMPRESSION WITH MET-RX
Anesthesia: General | Site: Back | Laterality: Left

## 2022-05-31 MED ORDER — ACETAMINOPHEN 10 MG/ML IV SOLN
1000.0000 mg | Freq: Once | INTRAVENOUS | Status: DC | PRN
  Administered 2022-05-31: 1000 mg via INTRAVENOUS

## 2022-05-31 MED ORDER — PREGABALIN 75 MG PO CAPS: 75.0000 mg | ORAL_CAPSULE | Freq: Two times a day (BID) | ORAL | Status: DC

## 2022-05-31 MED ORDER — DIPHENHYDRAMINE HCL 50 MG/ML IJ SOLN
INTRAMUSCULAR | Status: AC
  Filled 2022-05-31: qty 1

## 2022-05-31 MED ORDER — ONDANSETRON HCL 4 MG/2ML IJ SOLN
INTRAMUSCULAR | Status: DC | PRN
  Administered 2022-05-31: 4 mg via INTRAVENOUS

## 2022-05-31 MED ORDER — LIDOCAINE-EPINEPHRINE 1 %-1:100000 IJ SOLN
INTRAMUSCULAR | Status: DC | PRN
  Administered 2022-05-31: 5 mL

## 2022-05-31 MED ORDER — LIDOCAINE 2% (20 MG/ML) 5 ML SYRINGE
INTRAMUSCULAR | Status: AC
  Filled 2022-05-31: qty 5

## 2022-05-31 MED ORDER — CEFAZOLIN SODIUM-DEXTROSE 2-4 GM/100ML-% IV SOLN
2.0000 g | Freq: Once | INTRAVENOUS | Status: AC
  Administered 2022-05-31: 2 g via INTRAVENOUS

## 2022-05-31 MED ORDER — HYDROCODONE-ACETAMINOPHEN 5-325 MG PO TABS
ORAL_TABLET | ORAL | Status: AC
  Filled 2022-05-31: qty 1

## 2022-05-31 MED ORDER — PANTOPRAZOLE SODIUM 40 MG PO TBEC: 80.0000 mg | DELAYED_RELEASE_TABLET | Freq: Every day | ORAL | Status: DC

## 2022-05-31 MED ORDER — FENTANYL CITRATE (PF) 250 MCG/5ML IJ SOLN
INTRAMUSCULAR | Status: DC | PRN
  Administered 2022-05-31: 50 ug via INTRAVENOUS

## 2022-05-31 MED ORDER — 0.9 % SODIUM CHLORIDE (POUR BTL) OPTIME
TOPICAL | Status: DC | PRN
  Administered 2022-05-31: 1000 mL

## 2022-05-31 MED ORDER — DEXAMETHASONE SODIUM PHOSPHATE 10 MG/ML IJ SOLN
INTRAMUSCULAR | Status: DC | PRN
  Administered 2022-05-31: 5 mg via INTRAVENOUS

## 2022-05-31 MED ORDER — ONDANSETRON HCL 4 MG/2ML IJ SOLN: 4.0000 mg | Freq: Four times a day (QID) | INTRAMUSCULAR | Status: DC | PRN

## 2022-05-31 MED ORDER — ROCURONIUM BROMIDE 10 MG/ML (PF) SYRINGE
PREFILLED_SYRINGE | INTRAVENOUS | Status: AC
  Filled 2022-05-31: qty 10

## 2022-05-31 MED ORDER — THROMBIN 5000 UNITS EX SOLR
CUTANEOUS | Status: AC
  Filled 2022-05-31: qty 5000

## 2022-05-31 MED ORDER — ACETAMINOPHEN 325 MG PO TABS: 325.0000 mg | ORAL_TABLET | Freq: Once | ORAL | Status: DC | PRN

## 2022-05-31 MED ORDER — LIDOCAINE 2% (20 MG/ML) 5 ML SYRINGE
INTRAMUSCULAR | Status: DC | PRN
  Administered 2022-05-31: 40 mg via INTRAVENOUS

## 2022-05-31 MED ORDER — CARVEDILOL 12.5 MG PO TABS
12.5000 mg | ORAL_TABLET | Freq: Two times a day (BID) | ORAL | Status: DC
  Administered 2022-05-31: 12.5 mg via ORAL
  Filled 2022-05-31: qty 1

## 2022-05-31 MED ORDER — DIPHENHYDRAMINE HCL 50 MG/ML IJ SOLN
INTRAMUSCULAR | Status: DC | PRN
  Administered 2022-05-31: 12.5 mg via INTRAVENOUS

## 2022-05-31 MED ORDER — LACTATED RINGERS IV SOLN: INTRAVENOUS | Status: DC

## 2022-05-31 MED ORDER — LIDOCAINE HCL (PF) 1 % IJ SOLN
5.0000 mL | Freq: Once | INTRAMUSCULAR | Status: AC
  Administered 2022-05-31: 5 mL via INTRADERMAL

## 2022-05-31 MED ORDER — FUROSEMIDE 40 MG PO TABS: 40.0000 mg | ORAL_TABLET | Freq: Every day | ORAL | Status: DC

## 2022-05-31 MED ORDER — MORPHINE SULFATE (PF) 2 MG/ML IV SOLN: 2.0000 mg | INTRAVENOUS | Status: DC | PRN

## 2022-05-31 MED ORDER — ACETAMINOPHEN 10 MG/ML IV SOLN
INTRAVENOUS | Status: AC
  Filled 2022-05-31: qty 100

## 2022-05-31 MED ORDER — EPHEDRINE SULFATE-NACL 50-0.9 MG/10ML-% IV SOSY
PREFILLED_SYRINGE | INTRAVENOUS | Status: DC | PRN
  Administered 2022-05-31 (×2): 10 mg via INTRAVENOUS
  Administered 2022-05-31 (×2): 5 mg via INTRAVENOUS

## 2022-05-31 MED ORDER — ONDANSETRON HCL 4 MG/2ML IJ SOLN
INTRAMUSCULAR | Status: AC
  Filled 2022-05-31: qty 2

## 2022-05-31 MED ORDER — MIDAZOLAM HCL 2 MG/2ML IJ SOLN
INTRAMUSCULAR | Status: DC | PRN
  Administered 2022-05-31: 2 mg via INTRAVENOUS

## 2022-05-31 MED ORDER — PROMETHAZINE HCL 25 MG/ML IJ SOLN: 6.2500 mg | INTRAMUSCULAR | Status: DC | PRN

## 2022-05-31 MED ORDER — ROCURONIUM BROMIDE 10 MG/ML (PF) SYRINGE
PREFILLED_SYRINGE | INTRAVENOUS | Status: DC | PRN
  Administered 2022-05-31: 70 mg via INTRAVENOUS

## 2022-05-31 MED ORDER — LEVOTHYROXINE SODIUM 75 MCG PO TABS
150.0000 ug | ORAL_TABLET | Freq: Every day | ORAL | Status: DC
  Administered 2022-06-01: 150 ug via ORAL
  Filled 2022-05-31: qty 2

## 2022-05-31 MED ORDER — CHLORHEXIDINE GLUCONATE 0.12 % MT SOLN: 15.0000 mL | Freq: Once | OROMUCOSAL | Status: AC

## 2022-05-31 MED ORDER — INSULIN ASPART 100 UNIT/ML IJ SOLN: 0.0000 [IU] | INTRAMUSCULAR | Status: DC | PRN

## 2022-05-31 MED ORDER — DIAZEPAM 5 MG PO TABS
ORAL_TABLET | ORAL | Status: AC
  Administered 2022-05-31: 10 mg via ORAL
  Filled 2022-05-31: qty 2

## 2022-05-31 MED ORDER — AMISULPRIDE (ANTIEMETIC) 5 MG/2ML IV SOLN: 10.0000 mg | Freq: Once | INTRAVENOUS | Status: DC | PRN

## 2022-05-31 MED ORDER — CHLORHEXIDINE GLUCONATE 0.12 % MT SOLN
OROMUCOSAL | Status: AC
  Administered 2022-05-31: 15 mL via OROMUCOSAL
  Filled 2022-05-31: qty 15

## 2022-05-31 MED ORDER — BUPIVACAINE HCL (PF) 0.5 % IJ SOLN
INTRAMUSCULAR | Status: AC
  Filled 2022-05-31: qty 30

## 2022-05-31 MED ORDER — THROMBIN 5000 UNITS EX SOLR
OROMUCOSAL | Status: DC | PRN
  Administered 2022-05-31: 5 mL via TOPICAL

## 2022-05-31 MED ORDER — ETOMIDATE 2 MG/ML IV SOLN
INTRAVENOUS | Status: DC | PRN
  Administered 2022-05-31: 10 mg via INTRAVENOUS

## 2022-05-31 MED ORDER — VENLAFAXINE HCL ER 75 MG PO CP24
75.0000 mg | ORAL_CAPSULE | Freq: Every day | ORAL | Status: DC
  Filled 2022-05-31: qty 1

## 2022-05-31 MED ORDER — DIAZEPAM 5 MG PO TABS
10.0000 mg | ORAL_TABLET | Freq: Two times a day (BID) | ORAL | Status: DC | PRN
  Administered 2022-06-01: 10 mg via ORAL
  Filled 2022-05-31: qty 2

## 2022-05-31 MED ORDER — HYDROMORPHONE HCL 1 MG/ML IJ SOLN: 0.2500 mg | INTRAMUSCULAR | Status: DC | PRN

## 2022-05-31 MED ORDER — HYDROCODONE-ACETAMINOPHEN 5-325 MG PO TABS
1.0000 | ORAL_TABLET | ORAL | Status: DC | PRN
  Administered 2022-05-31 – 2022-06-01 (×3): 2 via ORAL
  Filled 2022-05-31 (×2): qty 2

## 2022-05-31 MED ORDER — SODIUM CHLORIDE 0.9 % IV SOLN: 500.0000 mL | INTRAVENOUS | Status: DC

## 2022-05-31 MED ORDER — ALBUMIN HUMAN 5 % IV SOLN: INTRAVENOUS | Status: DC | PRN

## 2022-05-31 MED ORDER — ACETAMINOPHEN 160 MG/5ML PO SOLN: 325.0000 mg | Freq: Once | ORAL | Status: DC | PRN

## 2022-05-31 MED ORDER — ORAL CARE MOUTH RINSE: 15.0000 mL | Freq: Once | OROMUCOSAL | Status: AC

## 2022-05-31 MED ORDER — SUGAMMADEX SODIUM 200 MG/2ML IV SOLN
INTRAVENOUS | Status: DC | PRN
  Administered 2022-05-31: 200 mg via INTRAVENOUS

## 2022-05-31 MED ORDER — LORAZEPAM 0.5 MG PO TABS: 1.0000 mg | ORAL_TABLET | Freq: Two times a day (BID) | ORAL | Status: DC | PRN

## 2022-05-31 MED ORDER — MONTELUKAST SODIUM 10 MG PO TABS: 10.0000 mg | ORAL_TABLET | Freq: Every day | ORAL | Status: DC

## 2022-05-31 MED ORDER — MEPERIDINE HCL 25 MG/ML IJ SOLN: 6.2500 mg | INTRAMUSCULAR | Status: DC | PRN

## 2022-05-31 MED ORDER — SACUBITRIL-VALSARTAN 97-103 MG PO TABS
1.0000 | ORAL_TABLET | Freq: Two times a day (BID) | ORAL | Status: DC
  Filled 2022-05-31 (×2): qty 1

## 2022-05-31 MED ORDER — PHENYLEPHRINE 80 MCG/ML (10ML) SYRINGE FOR IV PUSH (FOR BLOOD PRESSURE SUPPORT)
PREFILLED_SYRINGE | INTRAVENOUS | Status: AC
  Filled 2022-05-31: qty 20

## 2022-05-31 MED ORDER — DIAZEPAM 5 MG PO TABS
10.0000 mg | ORAL_TABLET | Freq: Once | ORAL | Status: AC
  Filled 2022-05-31: qty 2

## 2022-05-31 MED ORDER — CEFAZOLIN SODIUM-DEXTROSE 2-4 GM/100ML-% IV SOLN
INTRAVENOUS | Status: AC
  Filled 2022-05-31: qty 100

## 2022-05-31 MED ORDER — FENTANYL CITRATE (PF) 250 MCG/5ML IJ SOLN
INTRAMUSCULAR | Status: AC
  Filled 2022-05-31: qty 5

## 2022-05-31 MED ORDER — IOHEXOL 180 MG/ML  SOLN
8.0000 mL | Freq: Once | INTRAMUSCULAR | Status: AC | PRN
  Administered 2022-05-31: 8 mL via INTRATHECAL

## 2022-05-31 MED ORDER — BUPIVACAINE HCL (PF) 0.5 % IJ SOLN
INTRAMUSCULAR | Status: DC | PRN
  Administered 2022-05-31: 5 mL
  Administered 2022-05-31: 25 mL

## 2022-05-31 MED ORDER — PHENYLEPHRINE 80 MCG/ML (10ML) SYRINGE FOR IV PUSH (FOR BLOOD PRESSURE SUPPORT)
PREFILLED_SYRINGE | INTRAVENOUS | Status: DC | PRN
  Administered 2022-05-31: 160 ug via INTRAVENOUS
  Administered 2022-05-31: 80 ug via INTRAVENOUS
  Administered 2022-05-31 (×2): 160 ug via INTRAVENOUS
  Administered 2022-05-31: 80 ug via INTRAVENOUS
  Administered 2022-05-31: 160 ug via INTRAVENOUS

## 2022-05-31 MED ORDER — DEXAMETHASONE SODIUM PHOSPHATE 10 MG/ML IJ SOLN
INTRAMUSCULAR | Status: AC
  Filled 2022-05-31: qty 1

## 2022-05-31 MED ORDER — ROSUVASTATIN CALCIUM 20 MG PO TABS: 20.0000 mg | ORAL_TABLET | Freq: Every day | ORAL | Status: DC

## 2022-05-31 MED ORDER — MIDAZOLAM HCL 2 MG/2ML IJ SOLN
INTRAMUSCULAR | Status: AC
  Filled 2022-05-31: qty 2

## 2022-05-31 MED ORDER — LIDOCAINE-EPINEPHRINE 1 %-1:100000 IJ SOLN
INTRAMUSCULAR | Status: AC
  Filled 2022-05-31: qty 1

## 2022-05-31 MED ORDER — EPHEDRINE 5 MG/ML INJ
INTRAVENOUS | Status: AC
  Filled 2022-05-31: qty 10

## 2022-05-31 SURGICAL SUPPLY — 48 items
ADH SKN CLS APL DERMABOND .7 (GAUZE/BANDAGES/DRESSINGS) ×1
BAG COUNTER SPONGE SURGICOUNT (BAG) ×1 IMPLANT
BAG SPNG CNTER NS LX DISP (BAG) ×1
BAND INSRT 18 STRL LF DISP RB (MISCELLANEOUS)
BAND RUBBER #18 3X1/16 STRL (MISCELLANEOUS) IMPLANT
BLADE CLIPPER SURG (BLADE) IMPLANT
BUR ACORN 6.0 (BURR) IMPLANT
BUR MATCHSTICK NEURO 3.0 LAGG (BURR) ×1 IMPLANT
CANISTER SUCT 3000ML PPV (MISCELLANEOUS) ×1 IMPLANT
DERMABOND ADVANCED .7 DNX12 (GAUZE/BANDAGES/DRESSINGS) ×1 IMPLANT
DEVICE DISSECT PLASMABLAD 3.0S (MISCELLANEOUS) ×1 IMPLANT
DRAPE HALF SHEET 40X57 (DRAPES) IMPLANT
DRAPE LAPAROTOMY T 102X78X121 (DRAPES) ×1 IMPLANT
DRAPE MICROSCOPE SLANT 54X150 (MISCELLANEOUS) IMPLANT
DRSG OPSITE POSTOP 3X4 (GAUZE/BANDAGES/DRESSINGS) IMPLANT
DURAPREP 26ML APPLICATOR (WOUND CARE) ×1 IMPLANT
ELECT REM PT RETURN 9FT ADLT (ELECTROSURGICAL) ×1
ELECTRODE REM PT RTRN 9FT ADLT (ELECTROSURGICAL) ×1 IMPLANT
GAUZE 4X4 16PLY ~~LOC~~+RFID DBL (SPONGE) IMPLANT
GAUZE SPONGE 4X4 12PLY STRL (GAUZE/BANDAGES/DRESSINGS) ×1 IMPLANT
GLOVE BIOGEL PI IND STRL 8.5 (GLOVE) ×1 IMPLANT
GLOVE ECLIPSE 8.5 STRL (GLOVE) ×1 IMPLANT
GOWN STRL REUS W/ TWL LRG LVL3 (GOWN DISPOSABLE) IMPLANT
GOWN STRL REUS W/ TWL XL LVL3 (GOWN DISPOSABLE) IMPLANT
GOWN STRL REUS W/TWL 2XL LVL3 (GOWN DISPOSABLE) ×1 IMPLANT
GOWN STRL REUS W/TWL LRG LVL3 (GOWN DISPOSABLE)
GOWN STRL REUS W/TWL XL LVL3 (GOWN DISPOSABLE)
HEMOSTAT POWDER KIT SURGIFOAM (HEMOSTASIS) ×1 IMPLANT
KIT BASIN OR (CUSTOM PROCEDURE TRAY) ×1 IMPLANT
KIT TURNOVER KIT B (KITS) ×1 IMPLANT
NDL SPNL 20GX3.5 QUINCKE YW (NEEDLE) IMPLANT
NEEDLE HYPO 22GX1.5 SAFETY (NEEDLE) ×1 IMPLANT
NEEDLE SPNL 20GX3.5 QUINCKE YW (NEEDLE) IMPLANT
NS IRRIG 1000ML POUR BTL (IV SOLUTION) ×1 IMPLANT
PACK LAMINECTOMY NEURO (CUSTOM PROCEDURE TRAY) ×1 IMPLANT
PAD ARMBOARD 7.5X6 YLW CONV (MISCELLANEOUS) ×3 IMPLANT
PATTIES SURGICAL .5 X1 (DISPOSABLE) ×1 IMPLANT
PLASMABLADE 3.0S (MISCELLANEOUS) ×1
SPIKE FLUID TRANSFER (MISCELLANEOUS) ×1 IMPLANT
SPONGE SURGIFOAM ABS GEL SZ50 (HEMOSTASIS) IMPLANT
SUT VIC AB 1 CT1 18XBRD ANBCTR (SUTURE) ×1 IMPLANT
SUT VIC AB 1 CT1 8-18 (SUTURE) ×1
SUT VIC AB 2-0 CP2 18 (SUTURE) ×1 IMPLANT
SUT VIC AB 3-0 SH 8-18 (SUTURE) ×1 IMPLANT
SUT VIC AB 4-0 RB1 18 (SUTURE) ×1 IMPLANT
TOWEL GREEN STERILE (TOWEL DISPOSABLE) ×1 IMPLANT
TOWEL GREEN STERILE FF (TOWEL DISPOSABLE) ×1 IMPLANT
WATER STERILE IRR 1000ML POUR (IV SOLUTION) ×1 IMPLANT

## 2022-05-31 NOTE — Transfer of Care (Signed)
Immediate Anesthesia Transfer of Care Note  Patient: Rebecca [name]  Procedure(s) Performed: Left Lumbar Five-Sacral One Microdiscectomy (Left: Back)  Patient Location: PACU  Anesthesia Type:General  Level of Consciousness: awake and alert   Airway & Oxygen Therapy: Patient Spontanous Breathing and Patient connected to face mask oxygen  Post-op Assessment: Report given to RN, Post -op Vital signs reviewed and stable, and Patient moving all extremities X 4  Post vital signs: Reviewed and stable  Last Vitals:  Vitals Value Taken Time  BP 103/71 05/31/22 2318  Temp    Pulse 76 05/31/22 2320  Resp 15 05/31/22 2320  SpO2 99 % 05/31/22 2320  Vitals shown include unvalidated device data.  Last Pain:  Vitals:   05/31/22 1858  TempSrc:   PainSc: 8          Complications: No notable events documented.

## 2022-05-31 NOTE — Op Note (Signed)
Date of surgery: 05/31/2022 Preoperative diagnosis: Herniated nucleus pulposus L5-S1 left recurrent with lumbar radiculopathy. Postoperative diagnosis: Same Procedure: Microdiscectomy L5-S1 left with operating microscope microdissection technique Surgeon: Kristeen Miss Anesthesia: General endotracheal Indications: Wendelin coupon he is a 52 year old individual who has had significant back and left leg pain she had a discectomy in October and did well for about a month and a half.  Then in early December she developed recurrent pain pain has been severe and unrelenting.  She had a myelogram this morning that demonstrated presence of a large recurrent disc herniation at the L5-S1 level on the left.  She is taken to the operating room to undergo surgical decompression.  Procedure: Patient was brought to the operating room supine on the stretcher.  After the smooth induction of general endotracheal anesthesia, she was carefully turned prone.  The back was prepped with alcohol DuraPrep and draped in a sterile fashion.  The previously made incision from the Metrix discectomy that she had done in October was enlarged and the dissection was carried down to the lumbodorsal fascia.  Fascia was opened on the left side of the midline and a subperiosteal dissection was performed in the interlaminar space at L5-S1.  This area was verified positively with radiography.  Then scar tissue in the interlaminar space was cleared and the laminotomy was enlarged slightly using a 2 mm bit and the high-speed bur.  A 2 mm punch was then used to enlarge the laminotomy remove the edges of bone and separate scar tissue from the common dural tube.  The path of the S1 nerve root was identified and decompressed.  Then by dissecting laterally to this mass at the disc herniation was found under some fascial layers.  This was then opened further to decompress the path of the S1 nerve root and several large fragments of disc were removed from the  epidural space.  The disc herniation was contiguous with the disc space and the disc base was entered and several more substantial fragments of disc material were removed from within the disc space.  The disc space was cleared of a substantial quantity of moderate and severely degenerated disc material both medially and laterally in the area towards the extraforaminal zone once all the disc that could be obtained was removed from this area of the path of the S1 nerve root was checked and found to be free and clear.  The common dural tube was well decompressed also.  No CSF leaks had occurred.  20 cc of half percent Marcaine was injected into the paraspinous tissues around this area.  The retractors were removed the microscope was removed and then the lumbodorsal fascia was reapproximated with #1 Vicryl in interrupted fashion 2-0 Vicryl was used in the subcutaneous tissues 3-0 Vicryl subcuticularly along with some 4-0 Vicryl in the subcuticular region Dermabond was placed on the skin blood loss was estimated about 50 cc the patient tolerated procedure well was returned to recovery room in stable condition.

## 2022-05-31 NOTE — Anesthesia Procedure Notes (Signed)
Procedure Name: Intubation Date/Time: 05/31/2022 9:25 PM  Performed by: Reece Agar, CRNAPre-anesthesia Checklist: Patient identified, Emergency Drugs available, Suction available and Patient being monitored Patient Re-evaluated:Patient Re-evaluated prior to induction Oxygen Delivery Method: Circle System Utilized Preoxygenation: Pre-oxygenation with 100% oxygen Induction Type: IV induction Ventilation: Mask ventilation without difficulty Laryngoscope Size: Mac and 3 Grade View: Grade I Tube type: Oral Tube size: 7.0 mm Number of attempts: 1 Airway Equipment and Method: Stylet Placement Confirmation: ETT inserted through vocal cords under direct vision, positive ETCO2 and breath sounds checked- equal and bilateral Secured at: 22 cm Tube secured with: Tape Dental Injury: Teeth and Oropharynx as per pre-operative assessment

## 2022-05-31 NOTE — Procedures (Signed)
Rebecca [name] he is a 52 year old individual who in October underwent a microdiscectomy for L5-S1 herniated nucleus pulposus on the left.  She tolerated that well and had good relief but about a month and a half later the pain recurred rather severely had seen her in the beginning of December and advised that she needed a new MRI however because she had just had some cardiac workup with pacemaker and defibrillator placed she was advised that she had to wait till the end of January to have the MRI.  The MRI was scheduled for earlier this week but when she arrived for the MRI study she was advised that one of the leads was not compatible with the MRI machine and this would have to be rescheduled at a different facility down the road.  Patient has been having severe and unrelenting pain in December I did a translaminar epidural injection which gave her some transient relief.  I advised the myelogram be performed in an effort to evaluate the process of pain and weakness that she is getting in the left lower extremity.  Pre op Dx: Left lumbar radiculopathy.  Herniated nucleus pulposus L5-S1 left. Post op Dx: Same Procedure: Lumbar myelogram Surgeon: Misaki Sozio Puncture level: L3-4 Fluid color: Clear colorless Injection: Iohexol 180, 8 mL Findings: Evidence of modest root cut off at L5-S1 with spondylolisthesis L4-L5 that is mobile.  Further evaluation with CT scanning.

## 2022-05-31 NOTE — Progress Notes (Signed)
Last dose of Manjaru injection was Sunday, Dr Smith Robert notified.

## 2022-05-31 NOTE — Anesthesia Preprocedure Evaluation (Addendum)
Anesthesia Evaluation  Patient identified by MRN, date of birth, ID band Patient awake    Reviewed: Allergy & Precautions, NPO status , Patient's Chart, lab work & pertinent test results  Airway Mallampati: I  TM Distance: >3 FB Neck ROM: Full    Dental  (+) Teeth Intact, Dental Advisory Given   Pulmonary former smoker   breath sounds clear to auscultation       Cardiovascular hypertension, + CAD  + dysrhythmias  Rhythm:Regular Rate:Normal  Echo: Severely depressed LV systolic function with visual EF 25-30%. Left  ventricle cavity is minimally dilated. Dilated cardiomyopathy. Hypokinetic  global wall motion. Abnormal septal wall motion due to left bundle branch  block. Doppler evidence of grade I (impaired) diastolic dysfunction,  normal LAP.  Left atrial cavity is mildly dilated.  Structurally normal tricuspid valve with trace regurgitation. Mild  pulmonary hypertension. RVSP measures 37 mmHg.     Neuro/Psych  PSYCHIATRIC DISORDERS Anxiety     negative neurological ROS     GI/Hepatic Neg liver ROS, hiatal hernia,GERD  ,,  Endo/Other  diabetes    Renal/GU negative Renal ROS     Musculoskeletal   Abdominal   Peds  Hematology   Anesthesia Other Findings   Reproductive/Obstetrics                             Anesthesia Physical Anesthesia Plan  ASA: 4 and emergent  Anesthesia Plan: General   Post-op Pain Management: Tylenol PO (pre-op)*   Induction: Intravenous  PONV Risk Score and Plan: 4 or greater and Ondansetron, Dexamethasone, Midazolam and Scopolamine patch - Pre-op  Airway Management Planned: Oral ETT  Additional Equipment: None  Intra-op Plan:   Post-operative Plan: Extubation in OR  Informed Consent: I have reviewed the patients History and Physical, chart, labs and discussed the procedure including the risks, benefits and alternatives for the proposed anesthesia with  the patient or authorized representative who has indicated his/her understanding and acceptance.     Dental advisory given  Plan Discussed with: CRNA  Anesthesia Plan Comments: (Magnet over AICD)       Anesthesia Quick Evaluation

## 2022-05-31 NOTE — H&P (Signed)
Rebecca [name] is an 52 y.o. female.   Chief Complaint: Back and left leg pain, weakness HPI: Rebecca [name] is a 53 year old individual who in October of this past year underwent a microdiscectomy at L5-S1 on the left side.  She did well for a little over a month but then suddenly developed recurrent pain in the buttock and left lower extremity which became progressively more excruciating.  We initially treated her conservatively but despite our efforts the pain got worse she had a defibrillator placed a while ago and it was advised that she should not have an MRI until the leads had a chance to heal in.  It is recommended that she wait until nearly the end of January.  When she went for the MRI they advised that because of the nature of the leads she could not tolerate the MRI here in [place].  Patient has had progressively worsening pain because of the severity of pain I advised that we do a myelogram and postmyelogram CAT scan which was completed this morning.  This demonstrates the presence of a large recurrent disc herniation at L5-S1 on the left.  Because of the intractability of her pain I advised that she undergo microdiscectomy and she is now admitted for this procedure.  Past Medical History:  Diagnosis Date   Acute respiratory disease due to COVID-19 virus 12/25/2019   Allergy    constant hives post chemo therapy.  All allergen tests have been neg.   Anxiety    Breast cancer ([place])    Coronary artery disease    CRTD Medtronic [place] XT CRT D 04/05/2022 04/05/2022   Diabetes mellitus without complication ([place])    Diverticulitis    Encounter for assessment of implantable cardioverter-defibrillator (ICD) 04/06/2022   GERD (gastroesophageal reflux disease)    Heart failure ([place]) 01/02/2022   EF 25 to 30%   Hiatal hernia    Hyperlipidemia    Hypertension    Hypothyroidism    LBBB (left bundle branch block)    Pneumonia     Past Surgical History:  Procedure Laterality Date    BIV ICD INSERTION CRT-D N/A 04/05/2022   Procedure: BIV ICD INSERTION CRT-D;  Surgeon: Evans Lance, MD;  Location: [facility];  Service: Cardiovascular;  Laterality: N/A;   BREAST RECONSTRUCTION WITH PLACEMENT OF TISSUE EXPANDER AND ALLODERM Bilateral 11/18/2017   Procedure: BILATERAL BREAST RECONSTRUCTION WITH PLACEMENT OF TISSUE EXPANDER AND ALLODERM;  Surgeon: Irene Limbo, MD;  Location: [facility];  Service: Plastics;  Laterality: Bilateral;   CESAREAN SECTION  2007/2010   x2   COLONOSCOPY  2017   COLONOSCOPY WITH PROPOFOL N/A 03/19/2022   Procedure: COLONOSCOPY WITH PROPOFOL;  Surgeon: Jerene Bears, MD;  Location: [facility];  Service: Gastroenterology;  Laterality: N/A;   LAPAROSCOPIC TUBAL LIGATION  06/12/2011   Procedure: LAPAROSCOPIC TUBAL LIGATION;  Surgeon: Daria Pastures, MD;  Location: [facility];  Service: Gynecology;  Laterality: N/A;  filshie clip   LIPOSUCTION WITH LIPOFILLING Bilateral 02/24/2018   Procedure: LIPOFILLING FROM ABDOMEN TO BILATERAL CHEST;  Surgeon: Irene Limbo, MD;  Location: [facility];  Service: Plastics;  Laterality: Bilateral;   LUMBAR LAMINECTOMY/ DECOMPRESSION WITH MET-RX Left 02/08/2022   Procedure: Left Lumbar Five-Sacral One Microdiscectomy with Metrex;  Surgeon: Kristeen Miss, MD;  Location: [facility];  Service: Neurosurgery;  Laterality: Left;  3C/RM 20   MASTECTOMY W/ SENTINEL NODE BIOPSY Bilateral 11/18/2017   Procedure: BILATERAL TOTAL MASTECTOMIES WITH RIGHT SENTINEL LYMPH NODE  BIOPSY;  Surgeon: Rolm Bookbinder, MD;  Location: [facility];  Service: General;  Laterality: Bilateral;   mini tuck  2012   abdominal   PORT-A-CATH REMOVAL Right 01/05/2019   PORTACATH PLACEMENT Right 07/15/2017   Procedure: INSERTION PORT-A-CATH WITH ULTRASOUND;  Surgeon: Rolm Bookbinder, MD;  Location: [facility];  Service: General;  Laterality: Right;   REMOVAL OF BILATERAL TISSUE  EXPANDERS WITH PLACEMENT OF BILATERAL BREAST IMPLANTS Bilateral 02/24/2018   Procedure: REMOVAL OF BILATERAL TISSUE EXPANDERS WITH PLACEMENT OF BILATERAL BREAST IMPLANTS;  Surgeon: Irene Limbo, MD;  Location: [facility];  Service: Plastics;  Laterality: Bilateral;   right breast cancer     upper-outer quadrant    right ear surg  03/2008   Mohs   TUBAL LIGATION     US ECHOCARDIOGRAPHY  12-30-2007   EF 55-60%   WISDOM TOOTH EXTRACTION      Family History  Problem Relation Age of Onset   Lymphoma Father    Cancer Father        lymphoma   Diabetes Brother    Diabetes Mother    Liver disease Mother        NASH, d. 16   Asthma Mother    CAD Maternal Grandmother    CAD Maternal Grandfather    Stomach cancer Neg Hx    Colon cancer Neg Hx    Colon polyps Neg Hx    Esophageal cancer Neg Hx    Rectal cancer Neg Hx    Social History:  reports that she quit smoking about 17 years ago. Her smoking use included cigarettes. She has a 1.50 pack-year smoking history. She has never used smokeless tobacco. She reports current alcohol use. She reports that she does not use drugs.  Allergies: No Known Allergies  (Not in a hospital admission)   Results for orders placed or performed during the hospital encounter of 05/31/22 (from the past 48 hour(s))  Glucose, capillary     Status: None   Collection Time: 05/31/22  8:14 AM  Result Value Ref Range   Glucose-Capillary 74 70 - 99 mg/dL    Comment: Glucose reference range applies only to samples taken after fasting for at least 8 hours.   DG Myelogram Lumbar  Result Date: 05/31/2022 Kristeen Miss, MD     05/31/2022  8:53 AM Rebecca [name] he is a 52 year old individual who in October underwent a microdiscectomy for L5-S1 herniated nucleus pulposus on the left.  She tolerated that well and had good relief but about a month and a half later the pain recurred rather severely had seen her in the beginning of December and advised that  she needed a new MRI however because she had just had some cardiac workup with pacemaker and defibrillator placed she was advised that she had to wait till the end of January to have the MRI.  The MRI was scheduled for earlier this week but when she arrived for the MRI study she was advised that one of the leads was not compatible with the MRI machine and this would have to be rescheduled at a different facility down the road.  Patient has been having severe and unrelenting pain in December I did a translaminar epidural injection which gave her some transient relief.  I advised the myelogram be performed in an effort to evaluate the process of pain and weakness that she is getting in the left lower extremity. Pre op Dx: Left lumbar radiculopathy.  Herniated nucleus  pulposus L5-S1 left. Post op Dx: Same Procedure: Lumbar myelogram Surgeon: Olman Yono Puncture level: L3-4 Fluid color: Clear colorless Injection: Iohexol 180, 8 mL Findings: Evidence of modest root cut off at L5-S1 with spondylolisthesis L4-L5 that is mobile.  Further evaluation with CT scanning.   Review of Systems  Constitutional:  Positive for activity change.  Musculoskeletal:  Positive for back pain, gait problem and myalgias.  Neurological:  Positive for weakness and numbness.  All other systems reviewed and are negative.   Blood pressure 104/77, pulse 80, temperature 98.1 F (36.7 C), temperature source Oral, resp. rate 15, height '5\' 9"'$  (1.753 m), weight 78.9 kg, SpO2 95 %. Physical Exam Constitutional:      Appearance: Normal appearance.  HENT:     Head: Normocephalic and atraumatic.     Right Ear: Tympanic membrane, ear canal and external ear normal.     Left Ear: Tympanic membrane, ear canal and external ear normal.     Nose: Nose normal.     Mouth/Throat:     Mouth: Mucous membranes are dry.     Pharynx: Oropharynx is clear.  Eyes:     Extraocular Movements: Extraocular movements intact.     Conjunctiva/sclera: Conjunctivae  normal.     Pupils: Pupils are equal, round, and reactive to light.  Cardiovascular:     Rate and Rhythm: Normal rate and regular rhythm.  Pulmonary:     Effort: Pulmonary effort is normal.     Breath sounds: Normal breath sounds.  Abdominal:     General: Abdomen is flat. Bowel sounds are normal.     Palpations: Abdomen is soft.  Musculoskeletal:     Cervical back: Normal range of motion and neck supple.     Comments: Positive straight leg raising at 15 degrees on the left positive on the right for left leg pain at 45 degrees  Skin:    General: Skin is warm and dry.     Capillary Refill: Capillary refill takes less than 2 seconds.  Neurological:     Mental Status: She is alert.     Comments: Significant weakness in the gastroc on the left with fibrillations and fasciculations noted on that left leg.  She has absent Achilles reflex and strength is graded 4 out of 5 in the gastroc.  Sensation is diminished into the bottom and lateral aspect of the left foot.  Right lower extremity strength is intact.  Proximal strength in iliopsoas and quadriceps is intact tibialis anterior strength suggest some mild weakness on the left at 4- out of 5.  Cranial nerve examination upper extremity strength is normal.  Psychiatric:        Mood and Affect: Mood normal.        Behavior: Behavior normal.        Thought Content: Thought content normal.        Judgment: Judgment normal.      Assessment/Plan Herniated nucleus pulposus L5-S1 left, recurrent.  Plan: Microdiscectomy and decompression of the herniated nucleus pulposus.  Earleen Newport, MD 05/31/2022, 10:36 AM

## 2022-06-01 ENCOUNTER — Encounter (HOSPITAL_COMMUNITY): Payer: Self-pay | Admitting: Neurological Surgery

## 2022-06-01 ENCOUNTER — Other Ambulatory Visit: Payer: Self-pay | Admitting: Internal Medicine

## 2022-06-01 DIAGNOSIS — M5117 Intervertebral disc disorders with radiculopathy, lumbosacral region: Secondary | ICD-10-CM | POA: Diagnosis not present

## 2022-06-01 MED ORDER — PHENOL 1.4 % MT LIQD: 1.0000 | OROMUCOSAL | Status: DC | PRN

## 2022-06-01 MED ORDER — SENNA 8.6 MG PO TABS
1.0000 | ORAL_TABLET | Freq: Two times a day (BID) | ORAL | Status: DC
  Administered 2022-06-01: 8.6 mg via ORAL
  Filled 2022-06-01: qty 1

## 2022-06-01 MED ORDER — OXYCODONE-ACETAMINOPHEN 5-325 MG PO TABS: 1.0000 | ORAL_TABLET | Freq: Four times a day (QID) | ORAL | 0 refills | Status: DC | PRN

## 2022-06-01 MED ORDER — ONDANSETRON HCL 4 MG/2ML IJ SOLN: 4.0000 mg | Freq: Four times a day (QID) | INTRAMUSCULAR | Status: DC | PRN

## 2022-06-01 MED ORDER — ACETAMINOPHEN 325 MG PO TABS: 650.0000 mg | ORAL_TABLET | ORAL | Status: DC | PRN

## 2022-06-01 MED ORDER — CEFAZOLIN SODIUM-DEXTROSE 2-4 GM/100ML-% IV SOLN
2.0000 g | Freq: Three times a day (TID) | INTRAVENOUS | Status: AC
  Administered 2022-06-01 (×2): 2 g via INTRAVENOUS
  Filled 2022-06-01 (×2): qty 100

## 2022-06-01 MED ORDER — POLYETHYLENE GLYCOL 3350 17 G PO PACK: 17.0000 g | PACK | Freq: Every day | ORAL | Status: DC | PRN

## 2022-06-01 MED ORDER — SODIUM CHLORIDE 0.9% FLUSH: 3.0000 mL | Freq: Two times a day (BID) | INTRAVENOUS | Status: DC

## 2022-06-01 MED ORDER — BISACODYL 10 MG RE SUPP: 10.0000 mg | Freq: Every day | RECTAL | Status: DC | PRN

## 2022-06-01 MED ORDER — OXYCODONE-ACETAMINOPHEN 5-325 MG PO TABS: 1.0000 | ORAL_TABLET | ORAL | Status: DC | PRN

## 2022-06-01 MED ORDER — MENTHOL 3 MG MT LOZG: 1.0000 | LOZENGE | OROMUCOSAL | Status: DC | PRN

## 2022-06-01 MED ORDER — SODIUM CHLORIDE 0.9 % IV SOLN: 250.0000 mL | INTRAVENOUS | Status: DC

## 2022-06-01 MED ORDER — ALUM & MAG HYDROXIDE-SIMETH 200-200-20 MG/5ML PO SUSP: 30.0000 mL | Freq: Four times a day (QID) | ORAL | Status: DC | PRN

## 2022-06-01 MED ORDER — METHOCARBAMOL 1000 MG/10ML IJ SOLN: 500.0000 mg | Freq: Four times a day (QID) | INTRAVENOUS | Status: DC | PRN

## 2022-06-01 MED ORDER — DOCUSATE SODIUM 100 MG PO CAPS
100.0000 mg | ORAL_CAPSULE | Freq: Two times a day (BID) | ORAL | Status: DC
  Administered 2022-06-01: 100 mg via ORAL
  Filled 2022-06-01: qty 1

## 2022-06-01 MED ORDER — ACETAMINOPHEN 650 MG RE SUPP: 650.0000 mg | RECTAL | Status: DC | PRN

## 2022-06-01 MED ORDER — ONDANSETRON HCL 4 MG PO TABS: 4.0000 mg | ORAL_TABLET | Freq: Four times a day (QID) | ORAL | Status: DC | PRN

## 2022-06-01 MED ORDER — METHOCARBAMOL 500 MG PO TABS
500.0000 mg | ORAL_TABLET | Freq: Four times a day (QID) | ORAL | Status: DC | PRN
  Administered 2022-06-01: 500 mg via ORAL
  Filled 2022-06-01: qty 1

## 2022-06-01 MED ORDER — SODIUM CHLORIDE 0.9% FLUSH: 3.0000 mL | INTRAVENOUS | Status: DC | PRN

## 2022-06-01 MED ORDER — FLEET ENEMA 7-19 GM/118ML RE ENEM: 1.0000 | ENEMA | Freq: Once | RECTAL | Status: DC | PRN

## 2022-06-01 MED ORDER — MORPHINE SULFATE (PF) 2 MG/ML IV SOLN: 2.0000 mg | INTRAVENOUS | Status: DC | PRN

## 2022-06-01 NOTE — Care Management (Signed)
Patient with order to DC to home today. Unit staff to provide DME needed for home.   No HH needs identified Patient will have family/ friends provide transportation home. No other TOC needs identified for DC 

## 2022-06-01 NOTE — Evaluation (Signed)
Occupational Therapy Evaluation Patient Details Name: Rebecca [name] MRN: [id number] DOB: 12/25/1970 Today's Date: 06/01/2022   History of Present Illness Pt is a 52 y.o. female s/p Left lumbar 5 -sacral 1 microdiscectomy. PMH significant for spinal surgery, COVID,  heart failure, insertion of cardioverter-defibrillator, anxiety, breast cancer, CAD, GERD, HTN, LBBB, and hypothyroidism.   Clinical Impression   PTA, pt lived with her family and was independent. Upon eval, pt is mod I for ADL for increased time. Pt educated and demonstrating use of compensatory techniques for bed mobility, LB ADL, grooming, shower transfers, stair navigation, toileting, and car transfers within precautions. Pt asking questions relevant to spinal precautions and when she can stop adhering to precautions. Max encouragement to strictly adhere to precautions until directed otherwise by MD. Pt able to recall all information provided with teachback method. Recommending discharge home with no OT follow up at this time. OT to sign off. Please re-consult if change in status.      Recommendations for follow up therapy are one component of a multi-disciplinary discharge planning process, led by the attending physician.  Recommendations may be updated based on patient status, additional functional criteria and insurance authorization.   Follow Up Recommendations  No OT follow up     Assistance Recommended at Discharge Intermittent Supervision/Assistance  Patient can return home with the following Help with stairs or ramp for entrance;A little help with bathing/dressing/bathroom;Assistance with cooking/housework;Assist for transportation    Functional Status Assessment  Patient has had a recent decline in their functional status and demonstrates the ability to make significant improvements in function in a reasonable and predictable amount of time.  Equipment Recommendations  None recommended by OT    Recommendations  for Other Services       Precautions / Restrictions Precautions Precautions: Back Precaution Booklet Issued: Yes (comment) Precaution Comments: All precautions reviewed within the context of ADL Required Braces or Orthoses:  (no brace needed order) Restrictions Weight Bearing Restrictions: No      Mobility Bed Mobility Overal bed mobility: Modified Independent             General bed mobility comments: increased time    Transfers Overall transfer level: Modified independent Equipment used: None               General transfer comment: increasd time      Balance Overall balance assessment: Mild deficits observed, not formally tested                                         ADL either performed or assessed with clinical judgement   ADL Overall ADL's : Modified independent                                       General ADL Comments: Mod I for increased time. Using compensatory techniques for ADL     Vision Baseline Vision/History: 0 No visual deficits Ability to See in Adequate Light: 0 Adequate Patient Visual Report: No change from baseline Vision Assessment?: No apparent visual deficits     Perception Perception Perception Tested?: No   Praxis Praxis Praxis tested?: Not tested    Pertinent Vitals/Pain Pain Assessment Pain Assessment: Faces Faces Pain Scale: Hurts a little bit Pain Location: LLE Pain Descriptors / Indicators: Discomfort Pain Intervention(s): Limited activity  within patient's tolerance, Monitored during session     Hand Dominance     Extremity/Trunk Assessment Upper Extremity Assessment Upper Extremity Assessment: Overall WFL for tasks assessed   Lower Extremity Assessment Lower Extremity Assessment: Generalized weakness;LLE deficits/detail LLE Deficits / Details: decr strength as compared to R; greater pain experience   Cervical / Trunk Assessment Cervical / Trunk Assessment: Back  Surgery   Communication Communication Communication: No difficulties   Cognition Arousal/Alertness: Awake/alert Behavior During Therapy: WFL for tasks assessed/performed Overall Cognitive Status: Within Functional Limits for tasks assessed                                 General Comments: Aware of all precautions and performing ADL within precautions. Cues intermittently for resting positions; attempting to rest foot on bedrail bringing knee close to chest     General Comments  VSS    Exercises     Shoulder Instructions      Home Living Family/patient expects to be discharged to:: Private residence Living Arrangements: Children;Spouse/significant other Available Help at Discharge: Family Type of Home: House Home Access: Stairs to enter Technical brewer of Steps: 4 Entrance Stairs-Rails: Right;Left Home Layout: Able to live on main level with bedroom/bathroom (has basement)     Bathroom Shower/Tub: Occupational psychologist: Standard     Home Equipment: Cane - single point          Prior Functioning/Environment Prior Level of Function : Independent/Modified Independent             Mobility Comments: use of cane for the past 1.5 weeks ADLs Comments: has not been navigating stairs unless she has to        OT Problem List: Decreased strength;Decreased activity tolerance;Impaired balance (sitting and/or standing);Decreased knowledge of use of DME or AE;Decreased knowledge of precautions;Pain      OT Treatment/Interventions:      OT Goals(Current goals can be found in the care plan section) Acute Rehab OT Goals Patient Stated Goal: no more pain OT Goal Formulation: With patient  OT Frequency:      Co-evaluation              AM-PAC OT "6 Clicks" Daily Activity     Outcome Measure Help from another person eating meals?: None Help from another person taking care of personal grooming?: None Help from another person  toileting, which includes using toliet, bedpan, or urinal?: None Help from another person bathing (including washing, rinsing, drying)?: None Help from another person to put on and taking off regular upper body clothing?: None Help from another person to put on and taking off regular lower body clothing?: None 6 Click Score: 24   End of Session Nurse Communication: Mobility status  Activity Tolerance: Patient tolerated treatment well Patient left: in bed;with call bell/phone within reach  OT Visit Diagnosis: Unsteadiness on feet (R26.81);Muscle weakness (generalized) (M62.81);Other abnormalities of gait and mobility (R26.89);Pain Pain - part of body:  (back)                Time: 2423-5361 OT Time Calculation (min): 20 min Charges:  OT General Charges $OT Visit: 1 Visit OT Evaluation $OT Eval Low Complexity: 1 Low  Elder Cyphers, OTR/L [facility] Acute Rehabilitation Office: [phone]   Magnus Ivan 06/01/2022, 8:35 AM

## 2022-06-01 NOTE — Anesthesia Postprocedure Evaluation (Signed)
Anesthesia Post Note  Patient: Rebecca [name]  Procedure(s) Performed: Left Lumbar Five-Sacral One Microdiscectomy (Left: Back)     Patient location during evaluation: PACU Anesthesia Type: General Level of consciousness: awake and alert Pain management: pain level controlled Vital Signs Assessment: post-procedure vital signs reviewed and stable Respiratory status: spontaneous breathing, nonlabored ventilation, respiratory function stable and patient connected to nasal cannula oxygen Cardiovascular status: blood pressure returned to baseline and stable Postop Assessment: no apparent nausea or vomiting Anesthetic complications: no  No notable events documented.  Last Vitals:  Vitals:   05/31/22 2345 06/01/22 0000  BP: 101/72 107/72  Pulse: 81 73  Resp: (!) 21 (!) 27  Temp:  37.1 C  SpO2: 95% 93%    Last Pain:  Vitals:   06/01/22 0000  TempSrc:   PainSc: 0-No pain                 Effie Berkshire

## 2022-06-01 NOTE — Discharge Summary (Signed)
Physician Discharge Summary  Patient ID: Rebecca [name] MRN: [id number] DOB/AGE: 09-06-1970 52 y.o.  Admit date: 05/31/2022 Discharge date: 06/01/2022  Admission Diagnoses: Herniated nucleus pulposus L5-S1 left, recurrent.  Lumbar radiculopathy  Discharge Diagnoses: Herniated nucleus pulposus L5-S1 left, recurrent.  Lumbar radiculopathy Active Problems:   * No active hospital problems. *   Discharged Condition: good  Hospital Course: She was admitted to undergo myelogram yesterday it was noted that she had a large extruded fragment of disc at L5-S1 on the left.  She is in severe and excruciating pain being controlled well with oral none steroidals and pain medication.  She underwent urgent microdiscectomy in an effort to relieve her pain which was successful.  Consults: None  Significant Diagnostic Studies: None  Treatments: surgery: See op note  Discharge Exam: Blood pressure 117/82, pulse (!) 112, temperature 98.2 F (36.8 C), resp. rate 18, height '5\' 9"'$  (1.753 m), weight 78.9 kg, SpO2 100 %. Incision is clean and dry motor function is intact.  Disposition: Discharge disposition: 01-Home or Self Care       Discharge Instructions     Call MD for:  redness, tenderness, or signs of infection (pain, swelling, redness, odor or green/yellow discharge around incision site)   Complete by: As directed    Call MD for:  severe uncontrolled pain   Complete by: As directed    Call MD for:  temperature >100.4   Complete by: As directed    Diet - low sodium heart healthy   Complete by: As directed    Discharge instructions   Complete by: As directed    Okay to shower.  Remove outer honeycomb dressing in 48 hours.  Do not apply salves or appointments to incision. No heavy lifting with the upper extremities greater than 10 pounds. May resume driving when not requiring pain medication and patient feels comfortable with doing so.   Incentive spirometry RT   Complete by: As  directed    Increase activity slowly   Complete by: As directed       Allergies as of 06/01/2022   No Known Allergies      Medication List     STOP taking these medications    oxyCODONE-acetaminophen 10-325 MG tablet Commonly known as: PERCOCET Replaced by: oxyCODONE-acetaminophen 5-325 MG tablet       TAKE these medications    [employer]ayer [employer] Body 500-32.5 MG Tabs Generic drug: Aspirin-Caffeine Take 2 tablets by mouth daily as needed (Pain).   carvedilol 12.5 MG tablet Commonly known as: COREG TAKE 1 TABLET BY MOUTH TWICE A DAY   Depo-Testosterone 200 MG/ML injection Generic drug: testosterone cypionate Inject 200 mg into the muscle every 3 (three) months.   diazepam 10 MG tablet Commonly known as: VALIUM Take 1 tablet (10 mg total) by mouth every 12 (twelve) hours as needed for anxiety.   Entresto 97-103 MG Generic drug: sacubitril-valsartan Take 1 tablet by mouth 2 (two) times daily.   EPINEPHrine 0.3 mg/0.3 mL Soaj injection Commonly known as: EPI-PEN Use as directed for life threatening allergic reactions only What changed:  how much to take how to take this when to take this reasons to take this   esomeprazole 40 MG capsule Commonly known as: NEXIUM TAKE 1 CAPSULE (40 MG TOTAL) BY MOUTH DAILY AT 12 NOON.   furosemide 40 MG tablet Commonly known as: LASIX TAKE 1 TABLET BY MOUTH EVERY DAY AS NEEDED FOR FLUID *SHORTNESS OF BREATH* What changed: See the new instructions.  levothyroxine 150 MCG tablet Commonly known as: SYNTHROID TAKE 1 TABLET BY MOUTH EVERY DAY   LORazepam 1 MG tablet Commonly known as: ATIVAN TAKE 1 TABLET BY MOUTH TWICE A DAY AS NEEDED What changed:  when to take this reasons to take this   meloxicam 15 MG tablet Commonly known as: MOBIC Take 15 mg by mouth daily.   montelukast 10 MG tablet Commonly known as: SINGULAIR TAKE ONE TABLET BY MOUTH DAILY   Motrin IB 200 MG tablet Generic drug: ibuprofen Take 400 mg by  mouth every 6 (six) hours as needed for mild pain or moderate pain.   oxyCODONE-acetaminophen 5-325 MG tablet Commonly known as: Percocet Take 1-2 tablets by mouth every 6 (six) hours as needed for severe pain. Replaces: oxyCODONE-acetaminophen 10-325 MG tablet   pregabalin 75 MG capsule Commonly known as: LYRICA Take 75 mg by mouth 2 (two) times daily.   rosuvastatin 20 MG tablet Commonly known as: CRESTOR Take 1 tablet (20 mg total) by mouth daily.   tirzepatide 15 MG/0.5ML Pen Commonly known as: MOUNJARO Inject 15 mg into the skin once a week.   Vagifem 10 MCG Tabs vaginal tablet Generic drug: Estradiol Place 1 tablet vaginally 2 (two) times a week.   venlafaxine XR 75 MG 24 hr capsule Commonly known as: EFFEXOR-XR Take 1 capsule (75 mg total) by mouth daily with breakfast.         Signed: Blanchie Dessert Windell Musson 06/01/2022, 9:41 AM

## 2022-06-01 NOTE — Progress Notes (Signed)
Patient alert and oriented, mae's well, voiding adequate amount of urine, swallowing without difficulty, no c/o pain at time of discharge. Patient discharged home with Husband. Script and discharged instructions given to patient. Patient and Spouse stated understanding of instructions given. Patient has an appointment with Dr. Ellene Route in 2 weeks.

## 2022-06-01 NOTE — Progress Notes (Signed)
Physical Therapy Note  Spoke with occupational therapy after initial evaluation. OT reports patient is functioning at a high level of independence and no physical therapy is indicated at this time. PT is signing-off. Please re-order if there is any significant change in status. Thank you for this referral.  Candie Mile, PT, DPT Physical Therapist Acute Rehabilitation Services [facility]

## 2022-06-03 LAB — GLUCOSE, CAPILLARY: Glucose-Capillary: 180 mg/dL — ABNORMAL HIGH (ref 70–99)

## 2022-06-03 NOTE — Telephone Encounter (Signed)
Spoke with patient and she stated she does need the Rx.  She also stated she is recovering well and will call the office to schedule an appt when better.

## 2022-06-20 ENCOUNTER — Encounter: Payer: Self-pay | Admitting: Internal Medicine

## 2022-06-21 ENCOUNTER — Other Ambulatory Visit: Payer: Self-pay | Admitting: Internal Medicine

## 2022-06-21 NOTE — Telephone Encounter (Signed)
Dr.Baxley has refilled 1 x before.

## 2022-06-21 NOTE — Telephone Encounter (Signed)
Patient stated she is completley out our Rx due to having a second surgery.  She stated she saw Dr. Ellene Route today and he gave her a steroid, she also stated that if Dr. Renold Genta is not comfortable refilling it is ok. She mentioned her father passing away 2 days ago and will call the office when she is able to come in for an office visit.

## 2022-07-03 ENCOUNTER — Encounter: Payer: Self-pay | Admitting: Cardiology

## 2022-07-03 DIAGNOSIS — I427 Cardiomyopathy due to drug and external agent: Secondary | ICD-10-CM

## 2022-07-03 DIAGNOSIS — I5022 Chronic systolic (congestive) heart failure: Secondary | ICD-10-CM

## 2022-07-03 MED ORDER — ENTRESTO 97-103 MG PO TABS: 0.5000 | ORAL_TABLET | Freq: Two times a day (BID) | ORAL | 3 refills | Status: DC

## 2022-07-03 MED ORDER — CARVEDILOL 12.5 MG PO TABS: 6.2500 mg | ORAL_TABLET | Freq: Two times a day (BID) | ORAL | 3 refills | Status: DC

## 2022-07-03 NOTE — Telephone Encounter (Signed)
From patient

## 2022-07-03 NOTE — Telephone Encounter (Signed)
Patient having severe dizziness and near syncope with low blood pressure, will reduce the dose of carvedilol from 12.5 mg to 6.25 mg twice daily and Entresto 97/1 3 mg to 1/2 tablet twice daily.  Will set office visit.

## 2022-07-04 NOTE — Telephone Encounter (Signed)
Needs an appt

## 2022-07-04 NOTE — Telephone Encounter (Signed)
Please schedule appointment. Thank you

## 2022-07-09 ENCOUNTER — Other Ambulatory Visit: Payer: Self-pay | Admitting: Internal Medicine

## 2022-07-09 ENCOUNTER — Encounter: Payer: Self-pay | Admitting: Hematology and Oncology

## 2022-07-13 ENCOUNTER — Other Ambulatory Visit: Payer: Self-pay | Admitting: Hematology and Oncology

## 2022-07-16 ENCOUNTER — Encounter: Payer: Self-pay | Admitting: Cardiology

## 2022-07-16 ENCOUNTER — Ambulatory Visit: Payer: 59 | Admitting: Cardiology

## 2022-07-16 VITALS — BP 90/56 | HR 96 | Resp 16 | Ht 69.0 in | Wt 163.0 lb

## 2022-07-16 DIAGNOSIS — I951 Orthostatic hypotension: Secondary | ICD-10-CM

## 2022-07-16 DIAGNOSIS — Z95810 Presence of automatic (implantable) cardiac defibrillator: Secondary | ICD-10-CM

## 2022-07-16 DIAGNOSIS — I427 Cardiomyopathy due to drug and external agent: Secondary | ICD-10-CM

## 2022-07-16 DIAGNOSIS — I5022 Chronic systolic (congestive) heart failure: Secondary | ICD-10-CM

## 2022-07-16 MED ORDER — ENTRESTO 24-26 MG PO TABS: 1.0000 | ORAL_TABLET | Freq: Two times a day (BID) | ORAL | 3 refills | Status: DC

## 2022-07-16 MED ORDER — CARVEDILOL 3.125 MG PO TABS: 3.1250 mg | ORAL_TABLET | Freq: Two times a day (BID) | ORAL | 3 refills | Status: DC

## 2022-07-16 MED ORDER — METOPROLOL SUCCINATE ER 25 MG PO TB24: 25.0000 mg | ORAL_TABLET | Freq: Every day | ORAL | 1 refills | Status: DC

## 2022-07-16 NOTE — Progress Notes (Signed)
Primary Physician/Referring:  Margaree Mackintosh, MD  Patient ID: Rebecca [name], female    DOB: 08/24/1970, 52 y.o.   MRN: [id number]  Appointment date changed  HPI:    Rebecca [name]  is a 52 y.o. Caucasian female initially with history of LBBB at least dating back to 2013.   Past medical history includes hyperthyroidism S/P RAI therapy in March 2018, depression, DM, estrogen negative right breast cancer S/P Bilateral mastectomy and adjuvant chemotherapy(Adriamycin and Cytoxan x4)  and also breast reconstruction surgery on 11/18/2017. She has since developed severe LV systolic dysfunction and due to persistent low EF and dyspnea underwent BV ICD implantation with Medtronic CRT-D on 04/05/2022.  This is a 3 month OV.  She has had several episodes of syncope and near syncope and after telephone visit, I had reduced the dose of the carvedilol from 6.25 mg to 3.125 mg twice daily and Entresto from 97/104 mg to one half twice daily.  States that she is feeling better and she has discontinued taking only Entresto as she did not have carvedilol left, since then no further near syncope or significant dizziness.  Past Medical History:  Diagnosis Date   Acute respiratory disease due to COVID-19 virus 12/25/2019   Allergy    constant hives post chemo therapy.  All allergen tests have been neg.   Anxiety    Breast cancer (HCC)    Coronary artery disease    CRTD Medtronic COBALT XT CRT D 04/05/2022 04/05/2022   Diabetes mellitus without complication (HCC)    Diverticulitis    Encounter for assessment of implantable cardioverter-defibrillator (ICD) 04/06/2022   GERD (gastroesophageal reflux disease)    Heart failure (HCC) 01/02/2022   EF 25 to 30%   Hiatal hernia    Hyperlipidemia    Hypertension    Hypothyroidism    LBBB (left bundle branch block)    Pneumonia     Social History   Tobacco Use   Smoking status: Former    Packs/day: 0.25    Years: 6.00    Additional pack years:  0.00    Total pack years: 1.50    Types: Cigarettes    Quit date: 03/29/2005    Years since quitting: 17.3   Smokeless tobacco: Never  Substance Use Topics   Alcohol use: Yes    Comment: socially  Marital Status: Married    ROS  Review of Systems  Constitutional: Positive for weight loss (intentional).  Cardiovascular:  Negative for chest pain, dyspnea on exertion and leg swelling.   Objective      07/16/2022   10:35 AM 06/01/2022   10:13 AM 06/01/2022   10:12 AM  Vitals with BMI  Height 5\' 9"     Weight 163 lbs    BMI 24.06    Systolic 90    Diastolic 56    Pulse 96 66 25    Blood pressure (!) 90/56, pulse 96, resp. rate 16, height 5\' 9"  (1.753 m), weight 163 lb (73.9 kg), SpO2 96 %. Body mass index is 24.07 kg/m.   Orthostatic VS for the past 72 hrs (Last 3 readings):  Orthostatic BP Patient Position BP Location Cuff Size Orthostatic Pulse  07/16/22 1045 (!) 82/55 Standing Left Arm Normal 77  07/16/22 1044 93/61 Sitting Left Arm Normal 98  07/16/22 1043 101/69 Supine Left Arm Normal 95    Physical Exam Neck:     Vascular: No JVD.  Cardiovascular:     Rate and Rhythm: Normal rate  and regular rhythm.     Pulses: Intact distal pulses.     Heart sounds: Normal heart sounds. No murmur heard.    No gallop.  Pulmonary:     Effort: Pulmonary effort is normal.     Breath sounds: Normal breath sounds.  Abdominal:     General: Bowel sounds are normal.     Palpations: Abdomen is soft.  Musculoskeletal:     Right lower leg: No edema.     Left lower leg: No edema.   Radiology: Laboratory examination:   Recent Labs    03/04/22 1506 04/02/22 1008 05/31/22 1217  NA 142 140 139  K 4.6 4.7 4.8  CL 105 103 105  CO2 31 24 29   GLUCOSE 88 99 122*  BUN 12 19 9   CREATININE 1.07* 0.81 0.92  CALCIUM 9.7 9.8 9.3  GFRNONAA >60  --  >60      Latest Ref Rng & Units 05/31/2022   12:17 PM 04/02/2022   10:08 AM 03/04/2022    3:06 PM  CMP  Glucose 70 - 99 mg/dL 161  99  88    BUN 6 - 20 mg/dL 9  19  12    Creatinine 0.44 - 1.00 mg/dL 0.96  0.45  4.09   Sodium 135 - 145 mmol/L 139  140  142   Potassium 3.5 - 5.1 mmol/L 4.8  4.7  4.6   Chloride 98 - 111 mmol/L 105  103  105   CO2 22 - 32 mmol/L 29  24  31    Calcium 8.9 - 10.3 mg/dL 9.3  9.8  9.7   Total Protein 6.5 - 8.1 g/dL   6.9   Total Bilirubin 0.3 - 1.2 mg/dL   0.5   Alkaline Phos 38 - 126 U/L   58   AST 15 - 41 U/L   14   ALT 0 - 44 U/L   13       Latest Ref Rng & Units 05/31/2022   12:17 PM 04/02/2022   10:08 AM 03/04/2022    3:06 PM  CBC  WBC 4.0 - 10.5 K/uL 4.0  5.1  5.8   Hemoglobin 12.0 - 15.0 g/dL 81.1  91.4  78.2   Hematocrit 36.0 - 46.0 % 39.9  41.5  40.5   Platelets 150 - 400 K/uL 173  209  226    Lipid Panel Recent Labs    08/23/21 0805 02/04/22 0000  CHOL 173 222*  TRIG 181* 170*  LDLCALC 93 142  HDL 49 49     HEMOGLOBIN A1C Lab Results  Component Value Date   HGBA1C 5.8 02/04/2022   MPG 126 12/18/2020   TSH Recent Labs    08/23/21 0805 09/04/21 1457 02/04/22 0000  TSH 4.850* 3.63 0.85   Medications   Current Outpatient Medications:    Aspirin-Caffeine (BAYER BACK & BODY) 500-32.5 MG TABS, Take 2 tablets by mouth daily as needed (Pain)., Disp: , Rfl:    diazepam (VALIUM) 10 MG tablet, Take 1 tablet (10 mg total) by mouth every 12 (twelve) hours as needed for anxiety., Disp: 30 tablet, Rfl: 1   EPINEPHrine 0.3 mg/0.3 mL IJ SOAJ injection, Use as directed for life threatening allergic reactions only (Patient taking differently: Inject 0.3 mg into the muscle as needed for anaphylaxis. Use as directed for life threatening allergic reactions only), Disp: 2 each, Rfl: 3   esomeprazole (NEXIUM) 40 MG capsule, TAKE 1 CAPSULE (40 MG TOTAL) BY MOUTH DAILY AT 12 NOON.,  Disp: 90 capsule, Rfl: 3   furosemide (LASIX) 40 MG tablet, TAKE 1 TABLET BY MOUTH EVERY DAY AS NEEDED FOR FLUID *SHORTNESS OF BREATH*, Disp: 30 tablet, Rfl: 1   ibuprofen (MOTRIN IB) 200 MG tablet, Take 400 mg by  mouth every 6 (six) hours as needed for mild pain or moderate pain., Disp: , Rfl:    levothyroxine (SYNTHROID) 150 MCG tablet, TAKE 1 TABLET BY MOUTH EVERY DAY, Disp: 90 tablet, Rfl: 2   LORazepam (ATIVAN) 1 MG tablet, TAKE 1 TABLET BY MOUTH TWICE A DAY AS NEEDED, Disp: 60 tablet, Rfl: 5   meloxicam (MOBIC) 15 MG tablet, Take 15 mg by mouth daily., Disp: , Rfl:    metoprolol succinate (TOPROL-XL) 25 MG 24 hr tablet, Take 1 tablet (25 mg total) by mouth daily. Take with or immediately following a meal., Disp: 90 tablet, Rfl: 1   montelukast (SINGULAIR) 10 MG tablet, TAKE ONE TABLET BY MOUTH DAILY, Disp: 90 tablet, Rfl: 3   sacubitril-valsartan (ENTRESTO) 24-26 MG, Take 1 tablet by mouth 2 (two) times daily., Disp: 180 tablet, Rfl: 3   testosterone cypionate (DEPO-TESTOSTERONE) 200 MG/ML injection, Inject 200 mg into the muscle every 3 (three) months., Disp: , Rfl:    tirzepatide (MOUNJARO) 15 MG/0.5ML Pen, Inject 15 mg into the skin once a week., Disp: 2 mL, Rfl: 6   VAGIFEM 10 MCG TABS vaginal tablet, Place 1 tablet vaginally 2 (two) times a week., Disp: , Rfl:    venlafaxine XR (EFFEXOR-XR) 75 MG 24 hr capsule, Take 1 capsule (75 mg total) by mouth daily with breakfast., Disp: 90 capsule, Rfl: 0    Cardiac Studies:   Coronary CTA 01/01/2019: 1. Coronary artery calcium score 50 Agatston units. This places the patient in the 97th percentile for age and gender, suggesting high risk for future cardiac events. 2.  Nonobstructive coronary disease. 3. The RCA originates from the left cusp in close proximity to the left coronary and courses interarterially between the aortic root and proximal PA to reach typical RCA territory. Would consider treadmill stress testing to assess for provocative ischemia. 4. LAD system: Mixed plaque in the proximal LAD with mild (<50%) stenosis. Circumflex system: Large PLOM, small AV LCx after take-off of PLOM. No plaque or stenosis.  Echocardiogram 01/02/2022: Severely  depressed LV systolic function with visual EF 25-30%. Left ventricle cavity is minimally dilated. Dilated cardiomyopathy. Hypokinetic global wall motion. Abnormal septal wall motion due to left bundle branch block. Doppler evidence of grade I (impaired) diastolic dysfunction, normal LAP. Left atrial cavity is mildly dilated. Structurally normal tricuspid valve with trace regurgitation. Mild pulmonary hypertension. RVSP measures 37 mmHg. Compared to 5/20232, no significant changes.    CRTD Medtronic COBALT XT CRT D 04/05/2022    Remote CRT-D transmission 07/09/2022: Longevity 9 years and 7 months.  AP <1%.  CRT 99.56%.  Lead impedance and thresholds are normal. Patient activity level. Thoracic impedance suggest ongoing fluid overload state starting middle of February 2024 and ongoing.  Overall patient asymptomatic upon telephone call, probably initializing and will continue to trend.   EKG:  EKG 07/16/2022: Atrially sensed and biventricular paced rhythm at rate of 89 bpm.  No further analysis.  Compared to 1123, left bundle branch block not present  Assessment     ICD-10-CM   1. 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 Syncope due to orthostatic hypotension  I95.1     4. CRTD Medtronic COBALT XT CRT D 04/05/2022  Z95.810       Meds ordered this encounter  Medications   DISCONTD: carvedilol (COREG) 3.125 MG tablet    Sig: Take 1 tablet (3.125 mg total) by mouth 2 (two) times daily with a meal.    Dispense:  180 tablet    Refill:  3   sacubitril-valsartan (ENTRESTO) 24-26 MG    Sig: Take 1 tablet by mouth 2 (two) times daily.    Dispense:  180 tablet    Refill:  3   metoprolol succinate (TOPROL-XL) 25 MG 24 hr tablet    Sig:  Take 1 tablet (25 mg total) by mouth daily. Take with or immediately following a meal.    Dispense:  90 tablet    Refill:  1    Do not fill Coreg    Medications Discontinued During This Encounter  Medication Reason   oxyCODONE-acetaminophen (PERCOCET/ROXICET) 5-325 MG tablet Patient Preference   pregabalin (LYRICA) 75 MG capsule Patient Preference   rosuvastatin (CRESTOR) 20 MG tablet Patient Preference   0.9 %  sodium chloride infusion    ENTRESTO 97-103 MG Side effect (s)   carvedilol (COREG) 12.5 MG tablet Dose change   carvedilol (COREG) 3.125 MG tablet Change in therapy     Orders Placed This Encounter  Procedures   EKG 12-Lead   PCV ECHOCARDIOGRAM COMPLETE    Standing Status:   Future    Standing Expiration Date:   07/16/2023   Recommendations:   Rebecca [name]  is a 52 y.o. Caucasian female initially with history of LBBB at least dating back to 2013.   Past medical history includes hyperthyroidism S/P RAI therapy in March 2018, depression, DM, estrogen negative right breast cancer S/P Bilateral mastectomy and adjuvant chemotherapy(Adriamycin and Cytoxan x4)  and also breast reconstruction surgery on 11/18/2017, NICM with severe LV systolic dysfunction S/P BV ICD implantation with Medtronic CRT-D on 04/05/2022.  Patient presents for a 3 month follow-up visit, overall since dietary changes and also starting [facility], she has lost 50-60 Lbs in weight.  Since then has been having low blood pressure and near syncopal spells.  1. Chronic systolic heart failure (HCC) Patient needs repeat echocardiogram to follow-up on her LVEF since she now has CRT-D and also her QRS is clearly narrowed on the EKG suggesting excellent BiV capture.  No clinical evidence of heart failure.  She has very low blood pressure, advised her to keep gentle hydration throughout the day and I have discontinued carvedilol and will switch her to metoprolol succinate 25 mg daily and will further reduce Entresto to  the minimum dose of 24/26 mg twice daily.  2. 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  She also has elevated coronary calcium score >90th percentile, she needs follow-up lipid profile testing during her annual visit with Dr. Lenord Fellers.  3. Syncope due to orthostatic hypotension Patient previously has had syncope due to orthostatic hypotension, hopefully by reducing the dose of the medication she will continue to feel better.  4. CRTD Medtronic COBALT XT CRT D 04/05/2022 CRT-D is initializing, will continue to monitor her on a monthly basis for fluid overload state change and also for any arrhythmias.   Yates Decamp, MD, [facility] 07/16/2022, 11:04 AM Office: [phone]

## 2022-07-26 ENCOUNTER — Ambulatory Visit: Payer: 59 | Attending: Internal Medicine | Admitting: Internal Medicine

## 2022-07-26 ENCOUNTER — Encounter: Payer: Self-pay | Admitting: Internal Medicine

## 2022-07-26 VITALS — BP 118/70 | HR 93 | Ht 69.0 in | Wt 165.2 lb

## 2022-07-26 DIAGNOSIS — I10 Essential (primary) hypertension: Secondary | ICD-10-CM | POA: Diagnosis not present

## 2022-07-26 DIAGNOSIS — Z95810 Presence of automatic (implantable) cardiac defibrillator: Secondary | ICD-10-CM | POA: Diagnosis not present

## 2022-07-26 DIAGNOSIS — I5022 Chronic systolic (congestive) heart failure: Secondary | ICD-10-CM | POA: Diagnosis not present

## 2022-07-26 LAB — CUP PACEART INCLINIC DEVICE CHECK
Date Time Interrogation Session: 20240329145601
Implantable Lead Connection Status: 753985
Implantable Lead Connection Status: 753985
Implantable Lead Connection Status: 753985
Implantable Lead Implant Date: 20231208
Implantable Lead Implant Date: 20231208
Implantable Lead Implant Date: 20231208
Implantable Lead Location: 753858
Implantable Lead Location: 753859
Implantable Lead Location: 753860
Implantable Lead Model: 5076
Implantable Lead Model: 6935
Implantable Pulse Generator Implant Date: 20231208

## 2022-07-26 NOTE — Progress Notes (Signed)
HPI Mrs. Newey returns today for ongoing evaluation, s/p  ICD insertion. She is a pleasant 52 yo woman with a long h/o LBBB who then was found to have breast CA and was treated with chemotherapy and then developed worsening LV dysfunction and progressively worse CHF despite being treated with GDMT. She has not had syncope. She has a h/o obesity but has been losing weight. She has class 3 CHF symptoms. She has an EF of 25% by echo. She admits to some medical non-compliance remotely but none recently.   Since her device insertion she has had some problems with hypotension and had her meds adjusted.  No Known Allergies   Current Outpatient Medications  Medication Sig Dispense Refill   Aspirin-Caffeine (BAYER BACK & BODY) 500-32.5 MG TABS Take 2 tablets by mouth daily as needed (Pain).     diazepam (VALIUM) 10 MG tablet Take 1 tablet (10 mg total) by mouth every 12 (twelve) hours as needed for anxiety. 30 tablet 1   EPINEPHrine 0.3 mg/0.3 mL IJ SOAJ injection Use as directed for life threatening allergic reactions only (Patient taking differently: Inject 0.3 mg into the muscle as needed for anaphylaxis. Use as directed for life threatening allergic reactions only) 2 each 3   esomeprazole (NEXIUM) 40 MG capsule TAKE 1 CAPSULE (40 MG TOTAL) BY MOUTH DAILY AT 12 NOON. 90 capsule 3   furosemide (LASIX) 40 MG tablet TAKE 1 TABLET BY MOUTH EVERY DAY AS NEEDED FOR FLUID *SHORTNESS OF BREATH* 30 tablet 1   ibuprofen (MOTRIN IB) 200 MG tablet Take 400 mg by mouth every 6 (six) hours as needed for mild pain or moderate pain.     levothyroxine (SYNTHROID) 150 MCG tablet TAKE 1 TABLET BY MOUTH EVERY DAY 90 tablet 2   LORazepam (ATIVAN) 1 MG tablet TAKE 1 TABLET BY MOUTH TWICE A DAY AS NEEDED 60 tablet 5   meloxicam (MOBIC) 15 MG tablet Take 15 mg by mouth daily.     montelukast (SINGULAIR) 10 MG tablet TAKE ONE TABLET BY MOUTH DAILY 90 tablet 3   sacubitril-valsartan (ENTRESTO) 24-26 MG Take 1 tablet  by mouth 2 (two) times daily. 180 tablet 3   testosterone cypionate (DEPO-TESTOSTERONE) 200 MG/ML injection Inject 200 mg into the muscle every 3 (three) months.     tirzepatide (MOUNJARO) 15 MG/0.5ML Pen Inject 15 mg into the skin once a week. 2 mL 6   VAGIFEM 10 MCG TABS vaginal tablet Place 1 tablet vaginally 2 (two) times a week.     venlafaxine XR (EFFEXOR-XR) 75 MG 24 hr capsule Take 1 capsule (75 mg total) by mouth daily with breakfast. 90 capsule 0   metoprolol succinate (TOPROL-XL) 25 MG 24 hr tablet Take 1 tablet (25 mg total) by mouth daily. Take with or immediately following a meal. (Patient not taking: Reported on 07/26/2022) 90 tablet 1   No current facility-administered medications for this visit.     Past Medical History:  Diagnosis Date   Acute respiratory disease due to COVID-19 virus 12/25/2019   Allergy    constant hives post chemo therapy.  All allergen tests have been neg.   Anxiety    Breast cancer ([place])    Coronary artery disease    CRTD Medtronic COBALT XT CRT D 04/05/2022 04/05/2022   Diabetes mellitus without complication ([place])    Diverticulitis    Encounter for assessment of implantable cardioverter-defibrillator (ICD) 04/06/2022   GERD (gastroesophageal reflux disease)    Heart failure (  [place]) 01/02/2022   EF 25 to 30%   Hiatal hernia    Hyperlipidemia    Hypertension    Hypothyroidism    LBBB (left bundle branch block)    Pneumonia     ROS:   All systems reviewed and negative except as noted in the HPI.   Past Surgical History:  Procedure Laterality Date   BIV ICD INSERTION CRT-D N/A 04/05/2022   Procedure: BIV ICD INSERTION CRT-D;  Surgeon: Evans Lance, MD;  Location: [place] [facility];  Service: Cardiovascular;  Laterality: N/A;   BREAST RECONSTRUCTION WITH PLACEMENT OF TISSUE EXPANDER AND ALLODERM Bilateral 11/18/2017   Procedure: BILATERAL BREAST RECONSTRUCTION WITH PLACEMENT OF TISSUE EXPANDER AND ALLODERM;  Surgeon: Irene Limbo,  MD;  Location: [facility];  Service: Plastics;  Laterality: Bilateral;   CESAREAN SECTION  2007/2010   x2   COLONOSCOPY  2017   COLONOSCOPY WITH PROPOFOL N/A 03/19/2022   Procedure: COLONOSCOPY WITH PROPOFOL;  Surgeon: Jerene Bears, MD;  Location: [facility];  Service: Gastroenterology;  Laterality: N/A;   LAPAROSCOPIC TUBAL LIGATION  06/12/2011   Procedure: LAPAROSCOPIC TUBAL LIGATION;  Surgeon: Daria Pastures, MD;  Location: [facility];  Service: Gynecology;  Laterality: N/A;  filshie clip   LIPOSUCTION WITH LIPOFILLING Bilateral 02/24/2018   Procedure: LIPOFILLING FROM ABDOMEN TO BILATERAL CHEST;  Surgeon: Irene Limbo, MD;  Location: [facility];  Service: Plastics;  Laterality: Bilateral;   LUMBAR LAMINECTOMY/ DECOMPRESSION WITH MET-RX Left 02/08/2022   Procedure: Left Lumbar Five-Sacral One Microdiscectomy with Metrex;  Surgeon: Kristeen Miss, MD;  Location: [facility];  Service: Neurosurgery;  Laterality: Left;  3C/RM 20   LUMBAR LAMINECTOMY/ DECOMPRESSION WITH MET-RX Left 05/31/2022   Procedure: Left Lumbar Five-Sacral One Microdiscectomy;  Surgeon: Kristeen Miss, MD;  Location: [facility];  Service: Neurosurgery;  Laterality: Left;  Patient being admitted after having myelogram/ct this morning   MASTECTOMY W/ SENTINEL NODE BIOPSY Bilateral 11/18/2017   Procedure: BILATERAL TOTAL MASTECTOMIES WITH RIGHT SENTINEL LYMPH NODE BIOPSY;  Surgeon: Rolm Bookbinder, MD;  Location: [place];  Service: General;  Laterality: Bilateral;   mini tuck  2012   abdominal   PORT-A-CATH REMOVAL Right 01/05/2019   PORTACATH PLACEMENT Right 07/15/2017   Procedure: INSERTION PORT-A-CATH WITH ULTRASOUND;  Surgeon: Rolm Bookbinder, MD;  Location: [place];  Service: General;  Laterality: Right;   REMOVAL OF BILATERAL TISSUE EXPANDERS WITH PLACEMENT OF BILATERAL BREAST IMPLANTS Bilateral 02/24/2018   Procedure: REMOVAL OF BILATERAL TISSUE EXPANDERS  WITH PLACEMENT OF BILATERAL BREAST IMPLANTS;  Surgeon: Irene Limbo, MD;  Location: [facility];  Service: Plastics;  Laterality: Bilateral;   right breast cancer     upper-outer quadrant    right ear surg  03/2008   Mohs   TUBAL LIGATION     US ECHOCARDIOGRAPHY  12-30-2007   EF 55-60%   WISDOM TOOTH EXTRACTION       Family History  Problem Relation Age of Onset   Lymphoma Father    Cancer Father        lymphoma   Diabetes Brother    Diabetes Mother    Liver disease Mother        NASH, d. 16   Asthma Mother    CAD Maternal Grandmother    CAD Maternal Grandfather    Stomach cancer Neg Hx    Colon cancer Neg Hx    Colon polyps Neg Hx    Esophageal cancer Neg Hx  Rectal cancer Neg Hx      Social History   Socioeconomic History   Marital status: Married    Spouse name: Not on file   Number of children: 2   Years of education: Not on file   Highest education level: Not on file  Occupational History   Occupation: [employer] fast [employer]  Tobacco Use   Smoking status: Former    Packs/day: 0.25    Years: 6.00    Additional pack years: 0.00    Total pack years: 1.50    Types: Cigarettes    Quit date: 03/29/2005    Years since quitting: 17.3   Smokeless tobacco: Never  Vaping Use   Vaping Use: Never used  Substance and Sexual Activity   Alcohol use: Yes    Comment: socially   Drug use: No   Sexual activity: Yes    Birth control/protection: Surgical    Comment: BTL  Other Topics Concern   Not on file  Social History Narrative   ** Merged History Encounter **       Social Determinants of Health   Financial Resource Strain: Not on file  Food Insecurity: Not on file  Transportation Needs: Not on file  Physical Activity: Not on file  Stress: Not on file  Social Connections: Not on file  Intimate Partner Violence: Not on file     BP 118/70   Pulse 93   Ht 5\' 9"  (1.753 m)   Wt 165 lb 3.2 oz (74.9 kg)   LMP  (LMP Unknown)   SpO2 96%   BMI 24.40  kg/m   Physical Exam:  Well appearing NAD HEENT: Unremarkable Neck:  No JVD, no thyromegally Lymphatics:  No adenopathy Back:  No CVA tenderness Lungs:  Clear with no wheezes HEART:  Regular rate rhythm, no murmurs, no rubs, no clicks Abd:  soft, positive bowel sounds, no organomegally, no rebound, no guarding Ext:  2 plus pulses, no edema, no cyanosis, no clubbing Skin:  No rashes no nodules Neuro:  CN II through XII intact, motor grossly intact  DEVICE  Normal device function.  See PaceArt for details.   Assess/Plan:  Chronic systolic heart failure - she is s/p ICD insertion and is doing well. No change in her management. ICD - her Medtronic Biv ICD is working normally. No change.  Royston Sinner Adine Heimann,MD

## 2022-07-26 NOTE — Patient Instructions (Addendum)
Medication Instructions:  Your physician recommends that you continue on your current medications as directed. Please refer to the Current Medication list given to you today.  *If you need a refill on your cardiac medications before your next appointment, please call your pharmacy*  Lab Work: None ordered.  If you have labs (blood work) drawn today and your tests are completely normal, you will receive your results only by: [place] (if you have MyChart) OR A paper copy in the mail If you have any lab test that is abnormal or we need to change your treatment, we will call you to review the results.  Testing/Procedures: None ordered.  Follow-Up: At [facility], you and your health needs are our priority.  As part of our continuing mission to provide you with exceptional heart care, we have created designated Provider Care Teams.  These Care Teams include your primary Cardiologist (physician) and Advanced Practice Providers (APPs -  Physician Assistants and Nurse Practitioners) who all work together to provide you with the care you need, when you need it.   Your next appointment:   1 year(s)  The format for your next appointment:   In Person  Provider:   Cristopher Peru, MD{or one of the following Advanced Practice Providers on your designated Care Team:   Tommye Standard, [place] Legrand Como "[facility]" [place], [place]

## 2022-08-12 ENCOUNTER — Other Ambulatory Visit: Payer: Self-pay

## 2022-08-12 ENCOUNTER — Encounter: Payer: Self-pay | Admitting: Internal Medicine

## 2022-08-12 MED ORDER — LEVOTHYROXINE SODIUM 150 MCG PO TABS: 150.0000 ug | ORAL_TABLET | Freq: Every day | ORAL | 2 refills | Status: DC

## 2022-08-21 ENCOUNTER — Other Ambulatory Visit: Payer: Self-pay | Admitting: Internal Medicine

## 2022-09-04 ENCOUNTER — Other Ambulatory Visit: Payer: 59

## 2022-09-12 ENCOUNTER — Ambulatory Visit
Admission: EM | Admit: 2022-09-12 | Discharge: 2022-09-12 | Disposition: A | Discharge status: Other Acute Inpatient Hospital | Payer: 59 | Attending: Internal Medicine | Admitting: Internal Medicine

## 2022-09-12 ENCOUNTER — Telehealth: Payer: Self-pay

## 2022-09-12 DIAGNOSIS — H532 Diplopia: Secondary | ICD-10-CM

## 2022-09-12 LAB — POCT FASTING CBG KUC MANUAL ENTRY: POCT Glucose (KUC): 124 mg/dL — AB (ref 70–99)

## 2022-09-12 NOTE — Discharge Instructions (Signed)
I would like for you to go to the nearest ER for further evaluation and management of your double vision.

## 2022-09-12 NOTE — ED Notes (Signed)
Patient is being discharged from the Urgent Care and sent to the Emergency Department via pov . Per [place] NP, patient is in need of higher level of care due to symptoms. Patient is aware and verbalizes understanding of plan of care.  Vitals:   09/12/22 1238  BP: 111/78  Pulse: 76  Resp: 16  Temp: 97.7 F (36.5 C)  SpO2: 98%

## 2022-09-12 NOTE — Telephone Encounter (Signed)
Patient called stating she has been having double vision for the last 2-3 days and believes it could be her thyroid levels and would like to schedule an lab appt I informed patient she may require an office visit and I will send a message and we will call her back to schedule

## 2022-09-12 NOTE — ED Provider Notes (Signed)
[facility]    CSN: [id number] Arrival date & time: 09/12/22  1222      History   Chief Complaint Chief Complaint  Patient presents with   Diplopia    HPI Rebecca [name] is a 52 y.o. female.   Patient with history of type 2 diabetes, breast cancer in 2020 (in remission), HTN, HLD, Hypothyroidism, and LBBB (ICD present) presents to urgent care for evaluation of diplopia that started 2 days ago. States this has happened in the past and was diagnosed with "thyroid issues"/placed on synthroid.  Diplopia started gradually and is intermittent. States whenever she uses both eyes to see, vision is intermittently double. However, when she covers one eye or the other, she is able to see normally. She is not having any headaches in the last 3 days since symptoms started. Has headaches intermittently but attributes this to allergies and states they resolve with use of OTC medications as needed.  Wears glasses for vision correction and states wearing glasses does not improve or worsen diplopia. Last eye exam was within the last year. No recent head injuries, confusion, memory changes, dizziness, nausea, vomiting, abdominal pain, rash, recent viral URI symptoms, or use of blood thinners. She has not been able to identify a trigger for diplopia but suspects this may be related to either stress or worsening thyroid problems. Takes 125mg  synthroid daily without missed doses or recent changes in doses.  Fasting sugars have been normal around upper 90s-low 100s. Eating and drinking normally without difficulty. Admits to not drinking quite as much water as she maybe should. Takes metoprolol daily. No heart palpitations, chest pain, or shortness of breath. No recent fevers.      Past Medical History:  Diagnosis Date   Acute respiratory disease due to COVID-19 virus 12/25/2019   Allergy    constant hives post chemo therapy.  All allergen tests have been neg.   Anxiety    Breast cancer  (HCC)    Coronary artery disease    CRTD Medtronic COBALT XT CRT D 04/05/2022 04/05/2022   Diabetes mellitus without complication (HCC)    Diverticulitis    Encounter for assessment of implantable cardioverter-defibrillator (ICD) 04/06/2022   GERD (gastroesophageal reflux disease)    Heart failure (HCC) 01/02/2022   EF 25 to 30%   Hiatal hernia    Hyperlipidemia    Hypertension    Hypothyroidism    LBBB (left bundle branch block)    Pneumonia     Patient Active Problem List   Diagnosis Date Noted   Herniated nucleus pulposus, L5-S1 05/31/2022   Encounter for assessment of implantable cardioverter-defibrillator (ICD) 04/06/2022   ICD (implantable cardioverter-defibrillator) in place 04/05/2022   Chronic systolic heart failure (HCC) 04/02/2022   Hypertension 04/02/2022   Abnormal CT scan, colon 03/19/2022   Herniated nucleus pulposus, lumbar 02/08/2022   Diabetes mellitus type 2 in obese 12/25/2019   Abdominal pain, epigastric 10/01/2019   Breast cancer, right (HCC) 11/18/2017   Genetic testing 08/19/2017   Port-A-Cath in place 08/08/2017   Malignant neoplasm of upper-outer quadrant of right breast in female, estrogen receptor negative (HCC) 07/14/2017   Dependent edema 10/23/2015   Episodic paroxysmal anxiety disorder 10/23/2015   Attention deficit disorder 10/23/2015   Low TSH level 10/23/2015   Obesity 10/23/2015   History of abdominoplasty 10/23/2015   Chest pain 09/09/2011    Past Surgical History:  Procedure Laterality Date   BIV ICD INSERTION CRT-D N/A 04/05/2022   Procedure: BIV ICD INSERTION  CRT-D;  Surgeon: Marinus Maw, MD;  Location: [facility];  Service: Cardiovascular;  Laterality: N/A;   BREAST RECONSTRUCTION WITH PLACEMENT OF TISSUE EXPANDER AND ALLODERM Bilateral 11/18/2017   Procedure: BILATERAL BREAST RECONSTRUCTION WITH PLACEMENT OF TISSUE EXPANDER AND ALLODERM;  Surgeon: Glenna Fellows, MD;  Location: [facility];  Service:  Plastics;  Laterality: Bilateral;   CESAREAN SECTION  2007/2010   x2   COLONOSCOPY  2017   COLONOSCOPY WITH PROPOFOL N/A 03/19/2022   Procedure: COLONOSCOPY WITH PROPOFOL;  Surgeon: Beverley Fiedler, MD;  Location: [facility];  Service: Gastroenterology;  Laterality: N/A;   LAPAROSCOPIC TUBAL LIGATION  06/12/2011   Procedure: LAPAROSCOPIC TUBAL LIGATION;  Surgeon: Loney Laurence, MD;  Location: [facility];  Service: Gynecology;  Laterality: N/A;  filshie clip   LIPOSUCTION WITH LIPOFILLING Bilateral 02/24/2018   Procedure: LIPOFILLING FROM ABDOMEN TO BILATERAL CHEST;  Surgeon: Glenna Fellows, MD;  Location: [facility];  Service: Plastics;  Laterality: Bilateral;   LUMBAR LAMINECTOMY/ DECOMPRESSION WITH MET-RX Left 02/08/2022   Procedure: Left Lumbar Five-Sacral One Microdiscectomy with Metrex;  Surgeon: Barnett Abu, MD;  Location: [facility];  Service: Neurosurgery;  Laterality: Left;  3C/RM 20   LUMBAR LAMINECTOMY/ DECOMPRESSION WITH MET-RX Left 05/31/2022   Procedure: Left Lumbar Five-Sacral One Microdiscectomy;  Surgeon: Barnett Abu, MD;  Location: [facility];  Service: Neurosurgery;  Laterality: Left;  Patient being admitted after having myelogram/ct this morning   MASTECTOMY W/ SENTINEL NODE BIOPSY Bilateral 11/18/2017   Procedure: BILATERAL TOTAL MASTECTOMIES WITH RIGHT SENTINEL LYMPH NODE BIOPSY;  Surgeon: Emelia Loron, MD;  Location: [facility];  Service: General;  Laterality: Bilateral;   mini tuck  2012   abdominal   PORT-A-CATH REMOVAL Right 01/05/2019   PORTACATH PLACEMENT Right 07/15/2017   Procedure: INSERTION PORT-A-CATH WITH ULTRASOUND;  Surgeon: Emelia Loron, MD;  Location: [facility];  Service: General;  Laterality: Right;   REMOVAL OF BILATERAL TISSUE EXPANDERS WITH PLACEMENT OF BILATERAL BREAST IMPLANTS Bilateral 02/24/2018   Procedure: REMOVAL OF BILATERAL TISSUE EXPANDERS WITH PLACEMENT OF BILATERAL BREAST IMPLANTS;  Surgeon:  Glenna Fellows, MD;  Location: [facility];  Service: Plastics;  Laterality: Bilateral;   right breast cancer     upper-outer quadrant    right ear surg  03/2008   Mohs   TUBAL LIGATION     US ECHOCARDIOGRAPHY  12-30-2007   EF 55-60%   WISDOM TOOTH EXTRACTION      OB History   No obstetric history on file.      Home Medications    Prior to Admission medications   Medication Sig Start Date End Date Taking? Authorizing Provider  Aspirin-Caffeine (BAYER BACK & BODY) 500-32.5 MG TABS Take 2 tablets by mouth daily as needed (Pain).    [provider]  diazepam (VALIUM) 10 MG tablet Take 1 tablet (10 mg total) by mouth every 12 (twelve) hours as needed for anxiety. Patient not taking: Reported on 09/12/2022 06/03/22   Margaree Mackintosh, MD  EPINEPHrine 0.3 mg/0.3 mL IJ SOAJ injection Use as directed for life threatening allergic reactions only Patient taking differently: Inject 0.3 mg into the muscle as needed for anaphylaxis. Use as directed for life threatening allergic reactions only 07/26/19   Kozlow, Alvira Philips, MD  esomeprazole (NEXIUM) 40 MG capsule TAKE 1 CAPSULE (40 MG TOTAL) BY MOUTH DAILY AT 12 NOON. 07/15/22   Serena Croissant, MD  furosemide (LASIX) 40 MG tablet TAKE 1 TABLET BY  MOUTH EVERY DAY AS NEEDED FOR FLUID *SHORTNESS OF BREATH* 05/08/22   Yates Decamp, MD  ibuprofen (MOTRIN IB) 200 MG tablet Take 400 mg by mouth every 6 (six) hours as needed for mild pain or moderate pain.    [provider]  levothyroxine (SYNTHROID) 150 MCG tablet Take 1 tablet (150 mcg total) by mouth daily. Patient taking differently: Take 125 mcg by mouth daily. 08/12/22   Margaree Mackintosh, MD  LORazepam (ATIVAN) 1 MG tablet TAKE 1 TABLET BY MOUTH TWICE A DAY AS NEEDED 07/09/22   Margaree Mackintosh, MD  meloxicam (MOBIC) 15 MG tablet Take 15 mg by mouth daily. 02/06/22   [provider]  metoprolol succinate (TOPROL-XL) 25 MG 24 hr tablet Take 1 tablet (25 mg total) by mouth daily.  Take with or immediately following a meal. Patient not taking: Reported on 07/26/2022 07/16/22 01/12/23  Yates Decamp, MD  montelukast (SINGULAIR) 10 MG tablet TAKE ONE TABLET BY MOUTH DAILY 05/07/22   Margaree Mackintosh, MD  sacubitril-valsartan (ENTRESTO) 24-26 MG Take 1 tablet by mouth 2 (two) times daily. 07/16/22   Yates Decamp, MD  testosterone cypionate (DEPO-TESTOSTERONE) 200 MG/ML injection Inject 200 mg into the muscle every 3 (three) months. 02/04/22   [provider]  tirzepatide Greggory Keen) 15 MG/0.5ML Pen Inject 15 mg into the skin once a week. 05/15/22   Yates Decamp, MD  VAGIFEM 10 MCG TABS vaginal tablet Place 1 tablet vaginally 2 (two) times a week. 02/06/22   [provider]  venlafaxine XR (EFFEXOR-XR) 75 MG 24 hr capsule Take 1 capsule (75 mg total) by mouth daily with breakfast. 08/21/22   Baxley, Luanna Cole, MD    Family History Family History  Problem Relation Age of Onset   Lymphoma Father    Cancer Father        lymphoma   Diabetes Brother    Diabetes Mother    Liver disease Mother        NASH, d. 86   Asthma Mother    CAD Maternal Grandmother    CAD Maternal Grandfather    Stomach cancer Neg Hx    Colon cancer Neg Hx    Colon polyps Neg Hx    Esophageal cancer Neg Hx    Rectal cancer Neg Hx     Social History Social History   Tobacco Use   Smoking status: Former    Packs/day: 0.25    Years: 6.00    Additional pack years: 0.00    Total pack years: 1.50    Types: Cigarettes    Quit date: 03/29/2005    Years since quitting: 17.4   Smokeless tobacco: Never  Vaping Use   Vaping Use: Never used  Substance Use Topics   Alcohol use: Yes    Comment: socially   Drug use: No     Allergies   Patient has no known allergies.   Review of Systems Review of Systems Per HPI  Physical Exam Triage Vital Signs ED Triage Vitals  Enc Vitals Group     BP 09/12/22 1238 111/78     Pulse Rate 09/12/22 1238 76     Resp 09/12/22 1238 16     Temp 09/12/22 1238  97.7 F (36.5 C)     Temp src --      SpO2 09/12/22 1238 98 %     Weight --      Height --      Head Circumference --  Peak Flow --      Pain Score 09/12/22 1236 0     Pain Loc --      Pain Edu? --      Excl. in GC? --    No data found.  Updated Vital Signs BP 111/78   Pulse 76   Temp 97.7 F (36.5 C)   Resp 16   LMP  (LMP Unknown)   SpO2 98%   Visual Acuity Right Eye Distance:   Left Eye Distance:   Bilateral Distance:    Right Eye Near:   Left Eye Near:    Bilateral Near:     Physical Exam Vitals and nursing note reviewed.  Constitutional:      Appearance: She is not ill-appearing or toxic-appearing.  HENT:     Head: Normocephalic and atraumatic.     Right Ear: Hearing, tympanic membrane, ear canal and external ear normal.     Left Ear: Hearing, tympanic membrane, ear canal and external ear normal.     Nose: Nose normal.     Mouth/Throat:     Lips: Pink.  Eyes:     General: Lids are normal. Vision grossly intact. Gaze aligned appropriately. No visual field deficit.       Right eye: No discharge.        Left eye: No discharge.     Extraocular Movements: Extraocular movements intact.     Conjunctiva/sclera: Conjunctivae normal.     Right eye: Right conjunctiva is not injected.     Left eye: Left conjunctiva is not injected.     Pupils: Pupils are equal, round, and reactive to light.     Comments: EOMs intact without pain or dizziness elicited.   Cardiovascular:     Rate and Rhythm: Normal rate and regular rhythm.     Heart sounds: Normal heart sounds, S1 normal and S2 normal.  Pulmonary:     Effort: Pulmonary effort is normal. No respiratory distress.     Breath sounds: Normal breath sounds and air entry. No stridor. No wheezing, rhonchi or rales.  Chest:     Chest wall: No tenderness.  Musculoskeletal:     Cervical back: Neck supple.  Lymphadenopathy:     Cervical: No cervical adenopathy.  Skin:    General: Skin is warm and dry.     Capillary  Refill: Capillary refill takes less than 2 seconds.     Findings: No rash.  Neurological:     General: No focal deficit present.     Mental Status: She is alert and oriented to person, place, and time. Mental status is at baseline.     Cranial Nerves: No cranial nerve deficit, dysarthria or facial asymmetry.     Sensory: No sensory deficit.     Motor: No weakness.     Coordination: Coordination is intact. Coordination normal.     Gait: Gait is intact. Gait normal.     Comments: Nonfocal neurologic exam.  Strength and sensation intact to extremities.  Moves all 4 extremities with normal coordination voluntarily.  Psychiatric:        Mood and Affect: Mood normal.        Speech: Speech normal.        Behavior: Behavior normal.        Thought Content: Thought content normal.        Judgment: Judgment normal.      UC Treatments / Results  Labs (all labs ordered are listed, but only abnormal results are displayed)  Labs Reviewed  POCT FASTING CBG [facility] MANUAL ENTRY - Abnormal; Notable for the following components:      Result Value   POCT Glucose ([facility]) 124 (*)    All other components within normal limits    EKG   Radiology No results found.  Procedures Procedures (including critical care time)  Medications Ordered in UC Medications - No data to display  Initial Impression / Assessment and Plan / UC Course  I have reviewed the triage vital signs and the nursing notes.  Pertinent labs & imaging results that were available during my care of the patient were reviewed by me and considered in my medical decision making (see chart for details).   1.  Diplopia Diplopia concerning for intracranial abnormality.  Suspect possible dehydration etiology, however cannot rule out intracranial abnormality with resources available urgent care.  Patient would benefit from workup in the nearest emergency department to rule out emergent cause of diplopia.  Neurologic exam is currently stable.   Vital signs are also hemodynamically stable and patient is overall well in appearance.  Nonfasting CBG is 124 in clinic.  Discussed recommendations with patient who expresses understanding and agreement with plan to proceed to the nearest emergency department for further workup and evaluation.  Discussed risks of deferring ED visit, she verbalized understanding and agreement with plan.  Patient discharged from urgent care to ED in stable condition.  Final Clinical Impressions(s) / UC Diagnoses   Final diagnoses:  Diplopia     Discharge Instructions      I would like for you to go to the nearest ER for further evaluation and management of your double vision.      ED Prescriptions   None    PDMP not reviewed this encounter.   Carlisle Beers, [place] 09/12/22 1432

## 2022-09-12 NOTE — Telephone Encounter (Signed)
scheduled

## 2022-09-12 NOTE — ED Triage Notes (Signed)
Pt presents to [facility] with co of double vision for 2 days. Pt reports no injury or falls. Reports this happened one other time and had thyroid issues she currently takes synthroid since.   Pt does have diabetes. Reports no changes in sugars recently

## 2022-09-16 ENCOUNTER — Ambulatory Visit: Payer: 59 | Admitting: Internal Medicine

## 2022-10-02 ENCOUNTER — Other Ambulatory Visit: Payer: 59

## 2022-10-05 ENCOUNTER — Encounter: Payer: Self-pay | Admitting: Internal Medicine

## 2022-10-07 ENCOUNTER — Encounter: Payer: Self-pay | Admitting: Cardiology

## 2022-10-15 NOTE — Progress Notes (Signed)
Patient Care Team: Margaree Mackintosh, MD as PCP - General (Internal Medicine) Lenord Fellers Luanna Cole, MD (Internal Medicine)  Visit Date: 10/22/22  Subjective:    Patient ID: Rebecca [name] , Female   DOB: June 20, 1970, 52 y.o.    MRN: [id number]   52 y.o. Female presents today for annual comprehensive physical exam.  Vision has improved since ER visit for diplopia on 09/12/22.  Status post left lumbar five-sacral one microdiscectomy 05/31/22. She is not taking pain medication for this. Has been having back pain.   History of allergies treated with montelukast 10 mg daily.   History of heart failure, insertion of cardioverter-defibrillator. Has shortness of breath upon increased activity.  History of GERD treated with esomeprazole 40 mg daily at noon.  History of hyperlipidemia. Currently not taking medication. CHOL elevated at 261. LDL elevated at 175.  History of hypertension. Currently not taking medication. Blood pressure normal today at 108/80.  History of hypothyroidism. Recently started taking levothyroxine 125 mcg daily. TSH elevated at 37.68.   History of obesity treated with tirzepatide 15 mg weekly.  Negative pap smear 02/04/22.  Status post bilateral mastectomies 2019.  Glucose normal. Kidney, liver functions normal. Potassium elevated at 5.5. CBC normal. History of Type 2 diabetes mellitus with HGBA1c well-controlled at 5.5%.  Colonoscopy last completed 03/19/22. Showed moderate diverticulosis in sigmoid, descending colon. Examination otherwise normal. Recommended repeat in 2033.  Past Medical History:  Diagnosis Date   Acute respiratory disease due to COVID-19 virus 12/25/2019   Allergy    constant hives post chemo therapy.  All allergen tests have been neg.   Anxiety    Breast cancer (HCC)    Coronary artery disease    CRTD Medtronic COBALT XT CRT D 04/05/2022 04/05/2022   Diabetes mellitus without complication (HCC)    Diverticulitis    Encounter for  assessment of implantable cardioverter-defibrillator (ICD) 04/06/2022   GERD (gastroesophageal reflux disease)    Heart failure (HCC) 01/02/2022   EF 25 to 30%   Hiatal hernia    Hyperlipidemia    Hypertension    Hypothyroidism    LBBB (left bundle branch block)    Pneumonia      Family History  Problem Relation Age of Onset   Lymphoma Father    Cancer Father        lymphoma   Diabetes Brother    Diabetes Mother    Liver disease Mother        NASH, d. 46   Asthma Mother    CAD Maternal Grandmother    CAD Maternal Grandfather    Stomach cancer Neg Hx    Colon cancer Neg Hx    Colon polyps Neg Hx    Esophageal cancer Neg Hx    Rectal cancer Neg Hx     Social History   Social History Narrative   ** Merged History Encounter **          Review of Systems  Constitutional:  Negative for chills, fever, malaise/fatigue and weight loss.  HENT:  Negative for hearing loss, sinus pain and sore throat.   Respiratory:  Negative for cough, hemoptysis and shortness of breath.   Cardiovascular:  Negative for chest pain, palpitations, leg swelling and PND.  Gastrointestinal:  Negative for abdominal pain, constipation, diarrhea, heartburn, nausea and vomiting.  Genitourinary:  Negative for dysuria, frequency and urgency.  Musculoskeletal:  Positive for back pain. Negative for myalgias and neck pain.  Skin:  Negative for itching and rash.  Neurological:  Negative for dizziness, tingling, seizures and headaches.  Endo/Heme/Allergies:  Negative for polydipsia.  Psychiatric/Behavioral:  Negative for depression. The patient is nervous/anxious.         Objective:   Vitals: BP 108/80   Pulse 77   Temp 98.8 F (37.1 C) (Tympanic)   Resp 16   Ht 5\' 8"  (1.727 m)   Wt 168 lb 8 oz (76.4 kg)   LMP  (LMP Unknown)   SpO2 99%   BMI 25.62 kg/m    Physical Exam Vitals and nursing note reviewed.  Constitutional:      General: She is not in acute distress.    Appearance: Normal  appearance. She is not ill-appearing or toxic-appearing.  HENT:     Head: Normocephalic and atraumatic.     Right Ear: Hearing, tympanic membrane, ear canal and external ear normal.     Left Ear: Hearing, tympanic membrane, ear canal and external ear normal.     Mouth/Throat:     Pharynx: Oropharynx is clear.  Eyes:     Extraocular Movements: Extraocular movements intact.     Pupils: Pupils are equal, round, and reactive to light.  Neck:     Thyroid: No thyroid mass, thyromegaly or thyroid tenderness.     Vascular: No carotid bruit.  Cardiovascular:     Rate and Rhythm: Normal rate and regular rhythm. No extrasystoles are present.    Pulses:          Dorsalis pedis pulses are 1+ on the right side and 1+ on the left side.     Heart sounds: Normal heart sounds. No murmur heard.    No friction rub. No gallop.  Pulmonary:     Effort: Pulmonary effort is normal.     Breath sounds: Normal breath sounds. No decreased breath sounds, wheezing, rhonchi or rales.  Chest:     Chest wall: No mass.  Abdominal:     Palpations: Abdomen is soft. There is no hepatomegaly, splenomegaly or mass.     Tenderness: There is no abdominal tenderness.     Hernia: No hernia is present.  Musculoskeletal:     Cervical back: Normal range of motion.     Right lower leg: No edema.     Left lower leg: No edema.  Lymphadenopathy:     Cervical: No cervical adenopathy.     Upper Body:     Right upper body: No supraclavicular adenopathy.     Left upper body: No supraclavicular adenopathy.  Skin:    General: Skin is warm and dry.  Neurological:     General: No focal deficit present.     Mental Status: She is alert and oriented to person, place, and time. Mental status is at baseline.     Sensory: Sensation is intact.     Motor: Motor function is intact. No weakness.     Deep Tendon Reflexes: Reflexes are normal and symmetric.  Psychiatric:        Attention and Perception: Attention normal.        Mood and  Affect: Mood normal.        Speech: Speech normal.        Behavior: Behavior normal.        Thought Content: Thought content normal.        Cognition and Memory: Cognition normal.        Judgment: Judgment normal.       Results:   Studies obtained and personally reviewed by me:  Colonoscopy last completed 03/19/22. Showed moderate diverticulosis in sigmoid, descending colon. Examination otherwise normal. Recommended repeat in 2033.  Labs:       Component Value Date/Time   NA 142 10/18/2022 1118   NA 140 04/02/2022 1008   K 5.5 (H) 10/18/2022 1118   CL 105 10/18/2022 1118   CO2 30 10/18/2022 1118   GLUCOSE 87 10/18/2022 1118   BUN 16 10/18/2022 1118   BUN 19 04/02/2022 1008   CREATININE 0.91 10/18/2022 1118   CALCIUM 9.5 10/18/2022 1118   PROT 6.3 10/18/2022 1118   PROT 6.4 05/11/2020 0816   ALBUMIN 4.3 03/04/2022 1506   ALBUMIN 4.3 05/11/2020 0816   AST 14 10/18/2022 1118   AST 14 (L) 03/04/2022 1506   ALT 11 10/18/2022 1118   ALT 13 03/04/2022 1506   ALKPHOS 58 03/04/2022 1506   BILITOT 0.4 10/18/2022 1118   BILITOT 0.5 03/04/2022 1506   GFRNONAA >60 05/31/2022 1217   GFRNONAA >60 03/04/2022 1506   GFRNONAA 83 01/12/2020 1130   GFRAA 86 05/11/2020 0816   GFRAA 97 01/12/2020 1130     Lab Results  Component Value Date   WBC 5.2 10/18/2022   HGB 13.2 10/18/2022   HCT 40.9 10/18/2022   MCV 84.0 10/18/2022   PLT 239 10/18/2022    Lab Results  Component Value Date   CHOL 261 (H) 10/18/2022   HDL 58 10/18/2022   LDLCALC 175 (H) 10/18/2022   TRIG 139 10/18/2022   CHOLHDL 4.5 10/18/2022    Lab Results  Component Value Date   HGBA1C 5.5 10/18/2022     Lab Results  Component Value Date   TSH 37.68 (H) 10/18/2022      Assessment & Plan:   Anxiety and depression: stable with lorazepam 1 mg twice daily as needed, venlafaxine XR 75 mg daily with breakfast.  Situational stress with family-discussed.  Takes Effexor.  Allergies: treated with  montelukast 10 mg daily.  GERD: treated with esomeprazole 40 mg daily at noon.  Hyperlipidemia: does not wish to be on statin medication. CHOL elevated at 261. LDL elevated at 175.  Hypertension: Continues close follow-up with Dr. Nadara Eaton.  Blood pressure normal today at 108/80.  Hypothyroidism:  TSH elevated at 37.68.  May have missed a few doses.  Return in 6 weeks for follow-up.  History of chronic systolic heart failure with ICD device followed by Dr. Ladona Ridgel and Dr. Nadara Eaton.  BMI 25.62-continue diet and exercise regimen.  Is on Mounjaro  History of breast cancer  History of urticaria but no recent episodes  Chronic back pain appears to be stable at this time.  Has had physical therapy  Type 2 diabetes mellitus in April 2023 your hemoglobin A1c was 6.7% and is now excellent at 5.5%  Negative pap smear 02/04/22. Pelvic and breast exams deferred to gynecologist.  Ongoing back pain followed by Dr. Danielle Dess.  Had surgery for herniated disc L5-S1 left recurrent with lumbar radiculopathy February 2024.  Had a large extruded fragment of disc at L5-S1 on the left.  Continues to have some back pain and continues to be followed by Dr. Danielle Dess.  Has Valium to take if needed.  Sometimes takes oxycodone 5/325 for pain  Colonoscopy: last completed 03/19/22. Showed moderate diverticulosis in sigmoid, descending colon. Examination otherwise normal. Recommended repeat in 2033.  Urinalysis normal.  Vaccine counseling: she will call pharmacy for vaccine records. UTD on flu vaccine.  Return in 6 weeks for TSH recheck and office visit.  Patient is  applying for disability benefits and I concur that she qualifies for these benefits.  I,Alexander Ruley,acting as a Neurosurgeon for Margaree Mackintosh, MD.,have documented all relevant documentation on the behalf of Margaree Mackintosh, MD,as directed by  Margaree Mackintosh, MD while in the presence of Margaree Mackintosh, MD.   I, Margaree Mackintosh, MD, have reviewed all documentation for  this visit. The documentation on 10/23/22 for the exam, diagnosis, procedures, and orders are all accurate and complete.

## 2022-10-18 ENCOUNTER — Other Ambulatory Visit: Payer: 59

## 2022-10-18 DIAGNOSIS — E039 Hypothyroidism, unspecified: Secondary | ICD-10-CM

## 2022-10-18 DIAGNOSIS — I152 Hypertension secondary to endocrine disorders: Secondary | ICD-10-CM

## 2022-10-18 DIAGNOSIS — I10 Essential (primary) hypertension: Secondary | ICD-10-CM

## 2022-10-18 LAB — CBC WITH DIFFERENTIAL/PLATELET
Absolute Monocytes: 322 cells/uL (ref 200–950)
HCT: 40.9 % (ref 35.0–45.0)
MCH: 27.1 pg (ref 27.0–33.0)
MCHC: 32.3 g/dL (ref 32.0–36.0)
RBC: 4.87 10*6/uL (ref 3.80–5.10)
RDW: 12.3 % (ref 11.0–15.0)

## 2022-10-19 LAB — LIPID PANEL
Cholesterol: 261 mg/dL — ABNORMAL HIGH (ref <200)
HDL: 58 mg/dL (ref >=50)
LDL Cholesterol (Calc): 175 mg/dL (calc) — ABNORMAL HIGH
Non-HDL Cholesterol (Calc): 203 mg/dL (calc) — ABNORMAL HIGH (ref <130)
Total CHOL/HDL Ratio: 4.5 (calc) (ref <5.0)
Triglycerides: 139 mg/dL (ref <150)

## 2022-10-19 LAB — COMPLETE METABOLIC PANEL WITH GFR
AG Ratio: 2 (calc) (ref 1.0–2.5)
ALT: 11 U/L (ref 6–29)
AST: 14 U/L (ref 10–35)
Albumin: 4.2 g/dL (ref 3.6–5.1)
Alkaline phosphatase (APISO): 59 U/L (ref 37–153)
BUN: 16 mg/dL (ref 7–25)
CO2: 30 mmol/L (ref 20–32)
Calcium: 9.5 mg/dL (ref 8.6–10.4)
Chloride: 105 mmol/L (ref 98–110)
Creat: 0.91 mg/dL (ref 0.50–1.03)
Globulin: 2.1 g/dL (calc) (ref 1.9–3.7)
Glucose, Bld: 87 mg/dL (ref 65–99)
Potassium: 5.5 mmol/L — ABNORMAL HIGH (ref 3.5–5.3)
Sodium: 142 mmol/L (ref 135–146)
Total Bilirubin: 0.4 mg/dL (ref 0.2–1.2)
Total Protein: 6.3 g/dL (ref 6.1–8.1)
eGFR: 76 mL/min/{1.73_m2} (ref >=60)

## 2022-10-19 LAB — CBC WITH DIFFERENTIAL/PLATELET
Basophils Absolute: 52 cells/uL (ref 0–200)
Basophils Relative: 1 %
Eosinophils Absolute: 0 cells/uL — ABNORMAL LOW (ref 15–500)
Eosinophils Relative: 0 %
Hemoglobin: 13.2 g/dL (ref 11.7–15.5)
Lymphs Abs: 1825 cells/uL (ref 850–3900)
MCV: 84 fL (ref 80.0–100.0)
MPV: 10.8 fL (ref 7.5–12.5)
Monocytes Relative: 6.2 %
Neutro Abs: 3000 cells/uL (ref 1500–7800)
Neutrophils Relative %: 57.7 %
Platelets: 239 10*3/uL (ref 140–400)
Total Lymphocyte: 35.1 %
WBC: 5.2 10*3/uL (ref 3.8–10.8)

## 2022-10-19 LAB — HEMOGLOBIN A1C
Hgb A1c MFr Bld: 5.5 % of total Hgb (ref <5.7)
Mean Plasma Glucose: 111 mg/dL
eAG (mmol/L): 6.2 mmol/L

## 2022-10-19 LAB — TSH: TSH: 37.68 mIU/L — ABNORMAL HIGH

## 2022-10-21 ENCOUNTER — Ambulatory Visit: Payer: 59

## 2022-10-21 DIAGNOSIS — I5022 Chronic systolic (congestive) heart failure: Secondary | ICD-10-CM

## 2022-10-21 NOTE — Telephone Encounter (Signed)
From pt

## 2022-10-22 ENCOUNTER — Encounter: Payer: Self-pay | Admitting: Internal Medicine

## 2022-10-22 ENCOUNTER — Ambulatory Visit (INDEPENDENT_AMBULATORY_CARE_PROVIDER_SITE_OTHER): Payer: 59 | Admitting: Internal Medicine

## 2022-10-22 VITALS — BP 108/80 | HR 77 | Temp 98.8°F | Resp 16 | Ht 68.0 in | Wt 168.5 lb

## 2022-10-22 DIAGNOSIS — Z872 Personal history of diseases of the skin and subcutaneous tissue: Secondary | ICD-10-CM

## 2022-10-22 DIAGNOSIS — G8929 Other chronic pain: Secondary | ICD-10-CM

## 2022-10-22 DIAGNOSIS — E039 Hypothyroidism, unspecified: Secondary | ICD-10-CM | POA: Diagnosis not present

## 2022-10-22 DIAGNOSIS — E785 Hyperlipidemia, unspecified: Secondary | ICD-10-CM

## 2022-10-22 DIAGNOSIS — Z0000 Encounter for general adult medical examination without abnormal findings: Secondary | ICD-10-CM

## 2022-10-22 DIAGNOSIS — Z6825 Body mass index (BMI) 25.0-25.9, adult: Secondary | ICD-10-CM

## 2022-10-22 DIAGNOSIS — M549 Dorsalgia, unspecified: Secondary | ICD-10-CM

## 2022-10-22 DIAGNOSIS — F32A Depression, unspecified: Secondary | ICD-10-CM

## 2022-10-22 DIAGNOSIS — Z853 Personal history of malignant neoplasm of breast: Secondary | ICD-10-CM

## 2022-10-22 DIAGNOSIS — E119 Type 2 diabetes mellitus without complications: Secondary | ICD-10-CM

## 2022-10-22 DIAGNOSIS — Z560 Unemployment, unspecified: Secondary | ICD-10-CM

## 2022-10-22 DIAGNOSIS — I5022 Chronic systolic (congestive) heart failure: Secondary | ICD-10-CM

## 2022-10-22 DIAGNOSIS — Z8616 Personal history of COVID-19: Secondary | ICD-10-CM

## 2022-10-22 DIAGNOSIS — I10 Essential (primary) hypertension: Secondary | ICD-10-CM

## 2022-10-22 DIAGNOSIS — F419 Anxiety disorder, unspecified: Secondary | ICD-10-CM | POA: Diagnosis not present

## 2022-10-22 DIAGNOSIS — I427 Cardiomyopathy due to drug and external agent: Secondary | ICD-10-CM

## 2022-10-22 DIAGNOSIS — F439 Reaction to severe stress, unspecified: Secondary | ICD-10-CM

## 2022-10-22 DIAGNOSIS — Z95810 Presence of automatic (implantable) cardiac defibrillator: Secondary | ICD-10-CM

## 2022-10-22 DIAGNOSIS — Z9889 Other specified postprocedural states: Secondary | ICD-10-CM

## 2022-10-22 LAB — POCT URINALYSIS DIPSTICK
Bilirubin, UA: NEGATIVE
Blood, UA: NEGATIVE
Glucose, UA: NEGATIVE
Ketones, UA: NEGATIVE
Leukocytes, UA: NEGATIVE
Nitrite, UA: NEGATIVE
Protein, UA: NEGATIVE
Spec Grav, UA: 1.015 (ref 1.010–1.025)
Urobilinogen, UA: 0.2 E.U./dL
pH, UA: 6 (ref 5.0–8.0)

## 2022-10-22 NOTE — Telephone Encounter (Signed)
From patient.

## 2022-10-23 NOTE — Patient Instructions (Addendum)
It was a pleasure seeing you today.  Discussed elevated cholesterol.  Patient does not want to be on statin medication.  She will continue to diet and exercise regimen.  She will continue with Mounjaro.  Return for follow-up in 6 weeks on hypothyroidism as TSH is significantly elevated at 37.  Continue close follow-up with Cardiology.

## 2022-10-25 NOTE — Progress Notes (Signed)
Echo normal from 30%. Thank you for working her in and for BV placement

## 2022-11-07 ENCOUNTER — Telehealth: Payer: Self-pay | Admitting: Medical

## 2022-11-07 NOTE — Telephone Encounter (Signed)
Toniann Fail called and needed the referral re-faxed to [facility] [phone], that was sent to them in February before she had here surgery.  Faxed it at 12:30PM

## 2022-11-13 ENCOUNTER — Encounter: Payer: Self-pay | Admitting: Internal Medicine

## 2022-11-13 ENCOUNTER — Encounter: Payer: Self-pay | Admitting: Cardiology

## 2022-11-13 NOTE — Telephone Encounter (Signed)
 From patient.

## 2022-11-21 ENCOUNTER — Other Ambulatory Visit: Payer: Self-pay | Admitting: Internal Medicine

## 2022-12-12 ENCOUNTER — Other Ambulatory Visit: Payer: 59

## 2022-12-12 DIAGNOSIS — E039 Hypothyroidism, unspecified: Secondary | ICD-10-CM

## 2022-12-13 ENCOUNTER — Other Ambulatory Visit: Payer: Self-pay

## 2022-12-13 LAB — TSH: TSH: 6.06 m[IU]/L — ABNORMAL HIGH

## 2022-12-13 MED ORDER — LEVOTHYROXINE SODIUM 125 MCG PO TABS: 125.0000 ug | ORAL_TABLET | Freq: Every day | ORAL | 0 refills | Status: DC

## 2022-12-25 ENCOUNTER — Other Ambulatory Visit: Payer: Self-pay | Admitting: Obstetrics & Gynecology

## 2022-12-25 DIAGNOSIS — Z853 Personal history of malignant neoplasm of breast: Secondary | ICD-10-CM

## 2022-12-25 DIAGNOSIS — N63 Unspecified lump in unspecified breast: Secondary | ICD-10-CM

## 2023-01-06 ENCOUNTER — Other Ambulatory Visit: Payer: Self-pay | Admitting: Internal Medicine

## 2023-01-06 ENCOUNTER — Other Ambulatory Visit: Payer: Self-pay

## 2023-01-06 ENCOUNTER — Encounter: Payer: Self-pay | Admitting: Cardiology

## 2023-01-06 DIAGNOSIS — I5022 Chronic systolic (congestive) heart failure: Secondary | ICD-10-CM

## 2023-01-06 DIAGNOSIS — E1169 Type 2 diabetes mellitus with other specified complication: Secondary | ICD-10-CM

## 2023-01-06 MED ORDER — TIRZEPATIDE 15 MG/0.5ML ~~LOC~~ SOAJ
15.0000 mg | SUBCUTANEOUS | 6 refills | Status: DC
Start: 2023-01-06 — End: 2023-07-31

## 2023-01-10 ENCOUNTER — Ambulatory Visit
Admission: RE | Admit: 2023-01-10 | Discharge: 2023-01-10 | Disposition: A | Discharge status: Home / Self Care | Payer: 59 | Source: Ambulatory Visit | Attending: Obstetrics & Gynecology | Admitting: Obstetrics & Gynecology

## 2023-01-10 DIAGNOSIS — Z853 Personal history of malignant neoplasm of breast: Secondary | ICD-10-CM

## 2023-01-10 DIAGNOSIS — N63 Unspecified lump in unspecified breast: Secondary | ICD-10-CM

## 2023-01-10 LAB — HM MAMMOGRAPHY

## 2023-01-20 ENCOUNTER — Encounter: Payer: Self-pay | Admitting: Cardiology

## 2023-01-23 ENCOUNTER — Other Ambulatory Visit: Payer: 59

## 2023-01-23 DIAGNOSIS — E039 Hypothyroidism, unspecified: Secondary | ICD-10-CM

## 2023-01-24 LAB — TSH: TSH: 1.43 m[IU]/L

## 2023-01-27 MED ORDER — METOPROLOL SUCCINATE ER 25 MG PO TB24
25.0000 mg | ORAL_TABLET | Freq: Every day | ORAL | 1 refills | Status: DC
Start: 2023-01-27 — End: 2023-07-17

## 2023-02-10 ENCOUNTER — Encounter: Payer: Self-pay | Admitting: Internal Medicine

## 2023-02-10 ENCOUNTER — Other Ambulatory Visit: Payer: Self-pay | Admitting: Internal Medicine

## 2023-02-11 ENCOUNTER — Other Ambulatory Visit: Payer: Self-pay | Admitting: Internal Medicine

## 2023-02-11 MED ORDER — LORAZEPAM 1 MG PO TABS: ORAL_TABLET | ORAL | 1 refills | Status: DC

## 2023-02-11 NOTE — Addendum Note (Signed)
Addended by: Gregery Na on: 02/11/2023 09:43 AM   Modules accepted: Orders

## 2023-02-11 NOTE — Addendum Note (Signed)
Addended by: Gregery Na on: 02/11/2023 09:44 AM   Modules accepted: Orders

## 2023-02-12 ENCOUNTER — Encounter: Payer: Self-pay | Admitting: Cardiology

## 2023-02-14 ENCOUNTER — Encounter: Payer: Self-pay | Admitting: Internal Medicine

## 2023-02-24 ENCOUNTER — Other Ambulatory Visit: Payer: Self-pay | Admitting: Internal Medicine

## 2023-03-03 ENCOUNTER — Other Ambulatory Visit: Payer: Self-pay | Admitting: *Deleted

## 2023-03-03 ENCOUNTER — Other Ambulatory Visit: Payer: Self-pay

## 2023-03-03 DIAGNOSIS — I5022 Chronic systolic (congestive) heart failure: Secondary | ICD-10-CM

## 2023-03-03 MED ORDER — ENTRESTO 24-26 MG PO TABS
1.0000 | ORAL_TABLET | Freq: Two times a day (BID) | ORAL | 1 refills | Status: DC
Start: 2023-03-03 — End: 2023-03-03

## 2023-03-03 MED ORDER — ENTRESTO 24-26 MG PO TABS: 1.0000 | ORAL_TABLET | Freq: Two times a day (BID) | ORAL | 1 refills | Status: DC

## 2023-03-04 ENCOUNTER — Encounter: Payer: Self-pay | Admitting: Internal Medicine

## 2023-03-07 ENCOUNTER — Other Ambulatory Visit: Payer: Self-pay | Admitting: *Deleted

## 2023-03-07 DIAGNOSIS — Z171 Estrogen receptor negative status [ER-]: Secondary | ICD-10-CM

## 2023-03-10 ENCOUNTER — Inpatient Hospital Stay (HOSPITAL_BASED_OUTPATIENT_CLINIC_OR_DEPARTMENT_OTHER): Payer: 59 | Admitting: Hematology and Oncology

## 2023-03-10 ENCOUNTER — Inpatient Hospital Stay: Payer: 59 | Attending: Internal Medicine

## 2023-03-10 VITALS — BP 113/63 | HR 61 | Temp 97.5°F | Resp 18 | Ht 68.0 in | Wt 172.9 lb

## 2023-03-10 DIAGNOSIS — C50411 Malignant neoplasm of upper-outer quadrant of right female breast: Secondary | ICD-10-CM

## 2023-03-10 DIAGNOSIS — Z853 Personal history of malignant neoplasm of breast: Secondary | ICD-10-CM | POA: Diagnosis present

## 2023-03-10 DIAGNOSIS — Z171 Estrogen receptor negative status [ER-]: Secondary | ICD-10-CM | POA: Diagnosis not present

## 2023-03-10 DIAGNOSIS — Z9013 Acquired absence of bilateral breasts and nipples: Secondary | ICD-10-CM | POA: Insufficient documentation

## 2023-03-10 DIAGNOSIS — Z9221 Personal history of antineoplastic chemotherapy: Secondary | ICD-10-CM | POA: Diagnosis not present

## 2023-03-10 LAB — CBC WITH DIFFERENTIAL (CANCER CENTER ONLY)
Abs Immature Granulocytes: 0.01 10*3/uL (ref 0.00–0.07)
Basophils Absolute: 0 10*3/uL (ref 0.0–0.1)
Basophils Relative: 1 %
Eosinophils Absolute: 0.2 10*3/uL (ref 0.0–0.5)
Eosinophils Relative: 3 %
HCT: 39.2 % (ref 36.0–46.0)
Hemoglobin: 12.5 g/dL (ref 12.0–15.0)
Immature Granulocytes: 0 %
Lymphocytes Relative: 28 %
Lymphs Abs: 1.6 10*3/uL (ref 0.7–4.0)
MCH: 27.6 pg (ref 26.0–34.0)
MCHC: 31.9 g/dL (ref 30.0–36.0)
MCV: 86.5 fL (ref 80.0–100.0)
Monocytes Absolute: 0.4 10*3/uL (ref 0.1–1.0)
Monocytes Relative: 7 %
Neutro Abs: 3.5 10*3/uL (ref 1.7–7.7)
Neutrophils Relative %: 61 %
Platelet Count: 224 10*3/uL (ref 150–400)
RBC: 4.53 MIL/uL (ref 3.87–5.11)
RDW: 13.3 % (ref 11.5–15.5)
WBC Count: 5.8 10*3/uL (ref 4.0–10.5)
nRBC: 0 % (ref 0.0–0.2)

## 2023-03-10 LAB — CMP (CANCER CENTER ONLY)
ALT: 9 U/L (ref 0–44)
AST: 11 U/L — ABNORMAL LOW (ref 15–41)
Albumin: 4.2 g/dL (ref 3.5–5.0)
Alkaline Phosphatase: 55 U/L (ref 38–126)
Anion gap: 3 — ABNORMAL LOW (ref 5–15)
BUN: 19 mg/dL (ref 6–20)
CO2: 31 mmol/L (ref 22–32)
Calcium: 9.2 mg/dL (ref 8.9–10.3)
Chloride: 108 mmol/L (ref 98–111)
Creatinine: 0.92 mg/dL (ref 0.44–1.00)
GFR, Estimated: >60 mL/min (ref >60)
Glucose, Bld: 91 mg/dL (ref 70–99)
Potassium: 4.8 mmol/L (ref 3.5–5.1)
Sodium: 142 mmol/L (ref 135–145)
Total Bilirubin: 0.3 mg/dL (ref <1.2)
Total Protein: 6.4 g/dL — ABNORMAL LOW (ref 6.5–8.1)

## 2023-03-10 NOTE — Assessment & Plan Note (Signed)
07/09/2017:Patient palpated a right breast mass which was evaluated by mammogram and ultrasound and a breast MRI which revealed 4.5 x 4 cm necrotic tumor with suspicious multiple right axillary lymph nodes; biopsy revealed IDC grade 3 ER 0%, PR 0%, HER-2 positive ratio 3.08, Ki-67 70%, T2NX stage IIa/IIb   Treatment plan based on multidisciplinary tumor board: 1. Neoadjuvant chemotherapy with TCH Perjeta 6 cycles followed by Herceptin and Perjeta maintenance for 1 year 2. Followed by bilateral mastectomies with targeted node dissection on the right 11/18/2017 3.  Due to significant residual disease, additional adjuvant chemo with Adriamycin and Cytoxan 12/18/2017-01/27/2018 4.  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  Apparent soft tissue fullness in the colon.  ECHO 12/2021: EF 25-30%: Patient is going to see cardiology to consider pacemaker implantation. No role of imaging since she had bilateral mastectomies. Chest exam 03/10/2023: Benign   Return to clinic in 1 year for follow-up.

## 2023-03-10 NOTE — Progress Notes (Signed)
Patient Care Team: Margaree Mackintosh, MD as PCP - General (Internal Medicine) Baxley, Luanna Cole, MD (Internal Medicine)  DIAGNOSIS:  Encounter Diagnosis  Name Primary?   Malignant neoplasm of upper-outer quadrant of right breast in female, estrogen receptor negative (HCC) Yes    SUMMARY OF ONCOLOGIC HISTORY: Oncology History  Malignant neoplasm of upper-outer quadrant of right breast in female, estrogen receptor negative (HCC)  07/09/2017 Initial Diagnosis   Patient palpated a right breast mass which was evaluated by mammogram and ultrasound and a breast MRI which revealed 4.5 x 4 cm necrotic tumor with suspicious multiple right axillary lymph nodes; biopsy revealed IDC grade 3 ER 0%, PR 0%, HER-2 positive ratio 3.08, Ki-67 70%, T2NX stage IIa/IIb   07/18/2017 - 11/04/2017 Neo-Adjuvant Chemotherapy   TCH Perjeta x6 cycles followed by Herceptin Perjeta maintenance   08/15/2017 Genetic Testing   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  The Multi-Gene Panel offered by [facility] includes sequencing and/or deletion duplication testing of the following 83 genes: ALK, APC, ATM, AXIN2,BAP1,  BARD1, BLM, BMPR1A, BRCA1, BRCA2, BRIP1, CASR, CDC73, CDH1, CDK4, CDKN1B, CDKN1C, CDKN2A (p14ARF), CDKN2A (p16INK4a), CEBPA, CHEK2, CTNNA1, DICER1, DIS3L2, EGFR (c.2369C>T, p.Thr790Met variant only), EPCAM (Deletion/duplication testing only), FH, FLCN, GATA2, GPC3, GREM1 (Promoter region deletion/duplication testing only), HOXB13 (c.251G>A, p.Gly84Glu), HRAS, KIT, MAX, MEN1, MET, MITF (c.952G>A, p.Glu318Lys variant only), MLH1, MSH2, MSH3, MSH6, MUTYH, NBN, NF1, NF2, NTHL1, PALB2, PDGFRA, PHOX2B, PMS2, POLD1, POLE, POT1, PRKAR1A, PTCH1, PTEN, RAD50, RAD51C, RAD51D, RB1, RECQL4, RET, RUNX1, SDHAF2, SDHA (sequence changes only), SDHB, SDHC, SDHD, SMAD4, SMARCA4, SMARCB1, SMARCE1, STK11, SUFU, TERT, TERT, TMEM127, TP53, TSC1, TSC2, VHL, WRN and WT1.  The report date is August 15, 2017.    11/18/2017 Surgery   Bilateral mastectomies: Right mastectomy: IDC grade 3 3.8 cm margins negative, 0/4 lymph nodes negative, ER 0%, PR 0%, HER-2 negative ratio 1.3, IHC HER-2 negative; Ki-67 70%, RCB class II; [facility] maste[facility]: [facility]   12/18/2017 - 01/27/2018 Chemotherapy   Adjuvant chemotherapy with dose dense Adriamycin and Cytoxan x4   02/17/2018 -  Chemotherapy   Maintenance Herceptin and Perjeta   02/24/2018 Surgery   REMOVAL OF BILATERAL TISSUE EXPANDERS WITH PLACEMENT OF BILATERAL BREAST IMPLANTS and LIPOFILLING FROM ABDOMEN TO BILATERAL CHEST by Dr. Leta Baptist      CHIEF COMPLIANT: Surveillance of breast cancer   History of Present Illness   The patient, with a history of breast cancer, heart disease, and thyroid issues, presents with recent significant weight loss from [facility] She attributes this to better management of her thyroid condition and diabetes, with her A1c dropping from 11 to 5.8 from [place]. She reports feeling well overall, with occasional bouts of tiredness. She had a defibrillator implanted in December and has not reported any issues with it. The patient has been experiencing abdominal pain, which she suspects might be due to a hernia. She has not had any imaging done recently to confirm this. She is also discussing her future plans for cancer surveillance, including a new blood test that can detect cancer recurrence.      ALLERGIES:  has No Known Allergies.  MEDICATIONS:  Current Outpatient Medications  Medication Sig Dispense Refill   Aspirin-Caffeine (BAYER BACK & BODY) 500-32.5 MG TABS Take 2 tablets by mouth daily as needed (Pain).     EPINEPHrine 0.3 mg/0.3 mL IJ SOAJ injection Use as directed for life threatening allergic reactions only (Patient taking differently: Inject 0.3 mg into the muscle as needed for anaphylaxis. Use as  directed for life threatening allergic reactions only) 2 each 3   esomeprazole (NEXIUM) 40 MG capsule TAKE 1 CAPSULE (40 MG TOTAL)  BY MOUTH DAILY AT 12 NOON. 90 capsule 3   furosemide (LASIX) 40 MG tablet TAKE 1 TABLET BY MOUTH EVERY DAY AS NEEDED FOR FLUID *SHORTNESS OF BREATH* 30 tablet 1   ibuprofen (MOTRIN IB) 200 MG tablet Take 400 mg by mouth every 6 (six) hours as needed for mild pain or moderate pain.     levothyroxine (SYNTHROID) 125 MCG tablet Take 1 tablet (125 mcg total) by mouth daily. 30 tablet 0   LORazepam (ATIVAN) 1 MG tablet TAKE 1 TABLET BY MOUTH TWICE A DAY AS NEEDED 60 tablet 1   meloxicam (MOBIC) 15 MG tablet Take 15 mg by mouth daily.     metoprolol succinate (TOPROL-XL) 25 MG 24 hr tablet Take 1 tablet (25 mg total) by mouth daily. Take with or immediately following a meal. 90 tablet 1   montelukast (SINGULAIR) 10 MG tablet TAKE ONE TABLET BY MOUTH DAILY 90 tablet 0   sacubitril-valsartan (ENTRESTO) 24-26 MG Take 1 tablet by mouth 2 (two) times daily. 180 tablet 1   testosterone cypionate (DEPO-TESTOSTERONE) 200 MG/ML injection Inject 200 mg into the muscle every 3 (three) months.     tirzepatide (MOUNJARO) 15 MG/0.5ML Pen Inject 15 mg into the skin once a week. 2 mL 6   VAGIFEM 10 MCG TABS vaginal tablet Place 1 tablet vaginally 2 (two) times a week.     venlafaxine XR (EFFEXOR-XR) 75 MG 24 hr capsule Take 1 capsule (75 mg total) by mouth daily with breakfast. 90 capsule 0   No current facility-administered medications for this visit.    PHYSICAL EXAMINATION: ECOG PERFORMANCE STATUS: 1 - Symptomatic but completely ambulatory  Vitals:   03/10/23 1527  BP: 113/63  Pulse: 61  Resp: 18  Temp: (!) 97.5 F (36.4 C)  SpO2: 99%   Filed Weights   03/10/23 1527  Weight: 172 lb 14.4 oz (78.4 kg)      LABORATORY DATA:  I have reviewed the data as listed    Latest Ref Rng & Units 03/10/2023    3:05 PM 10/18/2022   11:18 AM 05/31/2022   12:17 PM  CMP  Glucose 70 - 99 mg/dL 91  87  161   BUN 6 - 20 mg/dL 19  16  9    Creatinine 0.44 - 1.00 mg/dL 0.96  0.45  4.09   Sodium 135 - 145 mmol/L 142   142  139   Potassium 3.5 - 5.1 mmol/L 4.8  5.5  4.8   Chloride 98 - 111 mmol/L 108  105  105   CO2 22 - 32 mmol/L 31  30  29    Calcium 8.9 - 10.3 mg/dL 9.2  9.5  9.3   Total Protein 6.5 - 8.1 g/dL 6.4  6.3    Total Bilirubin <1.2 mg/dL 0.3  0.4    Alkaline Phos 38 - 126 U/L 55     AST 15 - 41 U/L 11  14    ALT 0 - 44 U/L 9  11      Lab Results  Component Value Date   WBC 5.8 03/10/2023   HGB 12.5 03/10/2023   HCT 39.2 03/10/2023   MCV 86.5 03/10/2023   PLT 224 03/10/2023   NEUTROABS 3.5 03/10/2023    ASSESSMENT & PLAN:  Malignant neoplasm of upper-outer quadrant of right breast in female, estrogen receptor negative (HCC)  07/09/2017:Patient palpated a right breast mass which was evaluated by mammogram and ultrasound and a breast MRI which revealed 4.5 x 4 cm necrotic tumor with suspicious multiple right axillary lymph nodes; biopsy revealed IDC grade 3 ER 0%, PR 0%, HER-2 positive ratio 3.08, Ki-67 70%, T2NX stage IIa/IIb   Treatment plan based on multidisciplinary tumor board: 1. Neoadjuvant chemotherapy with TCH Perjeta 6 cycles followed by Herceptin and Perjeta maintenance for 1 year 2. Followed by bilateral mastectomies with targeted node dissection on the right 11/18/2017 3.  Due to significant residual disease, additional adjuvant chemo with Adriamycin and Cytoxan 12/18/2017-01/27/2018 4.  Herceptin maintenance completed 09/29/2018 ----------------------------------------------------------------------------- 11/18/2017:Bilateral mastectomies: Right mastectomy: IDC grade 3 3.8 cm margins negative, 0/4 lymph nodes negative, ER 0%, PR 0%, HER-2 negative ratio 1.3, IHC HER-2 negative; Ki-67 70%, RCB class II; left mastectomy: [facility]   Current treatment: Surveillance   CT CAP 03/04/22: No evidence of metastatic disease.  Apparent soft tissue fullness in the colon.  ECHO 12/2021: EF 25-30%: Patient is going to see cardiology to consider pacemaker implantation. No role of imaging since she  had bilateral mastectomies. Chest exam 03/10/2023: Benign      Cardiac Health Patient has a defibrillator implanted in December of the previous year due to low ejection fraction. Currently under the care of Dr. Jacinto Halim. -Continue follow-up with cardiologist.  Diabetes Significant improvement in glycemic control with recent A1c of 5.8, down from 11. -Continue current management plan.  Abdominal Pain Patient reports abdominal pain, suspecting possible hernia. -Order CT scan of abdomen and pelvis with IV contrast. -Recommend follow-up with surgeon (Dr. Dwain Sarna) for further evaluation.  Recommend obtaining guardant reveal every 6 months for surveillance of breast cancer.  We will plan on doing annual CT scans. Return to clinic in 1 year for follow-up     Orders Placed This Encounter  Procedures   CT CHEST ABDOMEN PELVIS W CONTRAST    Standing Status:   Future    Standing Expiration Date:   03/09/2024    Order Specific Question:   If indicated for the ordered procedure, I authorize the administration of contrast media per Radiology protocol    Answer:   Yes    Order Specific Question:   Does the patient have a contrast media/X-ray dye allergy?    Answer:   No    Order Specific Question:   Is patient pregnant?    Answer:   No    Order Specific Question:   Preferred imaging location?    Answer:   [facility]    Order Specific Question:   Release to patient    Answer:   Immediate    Order Specific Question:   If indicated for the ordered procedure, I authorize the administration of oral contrast media per Radiology protocol    Answer:   Yes   The patient has a good understanding of the overall plan. she agrees with it. she will call with any problems that may develop before the next visit here. Total time spent: 30 mins including face to face time and time spent for planning, charting and co-ordination of care   Tamsen Meek, MD 03/10/23

## 2023-03-11 ENCOUNTER — Telehealth: Payer: Self-pay

## 2023-03-11 NOTE — Telephone Encounter (Signed)
Per md orders entered for Guardant Reveal. All supported documents faxed to [phone]. Faxed confirmation was received.

## 2023-03-17 ENCOUNTER — Other Ambulatory Visit: Payer: Self-pay | Admitting: Internal Medicine

## 2023-03-17 MED ORDER — LEVOTHYROXINE SODIUM 125 MCG PO TABS: 125.0000 ug | ORAL_TABLET | Freq: Every day | ORAL | 0 refills | Status: DC

## 2023-03-20 ENCOUNTER — Encounter: Payer: Self-pay | Admitting: Internal Medicine

## 2023-03-21 ENCOUNTER — Ambulatory Visit
Admission: RE | Admit: 2023-03-21 | Discharge: 2023-03-21 | Disposition: A | Discharge status: Home / Self Care | Payer: 59 | Source: Ambulatory Visit | Attending: Internal Medicine | Admitting: Internal Medicine

## 2023-03-21 ENCOUNTER — Encounter: Payer: Self-pay | Admitting: Internal Medicine

## 2023-03-21 ENCOUNTER — Other Ambulatory Visit: Payer: Self-pay

## 2023-03-21 ENCOUNTER — Ambulatory Visit (INDEPENDENT_AMBULATORY_CARE_PROVIDER_SITE_OTHER): Payer: 59 | Admitting: Internal Medicine

## 2023-03-21 VITALS — BP 98/70 | HR 74 | Temp 98.6°F | Ht 68.0 in | Wt 172.0 lb

## 2023-03-21 DIAGNOSIS — I427 Cardiomyopathy due to drug and external agent: Secondary | ICD-10-CM | POA: Diagnosis not present

## 2023-03-21 DIAGNOSIS — J22 Unspecified acute lower respiratory infection: Secondary | ICD-10-CM | POA: Diagnosis not present

## 2023-03-21 DIAGNOSIS — Z853 Personal history of malignant neoplasm of breast: Secondary | ICD-10-CM

## 2023-03-21 DIAGNOSIS — R059 Cough, unspecified: Secondary | ICD-10-CM

## 2023-03-21 DIAGNOSIS — R0989 Other specified symptoms and signs involving the circulatory and respiratory systems: Secondary | ICD-10-CM

## 2023-03-21 MED ORDER — CEFTRIAXONE SODIUM 1 G IJ SOLR
1.0000 g | Freq: Once | INTRAMUSCULAR | Status: AC
Start: 2023-03-21 — End: 2023-03-21
  Administered 2023-03-21: 1 g via INTRAMUSCULAR

## 2023-03-21 MED ORDER — HYDROCODONE BIT-HOMATROP MBR 5-1.5 MG/5ML PO SOLN: 5.0000 mL | Freq: Three times a day (TID) | ORAL | 0 refills | Status: DC | PRN

## 2023-03-21 MED ORDER — AZITHROMYCIN 250 MG PO TABS: ORAL_TABLET | ORAL | 0 refills | Status: AC

## 2023-03-21 MED ORDER — CEFTRIAXONE SODIUM 1 G IJ SOLR
1.0000 g | Freq: Once | INTRAMUSCULAR | Status: DC
Start: 2023-03-21 — End: 2023-03-21

## 2023-03-21 NOTE — Patient Instructions (Addendum)
CXR is negative for heart failure or pneumonia.  Recommend resting at home and staying well-hydrated.  Please take Zithromax Z-PAK 2 tabs day 1 followed by 1 tab days 2 through 5.  Given 1 g IM Rocephin today for acute lower respiratory infection.  Will take Hycodan syrup 1 teaspoon every 8 hours as needed for cough

## 2023-03-21 NOTE — Progress Notes (Signed)
Patient Care Team: Margaree Mackintosh, MD as PCP - General (Internal Medicine) Lenord Fellers Luanna Cole, MD (Internal Medicine)  Visit Date: 03/21/23  Subjective:    Patient ID: Rebecca [name] , Female   DOB: Feb 17, 1971, 52 y.o.    MRN: [id number]       52 y.o. Female presents today for an acute office visit for cough.  She complains of cold-like symptoms since at least this past Sunday 11/17. She initially developed a sore throat, and had an episode of vomiting Sunday evening. Her sore throat resolved on Monday, but she subsequently developed diarrhea (Monday and Tuesday), post-nasal drip, and a "barking" cough with intermittent sputum production. Never had a fever. She has been treating her symptoms with a hydrocodone cough syrup for which she requests a refill.  She has not taken a COVID test; offered today but she declines as she feels this is more related to a standard cold. Of note, recent possible sick contacts include young children as their basketball season has started.   Past Medical History:  Diagnosis Date   Acute respiratory disease due to COVID-19 virus 12/25/2019   Allergy    constant hives post chemo therapy.  All allergen tests have been neg.   Anxiety    Breast cancer (HCC)    Coronary artery disease    CRTD Medtronic COBALT XT CRT D 04/05/2022 04/05/2022   Diabetes mellitus without complication (HCC)    Diverticulitis    Encounter for assessment of implantable cardioverter-defibrillator (ICD) 04/06/2022   GERD (gastroesophageal reflux disease)    Heart failure (HCC) 01/02/2022   EF 25 to 30%   Hiatal hernia    Hyperlipidemia    Hypertension    Hypothyroidism    LBBB (left bundle branch block)    Pneumonia      Family History  Problem Relation Age of Onset   Lymphoma Father    Cancer Father        lymphoma   Diabetes Brother    Diabetes Mother    Liver disease Mother        NASH, d. 54   Asthma Mother    CAD Maternal Grandmother    CAD Maternal  Grandfather    Stomach cancer Neg Hx    Colon cancer Neg Hx    Colon polyps Neg Hx    Esophageal cancer Neg Hx    Rectal cancer Neg Hx     Social Hx: Married. Previously worked for [employer] but job was terminated.  Had Covid 19 early in the pandemic and was hospitalized. Saw Dr. Celine Mans, Pulmonologist in follow up shorthly after discharge. Was on home oxygen for a few weeks after hospitalization.  Has chronic systolic heart failure seen by Dr. Nadara Eaton.Hx of estrogen negative right breast cancer s/p Bilateral mastectomy and adjuvant chemotherapy with Adriamycin and Cytoxan and underwent breast reconstruction therapy. Underwent BV ICD implantation December 2023 and continues to be followed closely by Cardiology. March in the Spring of 2024, had several episodes of syncope and Carvidilol  dose was reduced as was [employer].Coronary calcium score in 2020 was 50. 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 Hx of mixed plaque in proximal LAD  with mild stenosis less than 50%. Tradmill testing has been suggested by Cardiology.EKG in March 2024 showed atrial sensing and biventricular paced rhythm at rate 89 bpm.  The patient currently does not have the stamina to work a full time job and she should appeal recent decision denying  her Disability Claim.   Review of Systems  Constitutional:  Negative for fever and malaise/fatigue.  HENT:  Positive for congestion. Negative for sore throat.   Eyes:  Negative for blurred vision.  Respiratory:  Positive for cough and sputum production (Intermittent). Negative for shortness of breath.   Cardiovascular:  Negative for chest pain, palpitations and leg swelling.  Gastrointestinal:  Negative for diarrhea and vomiting.  Musculoskeletal:  Negative for back pain.  Skin:  Negative for rash.  Neurological:  Negative for loss of consciousness and headaches.        Objective:   Vitals: BP 98/70   Pulse 74   Temp 98.6 F (37  C)   Ht 5\' 8"  (1.727 m)   Wt 172 lb (78 kg)   LMP  (LMP Unknown)   SpO2 96%   BMI 26.15 kg/m    Physical Exam Vitals and nursing note reviewed.  Constitutional:      General: She is not in acute distress.    Appearance: Normal appearance. She is not toxic-appearing.  HENT:     Head: Normocephalic and atraumatic.     Ears:     Comments: Left TM appears full and pink. Right TM appears slightly full but not erythematous.     Mouth/Throat:     Pharynx: Posterior oropharyngeal erythema present.  Cardiovascular:     Rate and Rhythm: Normal rate and regular rhythm. No extrasystoles are present.    Pulses: Normal pulses.     Heart sounds: Normal heart sounds. No murmur heard.    No friction rub. No gallop.  Pulmonary:     Effort: Pulmonary effort is normal. No respiratory distress.     Breath sounds: No wheezing.     Comments: Coarse breath sounds auscultated in bilateral lung bases. Skin:    General: Skin is warm and dry.  Neurological:     Mental Status: She is alert and oriented to person, place, and time. Mental status is at baseline.  Psychiatric:        Mood and Affect: Mood normal.        Behavior: Behavior normal.        Thought Content: Thought content normal.        Judgment: Judgment normal.       Results:   Studies obtained and personally reviewed by me:   Labs:       Component Value Date/Time   NA 142 03/10/2023 1505   NA 140 04/02/2022 1008   K 4.8 03/10/2023 1505   CL 108 03/10/2023 1505   CO2 31 03/10/2023 1505   GLUCOSE 91 03/10/2023 1505   BUN 19 03/10/2023 1505   BUN 19 04/02/2022 1008   CREATININE 0.92 03/10/2023 1505   CREATININE 0.91 10/18/2022 1118   CALCIUM 9.2 03/10/2023 1505   PROT 6.4 (L) 03/10/2023 1505   PROT 6.4 05/11/2020 0816   ALBUMIN 4.2 03/10/2023 1505   ALBUMIN 4.3 05/11/2020 0816   AST 11 (L) 03/10/2023 1505   ALT 9 03/10/2023 1505   ALKPHOS 55 03/10/2023 1505   BILITOT 0.3 03/10/2023 1505   GFRNONAA >60 03/10/2023  1505   GFRNONAA 83 01/12/2020 1130   GFRAA 86 05/11/2020 0816   GFRAA 97 01/12/2020 1130     Lab Results  Component Value Date   WBC 5.8 03/10/2023   HGB 12.5 03/10/2023   HCT 39.2 03/10/2023   MCV 86.5 03/10/2023   PLT 224 03/10/2023    Lab Results  Component Value Date  CHOL 261 (H) 10/18/2022   HDL 58 10/18/2022   LDLCALC 175 (H) 10/18/2022   TRIG 139 10/18/2022   CHOLHDL 4.5 10/18/2022    Lab Results  Component Value Date   HGBA1C 5.5 10/18/2022     Lab Results  Component Value Date   TSH 1.43 01/23/2023        Assessment & Plan:    Acute Lower respiratory infection - Her pharynx appears erythematous but she reports her sore throat had resolved Monday. She has coarse breath sounds in bilateral lung bases concerning for pneumonia, but may be lower respiratory infection. CXR today is negative for heart failure or pneumonia  Hypothyroidism s/p RAI therapy March 2018  Depression- stable  Hx of breast cancer requiring chemotherapy  Nonischemic cardiomyopathy s/p BV ICD implantation December 2023    Plan: Given her past medical history, we will treat with Rocephin 1 g injection which was administered today. A chest x-ray will be ordered today. We will also prescribe a Z-Pak to take 2 tabs day 1 followed by 1 tab days 2 through 5 and Hycodan syrup to take 1 teaspoon every 8 hours as needed for cough.   Furthermore, she has been advised by me to appeal the denial of  her recent disability application. She should be considered completely disabled.   I,Mathew Stumpf,acting as a Neurosurgeon for Margaree Mackintosh, MD.,have documented all relevant documentation on the behalf of Margaree Mackintosh, MD,as directed by  Margaree Mackintosh, MD while in the presence of Margaree Mackintosh, MD.   Time spent with Ov, medical decision making and chart review is 25 minutes.

## 2023-03-21 NOTE — Telephone Encounter (Signed)
[facility] [address] does not have this in stock.

## 2023-03-30 ENCOUNTER — Encounter: Payer: Self-pay | Admitting: Hematology and Oncology

## 2023-03-31 ENCOUNTER — Telehealth: Payer: Self-pay

## 2023-03-31 NOTE — Telephone Encounter (Signed)
S/w pt and extended apology for scan not being scheduled yet with expected date 11/24.  Message sent to PA team to initiate PA. Scan scheduled for 04/02/23 at 1230 [facility] radiology. Pt understands she needs to arrive at 1200 for check in. She verbalized agreement.

## 2023-04-02 ENCOUNTER — Telehealth: Payer: Self-pay | Admitting: *Deleted

## 2023-04-02 ENCOUNTER — Encounter (HOSPITAL_COMMUNITY): Payer: Self-pay

## 2023-04-02 ENCOUNTER — Ambulatory Visit (HOSPITAL_COMMUNITY)
Admission: RE | Admit: 2023-04-02 | Discharge: 2023-04-02 | Disposition: A | Discharge status: Home / Self Care | Payer: 59 | Source: Ambulatory Visit | Attending: Hematology and Oncology | Admitting: Hematology and Oncology

## 2023-04-02 DIAGNOSIS — Z171 Estrogen receptor negative status [ER-]: Secondary | ICD-10-CM | POA: Diagnosis present

## 2023-04-02 DIAGNOSIS — C50411 Malignant neoplasm of upper-outer quadrant of right female breast: Secondary | ICD-10-CM | POA: Insufficient documentation

## 2023-04-02 MED ORDER — IOHEXOL 300 MG/ML  SOLN
100.0000 mL | Freq: Once | INTRAMUSCULAR | Status: AC | PRN
Start: 2023-04-02 — End: 2023-04-02
  Administered 2023-04-02: 100 mL via INTRAVENOUS

## 2023-04-02 NOTE — Telephone Encounter (Signed)
Per MD request RN placed call to pt with recent Guardant Reveal results being negative.  Pt educated and verbalized understanding.

## 2023-04-03 ENCOUNTER — Encounter: Payer: Self-pay | Admitting: Hematology and Oncology

## 2023-04-07 ENCOUNTER — Ambulatory Visit: Payer: 59

## 2023-04-07 DIAGNOSIS — I5022 Chronic systolic (congestive) heart failure: Secondary | ICD-10-CM

## 2023-04-07 DIAGNOSIS — I427 Cardiomyopathy due to drug and external agent: Secondary | ICD-10-CM

## 2023-04-08 LAB — CUP PACEART REMOTE DEVICE CHECK
Battery Remaining Longevity: 92 mo
Battery Voltage: 2.99 V
Brady Statistic RV Percent Paced: 99.5 %
Date Time Interrogation Session: 20241207060126
HighPow Impedance: 62 Ohm
Implantable Lead Connection Status: 753985
Implantable Lead Connection Status: 753985
Implantable Lead Connection Status: 753985
Implantable Lead Implant Date: 20231208
Implantable Lead Implant Date: 20231208
Implantable Lead Implant Date: 20231208
Implantable Lead Location: 753858
Implantable Lead Location: 753859
Implantable Lead Location: 753860
Implantable Lead Model: 5076
Implantable Lead Model: 6935
Implantable Pulse Generator Implant Date: 20231208
Lead Channel Impedance Value: 285 Ohm
Lead Channel Impedance Value: 285 Ohm
Lead Channel Impedance Value: 399 Ohm
Lead Channel Impedance Value: 399 Ohm
Lead Channel Impedance Value: 494 Ohm
Lead Channel Impedance Value: 532 Ohm
Lead Channel Pacing Threshold Amplitude: 0.625 V
Lead Channel Pacing Threshold Amplitude: 0.625 V
Lead Channel Pacing Threshold Amplitude: 0.625 V
Lead Channel Pacing Threshold Pulse Width: 0.4 ms
Lead Channel Pacing Threshold Pulse Width: 0.4 ms
Lead Channel Pacing Threshold Pulse Width: 0.4 ms
Lead Channel Sensing Intrinsic Amplitude: 12.3 mV
Lead Channel Sensing Intrinsic Amplitude: 3.5 mV
Lead Channel Setting Pacing Amplitude: 2 V
Lead Channel Setting Pacing Amplitude: 2 V
Lead Channel Setting Pacing Amplitude: 2 V
Lead Channel Setting Pacing Pulse Width: 0.4 ms
Lead Channel Setting Pacing Pulse Width: 0.4 ms
Lead Channel Setting Sensing Sensitivity: 0.3 mV
Zone Setting Status: 755011
Zone Setting Status: 755011

## 2023-04-15 ENCOUNTER — Encounter: Payer: Self-pay | Admitting: Cardiology

## 2023-04-29 ENCOUNTER — Telehealth: Payer: Self-pay | Admitting: Cardiology

## 2023-04-29 DIAGNOSIS — I427 Cardiomyopathy due to drug and external agent: Secondary | ICD-10-CM

## 2023-04-29 DIAGNOSIS — I5022 Chronic systolic (congestive) heart failure: Secondary | ICD-10-CM

## 2023-04-29 DIAGNOSIS — Z95810 Presence of automatic (implantable) cardiac defibrillator: Secondary | ICD-10-CM

## 2023-04-29 NOTE — Telephone Encounter (Signed)
 ICD-10-CM   1. Chronic systolic heart failure (HCC)  P49.77 ECHOCARDIOGRAM COMPLETE    2. 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 Implantable cardioverter-defibrillator (ICD) in situ  Z95.810 ECHOCARDIOGRAM COMPLETE

## 2023-05-02 ENCOUNTER — Other Ambulatory Visit: Payer: Self-pay | Admitting: Internal Medicine

## 2023-05-02 NOTE — Telephone Encounter (Signed)
 Echo has been scheduled for January 28. I placed call to patient to schedule appointment with Dr Jacinto Halim after echo.  Left message to call office.

## 2023-05-02 NOTE — Telephone Encounter (Signed)
 Appointment with Dr Jacinto Halim has been scheduled for Jun 02, 2023

## 2023-05-13 ENCOUNTER — Encounter: Payer: Self-pay | Admitting: Hematology and Oncology

## 2023-05-13 ENCOUNTER — Other Ambulatory Visit (HOSPITAL_COMMUNITY): Payer: Self-pay

## 2023-05-13 ENCOUNTER — Telehealth: Payer: Self-pay | Admitting: Pharmacy Technician

## 2023-05-13 NOTE — Telephone Encounter (Signed)
 Pharmacy Patient Advocate Encounter   Received notification from CoverMyMeds that prior authorization for Mounjaro  15MG /0.5ML auto-injectors is required/requested.   Insurance verification completed.   The patient is insured through [facility] .   Per test claim: PA required; PA submitted to above mentioned insurance via CoverMyMeds Key/confirmation #/EOC [id number] Status is pending

## 2023-05-15 ENCOUNTER — Other Ambulatory Visit: Payer: Self-pay | Admitting: Internal Medicine

## 2023-05-15 MED ORDER — LEVOTHYROXINE SODIUM 125 MCG PO TABS: 125.0000 ug | ORAL_TABLET | Freq: Every day | ORAL | 1 refills | Status: DC

## 2023-05-19 ENCOUNTER — Other Ambulatory Visit (HOSPITAL_COMMUNITY): Payer: Self-pay

## 2023-05-19 ENCOUNTER — Telehealth: Payer: Self-pay | Admitting: Pharmacy Technician

## 2023-05-19 NOTE — Telephone Encounter (Signed)
PA request has been Approved. New Encounter created for follow up. For additional info see Pharmacy Prior Auth telephone encounter from 05/19/23.

## 2023-05-19 NOTE — Telephone Encounter (Signed)
Pharmacy Patient Advocate Encounter  Received notification from [facility] that Prior Authorization for Mounjaro 15MG /0.5ML auto-injectors has been APPROVED from 05/13/23 to 05/12/24. Unable to obtain price due to refill too soon rejection, last fill date 05/13/23 next available fill date2/4/25  I called [facility] pharmacy and they said [facility] [place]  PA #/Case ID/Reference #: [id number]

## 2023-05-27 ENCOUNTER — Encounter: Payer: Self-pay | Admitting: Cardiology

## 2023-05-27 ENCOUNTER — Other Ambulatory Visit: Payer: Self-pay | Admitting: Internal Medicine

## 2023-05-27 ENCOUNTER — Ambulatory Visit (HOSPITAL_COMMUNITY): Payer: 59 | Attending: Cardiology

## 2023-05-27 DIAGNOSIS — I5022 Chronic systolic (congestive) heart failure: Secondary | ICD-10-CM

## 2023-05-27 DIAGNOSIS — I427 Cardiomyopathy due to drug and external agent: Secondary | ICD-10-CM

## 2023-05-27 DIAGNOSIS — Z95810 Presence of automatic (implantable) cardiac defibrillator: Secondary | ICD-10-CM

## 2023-05-27 LAB — ECHOCARDIOGRAM COMPLETE
Area-P 1/2: 3.68 cm2
S' Lateral: 3.05 cm

## 2023-05-28 ENCOUNTER — Other Ambulatory Visit: Payer: Self-pay | Admitting: Internal Medicine

## 2023-06-02 ENCOUNTER — Ambulatory Visit: Payer: 59 | Attending: Cardiology | Admitting: Cardiology

## 2023-06-02 ENCOUNTER — Encounter: Payer: Self-pay | Admitting: Cardiology

## 2023-06-02 VITALS — BP 108/72 | HR 101 | Resp 16 | Ht 68.0 in | Wt 174.4 lb

## 2023-06-02 DIAGNOSIS — E7800 Pure hypercholesterolemia, unspecified: Secondary | ICD-10-CM

## 2023-06-02 DIAGNOSIS — I10 Essential (primary) hypertension: Secondary | ICD-10-CM | POA: Diagnosis not present

## 2023-06-02 DIAGNOSIS — I2510 Atherosclerotic heart disease of native coronary artery without angina pectoris: Secondary | ICD-10-CM

## 2023-06-02 DIAGNOSIS — I427 Cardiomyopathy due to drug and external agent: Secondary | ICD-10-CM | POA: Diagnosis not present

## 2023-06-02 MED ORDER — EZETIMIBE 10 MG PO TABS: 10.0000 mg | ORAL_TABLET | Freq: Every day | ORAL | 0 refills | Status: DC

## 2023-06-02 MED ORDER — ASPIRIN 81 MG PO CHEW: 81.0000 mg | CHEWABLE_TABLET | Freq: Every day | ORAL | Status: DC

## 2023-06-02 MED ORDER — ROSUVASTATIN CALCIUM 20 MG PO TABS: 20.0000 mg | ORAL_TABLET | Freq: Every day | ORAL | 0 refills | Status: DC

## 2023-06-02 NOTE — Progress Notes (Signed)
Cardiology Office Note:  .   Date:  06/02/2023  ID:  Rebecca [name], DOB 13-Nov-1970, MRN [id number] PCP: Margaree Mackintosh, MD  [facility] Providers Cardiologist:  Yates Decamp, MD   History of Present Illness: .   Rebecca [name] is a 53 y.o. Caucasian female initially with history of LBBB at least dating back to 2013. Past medical history includes hyperthyroidism S/P RAI therapy in March 2018, depression, DM, estrogen negative right breast cancer S/P Bilateral mastectomy and adjuvant chemotherapy(Adriamycin and Cytoxan x4), developed severe LV systolic dysfunction and due to persistent low EF and dyspnea underwent BV ICD implantation with Medtronic CRT-D on 04/05/2022.  Since being on [facility], patient has lost significant amount of weight and also hemoglobin A1c improved from 11 to 5.8, has lost greater than 100 pounds in weight since 2023 presents here for annual visit.  Discussed the use of AI scribe software for clinical note transcription with the patient, who gave verbal consent to proceed.  History of Present Illness   The patient, with cardiomyopathy secondary to chemotherapy, presents with fatigue and chest pain.  She experiences intermittent chest pain localized near the pacemaker site, with the last episode occurring while washing dishes. The pain is not exertional, does not radiate, and typically lasts about ten minutes, subsiding after taking lorazepam. No chest discomfort occurs during physical activities such as walking.  However her physical activity is not much as per patient.  She has lost 101 pounds since 2023, improving her hemoglobin A1c from 11 to 5.8 since being on Mounjaro for diabetes, maintaining a steady dose of 15 mg, and her weight is stable.  She mentions severe back pain requiring a shot recently and has not engaged in much physical activity due to cold weather and back pain, with minimal walking and some home cleaning.    Labs   Lab Results   Component Value Date   CHOL 261 (H) 10/18/2022   HDL 58 10/18/2022   LDLCALC 175 (H) 10/18/2022   TRIG 139 10/18/2022   CHOLHDL 4.5 10/18/2022   Lab Results  Component Value Date   NA 142 03/10/2023   K 4.8 03/10/2023   CO2 31 03/10/2023   GLUCOSE 91 03/10/2023   BUN 19 03/10/2023   CREATININE 0.92 03/10/2023   CALCIUM 9.2 03/10/2023   EGFR 76 10/18/2022   GFRNONAA >60 03/10/2023      Latest Ref Rng & Units 03/10/2023    3:05 PM 10/18/2022   11:18 AM 05/31/2022   12:17 PM  BMP  Glucose 70 - 99 mg/dL 91  87  562   BUN 6 - 20 mg/dL 19  16  9    Creatinine 0.44 - 1.00 mg/dL 1.30  8.65  7.84   BUN/Creat Ratio 6 - 22 (calc)  SEE NOTE:    Sodium 135 - 145 mmol/L 142  142  139   Potassium 3.5 - 5.1 mmol/L 4.8  5.5  4.8   Chloride 98 - 111 mmol/L 108  105  105   CO2 22 - 32 mmol/L 31  30  29    Calcium 8.9 - 10.3 mg/dL 9.2  9.5  9.3       Latest Ref Rng & Units 03/10/2023    3:05 PM 10/18/2022   11:18 AM 05/31/2022   12:17 PM  CBC  WBC 4.0 - 10.5 K/uL 5.8  5.2  4.0   Hemoglobin 12.0 - 15.0 g/dL 69.6  29.5  28.4   Hematocrit 36.0 - 46.0 %  39.2  40.9  39.9   Platelets 150 - 400 K/uL 224  239  173    Review of Systems  Cardiovascular:  Positive for chest pain (at pacemaker/ICD site). Negative for dyspnea on exertion and leg swelling.   Physical Exam:   VS:  BP 108/72 (BP Location: Left Arm, Patient Position: Sitting, Cuff Size: Normal)   Pulse (!) 101   Resp 16   Ht 5\' 8"  (1.727 m)   Wt 174 lb 6.4 oz (79.1 kg)   LMP  (LMP Unknown)   SpO2 98%   BMI 26.52 kg/m     Wt Readings from Last 3 Encounters:  06/02/23 174 lb 6.4 oz (79.1 kg)  03/21/23 172 lb (78 kg)  03/10/23 172 lb 14.4 oz (78.4 kg)    Physical Exam Neck:     Vascular: No carotid bruit or JVD.  Cardiovascular:     Rate and Rhythm: Normal rate and regular rhythm.     Pulses: Intact distal pulses.     Heart sounds: Normal heart sounds. No murmur heard.    No gallop.  Pulmonary:     Effort: Pulmonary  effort is normal.     Breath sounds: Normal breath sounds.  Abdominal:     General: Bowel sounds are normal.     Palpations: Abdomen is soft.  Musculoskeletal:     Right lower leg: No edema.     Left lower leg: No edema.    Studies Reviewed: Marland Kitchen    Coronary CTA 01/01/2019: 1. Coronary artery calcium score 50 Agatston units. This places the patient in the 97th percentile for age and gender, suggesting high risk for future cardiac events. 2.  Nonobstructive coronary disease. 3. The RCA originates from the left cusp in close proximity to the left coronary and courses interarterially between the aortic root and proximal PA to reach typical RCA territory. Would consider treadmill stress testing to assess for provocative ischemia. 4. LAD system: Mixed plaque in the proximal LAD with mild (<50%) stenosis. Circumflex system: Large PLOM, small AV LCx after take-off of PLOM. No plaque or stenosis.  ECHOCARDIOGRAM COMPLETE 05/27/2023  1. Left ventricular ejection fraction, by estimation, is 55 to 60%. The left ventricle has normal function. The left ventricle has no regional wall motion abnormalities. Left ventricular diastolic parameters are indeterminate. The average left ventricular global longitudinal strain is -21.3 %. The global longitudinal strain is normal. 2. Right ventricular systolic function is normal. The right ventricular size is normal. There is normal pulmonary artery systolic pressure. 3. Left atrial size was moderately dilated. 4. Right atrial size was mildly dilated. 5. The inferior vena cava is normal in size with greater than 50% respiratory variability, suggesting right atrial pressure of 3 mmHg. 6. Cannot exclude a small PFO.  No significant change from 10/21/2022.  Compared to 01/02/2022, EF has improved from 25 to 30%.  BiV ICD in percent check 04/08/2023: Normal CRT-D function, no atrial fibrillation, thoracic impedance at baseline and does not suggest volume overload.  EKG:     EKG Interpretation Date/Time:  Monday June 02 2023 14:55:13 EST Ventricular Rate:  94 PR Interval:  112 QRS Duration:  112 QT Interval:  378 QTC Calculation: 472 R Axis:   47  Text Interpretation: EKG 06/02/2823: Atrially sensed and biventricular paced rhythm at rate of 94 bpm.  No further analysis.  No significant change from 04/15/2022. Confirmed by Delrae Rend [phone]) on 06/02/2023 3:14:53 PM    Medications and allergies    No  Known Allergies   Current Outpatient Medications:    aspirin (ASPIRIN CHILDRENS) 81 MG chewable tablet, Chew 1 tablet (81 mg total) by mouth daily., Disp: , Rfl:    Aspirin-Caffeine (BAYER BACK & BODY) 500-32.5 MG TABS, Take 2 tablets by mouth daily as needed (Pain)., Disp: , Rfl:    EPINEPHrine 0.3 mg/0.3 mL IJ SOAJ injection, Use as directed for life threatening allergic reactions only (Patient taking differently: Inject 0.3 mg into the muscle as needed for anaphylaxis. Use as directed for life threatening allergic reactions only), Disp: 2 each, Rfl: 3   esomeprazole (NEXIUM) 40 MG capsule, TAKE 1 CAPSULE (40 MG TOTAL) BY MOUTH DAILY AT 12 NOON., Disp: 90 capsule, Rfl: 3   ezetimibe (ZETIA) 10 MG tablet, Take 1 tablet (10 mg total) by mouth daily after supper., Disp: 90 tablet, Rfl: 0   furosemide (LASIX) 40 MG tablet, TAKE 1 TABLET BY MOUTH EVERY DAY AS NEEDED FOR FLUID *SHORTNESS OF BREATH*, Disp: 30 tablet, Rfl: 1   ibuprofen (MOTRIN IB) 200 MG tablet, Take 400 mg by mouth every 6 (six) hours as needed for mild pain or moderate pain., Disp: , Rfl:    levothyroxine (SYNTHROID) 125 MCG tablet, Take 1 tablet (125 mcg total) by mouth daily., Disp: 90 tablet, Rfl: 1   LORazepam (ATIVAN) 1 MG tablet, TAKE 1 TABLET BY MOUTH TWICE A DAY AS NEEDED, Disp: 60 tablet, Rfl: 1   meloxicam (MOBIC) 15 MG tablet, Take 15 mg by mouth daily., Disp: , Rfl:    metoprolol succinate (TOPROL-XL) 25 MG 24 hr tablet, Take 1 tablet (25 mg total) by mouth daily. Take with or  immediately following a meal., Disp: 90 tablet, Rfl: 1   montelukast (SINGULAIR) 10 MG tablet, TAKE ONE TABLET BY MOUTH DAILY, Disp: 90 tablet, Rfl: 0   rosuvastatin (CRESTOR) 20 MG tablet, Take 1 tablet (20 mg total) by mouth daily after supper., Disp: 90 tablet, Rfl: 0   sacubitril-valsartan (ENTRESTO) 24-26 MG, Take 1 tablet by mouth 2 (two) times daily., Disp: 180 tablet, Rfl: 1   testosterone cypionate (DEPO-TESTOSTERONE) 200 MG/ML injection, Inject 200 mg into the muscle every 3 (three) months., Disp: , Rfl:    tirzepatide (MOUNJARO) 15 MG/0.5ML Pen, Inject 15 mg into the skin once a week., Disp: 2 mL, Rfl: 6   triamcinolone cream (KENALOG) 0.1 %, Apply 1 Application topically 2 (two) times daily., Disp: , Rfl:    VAGIFEM 10 MCG TABS vaginal tablet, Place 1 tablet vaginally 2 (two) times a week., Disp: , Rfl:    venlafaxine XR (EFFEXOR-XR) 75 MG 24 hr capsule, Take 1 capsule (75 mg total) by mouth daily with breakfast., Disp: 90 capsule, Rfl: 0   ASSESSMENT AND PLAN: .      ICD-10-CM   1. 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 Primary hypertension  I10     3. Hypercholesteremia  E78.00 rosuvastatin (CRESTOR) 20 MG tablet    ezetimibe (ZETIA) 10 MG tablet    Lipid Profile    4. 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 Heart function has normalized with BV ICD. EF improved from 25-30% in 2020 to 55% currently. No  heart failure symptoms or abnormal thoracic impedance or atrial fibrillation on ICD review.   On low doses of Entresto and metoprolol succinate due to previous hypotension and syncope. Discussed the importance of continuing current  medications to maintain heart function. Increasing doses is not feasible due to hypotension risk. - Continue Entresto 24/26 mg BID - Continue metoprolol succinate 25 mg daily  Hypercholesterolemia LDL cholesterol elevated at 175 mg/dL. Coronary CT angiogram from 2020 showed coronary calcification in the 97th percentile, indicating high cardiovascular risk. No current cholesterol-lowering therapy. Discussed the need for cholesterol management to prevent cardiovascular events. Explained benefits of rosuvastatin and ezetimibe, and importance of adherence. - Initiate rosuvastatin 20 mg daily - Initiate ezetimibe 10 mg daily - Check lipid profile in 6-8 weeks - Send prescriptions to [facility] pharmacy  Hypertension (resolved) Hypertension resolved with significant weight loss (101 lbs). Currently not on antihypertensive therapy. Discussed importance of maintaining weight loss and monitoring blood pressure regularly. - Monitor blood pressure regularly  General Health Maintenance Achieved significant weight loss and improved hemoglobin A1c from 11 to 5.8. No current issues with diabetes management. Discussed importance of hydration and regular exercise. Emphasized need to continue current diabetes management with Mounjaro 15 mg. - Encourage regular exercise - Maintain hydration - Continue Mounjaro 15 mg  Follow-up - Follow-up in 6 months for CAD, heart failure, and hypercholesterolemia - Check lipid profile in 6-8 weeks.      Signed,  Yates Decamp, MD, [facility] 06/02/2023, 4:52 PM [facility] [address] [place] [address] [place], [place] [address] Phone: [phone]. Fax:  [phone]

## 2023-06-02 NOTE — Patient Instructions (Addendum)
Medication Instructions:  Your physician has recommended you make the following change in your medication:  Start Rosuvastatin 20 mg by mouth daily Start Zetia 10 mg by mouth daily    *If you need a refill on your cardiac medications before your next appointment, please call your pharmacy*   Lab Work: Have fasting lab work done in 6-8 weeks.  Lipid profile.  Can be done at any [facility] If you have labs (blood work) drawn today and your tests are completely normal, you will receive your results only by: MyChart Message (if you have MyChart) OR A paper copy in the mail If you have any lab test that is abnormal or we need to change your treatment, we will call you to review the results.   Testing/Procedures: none   Follow-Up: At [facility], you and your health needs are our priority.  As part of our continuing mission to provide you with exceptional heart care, we have created designated Provider Care Teams.  These Care Teams include your primary Cardiologist (physician) and Advanced Practice Providers (APPs -  Physician Assistants and Nurse Practitioners) who all work together to provide you with the care you need, when you need it.  We recommend signing up for the patient portal called "MyChart".  Sign up information is provided on this After Visit Summary.  MyChart is used to connect with patients for Virtual Visits (Telemedicine).  Patients are able to view lab/test results, encounter notes, upcoming appointments, etc.  Non-urgent messages can be sent to your provider as well.   To learn more about what you can do with MyChart, go to [email or web address].    Your next appointment:   6 month(s)  Provider:   Yates Decamp, MD     Other Instructions

## 2023-06-06 NOTE — Progress Notes (Addendum)
 Patient Care Team: Sylvan Evener, MD as PCP - General (Internal Medicine) Knox Perl, MD as PCP - Cardiology (Cardiology) Liane Redman Jaynie Meyers, MD (Internal Medicine)  Visit Date: 06/09/23  Subjective:   Chief Complaint  Patient presents with   Anxiety   Situational stress   Grief   Patient WU:[id number] Gay Dahm,Female DOB:Jan 19, 1971,52 y.o. GNF:[id number]   53 y.o. Female presents today  for anxiety and situational stress.  Currently treated with 75 mg Effexor -XR daily and 1 mg Ativan  BID as needed. Today  reports that she has been dealing with situational stress and grief as her dog unfortunately passed away at home unexpectedly after being seen at veterinarian's office.  Also, she is  still trying to obtain disability benefits. Job with [employer] was eliminated some time ago. Also, says she has also been cleaning out her father's home, who passed away last year. Daughter would like to attend college in [place] .Patient has seen Dr. Ellery Guthrie who wrote a letter for her supportive of her receiving  disability benefits as did Dr. Belma Boxer.Hx of right breast cancer seen and followed by Dr. Lee Public, Oncologist. She says he also is supportive of her receiving disability benefits.  She saw Dr. Ellery Guthrie 05/20/2023 and he noted a 6 mm anterior listhesis at L4-L5 in flexion and 3 mm of anterior lateral listhesis and extension.  She also had facet arthropathy at L4-L5 on CT noted in 2024.  When he saw her in January 21 he did an epidural steroid injection.  She has a hx of Type 2 Diabetes mellitus and is on Mounjaro . Hgb AIC has normalized on this medication. In 2021, Hgb AIC was 8%.TSH was normal in September 2024.  Past Medical History:  Diagnosis Date   Acute respiratory disease due to COVID-19 virus 12/25/2019   Allergy    constant hives post chemo therapy.  All allergen tests have been neg.   Anxiety    Breast cancer (HCC)    Coronary artery disease    CRTD Medtronic COBALT XT CRT D  04/05/2022 04/05/2022   Diabetes mellitus without complication (HCC)    Diverticulitis    Encounter for assessment of implantable cardioverter-defibrillator (ICD) 04/06/2022   GERD (gastroesophageal reflux disease)    Heart failure (HCC) 01/02/2022   EF 25 to 30%   Hiatal hernia    Hyperlipidemia    Hypertension    Hypothyroidism    LBBB (left bundle branch block)    Pneumonia     No Known Allergies  Family History  Problem Relation Age of Onset   Lymphoma Father    Cancer Father        lymphoma   Diabetes Brother    Diabetes Mother    Liver disease Mother        NASH, d. 69   Asthma Mother    CAD Maternal Grandmother    CAD Maternal Grandfather    Stomach cancer Neg Hx    Colon cancer Neg Hx    Colon polyps Neg Hx    Esophageal cancer Neg Hx    Rectal cancer Neg Hx    Social history: Married.  2 children a son and a daughter.  Review of Systems  Psychiatric/Behavioral:  Positive for depression. Negative for suicidal ideas. The patient is nervous/anxious.      Objective:  Vitals: BP 120/80   Pulse 74   Ht 5\' 8"  (1.727 m)   Wt 176 lb (79.8 kg)   LMP  (LMP Unknown)   SpO2 96%  BMI 26.76 kg/m   Physical Exam Vitals and nursing note reviewed.  Constitutional:      General: She is not in acute distress.    Appearance: Normal appearance. She is not toxic-appearing.  HENT:     Head: Normocephalic and atraumatic.  Pulmonary:     Effort: Pulmonary effort is normal.  Skin:    General: Skin is warm and dry.  Neurological:     Mental Status: She is alert and oriented to person, place, and time. Mental status is at baseline.  Psychiatric:        Behavior: Behavior normal.        Thought Content: Thought content normal.        Judgment: Judgment normal.     Results:  Studies Obtained And Personally Reviewed By Me: Labs:     Component Value Date/Time   NA 142 03/10/2023 1505   NA 140 04/02/2022 1008   K 4.8 03/10/2023 1505   CL 108 03/10/2023 1505   CO2  31 03/10/2023 1505   GLUCOSE 91 03/10/2023 1505   BUN 19 03/10/2023 1505   BUN 19 04/02/2022 1008   CREATININE 0.92 03/10/2023 1505   CREATININE 0.91 10/18/2022 1118   CALCIUM  9.2 03/10/2023 1505   PROT 6.4 (L) 03/10/2023 1505   PROT 6.4 05/11/2020 0816   ALBUMIN  4.2 03/10/2023 1505   ALBUMIN  4.3 05/11/2020 0816   AST 11 (L) 03/10/2023 1505   ALT 9 03/10/2023 1505   ALKPHOS 55 03/10/2023 1505   BILITOT 0.3 03/10/2023 1505   GFRNONAA >60 03/10/2023 1505   GFRNONAA 83 01/12/2020 1130   GFRAA 86 05/11/2020 0816   GFRAA 97 01/12/2020 1130    Lab Results  Component Value Date   WBC 5.8 03/10/2023   HGB 12.5 03/10/2023   HCT 39.2 03/10/2023   MCV 86.5 03/10/2023   PLT 224 03/10/2023   Lab Results  Component Value Date   CHOL 261 (H) 10/18/2022   HDL 58 10/18/2022   LDLCALC 175 (H) 10/18/2022   TRIG 139 10/18/2022   CHOLHDL 4.5 10/18/2022   Lab Results  Component Value Date   HGBA1C 5.5 10/18/2022    Lab Results  Component Value Date   TSH 1.43 01/23/2023    Assessment & Plan:  Cardiomyopathy, Secondary to Drug; Chronic Systolic Heart Failure: treated with ICD (implantable cardioverter-defibrillator) since 03/2022. Followed by Knox Perl, MD, whom she most recently saw on 06/02/23.   Hx of Breast Cancer requiring chemotherapy.  Currently in remission.  Followed by Dr. Gudena, Oncologist  History of type 2 diabetes treated with Mounjaro .  Hemoglobin A1c in June 2024 was 5.5%.  Prior to her starting Mounjaro  hemoglobin A1c was as high as 8% in 2021.  Anxiety treated with 75 mg Effexor -XR daily and 1 mg Ativan  BID as needed.   Situational Stress; Grief as her dog unfortunately passed away recently.  This was unexpected.  Admits to significant anxiety and worry due to lack of progress obtaining disability benefits. She is currently dealing with trying to get full disability benefits due to multiple medical issues which are chronic, ongoing and significant,.  This has been a  difficult and slow-going process for her.  She notes she has also been cleaning out her deceased father's home and anticipates that will sell in the near future.  Also has situational stress at times with husband.   Will be returning here for fasting lipid panel here on 06/13/23.   I,Emily Lagle,acting as a Neurosurgeon for [employer]  Rosi Converse, MD.,have documented all relevant documentation on the behalf of Sylvan Evener, MD,as directed by  Sylvan Evener, MD while in the presence of Sylvan Evener, MD.  I, Sylvan Evener, MD, have reviewed all documentation for this visit. The documentation on 06/09/23 for the exam, diagnosis, procedures, and orders are all accurate and complete.

## 2023-06-09 ENCOUNTER — Ambulatory Visit: Payer: 59 | Admitting: Internal Medicine

## 2023-06-09 ENCOUNTER — Encounter: Payer: Self-pay | Admitting: Internal Medicine

## 2023-06-09 VITALS — BP 120/80 | HR 74 | Ht 68.0 in | Wt 176.0 lb

## 2023-06-09 DIAGNOSIS — F439 Reaction to severe stress, unspecified: Secondary | ICD-10-CM | POA: Insufficient documentation

## 2023-06-09 DIAGNOSIS — Z853 Personal history of malignant neoplasm of breast: Secondary | ICD-10-CM | POA: Diagnosis not present

## 2023-06-09 DIAGNOSIS — I5022 Chronic systolic (congestive) heart failure: Secondary | ICD-10-CM | POA: Diagnosis not present

## 2023-06-09 DIAGNOSIS — I427 Cardiomyopathy due to drug and external agent: Secondary | ICD-10-CM | POA: Diagnosis not present

## 2023-06-09 DIAGNOSIS — F4321 Adjustment disorder with depressed mood: Secondary | ICD-10-CM

## 2023-06-09 DIAGNOSIS — Z9889 Other specified postprocedural states: Secondary | ICD-10-CM | POA: Diagnosis not present

## 2023-06-09 NOTE — Patient Instructions (Signed)
 We are sorry to hear about your situational stress which was discussed at length today.  Please return in February 14 for fasting lipid panel.

## 2023-06-13 ENCOUNTER — Other Ambulatory Visit: Payer: 59

## 2023-06-13 DIAGNOSIS — E7800 Pure hypercholesterolemia, unspecified: Secondary | ICD-10-CM

## 2023-06-13 DIAGNOSIS — E119 Type 2 diabetes mellitus without complications: Secondary | ICD-10-CM

## 2023-06-14 LAB — HEMOGLOBIN A1C
Hgb A1c MFr Bld: 5.7 %{Hb} — ABNORMAL HIGH (ref <5.7)
Mean Plasma Glucose: 117 mg/dL
eAG (mmol/L): 6.5 mmol/L

## 2023-06-14 LAB — LIPID PANEL
Cholesterol: 145 mg/dL (ref <200)
HDL: 74 mg/dL (ref >=50)
LDL Cholesterol (Calc): 57 mg/dL
Non-HDL Cholesterol (Calc): 71 mg/dL (ref <130)
Total CHOL/HDL Ratio: 2 (calc) (ref <5.0)
Triglycerides: 63 mg/dL (ref <150)

## 2023-06-19 ENCOUNTER — Encounter: Payer: Self-pay | Admitting: *Deleted

## 2023-06-19 DIAGNOSIS — N644 Mastodynia: Secondary | ICD-10-CM

## 2023-06-30 ENCOUNTER — Ambulatory Visit
Admission: RE | Admit: 2023-06-30 | Discharge: 2023-06-30 | Disposition: A | Discharge status: Home / Self Care | Payer: 59 | Source: Ambulatory Visit | Attending: Hematology and Oncology | Admitting: Hematology and Oncology

## 2023-06-30 DIAGNOSIS — N644 Mastodynia: Secondary | ICD-10-CM

## 2023-07-07 ENCOUNTER — Ambulatory Visit: Payer: 59

## 2023-07-07 DIAGNOSIS — I427 Cardiomyopathy due to drug and external agent: Secondary | ICD-10-CM | POA: Diagnosis not present

## 2023-07-07 DIAGNOSIS — I5022 Chronic systolic (congestive) heart failure: Secondary | ICD-10-CM

## 2023-07-08 ENCOUNTER — Encounter: Payer: Self-pay | Admitting: Internal Medicine

## 2023-07-08 ENCOUNTER — Other Ambulatory Visit: Payer: Self-pay | Admitting: Internal Medicine

## 2023-07-08 LAB — CUP PACEART REMOTE DEVICE CHECK
Battery Remaining Longevity: 90 mo
Battery Voltage: 2.98 V
Brady Statistic RV Percent Paced: 99.24 %
Date Time Interrogation Session: 20250307192330
HighPow Impedance: 64 Ohm
Implantable Lead Connection Status: 753985
Implantable Lead Connection Status: 753985
Implantable Lead Connection Status: 753985
Implantable Lead Implant Date: 20231208
Implantable Lead Implant Date: 20231208
Implantable Lead Implant Date: 20231208
Implantable Lead Location: 753858
Implantable Lead Location: 753859
Implantable Lead Location: 753860
Implantable Lead Model: 5076
Implantable Lead Model: 6935
Implantable Pulse Generator Implant Date: 20231208
Lead Channel Impedance Value: 266 Ohm
Lead Channel Impedance Value: 285 Ohm
Lead Channel Impedance Value: 399 Ohm
Lead Channel Impedance Value: 437 Ohm
Lead Channel Impedance Value: 494 Ohm
Lead Channel Impedance Value: 513 Ohm
Lead Channel Pacing Threshold Amplitude: 0.625 V
Lead Channel Pacing Threshold Amplitude: 0.625 V
Lead Channel Pacing Threshold Amplitude: 0.625 V
Lead Channel Pacing Threshold Pulse Width: 0.4 ms
Lead Channel Pacing Threshold Pulse Width: 0.4 ms
Lead Channel Pacing Threshold Pulse Width: 0.4 ms
Lead Channel Sensing Intrinsic Amplitude: 14.3 mV
Lead Channel Sensing Intrinsic Amplitude: 3.8 mV
Lead Channel Setting Pacing Amplitude: 2 V
Lead Channel Setting Pacing Amplitude: 2 V
Lead Channel Setting Pacing Amplitude: 2 V
Lead Channel Setting Pacing Pulse Width: 0.4 ms
Lead Channel Setting Pacing Pulse Width: 0.4 ms
Lead Channel Setting Sensing Sensitivity: 0.3 mV
Zone Setting Status: 755011
Zone Setting Status: 755011

## 2023-07-15 ENCOUNTER — Other Ambulatory Visit: Payer: Self-pay | Admitting: Hematology and Oncology

## 2023-07-16 ENCOUNTER — Other Ambulatory Visit: Payer: Self-pay | Admitting: Cardiology

## 2023-07-16 DIAGNOSIS — I5022 Chronic systolic (congestive) heart failure: Secondary | ICD-10-CM

## 2023-07-31 ENCOUNTER — Encounter: Payer: Self-pay | Admitting: Cardiology

## 2023-07-31 DIAGNOSIS — E1169 Type 2 diabetes mellitus with other specified complication: Secondary | ICD-10-CM

## 2023-07-31 MED ORDER — TIRZEPATIDE 15 MG/0.5ML ~~LOC~~ SOAJ: 15.0000 mg | SUBCUTANEOUS | 6 refills | Status: DC

## 2023-08-07 NOTE — Addendum Note (Signed)
 Addended by: Geralyn Flash D on: 08/07/2023 04:05 PM   Modules accepted: Orders

## 2023-08-07 NOTE — Progress Notes (Signed)
 Remote ICD transmission.

## 2023-08-11 ENCOUNTER — Encounter: Payer: Self-pay | Admitting: Internal Medicine

## 2023-08-11 ENCOUNTER — Telehealth: Payer: Self-pay

## 2023-08-11 NOTE — Telephone Encounter (Signed)
 Patient wants all her medication sent [facility].

## 2023-08-11 NOTE — Telephone Encounter (Signed)
 Patient is requesting all medication sent to [facility]. Patient had [facility] fill this [facility] this time.

## 2023-08-25 ENCOUNTER — Other Ambulatory Visit: Payer: Self-pay | Admitting: Internal Medicine

## 2023-08-25 ENCOUNTER — Other Ambulatory Visit: Payer: Self-pay

## 2023-08-25 DIAGNOSIS — F439 Reaction to severe stress, unspecified: Secondary | ICD-10-CM

## 2023-08-25 MED ORDER — VENLAFAXINE HCL ER 75 MG PO CP24: 75.0000 mg | ORAL_CAPSULE | Freq: Every day | ORAL | 0 refills | Status: DC

## 2023-08-26 ENCOUNTER — Encounter: Payer: Self-pay | Admitting: Cardiology

## 2023-09-22 ENCOUNTER — Other Ambulatory Visit: Payer: Self-pay | Admitting: Internal Medicine

## 2023-09-30 ENCOUNTER — Encounter: Payer: Self-pay | Admitting: Cardiology

## 2023-09-30 DIAGNOSIS — E1169 Type 2 diabetes mellitus with other specified complication: Secondary | ICD-10-CM

## 2023-09-30 DIAGNOSIS — I5022 Chronic systolic (congestive) heart failure: Secondary | ICD-10-CM

## 2023-09-30 DIAGNOSIS — I427 Cardiomyopathy due to drug and external agent: Secondary | ICD-10-CM

## 2023-09-30 MED ORDER — METOPROLOL TARTRATE 100 MG PO TABS: ORAL_TABLET | ORAL | 0 refills | Status: DC

## 2023-09-30 NOTE — Telephone Encounter (Signed)
 Rebecca [name], are you able to place coronary CTA orders, indication angina pectoris, Diabetes mellitus without complications

## 2023-10-01 ENCOUNTER — Telehealth: Payer: Self-pay

## 2023-10-01 NOTE — Telephone Encounter (Signed)
 Called pt per MD to advise [facility] testing was negative/not detected. Pt verbalized understanding of results and knows [facility] will be in touch to schedule 6 mo repeat lab.

## 2023-10-02 ENCOUNTER — Encounter: Payer: Self-pay | Admitting: Hematology and Oncology

## 2023-10-06 ENCOUNTER — Ambulatory Visit (INDEPENDENT_AMBULATORY_CARE_PROVIDER_SITE_OTHER): Payer: 59

## 2023-10-06 DIAGNOSIS — I427 Cardiomyopathy due to drug and external agent: Secondary | ICD-10-CM

## 2023-10-06 DIAGNOSIS — I5022 Chronic systolic (congestive) heart failure: Secondary | ICD-10-CM

## 2023-10-07 LAB — CUP PACEART REMOTE DEVICE CHECK
Battery Remaining Longevity: 85 mo
Battery Voltage: 2.98 V
Brady Statistic RV Percent Paced: 99.34 %
Date Time Interrogation Session: 20250608232656
HighPow Impedance: 68 Ohm
Implantable Lead Connection Status: 753985
Implantable Lead Connection Status: 753985
Implantable Lead Connection Status: 753985
Implantable Lead Implant Date: 20231208
Implantable Lead Implant Date: 20231208
Implantable Lead Implant Date: 20231208
Implantable Lead Location: 753858
Implantable Lead Location: 753859
Implantable Lead Location: 753860
Implantable Lead Model: 5076
Implantable Lead Model: 6935
Implantable Pulse Generator Implant Date: 20231208
Lead Channel Impedance Value: 285 Ohm
Lead Channel Impedance Value: 285 Ohm
Lead Channel Impedance Value: 361 Ohm
Lead Channel Impedance Value: 380 Ohm
Lead Channel Impedance Value: 456 Ohm
Lead Channel Impedance Value: 532 Ohm
Lead Channel Pacing Threshold Amplitude: 0.625 V
Lead Channel Pacing Threshold Amplitude: 0.625 V
Lead Channel Pacing Threshold Amplitude: 0.625 V
Lead Channel Pacing Threshold Pulse Width: 0.4 ms
Lead Channel Pacing Threshold Pulse Width: 0.4 ms
Lead Channel Pacing Threshold Pulse Width: 0.4 ms
Lead Channel Sensing Intrinsic Amplitude: 12.9 mV
Lead Channel Sensing Intrinsic Amplitude: 3.5 mV
Lead Channel Setting Pacing Amplitude: 2 V
Lead Channel Setting Pacing Amplitude: 2 V
Lead Channel Setting Pacing Amplitude: 2 V
Lead Channel Setting Pacing Pulse Width: 0.4 ms
Lead Channel Setting Pacing Pulse Width: 0.4 ms
Lead Channel Setting Sensing Sensitivity: 0.3 mV
Zone Setting Status: 755011
Zone Setting Status: 755011

## 2023-10-09 ENCOUNTER — Ambulatory Visit: Payer: Self-pay | Admitting: Cardiology

## 2023-10-09 LAB — BASIC METABOLIC PANEL WITH GFR
BUN/Creatinine Ratio: 19 (ref 9–23)
BUN: 14 mg/dL (ref 6–24)
CO2: 21 mmol/L (ref 20–29)
Calcium: 9.5 mg/dL (ref 8.7–10.2)
Chloride: 103 mmol/L (ref 96–106)
Creatinine, Ser: 0.75 mg/dL (ref 0.57–1.00)
Glucose: 132 mg/dL — ABNORMAL HIGH (ref 70–99)
Potassium: 5 mmol/L (ref 3.5–5.2)
Sodium: 142 mmol/L (ref 134–144)
eGFR: 95 mL/min/1.73

## 2023-10-09 NOTE — Progress Notes (Signed)
 Labs stable for coronary CTA

## 2023-10-10 ENCOUNTER — Ambulatory Visit: Payer: Self-pay | Admitting: Internal Medicine

## 2023-10-14 ENCOUNTER — Telehealth (HOSPITAL_COMMUNITY): Payer: Self-pay | Admitting: Emergency Medicine

## 2023-10-14 NOTE — Telephone Encounter (Signed)
 Attempted to call patient regarding upcoming cardiac CT appointment. Left message on voicemail with name and callback number Rockwell Alexandria RN Navigator Cardiac [facility] [phone] Office [phone] Cell

## 2023-10-15 ENCOUNTER — Ambulatory Visit (HOSPITAL_COMMUNITY)
Admission: RE | Admit: 2023-10-15 | Discharge: 2023-10-15 | Disposition: A | Discharge status: Home / Self Care | Source: Ambulatory Visit | Attending: Cardiology | Admitting: Cardiology

## 2023-10-15 DIAGNOSIS — I2510 Atherosclerotic heart disease of native coronary artery without angina pectoris: Secondary | ICD-10-CM | POA: Diagnosis not present

## 2023-10-15 DIAGNOSIS — E669 Obesity, unspecified: Secondary | ICD-10-CM | POA: Insufficient documentation

## 2023-10-15 DIAGNOSIS — Z95810 Presence of automatic (implantable) cardiac defibrillator: Secondary | ICD-10-CM | POA: Diagnosis not present

## 2023-10-15 DIAGNOSIS — E1169 Type 2 diabetes mellitus with other specified complication: Secondary | ICD-10-CM | POA: Insufficient documentation

## 2023-10-15 DIAGNOSIS — I427 Cardiomyopathy due to drug and external agent: Secondary | ICD-10-CM | POA: Insufficient documentation

## 2023-10-15 DIAGNOSIS — I5022 Chronic systolic (congestive) heart failure: Secondary | ICD-10-CM | POA: Diagnosis present

## 2023-10-15 MED ORDER — IOHEXOL 350 MG/ML SOLN
100.0000 mL | Freq: Once | INTRAVENOUS | Status: AC | PRN
  Administered 2023-10-15: 100 mL via INTRAVENOUS

## 2023-10-15 MED ORDER — NITROGLYCERIN 0.4 MG SL SUBL
0.8000 mg | SUBLINGUAL_TABLET | Freq: Once | SUBLINGUAL | Status: AC
  Administered 2023-10-15: 0.8 mg via SUBLINGUAL

## 2023-10-23 ENCOUNTER — Ambulatory Visit
Admission: EM | Admit: 2023-10-23 | Discharge: 2023-10-23 | Disposition: A | Discharge status: Home / Self Care | Attending: Family Medicine | Admitting: Family Medicine

## 2023-10-23 ENCOUNTER — Other Ambulatory Visit: Payer: Self-pay

## 2023-10-23 DIAGNOSIS — H6591 Unspecified nonsuppurative otitis media, right ear: Secondary | ICD-10-CM

## 2023-10-23 MED ORDER — FLUTICASONE PROPIONATE 50 MCG/ACT NA SUSP: 2.0000 | Freq: Every day | NASAL | 0 refills | Status: DC

## 2023-10-23 MED ORDER — AMOXICILLIN-POT CLAVULANATE 875-125 MG PO TABS: 1.0000 | ORAL_TABLET | Freq: Two times a day (BID) | ORAL | 0 refills | Status: DC

## 2023-10-23 NOTE — Discharge Instructions (Signed)
 Make sure you are drinking lots of water Use Flonase every day until your ear symptoms have resolved Take Augmentin 2 times a day for a week Consider taking a probiotic while on Augmentin See your doctor if not better by next week

## 2023-10-23 NOTE — ED Triage Notes (Signed)
 Right ear ache x 3 days. No fever. No otc meds.

## 2023-10-23 NOTE — ED Provider Notes (Signed)
 [facility]    CSN: [id number] Arrival date & time: 10/23/23  0825      History   Chief Complaint Chief Complaint  Patient presents with   Ear Fullness    HPI Sarissa Dern is a 53 y.o. female.   Patient has right ear pain that is gradually worsening over the last 3 days. n she is not usually prone to ear infections.  Has a history of mild allergies.  No fever or chills.  No cough.  Recent swimming on vacation.    Past Medical History:  Diagnosis Date   Acute respiratory disease due to COVID-19 virus 12/25/2019   Allergy    constant hives post chemo therapy.  All allergen tests have been neg.   Anxiety    Breast cancer (HCC)    Coronary artery disease    CRTD Medtronic COBALT XT CRT D 04/05/2022 04/05/2022   Diabetes mellitus without complication (HCC)    Diverticulitis    Encounter for assessment of implantable cardioverter-defibrillator (ICD) 04/06/2022   GERD (gastroesophageal reflux disease)    Heart failure (HCC) 01/02/2022   EF 25 to 30%   Hiatal hernia    Hyperlipidemia    Hypertension    Hypothyroidism    LBBB (left bundle branch block)    Pneumonia     Patient Active Problem List   Diagnosis Date Noted   Situational stress 06/09/2023   Herniated nucleus pulposus, L5-S1 05/31/2022   Encounter for assessment of implantable cardioverter-defibrillator (ICD) 04/06/2022   ICD (implantable cardioverter-defibrillator) in place 04/05/2022   Chronic systolic heart failure (HCC) 04/02/2022   Hypertension 04/02/2022   Abnormal CT scan, colon 03/19/2022   Herniated nucleus pulposus, lumbar 02/08/2022   Type 2 diabetes mellitus with obesity (HCC) 12/25/2019   Abdominal pain, epigastric 10/01/2019   Breast cancer, right (HCC) 11/18/2017   Genetic testing 08/19/2017   Port-A-Cath in place 08/08/2017   Malignant neoplasm of upper-outer quadrant of right breast in female, estrogen receptor negative (HCC) 07/14/2017   Dependent edema 10/23/2015    Episodic paroxysmal anxiety disorder 10/23/2015   Attention deficit disorder 10/23/2015   Low TSH level 10/23/2015   Obesity 10/23/2015   History of abdominoplasty 10/23/2015   Chest pain 09/09/2011    Past Surgical History:  Procedure Laterality Date   BIV ICD INSERTION CRT-D N/A 04/05/2022   Procedure: BIV ICD INSERTION CRT-D;  Surgeon: Waddell Danelle ORN, MD;  Location: [facility];  Service: Cardiovascular;  Laterality: N/A;   BREAST RECONSTRUCTION WITH PLACEMENT OF TISSUE EXPANDER AND ALLODERM Bilateral 11/18/2017   Procedure: BILATERAL BREAST RECONSTRUCTION WITH PLACEMENT OF TISSUE EXPANDER AND ALLODERM;  Surgeon: Arelia Filippo, MD;  Location: [facility];  Service: Plastics;  Laterality: Bilateral;   CESAREAN SECTION  2007/2010   x2   COLONOSCOPY  2017   COLONOSCOPY WITH PROPOFOL  N/A 03/19/2022   Procedure: COLONOSCOPY WITH PROPOFOL ;  Surgeon: Albertus Gordy HERO, MD;  Location: [facility];  Service: Gastroenterology;  Laterality: N/A;   LAPAROSCOPIC TUBAL LIGATION  06/12/2011   Procedure: LAPAROSCOPIC TUBAL LIGATION;  Surgeon: Rosaline DELENA Luna, MD;  Location: [facility];  Service: Gynecology;  Laterality: N/A;  filshie clip   LIPOSUCTION WITH LIPOFILLING Bilateral 02/24/2018   Procedure: LIPOFILLING FROM ABDOMEN TO BILATERAL CHEST;  Surgeon: Arelia Filippo, MD;  Location:  SURGERY CENTER;  Service: Plastics;  Laterality: Bilateral;   LUMBAR LAMINECTOMY/ DECOMPRESSION WITH MET-RX Left 02/08/2022   Procedure: Left Lumbar Five-Sacral One Microdiscectomy with Metrex;  Surgeon: Colon,  Victory, MD;  Location: [facility];  Service: Neurosurgery;  Laterality: Left;  3C/RM 20   LUMBAR LAMINECTOMY/ DECOMPRESSION WITH MET-RX Left 05/31/2022   Procedure: Left Lumbar Five-Sacral One Microdiscectomy;  Surgeon: Colon Victory, MD;  Location: [facility];  Service: Neurosurgery;  Laterality: Left;  Patient being admitted after having myelogram/ct this morning   MASTECTOMY W/ SENTINEL  NODE BIOPSY Bilateral 11/18/2017   Procedure: BILATERAL TOTAL MASTECTOMIES WITH RIGHT SENTINEL LYMPH NODE BIOPSY;  Surgeon: Ebbie Cough, MD;  Location: [facility];  Service: General;  Laterality: Bilateral;   mini tuck  2012   abdominal   PORT-A-CATH REMOVAL Right 01/05/2019   PORTACATH PLACEMENT Right 07/15/2017   Procedure: INSERTION PORT-A-CATH WITH ULTRASOUND;  Surgeon: Ebbie Cough, MD;  Location: [facility];  Service: General;  Laterality: Right;   REMOVAL OF BILATERAL TISSUE EXPANDERS WITH PLACEMENT OF BILATERAL BREAST IMPLANTS Bilateral 02/24/2018   Procedure: REMOVAL OF BILATERAL TISSUE EXPANDERS WITH PLACEMENT OF BILATERAL BREAST IMPLANTS;  Surgeon: Arelia Filippo, MD;  Location: [facility];  Service: Plastics;  Laterality: Bilateral;   right breast cancer     upper-outer quadrant    right ear surg  03/2008   Mohs   TUBAL LIGATION     US  ECHOCARDIOGRAPHY  12-30-2007   EF 55-60%   WISDOM TOOTH EXTRACTION      OB History   No obstetric history on file.      Home Medications    Prior to Admission medications   Medication Sig Start Date End Date Taking? Authorizing Provider  amoxicillin-clavulanate (AUGMENTIN) 875-125 MG tablet Take 1 tablet by mouth every 12 (twelve) hours. 10/23/23  Yes Maranda Jamee Jacob, MD  fluticasone (FLONASE) 50 MCG/ACT nasal spray Place 2 sprays into both nostrils daily. 10/23/23  Yes Maranda Jamee Jacob, MD  aspirin  (ASPIRIN  CHILDRENS) 81 MG chewable tablet Chew 1 tablet (81 mg total) by mouth daily. 06/02/23   Ganji, Jay, MD  Aspirin -Caffeine (BAYER BACK & BODY) 500-32.5 MG TABS Take 2 tablets by mouth daily as needed (Pain).    [provider]  EPINEPHrine  0.3 mg/0.3 mL IJ SOAJ injection Use as directed for life threatening allergic reactions only Patient taking differently: Inject 0.3 mg into the muscle as needed for anaphylaxis. Use as directed for life threatening allergic reactions  only 07/26/19   Kozlow, Camellia PARAS, MD  esomeprazole  (NEXIUM ) 40 MG capsule TAKE 1 CAPSULE (40 MG TOTAL) BY MOUTH DAILY AT 12 NOON. 07/15/23   Gudena, Vinay, MD  ezetimibe  (ZETIA ) 10 MG tablet Take 1 tablet (10 mg total) by mouth daily after supper. 06/02/23 08/31/23  Ladona Heinz, MD  furosemide  (LASIX ) 40 MG tablet TAKE 1 TABLET BY MOUTH EVERY DAY AS NEEDED FOR FLUID *SHORTNESS OF BREATH* 05/08/22   Ladona Heinz, MD  ibuprofen (MOTRIN IB) 200 MG tablet Take 400 mg by mouth every 6 (six) hours as needed for mild pain or moderate pain.    [provider]  levothyroxine  (SYNTHROID ) 125 MCG tablet Take 1 tablet (125 mcg total) by mouth daily. 05/15/23   Perri Ronal PARAS, MD  LORazepam  (ATIVAN ) 1 MG tablet TAKE 1 TABLET BY MOUTH TWICE A DAY AS NEEDED 09/23/23   Perri Ronal PARAS, MD  meloxicam (MOBIC) 15 MG tablet Take 15 mg by mouth daily. 02/06/22   [provider]  metoprolol  succinate (TOPROL -XL) 25 MG 24 hr tablet Take 1 tablet (25 mg total) by mouth daily. Take with or immediately following a meal. 07/17/23   Ganji,  Gordy, MD  metoprolol  tartrate (LOPRESSOR ) 100 MG tablet Take one tablet two hours prior to cardiac CTA 09/30/23   Ladona Gordy, MD  montelukast  (SINGULAIR ) 10 MG tablet TAKE ONE TABLET BY MOUTH DAILY 07/08/23   Perri Ronal PARAS, MD  rosuvastatin  (CRESTOR ) 20 MG tablet Take 1 tablet (20 mg total) by mouth daily after supper. 06/02/23 08/31/23  Ladona Gordy, MD  sacubitril -valsartan  (ENTRESTO ) 24-26 MG Take 1 tablet by mouth 2 (two) times daily. 03/03/23   Ganji, Jay, MD  testosterone cypionate (DEPO-TESTOSTERONE) 200 MG/ML injection Inject 200 mg into the muscle every 3 (three) months. 02/04/22   [provider]  tirzepatide  (MOUNJARO ) 15 MG/0.5ML Pen Inject 15 mg into the skin once a week. 07/31/23   Ladona Gordy, MD  triamcinolone cream (KENALOG) 0.1 % Apply 1 Application topically 2 (two) times daily.    [provider]  VAGIFEM 10 MCG TABS vaginal tablet Place 1 tablet vaginally 2 (two)  times a week. 02/06/22   [provider]  venlafaxine  XR (EFFEXOR -XR) 75 MG 24 hr capsule Take 1 capsule (75 mg total) by mouth daily with breakfast. 08/25/23   Baxley, Ronal PARAS, MD    Family History Family History  Problem Relation Age of Onset   Lymphoma Father    Cancer Father        lymphoma   Diabetes Brother    Diabetes Mother    Liver disease Mother        NASH, d. 37   Asthma Mother    CAD Maternal Grandmother    CAD Maternal Grandfather    Stomach cancer Neg Hx    Colon cancer Neg Hx    Colon polyps Neg Hx    Esophageal cancer Neg Hx    Rectal cancer Neg Hx     Social History Social History   Tobacco Use   Smoking status: Former    Current packs/day: 0.00    Average packs/day: 0.3 packs/day for 6.0 years (1.5 ttl pk-yrs)    Types: Cigarettes    Start date: 03/30/1999    Quit date: 03/29/2005    Years since quitting: 18.5   Smokeless tobacco: Never  Vaping Use   Vaping status: Never Used  Substance Use Topics   Alcohol use: Yes    Comment: socially   Drug use: No     Allergies   Patient has no known allergies.   Review of Systems Review of Systems See HPI  Physical Exam Triage Vital Signs ED Triage Vitals  Encounter Vitals Group     BP 10/23/23 0849 117/82     Girls Systolic BP Percentile --      Girls Diastolic BP Percentile --      Boys Systolic BP Percentile --      Boys Diastolic BP Percentile --      Pulse Rate 10/23/23 0849 92     Resp 10/23/23 0849 16     Temp 10/23/23 0849 98.8 F (37.1 C)     Temp src --      SpO2 10/23/23 0849 99 %     Weight --      Height --      Head Circumference --      Peak Flow --      Pain Score 10/23/23 0852 8     Pain Loc --      Pain Education --      Exclude from Growth Chart --    No data found.  Updated Vital  Signs BP 117/82   Pulse 92   Temp 98.8 F (37.1 C)   Resp 16   LMP  (LMP Unknown)   SpO2 99%       Physical Exam Constitutional:      General: She is not in acute  distress.    Appearance: She is well-developed.  HENT:     Head: Normocephalic and atraumatic.     Left Ear: Tympanic membrane and ear canal normal.     Ears:     Comments: Right TM is bulging.  Yellow fluid visible.  Light injection    Nose: No congestion or rhinorrhea.     Mouth/Throat:     Pharynx: No posterior oropharyngeal erythema.   Eyes:     Conjunctiva/sclera: Conjunctivae normal.     Pupils: Pupils are equal, round, and reactive to light.    Cardiovascular:     Rate and Rhythm: Normal rate.  Pulmonary:     Effort: Pulmonary effort is normal. No respiratory distress.   Musculoskeletal:        General: Normal range of motion.     Cervical back: Normal range of motion.  Lymphadenopathy:     Cervical: No cervical adenopathy.   Skin:    General: Skin is warm and dry.   Neurological:     Mental Status: She is alert.      UC Treatments / Results  Labs (all labs ordered are listed, but only abnormal results are displayed) Labs Reviewed - No data to display  EKG   Radiology No results found.  Procedures Procedures (including critical care time)  Medications Ordered in UC Medications - No data to display  Initial Impression / Assessment and Plan / UC Course  I have reviewed the triage vital signs and the nursing notes.  Pertinent labs & imaging results that were available during my care of the patient were reviewed by me and considered in my medical decision making (see chart for details).     Final Clinical Impressions(s) / UC Diagnoses   Final diagnoses:  Right otitis media with effusion     Discharge Instructions      Make sure you are drinking lots of water Use Flonase every day until your ear symptoms have resolved Take Augmentin 2 times a day for a week Consider taking a probiotic while on Augmentin See your doctor if not better by next week   ED Prescriptions     Medication Sig Dispense Auth. Provider   amoxicillin-clavulanate  (AUGMENTIN) 875-125 MG tablet Take 1 tablet by mouth every 12 (twelve) hours. 14 tablet Maranda Jamee Jacob, MD   fluticasone (FLONASE) 50 MCG/ACT nasal spray Place 2 sprays into both nostrils daily. 16 g Maranda Jamee Jacob, MD      PDMP not reviewed this encounter.   Maranda Jamee Jacob, MD 10/23/23 [phone]

## 2023-11-03 ENCOUNTER — Other Ambulatory Visit: Payer: Self-pay | Admitting: Internal Medicine

## 2023-11-18 NOTE — Progress Notes (Signed)
 Remote ICD transmission.

## 2023-11-18 NOTE — Addendum Note (Signed)
 Addended by: TAWNI DRILLING D on: 11/18/2023 04:45 PM   Modules accepted: Orders

## 2023-11-19 ENCOUNTER — Other Ambulatory Visit: Payer: Self-pay | Admitting: Cardiology

## 2023-11-19 DIAGNOSIS — I2510 Atherosclerotic heart disease of native coronary artery without angina pectoris: Secondary | ICD-10-CM

## 2023-11-19 DIAGNOSIS — E7800 Pure hypercholesterolemia, unspecified: Secondary | ICD-10-CM

## 2023-11-28 ENCOUNTER — Encounter: Payer: Self-pay | Admitting: Genetic Counselor

## 2023-12-09 NOTE — Progress Notes (Signed)
 Patient Care Team: Perri Ronal PARAS, MD as PCP - General (Internal Medicine) Ladona Heinz, MD as PCP - Cardiology (Cardiology) Perri Ronal PARAS, MD (Internal Medicine)  Visit Date: 12/11/23  Subjective:   Chief Complaint  Patient presents with   Anxiety   Back Pain   Patient PI:Rebecca [name],Female DOB:02-Jan-1971,53 y.o. FMW:[id number]   53 y.o.Female presents today for acute visit with Anxiety; Back Pain. Patient has a past medical history of Situational Stress; Anxiety managed with Effexor -XR 75 mg daily and Lorazepam  1 mg twice daily as needed. In-office today, we discuss extensively her increased anxiety/situational stress regarding multiple events she is dealing with currently, though the main stressor right now is her daughter has recently moved into her college dorms in [place] . She additionally mentions that she's also had some chest pain recently. She had a Coronary CTA in June, which did not show any significant blockages. Declines offer for counseling referral  for anxiety and situational stress at this time.  Also suffering from moderate/severe back pain, which she does have an appointment scheduled to see Dr. Colon, Neurosurgeon,  and she says she has not had much relief of her back pain.  She also says that she has been having more vertigo recently, though her BP has been normal; has been managing this with Meclizine .   Cardiac Status/other health issues, daughter moving out-of-state for college, and Disability Past Medical History:  Diagnosis Date   Acute respiratory disease due to COVID-19 virus 12/25/2019   Allergy    constant hives post chemo therapy.  All allergen tests have been neg.   Anxiety    Breast cancer (HCC)    Coronary artery disease    CRTD Medtronic COBALT XT CRT D 04/05/2022 04/05/2022   Diabetes mellitus without complication (HCC)    Diverticulitis    Encounter for assessment of implantable cardioverter-defibrillator (ICD) 04/06/2022   GERD  (gastroesophageal reflux disease)    Heart failure (HCC) 01/02/2022   EF 25 to 30%   Hiatal hernia    Hyperlipidemia    Hypertension    Hypothyroidism    LBBB (left bundle branch block)    Pneumonia   No Known Allergies Immunization History  Administered Date(s) Administered   Influenza, Quadrivalent, Recombinant, Inj, Pf 01/31/2018   Influenza,inj,Quad PF,6+ Mos 02/04/2016, 01/23/2019, 02/11/2020, 02/18/2022   Influenza,inj,quad, With Preservative 02/10/2017   Influenza-Unspecified 02/07/2023   PFIZER(Purple Top)SARS-COV-2 Vaccination 03/13/2020, 04/12/2020   Unspecified SARS-COV-2 Vaccination 03/13/2020, 04/12/2020   Zoster Recombinant(Shingrix) 10/17/2020   Zoster, Unspecified 10/17/2020   Past Surgical History:  Procedure Laterality Date   BIV ICD INSERTION CRT-D N/A 04/05/2022   Procedure: BIV ICD INSERTION CRT-D;  Surgeon: Waddell Danelle ORN, MD;  Location: [facility]VASIVE CV LAB;  Service: Cardiovascular;  Laterality: N/A;   BREAST RECONSTRUCTION WITH PLACEMENT OF TISSUE EXPANDER AND ALLODERM Bilateral 11/18/2017   Procedure: BILATERAL BREAST RECONSTRUCTION WITH PLACEMENT OF TISSUE EXPANDER AND ALLODERM;  Surgeon: Arelia Filippo, MD;  Location: [facility];  Service: Plastics;  Laterality: Bilateral;   CESAREAN SECTION  2007/2010   x2   COLONOSCOPY  2017   COLONOSCOPY WITH PROPOFOL  N/A 03/19/2022   Procedure: COLONOSCOPY WITH PROPOFOL ;  Surgeon: Albertus Heinz HERO, MD;  Location: [facility];  Service: Gastroenterology;  Laterality: N/A;   LAPAROSCOPIC TUBAL LIGATION  06/12/2011   Procedure: LAPAROSCOPIC TUBAL LIGATION;  Surgeon: Rosaline DELENA Luna, MD;  Location: [facility];  Service: Gynecology;  Laterality: N/A;  filshie clip   LIPOSUCTION WITH LIPOFILLING Bilateral 02/24/2018  Procedure: LIPOFILLING FROM ABDOMEN TO BILATERAL CHEST;  Surgeon: Arelia Filippo, MD;  Location: [facility];  Service: Plastics;  Laterality: Bilateral;   LUMBAR LAMINECTOMY/  DECOMPRESSION WITH MET-RX Left 02/08/2022   Procedure: Left Lumbar Five-Sacral One Microdiscectomy with Metrex;  Surgeon: Colon Shove, MD;  Location: [facility];  Service: Neurosurgery;  Laterality: Left;  3C/RM 20   LUMBAR LAMINECTOMY/ DECOMPRESSION WITH MET-RX Left 05/31/2022   Procedure: Left Lumbar Five-Sacral One Microdiscectomy;  Surgeon: Colon Shove, MD;  Location: [facility];  Service: Neurosurgery;  Laterality: Left;  Patient being admitted after having myelogram/ct this morning   MASTECTOMY W/ SENTINEL NODE BIOPSY Bilateral 11/18/2017   Procedure: BILATERAL TOTAL MASTECTOMIES WITH RIGHT SENTINEL LYMPH NODE BIOPSY;  Surgeon: Ebbie Cough, MD;  Location: [facility];  Service: General;  Laterality: Bilateral;   mini tuck  2012   abdominal   PORT-A-CATH REMOVAL Right 01/05/2019   PORTACATH PLACEMENT Right 07/15/2017   Procedure: INSERTION PORT-A-CATH WITH ULTRASOUND;  Surgeon: Ebbie Cough, MD;  Location: [facility];  Service: General;  Laterality: Right;   REMOVAL OF BILATERAL TISSUE EXPANDERS WITH PLACEMENT OF BILATERAL BREAST IMPLANTS Bilateral 02/24/2018   Procedure: REMOVAL OF BILATERAL TISSUE EXPANDERS WITH PLACEMENT OF BILATERAL BREAST IMPLANTS;  Surgeon: Arelia Filippo, MD;  Location: [facility];  Service: Plastics;  Laterality: Bilateral;   right breast cancer     upper-outer quadrant    right ear surg  03/2008   Mohs   TUBAL LIGATION     US  ECHOCARDIOGRAPHY  12-30-2007   EF 55-60%   WISDOM TOOTH EXTRACTION      Family History  Problem Relation Age of Onset   Lymphoma Father    Cancer Father        lymphoma   Diabetes Brother    Diabetes Mother    Liver disease Mother        NASH, d. 109   Asthma Mother    CAD Maternal Grandmother    CAD Maternal Grandfather    Stomach cancer Neg Hx    Colon cancer Neg Hx    Colon polyps Neg Hx    Esophageal cancer Neg Hx    Rectal cancer Neg Hx    Social History   Social History  Narrative   ** Merged History Encounter **       Review of Systems  Constitutional:  Negative for fever and malaise/fatigue.  HENT:  Negative for congestion.   Eyes:  Negative for blurred vision.  Respiratory:  Negative for cough and shortness of breath.   Cardiovascular:  Positive for chest pain. Negative for palpitations and leg swelling.  Gastrointestinal:  Negative for vomiting.  Musculoskeletal:  Positive for back pain.  Skin:  Negative for rash.  Neurological:  Positive for dizziness. Negative for loss of consciousness and headaches.  Psychiatric/Behavioral:  The patient is nervous/anxious.      Objective:  Vitals: body mass index is 27.98 kg/m. Today's Vitals   12/11/23 0929  BP: 120/80  Pulse: 78  SpO2: 97%  Weight: 184 lb (83.5 kg)  Height: 5' 8 (1.727 m)  PainSc: 8   PainLoc: Back   Physical Exam Vitals and nursing note reviewed.  Constitutional:      General: She is not in acute distress.    Appearance: Normal appearance. She is not toxic-appearing.  HENT:     Head: Normocephalic and atraumatic.  Pulmonary:     Effort: Pulmonary effort is normal.  Skin:  General: Skin is warm and dry.  Neurological:     Mental Status: She is alert and oriented to person, place, and time. Mental status is at baseline.  Psychiatric:        Mood and Affect: Mood normal.        Behavior: Behavior normal.        Thought Content: Thought content normal.        Judgment: Judgment normal.     Results:  Studies Obtained And Personally Reviewed By Me:  Cardiac/Coronary CTA 10/26/2023   CLINICAL DATA:  54 yo female with chest pain  FINDINGS: Image quality: Average.   Noise artifact is: Limited.   Coronary Arteries: Right coronary originates from left coronary cusp with course between pulmonary artery and aorta. Right dominance.   Left main: The left main is a large caliber vessel with a normal take off from the left coronary cusp that bifurcates to form a  left anterior descending artery and a left circumflex artery. There is no plaque or stenosis.   Left anterior descending artery: The LAD has mild (25-49) calcified plaque in the proximal vessel and at takeoff of diagonal. The LAD gives off 1 patent diagonal branch.   Left circumflex artery: The LCX is non-dominant and patent with no evidence of plaque or stenosis. The LCX gives off 1 large patent obtuse marginal branch.   Right coronary artery: The RCA is dominant with normal take off from the right coronary cusp. There is no evidence of plaque or stenosis. The RCA terminates as a PDA and right posterolateral branch without evidence of plaque or stenosis.   Right Atrium: Right atrial size is within normal limits.   Right Ventricle: The right ventricular cavity is within normal limits.   Left Atrium: Left atrial size is normal in size with no left atrial appendage filling defect.   Left Ventricle: The ventricular cavity size is within normal limits.   Pulmonary arteries: Mildly dilated (3.1 cm).   Pulmonary veins: Normal pulmonary venous drainage.   Pericardium: Normal thickness without significant effusion or calcium  present.   Cardiac valves: The aortic valve is trileaflet without significant calcification. The mitral valve is normal without significant calcification.   Aorta: Normal caliber without significant disease.   IMPRESSION: 1. Coronary calcium  score of 132. This was 62 percentile for age-, sex, and race-matched controls.   2. Total plaque volume 152 mm3 which is 77 percentile for age- and sex-matched controls (calcified plaque 38 mm3; non-calcified plaque 114 mm3). TPV is (moderate).   3. Right dominance. Right coronary artery originates from left coronary cusp with course between pulmonary artery and aorta.   4. Mild (25-49) calcified plaque in the proximal LAD and at takeoff of diagonal.   5. ICD leads noted.   6. Mildly dilated pulmonary artery  (3.1 cm).  Extra-cardiac findings: OVER-READ INTERPRETATION  CT CHEST COMPARISON:  Chest CT 04/02/2023   FINDINGS: Vascular: No aortic atherosclerosis. The included aorta is normal in caliber. Left-sided pacemaker in place.   Mediastinum/nodes: No adenopathy or mass.  Minimal hiatal hernia.   Lungs: No focal airspace disease. No pulmonary nodule. No pleural fluid. The included airways are patent.   Upper abdomen: No acute or unexpected findings.   Musculoskeletal: There are no acute or suspicious osseous abnormalities. Bilateral breast implants.   IMPRESSION: Minimal hiatal hernia.      12/11/2023    9:33 AM  Depression screen PHQ 2/9  Decreased Interest 1  Down, Depressed, Hopeless 1  PHQ -  2 Score 2  Altered sleeping 1  Tired, decreased energy 1  Change in appetite 0  Feeling bad or failure about yourself  0  Trouble concentrating 0  Moving slowly or fidgety/restless 0  Suicidal thoughts 0  PHQ-9 Score 4  Difficult doing work/chores Not difficult at all      12/11/2023    9:34 AM  GAD 7 : Generalized Anxiety Score  Nervous, Anxious, on Edge 1  Control/stop worrying 0  Worry too much - different things 0  Trouble relaxing 0  Restless 0  Easily annoyed or irritable 0  Afraid - awful might happen 0  Total GAD 7 Score 1  Anxiety Difficulty Not difficult at all   Labs:  CBC w/ Differential Lab Results  Component Value Date   WBC 5.8 03/10/2023   RBC 4.53 03/10/2023   HGB 12.5 03/10/2023   HCT 39.2 03/10/2023   PLT 224 03/10/2023   MCV 86.5 03/10/2023   MCH 27.6 03/10/2023   MCHC 31.9 03/10/2023   RDW 13.3 03/10/2023   MPV 10.8 10/18/2022   LYMPHSABS 1.6 03/10/2023   MONOABS 0.4 03/10/2023   BASOSABS 0.0 03/10/2023    Comprehensive Metabolic Panel Lab Results  Component Value Date   NA 142 10/08/2023   K 5.0 10/08/2023   CL 103 10/08/2023   CO2 21 10/08/2023   GLUCOSE 132 (H) 10/08/2023   BUN 14 10/08/2023   CREATININE 0.75 10/08/2023    CALCIUM  9.5 10/08/2023   PROT 6.4 (L) 03/10/2023   ALBUMIN  4.2 03/10/2023   AST 11 (L) 03/10/2023   ALT 9 03/10/2023   ALKPHOS 55 03/10/2023   BILITOT 0.3 03/10/2023   EGFR 95 10/08/2023   GFRNONAA >60 03/10/2023   Lipid Panel  Lab Results  Component Value Date   CHOL 145 06/13/2023   HDL 74 06/13/2023   LDLCALC 57 06/13/2023   TRIG 63 06/13/2023   A1c Lab Results  Component Value Date   HGBA1C 5.7 (H) 06/13/2023    TSH Lab Results  Component Value Date   TSH 1.43 01/23/2023   Assessment & Plan:   Meds ordered this encounter  Medications   venlafaxine  XR (EFFEXOR  XR) 150 MG 24 hr capsule    Sig: Take 1 capsule (150 mg total) by mouth daily with breakfast.    Dispense:  31 capsule    Refill:  2   LORazepam  (ATIVAN ) 1 MG tablet    Sig: One tab 3 times daily for anxiety    Dispense:  90 tablet    Refill:  1   traMADol  (ULTRAM ) 50 MG tablet    Sig: Take 1 tablet (50 mg total) by mouth every 8 (eight) hours as needed for up to 5 days.    Dispense:  15 tablet    Refill:  0   Anxiety State; Situational Stress: Anxiety  currently managed with Effexor -XR 75 mg daily and Lorazepam  1 mg twice daily as needed. In-office today, we discuss extensively her increased anxiety/situational stress regarding multiple events she is dealing with currently, including her cardiac status/other health issues, daughter moving out-of-state for college, and disability process. We discussed possibly referring her for counseling, which she declines at this time. Increasing Effexor -XR to 150 mg daily and dosage frequency of Lorazepam  1 mg to take 3 times daily as needed for anxiety. Declines counseling at this time.   Chest Pain for which she had a Coronary CTA in June, which did not show any significant blockages.   Reviewed Coronary  CTA, and I  agree with Dr. Redell Rohrer assessment that her chest pain is non-atherosclerotic, likely contributed by her anxiety. Continue cardiac regimen, monitor  blood pressure, and contact me or Cardiologist if she starts having additional symptoms. Otherwise keep scheduled Cardiology follow up appts.  Back Pain: also suffering from moderate/severe back pain, which she does have an appointment in the future  to see Dr. Colon, Neurosurgeon and but  she says she has  not had much relief for her pain.  Sending in Tramadol  50 mg #15 tablets to take as needed. Keep appointment with Dr. Colon, remain on cancellation list.   Vertigo reportedly having more episodes recently, though her BP has been normal; has been managing this with Meclizine .   Likely related  to stress and anxiety. Continue Meclizine  as needed, Continue Effexor -XR and Ativan  as instructed. When the stress gets better, I believe the vertigo will improve. I think counseling would be of benefit but patient declines at this time.  Remote hx of breast cancer      I,Emily Lagle,acting as a scribe for Ronal JINNY Hailstone, MD.,have documented all relevant documentation on the behalf of Ronal JINNY Hailstone, MD,as directed by  Ronal JINNY Hailstone, MD while in the presence of Ronal JINNY Hailstone, MD.  I, Ronal JINNY Hailstone, MD, have reviewed all documentation for this visit. The documentation on 12/11/2023 for the exam, diagnosis, procedures, and orders are all accurate and complete.

## 2023-12-11 ENCOUNTER — Encounter: Payer: Self-pay | Admitting: Internal Medicine

## 2023-12-11 ENCOUNTER — Ambulatory Visit (INDEPENDENT_AMBULATORY_CARE_PROVIDER_SITE_OTHER): Admitting: Internal Medicine

## 2023-12-11 VITALS — BP 120/80 | HR 78 | Ht 68.0 in | Wt 184.0 lb

## 2023-12-11 DIAGNOSIS — I427 Cardiomyopathy due to drug and external agent: Secondary | ICD-10-CM

## 2023-12-11 DIAGNOSIS — Z853 Personal history of malignant neoplasm of breast: Secondary | ICD-10-CM

## 2023-12-11 DIAGNOSIS — F411 Generalized anxiety disorder: Secondary | ICD-10-CM | POA: Diagnosis not present

## 2023-12-11 DIAGNOSIS — R42 Dizziness and giddiness: Secondary | ICD-10-CM | POA: Diagnosis not present

## 2023-12-11 DIAGNOSIS — F439 Reaction to severe stress, unspecified: Secondary | ICD-10-CM

## 2023-12-11 MED ORDER — VENLAFAXINE HCL ER 150 MG PO CP24: 150.0000 mg | ORAL_CAPSULE | Freq: Every day | ORAL | 2 refills | Status: DC

## 2023-12-11 MED ORDER — LORAZEPAM 1 MG PO TABS
ORAL_TABLET | ORAL | 1 refills | Status: DC
Start: 2023-12-11 — End: 2024-02-23

## 2023-12-11 MED ORDER — TRAMADOL HCL 50 MG PO TABS: 50.0000 mg | ORAL_TABLET | Freq: Three times a day (TID) | ORAL | 0 refills | Status: AC | PRN

## 2023-12-11 NOTE — Patient Instructions (Addendum)
 Increase Ativan  to 3 times daily. Have sent in 5 day supply of Tramadol  for back pain. Continue Meclizine  for vertigo. I believe vertigo is exacerbated by stress. Your health maintenace exam is past due. Last done in June 2024. Please schedule health maintenance exam in the near future.

## 2023-12-24 ENCOUNTER — Encounter: Payer: Self-pay | Admitting: Internal Medicine

## 2023-12-25 ENCOUNTER — Telehealth: Payer: Self-pay | Admitting: Internal Medicine

## 2024-01-05 ENCOUNTER — Ambulatory Visit: Payer: 59

## 2024-01-05 DIAGNOSIS — I427 Cardiomyopathy due to drug and external agent: Secondary | ICD-10-CM

## 2024-01-06 ENCOUNTER — Encounter: Payer: Self-pay | Admitting: Cardiology

## 2024-01-06 LAB — CUP PACEART REMOTE DEVICE CHECK
Battery Remaining Longevity: 80 mo
Battery Voltage: 2.98 V
Brady Statistic RV Percent Paced: 99.27 %
Date Time Interrogation Session: 20250907184011
HighPow Impedance: 64 Ohm
Implantable Lead Connection Status: 753985
Implantable Lead Connection Status: 753985
Implantable Lead Connection Status: 753985
Implantable Lead Implant Date: 20231208
Implantable Lead Implant Date: 20231208
Implantable Lead Implant Date: 20231208
Implantable Lead Location: 753858
Implantable Lead Location: 753859
Implantable Lead Location: 753860
Implantable Lead Model: 5076
Implantable Lead Model: 6935
Implantable Pulse Generator Implant Date: 20231208
Lead Channel Impedance Value: 247 Ohm
Lead Channel Impedance Value: 266 Ohm
Lead Channel Impedance Value: 361 Ohm
Lead Channel Impedance Value: 380 Ohm
Lead Channel Impedance Value: 437 Ohm
Lead Channel Impedance Value: 494 Ohm
Lead Channel Pacing Threshold Amplitude: 0.625 V
Lead Channel Pacing Threshold Amplitude: 0.625 V
Lead Channel Pacing Threshold Amplitude: 0.625 V
Lead Channel Pacing Threshold Pulse Width: 0.4 ms
Lead Channel Pacing Threshold Pulse Width: 0.4 ms
Lead Channel Pacing Threshold Pulse Width: 0.4 ms
Lead Channel Sensing Intrinsic Amplitude: 11.1 mV
Lead Channel Sensing Intrinsic Amplitude: 3.3 mV
Lead Channel Setting Pacing Amplitude: 2 V
Lead Channel Setting Pacing Amplitude: 2 V
Lead Channel Setting Pacing Amplitude: 2 V
Lead Channel Setting Pacing Pulse Width: 0.4 ms
Lead Channel Setting Pacing Pulse Width: 0.4 ms
Lead Channel Setting Sensing Sensitivity: 0.3 mV
Zone Setting Status: 755011
Zone Setting Status: 755011

## 2024-01-08 ENCOUNTER — Other Ambulatory Visit: Payer: Self-pay

## 2024-01-08 ENCOUNTER — Ambulatory Visit
Admission: EM | Admit: 2024-01-08 | Discharge: 2024-01-08 | Disposition: A | Discharge status: Home / Self Care | Attending: Family Medicine | Admitting: Family Medicine

## 2024-01-08 DIAGNOSIS — J029 Acute pharyngitis, unspecified: Secondary | ICD-10-CM

## 2024-01-08 MED ORDER — PREDNISONE 20 MG PO TABS
ORAL_TABLET | ORAL | 0 refills | Status: AC
Start: 2024-01-08 — End: ?

## 2024-01-08 MED ORDER — AZITHROMYCIN 250 MG PO TABS: 250.0000 mg | ORAL_TABLET | Freq: Every day | ORAL | 0 refills | Status: DC

## 2024-01-08 NOTE — Discharge Instructions (Addendum)
 Advised patient to take medications as directed with food to completion.  Advised patient to take prednisone  with Zithromax  daily for the next 5 days.  Encouraged to increase daily water intake to 64 ounces per day while taking these medications.  Advised if symptoms worsen and/or unresolved please follow-up with your PCP or here for further evaluation.

## 2024-01-08 NOTE — ED Triage Notes (Signed)
 Has c/o sore throat since last night. Woke up this morning and could barely swallow. Left ear also hurting. Son seen recently for same, was given steroid and is already better. No fever. No otc meds.

## 2024-01-08 NOTE — ED Provider Notes (Signed)
 [facility]    CSN: [id number] Arrival date & time: 01/08/24  0801      History   Chief Complaint Chief Complaint  Patient presents with   Sore Throat    HPI Rebecca [name] is a 53 y.o. female.   HPI pleasant 53 year old female presents with sore throat since last night.  Patient reports that her son was seen for similar symptoms and given steroids and he feels much better.  PMH significant for breast cancer, CAD, and chronic systolic heart failure.  Past Medical History:  Diagnosis Date   Acute respiratory disease due to COVID-19 virus 12/25/2019   Allergy    constant hives post chemo therapy.  All allergen tests have been neg.   Anxiety    Breast cancer (HCC)    Coronary artery disease    CRTD Medtronic COBALT XT CRT D 04/05/2022 04/05/2022   Diabetes mellitus without complication (HCC)    Diverticulitis    Encounter for assessment of implantable cardioverter-defibrillator (ICD) 04/06/2022   GERD (gastroesophageal reflux disease)    Heart failure (HCC) 01/02/2022   EF 25 to 30%   Hiatal hernia    Hyperlipidemia    Hypertension    Hypothyroidism    LBBB (left bundle branch block)    Pneumonia     Patient Active Problem List   Diagnosis Date Noted   Situational stress 06/09/2023   Herniated nucleus pulposus, L5-S1 05/31/2022   Encounter for assessment of implantable cardioverter-defibrillator (ICD) 04/06/2022   ICD (implantable cardioverter-defibrillator) in place 04/05/2022   Chronic systolic heart failure (HCC) 04/02/2022   Hypertension 04/02/2022   Abnormal CT scan, colon 03/19/2022   Herniated nucleus pulposus, lumbar 02/08/2022   Type 2 diabetes mellitus with obesity (HCC) 12/25/2019   Abdominal pain, epigastric 10/01/2019   Breast cancer, right (HCC) 11/18/2017   Genetic testing 08/19/2017   Port-A-Cath in place 08/08/2017   Malignant neoplasm of upper-outer quadrant of right breast in female, estrogen receptor negative (HCC)  07/14/2017   Dependent edema 10/23/2015   Episodic paroxysmal anxiety disorder 10/23/2015   Attention deficit disorder 10/23/2015   Low TSH level 10/23/2015   Obesity 10/23/2015   History of abdominoplasty 10/23/2015   Chest pain 09/09/2011    Past Surgical History:  Procedure Laterality Date   BIV ICD INSERTION CRT-D N/A 04/05/2022   Procedure: BIV ICD INSERTION CRT-D;  Surgeon: Waddell Danelle ORN, MD;  Location: [facility];  Service: Cardiovascular;  Laterality: N/A;   BREAST RECONSTRUCTION WITH PLACEMENT OF TISSUE EXPANDER AND ALLODERM Bilateral 11/18/2017   Procedure: BILATERAL BREAST RECONSTRUCTION WITH PLACEMENT OF TISSUE EXPANDER AND ALLODERM;  Surgeon: Arelia Filippo, MD;  Location: [facility];  Service: Plastics;  Laterality: Bilateral;   CESAREAN SECTION  2007/2010   x2   COLONOSCOPY  2017   COLONOSCOPY WITH PROPOFOL  N/A 03/19/2022   Procedure: COLONOSCOPY WITH PROPOFOL ;  Surgeon: Albertus Gordy HERO, MD;  Location: [facility];  Service: Gastroenterology;  Laterality: N/A;   LAPAROSCOPIC TUBAL LIGATION  06/12/2011   Procedure: LAPAROSCOPIC TUBAL LIGATION;  Surgeon: Rosaline DELENA Luna, MD;  Location: [facility];  Service: Gynecology;  Laterality: N/A;  filshie clip   LIPOSUCTION WITH LIPOFILLING Bilateral 02/24/2018   Procedure: LIPOFILLING FROM ABDOMEN TO BILATERAL CHEST;  Surgeon: Arelia Filippo, MD;  Location: [facility];  Service: Plastics;  Laterality: Bilateral;   LUMBAR LAMINECTOMY/ DECOMPRESSION WITH MET-RX Left 02/08/2022   Procedure: Left Lumbar Five-Sacral One Microdiscectomy with Metrex;  Surgeon: Colon Shove, MD;  Location: [facility];  Service: Neurosurgery;  Laterality: Left;  3C/RM 20   LUMBAR LAMINECTOMY/ DECOMPRESSION WITH MET-RX Left 05/31/2022   Procedure: Left Lumbar Five-Sacral One Microdiscectomy;  Surgeon: Colon Shove, MD;  Location: [facility];  Service: Neurosurgery;  Laterality: Left;  Patient being admitted after having  myelogram/ct this morning   MASTECTOMY W/ SENTINEL NODE BIOPSY Bilateral 11/18/2017   Procedure: BILATERAL TOTAL MASTECTOMIES WITH RIGHT SENTINEL LYMPH NODE BIOPSY;  Surgeon: Ebbie Cough, MD;  Location: [facility];  Service: General;  Laterality: Bilateral;   mini tuck  2012   abdominal   PORT-A-CATH REMOVAL Right 01/05/2019   PORTACATH PLACEMENT Right 07/15/2017   Procedure: INSERTION PORT-A-CATH WITH ULTRASOUND;  Surgeon: Ebbie Cough, MD;  Location: [facility];  Service: General;  Laterality: Right;   REMOVAL OF BILATERAL TISSUE EXPANDERS WITH PLACEMENT OF BILATERAL BREAST IMPLANTS Bilateral 02/24/2018   Procedure: REMOVAL OF BILATERAL TISSUE EXPANDERS WITH PLACEMENT OF BILATERAL BREAST IMPLANTS;  Surgeon: Arelia Filippo, MD;  Location: [facility];  Service: Plastics;  Laterality: Bilateral;   right breast cancer     upper-outer quadrant    right ear surg  03/2008   Mohs   TUBAL LIGATION     US  ECHOCARDIOGRAPHY  12-30-2007   EF 55-60%   WISDOM TOOTH EXTRACTION      OB History   No obstetric history on file.      Home Medications    Prior to Admission medications   Medication Sig Start Date End Date Taking? Authorizing Provider  azithromycin  (ZITHROMAX ) 250 MG tablet Take 1 tablet (250 mg total) by mouth daily. Take first 2 tablets together, then 1 every day until finished. 01/08/24  Yes Teddy Sharper, FNP  predniSONE  (DELTASONE ) 20 MG tablet Take 3 tabs PO daily x 5 days. 01/08/24  Yes Teddy Sharper, FNP  aspirin  (ASPIRIN  CHILDRENS) 81 MG chewable tablet Chew 1 tablet (81 mg total) by mouth daily. 06/02/23   Ladona Heinz, MD  Aspirin -Caffeine (BAYER BACK & BODY) 500-32.5 MG TABS Take 2 tablets by mouth daily as needed (Pain).    [provider]  EPINEPHrine  0.3 mg/0.3 mL IJ SOAJ injection Use as directed for life threatening allergic reactions only Patient taking differently: Inject 0.3 mg into the muscle as needed  for anaphylaxis. Use as directed for life threatening allergic reactions only 07/26/19   Kozlow, Camellia PARAS, MD  esomeprazole  (NEXIUM ) 40 MG capsule TAKE 1 CAPSULE (40 MG TOTAL) BY MOUTH DAILY AT 12 NOON. 07/15/23   Gudena, Vinay, MD  ezetimibe  (ZETIA ) 10 MG tablet Take 1 tablet (10 mg total) by mouth daily after supper. 11/19/23 02/17/24  Ladona Heinz, MD  fluticasone  (FLONASE ) 50 MCG/ACT nasal spray Place 2 sprays into both nostrils daily. 10/23/23   Maranda Jamee Jacob, MD  furosemide  (LASIX ) 40 MG tablet TAKE 1 TABLET BY MOUTH EVERY DAY AS NEEDED FOR FLUID *SHORTNESS OF BREATH* 05/08/22   Ladona Heinz, MD  ibuprofen (MOTRIN IB) 200 MG tablet Take 400 mg by mouth every 6 (six) hours as needed for mild pain or moderate pain.    [provider]  levothyroxine  (SYNTHROID ) 125 MCG tablet Take 1 tablet (125 mcg total) by mouth daily. 11/05/23   Perri Ronal PARAS, MD  LORazepam  (ATIVAN ) 1 MG tablet One tab 3 times daily for anxiety 12/11/23   Perri Ronal PARAS, MD  meloxicam (MOBIC) 15 MG tablet Take 15 mg by mouth daily. 02/06/22   [provider]  metoprolol  succinate (TOPROL -XL) 25  MG 24 hr tablet Take 1 tablet (25 mg total) by mouth daily. Take with or immediately following a meal. 07/17/23   Ladona Heinz, MD  metoprolol  tartrate (LOPRESSOR ) 100 MG tablet Take one tablet two hours prior to cardiac CTA 09/30/23   Ladona Heinz, MD  montelukast  (SINGULAIR ) 10 MG tablet TAKE ONE TABLET BY MOUTH DAILY 07/08/23   Perri Ronal PARAS, MD  rosuvastatin  (CRESTOR ) 20 MG tablet Take 1 tablet (20 mg total) by mouth daily after supper. 11/19/23 02/17/24  Ladona Heinz, MD  sacubitril -valsartan  (ENTRESTO ) 24-26 MG Take 1 tablet by mouth 2 (two) times daily. 03/03/23   Ganji, Jay, MD  testosterone cypionate (DEPO-TESTOSTERONE) 200 MG/ML injection Inject 200 mg into the muscle every 3 (three) months. 02/04/22   [provider]  tirzepatide  (MOUNJARO ) 15 MG/0.5ML Pen Inject 15 mg into the skin once a week. 07/31/23   Ladona Heinz, MD   triamcinolone cream (KENALOG) 0.1 % Apply 1 Application topically 2 (two) times daily.    [provider]  VAGIFEM 10 MCG TABS vaginal tablet Place 1 tablet vaginally 2 (two) times a week. 02/06/22   [provider]  venlafaxine  XR (EFFEXOR  XR) 150 MG 24 hr capsule Take 1 capsule (150 mg total) by mouth daily with breakfast. 12/11/23   Baxley, Ronal PARAS, MD    Family History Family History  Problem Relation Age of Onset   Lymphoma Father    Cancer Father        lymphoma   Diabetes Brother    Diabetes Mother    Liver disease Mother        NASH, d. 67   Asthma Mother    CAD Maternal Grandmother    CAD Maternal Grandfather    Stomach cancer Neg Hx    Colon cancer Neg Hx    Colon polyps Neg Hx    Esophageal cancer Neg Hx    Rectal cancer Neg Hx     Social History Social History   Tobacco Use   Smoking status: Former    Current packs/day: 0.00    Average packs/day: 0.3 packs/day for 6.0 years (1.5 ttl pk-yrs)    Types: Cigarettes    Start date: 03/30/1999    Quit date: 03/29/2005    Years since quitting: 18.7   Smokeless tobacco: Never  Vaping Use   Vaping status: Never Used  Substance Use Topics   Alcohol use: Yes    Comment: socially   Drug use: No     Allergies   Patient has no known allergies.   Review of Systems Review of Systems  HENT:  Positive for ear pain and sore throat.   All other systems reviewed and are negative.    Physical Exam Triage Vital Signs ED Triage Vitals  Encounter Vitals Group     BP 01/08/24 0813 97/68     Girls Systolic BP Percentile --      Girls Diastolic BP Percentile --      Boys Systolic BP Percentile --      Boys Diastolic BP Percentile --      Pulse Rate 01/08/24 0813 80     Resp 01/08/24 0813 16     Temp 01/08/24 0813 98.1 F (36.7 C)     Temp src --      SpO2 01/08/24 0813 96 %     Weight 01/08/24 0819 183 lb (83 kg)     Height --      Head Circumference --  Peak Flow --      Pain Score  01/08/24 0815 7     Pain Loc --      Pain Education --      Exclude from Growth Chart --    No data found.  Updated Vital Signs BP 97/68   Pulse 80   Temp 98.1 F (36.7 C)   Resp 16   Wt 183 lb (83 kg)   LMP  (LMP Unknown)   SpO2 96%   BMI 27.83 kg/m      Physical Exam Vitals and nursing note reviewed.  Constitutional:      Appearance: Normal appearance. She is well-developed and normal weight. She is ill-appearing.  HENT:     Head: Normocephalic and atraumatic.     Right Ear: Tympanic membrane, ear canal and external ear normal.     Left Ear: Tympanic membrane, ear canal and external ear normal.     Mouth/Throat:     Mouth: Mucous membranes are moist.     Pharynx: Uvula midline. Pharyngeal swelling, posterior oropharyngeal erythema and uvula swelling present.     Tonsils: 0 on the right. 0 on the left.  Eyes:     Conjunctiva/sclera: Conjunctivae normal.     Pupils: Pupils are equal, round, and reactive to light.  Cardiovascular:     Rate and Rhythm: Normal rate and regular rhythm.     Pulses: Normal pulses.     Heart sounds: Normal heart sounds. No murmur heard.    Comments: paced Pulmonary:     Effort: Pulmonary effort is normal.     Breath sounds: Normal breath sounds. No wheezing, rhonchi or rales.  Musculoskeletal:        General: Normal range of motion.  Skin:    General: Skin is warm and dry.  Neurological:     General: No focal deficit present.     Mental Status: She is alert and oriented to person, place, and time.  Psychiatric:        Mood and Affect: Mood normal.        Behavior: Behavior normal.      UC Treatments / Results  Labs (all labs ordered are listed, but only abnormal results are displayed) Labs Reviewed - No data to display  EKG   Radiology No results found.  Procedures Procedures (including critical care time)  Medications Ordered in UC Medications - No data to display  Initial Impression / Assessment and Plan / UC  Course  I have reviewed the triage vital signs and the nursing notes.  Pertinent labs & imaging results that were available during my care of the patient were reviewed by me and considered in my medical decision making (see chart for details).     MDM: 1.  Pharyngitis, unspecified etiology-Rx'd Zithromax  (500 mg day 1, then 250 mg day 2-5); 2.  Sore throat-Rx'd prednisone  20 mg tablet: Take 3 tablets p.o. daily x 5 days. Advised patient to take medications as directed with food to completion.  Advised patient to take prednisone  with Zithromax  daily for the next 5 days.  Encouraged to increase daily water intake to 64 ounces per day while taking these medications.  Advised if symptoms worsen and/or unresolved please follow-up with your PCP or here for further evaluation.  Patient discharged home, hemodynamically stable. Final Clinical Impressions(s) / UC Diagnoses   Final diagnoses:  Pharyngitis, unspecified etiology  Sore throat     Discharge Instructions      Advised patient to take  medications as directed with food to completion.  Advised patient to take prednisone  with Zithromax  daily for the next 5 days.  Encouraged to increase daily water intake to 64 ounces per day while taking these medications.  Advised if symptoms worsen and/or unresolved please follow-up with your PCP or here for further evaluation.     ED Prescriptions     Medication Sig Dispense Auth. Provider   azithromycin  (ZITHROMAX ) 250 MG tablet Take 1 tablet (250 mg total) by mouth daily. Take first 2 tablets together, then 1 every day until finished. 6 tablet Clinton Wahlberg, FNP   predniSONE  (DELTASONE ) 20 MG tablet Take 3 tabs PO daily x 5 days. 15 tablet Cheria Sadiq, FNP      PDMP not reviewed this encounter.   Teddy Sharper, FNP 01/08/24 [phone]

## 2024-01-09 ENCOUNTER — Other Ambulatory Visit (HOSPITAL_COMMUNITY): Payer: Self-pay | Admitting: Neurological Surgery

## 2024-01-09 ENCOUNTER — Other Ambulatory Visit: Payer: Self-pay | Admitting: Neurological Surgery

## 2024-01-09 ENCOUNTER — Encounter: Payer: Self-pay | Admitting: Internal Medicine

## 2024-01-12 ENCOUNTER — Other Ambulatory Visit: Payer: Self-pay | Admitting: Neurological Surgery

## 2024-01-12 ENCOUNTER — Other Ambulatory Visit (HOSPITAL_COMMUNITY): Payer: Self-pay | Admitting: Neurological Surgery

## 2024-01-12 DIAGNOSIS — M4316 Spondylolisthesis, lumbar region: Secondary | ICD-10-CM

## 2024-01-13 ENCOUNTER — Ambulatory Visit: Payer: Self-pay | Admitting: Internal Medicine

## 2024-01-14 ENCOUNTER — Ambulatory Visit (HOSPITAL_COMMUNITY)

## 2024-01-14 ENCOUNTER — Other Ambulatory Visit (HOSPITAL_COMMUNITY)

## 2024-01-14 ENCOUNTER — Encounter: Payer: Self-pay | Admitting: Internal Medicine

## 2024-01-15 ENCOUNTER — Other Ambulatory Visit: Payer: Self-pay

## 2024-01-15 DIAGNOSIS — M5126 Other intervertebral disc displacement, lumbar region: Secondary | ICD-10-CM

## 2024-01-15 DIAGNOSIS — M5416 Radiculopathy, lumbar region: Secondary | ICD-10-CM

## 2024-01-15 DIAGNOSIS — M4316 Spondylolisthesis, lumbar region: Secondary | ICD-10-CM

## 2024-01-15 NOTE — Progress Notes (Signed)
Remote ICD Transmission.

## 2024-01-21 ENCOUNTER — Ambulatory Visit (HOSPITAL_COMMUNITY)
Admission: RE | Admit: 2024-01-21 | Discharge: 2024-01-21 | Disposition: A | Discharge status: Home / Self Care | Source: Ambulatory Visit | Attending: Neurological Surgery | Admitting: Neurological Surgery

## 2024-01-21 ENCOUNTER — Other Ambulatory Visit: Payer: Self-pay

## 2024-01-21 DIAGNOSIS — M4316 Spondylolisthesis, lumbar region: Secondary | ICD-10-CM | POA: Diagnosis present

## 2024-01-21 LAB — GLUCOSE, CAPILLARY: Glucose-Capillary: 88 mg/dL (ref 70–99)

## 2024-01-21 MED ORDER — HYDROCODONE-ACETAMINOPHEN 5-325 MG PO TABS: 1.0000 | ORAL_TABLET | ORAL | Status: DC | PRN

## 2024-01-21 MED ORDER — LIDOCAINE HCL (PF) 1 % IJ SOLN
5.0000 mL | Freq: Once | INTRAMUSCULAR | Status: AC
  Administered 2024-01-21: 5 mL via INTRADERMAL

## 2024-01-21 MED ORDER — ONDANSETRON HCL 4 MG/2ML IJ SOLN
4.0000 mg | Freq: Four times a day (QID) | INTRAMUSCULAR | Status: DC | PRN
Start: 2024-01-21 — End: 2024-01-21

## 2024-01-21 MED ORDER — ONDANSETRON HCL 4 MG/2ML IJ SOLN: 4.0000 mg | Freq: Four times a day (QID) | INTRAMUSCULAR | Status: DC | PRN

## 2024-01-21 MED ORDER — IOHEXOL 180 MG/ML  SOLN
20.0000 mL | Freq: Once | INTRAMUSCULAR | Status: AC | PRN
Start: 2024-01-21 — End: 2024-01-21
  Administered 2024-01-21: 20 mL via INTRATHECAL

## 2024-01-21 MED ORDER — DIAZEPAM 5 MG PO TABS
10.0000 mg | ORAL_TABLET | Freq: Once | ORAL | Status: AC
  Administered 2024-01-21: 10 mg via ORAL
  Filled 2024-01-21: qty 2

## 2024-01-21 NOTE — Progress Notes (Signed)
 Discharge instructions reviewed with patient, at bedside and husband at the car when, he arrived to pick her up. Denies questions or concerns. PT was able to ambulate, and void in the bathroom without difficulty. No s/s of complications at incision. PT was escorted from the unit via wheelchair to personal vehicle

## 2024-01-21 NOTE — Procedures (Signed)
 Patient is a 53 year old individual who has had significant back and lower extremity pain she has had a large extruded fragment of disc at the level of L4-L5 he has had surgery to decompress this area he has recurred a significant pain and now has bilateral lower extremity pain in addition to a centralized back pain that is chronic and unrelenting despite efforts at conservative management.  She cannot tolerate an MRI secondary to an implanted electronic device and a myelogram is now being performed.  Pre op Dx: Lumbar radiculopathy with degenerative disc disease of lumbar spine Post op Dx: Same Procedure: Lumbar myelogram Surgeon: Roselind Klus Puncture level: L3-4 Fluid color: Clear colorless Injection: Iohexol  180, 12 mL Findings: Waist like stenosis at L4-L5 with spondylolisthesis grade 1 at L4-L5.  Other evaluation with CT scanning.

## 2024-01-29 ENCOUNTER — Encounter: Payer: Self-pay | Admitting: Internal Medicine

## 2024-01-30 NOTE — Telephone Encounter (Signed)
Referral was faxed again  

## 2024-02-02 ENCOUNTER — Encounter: Payer: Self-pay | Admitting: Cardiology

## 2024-02-02 DIAGNOSIS — E669 Obesity, unspecified: Secondary | ICD-10-CM

## 2024-02-02 MED ORDER — TIRZEPATIDE 15 MG/0.5ML ~~LOC~~ SOAJ: 15.0000 mg | SUBCUTANEOUS | 6 refills | Status: DC

## 2024-02-11 LAB — LAB REPORT - SCANNED
EGFR: 83
Free T4: 1.76
TSH: 0.19 — AB (ref 0.41–5.90)
TSH: 0.19 — AB (ref 0.41–5.90)

## 2024-02-12 NOTE — Progress Notes (Addendum)
 Patient Care Team: Perri Ronal PARAS, MD as PCP - General (Internal Medicine) Ladona Heinz, MD as PCP - Cardiology (Cardiology) Perri Ronal PARAS, MD (Internal Medicine)  Visit Date: 02/13/24  Subjective:    Patient ID: Rebecca [name] , Female   DOB: 1970/09/18, 53 y.o.    MRN: [id number]   53 y.o. Female presents today for review of recent lab work and for Medical clearance for lumbar spine surgery. She will need Cardiac clearance for surgery from her Cardiologist, Dr. Heinz Schwalbe, in addition to Medical clearance which is being done today.   Has been having increasing low back pain. Difficulty getting out of bed and having chronic unrelenting back pain. She saw Dr. Colon recently for this and films showed spondylolisthesis at L4-L5 increased to over 10 mm. Hx of L5-S1 microdisectomy on left side October 2023.  Surgery to remove large fragment left L5-S1 Feb. 2024. Now Dr. Colon recommends 2 level fusion L4- to sacrum. She is to have a myelogram soon.   There is concern that her implantable defibrillator may have a lead  that is not compatible with MRI so myelogram is being ordered instead.  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  She underwent bilateral mastectomies and reconstructive surgery by Dr. Arelia.  Tumor was ER/PR negative, HER2 positive.   Patient has a past medical history of GE reflux, Hyperlipidemia, Hypothyroidism, Hypertension.  She says that she is exhausted all the time and that she will go to bed at around 8 pm. Sometimes will  nap for 2 hours.    History of Diabetes Mellitus, type II treated with Mounjaro .   Ob 06/13/2023, HgbA1c 5.7%.   History of Hypothyroidism treated with Levothyroxine  125 mg.  On 02/09/2024, her TSH was 0.185.  Social history: She is married.  Daughter is a Printmaker at [employer] .  She has a teenage son who attends [employer] school.   Husband is an Journalist, newspaper and owns and operated his own  Theme park manager company.  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  History of first-degree AV block diagnosed around 2012.  Has a history of Attention Deficit Disorder and has been treated in the past with Adderall.  History of diverticulitis in August 2017.  History of lower extremity edema treated with Maxide.  History of pneumonia in 2016.  Had tubal ligation in 2013.  Had C-sections in 2007 and 2010.  Wisdom teeth extraction in the remote past.  Right ear surgery in 2009.  Abdominoplasty in 2012.  History of hyperplastic polyp removed at [facility] during colonoscopy in 2017.  History of diverticulitis.  History of frequent PVCs.  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  History of type 2 Diabetes mellitus.  In 2023, hemoglobin A1c was 6.7% but improved in February of this year  to 5.7%.  In September 2021 she was hospitalized at [facility] with severe COVID-19 infection.  She was discharged home on home oxygen at 6 L/min and was able to wean herself off of home oxygen.  During her  hospitalization, she was treated with high-dose steroids, remdesivir  for 5 days and baricitinib  2 mg once.  History of Radioactive iodine  treatment for hyperthyroidism by Dr. Tommas, Endocrinologist  and now is treated for  hypothyroidism with thyroid  replacement medication.  History of vitamin D  deficiency.  History of anxiety ,depression and situational stress.  History of iron deficiency.  History of smoking 1/4 pack/day but quit over 14 years ago.  Social alcohol consumption.      .    Vaccine counseling: received Influenza vaccine today.   Past Medical History:  Diagnosis Date   Acute respiratory disease due to COVID-19 virus 12/25/2019   Allergy    constant hives post chemo therapy.  All allergen tests have been neg.   Anxiety    Breast cancer (HCC)    Coronary artery disease    CRTD Medtronic COBALT XT CRT D 04/05/2022 04/05/2022   Diabetes mellitus without complication (HCC)    Diverticulitis    Encounter for assessment of implantable cardioverter-defibrillator (ICD) 04/06/2022   GERD (gastroesophageal reflux disease)    Heart failure (HCC) 01/02/2022   EF 25 to 30%   Hiatal hernia    Hyperlipidemia    Hypertension    Hypothyroidism    LBBB (left bundle branch block)    Pneumonia          Review of Systems  All other systems reviewed and are negative.       Objective:   Vitals: BP 110/80   Temp 98.4 F (36.9 C)   Ht 5' 8 (1.727 m)   Wt 185 lb (83.9 kg)   LMP  (LMP Unknown)   BMI 28.13 kg/m    Physical Exam Vitals and nursing note reviewed.     Skin: Warm and dry.  No cervical adenopathy.  No thyromegaly.  TMs and pharynx are clear.  Neck supple.  Chest clear.  Cardiac exam: Regular rate and rhythm without ectopy.  No lower extremity pitting edema.  Neurological exam is intact without gross focal deficits.  She is alert and oriented.    Results:    Labs:       Component Value Date/Time   NA 142 10/08/2023 0924   K 5.0 10/08/2023 0924   CL 103 10/08/2023 0924   CO2 21 10/08/2023 0924   GLUCOSE 132 (H) 10/08/2023 0924   GLUCOSE 91 03/10/2023 1505   BUN 14 10/08/2023 0924   CREATININE 0.75 10/08/2023 0924   CREATININE 0.92 03/10/2023 1505   CREATININE 0.91 10/18/2022 1118   CALCIUM  9.5 10/08/2023 0924   PROT 6.4 (L) 03/10/2023 1505   PROT 6.4 05/11/2020 0816   ALBUMIN  4.2 03/10/2023 1505   ALBUMIN  4.3 05/11/2020 0816   AST 11 (L) 03/10/2023 1505   ALT 9 03/10/2023 1505   ALKPHOS 55 03/10/2023 1505   BILITOT 0.3  03/10/2023 1505   GFRNONAA >60 03/10/2023 1505   GFRNONAA 83 01/12/2020 1130   GFRAA 86 05/11/2020 0816   GFRAA 97 01/12/2020 1130     Lab Results  Component Value Date   WBC 5.8 03/10/2023   HGB 12.5 03/10/2023   HCT 39.2 03/10/2023   MCV 86.5 03/10/2023   PLT 224 03/10/2023    Lab Results  Component Value Date   CHOL 145 06/13/2023   HDL 74 06/13/2023   LDLCALC 57 06/13/2023   TRIG 63 06/13/2023   CHOLHDL 2.0 06/13/2023    Lab Results  Component Value Date   HGBA1C 5.7 (H) 06/13/2023     Lab  Results  Component Value Date   TSH 1.43 01/23/2023        Assessment & Plan:   Orders Placed This Encounter  Procedures   Flu vaccine trivalent PF, 6mos and older(Flulaval,Afluria,Fluarix,Fluzone)   Hemoglobin A1c   Microalbumin / creatinine urine ratio   Ambulatory referral to Cardiology    Referral Priority:   Urgent    Referral Type:   Consultation    Referral Reason:   Specialty Services Required    Number of Visits Requested:   1   Meds ordered this encounter  Medications   levothyroxine  (SYNTHROID ) 112 MCG tablet    Sig: Take 1 tablet (112 mcg total) by mouth daily.    Dispense:  90 tablet    Refill:  3   HYDROcodone -acetaminophen  (NORCO) 10-325 MG tablet    Sig: Take 1 tablet by mouth every 8 (eight) hours as needed for up to 5 days.    Dispense:  15 tablet    Refill:  0   History of Chemotherapy induced Cardiomyopathy followed by Dr. Ladona.  She will see him soon for Cardiology clearance prior to her lumbar surgery by Dr. Colon.  History of Breast Cancer in remission.  Followed by Dr. Odean  Chronic fatigue  Hypothyroidism TSH is stable on current dose of thyroid  replacement medication.  She says that she is exhausted all the time and that she will go to bed at around 8 pm or she lays down for a second she will nap for 2 hours.    Diabetes Mellitus, type II:  treated with Mounjaro .  06/13/2023 HgbA1c 5.7%.  Hypothyroidism: treated with  Levothyroxine  125 mg.  Lab drawn 02/09/2024 shows TSH  is low at 0.185.    Levothyroxine  decreased from 125  mg to 112 mg daily. Levothyroxine  112 prescribed.   TSH recheck  needed in 6 weeks.   Back pain: She had a left L5-S1 microdiscectomy on 05/31/2022. Her last visit with Dr. Colon at [facility] was on 01/07/2024. She received an epidural steroid injection in , anuary which she say alleviated the pain for about a month before the pain returned. Flexion-extension films show that she has 10 mm mobile anterolisthesis at L4-L5. Her back pain does not radiate to her legs. She has a degenerative disc at the L5-S1 level. Dr. Colon recommended that she have a Two level fusion from L4 to the sacrum. She is having back surgery soon.    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 Ladona. Because of her Cardiac Hx and  ICD device it is recommended that she meet with her Cardiologist  to get cardiac clearance before she has  lumbar disc surgery.    Needs appt with  Dr. Ladona at [facility]. She will call him  Vaccine counseling: patient  received Flu vaccine today.  I,Makayla C Reid,acting as a scribe for Ronal JINNY Hailstone, MD.,have documented all relevant documentation on the behalf of Ronal JINNY Hailstone, MD,as directed by  Ronal JINNY Hailstone, MD while in the presence of Ronal JINNY Hailstone, MD.

## 2024-02-13 ENCOUNTER — Encounter: Payer: Self-pay | Admitting: Internal Medicine

## 2024-02-13 ENCOUNTER — Telehealth: Payer: Self-pay

## 2024-02-13 ENCOUNTER — Ambulatory Visit: Admitting: Internal Medicine

## 2024-02-13 VITALS — BP 110/80 | Temp 98.4°F | Ht 68.0 in | Wt 185.0 lb

## 2024-02-13 DIAGNOSIS — Z7985 Long-term (current) use of injectable non-insulin antidiabetic drugs: Secondary | ICD-10-CM | POA: Diagnosis not present

## 2024-02-13 DIAGNOSIS — Z23 Encounter for immunization: Secondary | ICD-10-CM | POA: Diagnosis not present

## 2024-02-13 DIAGNOSIS — E119 Type 2 diabetes mellitus without complications: Secondary | ICD-10-CM

## 2024-02-13 DIAGNOSIS — I428 Other cardiomyopathies: Secondary | ICD-10-CM

## 2024-02-13 MED ORDER — HYDROCODONE-ACETAMINOPHEN 10-325 MG PO TABS: 1.0000 | ORAL_TABLET | Freq: Three times a day (TID) | ORAL | 0 refills | Status: AC | PRN

## 2024-02-13 MED ORDER — LEVOTHYROXINE SODIUM 112 MCG PO TABS: 112.0000 ug | ORAL_TABLET | Freq: Every day | ORAL | 3 refills | Status: AC

## 2024-02-13 NOTE — Telephone Encounter (Signed)
   Pre-operative Risk Assessment    Patient Name: Rebecca [name]  DOB: 11-13-1970 MRN: [id number]   Date of last office visit: 06/02/23 Date of next office visit: Not scheduled   Request for Surgical Clearance    Procedure:  PLIF L4-L5 L5-S1  Date of Surgery:  Clearance TBD                                Surgeon:  Victory Gens, MD Surgeon's Group or Practice Name:  [facility] Phone number:  [phone] Fax number:  [phone]   Type of Clearance Requested:   - Medical  - Pharmacy:  Hold Aspirin  (Not requested on form but patient is taking Aspirin )   Type of Anesthesia:  Not Indicated   Additional requests/questions:    [name]   02/13/2024, 1:42 PM

## 2024-02-13 NOTE — Telephone Encounter (Signed)
 1st attempt to reach pt regarding surgical clearance and the need for an TELE appointment.  Left pt a detailed message to call back and get that scheduled.

## 2024-02-13 NOTE — Patient Instructions (Addendum)
 Levothyroxine  decreased to 112 mcg daily with TSH and follow up here needed in 6 weeks. Flu vaccine given today. Hgb AIC checked today. Norco 10/325 (#15 tabs)prescribed today for back pain.Needs to see Dr. Margaretann for Cardiology clearance for lumbar surgery with Dr. Colon.

## 2024-02-13 NOTE — Telephone Encounter (Signed)
   Name: Rebecca [name] [facility]  DOB: 1970-07-04  MRN: [id number]  Primary Cardiologist: Gordy Bergamo, MD   Preoperative team, please contact this patient and set up a phone call appointment for further preoperative risk assessment. Please obtain consent and complete medication review. Thank you for your help.  I confirm that guidance regarding antiplatelet and oral anticoagulation therapy has been completed and, if necessary, noted below.  Per office protocol, if patient is without any new symptoms or concerns at the time of their virtual visit, she may hold ASA for 7 days prior to procedure. Please resume ASA as soon as possible postprocedure, at the discretion of the surgeon.    I also confirmed the patient resides in the state of [place] . As per [facility] Medical Board telemedicine laws, the patient must reside in the state in which the provider is licensed.   Lamarr Satterfield, NP 02/13/2024, 1:59 PM [facility]

## 2024-02-14 LAB — HEMOGLOBIN A1C
Hgb A1c MFr Bld: 5.7 % — ABNORMAL HIGH (ref <5.7)
Mean Plasma Glucose: 117 mg/dL
eAG (mmol/L): 6.5 mmol/L

## 2024-02-14 LAB — MICROALBUMIN / CREATININE URINE RATIO
Creatinine, Urine: 189 mg/dL (ref 20–275)
Microalb Creat Ratio: 3 mg/g{creat} (ref <30)
Microalb, Ur: 0.5 mg/dL

## 2024-02-15 ENCOUNTER — Ambulatory Visit: Payer: Self-pay | Admitting: Internal Medicine

## 2024-02-16 ENCOUNTER — Telehealth: Payer: Self-pay

## 2024-02-16 NOTE — Telephone Encounter (Signed)
 Pt. Scheduled for pre-op clearance on 02/20/24 with Damien Braver, NP.     Patient Consent for Virtual Visit        Rebecca [name] has provided verbal consent on 02/16/2024 for a virtual visit (video or telephone).   CONSENT FOR VIRTUAL VISIT FOR:  Rebecca [name]  By participating in this virtual visit I agree to the following:  I hereby voluntarily request, consent and authorize [facility] and its employed or contracted physicians, physician assistants, nurse practitioners or other licensed health care professionals (the Practitioner), to provide me with telemedicine health care services (the "Services) as deemed necessary by the treating Practitioner. I acknowledge and consent to receive the Services by the Practitioner via telemedicine. I understand that the telemedicine visit will involve communicating with the Practitioner through live audiovisual communication technology and the disclosure of certain medical information by electronic transmission. I acknowledge that I have been given the opportunity to request an in-person assessment or other available alternative prior to the telemedicine visit and am voluntarily participating in the telemedicine visit.  I understand that I have the right to withhold or withdraw my consent to the use of telemedicine in the course of my care at any time, without affecting my right to future care or treatment, and that the Practitioner or I may terminate the telemedicine visit at any time. I understand that I have the right to inspect all information obtained and/or recorded in the course of the telemedicine visit and may receive copies of available information for a reasonable fee.  I understand that some of the potential risks of receiving the Services via telemedicine include:  Delay or interruption in medical evaluation due to technological equipment failure or disruption; Information transmitted may not be sufficient (e.g. poor  resolution of images) to allow for appropriate medical decision making by the Practitioner; and/or  In rare instances, security protocols could fail, causing a breach of personal health information.  Furthermore, I acknowledge that it is my responsibility to provide information about my medical history, conditions and care that is complete and accurate to the best of my ability. I acknowledge that Practitioner's advice, recommendations, and/or decision may be based on factors not within their control, such as incomplete or inaccurate data provided by me or distortions of diagnostic images or specimens that may result from electronic transmissions. I understand that the practice of medicine is not an exact science and that Practitioner makes no warranties or guarantees regarding treatment outcomes. I acknowledge that a copy of this consent can be made available to me via my patient portal [facility] MyChart), or I can request a printed copy by calling the office of [facility].    I understand that my insurance will be billed for this visit.   I have read or had this consent read to me. I understand the contents of this consent, which adequately explains the benefits and risks of the Services being provided via telemedicine.  I have been provided ample opportunity to ask questions regarding this consent and the Services and have had my questions answered to my satisfaction. I give my informed consent for the services to be provided through the use of telemedicine in my medical care

## 2024-02-18 LAB — HM PAP SMEAR
HM Pap smear: NEGATIVE
HPV, high-risk: NEGATIVE

## 2024-02-19 ENCOUNTER — Encounter: Payer: Self-pay | Admitting: Cardiology

## 2024-02-20 ENCOUNTER — Encounter: Payer: Self-pay | Admitting: Internal Medicine

## 2024-02-20 ENCOUNTER — Ambulatory Visit: Attending: Cardiovascular Disease | Admitting: Nurse Practitioner

## 2024-02-20 DIAGNOSIS — Z01810 Encounter for preprocedural cardiovascular examination: Secondary | ICD-10-CM | POA: Diagnosis not present

## 2024-02-20 NOTE — Progress Notes (Signed)
 Virtual Visit via Telephone Note   Because of Rebecca [name] co-morbid illnesses, she is at least at moderate risk for complications without adequate follow up.  This format is felt to be most appropriate for this patient at this time.  Due to technical limitations with video connection Web designer), today's appointment will be conducted as an audio only telehealth visit, and Rebecca [name] verbally agreed to proceed in this manner.   All issues noted in this document were discussed and addressed.  No physical exam could be performed with this format.  Evaluation Performed:  Preoperative cardiovascular risk assessment _____________   Date:  02/20/2024   Patient ID:  Rebecca [name], DOB Oct 11, 1970, MRN [id number] Patient Location:  Home Provider location:   Office  Primary Care Provider:  Perri Ronal PARAS, MD Primary Cardiologist:  Gordy Bergamo, MD  Chief Complaint / Patient Profile   53 y.o. y/o female with a h/o nonobstructive CAD, LBBB, hyperthyroidism S/P RAI therapy in March 2018, depression, DM, estrogen negative right breast cancer S/P Bilateral mastectomy and adjuvant chemotherapy(Adriamycin  and Cytoxan  x4), with severe LV systolic dysfunction s/p BV ICD implantation with Medtronic CRT-D, who is pending PLIF L4-L5 L5-S1 with Dr. Victory Gens of [facility] and presents today for telephonic preoperative cardiovascular risk assessment.  History of Present Illness    Rebecca [name] is a 53 y.o. female who presents via audio/video conferencing for a telehealth visit today.  Pt was last seen in cardiology clinic on 06/02/2023 by Dr. Bergamo.  At that time Rebecca [name] was doing well.  The patient is now pending procedure as outlined above. Since her last visit, she has been stable from a cardiac standpoint.  She notes intermittent chest discomfort, unchanged from prior visits.  Coronary CT angiogram in 09/2023 showed nonobstructive CAD.  She  denies palpitations, dyspnea, pnd, orthopnea, n, v, dizziness, syncope, edema, weight gain, or early satiety. All other systems reviewed and are otherwise negative except as noted above.   Past Medical History    Past Medical History:  Diagnosis Date   Acute respiratory disease due to COVID-19 virus 12/25/2019   Allergy    constant hives post chemo therapy.  All allergen tests have been neg.   Anxiety    Breast cancer (HCC)    Coronary artery disease    CRTD Medtronic [place] XT CRT D 04/05/2022 04/05/2022   Diabetes mellitus without complication (HCC)    Diverticulitis    Encounter for assessment of implantable cardioverter-defibrillator (ICD) 04/06/2022   GERD (gastroesophageal reflux disease)    Heart failure (HCC) 01/02/2022   EF 25 to 30%   Hiatal hernia    Hyperlipidemia    Hypertension    Hypothyroidism    LBBB (left bundle branch block)    Pneumonia    Past Surgical History:  Procedure Laterality Date   BIV ICD INSERTION CRT-D N/A 04/05/2022   Procedure: BIV ICD INSERTION CRT-D;  Surgeon: Waddell Danelle ORN, MD;  Location: [facility];  Service: Cardiovascular;  Laterality: N/A;   BREAST RECONSTRUCTION WITH PLACEMENT OF TISSUE EXPANDER AND ALLODERM Bilateral 11/18/2017   Procedure: BILATERAL BREAST RECONSTRUCTION WITH PLACEMENT OF TISSUE EXPANDER AND ALLODERM;  Surgeon: Arelia Filippo, MD;  Location: [facility];  Service: Plastics;  Laterality: Bilateral;   CESAREAN SECTION  2007/2010   x2   COLONOSCOPY  2017   COLONOSCOPY WITH PROPOFOL  N/A 03/19/2022   Procedure: COLONOSCOPY WITH PROPOFOL ;  Surgeon: Albertus Gordy HERO, MD;  Location: [facility];  Service: Gastroenterology;  Laterality: N/A;   LAPAROSCOPIC TUBAL LIGATION  06/12/2011   Procedure: LAPAROSCOPIC TUBAL LIGATION;  Surgeon: Rosaline DELENA Luna, MD;  Location: [facility];  Service: Gynecology;  Laterality: N/A;  filshie clip   LIPOSUCTION WITH LIPOFILLING Bilateral 02/24/2018   Procedure: LIPOFILLING  FROM ABDOMEN TO BILATERAL CHEST;  Surgeon: Arelia Filippo, MD;  Location: [facility];  Service: Plastics;  Laterality: Bilateral;   LUMBAR LAMINECTOMY/ DECOMPRESSION WITH MET-RX Left 02/08/2022   Procedure: Left Lumbar Five-Sacral One Microdiscectomy with Metrex;  Surgeon: Colon Shove, MD;  Location: [facility];  Service: Neurosurgery;  Laterality: Left;  3C/RM 20   LUMBAR LAMINECTOMY/ DECOMPRESSION WITH MET-RX Left 05/31/2022   Procedure: Left Lumbar Five-Sacral One Microdiscectomy;  Surgeon: Colon Shove, MD;  Location: [facility];  Service: Neurosurgery;  Laterality: Left;  Patient being admitted after having myelogram/ct this morning   MASTECTOMY W/ SENTINEL NODE BIOPSY Bilateral 11/18/2017   Procedure: BILATERAL TOTAL MASTECTOMIES WITH RIGHT SENTINEL LYMPH NODE BIOPSY;  Surgeon: Ebbie Cough, MD;  Location: [facility];  Service: General;  Laterality: Bilateral;   mini tuck  2012   abdominal   PORT-A-CATH REMOVAL Right 01/05/2019   PORTACATH PLACEMENT Right 07/15/2017   Procedure: INSERTION PORT-A-CATH WITH ULTRASOUND;  Surgeon: Ebbie Cough, MD;  Location: [facility];  Service: General;  Laterality: Right;   REMOVAL OF BILATERAL TISSUE EXPANDERS WITH PLACEMENT OF BILATERAL BREAST IMPLANTS Bilateral 02/24/2018   Procedure: REMOVAL OF BILATERAL TISSUE EXPANDERS WITH PLACEMENT OF BILATERAL BREAST IMPLANTS;  Surgeon: Arelia Filippo, MD;  Location: [facility];  Service: Plastics;  Laterality: Bilateral;   right breast cancer     upper-outer quadrant    right ear surg  03/2008   Mohs   TUBAL LIGATION     US  ECHOCARDIOGRAPHY  12-30-2007   EF 55-60%   WISDOM TOOTH EXTRACTION      Allergies  No Known Allergies  Home Medications    Prior to Admission medications   Medication Sig Start Date End Date Taking? Authorizing Provider  aspirin  (ASPIRIN  CHILDRENS) 81 MG chewable tablet Chew 1 tablet (81 mg total) by mouth daily.  06/02/23   Ladona Heinz, MD  Aspirin -Caffeine (BAYER BACK & BODY) 500-32.5 MG TABS Take 2 tablets by mouth daily as needed (Pain).    [provider]  EPINEPHrine  0.3 mg/0.3 mL IJ SOAJ injection Use as directed for life threatening allergic reactions only Patient taking differently: Inject 0.3 mg into the muscle as needed for anaphylaxis. Use as directed for life threatening allergic reactions only 07/26/19   Kozlow, Camellia PARAS, MD  esomeprazole  (NEXIUM ) 40 MG capsule TAKE 1 CAPSULE (40 MG TOTAL) BY MOUTH DAILY AT 12 NOON. 07/15/23   Gudena, Vinay, MD  ezetimibe  (ZETIA ) 10 MG tablet Take 1 tablet (10 mg total) by mouth daily after supper. 11/19/23 02/17/24  Ladona Heinz, MD  fluticasone  (FLONASE ) 50 MCG/ACT nasal spray Place 2 sprays into both nostrils daily. 10/23/23   Maranda Jamee Jacob, MD  furosemide  (LASIX ) 40 MG tablet TAKE 1 TABLET BY MOUTH EVERY DAY AS NEEDED FOR FLUID *SHORTNESS OF BREATH* 05/08/22   Ladona Heinz, MD  ibuprofen (MOTRIN IB) 200 MG tablet Take 400 mg by mouth every 6 (six) hours as needed for mild pain or moderate pain.    [provider]  levothyroxine  (SYNTHROID ) 112 MCG tablet Take 1 tablet (112 mcg total) by mouth daily. 02/13/24   Perri Ronal PARAS, MD  LORazepam  (ATIVAN ) 1  MG tablet One tab 3 times daily for anxiety 12/11/23   Perri Ronal PARAS, MD  meloxicam (MOBIC) 15 MG tablet Take 15 mg by mouth daily. 02/06/22   [provider]  metoprolol  succinate (TOPROL -XL) 25 MG 24 hr tablet Take 1 tablet (25 mg total) by mouth daily. Take with or immediately following a meal. 07/17/23   Ladona Heinz, MD  metoprolol  tartrate (LOPRESSOR ) 100 MG tablet Take one tablet two hours prior to cardiac CTA 09/30/23   Ladona Heinz, MD  montelukast  (SINGULAIR ) 10 MG tablet TAKE ONE TABLET BY MOUTH DAILY 07/08/23   Perri Ronal PARAS, MD  rosuvastatin  (CRESTOR ) 20 MG tablet Take 1 tablet (20 mg total) by mouth daily after supper. 11/19/23 02/17/24  Ladona Heinz, MD  sacubitril -valsartan  (ENTRESTO )  24-26 MG Take 1 tablet by mouth 2 (two) times daily. 03/03/23   Ganji, Jay, MD  testosterone cypionate (DEPO-TESTOSTERONE) 200 MG/ML injection Inject 200 mg into the muscle every 3 (three) months. 02/04/22   [provider]  tirzepatide  (MOUNJARO ) 15 MG/0.5ML Pen Inject 15 mg into the skin once a week. 02/02/24   Ladona Heinz, MD  triamcinolone cream (KENALOG) 0.1 % Apply 1 Application topically 2 (two) times daily.    [provider]  VAGIFEM 10 MCG TABS vaginal tablet Place 1 tablet vaginally 2 (two) times a week. 02/06/22   [provider]  venlafaxine  XR (EFFEXOR  XR) 150 MG 24 hr capsule Take 1 capsule (150 mg total) by mouth daily with breakfast. 12/11/23   Perri Ronal PARAS, MD    Physical Exam    Vital Signs:  Rebecca [name] does not have vital signs available for review today.  Given telephonic nature of communication, physical exam is limited. AAOx3. NAD. Normal affect.  Speech and respirations are unlabored.  Accessory Clinical Findings    None  Assessment & Plan    1.  Preoperative Cardiovascular Risk Assessment:  According to the Revised Cardiac Risk Index (RCRI), her Perioperative Risk of Major Cardiac Event is (%): 0.4. Her Functional Capacity in METs is: 7.01 according to the Duke Activity Status Index (DASI). Therefore, based on ACC/AHA guidelines, patient would be at acceptable risk for the planned procedure without further cardiovascular testing.   The patient was advised that if she develops new symptoms prior to surgery to contact our office to arrange for a follow-up visit, and she verbalized understanding.  Per office protocol, she may hold Aspirin  for 5-7 days prior to procedure. Please resume Aspirin  as soon as possible postprocedure, at the discretion of the surgeon.   I will send original clearance request to our device team so that they may also send any necessary recommendations to the requesting party.  A copy of this note will be  routed to requesting surgeon.  Time:   Today, I have spent 5 minutes with the patient with telehealth technology discussing medical history, symptoms, and management plan.     Damien JAYSON Braver, NP  02/20/2024, 2:31 PM

## 2024-02-20 NOTE — Telephone Encounter (Signed)
 Spoke w/ patient - she is scheduled for 10/30 with Aniceto, NP for yearly f/u (due 06/2023) + preop clearance.

## 2024-02-20 NOTE — Telephone Encounter (Addendum)
 Notes per preop APP, notes from EP referring to pt needs appt with EP for device clearance.  I will send a message to EP schedulers.     Received: Today Monge, Damien BROCKS, NP  P Cv Div Preop Callback See notes from device RN.  Apparently she needs an appointment with the EP for clearance for her device for surgery.

## 2024-02-23 ENCOUNTER — Other Ambulatory Visit: Payer: Self-pay | Admitting: Internal Medicine

## 2024-02-24 ENCOUNTER — Encounter: Payer: Self-pay | Admitting: *Deleted

## 2024-02-24 NOTE — Progress Notes (Signed)
 [employer] Renewal orders placed via their portal.

## 2024-02-25 NOTE — Progress Notes (Unsigned)
  Electrophysiology Office Note:   Date:  02/26/2024  ID:  Rebecca, [name] 1970/07/07, MRN [id number]  Primary Cardiologist: Gordy Bergamo, MD Primary Heart Failure: None Electrophysiologist: Danelle Birmingham, MD       History of Present Illness:   Rebecca [name] is a 53 y.o. female with h/o nonobstructive CAD, LBBB, hyperthyroidism S/P RAI therapy in 06/2016, depression, DM, estrogen negative right breast cancer s/p Bilateral mastectomy and adjuvant chemotherapy (Adriamycin  and Cytoxan  x4), with severe LV systolic dysfunction s/p BV ICD implantation with Medtronic CRT-D seen today for routine electrophysiology followup.   Pt pending PLIF L4-L5 L5-S1 with Dr. Colon.  She had cardiac risk assessment completed 02/20/24 per Cardiology and was felt to be at acceptable risk to move forward with surgery.    Since last being seen in our clinic the patient reports doing largely well. She is pending an additional back surgery.  She has no acute device related concerns.      She denies chest pain, palpitations, dyspnea, PND, orthopnea, nausea, vomiting, dizziness, syncope, edema, weight gain, or early satiety.   Review of systems complete and found to be negative unless listed in HPI.    EP Information / Studies Reviewed:    EKG is ordered today. Personal review as below.  EKG Interpretation Date/Time:  Thursday February 26 2024 08:22:51 EDT Ventricular Rate:  71 PR Interval:  130 QRS Duration:  118 QT Interval:  412 QTC Calculation: 447 R Axis:   29  Text Interpretation: Atrial-sensed ventricular-paced rhythm Biventricular pacemaker detected Confirmed by Aniceto Jarvis ([id number]) on 02/26/2024 8:28:33 AM   ICD Interrogation-  reviewed in detail today,  See PACEART report.  Device History: Medtronic BiV ICD implanted 04/05/2022 for NICM History of appropriate therapy: No History of AAD therapy: No   Risk Assessment/Calculations:              Physical Exam:   VS:  BP 95/69    Pulse 71   Ht 5' 9 (1.753 m)   Wt 185 lb (83.9 kg)   LMP  (LMP Unknown)   SpO2 99%   BMI 27.32 kg/m    Wt Readings from Last 3 Encounters:  02/26/24 185 lb (83.9 kg)  02/13/24 185 lb (83.9 kg)  01/21/24 183 lb (83 kg)     GEN: Well nourished, well developed in no acute distress NECK: No JVD; No carotid bruits CARDIAC: Regular rate and rhythm, no murmurs, rubs, gallops RESPIRATORY:  Clear to auscultation without rales, wheezing or rhonchi  ABDOMEN: Soft, non-tender, non-distended EXTREMITIES:  No edema; No deformity   ASSESSMENT AND PLAN:    Chronic Systolic Dysfunction s/p Medtronic CRT-D  -euvolemic on exam / by device   -99.3% BiV pacing  -Stable on an appropriate medical regimen -Normal ICD function -See Pace Art report -Threshold trend monitoring turned on  -normal in clinic device check > will forward to the Device Clinic for review / recommendations related to the device for the performing surgeon    Disposition:   Follow up with Dr. Almetta / EP APP  in 12 months > plan to transition to Dr. Almetta, reviewed with patient.    Signed, Jarvis Aniceto, NP-C, AGACNP-BC [facility] - Electrophysiology  02/26/2024, 8:28 AM

## 2024-02-26 ENCOUNTER — Encounter: Payer: Self-pay | Admitting: Pulmonary Disease

## 2024-02-26 ENCOUNTER — Encounter: Payer: Self-pay | Admitting: Internal Medicine

## 2024-02-26 ENCOUNTER — Ambulatory Visit: Attending: Pulmonary Disease | Admitting: Pulmonary Disease

## 2024-02-26 VITALS — BP 95/69 | HR 71 | Ht 69.0 in | Wt 185.0 lb

## 2024-02-26 DIAGNOSIS — I427 Cardiomyopathy due to drug and external agent: Secondary | ICD-10-CM

## 2024-02-26 DIAGNOSIS — Z95810 Presence of automatic (implantable) cardiac defibrillator: Secondary | ICD-10-CM

## 2024-02-26 DIAGNOSIS — I5022 Chronic systolic (congestive) heart failure: Secondary | ICD-10-CM

## 2024-02-26 LAB — CUP PACEART INCLINIC DEVICE CHECK
Date Time Interrogation Session: 20251030102348
Implantable Lead Connection Status: 753985
Implantable Lead Connection Status: 753985
Implantable Lead Connection Status: 753985
Implantable Lead Implant Date: 20231208
Implantable Lead Implant Date: 20231208
Implantable Lead Implant Date: 20231208
Implantable Lead Location: 753858
Implantable Lead Location: 753859
Implantable Lead Location: 753860
Implantable Lead Model: 5076
Implantable Lead Model: 6935
Implantable Pulse Generator Implant Date: 20231208

## 2024-02-26 NOTE — Progress Notes (Signed)
 NOTES HAVE BEEN FAXED TO DR. COLON TODAY

## 2024-02-26 NOTE — Progress Notes (Signed)
 PERIOPERATIVE PRESCRIPTION FOR IMPLANTED CARDIAC DEVICE PROGRAMMING  Patient Information: Name:  Rebecca [name]  DOB:  05/10/1970  MRN:  [id number]   Procedure:  PLIF L4-L5 L5-S1   Date of Surgery:  Clearance TBD                                  Surgeon:  Victory Gens, MD Surgeon's Group or Practice Name:  [facility] Phone number:  [phone] Fax number:  [phone]   Type of Clearance Requested:   - Medical  - Pharmacy:  Hold Aspirin  (Not requested on form but patient is taking Aspirin )   Type of Anesthesia:  Not Indicated Device Information:  Clinic EP Physician:  Danelle Birmingham, MD   Device Type:  Defibrillator Manufacturer and Phone #:  Medtronic: [phone] Pacemaker Dependent?:  No. Date of Last Device Check:  02/26/24 Normal Device Function?:  Yes.    Electrophysiologist's Recommendations:  Have magnet available. Provide continuous ECG monitoring when magnet is used or reprogramming is to be performed.  Procedure will likely interfere with device function.  Device should be programmed:  Tachy therapies disabled  Per Device Clinic Standing Orders, [name], RN  11:18 AM 02/26/2024

## 2024-02-26 NOTE — Patient Instructions (Signed)
 Medication Instructions:  Your physician recommends that you continue on your current medications as directed. Please refer to the Current Medication list given to you today.  *If you need a refill on your cardiac medications before your next appointment, please call your pharmacy*  Lab Work: None ordered If you have labs (blood work) drawn today and your tests are completely normal, you will receive your results only by: MyChart Message (if you have MyChart) OR A paper copy in the mail If you have any lab test that is abnormal or we need to change your treatment, we will call you to review the results.  Follow-Up: At [facility], you and your health needs are our priority.  As part of our continuing mission to provide you with exceptional heart care, our providers are all part of one team.  This team includes your primary Cardiologist (physician) and Advanced Practice Providers or APPs (Physician Assistants and Nurse Practitioners) who all work together to provide you with the care you need, when you need it.  Your next appointment:   1 year(s)  Provider:   You will see one of the following Advanced Practice Providers on your designated Care Team:   Charlies Arthur, [place] Ozell Jodie Passey, PA-C Suzann Riddle, NP Daphne Barrack, NP Artist Pouch, PA-C

## 2024-02-26 NOTE — Progress Notes (Signed)
 Order(s) created erroneously. Erroneous order ID: [id number]  Order canceled by: TORI DELLAR LABOR  Order cancel date/time: 02/26/2024 11:32 AM

## 2024-03-01 NOTE — Progress Notes (Signed)
 Sent note to surgeon as requested.

## 2024-03-02 ENCOUNTER — Other Ambulatory Visit: Payer: Self-pay | Admitting: Neurological Surgery

## 2024-03-09 ENCOUNTER — Inpatient Hospital Stay: Payer: 59 | Attending: Hematology and Oncology | Admitting: Hematology and Oncology

## 2024-03-09 VITALS — BP 109/65 | HR 94 | Temp 98.3°F | Resp 18 | Ht 69.0 in | Wt 187.5 lb

## 2024-03-09 DIAGNOSIS — Z171 Estrogen receptor negative status [ER-]: Secondary | ICD-10-CM | POA: Diagnosis not present

## 2024-03-09 DIAGNOSIS — C50411 Malignant neoplasm of upper-outer quadrant of right female breast: Secondary | ICD-10-CM | POA: Diagnosis not present

## 2024-03-09 DIAGNOSIS — Z9013 Acquired absence of bilateral breasts and nipples: Secondary | ICD-10-CM | POA: Diagnosis not present

## 2024-03-09 DIAGNOSIS — Z853 Personal history of malignant neoplasm of breast: Secondary | ICD-10-CM | POA: Diagnosis present

## 2024-03-09 MED ORDER — MECLIZINE HCL 12.5 MG PO TABS: 12.5000 mg | ORAL_TABLET | Freq: Three times a day (TID) | ORAL | 3 refills | Status: AC | PRN

## 2024-03-09 NOTE — Progress Notes (Signed)
 Patient Care Team: Perri Ronal PARAS, MD as PCP - General (Internal Medicine) Ladona Heinz, MD as PCP - Cardiology (Cardiology) Waddell Danelle ORN, MD as PCP - Electrophysiology (Cardiology) Baxley, Ronal PARAS, MD (Internal Medicine)  DIAGNOSIS:  Encounter Diagnosis  Name Primary?   Malignant neoplasm of upper-outer quadrant of right breast in female, estrogen receptor negative (HCC) Yes    SUMMARY OF ONCOLOGIC HISTORY: Oncology History  Malignant neoplasm of upper-outer quadrant of right breast in female, estrogen receptor negative (HCC)  07/09/2017 Initial Diagnosis   Patient palpated a right breast mass which was evaluated by mammogram and ultrasound and a breast MRI which revealed 4.5 x 4 cm necrotic tumor with suspicious multiple right axillary lymph nodes; biopsy revealed IDC grade 3 ER 0%, PR 0%, HER-2 positive ratio 3.08, Ki-67 70%, T2NX stage IIa/IIb   07/18/2017 - 11/04/2017 Neo-Adjuvant Chemotherapy   TCH Perjeta  x6 cycles followed by Herceptin  Perjeta  maintenance   08/15/2017 Genetic Testing   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  The Multi-Gene Panel offered by [facility] includes sequencing and/or deletion duplication testing of the following 83 genes: ALK, APC, ATM, AXIN2,BAP1,  BARD1, BLM, BMPR1A, BRCA1, BRCA2, BRIP1, CASR, CDC73, CDH1, CDK4, CDKN1B, CDKN1C, CDKN2A (p14ARF), CDKN2A (p16INK4a), CEBPA, CHEK2, CTNNA1, DICER1, DIS3L2, EGFR (c.2369C>T, p.Thr790Met variant only), EPCAM (Deletion/duplication testing only), FH, FLCN, GATA2, GPC3, GREM1 (Promoter region deletion/duplication testing only), HOXB13 (c.251G>A, p.Gly84Glu), HRAS, KIT, MAX, MEN1, MET, MITF (c.952G>A, p.Glu318Lys variant only), MLH1, MSH2, MSH3, MSH6, MUTYH, NBN, NF1, NF2, NTHL1, PALB2, PDGFRA, PHOX2B, PMS2, POLD1, POLE, POT1, PRKAR1A, PTCH1, PTEN, RAD50, RAD51C, RAD51D, RB1, RECQL4, RET, RUNX1, SDHAF2, SDHA (sequence changes only), SDHB, SDHC, SDHD, SMAD4, SMARCA4, SMARCB1, SMARCE1, STK11,  SUFU, TERT, TERT, TMEM127, TP53, TSC1, TSC2, VHL, WRN and WT1.  The report date is August 15, 2017.  UPDATE: MSH6 c.3887A>G (p.Lys1296Arg) VUS has been amended to benign.  The amended report date is 10/13/2023.   11/18/2017 Surgery   Bilateral mastectomies: Right mastectomy: IDC grade 3 3.8 cm margins negative, 0/4 lymph nodes negative, ER 0%, PR 0%, HER-2 negative ratio 1.3, IHC HER-2 negative; Ki-67 70%, RCB class II; left mastectomy: [facility]   12/18/2017 - 01/27/2018 Chemotherapy   Adjuvant chemotherapy with dose dense Adriamycin  and Cytoxan  x4   02/17/2018 -  Chemotherapy   Maintenance Herceptin  and Perjeta    02/24/2018 Surgery   REMOVAL OF BILATERAL TISSUE EXPANDERS WITH PLACEMENT OF BILATERAL BREAST IMPLANTS and LIPOFILLING FROM ABDOMEN TO BILATERAL CHEST by Dr. Arelia      CHIEF COMPLIANT: Surveillance of breast cancer  HISTORY OF PRESENT ILLNESS: History of Present Illness Rebecca [name] is a 53 year old female who presents with anxiety and a rash on her back.  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 During a panic attack on her way to the appointment, she noticed a rash breakout on her back. She applied cream but was unable to reach the affected area fully and plans to take Benadryl  at home. She requested a refill of her meclizine , which she associates with stress.  She is awaiting a disability hearing scheduled for December 30th, which has been a source of stress. Additionally, her daughter has left for college, and her son has started high school, contributing to her anxiety.  She is scheduled for back surgery on December 27th, which will be her third surgery. The procedure will involve a fusion at the L4 and L5 vertebrae. She expresses nervousness about  the upcoming surgery.  She mentions experiencing chest pain in the past, for which she underwent an EKG and an angiogram. The  results showed mild findings, but nothing significant. She also had her device checked recently in preparation for her surgery. This has added to her stress levels.     ALLERGIES:  has no known allergies.  MEDICATIONS:  Current Outpatient Medications  Medication Sig Dispense Refill   EPINEPHrine  0.3 mg/0.3 mL IJ SOAJ injection Use as directed for life threatening allergic reactions only 2 each 3   esomeprazole  (NEXIUM ) 40 MG capsule TAKE 1 CAPSULE (40 MG TOTAL) BY MOUTH DAILY AT 12 NOON. 90 capsule 3   ezetimibe  (ZETIA ) 10 MG tablet Take 1 tablet (10 mg total) by mouth daily after supper. 90 tablet 1   fluticasone  (FLONASE ) 50 MCG/ACT nasal spray Place 2 sprays into both nostrils daily. 16 g 0   furosemide  (LASIX ) 40 MG tablet TAKE 1 TABLET BY MOUTH EVERY DAY AS NEEDED FOR FLUID *SHORTNESS OF BREATH* 30 tablet 1   ibuprofen (MOTRIN IB) 200 MG tablet Take 400 mg by mouth every 6 (six) hours as needed for mild pain or moderate pain.     levothyroxine  (SYNTHROID ) 112 MCG tablet Take 1 tablet (112 mcg total) by mouth daily. 90 tablet 3   LORazepam  (ATIVAN ) 1 MG tablet TAKE ONE TABLET BY MOUTH THREE TIMES DAILY FOR ANXIETY 90 tablet 1   meclizine  (ANTIVERT ) 12.5 MG tablet Take 1 tablet (12.5 mg total) by mouth 3 (three) times daily as needed for dizziness. 30 tablet 3   metoprolol  succinate (TOPROL -XL) 25 MG 24 hr tablet Take 1 tablet (25 mg total) by mouth daily. Take with or immediately following a meal. 90 tablet 3   montelukast  (SINGULAIR ) 10 MG tablet TAKE ONE TABLET BY MOUTH DAILY 90 tablet 0   rosuvastatin  (CRESTOR ) 20 MG tablet Take 1 tablet (20 mg total) by mouth daily after supper. 90 tablet 1   sacubitril -valsartan  (ENTRESTO ) 24-26 MG Take 1 tablet by mouth 2 (two) times daily. 180 tablet 1   testosterone cypionate (DEPO-TESTOSTERONE) 200 MG/ML injection Inject 200 mg into the muscle every 3 (three) months.     tirzepatide  (MOUNJARO ) 15 MG/0.5ML Pen Inject 15 mg into the skin once a  week. 2 mL 6   triamcinolone cream (KENALOG) 0.1 % Apply 1 Application topically 2 (two) times daily.     venlafaxine  XR (EFFEXOR  XR) 150 MG 24 hr capsule Take 1 capsule (150 mg total) by mouth daily with breakfast. 31 capsule 2   No current facility-administered medications for this visit.    PHYSICAL EXAMINATION: ECOG PERFORMANCE STATUS: 1 - Symptomatic but completely ambulatory  Vitals:   03/09/24 1536  BP: 109/65  Pulse: 94  Resp: 18  Temp: 98.3 F (36.8 C)  SpO2: 100%   Filed Weights   03/09/24 1536  Weight: 187 lb 8 oz (85 kg)    Physical Exam SKIN: Mild erythema with warmth.  (exam performed in the presence of a chaperone)  LABORATORY DATA:  I have reviewed the data as listed    Latest Ref Rng & Units 10/08/2023    9:24 AM 03/10/2023    3:05 PM 10/18/2022   11:18 AM  CMP  Glucose 70 - 99 mg/dL 867  91  87   BUN 6 - 24 mg/dL 14  19  16    Creatinine 0.57 - 1.00 mg/dL 9.24  9.07  9.08   Sodium 134 - 144 mmol/L 142  142  142   Potassium 3.5 - 5.2 mmol/L 5.0  4.8  5.5   Chloride 96 - 106 mmol/L 103  108  105   CO2 20 - 29 mmol/L 21  31  30    Calcium  8.7 - 10.2 mg/dL 9.5  9.2  9.5   Total Protein 6.5 - 8.1 g/dL  6.4  6.3   Total Bilirubin <1.2 mg/dL  0.3  0.4   Alkaline Phos 38 - 126 U/L  55    AST 15 - 41 U/L  11  14   ALT 0 - 44 U/L  9  11     Lab Results  Component Value Date   WBC 5.8 03/10/2023   HGB 12.5 03/10/2023   HCT 39.2 03/10/2023   MCV 86.5 03/10/2023   PLT 224 03/10/2023   NEUTROABS 3.5 03/10/2023    ASSESSMENT & PLAN:  Malignant neoplasm of upper-outer quadrant of right breast in female, estrogen receptor negative (HCC) 07/09/2017:Patient palpated a right breast mass which was evaluated by mammogram and ultrasound and a breast MRI which revealed 4.5 x 4 cm necrotic tumor with suspicious multiple right axillary lymph nodes; biopsy revealed IDC grade 3 ER 0%, PR 0%, HER-2 positive ratio 3.08, Ki-67 70%, T2NX stage IIa/IIb   Treatment plan  based on multidisciplinary tumor board: 1. Neoadjuvant chemotherapy with [facility] Perjeta  6 cycles followed by Herceptin  and Perjeta  maintenance for 1 year 2. Followed by bilateral mastectomies with targeted node dissection on the right 11/18/2017 3.  Due to significant residual disease, additional adjuvant chemo with Adriamycin  and Cytoxan  12/18/2017-01/27/2018 4.  Herceptin  maintenance completed 09/29/2018 ----------------------------------------------------------------------------- 11/18/2017:Bilateral mastectomies: Right mastectomy: IDC grade 3 3.8 cm margins negative, 0/4 lymph nodes negative, ER 0%, PR 0%, HER-2 negative ratio 1.3, IHC HER-2 negative; Ki-67 70%, RCB class II; left mastectomy: [facility]   Current treatment: Surveillance  No role of imaging since she had bilateral mastectomies. Chest exam 03/09/2024: Benign  Guardant reveal for MRD testing: 0   Cardiac Health Patient has a defibrillator implanted in December of the previous year due to low ejection fraction. Currently under the care of Dr. Ladona.  Return to clinic in 1 year for follow-up ------------------------------------- Assessment and Plan Assessment & Plan History of malignant neoplasm of upper-outer quadrant of right breast No new issues related to breast cancer. - Continue regular follow-up and monitoring.  Anxiety and panic attacks Increased anxiety and panic attacks likely related to stressors including disability process and upcoming surgery. - Encouraged stress management techniques and activities promoting mental well-being.  Pruritic rash on back Mild pruritic rash possibly stress-related. - Consider Benadryl  for symptomatic relief.  Dizziness, managed with meclizine  Dizziness managed with meclizine . - Refilled meclizine  prescription.  Lumbar degenerative disc disease, pending spinal fusion Scheduled for spinal fusion surgery on May 27th, 2026. Concerns about procedure impact on daily life. - Proceed with  scheduled spinal fusion surgery on May 27th, 2026.      No orders of the defined types were placed in this encounter.  The patient has a good understanding of the overall plan. she agrees with it. she will call with any problems that may develop before the next visit here.  I personally spent a total of 30 minutes in the care of the patient today including preparing to see the patient, getting/reviewing separately obtained history, performing a medically appropriate exam/evaluation, counseling and educating, placing orders, referring and communicating with other health care professionals, documenting clinical information in the EHR, independently interpreting results, communicating results, and coordinating  care.   Viinay K Fajr Fife, MD 03/09/24

## 2024-03-09 NOTE — Assessment & Plan Note (Signed)
 07/09/2017:Patient palpated a right breast mass which was evaluated by mammogram and ultrasound and a breast MRI which revealed 4.5 x 4 cm necrotic tumor with suspicious multiple right axillary lymph nodes; biopsy revealed IDC grade 3 ER 0%, PR 0%, HER-2 positive ratio 3.08, Ki-67 70%, T2NX stage IIa/IIb   Treatment plan based on multidisciplinary tumor board: 1. Neoadjuvant chemotherapy with TCH Perjeta  6 cycles followed by Herceptin  and Perjeta  maintenance for 1 year 2. Followed by bilateral mastectomies with targeted node dissection on the right 11/18/2017 3.  Due to significant residual disease, additional adjuvant chemo with Adriamycin  and Cytoxan  12/18/2017-01/27/2018 4.  Herceptin  maintenance completed 09/29/2018 ----------------------------------------------------------------------------- 11/18/2017:Bilateral mastectomies: Right mastectomy: IDC grade 3 3.8 cm margins negative, 0/4 lymph nodes negative, ER 0%, PR 0%, HER-2 negative ratio 1.3, IHC HER-2 negative; Ki-67 70%, RCB class II; left mastectomy: [facility]   Current treatment: Surveillance  No role of imaging since she had bilateral mastectomies. Chest exam 03/09/2024: Benign  Guardant reveal for MRD testing: 0   Cardiac Health Patient has a defibrillator implanted in December of the previous year due to low ejection fraction. Currently under the care of Dr. Ladona.  Return to clinic in 1 year for follow-up

## 2024-03-21 ENCOUNTER — Other Ambulatory Visit: Payer: Self-pay | Admitting: Internal Medicine

## 2024-04-01 ENCOUNTER — Telehealth: Payer: Self-pay

## 2024-04-01 NOTE — Telephone Encounter (Signed)
 Called pt per MD to advise Guardant Reveal testing was negative/not detected. Pt verbalized understanding of results and knows [employer] will be in touch to schedule 6 mo repeat lab.

## 2024-04-05 ENCOUNTER — Encounter: Payer: Self-pay | Admitting: Hematology and Oncology

## 2024-04-05 ENCOUNTER — Ambulatory Visit: Payer: 59

## 2024-04-06 ENCOUNTER — Other Ambulatory Visit: Payer: Self-pay | Admitting: Neurological Surgery

## 2024-04-06 LAB — CUP PACEART REMOTE DEVICE CHECK
Battery Remaining Longevity: 76 mo
Battery Voltage: 2.97 V
Brady Statistic AP VP Percent: 0.04 %
Brady Statistic AP VS Percent: 0.01 %
Brady Statistic AS VP Percent: 99.76 %
Brady Statistic AS VS Percent: 0.19 %
Brady Statistic RA Percent Paced: 0.05 %
Brady Statistic RV Percent Paced: 99.79 %
Date Time Interrogation Session: 20251207193107
HighPow Impedance: 71 Ohm
Implantable Lead Connection Status: 753985
Implantable Lead Connection Status: 753985
Implantable Lead Connection Status: 753985
Implantable Lead Implant Date: 20231208
Implantable Lead Implant Date: 20231208
Implantable Lead Implant Date: 20231208
Implantable Lead Location: 753858
Implantable Lead Location: 753859
Implantable Lead Location: 753860
Implantable Lead Model: 5076
Implantable Lead Model: 6935
Implantable Pulse Generator Implant Date: 20231208
Lead Channel Impedance Value: 266 Ohm
Lead Channel Impedance Value: 285 Ohm
Lead Channel Impedance Value: 399 Ohm
Lead Channel Impedance Value: 399 Ohm
Lead Channel Impedance Value: 494 Ohm
Lead Channel Impedance Value: 513 Ohm
Lead Channel Pacing Threshold Amplitude: 0.5 V
Lead Channel Pacing Threshold Amplitude: 0.5 V
Lead Channel Pacing Threshold Amplitude: 0.5 V
Lead Channel Pacing Threshold Pulse Width: 0.4 ms
Lead Channel Pacing Threshold Pulse Width: 0.4 ms
Lead Channel Pacing Threshold Pulse Width: 0.4 ms
Lead Channel Sensing Intrinsic Amplitude: 12.8 mV
Lead Channel Sensing Intrinsic Amplitude: 3.6 mV
Lead Channel Setting Pacing Amplitude: 2 V
Lead Channel Setting Pacing Amplitude: 2 V
Lead Channel Setting Pacing Amplitude: 2 V
Lead Channel Setting Pacing Pulse Width: 0.4 ms
Lead Channel Setting Pacing Pulse Width: 0.4 ms
Lead Channel Setting Sensing Sensitivity: 0.3 mV
Zone Setting Status: 755011
Zone Setting Status: 755011

## 2024-04-07 ENCOUNTER — Ambulatory Visit: Payer: Self-pay | Admitting: Internal Medicine

## 2024-04-08 NOTE — Telephone Encounter (Signed)
 done

## 2024-04-12 NOTE — Progress Notes (Signed)
 Surgical Instructions   Your procedure is scheduled on Thursday April 15, 2024. Report to [facility] Main Entrance A at 5:30 A.M., then check in with the Admitting office. Any questions or running late day of surgery: call [phone]  Questions prior to your surgery date: call [phone], Monday-Friday, 8am-4pm. If you experience any cold or flu symptoms such as cough, fever, chills, shortness of breath, etc. between now and your scheduled surgery, please notify us  at the above number.     Remember:  Do not eat or drink after midnight the night before your surgery  Take these medicines the morning of surgery with A SIP OF WATER  levothyroxine  (SYNTHROID )  metoprolol  succinate (TOPROL -XL)  montelukast  (SINGULAIR )  venlafaxine  XR (EFFEXOR -XR)    May take these medicines IF NEEDED: LORazepam  (ATIVAN )  meclizine  (ANTIVERT )   One week prior to surgery, STOP taking any Aspirin  (unless otherwise instructed by your surgeon) Aleve , Naproxen , Ibuprofen, Motrin, Advil, Goody's, BC's, all herbal medications, fish oil, and non-prescription vitamins.  WHAT DO I DO ABOUT MY DIABETES MEDICATION?   Do not take oral diabetes medicines (pills) the morning of surgery.        DO NOT TAKE YOUR tirzepatide  (MOUNJARO ) 7 DAYS PRIOR TO SURGERY WITH THE LAST DOSE BEING NO LATER THAN 04/07/2024.     The day of surgery, do not take other diabetes injectables, including Byetta (exenatide), Bydureon (exenatide ER), Victoza  (liraglutide ), or Trulicity (dulaglutide).  If your CBG is greater than 220 mg/dL, you may take  of your sliding scale (correction) dose of insulin .   HOW TO MANAGE YOUR DIABETES BEFORE AND AFTER SURGERY  Why is it important to control my blood sugar before and after surgery? Improving blood sugar levels before and after surgery helps healing and can limit problems. A way of improving blood sugar control is eating a healthy diet by:  Eating less sugar and carbohydrates   Increasing activity/exercise  Talking with your doctor about reaching your blood sugar goals High blood sugars (greater than 180 mg/dL) can raise your risk of infections and slow your recovery, so you will need to focus on controlling your diabetes during the weeks before surgery. Make sure that the doctor who takes care of your diabetes knows about your planned surgery including the date and location.  How do I manage my blood sugar before surgery? Check your blood sugar at least 4 times a day, starting 2 days before surgery, to make sure that the level is not too high or low.  Check your blood sugar the morning of your surgery when you wake up and every 2 hours until you get to the Short Stay unit.  If your blood sugar is less than 70 mg/dL, you will need to treat for low blood sugar: Do not take insulin . Treat a low blood sugar (less than 70 mg/dL) with  cup of clear juice (cranberry or apple), 4 glucose tablets, OR glucose gel. Recheck blood sugar in 15 minutes after treatment (to make sure it is greater than 70 mg/dL). If your blood sugar is not greater than 70 mg/dL on recheck, call [phone] for further instructions. Report your blood sugar to the short stay nurse when you get to Short Stay.  If you are admitted to the hospital after surgery: Your blood sugar will be checked by the staff and you will probably be given insulin  after surgery (instead of oral diabetes medicines) to make sure you have good blood sugar levels. The goal for blood sugar control  after surgery is 80-180 mg/dL.                      Do NOT Smoke (Tobacco/Vaping) for 24 hours prior to your procedure.  If you use a CPAP at night, you may bring your mask/headgear for your overnight stay.   You will be asked to remove any contacts, glasses, piercing's, hearing aid's, dentures/partials prior to surgery. Please bring cases for these items if needed.    Patients discharged the day of surgery will not be allowed  to drive home, and someone needs to stay with them for 24 hours.  SURGICAL WAITING ROOM VISITATION Patients may have no more than 2 support people in the waiting area - these visitors may rotate.   Pre-op nurse will coordinate an appropriate time for 1 ADULT support person, who may not rotate, to accompany patient in pre-op.  Children under the age of 55 must have an adult with them who is not the patient and must remain in the main waiting area with an adult.  If the patient needs to stay at the hospital during part of their recovery, the visitor guidelines for inpatient rooms apply.  Please refer to the [facility] website for the visitor guidelines for any additional information.   If you received a COVID test during your pre-op visit  it is requested that you wear a mask when out in public, stay away from anyone that may not be feeling well and notify your surgeon if you develop symptoms. If you have been in contact with anyone that has tested positive in the last 10 days please notify you surgeon.      Pre-operative 4 CHG Bathing Instructions   You can play a key role in reducing the risk of infection after surgery. Your skin needs to be as free of germs as possible. You can reduce the number of germs on your skin by washing with CHG (chlorhexidine  gluconate) soap before surgery. CHG is an antiseptic soap that kills germs and continues to kill germs even after washing.   DO NOT use if you have an allergy to chlorhexidine /CHG or antibacterial soaps. If your skin becomes reddened or irritated, stop using the CHG and notify one of our RNs at [phone].   Please shower with the CHG soap starting 4 days before surgery using the following schedule:     Please keep in mind the following:  DO NOT shave, including legs and underarms, starting the day of your first shower.   Place clean sheets on your bed the day you start using CHG soap. Use a clean washcloth (not used since being  washed) for each shower. DO NOT sleep with pets once you start using the CHG.   CHG Shower Instructions:  Wash your face and private area with normal soap. If you choose to wash your hair, wash first with your normal shampoo.  After you use shampoo/soap, rinse your hair and body thoroughly to remove shampoo/soap residue.  Turn the water OFF and apply  bottle of CHG soap to a CLEAN washcloth.  Apply CHG soap ONLY FROM YOUR NECK DOWN TO YOUR TOES (washing for 3-5 minutes)  DO NOT use CHG soap on face, private areas, open wounds, or sores.  Pay special attention to the area where your surgery is being performed.  If you are having back surgery, having someone wash your back for you may be helpful. Wait 2 minutes after CHG soap is applied, then you  may rinse off the CHG soap.  Pat dry with a clean towel  Put on clean clothes/pajamas   If you choose to wear lotion, please use ONLY the CHG-compatible lotions that are listed below.  Additional instructions for the day of surgery:  If you choose, you may shower the morning of surgery with an antibacterial soap.  DO NOT APPLY any lotions, deodorants or perfumes.   Do not bring valuables to the hospital. [facility] is not responsible for any belongings/valuables. Do not wear nail polish, gel polish, artificial nails, or any other type of covering on natural nails (fingers and toes) Do not wear jewelry or makeup Put on clean/comfortable clothes.  Please brush your teeth.  Ask your nurse before applying any prescription medications to the skin.     CHG Compatible Lotions   Aveeno Moisturizing lotion  Cetaphil Moisturizing Cream  Cetaphil Moisturizing Lotion  Clairol Herbal Essence Moisturizing Lotion, Dry Skin  Clairol Herbal Essence Moisturizing Lotion, Extra Dry Skin  Clairol Herbal Essence Moisturizing Lotion, Normal Skin  Curel Age Defying Therapeutic Moisturizing Lotion with Alpha Hydroxy  Curel Extreme Care Body Lotion  Curel  Soothing Hands Moisturizing Hand Lotion  Curel Therapeutic Moisturizing Cream, Fragrance-Free  Curel Therapeutic Moisturizing Lotion, Fragrance-Free  Curel Therapeutic Moisturizing Lotion, Original Formula  Eucerin Daily Replenishing Lotion  Eucerin Dry Skin Therapy Plus Alpha Hydroxy Crme  Eucerin Dry Skin Therapy Plus Alpha Hydroxy Lotion  Eucerin Original Crme  Eucerin Original Lotion  Eucerin Plus Crme Eucerin Plus Lotion  Eucerin TriLipid Replenishing Lotion  Keri Anti-Bacterial Hand Lotion  Keri Deep Conditioning Original Lotion Dry Skin Formula Softly Scented  Keri Deep Conditioning Original Lotion, Fragrance Free Sensitive Skin Formula  Keri Lotion Fast Absorbing Fragrance Free Sensitive Skin Formula  Keri Lotion Fast Absorbing Softly Scented Dry Skin Formula  Keri Original Lotion  Keri Skin Renewal Lotion Keri Silky Smooth Lotion  Keri Silky Smooth Sensitive Skin Lotion  Nivea Body Creamy Conditioning Oil  Nivea Body Extra Enriched Lotion  Nivea Body Original Lotion  Nivea Body Sheer Moisturizing Lotion Nivea Crme  Nivea Skin Firming Lotion  NutraDerm 30 Skin Lotion  NutraDerm Skin Lotion  NutraDerm Therapeutic Skin Cream  NutraDerm Therapeutic Skin Lotion  ProShield Protective Hand Cream  Provon moisturizing lotion  Please read over the following fact sheets that you were given.

## 2024-04-13 ENCOUNTER — Other Ambulatory Visit: Payer: Self-pay

## 2024-04-13 ENCOUNTER — Telehealth: Payer: Self-pay | Admitting: Pharmacy Technician

## 2024-04-13 ENCOUNTER — Inpatient Hospital Stay (HOSPITAL_COMMUNITY): Admission: RE | Admit: 2024-04-13 | Discharge: 2024-04-13 | Attending: Neurological Surgery

## 2024-04-13 ENCOUNTER — Encounter (HOSPITAL_COMMUNITY): Payer: Self-pay

## 2024-04-13 VITALS — BP 103/74 | HR 83 | Temp 97.8°F | Resp 18 | Ht 69.0 in | Wt 185.0 lb

## 2024-04-13 DIAGNOSIS — Z01812 Encounter for preprocedural laboratory examination: Secondary | ICD-10-CM | POA: Insufficient documentation

## 2024-04-13 DIAGNOSIS — Z9013 Acquired absence of bilateral breasts and nipples: Secondary | ICD-10-CM | POA: Insufficient documentation

## 2024-04-13 DIAGNOSIS — Z79899 Other long term (current) drug therapy: Secondary | ICD-10-CM | POA: Insufficient documentation

## 2024-04-13 DIAGNOSIS — Z853 Personal history of malignant neoplasm of breast: Secondary | ICD-10-CM | POA: Diagnosis not present

## 2024-04-13 DIAGNOSIS — F410 Panic disorder [episodic paroxysmal anxiety] without agoraphobia: Secondary | ICD-10-CM | POA: Insufficient documentation

## 2024-04-13 DIAGNOSIS — Z01818 Encounter for other preprocedural examination: Secondary | ICD-10-CM

## 2024-04-13 DIAGNOSIS — I427 Cardiomyopathy due to drug and external agent: Secondary | ICD-10-CM | POA: Diagnosis not present

## 2024-04-13 DIAGNOSIS — E119 Type 2 diabetes mellitus without complications: Secondary | ICD-10-CM | POA: Insufficient documentation

## 2024-04-13 DIAGNOSIS — T451X5A Adverse effect of antineoplastic and immunosuppressive drugs, initial encounter: Secondary | ICD-10-CM | POA: Insufficient documentation

## 2024-04-13 DIAGNOSIS — Z95810 Presence of automatic (implantable) cardiac defibrillator: Secondary | ICD-10-CM | POA: Diagnosis not present

## 2024-04-13 DIAGNOSIS — K449 Diaphragmatic hernia without obstruction or gangrene: Secondary | ICD-10-CM | POA: Insufficient documentation

## 2024-04-13 DIAGNOSIS — I2510 Atherosclerotic heart disease of native coronary artery without angina pectoris: Secondary | ICD-10-CM | POA: Diagnosis not present

## 2024-04-13 DIAGNOSIS — K219 Gastro-esophageal reflux disease without esophagitis: Secondary | ICD-10-CM | POA: Insufficient documentation

## 2024-04-13 DIAGNOSIS — Z7985 Long-term (current) use of injectable non-insulin antidiabetic drugs: Secondary | ICD-10-CM | POA: Diagnosis not present

## 2024-04-13 DIAGNOSIS — Z7982 Long term (current) use of aspirin: Secondary | ICD-10-CM | POA: Insufficient documentation

## 2024-04-13 DIAGNOSIS — I447 Left bundle-branch block, unspecified: Secondary | ICD-10-CM | POA: Insufficient documentation

## 2024-04-13 LAB — CBC
HCT: 43.3 % (ref 36.0–46.0)
Hemoglobin: 13.9 g/dL (ref 12.0–15.0)
MCH: 27.2 pg (ref 26.0–34.0)
MCHC: 32.1 g/dL (ref 30.0–36.0)
MCV: 84.7 fL (ref 80.0–100.0)
Platelets: 206 K/uL (ref 150–400)
RBC: 5.11 MIL/uL (ref 3.87–5.11)
RDW: 13.2 % (ref 11.5–15.5)
WBC: 5.7 K/uL (ref 4.0–10.5)
nRBC: 0 % (ref 0.0–0.2)

## 2024-04-13 LAB — BASIC METABOLIC PANEL WITH GFR
Anion gap: 8 (ref 5–15)
BUN: 14 mg/dL (ref 6–20)
CO2: 28 mmol/L (ref 22–32)
Calcium: 9.6 mg/dL (ref 8.9–10.3)
Chloride: 103 mmol/L (ref 98–111)
Creatinine, Ser: 0.8 mg/dL (ref 0.44–1.00)
GFR, Estimated: >60 mL/min (ref >60)
Glucose, Bld: 90 mg/dL (ref 70–99)
Potassium: 5.2 mmol/L — ABNORMAL HIGH (ref 3.5–5.1)
Sodium: 138 mmol/L (ref 135–145)

## 2024-04-13 LAB — SURGICAL PCR SCREEN
MRSA, PCR: NEGATIVE
Staphylococcus aureus: NEGATIVE

## 2024-04-13 LAB — TYPE AND SCREEN
ABO/RH(D): A POS
Antibody Screen: NEGATIVE

## 2024-04-13 LAB — GLUCOSE, CAPILLARY: Glucose-Capillary: 99 mg/dL (ref 70–99)

## 2024-04-13 NOTE — Telephone Encounter (Signed)
° °  Pharmacy Patient Advocate Encounter   Received notification from CoverMyMeds that prior authorization for mounjaro  is required/requested.   Insurance verification completed.   The patient is insured through [facility].   Per pa

## 2024-04-13 NOTE — Progress Notes (Signed)
 Remote ICD Transmission

## 2024-04-13 NOTE — Progress Notes (Signed)
 PCP - Perri Ronal PARAS, MD   Cardiologist - Gordy Bergamo, MD  Electrophysiologist: Danelle Birmingham, MD    PPM/ICD - Medtronic BiV ICD implanted 04/05/2022 for NICM  Device Orders - yes Rep Notified - yes  Chest x-ray -  EKG - 02-26-24 Stress Test - 09-08-13 ECHO - 01-02-22 Cardiac Cath -   Sleep Study - denies CPAP - n/a  Fasting Blood Sugar - Does not check blood sugar regularly per patient when she starts to feel bad she does.  Blood sugar at PAT  was 99   Last dose of GLP1 agonist-  tirzepatide  (MOUNJARO )  GLP1 instructions: Last dose 04-05-24  Blood Thinner Instructions:denies Aspirin  Instructions:denies  ERAS Protcol -NPO  COVID TEST- n/a  Anesthesia review: Yes, Cardiac history, has ICD  Patient denies shortness of breath, fever, cough and chest pain at PAT appointment   All instructions explained to the patient, with a verbal understanding of the material. Patient agrees to go over the instructions while at home for a better understanding. Patient also instructed to self quarantine after being tested for COVID-19. The opportunity to ask questions was provided.

## 2024-04-14 NOTE — Anesthesia Preprocedure Evaluation (Signed)
 Anesthesia Evaluation  Patient identified by MRN, date of birth, ID band Patient awake    Reviewed: Allergy & Precautions, NPO status , Patient's Chart, lab work & pertinent test results  Airway Mallampati: II  TM Distance: >3 FB Neck ROM: Full    Dental no notable dental hx.    Pulmonary former smoker   Pulmonary exam normal        Cardiovascular hypertension, + CAD  + dysrhythmias + Cardiac Defibrillator  Rhythm:Regular Rate:Normal     Neuro/Psych  Headaches  Anxiety        GI/Hepatic hiatal hernia,GERD  ,,  Endo/Other  diabetesHypothyroidism    Renal/GU   negative genitourinary   Musculoskeletal  (+) Arthritis , Osteoarthritis,    Abdominal Normal abdominal exam  (+)   Peds  Hematology Lab Results      Component                Value               Date                      WBC                      5.7                 04/13/2024                HGB                      13.9                04/13/2024                HCT                      43.3                04/13/2024                MCV                      84.7                04/13/2024                PLT                      206                 04/13/2024             Lab Results      Component                Value               Date                      NA                       138                 04/13/2024                K  5.2 (H)             04/13/2024                CO2                      28                  04/13/2024                GLUCOSE                  90                  04/13/2024                BUN                      14                  04/13/2024                CREATININE               0.80                04/13/2024                CALCIUM                   9.6                 04/13/2024                EGFR                     95                  10/08/2023                GFRNONAA                 >60                  04/13/2024              Anesthesia Other Findings s/p biventricular ICD implant with Medtronic CRT-D.  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  LVEF normalized with BiV ICD.  EF improved from 25 to 30% in 2020 to 55 to 60% currently  Reproductive/Obstetrics                              Anesthesia Physical Anesthesia Plan  ASA: 3  Anesthesia Plan: General   Post-op Pain Management: Tylenol  PO (pre-op)* and Celebrex  PO (pre-op)*   Induction: Intravenous  PONV Risk Score and Plan: 3 and Ondansetron , Dexamethasone , Midazolam  and Treatment may vary due to age or medical condition  Airway Management Planned: Mask and Oral ETT  Additional Equipment: None  Intra-op Plan:   Post-operative Plan: Extubation in OR  Informed Consent: I have reviewed the patients History and Physical, chart, labs and discussed the procedure including the risks, benefits and alternatives for the proposed anesthesia with the patient or authorized representative who has indicated his/her understanding and acceptance.     Dental advisory given  Plan Discussed with: CRNA  Anesthesia Plan Comments: (PAT note by Lynwood Hope, PA-C:  53 year old female follows with cardiology for history of mild nonobstructive CAD by CTA 09/2023, LBBB, severe LV systolic function s/p biventricular ICD implant with Medtronic CRT-D.  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  LVEF normalized with BiV ICD.  EF improved from 25 to 30% in 2020 to 55 to 60% currently.  She was seen by Damien Braver, NP on 02/20/2024 for preop evaluation.  Per note, According to the Revised Cardiac Risk Index (RCRI), her Perioperative Risk of Major Cardiac Event is (%): 0.4. Her Functional Capacity in METs is: 7.01 according to the Duke Activity Status Index (DASI). Therefore, based on ACC/AHA guidelines, patient would be at acceptable risk for the planned procedure  without further cardiovascular testing. The patient was advised that if she develops new symptoms prior to surgery to contact our office to arrange for a follow-up visit, and she verbalized understanding. Per office protocol, she may hold Aspirin  for 5-7 days prior to procedure. Please resume Aspirin  as soon as possible postprocedure, at the discretion of the surgeon.  She was seen in EP cardiology follow-up by Daphne Barrack, NP on 02/26/2024 and noted to be doing well, euvolemic on exam, 99.3% BiV pacing, no changes to management.  Other pertinent history includes hyperthyroidism s/p RAI therapy 06/2016, GERD on PPI, hiatal hernia, anxiety/panic attacks, non-insulin -dependent DM2 (A1c 5.7 on 02/13/2024), right breast cancer s/p bilateral mastectomy and adjuvant chemotherapy  Patient reports last dose of Mounjaro  04/05/2024.  Preop labs reviewed, unremarkable.  EKG 02/26/2024: Atrial sensed ventricular paced rhythm.  Rate 71.  Perioperative prescription for implanted cardiac device programming per progress note 02/26/2024: Device Information:  Clinic EP Physician:  Danelle Birmingham, MD   Device Type:  Defibrillator Manufacturer and Phone #:  Medtronic: [id number] Pacemaker Dependent?:  No. Date of Last Device Check:  02/26/24         Normal Device Function?:  Yes.    Electrophysiologist's Recommendations:   Have magnet available.  Provide continuous ECG monitoring when magnet is used or reprogramming is to be performed.   Procedure will likely interfere with device function.  Device should be programmed:  Tachy therapies disabled  Coronary CTA 10/15/2023: IMPRESSION: 1. Coronary calcium  score of 132. This was 41 percentile for age-, sex, and race-matched controls.  2. Total plaque volume 152 mm3 which is 77 percentile for age- and sex-matched controls (calcified plaque 38 mm3; non-calcified plaque 114 mm3). TPV is (moderate).  3. Right dominance. 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  4. Mild (25-49) calcified plaque in the proximal LAD and at takeoff of diagonal.  5. ICD leads noted.  6. Mildly dilated pulmonary artery (3.1 cm).  RECOMMENDATIONS: CAD-RADS 2: Mild non-obstructive CAD (25-49%). Consider non-atherosclerotic causes of chest pain. Consider preventive therapy and risk factor modification.  )         Anesthesia Quick Evaluation

## 2024-04-14 NOTE — Progress Notes (Signed)
 Anesthesia Chart Review:  53 year old female follows with cardiology for history of mild nonobstructive CAD by CTA 09/2023, LBBB, severe LV systolic function s/p biventricular ICD implant with Medtronic CRT-D.  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  LVEF normalized with BiV ICD.  EF improved from 25 to 30% in 2020 to 55 to 60% currently.  She was seen by Damien Braver, NP on 02/20/2024 for preop evaluation.  Per note, According to the Revised Cardiac Risk Index (RCRI), her Perioperative Risk of Major Cardiac Event is (%): 0.4. Her Functional Capacity in METs is: 7.01 according to the Duke Activity Status Index (DASI). Therefore, based on ACC/AHA guidelines, patient would be at acceptable risk for the planned procedure without further cardiovascular testing. The patient was advised that if she develops new symptoms prior to surgery to contact our office to arrange for a follow-up visit, and she verbalized understanding. Per office protocol, she may hold Aspirin  for 5-7 days prior to procedure. Please resume Aspirin  as soon as possible postprocedure, at the discretion of the surgeon.  She was seen in EP cardiology follow-up by Daphne Barrack, NP on 02/26/2024 and noted to be doing well, euvolemic on exam, 99.3% BiV pacing, no changes to management.  Other pertinent history includes hyperthyroidism s/p RAI therapy 06/2016, GERD on PPI, hiatal hernia, anxiety/panic attacks, non-insulin -dependent DM2 (A1c 5.7 on 02/13/2024), right breast cancer s/p bilateral mastectomy and adjuvant chemotherapy  Patient reports last dose of Mounjaro  04/05/2024.  Preop labs reviewed, unremarkable.  EKG 02/26/2024: Atrial sensed ventricular paced rhythm.  Rate 71.  Perioperative prescription for implanted cardiac device programming per progress note 02/26/2024: Device Information:   Clinic EP Physician:  Danelle Birmingham, MD    Device Type:  Defibrillator Manufacturer and Phone #:  Medtronic:  [phone] Pacemaker Dependent?:  No. Date of Last Device Check:  02/26/24         Normal Device Function?:  Yes.     Electrophysiologist's Recommendations:   Have magnet available. Provide continuous ECG monitoring when magnet is used or reprogramming is to be performed.  Procedure will likely interfere with device function.  Device should be programmed:  Tachy therapies disabled  Coronary CTA 10/15/2023: IMPRESSION: 1. Coronary calcium  score of 132. This was 24 percentile for age-, sex, and race-matched controls.   2. Total plaque volume 152 mm3 which is 77 percentile for age- and sex-matched controls (calcified plaque 38 mm3; non-calcified plaque 114 mm3). TPV is (moderate).   3. Right dominance. Right coronary artery originates from left coronary cusp with course between pulmonary artery and aorta.   4. Mild (25-49) calcified plaque in the proximal LAD and at takeoff of diagonal.   5. ICD leads noted.   6. Mildly dilated pulmonary artery (3.1 cm).   RECOMMENDATIONS: CAD-RADS 2: Mild non-obstructive CAD (25-49%). Consider non-atherosclerotic causes of chest pain. Consider preventive therapy and risk factor modification.      Lynwood Geofm RIGGERS [facility]/Anesthesiology Phone [phone] 04/14/2024 9:53 AM

## 2024-04-15 ENCOUNTER — Ambulatory Visit (HOSPITAL_COMMUNITY)

## 2024-04-15 ENCOUNTER — Ambulatory Visit (HOSPITAL_COMMUNITY): Admitting: Anesthesiology

## 2024-04-15 ENCOUNTER — Ambulatory Visit (HOSPITAL_COMMUNITY): Admission: RE | Payer: Self-pay | Source: Home / Self Care

## 2024-04-15 ENCOUNTER — Ambulatory Visit (HOSPITAL_COMMUNITY): Admitting: Physician Assistant

## 2024-04-15 ENCOUNTER — Other Ambulatory Visit: Payer: Self-pay

## 2024-04-15 ENCOUNTER — Observation Stay (HOSPITAL_COMMUNITY)
Admission: RE | Admit: 2024-04-15 | Discharge: 2024-04-16 | Disposition: A | Discharge status: Home / Self Care | Attending: Neurological Surgery | Admitting: Neurological Surgery

## 2024-04-15 ENCOUNTER — Encounter (HOSPITAL_COMMUNITY): Payer: Self-pay | Admitting: Neurological Surgery

## 2024-04-15 DIAGNOSIS — M4727 Other spondylosis with radiculopathy, lumbosacral region: Secondary | ICD-10-CM | POA: Insufficient documentation

## 2024-04-15 DIAGNOSIS — I2510 Atherosclerotic heart disease of native coronary artery without angina pectoris: Secondary | ICD-10-CM | POA: Diagnosis not present

## 2024-04-15 DIAGNOSIS — Z87891 Personal history of nicotine dependence: Secondary | ICD-10-CM | POA: Insufficient documentation

## 2024-04-15 DIAGNOSIS — E039 Hypothyroidism, unspecified: Secondary | ICD-10-CM | POA: Diagnosis not present

## 2024-04-15 DIAGNOSIS — M4316 Spondylolisthesis, lumbar region: Secondary | ICD-10-CM | POA: Diagnosis not present

## 2024-04-15 DIAGNOSIS — Z853 Personal history of malignant neoplasm of breast: Secondary | ICD-10-CM | POA: Insufficient documentation

## 2024-04-15 DIAGNOSIS — M4317 Spondylolisthesis, lumbosacral region: Secondary | ICD-10-CM | POA: Insufficient documentation

## 2024-04-15 DIAGNOSIS — M5117 Intervertebral disc disorders with radiculopathy, lumbosacral region: Secondary | ICD-10-CM | POA: Diagnosis not present

## 2024-04-15 DIAGNOSIS — Z79899 Other long term (current) drug therapy: Secondary | ICD-10-CM | POA: Diagnosis not present

## 2024-04-15 DIAGNOSIS — E119 Type 2 diabetes mellitus without complications: Secondary | ICD-10-CM | POA: Insufficient documentation

## 2024-04-15 DIAGNOSIS — I10 Essential (primary) hypertension: Secondary | ICD-10-CM | POA: Diagnosis not present

## 2024-04-15 DIAGNOSIS — M5440 Lumbago with sciatica, unspecified side: Secondary | ICD-10-CM | POA: Diagnosis present

## 2024-04-15 LAB — GLUCOSE, CAPILLARY
Glucose-Capillary: 104 mg/dL — ABNORMAL HIGH (ref 70–99)
Glucose-Capillary: 178 mg/dL — ABNORMAL HIGH (ref 70–99)
Glucose-Capillary: 127 mg/dL — ABNORMAL HIGH (ref 70–99)
Glucose-Capillary: 176 mg/dL — ABNORMAL HIGH (ref 70–99)

## 2024-04-15 SURGERY — POSTERIOR LUMBAR FUSION 2 LEVEL
Anesthesia: General | Site: Back

## 2024-04-15 MED ORDER — PHENOL 1.4 % MT LIQD: 1.0000 | OROMUCOSAL | Status: DC | PRN

## 2024-04-15 MED ORDER — EPHEDRINE SULFATE-NACL 50-0.9 MG/10ML-% IV SOSY
PREFILLED_SYRINGE | INTRAVENOUS | Status: DC | PRN
  Administered 2024-04-15: 10:00:00 5 mg via INTRAVENOUS

## 2024-04-15 MED ORDER — LIDOCAINE-EPINEPHRINE 1 %-1:100000 IJ SOLN
INTRAMUSCULAR | Status: DC | PRN
  Administered 2024-04-15: 09:00:00 5 mL

## 2024-04-15 MED ORDER — SODIUM CHLORIDE 0.9% FLUSH: 3.0000 mL | INTRAVENOUS | Status: DC | PRN

## 2024-04-15 MED ORDER — ACETAMINOPHEN 500 MG PO TABS
1000.0000 mg | ORAL_TABLET | Freq: Once | ORAL | Status: AC
  Administered 2024-04-15: 07:00:00 1000 mg via ORAL
  Filled 2024-04-15: qty 2

## 2024-04-15 MED ORDER — METHOCARBAMOL 1000 MG/10ML IJ SOLN: 500.0000 mg | Freq: Four times a day (QID) | INTRAMUSCULAR | Status: DC | PRN

## 2024-04-15 MED ORDER — HYDROMORPHONE HCL 1 MG/ML IJ SOLN
1.0000 mg | INTRAMUSCULAR | Status: DC | PRN
  Administered 2024-04-15: 22:00:00 1 mg via INTRAVENOUS
  Filled 2024-04-15: qty 1

## 2024-04-15 MED ORDER — 0.9 % SODIUM CHLORIDE (POUR BTL) OPTIME
TOPICAL | Status: DC | PRN
  Administered 2024-04-15: 08:00:00 1000 mL

## 2024-04-15 MED ORDER — FENTANYL CITRATE (PF) 250 MCG/5ML IJ SOLN
INTRAMUSCULAR | Status: AC
  Filled 2024-04-15: qty 5

## 2024-04-15 MED ORDER — CHLORHEXIDINE GLUCONATE CLOTH 2 % EX PADS: 6.0000 | MEDICATED_PAD | Freq: Once | CUTANEOUS | Status: DC

## 2024-04-15 MED ORDER — POLYETHYLENE GLYCOL 3350 17 G PO PACK: 17.0000 g | PACK | Freq: Every day | ORAL | Status: DC | PRN

## 2024-04-15 MED ORDER — VENLAFAXINE HCL ER 75 MG PO CP24: 150.0000 mg | ORAL_CAPSULE | Freq: Every day | ORAL | Status: DC

## 2024-04-15 MED ORDER — DROPERIDOL 2.5 MG/ML IJ SOLN
INTRAMUSCULAR | Status: AC
  Filled 2024-04-15: qty 2

## 2024-04-15 MED ORDER — METOPROLOL SUCCINATE ER 25 MG PO TB24
25.0000 mg | ORAL_TABLET | Freq: Every day | ORAL | Status: DC
  Administered 2024-04-15: 21:00:00 25 mg via ORAL
  Filled 2024-04-15: qty 1

## 2024-04-15 MED ORDER — PROPOFOL 10 MG/ML IV BOLUS
INTRAVENOUS | Status: DC | PRN
  Administered 2024-04-15: 08:00:00 150 mg via INTRAVENOUS

## 2024-04-15 MED ORDER — CEFAZOLIN SODIUM-DEXTROSE 2-4 GM/100ML-% IV SOLN
2.0000 g | Freq: Three times a day (TID) | INTRAVENOUS | Status: AC
  Administered 2024-04-15 (×2): 2 g via INTRAVENOUS
  Filled 2024-04-15 (×2): qty 100

## 2024-04-15 MED ORDER — SODIUM CHLORIDE 0.9 % IV SOLN: 250.0000 mL | INTRAVENOUS | Status: DC

## 2024-04-15 MED ORDER — MONTELUKAST SODIUM 10 MG PO TABS
10.0000 mg | ORAL_TABLET | Freq: Every day | ORAL | Status: DC
  Administered 2024-04-15: 18:00:00 10 mg via ORAL
  Filled 2024-04-15: qty 1

## 2024-04-15 MED ORDER — CEFAZOLIN SODIUM-DEXTROSE 2-4 GM/100ML-% IV SOLN
2.0000 g | INTRAVENOUS | Status: AC
  Administered 2024-04-15: 08:00:00 2 g via INTRAVENOUS
  Filled 2024-04-15: qty 100

## 2024-04-15 MED ORDER — OXYCODONE-ACETAMINOPHEN 5-325 MG PO TABS
1.0000 | ORAL_TABLET | Freq: Four times a day (QID) | ORAL | Status: DC | PRN
  Administered 2024-04-15 – 2024-04-16 (×3): 2 via ORAL
  Filled 2024-04-15 (×4): qty 2

## 2024-04-15 MED ORDER — SUGAMMADEX SODIUM 200 MG/2ML IV SOLN
INTRAVENOUS | Status: DC | PRN
  Administered 2024-04-15: 13:00:00 200 mg via INTRAVENOUS

## 2024-04-15 MED ORDER — MECLIZINE HCL 12.5 MG PO TABS: 12.5000 mg | ORAL_TABLET | Freq: Three times a day (TID) | ORAL | Status: DC | PRN

## 2024-04-15 MED ORDER — THROMBIN 5000 UNITS EX KIT
PACK | CUTANEOUS | Status: AC
  Filled 2024-04-15: qty 1

## 2024-04-15 MED ORDER — PROPOFOL 10 MG/ML IV BOLUS
INTRAVENOUS | Status: AC
  Filled 2024-04-15: qty 20

## 2024-04-15 MED ORDER — METHOCARBAMOL 500 MG PO TABS
500.0000 mg | ORAL_TABLET | Freq: Four times a day (QID) | ORAL | Status: DC | PRN
  Administered 2024-04-15 (×2): 500 mg via ORAL
  Filled 2024-04-15 (×3): qty 1

## 2024-04-15 MED ORDER — LEVOTHYROXINE SODIUM 112 MCG PO TABS
112.0000 ug | ORAL_TABLET | Freq: Every day | ORAL | Status: DC
  Administered 2024-04-16: 112 ug via ORAL
  Filled 2024-04-15: qty 1

## 2024-04-15 MED ORDER — OXYCODONE HCL 5 MG PO TABS
ORAL_TABLET | ORAL | Status: AC
  Filled 2024-04-15: qty 1

## 2024-04-15 MED ORDER — MENTHOL 3 MG MT LOZG: 1.0000 | LOZENGE | OROMUCOSAL | Status: DC | PRN

## 2024-04-15 MED ORDER — FENTANYL CITRATE (PF) 100 MCG/2ML IJ SOLN
25.0000 ug | INTRAMUSCULAR | Status: DC | PRN
  Administered 2024-04-15 (×3): 50 ug via INTRAVENOUS

## 2024-04-15 MED ORDER — ONDANSETRON HCL 4 MG/2ML IJ SOLN: 4.0000 mg | Freq: Four times a day (QID) | INTRAMUSCULAR | Status: DC | PRN

## 2024-04-15 MED ORDER — PHENYLEPHRINE 80 MCG/ML (10ML) SYRINGE FOR IV PUSH (FOR BLOOD PRESSURE SUPPORT)
PREFILLED_SYRINGE | INTRAVENOUS | Status: DC | PRN
  Administered 2024-04-15: 10:00:00 80 ug via INTRAVENOUS

## 2024-04-15 MED ORDER — CHLORHEXIDINE GLUCONATE 0.12 % MT SOLN
15.0000 mL | Freq: Once | OROMUCOSAL | Status: AC
  Administered 2024-04-15: 07:00:00 15 mL via OROMUCOSAL
  Filled 2024-04-15: qty 15

## 2024-04-15 MED ORDER — VENLAFAXINE HCL ER 75 MG PO CP24
150.0000 mg | ORAL_CAPSULE | Freq: Every day | ORAL | Status: DC
  Administered 2024-04-16: 150 mg via ORAL
  Filled 2024-04-15 (×2): qty 2

## 2024-04-15 MED ORDER — SODIUM CHLORIDE 0.9% FLUSH
3.0000 mL | Freq: Two times a day (BID) | INTRAVENOUS | Status: DC
  Administered 2024-04-15: 21:00:00 3 mL via INTRAVENOUS

## 2024-04-15 MED ORDER — PHENYLEPHRINE HCL-NACL 20-0.9 MG/250ML-% IV SOLN
INTRAVENOUS | Status: DC | PRN
  Administered 2024-04-15: 10:00:00 20 ug/min via INTRAVENOUS

## 2024-04-15 MED ORDER — BISACODYL 10 MG RE SUPP: 10.0000 mg | Freq: Every day | RECTAL | Status: DC | PRN

## 2024-04-15 MED ORDER — LORAZEPAM 0.5 MG PO TABS
1.0000 mg | ORAL_TABLET | Freq: Three times a day (TID) | ORAL | Status: DC | PRN
  Administered 2024-04-15: 21:00:00 1 mg via ORAL
  Filled 2024-04-15: qty 2

## 2024-04-15 MED ORDER — PANTOPRAZOLE SODIUM 40 MG PO TBEC
80.0000 mg | DELAYED_RELEASE_TABLET | Freq: Every day | ORAL | Status: DC
  Administered 2024-04-15 – 2024-04-16 (×2): 80 mg via ORAL
  Filled 2024-04-15 (×2): qty 2

## 2024-04-15 MED ORDER — BUPIVACAINE HCL (PF) 0.5 % IJ SOLN
INTRAMUSCULAR | Status: DC | PRN
  Administered 2024-04-15: 09:00:00 5 mL

## 2024-04-15 MED ORDER — LIDOCAINE-EPINEPHRINE 1 %-1:100000 IJ SOLN
INTRAMUSCULAR | Status: AC
  Filled 2024-04-15: qty 1

## 2024-04-15 MED ORDER — THROMBIN 5000 UNITS EX SOLR
OROMUCOSAL | Status: DC | PRN
  Administered 2024-04-15: 08:00:00 5 mL via TOPICAL

## 2024-04-15 MED ORDER — ACETAMINOPHEN 650 MG RE SUPP: 650.0000 mg | RECTAL | Status: DC | PRN

## 2024-04-15 MED ORDER — DROPERIDOL 2.5 MG/ML IJ SOLN
0.6250 mg | Freq: Once | INTRAMUSCULAR | Status: AC | PRN
  Administered 2024-04-15: 15:00:00 0.625 mg via INTRAVENOUS

## 2024-04-15 MED ORDER — FENTANYL CITRATE (PF) 100 MCG/2ML IJ SOLN
INTRAMUSCULAR | Status: AC
  Filled 2024-04-15: qty 2

## 2024-04-15 MED ORDER — KETOROLAC TROMETHAMINE 15 MG/ML IJ SOLN
INTRAMUSCULAR | Status: AC
  Filled 2024-04-15: qty 1

## 2024-04-15 MED ORDER — SENNA 8.6 MG PO TABS
1.0000 | ORAL_TABLET | Freq: Two times a day (BID) | ORAL | Status: DC
  Administered 2024-04-15: 21:00:00 8.6 mg via ORAL
  Filled 2024-04-15: qty 1

## 2024-04-15 MED ORDER — FENTANYL CITRATE (PF) 250 MCG/5ML IJ SOLN
INTRAMUSCULAR | Status: DC | PRN
  Administered 2024-04-15 (×4): 50 ug via INTRAVENOUS
  Administered 2024-04-15: 09:00:00 100 ug via INTRAVENOUS
  Administered 2024-04-15: 10:00:00 50 ug via INTRAVENOUS

## 2024-04-15 MED ORDER — LACTATED RINGERS IV SOLN: INTRAVENOUS | Status: DC

## 2024-04-15 MED ORDER — CELECOXIB 200 MG PO CAPS
200.0000 mg | ORAL_CAPSULE | Freq: Once | ORAL | Status: AC
  Administered 2024-04-15: 07:00:00 200 mg via ORAL
  Filled 2024-04-15: qty 1

## 2024-04-15 MED ORDER — KETOROLAC TROMETHAMINE 15 MG/ML IJ SOLN
15.0000 mg | Freq: Once | INTRAMUSCULAR | Status: AC
  Administered 2024-04-15: 14:00:00 15 mg via INTRAVENOUS

## 2024-04-15 MED ORDER — EZETIMIBE 10 MG PO TABS
10.0000 mg | ORAL_TABLET | Freq: Every day | ORAL | Status: DC
  Administered 2024-04-15: 18:00:00 10 mg via ORAL
  Filled 2024-04-15: qty 1

## 2024-04-15 MED ORDER — LIDOCAINE 2% (20 MG/ML) 5 ML SYRINGE
INTRAMUSCULAR | Status: DC | PRN
  Administered 2024-04-15: 08:00:00 60 mg via INTRAVENOUS

## 2024-04-15 MED ORDER — MIDAZOLAM HCL 2 MG/2ML IJ SOLN
INTRAMUSCULAR | Status: AC
  Filled 2024-04-15: qty 2

## 2024-04-15 MED ORDER — BUPIVACAINE HCL (PF) 0.5 % IJ SOLN
INTRAMUSCULAR | Status: AC
  Filled 2024-04-15: qty 30

## 2024-04-15 MED ORDER — DEXAMETHASONE SOD PHOSPHATE PF 10 MG/ML IJ SOLN
INTRAMUSCULAR | Status: DC | PRN
  Administered 2024-04-15: 09:00:00 10 mg via INTRAVENOUS

## 2024-04-15 MED ORDER — ONDANSETRON HCL 4 MG/2ML IJ SOLN
INTRAMUSCULAR | Status: DC | PRN
  Administered 2024-04-15: 13:00:00 4 mg via INTRAVENOUS

## 2024-04-15 MED ORDER — FUROSEMIDE 40 MG PO TABS: 40.0000 mg | ORAL_TABLET | Freq: Every day | ORAL | Status: DC | PRN

## 2024-04-15 MED ORDER — SACUBITRIL-VALSARTAN 24-26 MG PO TABS
1.0000 | ORAL_TABLET | Freq: Two times a day (BID) | ORAL | Status: DC
  Administered 2024-04-15 – 2024-04-16 (×2): 1 via ORAL
  Filled 2024-04-15 (×2): qty 1

## 2024-04-15 MED ORDER — INSULIN ASPART 100 UNIT/ML IJ SOLN
0.0000 [IU] | INTRAMUSCULAR | Status: DC | PRN
  Filled 2024-04-15: qty 0.14

## 2024-04-15 MED ORDER — ORAL CARE MOUTH RINSE: 15.0000 mL | Freq: Once | OROMUCOSAL | Status: AC

## 2024-04-15 MED ORDER — MIDAZOLAM HCL (PF) 2 MG/2ML IJ SOLN
INTRAMUSCULAR | Status: DC | PRN
  Administered 2024-04-15: 08:00:00 2 mg via INTRAVENOUS

## 2024-04-15 MED ORDER — ONDANSETRON HCL 4 MG PO TABS
4.0000 mg | ORAL_TABLET | Freq: Four times a day (QID) | ORAL | Status: DC | PRN
  Administered 2024-04-16: 4 mg via ORAL
  Filled 2024-04-15: qty 1

## 2024-04-15 MED ORDER — ALUM & MAG HYDROXIDE-SIMETH 200-200-20 MG/5ML PO SUSP: 30.0000 mL | Freq: Four times a day (QID) | ORAL | Status: DC | PRN

## 2024-04-15 MED ORDER — FLEET ENEMA RE ENEM: 1.0000 | ENEMA | Freq: Once | RECTAL | Status: DC | PRN

## 2024-04-15 MED ORDER — DOCUSATE SODIUM 100 MG PO CAPS
100.0000 mg | ORAL_CAPSULE | Freq: Two times a day (BID) | ORAL | Status: DC
  Administered 2024-04-15: 21:00:00 100 mg via ORAL
  Filled 2024-04-15: qty 1

## 2024-04-15 MED ORDER — ACETAMINOPHEN 325 MG PO TABS: 650.0000 mg | ORAL_TABLET | ORAL | Status: DC | PRN

## 2024-04-15 MED ORDER — ROSUVASTATIN CALCIUM 20 MG PO TABS
20.0000 mg | ORAL_TABLET | Freq: Every day | ORAL | Status: DC
  Administered 2024-04-15: 18:00:00 20 mg via ORAL
  Filled 2024-04-15: qty 1

## 2024-04-15 MED ORDER — ROCURONIUM BROMIDE 10 MG/ML (PF) SYRINGE
PREFILLED_SYRINGE | INTRAVENOUS | Status: DC | PRN
  Administered 2024-04-15: 10:00:00 10 mg via INTRAVENOUS
  Administered 2024-04-15: 08:00:00 60 mg via INTRAVENOUS
  Administered 2024-04-15: 12:00:00 20 mg via INTRAVENOUS

## 2024-04-15 MED FILL — Lidocaine HCl Local Soln Prefilled Syringe 100 MG/5ML (2%): INTRAMUSCULAR | Qty: 5 | Status: AC

## 2024-04-15 MED FILL — Ondansetron HCl Inj 4 MG/2ML (2 MG/ML): INTRAMUSCULAR | Qty: 2 | Status: AC

## 2024-04-15 MED FILL — Sugammadex Sodium IV 200 MG/2ML (Base Equivalent): INTRAVENOUS | Qty: 2 | Status: AC

## 2024-04-15 MED FILL — Ephedrine Sulf-NaCl PF Pref Syr 50 MG/10ML-0.9% (5 MG/ML): INTRAVENOUS | Qty: 5 | Status: AC

## 2024-04-15 MED FILL — henylephrine-NaCl Pref Syr 0.8 MG/10ML-0.9% (80 MCG/ML): INTRAVENOUS | Qty: 10 | Status: AC

## 2024-04-15 MED FILL — Rocuronium Bromide IV Soln Pref Syr 100 MG/10ML (10 MG/ML): INTRAVENOUS | Qty: 10 | Status: AC

## 2024-04-15 SURGICAL SUPPLY — 62 items
BAG COUNTER SPONGE SURGICOUNT (BAG) ×1 IMPLANT
BASKET BONE COLLECTION (BASKET) ×1 IMPLANT
BLADE BONE MILL MEDIUM (MISCELLANEOUS) ×1 IMPLANT
BLADE CLIPPER SURG (BLADE) IMPLANT
BRUSH SCRUB EZ PLAIN DRY (MISCELLANEOUS) IMPLANT
BUR MATCHSTICK NEURO 3.0 LAGG (BURR) ×1 IMPLANT
CAGE COROENT LG 10X9X23-12 (Cage) IMPLANT
CAGE PLIF 8X9X23-12 LUMBAR (Cage) IMPLANT
CANISTER SUCTION 3000ML PPV (SUCTIONS) ×1 IMPLANT
CNTNR URN SCR LID CUP LEK RST (MISCELLANEOUS) ×1 IMPLANT
COVER BACK TABLE 60X90IN (DRAPES) ×1 IMPLANT
DERMABOND ADVANCED .7 DNX12 (GAUZE/BANDAGES/DRESSINGS) ×1 IMPLANT
DEVICE DISSECT PLASMABLAD 3.0S (MISCELLANEOUS) ×1 IMPLANT
DRAPE C-ARM 42X72 X-RAY (DRAPES) ×2 IMPLANT
DRAPE HALF SHEET 40X57 (DRAPES) IMPLANT
DRAPE LAPAROTOMY 100X72X124 (DRAPES) ×1 IMPLANT
DRSG OPSITE POSTOP 4X8 (GAUZE/BANDAGES/DRESSINGS) IMPLANT
DURAPREP 26ML APPLICATOR (WOUND CARE) ×1 IMPLANT
DURASEAL APPLICATOR TIP (TIP) IMPLANT
DURASEAL SPINE SEALANT 3ML (MISCELLANEOUS) IMPLANT
ELECTRODE REM PT RTRN 9FT ADLT (ELECTROSURGICAL) ×1 IMPLANT
GAUZE 4X4 16PLY ~~LOC~~+RFID DBL (SPONGE) IMPLANT
GAUZE SPONGE 4X4 12PLY STRL (GAUZE/BANDAGES/DRESSINGS) ×1 IMPLANT
GLOVE BIOGEL PI IND STRL 8.5 (GLOVE) ×2 IMPLANT
GLOVE BIOGEL PI MICRO 8.5 (GLOVE) ×2 IMPLANT
GOWN STRL REUS W/ TWL LRG LVL3 (GOWN DISPOSABLE) IMPLANT
GOWN STRL REUS W/ TWL XL LVL3 (GOWN DISPOSABLE) IMPLANT
GOWN STRL REUS W/TWL 2XL LVL3 (GOWN DISPOSABLE) ×2 IMPLANT
GOWN STRL SURGICAL XL XLNG (GOWN DISPOSABLE) IMPLANT
GRAFT BNE CANC CHIPS 1-8 20CC (Bone Implant) IMPLANT
GRAFT BONE PROTEIOS LRG 5CC (Orthopedic Implant) IMPLANT
HEMOSTAT POWDER KIT SURGIFOAM (HEMOSTASIS) ×1 IMPLANT
KIT BASIN OR (CUSTOM PROCEDURE TRAY) ×1 IMPLANT
KIT GRAFTMAG DEL NEURO DISP (NEUROSURGERY SUPPLIES) IMPLANT
KIT TURNOVER KIT B (KITS) ×1 IMPLANT
MILL BONE PREP (MISCELLANEOUS) ×1 IMPLANT
NDL HYPO 22X1.5 SAFETY MO (MISCELLANEOUS) ×1 IMPLANT
NDL SPNL 18GX3.5 QUINCKE PK (NEEDLE) IMPLANT
NEEDLE HYPO 22X1.5 SAFETY MO (MISCELLANEOUS) ×1 IMPLANT
NEEDLE SPNL 18GX3.5 QUINCKE PK (NEEDLE) IMPLANT
PACK LAMINECTOMY NEURO (CUSTOM PROCEDURE TRAY) ×1 IMPLANT
PAD ARMBOARD POSITIONER FOAM (MISCELLANEOUS) ×3 IMPLANT
PATTIES SURGICAL .5 X1 (DISPOSABLE) ×1 IMPLANT
ROD RELINE-O LORDOTIC 5.5X60MM (Rod) IMPLANT
SCREW LOCK RELINE 5.5 TULIP (Screw) IMPLANT
SCREW RELINE 2FS POLY 6.5X45 (Screw) IMPLANT
SOLN 0.9% NACL POUR BTL 1000ML (IV SOLUTION) ×1 IMPLANT
SOLN STERILE WATER BTL 1000 ML (IV SOLUTION) ×1 IMPLANT
SPIKE FLUID TRANSFER (MISCELLANEOUS) ×1 IMPLANT
SPONGE SURGIFOAM ABS GEL 100 (HEMOSTASIS) IMPLANT
SPONGE T-LAP 4X18 ~~LOC~~+RFID (SPONGE) IMPLANT
SUT PROLENE 6 0 BV (SUTURE) IMPLANT
SUT VIC AB 1 CT1 18XBRD ANBCTR (SUTURE) ×1 IMPLANT
SUT VIC AB 2-0 CP2 18 (SUTURE) ×1 IMPLANT
SUT VIC AB 3-0 SH 8-18 (SUTURE) ×1 IMPLANT
SUT VIC AB 4-0 RB1 18 (SUTURE) ×1 IMPLANT
SYR 3ML LL SCALE MARK (SYRINGE) ×4 IMPLANT
SYR 5ML LL (SYRINGE) IMPLANT
TOWEL GREEN STERILE (TOWEL DISPOSABLE) ×1 IMPLANT
TOWEL GREEN STERILE FF (TOWEL DISPOSABLE) ×1 IMPLANT
TRAY FOLEY MTR SLVR 14FR STAT (SET/KITS/TRAYS/PACK) IMPLANT
TRAY FOLEY MTR SLVR 16FR STAT (SET/KITS/TRAYS/PACK) ×1 IMPLANT

## 2024-04-15 NOTE — Transfer of Care (Signed)
 Immediate Anesthesia Transfer of Care Note  Patient: Rebecca [name]  Procedure(s) Performed: POSTERIOR LUMBAR INTERBODY FUSION LUMBAR FOUR-FIVE LUMBAR FIVE-SACRAL ONE (Back)  Patient Location: PACU  Anesthesia Type:General  Level of Consciousness: awake, alert , and oriented  Airway & Oxygen Therapy: Patient Spontanous Breathing  Post-op Assessment: Report given to RN and Post -op Vital signs reviewed and stable  Post vital signs: Reviewed and stable  Last Vitals:  Vitals Value Taken Time  BP 93/66 04/15/24 13:25  Temp    Pulse 120 04/15/24 13:31  Resp 21 04/15/24 13:31  SpO2 93 % 04/15/24 13:31  Vitals shown include unfiled device data.  Last Pain:  Vitals:   04/15/24 0643  TempSrc:   PainSc: 8       Patients Stated Pain Goal: 5 (04/15/24 9356)  Complications: No notable events documented.

## 2024-04-15 NOTE — Progress Notes (Signed)
 Orthopedic Tech Progress Note Patient Details:  Rebecca [name] [facility] 1971/03/16 [id number]  Ortho Devices Type of Ortho Device: Lumbar corsett Ortho Device/Splint Location: Back Ortho Device/Splint Interventions: Adjustment, Ordered   Post Interventions Patient Tolerated: Well  Mekaila Tarnow A Emrys Mceachron 04/15/2024, 3:48 PM

## 2024-04-15 NOTE — Op Note (Signed)
 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  Posterior lumbar interbody arthrodesis with local autograft allograft and Proteoss.  Posterolateral arthrodesis with local autograft allograft and Proteoss.  L4 to sacrum Surgeon: Victory Gens First Assistant: Dorn Glade, MD Anesthesia: General Tracheal Indications: Rebecca [name] is a 53 year old individual who has had significant spondylolisthesis at the level of L4-L5 with a disc herniation recurring twice at the L5-S1 level.  She is advised regarding the need for surgical decompression and stabilization to include L4-5 and L5-S1.  Procedure: She was brought to the operating room supine on the stretcher.  After the smooth induction of general endotracheal anesthesia she was carefully turned prone.  Back was prepped with alcohol DuraPrep and draped in a sterile fashion.  The previously incision was excised and dissection was carried down to the lumbodorsal fascia which was opened on either side of midline to expose the spinous process of L5 and L4 down to the sacrum.  Epidural fibrosis from the left side at the L5-S1 level was released.  Then after taking some localizing radiographs we created and completed laminotomies including a facetectomy at the L4-5 level and at the L5-S1 levels the disc space was exposed and isolated.  The nerve roots of L4 were individually dissected and decompressed on the left and the right and similarly at L5-S1.  On the left side where there was substantial epidural fibrosis care was taken through dissect the fibrosis from the dura and isolate the path of the S1 nerve root inferiorly.  The dura was mobilized off the posterior portion of the ligament over the disc  herniation at the L5-S1 level.  The disc was then incised and a combination of curettes and rongeurs was used to create complete discectomies at the L4-5 and the L5-S1 levels.  This was done with a combination of curettes and rongeur's.  This was also done with the help of Dr. Glade to worked contralateral by side to the lateral portions of the dissection on my side while I worked on the medial portions and vice versa.  Once the discectomies were completed and the interspaces were freed the interspace could be adjusted to a proper position to reduce the spondylolisthesis.  It was felt that an 8 x 9 x 23 mm spacer with 12 degrees lordosis would fit best into the L5-S1 level and 2 of these were placed at the L4-L5 level 10 x 9 x 23 mm spacers with 12 degrees lordosis were used.  15 cc of bone graft was packed into the disc space at L4-L5 12 cc of bone graft was packed into the disc base at the L5-S1 level.  Then the lateral gutters which had been decorticated were packed with an additional 9 cc of bone graft into each lateral gutter.  Pedicle entry sites were chosen at L4-L5 and the sacrum using fluoroscopic guidance pedicle entry sites were chosen and 6.5 x 45 mm screws were placed in L4-L5 and the sacrum bicortical purchase was obtained at the sacrum.  With the placement of the screws being completed a 60 mm precontoured rod was used to connect the screws from L4 to sacrum.  This was done in a neutral construct.  Once verified radiographically the screws were torqued into their final position some additional bone was packed into the lateral gutters and the  intertransverse space at L4-5 and L5-S1 hemostasis was obtained carefully and the soft tissues as best possible the pads of the L4 the L5 and the S1 nerve roots were checked to make sure they are adequately decompressed and the debris had lodged near them and when verified the dorsal fascia was closed with #1 Vicryl in interrupted fashion 2-0 Vicryl was used in  the subcutaneous tissues 3-0 and 4-0 Vicryl was used in the subcuticular skin Dermabond was placed on the skin blood loss is estimated 600 cc a total of 250 cc of Cell Saver blood was returned to the patient.

## 2024-04-15 NOTE — H&P (Signed)
 Rebecca [name] is an 53 y.o. female.   Chief Complaint: Back and bilateral leg pain HPI: Patient is a 53 year old individual who has had significant bouts of lumbar radiculopathy she has a spondylolisthesis at the level of L4-L5 on 2 occasions she has herniated nucleus pulposus at the level of L5-S1 eccentric to the left side.  Each time after the discectomy she improved however she is Chronic back pain she has a mobile spondylolisthesis of about 4 mm at L4-L5 that we have been following him has been getting progressively worse with the formation of small synovial cysts on the inside of the facet joints having failed efforts at maximal conservative therapy have advised a decompression and fusion of L4-5 and L5-S1 she is now admitted for this procedure.  Past Medical History:  Diagnosis Date   Acute respiratory disease due to COVID-19 virus 12/25/2019   AICD (automatic cardioverter/defibrillator) present    Allergy    constant hives post chemo therapy.  All allergen tests have been neg.   Anxiety    Arthritis    Breast cancer (HCC)    Coronary artery disease    CRTD Medtronic COBALT XT CRT D 04/05/2022 04/05/2022   Diabetes mellitus without complication (HCC)    Diverticulitis    Encounter for assessment of implantable cardioverter-defibrillator (ICD) 04/06/2022   GERD (gastroesophageal reflux disease)    Headache    Heart failure (HCC) 01/02/2022   EF 25 to 30%   Hiatal hernia    Hyperlipidemia    Hypertension    Hypothyroidism    LBBB (left bundle branch block)    Pneumonia     Past Surgical History:  Procedure Laterality Date   BIV ICD INSERTION CRT-D N/A 04/05/2022   Procedure: BIV ICD INSERTION CRT-D;  Surgeon: Waddell Danelle ORN, MD;  Location: [facility] INVASIVE CV LAB;  Service: Cardiovascular;  Laterality: N/A;   BREAST RECONSTRUCTION WITH PLACEMENT OF TISSUE EXPANDER AND ALLODERM Bilateral 11/18/2017   Procedure: BILATERAL BREAST RECONSTRUCTION WITH PLACEMENT OF TISSUE EXPANDER  AND ALLODERM;  Surgeon: Arelia Filippo, MD;  Location: [facility];  Service: Plastics;  Laterality: Bilateral;   CESAREAN SECTION  2007/2010   x2   COLONOSCOPY  2017   COLONOSCOPY WITH PROPOFOL  N/A 03/19/2022   Procedure: COLONOSCOPY WITH PROPOFOL ;  Surgeon: Albertus Gordy HERO, MD;  Location: [facility];  Service: Gastroenterology;  Laterality: N/A;   LAPAROSCOPIC TUBAL LIGATION  06/12/2011   Procedure: LAPAROSCOPIC TUBAL LIGATION;  Surgeon: Rosaline DELENA Luna, MD;  Location: [facility];  Service: Gynecology;  Laterality: N/A;  filshie clip   LIPOSUCTION WITH LIPOFILLING Bilateral 02/24/2018   Procedure: LIPOFILLING FROM ABDOMEN TO BILATERAL CHEST;  Surgeon: Arelia Filippo, MD;  Location: [facility];  Service: Plastics;  Laterality: Bilateral;   LUMBAR LAMINECTOMY/ DECOMPRESSION WITH MET-RX Left 02/08/2022   Procedure: Left Lumbar Five-Sacral One Microdiscectomy with Metrex;  Surgeon: Colon Shove, MD;  Location: [facility];  Service: Neurosurgery;  Laterality: Left;  3C/RM 20   LUMBAR LAMINECTOMY/ DECOMPRESSION WITH MET-RX Left 05/31/2022   Procedure: Left Lumbar Five-Sacral One Microdiscectomy;  Surgeon: Colon Shove, MD;  Location: [facility];  Service: Neurosurgery;  Laterality: Left;  Patient being admitted after having myelogram/ct this morning   MASTECTOMY W/ SENTINEL NODE BIOPSY Bilateral 11/18/2017   Procedure: BILATERAL TOTAL MASTECTOMIES WITH RIGHT SENTINEL LYMPH NODE BIOPSY;  Surgeon: Ebbie Cough, MD;  Location: [facility];  Service: General;  Laterality: Bilateral;   mini tuck  2012   abdominal  PORT-A-CATH REMOVAL Right 01/05/2019   PORTACATH PLACEMENT Right 07/15/2017   Procedure: INSERTION PORT-A-CATH WITH ULTRASOUND;  Surgeon: Ebbie Cough, MD;  Location: [facility];  Service: General;  Laterality: Right;   REMOVAL OF BILATERAL TISSUE EXPANDERS WITH PLACEMENT OF BILATERAL BREAST IMPLANTS Bilateral 02/24/2018    Procedure: REMOVAL OF BILATERAL TISSUE EXPANDERS WITH PLACEMENT OF BILATERAL BREAST IMPLANTS;  Surgeon: Arelia Filippo, MD;  Location: [facility];  Service: Plastics;  Laterality: Bilateral;   right breast cancer     upper-outer quadrant    right ear surg  03/2008   Mohs   TUBAL LIGATION     US  ECHOCARDIOGRAPHY  12-30-2007   EF 55-60%   WISDOM TOOTH EXTRACTION      Family History  Problem Relation Age of Onset   Lymphoma Father    Cancer Father        lymphoma   Diabetes Brother    Diabetes Mother    Liver disease Mother        NASH, d. 68   Asthma Mother    CAD Maternal Grandmother    CAD Maternal Grandfather    Stomach cancer Neg Hx    Colon cancer Neg Hx    Colon polyps Neg Hx    Esophageal cancer Neg Hx    Rectal cancer Neg Hx    Social History:  reports that she quit smoking about 19 years ago. Her smoking use included cigarettes. She started smoking about 25 years ago. She has a 1.5 pack-year smoking history. She has never used smokeless tobacco. She reports current alcohol use. She reports that she does not use drugs.  Allergies: Allergies[1]  Medications Prior to Admission  Medication Sig Dispense Refill   EPINEPHrine  0.3 mg/0.3 mL IJ SOAJ injection Use as directed for life threatening allergic reactions only 2 each 3   esomeprazole  (NEXIUM ) 40 MG capsule TAKE 1 CAPSULE (40 MG TOTAL) BY MOUTH DAILY AT 12 NOON. 90 capsule 3   ezetimibe  (ZETIA ) 10 MG tablet Take 1 tablet (10 mg total) by mouth daily after supper. 90 tablet 1   furosemide  (LASIX ) 40 MG tablet TAKE 1 TABLET BY MOUTH EVERY DAY AS NEEDED FOR FLUID *SHORTNESS OF BREATH* 30 tablet 1   ibuprofen (MOTRIN IB) 200 MG tablet Take 400 mg by mouth every 6 (six) hours as needed for mild pain or moderate pain.     levothyroxine  (SYNTHROID ) 112 MCG tablet Take 1 tablet (112 mcg total) by mouth daily. 90 tablet 3   LORazepam  (ATIVAN ) 1 MG tablet TAKE ONE TABLET BY MOUTH THREE TIMES DAILY FOR ANXIETY  (Patient taking differently: Take 1 mg by mouth every 8 (eight) hours as needed for anxiety.) 90 tablet 1   meclizine  (ANTIVERT ) 12.5 MG tablet Take 1 tablet (12.5 mg total) by mouth 3 (three) times daily as needed for dizziness. 30 tablet 3   metoprolol  succinate (TOPROL -XL) 25 MG 24 hr tablet Take 1 tablet (25 mg total) by mouth daily. Take with or immediately following a meal. 90 tablet 3   montelukast  (SINGULAIR ) 10 MG tablet TAKE ONE TABLET BY MOUTH DAILY 90 tablet 0   rosuvastatin  (CRESTOR ) 20 MG tablet Take 1 tablet (20 mg total) by mouth daily after supper. 90 tablet 1   sacubitril -valsartan  (ENTRESTO ) 24-26 MG Take 1 tablet by mouth 2 (two) times daily. 180 tablet 1   triamcinolone cream (KENALOG) 0.1 % Apply 1 Application topically 2 (two) times daily as needed (rash).     venlafaxine   XR (EFFEXOR -XR) 150 MG 24 hr capsule Take 1 capsule (150 mg total) by mouth daily with breakfast. 90 capsule 1   testosterone cypionate (DEPO-TESTOSTERONE) 200 MG/ML injection Inject 200 mg into the muscle every 3 (three) months.     tirzepatide  (MOUNJARO ) 15 MG/0.5ML Pen Inject 15 mg into the skin once a week. (Patient not taking: Reported on 04/08/2024) 2 mL 6    Results for orders placed or performed during the hospital encounter of 04/15/24 (from the past 48 hours)  Glucose, capillary     Status: Abnormal   Collection Time: 04/15/24  6:27 AM  Result Value Ref Range   Glucose-Capillary 104 (H) 70 - 99 mg/dL    Comment: Glucose reference range applies only to samples taken after fasting for at least 8 hours.   *Note: Due to a large number of results and/or encounters for the requested time period, some results have not been displayed. A complete set of results can be found in Results Review.   No results found.  Review of Systems  Constitutional:  Positive for activity change.  Musculoskeletal:  Positive for back pain and gait problem.  Neurological:  Positive for weakness and numbness.    Blood  pressure 94/69, pulse 76, temperature 97.9 F (36.6 C), temperature source Oral, resp. rate 20, height 5' 9 (1.753 m), weight 83 kg, SpO2 98%. Physical Exam Constitutional:      Appearance: Normal appearance. She is normal weight.  HENT:     Head: Normocephalic and atraumatic.     Right Ear: Tympanic membrane, ear canal and external ear normal.     Left Ear: Tympanic membrane, ear canal and external ear normal.     Nose: Nose normal.     Mouth/Throat:     Mouth: Mucous membranes are moist.     Pharynx: Oropharynx is clear.  Eyes:     Extraocular Movements: Extraocular movements intact.     Conjunctiva/sclera: Conjunctivae normal.     Pupils: Pupils are equal, round, and reactive to light.  Cardiovascular:     Rate and Rhythm: Normal rate and regular rhythm.     Pulses: Normal pulses.     Heart sounds: Normal heart sounds.  Pulmonary:     Effort: Pulmonary effort is normal.     Breath sounds: Normal breath sounds.  Abdominal:     General: Abdomen is flat. Bowel sounds are normal.     Palpations: Abdomen is soft.  Musculoskeletal:     Cervical back: Normal range of motion and neck supple.     Comments: Straight leg raising at 30 degrees in either lower extremity Patrick's maneuver is negative bilaterally  Skin:    General: Skin is warm and dry.     Capillary Refill: Capillary refill takes less than 2 seconds.  Neurological:     Mental Status: She is alert.     Comments: Weakness in tibialis anterior on the left 4 out of 5 gastroc strength is 5 out of 5 absent reflex on the left side in the patella and the Achilles both.  Psychiatric:        Mood and Affect: Mood normal.        Behavior: Behavior normal.        Thought Content: Thought content normal.        Judgment: Judgment normal.      Assessment/Plan Spondylolisthesis L4-5 recurrent herniated nucleus pulposus L5-S1 with chronic lumbar radiculopathy Plan: Posterior decompression L4-5 L5-S1 posterior lumbar interbody  arthrodesis with pedicle  screw fixation L4 to sacrum.  Victory JINNY Gens, MD 04/15/2024, 7:34 AM       [1] No Known Allergies

## 2024-04-15 NOTE — Anesthesia Procedure Notes (Signed)
 Procedure Name: Intubation Date/Time: 04/15/2024 8:16 AM  Performed by: Hedy Jarred, CRNAPre-anesthesia Checklist: Patient identified, Emergency Drugs available, Suction available and Patient being monitored Patient Re-evaluated:Patient Re-evaluated prior to induction Oxygen Delivery Method: Circle System Utilized Preoxygenation: Pre-oxygenation with 100% oxygen Induction Type: IV induction Ventilation: Mask ventilation without difficulty Laryngoscope Size: Mac and 3 Grade View: Grade I Tube type: Oral Tube size: 7.0 mm Number of attempts: 1 Airway Equipment and Method: Stylet and Oral airway Placement Confirmation: ETT inserted through vocal cords under direct vision, positive ETCO2 and breath sounds checked- equal and bilateral Secured at: 22 cm Tube secured with: Tape Dental Injury: Teeth and Oropharynx as per pre-operative assessment

## 2024-04-16 ENCOUNTER — Encounter: Payer: Self-pay | Admitting: Hematology and Oncology

## 2024-04-16 DIAGNOSIS — M5117 Intervertebral disc disorders with radiculopathy, lumbosacral region: Secondary | ICD-10-CM | POA: Diagnosis not present

## 2024-04-16 LAB — GLUCOSE, CAPILLARY: Glucose-Capillary: 117 mg/dL — ABNORMAL HIGH (ref 70–99)

## 2024-04-16 LAB — CBC
HCT: 33.5 % — ABNORMAL LOW (ref 36.0–46.0)
Hemoglobin: 11 g/dL — ABNORMAL LOW (ref 12.0–15.0)
MCH: 28 pg (ref 26.0–34.0)
MCHC: 32.8 g/dL (ref 30.0–36.0)
MCV: 85.2 fL (ref 80.0–100.0)
Platelets: 174 K/uL (ref 150–400)
RBC: 3.93 MIL/uL (ref 3.87–5.11)
RDW: 13.2 % (ref 11.5–15.5)
WBC: 11 K/uL — ABNORMAL HIGH (ref 4.0–10.5)
nRBC: 0 % (ref 0.0–0.2)

## 2024-04-16 LAB — BASIC METABOLIC PANEL WITH GFR
Anion gap: 8 (ref 5–15)
BUN: 13 mg/dL (ref 6–20)
CO2: 26 mmol/L (ref 22–32)
Calcium: 8.6 mg/dL — ABNORMAL LOW (ref 8.9–10.3)
Chloride: 104 mmol/L (ref 98–111)
Creatinine, Ser: 0.74 mg/dL (ref 0.44–1.00)
GFR, Estimated: >60 mL/min
Glucose, Bld: 127 mg/dL — ABNORMAL HIGH (ref 70–99)
Potassium: 5.2 mmol/L — ABNORMAL HIGH (ref 3.5–5.1)
Sodium: 137 mmol/L (ref 135–145)

## 2024-04-16 MED ORDER — METHOCARBAMOL 500 MG PO TABS: 500.0000 mg | ORAL_TABLET | Freq: Four times a day (QID) | ORAL | 3 refills | Status: AC | PRN

## 2024-04-16 MED ORDER — SILVER SULFADIAZINE 1 % EX CREA: TOPICAL_CREAM | CUTANEOUS | 1 refills | Status: AC

## 2024-04-16 MED ORDER — OXYCODONE-ACETAMINOPHEN 5-325 MG PO TABS: 1.0000 | ORAL_TABLET | Freq: Four times a day (QID) | ORAL | 0 refills | Status: DC | PRN

## 2024-04-16 MED FILL — Heparin Sodium (Porcine) Inj 1000 Unit/ML: INTRAMUSCULAR | Qty: 30 | Status: AC

## 2024-04-16 MED FILL — Sodium Chloride IV Soln 0.9%: INTRAVENOUS | Qty: 1000 | Status: AC

## 2024-04-16 NOTE — Discharge Summary (Signed)
 Physician Discharge Summary  Patient ID: Rebecca [name] MRN: [id number] DOB/AGE: [birth year] 53 y.o.  Admit date: 04/15/2024 Discharge date: 04/16/2024  Admission Diagnoses: Lumbar spondylolisthesis with radiculopathy L4-5 L5-S1  Discharge Diagnoses: Lumbar spondylolisthesis with radiculopathy L4-5 L5-S1 Principal Problem:   Spondylolisthesis at L4-L5 level   Discharged Condition: good  Hospital Course: Patient tolerated surgery well  Consults: None  Significant Diagnostic Studies: None  Treatments: surgery: See op note  Discharge Exam: Blood pressure 102/72, pulse 74, temperature 98.6 F (37 C), temperature source Oral, resp. rate 16, height 5' 9 (1.753 m), weight 83 kg, SpO2 93%. Incisions clean and dry Station and gait are intact  Disposition: Discharge disposition: 01-Home or Self Care       Discharge Instructions     Discharge wound care:   Complete by: As directed    Okay to shower. Do not apply salves or appointments to incision. No heavy lifting with the upper extremities greater than 10 pounds. May resume driving when not requiring pain medication and patient feels comfortable with doing so.   Incentive spirometry RT   Complete by: As directed    Increase activity slowly   Complete by: As directed       Allergies as of 04/16/2024   No Known Allergies      Medication List     TAKE these medications    Depo-Testosterone 200 MG/ML injection Generic drug: testosterone cypionate Inject 200 mg into the muscle every 3 (three) months.   Entresto  24-26 MG Generic drug: sacubitril -valsartan  Take 1 tablet by mouth 2 (two) times daily.   EPINEPHrine  0.3 mg/0.3 mL Soaj injection Commonly known as: EPI-PEN Use as directed for life threatening allergic reactions only   esomeprazole  40 MG capsule Commonly known as: NEXIUM  TAKE 1 CAPSULE (40 MG TOTAL) BY MOUTH DAILY AT 12 NOON.   ezetimibe  10 MG tablet Commonly known as: ZETIA  Take 1 tablet  (10 mg total) by mouth daily after supper.   furosemide  40 MG tablet Commonly known as: LASIX  TAKE 1 TABLET BY MOUTH EVERY DAY AS NEEDED FOR FLUID *SHORTNESS OF BREATH*   levothyroxine  112 MCG tablet Commonly known as: SYNTHROID  Take 1 tablet (112 mcg total) by mouth daily.   LORazepam  1 MG tablet Commonly known as: ATIVAN  TAKE ONE TABLET BY MOUTH THREE TIMES DAILY FOR ANXIETY What changed:  how much to take how to take this when to take this reasons to take this additional instructions   meclizine  12.5 MG tablet Commonly known as: ANTIVERT  Take 1 tablet (12.5 mg total) by mouth 3 (three) times daily as needed for dizziness.   methocarbamol  500 MG tablet Commonly known as: ROBAXIN  Take 1 tablet (500 mg total) by mouth every 6 (six) hours as needed for muscle spasms.   metoprolol  succinate 25 MG 24 hr tablet Commonly known as: TOPROL -XL Take 1 tablet (25 mg total) by mouth daily. Take with or immediately following a meal.   montelukast  10 MG tablet Commonly known as: SINGULAIR  TAKE ONE TABLET BY MOUTH DAILY   Motrin IB 200 MG tablet Generic drug: ibuprofen Take 400 mg by mouth every 6 (six) hours as needed for mild pain or moderate pain.   oxyCODONE -acetaminophen  5-325 MG tablet Commonly known as: PERCOCET/ROXICET Take 1-2 tablets by mouth every 6 (six) hours as needed for moderate pain (pain score 4-6) or severe pain (pain score 7-10).   rosuvastatin  20 MG tablet Commonly known as: CRESTOR  Take 1 tablet (20 mg total) by mouth daily after supper.  silver  sulfADIAZINE  1 % cream Commonly known as: SILVADENE  Apply to affected area daily   tirzepatide  15 MG/0.5ML Pen Commonly known as: MOUNJARO  Inject 15 mg into the skin once a week.   triamcinolone cream 0.1 % Commonly known as: KENALOG Apply 1 Application topically 2 (two) times daily as needed (rash).   venlafaxine  XR 150 MG 24 hr capsule Commonly known as: EFFEXOR -XR Take 1 capsule (150 mg total) by mouth  daily with breakfast.               Discharge Care Instructions  (From admission, onward)           Start     Ordered   04/16/24 0000  Discharge wound care:       Comments: Okay to shower. Do not apply salves or appointments to incision. No heavy lifting with the upper extremities greater than 10 pounds. May resume driving when not requiring pain medication and patient feels comfortable with doing so.   04/16/24 0819             Signed: Victory PARAS Shakesha Soltau 04/16/2024, 8:19 AM

## 2024-04-16 NOTE — Progress Notes (Signed)
 PT Cancellation Note and Discharge  Patient Details Name: Rebecca [name] MRN: [id number] DOB: 25-Mar-1971   Cancelled Treatment:    Reason Eval/Treat Not Completed: PT screened, no needs identified, will sign off. Discussed pt case with OT who reports pt is currently mobilizing without assistance and does not require a formal PT evaluation at this time. PT signing off. If needs change, please reconsult.     [name] 04/16/2024, 9:34 AM  [name], PT, DPT Acute Rehabilitation Services Secure Chat Preferred Office: [phone]

## 2024-04-16 NOTE — Anesthesia Postprocedure Evaluation (Signed)
"   Anesthesia Post Note  Patient: Rebecca [name]  Procedure(s) Performed: POSTERIOR LUMBAR INTERBODY FUSION LUMBAR FOUR-FIVE LUMBAR FIVE-SACRAL ONE (Back)     Patient location during evaluation: PACU Anesthesia Type: General Level of consciousness: awake and alert Pain management: pain level controlled Vital Signs Assessment: post-procedure vital signs reviewed and stable Respiratory status: spontaneous breathing, nonlabored ventilation, respiratory function stable and patient connected to nasal cannula oxygen Cardiovascular status: blood pressure returned to baseline and stable Postop Assessment: no apparent nausea or vomiting Anesthetic complications: no   No notable events documented.  Last Vitals:  Vitals:   04/16/24 0540 04/16/24 0736  BP: (!) 91/59 102/72  Pulse: 72 74  Resp: 18 16  Temp: 37.3 C 37 C  SpO2: 100% 93%    Last Pain:  Vitals:   04/16/24 0901  TempSrc:   PainSc: 4                  Najmo Pardue P [name]      "

## 2024-04-16 NOTE — Evaluation (Addendum)
 Occupational Therapy Evaluation Patient Details Name: Rebecca [name] MRN: [id number] DOB: 08/11/1970 Today's Date: 04/16/2024   History of Present Illness   Rebecca [name] is a 53 yo female who is s/p decompression and fusion of L4-5 and L5-S1 12/18. PMH significant for spinal surgery, COVID,  heart failure, insertion of cardioverter-defibrillator, anxiety, breast cancer, CAD, GERD, HTN, LBBB, and hypothyroidism.     Clinical Impressions [name] was evaluated s/p the above spine surgery. She is indep and lives with family at baseline. Upon evaluation pt was limited by surgical pain, spinal precautions, compensatory techniques and activity tolerance. Overall she demonstrated mod I ability to complete ADLs and functional mobility with RW for comfort. Provided cues and education on spinal precautions and compensatory techniques throughout, handout provided and pt demonstrated great recall during ADLs and mobility. Pt does not require further acute OT services. Recommend d/c home with support of family.       If plan is discharge home, recommend the following:   Assist for transportation;Assistance with cooking/housework     Functional Status Assessment   Patient has had a recent decline in their functional status and demonstrates the ability to make significant improvements in function in a reasonable and predictable amount of time.     Equipment Recommendations   Other (comment) RW      Precautions/Restrictions   Precautions Precautions: Fall;Back Precaution Booklet Issued: Yes (comment) Recall of Precautions/Restrictions: Intact Required Braces or Orthoses: Spinal Brace Spinal Brace: Lumbar corset Restrictions Weight Bearing Restrictions Per Provider Order: No     Mobility Bed Mobility Overal bed mobility: Modified Independent             General bed mobility comments: log roll    Transfers Overall transfer level: Modified independent                  General transfer comment: RW fro safety and comfort      Balance Overall balance assessment: Modified Independent, No apparent balance deficits (not formally assessed)                                         ADL either performed or assessed with clinical judgement   ADL Overall ADL's : Modified independent                                       General ADL Comments: mod I after review of compensatory techniques     Vision Baseline Vision/History: 0 No visual deficits Vision Assessment?: No apparent visual deficits     Perception Perception: Within Functional Limits       Praxis Praxis: WFL       Pertinent Vitals/Pain Pain Assessment Pain Assessment: Faces Faces Pain Scale: Hurts little more Pain Location: back Pain Descriptors / Indicators: Discomfort, Grimacing, Guarding Pain Intervention(s): Limited activity within patient's tolerance, Monitored during session     Extremity/Trunk Assessment Upper Extremity Assessment Upper Extremity Assessment: Overall WFL for tasks assessed   Lower Extremity Assessment Lower Extremity Assessment: Overall WFL for tasks assessed   Cervical / Trunk Assessment Cervical / Trunk Assessment: Neck Surgery   Communication Communication Communication: No apparent difficulties   Cognition Arousal: Alert Behavior During Therapy: WFL for tasks assessed/performed Cognition: No apparent impairments  Following commands: Intact       Cueing  General Comments   Cueing Techniques: Verbal cues  VSS    Home Living Family/patient expects to be discharged to:: Private residence Living Arrangements: Spouse/significant other Available Help at Discharge: Family;Available 24 hours/day Type of Home: House Home Access: Stairs to enter [employer] of Steps: 4 Entrance Stairs-Rails: Right;Left Home Layout: Able to live on main level with  bedroom/bathroom     Bathroom Shower/Tub: Producer, Television/film/video: Standard     Home Equipment: Cane - single point          Prior Functioning/Environment Prior Level of Function : Independent/Modified Independent             Mobility Comments: no AD, indep ADLs Comments: indep    OT Problem List: Decreased activity tolerance;Decreased knowledge of precautions   OT Treatment/Interventions:        OT Goals(Current goals can be found in the care plan section)   Acute Rehab OT Goals Patient Stated Goal: home OT Goal Formulation: With patient Time For Goal Achievement: 04/16/24 Potential to Achieve Goals: Good   AM-PAC OT 6 Clicks Daily Activity     Outcome Measure Help from another person eating meals?: None Help from another person taking care of personal grooming?: None Help from another person toileting, which includes using toliet, bedpan, or urinal?: None Help from another person bathing (including washing, rinsing, drying)?: None Help from another person to put on and taking off regular upper body clothing?: None Help from another person to put on and taking off regular lower body clothing?: None 6 Click Score: 24   End of Session Equipment Utilized During Treatment: Rolling walker (2 wheels);Back brace Nurse Communication: Mobility status  Activity Tolerance: Patient tolerated treatment well Patient left: in bed  OT Visit Diagnosis: Pain                Time: 9195-9175 OT Time Calculation (min): 20 min Charges:  OT General Charges $OT Visit: 1 Visit OT Evaluation $OT Eval Moderate Complexity: 1 Mod  Lucie Kendall, OTR/L Acute Rehabilitation Services Office [phone] Secure Chat Communication Preferred   Lucie JONETTA Kendall 04/16/2024, 9:44 AM

## 2024-04-16 NOTE — Progress Notes (Signed)

## 2024-04-16 NOTE — Plan of Care (Signed)

## 2024-04-16 NOTE — Discharge Instructions (Signed)
 Wound Care Leave incision open to air. You may shower. Do not scrub directly on incision.  Do not put any creams, lotions, or ointments on incision. Activity Walk each and every day, increasing distance each day. No lifting greater than 8 lbs.  Avoid bending, arching, and twisting. No driving for 2 weeks; may ride as a passenger locally. If provided with back brace, wear when out of bed.  It is not necessary to wear in bed. Diet Resume your normal diet.  Return to Work Will be discussed at you follow up appointment. Call Your Doctor If Any of These Occur Redness, drainage, or swelling at the wound.  Temperature greater than 101 degrees. Severe pain not relieved by pain medication. Incision starts to come apart. Follow Up Appt Call today for appointment in 3-4 weeks ([phone]) or for problems.  If you have any hardware placed in your spine, you will need an x-ray before your appointment.

## 2024-04-28 ENCOUNTER — Other Ambulatory Visit: Payer: Self-pay | Admitting: Internal Medicine

## 2024-05-20 ENCOUNTER — Other Ambulatory Visit: Payer: Self-pay | Admitting: Internal Medicine

## 2024-05-20 NOTE — Telephone Encounter (Signed)
 Patient is requesting a refill on Lorazepam  last refill 02/23/24 #90 + 1 refill Last OV 02/13/2024

## 2024-05-24 ENCOUNTER — Encounter: Payer: Self-pay | Admitting: Cardiology

## 2024-05-24 DIAGNOSIS — I5022 Chronic systolic (congestive) heart failure: Secondary | ICD-10-CM

## 2024-05-25 ENCOUNTER — Encounter: Payer: Self-pay | Admitting: Cardiology

## 2024-05-25 ENCOUNTER — Telehealth: Payer: Self-pay | Admitting: Pharmacy Technician

## 2024-05-25 ENCOUNTER — Other Ambulatory Visit (HOSPITAL_COMMUNITY): Payer: Self-pay

## 2024-05-25 ENCOUNTER — Encounter: Payer: Self-pay | Admitting: Hematology and Oncology

## 2024-05-25 MED ORDER — SACUBITRIL-VALSARTAN 24-26 MG PO TABS: 1.0000 | ORAL_TABLET | Freq: Two times a day (BID) | ORAL | 0 refills | Status: AC

## 2024-05-25 NOTE — Telephone Encounter (Signed)
" ° °  But brand is non formulary insurance prefers generic -did pa for generic    Pharmacy Patient Advocate Encounter   Received notification from Onbase CMM KEY that prior authorization for SACUBITRIL /VALSARTAN  is required/requested.   Insurance verification completed.   The patient is insured through [facility] Secretary.   Per test claim: PA required; PA submitted to above mentioned insurance via Latent Key/confirmation #/EOC [id number] Status is pending  "

## 2024-05-26 NOTE — Telephone Encounter (Signed)
 Pharmacy Patient Advocate Encounter  Received notification from [employer] that Prior Authorization for Sacubitril -Valsartan  24-26MG  tablets  has been APPROVED from 05/26/24 to 05/25/25   PA #/Case ID/Reference #: [id number]

## 2024-05-27 NOTE — Progress Notes (Unsigned)
 " Cardiology Office Note:  .   Date:  05/27/2024  ID:  Rebecca [name], DOB 01-Apr-1971, MRN [id number] PCP: Perri Ronal PARAS, MD  [facility] Providers Cardiologist:  Gordy Bergamo, MD Electrophysiologist:  Danelle Birmingham, MD { Click to update primary MD,subspecialty MD or APP then REFRESH:1}  History of Present Illness: .   Rebecca [name] is a 54 y.o. Caucasian female initially with history of LBBB at least dating back to 2013. Past medical history includes hyperthyroidism S/P RAI therapy in March 2018, depression, DM, estrogen negative right breast cancer S/P Bilateral mastectomy and adjuvant chemotherapy(Adriamycin  and Cytoxan  x4), developed severe LV systolic dysfunction and due to persistent low EF and dyspnea underwent BV ICD implantation with Medtronic CRT-D on 04/05/2022 with improvement of LVEF to normal.     Discussed the use of AI scribe software for clinical note transcription with the patient, who gave verbal consent to proceed.  History of Present Illness    Cardiac Studies relevent.    ECHOCARDIOGRAM COMPLETE 05/27/2023  1. Left ventricular ejection fraction, by estimation, is 55 to 60%. The left ventricle has normal function. The left ventricle has no regional wall motion abnormalities. Left ventricular diastolic parameters are indeterminate. The average left ventricular global longitudinal strain is -21.3 %. The global longitudinal strain is normal.  Coronary CTA 01/01/2019: 1. Coronary artery calcium  score 50 Agatston units. This places the patient in the 97th percentile for age and gender, suggesting high risk for future cardiac events. 2.  Nonobstructive coronary disease.   EKG:      Labs   Lab Results  Component Value Date   CHOL 145 06/13/2023   HDL 74 06/13/2023   LDLCALC 57 06/13/2023   TRIG 63 06/13/2023   CHOLHDL 2.0 06/13/2023   No results found for: LIPOA  Recent Labs    10/08/23 0924 04/13/24 1000 04/16/24 0653  NA 142 138 137   K 5.0 5.2* 5.2*  CL 103 103 104  CO2 21 28 26   GLUCOSE 132* 90 127*  BUN 14 14 13   CREATININE 0.75 0.80 0.74  CALCIUM  9.5 9.6 8.6*  GFRNONAA  --  >60 >60    Lab Results  Component Value Date   ALT 9 03/10/2023   AST 11 (L) 03/10/2023   ALKPHOS 55 03/10/2023   BILITOT 0.3 03/10/2023      Latest Ref Rng & Units 04/16/2024    6:53 AM 04/13/2024   10:00 AM 03/10/2023    3:05 PM  CBC  WBC 4.0 - 10.5 K/uL 11.0  5.7  5.8   Hemoglobin 12.0 - 15.0 g/dL 88.9  86.0  87.4   Hematocrit 36.0 - 46.0 % 33.5  43.3  39.2   Platelets 150 - 400 K/uL 174  206  224    Lab Results  Component Value Date   HGBA1C 5.7 (H) 02/13/2024    Lab Results  Component Value Date   TSH 0.19 (A) 02/11/2024  Free T4        1.76.  ***ROS Physical Exam:   VS:  LMP  (LMP Unknown)    Wt Readings from Last 3 Encounters:  04/15/24 183 lb (83 kg)  04/13/24 185 lb (83.9 kg)  03/09/24 187 lb 8 oz (85 kg)    BP Readings from Last 3 Encounters:  04/16/24 102/72  04/13/24 103/74  03/09/24 109/65   ***Physical Exam  ASSESSMENT AND PLAN: .      ICD-10-CM   1. 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 Hypercholesteremia  E78.00     4. ICD (implantable cardioverter-defibrillator) in place  Z95.810      Assessment and Plan Assessment & Plan    Follow up: ***  Signed,  Gordy Bergamo, MD, [facility] 05/27/2024, 8:15 PM [facility] [address] [place], [place] [address] Phone: [phone]. Fax:  [phone]  "

## 2024-05-28 ENCOUNTER — Ambulatory Visit: Admitting: Cardiology

## 2024-05-28 DIAGNOSIS — Z95810 Presence of automatic (implantable) cardiac defibrillator: Secondary | ICD-10-CM

## 2024-05-28 DIAGNOSIS — I427 Cardiomyopathy due to drug and external agent: Secondary | ICD-10-CM

## 2024-05-28 DIAGNOSIS — E7800 Pure hypercholesterolemia, unspecified: Secondary | ICD-10-CM

## 2024-05-28 DIAGNOSIS — I5020 Unspecified systolic (congestive) heart failure: Secondary | ICD-10-CM

## 2024-05-31 ENCOUNTER — Other Ambulatory Visit (HOSPITAL_COMMUNITY): Payer: Self-pay

## 2024-05-31 ENCOUNTER — Ambulatory Visit: Admitting: Cardiology

## 2024-05-31 ENCOUNTER — Encounter: Payer: Self-pay | Admitting: Cardiology

## 2024-05-31 VITALS — BP 102/62 | HR 94 | Ht 69.0 in | Wt 187.4 lb

## 2024-05-31 DIAGNOSIS — I427 Cardiomyopathy due to drug and external agent: Secondary | ICD-10-CM

## 2024-05-31 DIAGNOSIS — E669 Obesity, unspecified: Secondary | ICD-10-CM | POA: Diagnosis not present

## 2024-05-31 DIAGNOSIS — E119 Type 2 diabetes mellitus without complications: Secondary | ICD-10-CM

## 2024-05-31 DIAGNOSIS — T451X5D Adverse effect of antineoplastic and immunosuppressive drugs, subsequent encounter: Secondary | ICD-10-CM | POA: Diagnosis not present

## 2024-05-31 DIAGNOSIS — I2510 Atherosclerotic heart disease of native coronary artery without angina pectoris: Secondary | ICD-10-CM

## 2024-05-31 DIAGNOSIS — I5022 Chronic systolic (congestive) heart failure: Secondary | ICD-10-CM

## 2024-05-31 DIAGNOSIS — I447 Left bundle-branch block, unspecified: Secondary | ICD-10-CM | POA: Diagnosis not present

## 2024-05-31 DIAGNOSIS — E7800 Pure hypercholesterolemia, unspecified: Secondary | ICD-10-CM | POA: Diagnosis not present

## 2024-05-31 MED ORDER — TIRZEPATIDE 15 MG/0.5ML ~~LOC~~ SOAJ
15.0000 mg | SUBCUTANEOUS | 3 refills | Status: AC
  Filled 2024-05-31: qty 6, 84d supply, fill #0

## 2024-05-31 NOTE — Patient Instructions (Signed)
 Medication Instructions:  Meds ordered this encounter  Medications   tirzepatide  (MOUNJARO ) 15 MG/0.5ML Pen    Sig: Inject 15 mg into the skin once a week.    Dispense:  6 mL    Refill:  3    approved through 05/15/2023    *If you need a refill on your cardiac medications before your next appointment, please call your pharmacy*   Follow-Up: At [facility], you and your health needs are our priority.  As part of our continuing mission to provide you with exceptional heart care, our providers are all part of one team.  This team includes your primary Cardiologist (physician) and Advanced Practice Providers or APPs (Physician Assistants and Nurse Practitioners) who all work together to provide you with the care you need, when you need it.  Your next appointment:   1 year(s)  Provider:   Gordy Bergamo, MD             We recommend signing up for the patient portal called MyChart.  Patients are able to view lab/test results, encounter notes, upcoming appointments, etc.  Non-urgent messages can be sent to your provider as well, go to [email or web address].

## 2024-06-01 ENCOUNTER — Ambulatory Visit: Admitting: Internal Medicine

## 2024-06-01 ENCOUNTER — Encounter: Payer: Self-pay | Admitting: Internal Medicine

## 2024-06-01 VITALS — BP 100/80 | HR 80 | Ht 69.0 in | Wt 188.0 lb

## 2024-06-01 DIAGNOSIS — F419 Anxiety disorder, unspecified: Secondary | ICD-10-CM

## 2024-06-01 DIAGNOSIS — I5022 Chronic systolic (congestive) heart failure: Secondary | ICD-10-CM | POA: Diagnosis not present

## 2024-06-01 DIAGNOSIS — E785 Hyperlipidemia, unspecified: Secondary | ICD-10-CM

## 2024-06-01 DIAGNOSIS — E039 Hypothyroidism, unspecified: Secondary | ICD-10-CM

## 2024-06-01 DIAGNOSIS — Z7984 Long term (current) use of oral hypoglycemic drugs: Secondary | ICD-10-CM

## 2024-06-01 DIAGNOSIS — I427 Cardiomyopathy due to drug and external agent: Secondary | ICD-10-CM

## 2024-06-01 DIAGNOSIS — Z853 Personal history of malignant neoplasm of breast: Secondary | ICD-10-CM

## 2024-06-01 DIAGNOSIS — Z95810 Presence of automatic (implantable) cardiac defibrillator: Secondary | ICD-10-CM

## 2024-06-01 DIAGNOSIS — E119 Type 2 diabetes mellitus without complications: Secondary | ICD-10-CM | POA: Diagnosis not present

## 2024-06-01 DIAGNOSIS — I10 Essential (primary) hypertension: Secondary | ICD-10-CM

## 2024-06-01 DIAGNOSIS — Z9889 Other specified postprocedural states: Secondary | ICD-10-CM

## 2024-06-01 DIAGNOSIS — Z6827 Body mass index (BMI) 27.0-27.9, adult: Secondary | ICD-10-CM

## 2024-06-01 NOTE — Progress Notes (Shared)
 "   Patient Care Team: Perri Ronal PARAS, MD as PCP - General (Internal Medicine) Ladona Heinz, MD as PCP - Cardiology (Cardiology) Waddell Danelle ORN, MD as PCP - Electrophysiology (Cardiology) Perri Ronal PARAS, MD (Internal Medicine)  Visit Date: 06/01/24  Subjective:    Patient ID: Rebecca [name] , Female   DOB: November 19, 1970, 54 y.o.    MRN: [id number]   54 y.o. Female presents today for 6 month follow up . Patient has a past medical history of Anxiety, Chronic systolic heart failure, Hyperlipidemia, Hypertension Diabetes Mellitus, Type II, Hypothyroidism.  On April 15, 2024 she underwent a decompression and fusion of L4-5 and L5-S1. She was having significant bouts of back pain and  lumbar radiculopathy  with spondylolisthesis at the level of L4-L5 . She had a herniated nucleus pulposus at the level of L5-S1 eccentric to the left side. Mobile spondylolisthesis of about 4 mm at L4-L5 had been getting worse with the formation of small synovial cysts on the inside of the facet joints, having failed maximal conservative therapy. She is feeling better s/p surgery and recovering well.  History of Anxiety treated with Effexor -CR 75 mg and Lorazepam  1 mg twice daily as needed.     History of Chronic systolic heart failure, has non-obstructive CAD,LBBB, is S/P BV ICD implantation followed closed by Electrophysiology. Has shortness of breath upon increased activity. Followed by Cardiologist Dr. Heinz Ladona, whom she last saw on 05/31/2024. He feels she is stable and doing well.  History of hyperlipidemia treated with Crestor  20 mg daily and Zetia  10 mg daily.    History of hypertension treated with Metoprolol  succinate 25 mg daily. Blood pressure normal today at 100/80.  History of Diabetes Mellitus, Type II treated with Mounjaro .  On 04/16/2024, her  HgbA1c was 5.7%. Current weight 188 lb BMI 27.67.   History of Hypothyroidism treated with Levothyroxine  112 mg.  Feels well on this dose.   Past  Medical History:  Diagnosis Date   Acute respiratory disease due to COVID-19 virus 12/25/2019   AICD (automatic cardioverter/defibrillator) present    Allergy    constant hives post chemo therapy.  All allergen tests have been neg.   Anxiety    Arthritis    Breast cancer (HCC)    Coronary artery disease    CRTD Medtronic COBALT XT CRT D 04/05/2022 04/05/2022   Diabetes mellitus without complication (HCC)    Diverticulitis    Encounter for assessment of implantable cardioverter-defibrillator (ICD) 04/06/2022   GERD (gastroesophageal reflux disease)    Headache    Heart failure (HCC) 01/02/2022   EF 25 to 30%   Hiatal hernia    Hyperlipidemia    Hypertension    Hypothyroidism    LBBB (left bundle branch block)    Pneumonia      Family History  Problem Relation Age of Onset   Lymphoma Father    Cancer Father        lymphoma   Diabetes Brother    Diabetes Mother    Liver disease Mother        NASH, d. 84   Asthma Mother    CAD Maternal Grandmother    CAD Maternal Grandfather    Stomach cancer Neg Hx    Colon cancer Neg Hx    Colon polyps Neg Hx    Esophageal cancer Neg Hx    Rectal cancer Neg Hx     Social History   Social History Narrative   ** Merged History Encounter **  Review of Systems  All other systems reviewed and are negative.       Objective:   Vitals: BP 100/80   Pulse 80   Ht 5' 9 (1.753 m)   Wt 188 lb (85.3 kg)   LMP  (LMP Unknown)   SpO2 97%   BMI 27.76 kg/m    Physical Exam Vitals and nursing note reviewed.     Chest is clear. Cor:RRR without ectopy or murmur. Ext without edema. Affect is normal. Feeling less stress. Children are doing well.  Results:   Studies obtained and personally reviewed by me:    Labs:       Component Value Date/Time   NA 137 04/16/2024 0653   NA 142 10/08/2023 0924   K 5.2 (H) 04/16/2024 0653   CL 104 04/16/2024 0653   CO2 26 04/16/2024 0653   GLUCOSE 127 (H) 04/16/2024 0653    BUN 13 04/16/2024 0653   BUN 14 10/08/2023 0924   CREATININE 0.74 04/16/2024 0653   CREATININE 0.92 03/10/2023 1505   CREATININE 0.91 10/18/2022 1118   CALCIUM  8.6 (L) 04/16/2024 0653   PROT 6.4 (L) 03/10/2023 1505   PROT 6.4 05/11/2020 0816   ALBUMIN  4.2 03/10/2023 1505   ALBUMIN  4.3 05/11/2020 0816   AST 11 (L) 03/10/2023 1505   ALT 9 03/10/2023 1505   ALKPHOS 55 03/10/2023 1505   BILITOT 0.3 03/10/2023 1505   GFRNONAA >60 04/16/2024 0653   GFRNONAA >60 03/10/2023 1505   GFRNONAA 83 01/12/2020 1130   GFRAA 86 05/11/2020 0816   GFRAA 97 01/12/2020 1130     Lab Results  Component Value Date   WBC 11.0 (H) 04/16/2024   HGB 11.0 (L) 04/16/2024   HCT 33.5 (L) 04/16/2024   MCV 85.2 04/16/2024   PLT 174 04/16/2024    Lab Results  Component Value Date   CHOL 145 06/13/2023   HDL 74 06/13/2023   LDLCALC 57 06/13/2023   TRIG 63 06/13/2023   CHOLHDL 2.0 06/13/2023    Lab Results  Component Value Date   HGBA1C 5.7 (H) 02/13/2024     Lab Results  Component Value Date   TSH 0.19 (A) 02/11/2024        Assessment & Plan:   On April 15, 2024 she underwent a decompression and fusion of L4-5 and L5-S1. She had significant bouts of lumbar radiculopathy and spondylolisthesis at the level of L4-L5 on two occasions. She has a herniated nucleus pulposus at the level of L5-S1 eccentric to the left side. Mobile spondylolisthesis of about 4 mm at L4-L5 had been getting worse with the formation of small synovial cysts on the inside of the facet joints, having failed maximal conservative therapy. Recovering well from surgery.  Anxiety: treated with Effexor -CR 75 mg and Lorazepam  1 mg twice daily as needed.     Chronic systolic heart failure: insertion of cardioverter-defibrillator. Has shortness of breath upon increased activity. Followed by Cardiologist Dr. Gordy Bergamo, who she last saw 05/31/2024. Has been seen by Electrophysiology as well. Lipid   Hyperlipidemia: treated with  Crestor  20 mg daily and Zetia  10 mg daily.    Hypertension: treated with Metoprolol  succinate 25 mg daily. Blood pressure normal today at 100/80.  Diabetes Mellitus, Type II: treated with Mounjaro . 04/16/2024 HgbA1c 5.7%. Current weight 188 lb BMI 27.67.   Hypothyroidism: treated with Levothyroxine  112 mg.     Will need CPE in 6 months.  I,Makayla C Reid,acting as a scribe for Ronal JINNY Hailstone, MD.,have documented  all relevant documentation on the behalf of Ronal JINNY Hailstone, MD,as directed by  Ronal JINNY Hailstone, MD while in the presence of Ronal JINNY Hailstone, MD.  I, Ronal JINNY Hailstone, MD, have reviewed all documentation for this visit. The documentation on 06/01/2024 for the exam, diagnosis, procedures, and orders are all accurate and complete.     "

## 2024-06-01 NOTE — Patient Instructions (Signed)
 It was  a pleasure to see you today. We are glad your are feeling better from back surgery. RTC in 6 onths

## 2024-06-02 ENCOUNTER — Encounter: Payer: Self-pay | Admitting: Internal Medicine

## 2024-06-03 ENCOUNTER — Other Ambulatory Visit: Payer: Self-pay

## 2024-06-03 ENCOUNTER — Encounter: Payer: Self-pay | Admitting: Internal Medicine

## 2024-06-03 DIAGNOSIS — D229 Melanocytic nevi, unspecified: Secondary | ICD-10-CM

## 2025-03-10 ENCOUNTER — Inpatient Hospital Stay: Admitting: Hematology and Oncology
